# Patient Record
Sex: Female | Born: 1950
Health system: Southern US, Community
[De-identification: ages and names within clinical notes are randomized; demographics above are authoritative.]

## PROBLEM LIST (undated history)

## (undated) DIAGNOSIS — R030 Elevated blood-pressure reading, without diagnosis of hypertension: Secondary | ICD-10-CM

## (undated) DIAGNOSIS — Z973 Presence of spectacles and contact lenses: Secondary | ICD-10-CM

## (undated) DIAGNOSIS — Z9889 Other specified postprocedural states: Secondary | ICD-10-CM

## (undated) DIAGNOSIS — D649 Anemia, unspecified: Secondary | ICD-10-CM

## (undated) DIAGNOSIS — Z87410 Personal history of cervical dysplasia: Secondary | ICD-10-CM

## (undated) DIAGNOSIS — I2510 Atherosclerotic heart disease of native coronary artery without angina pectoris: Secondary | ICD-10-CM

## (undated) DIAGNOSIS — F419 Anxiety disorder, unspecified: Secondary | ICD-10-CM

## (undated) DIAGNOSIS — M810 Age-related osteoporosis without current pathological fracture: Secondary | ICD-10-CM

## (undated) DIAGNOSIS — H9190 Unspecified hearing loss, unspecified ear: Secondary | ICD-10-CM

## (undated) DIAGNOSIS — R112 Nausea with vomiting, unspecified: Secondary | ICD-10-CM

## (undated) DIAGNOSIS — E785 Hyperlipidemia, unspecified: Secondary | ICD-10-CM

## (undated) DIAGNOSIS — C44619 Basal cell carcinoma of skin of left upper limb, including shoulder: Secondary | ICD-10-CM

## (undated) DIAGNOSIS — S329XXA Fracture of unspecified parts of lumbosacral spine and pelvis, initial encounter for closed fracture: Secondary | ICD-10-CM

## (undated) DIAGNOSIS — Z5189 Encounter for other specified aftercare: Secondary | ICD-10-CM

## (undated) DIAGNOSIS — M8580 Other specified disorders of bone density and structure, unspecified site: Secondary | ICD-10-CM

## (undated) DIAGNOSIS — N871 Moderate cervical dysplasia: Secondary | ICD-10-CM

## (undated) DIAGNOSIS — Z9081 Acquired absence of spleen: Secondary | ICD-10-CM

## (undated) DIAGNOSIS — Z85828 Personal history of other malignant neoplasm of skin: Secondary | ICD-10-CM

## (undated) DIAGNOSIS — Z8249 Family history of ischemic heart disease and other diseases of the circulatory system: Secondary | ICD-10-CM

## (undated) DIAGNOSIS — Z862 Personal history of diseases of the blood and blood-forming organs and certain disorders involving the immune mechanism: Secondary | ICD-10-CM

## (undated) DIAGNOSIS — F32A Depression, unspecified: Secondary | ICD-10-CM

## (undated) DIAGNOSIS — N870 Mild cervical dysplasia: Secondary | ICD-10-CM

## (undated) DIAGNOSIS — N2581 Secondary hyperparathyroidism of renal origin: Secondary | ICD-10-CM

## (undated) DIAGNOSIS — L309 Dermatitis, unspecified: Secondary | ICD-10-CM

## (undated) DIAGNOSIS — Z8781 Personal history of (healed) traumatic fracture: Secondary | ICD-10-CM

## (undated) DIAGNOSIS — A310 Pulmonary mycobacterial infection: Secondary | ICD-10-CM

## (undated) DIAGNOSIS — C44712 Basal cell carcinoma of skin of right lower limb, including hip: Secondary | ICD-10-CM

## (undated) DIAGNOSIS — Z8639 Personal history of other endocrine, nutritional and metabolic disease: Secondary | ICD-10-CM

## (undated) HISTORY — DX: Elevated blood-pressure reading, without diagnosis of hypertension: R03.0

## (undated) HISTORY — DX: Age-related osteoporosis without current pathological fracture: M81.0

## (undated) HISTORY — DX: Anemia, unspecified: D64.9

## (undated) HISTORY — DX: Fracture of unspecified parts of lumbosacral spine and pelvis, initial encounter for closed fracture: S32.9XXA

## (undated) HISTORY — DX: Mild cervical dysplasia: N87.0

## (undated) HISTORY — DX: Anxiety disorder, unspecified: F41.9

## (undated) HISTORY — DX: Other specified disorders of bone density and structure, unspecified site: M85.80

## (undated) HISTORY — DX: Basal cell carcinoma of skin of left upper limb, including shoulder: C44.619

## (undated) HISTORY — DX: Pulmonary mycobacterial infection: A31.0

## (undated) HISTORY — PX: FRACTURE SURGERY: SHX138

## (undated) HISTORY — PX: BASAL CELL CARCINOMA EXCISION: SHX1214

## (undated) HISTORY — DX: Encounter for other specified aftercare: Z51.89

## (undated) HISTORY — DX: Atherosclerotic heart disease of native coronary artery without angina pectoris: I25.10

## (undated) HISTORY — DX: Hyperlipidemia, unspecified: E78.5

## (undated) HISTORY — DX: Secondary hyperparathyroidism of renal origin: N25.81

## (undated) HISTORY — DX: Basal cell carcinoma of skin of right lower limb, including hip: C44.712

---
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
## 1989-02-24 HISTORY — PX: MENISCUS REPAIR: SHX5179

## 1989-02-24 HISTORY — PX: KNEE SURGERY: SHX244

## 1993-02-24 HISTORY — PX: TUBAL LIGATION: SHX77

## 2003-10-05 ENCOUNTER — Other Ambulatory Visit: Admission: RE | Admit: 2003-10-05 | Discharge: 2003-10-05 | Payer: Self-pay | Admitting: Obstetrics and Gynecology

## 2004-01-17 ENCOUNTER — Encounter: Admission: RE | Admit: 2004-01-17 | Discharge: 2004-01-17 | Payer: Self-pay | Admitting: Obstetrics and Gynecology

## 2004-10-17 ENCOUNTER — Other Ambulatory Visit: Admission: RE | Admit: 2004-10-17 | Discharge: 2004-10-17 | Payer: Self-pay | Admitting: Obstetrics and Gynecology

## 2005-02-21 ENCOUNTER — Encounter: Admission: RE | Admit: 2005-02-21 | Discharge: 2005-02-21 | Payer: Self-pay | Admitting: Obstetrics and Gynecology

## 2005-10-24 ENCOUNTER — Other Ambulatory Visit: Admission: RE | Admit: 2005-10-24 | Discharge: 2005-10-24 | Payer: Self-pay | Admitting: Obstetrics & Gynecology

## 2006-03-13 ENCOUNTER — Encounter: Admission: RE | Admit: 2006-03-13 | Discharge: 2006-03-13 | Payer: Self-pay | Admitting: Obstetrics and Gynecology

## 2006-11-13 ENCOUNTER — Other Ambulatory Visit: Admission: RE | Admit: 2006-11-13 | Discharge: 2006-11-13 | Payer: Self-pay | Admitting: Obstetrics and Gynecology

## 2007-03-16 ENCOUNTER — Encounter: Admission: RE | Admit: 2007-03-16 | Discharge: 2007-03-16 | Payer: Self-pay | Admitting: Obstetrics and Gynecology

## 2007-11-19 ENCOUNTER — Other Ambulatory Visit: Admission: RE | Admit: 2007-11-19 | Discharge: 2007-11-19 | Payer: Self-pay | Admitting: Obstetrics and Gynecology

## 2008-03-17 ENCOUNTER — Encounter: Admission: RE | Admit: 2008-03-17 | Discharge: 2008-03-17 | Payer: Self-pay | Admitting: Obstetrics and Gynecology

## 2009-03-23 ENCOUNTER — Encounter: Admission: RE | Admit: 2009-03-23 | Discharge: 2009-03-23 | Payer: Self-pay | Admitting: Obstetrics and Gynecology

## 2010-03-25 ENCOUNTER — Encounter
Admission: RE | Admit: 2010-03-25 | Discharge: 2010-03-25 | Payer: Self-pay | Source: Home / Self Care | Attending: Obstetrics and Gynecology | Admitting: Obstetrics and Gynecology

## 2011-02-25 DIAGNOSIS — Z87410 Personal history of cervical dysplasia: Secondary | ICD-10-CM

## 2011-02-25 DIAGNOSIS — Z8781 Personal history of (healed) traumatic fracture: Secondary | ICD-10-CM

## 2011-02-25 HISTORY — DX: Personal history of cervical dysplasia: Z87.410

## 2011-02-25 HISTORY — DX: Personal history of (healed) traumatic fracture: Z87.81

## 2011-03-14 ENCOUNTER — Other Ambulatory Visit: Payer: Self-pay | Admitting: Obstetrics and Gynecology

## 2011-03-14 DIAGNOSIS — Z1231 Encounter for screening mammogram for malignant neoplasm of breast: Secondary | ICD-10-CM

## 2011-04-15 ENCOUNTER — Ambulatory Visit
Admission: RE | Admit: 2011-04-15 | Discharge: 2011-04-15 | Disposition: A | Discharge status: Home / Self Care | Payer: 59 | Source: Ambulatory Visit | Attending: Obstetrics and Gynecology | Admitting: Obstetrics and Gynecology

## 2011-04-15 DIAGNOSIS — Z1231 Encounter for screening mammogram for malignant neoplasm of breast: Secondary | ICD-10-CM

## 2011-07-19 ENCOUNTER — Emergency Department (HOSPITAL_COMMUNITY): Payer: 59

## 2011-07-19 ENCOUNTER — Emergency Department (HOSPITAL_COMMUNITY)
Admission: EM | Admit: 2011-07-19 | Discharge: 2011-07-19 | Disposition: A | Discharge status: Home / Self Care | Payer: 59 | Attending: Emergency Medicine | Admitting: Emergency Medicine

## 2011-07-19 ENCOUNTER — Encounter (HOSPITAL_COMMUNITY): Payer: Self-pay | Admitting: Emergency Medicine

## 2011-07-19 DIAGNOSIS — S32810A Multiple fractures of pelvis with stable disruption of pelvic ring, initial encounter for closed fracture: Secondary | ICD-10-CM | POA: Insufficient documentation

## 2011-07-19 DIAGNOSIS — M25559 Pain in unspecified hip: Secondary | ICD-10-CM | POA: Insufficient documentation

## 2011-07-19 DIAGNOSIS — R29898 Other symptoms and signs involving the musculoskeletal system: Secondary | ICD-10-CM | POA: Insufficient documentation

## 2011-07-19 DIAGNOSIS — S3282XA Multiple fractures of pelvis without disruption of pelvic ring, initial encounter for closed fracture: Secondary | ICD-10-CM

## 2011-07-19 DIAGNOSIS — W010XXA Fall on same level from slipping, tripping and stumbling without subsequent striking against object, initial encounter: Secondary | ICD-10-CM | POA: Insufficient documentation

## 2011-07-19 DIAGNOSIS — M79609 Pain in unspecified limb: Secondary | ICD-10-CM | POA: Insufficient documentation

## 2011-07-19 MED ORDER — HYDROCODONE-ACETAMINOPHEN 5-325 MG PO TABS: 1.0000 | ORAL_TABLET | Freq: Four times a day (QID) | ORAL | Status: AC | PRN

## 2011-07-19 MED ORDER — HYDROCODONE-ACETAMINOPHEN 5-325 MG PO TABS
1.0000 | ORAL_TABLET | Freq: Once | ORAL | Status: AC
  Administered 2011-07-19: 1 via ORAL

## 2011-07-19 MED ORDER — HYDROCODONE-ACETAMINOPHEN 5-325 MG PO TABS
ORAL_TABLET | ORAL | Status: AC
  Filled 2011-07-19: qty 1

## 2011-07-19 MED ORDER — LORAZEPAM 1 MG PO TABS
1.0000 mg | ORAL_TABLET | Freq: Once | ORAL | Status: AC
  Administered 2011-07-19: 1 mg via ORAL
  Filled 2011-07-19: qty 1

## 2011-07-19 NOTE — ED Notes (Signed)
EMS brings pt from home, pt fell last Friday pt states she was walking and tripped over the feet of the rocking chair, pt states she pulled her right groin, pt called PMD who called in a prescription for Flexeril and instructed her to take 800 mg of Ibuprofen. Pt has not been able to walk down stairs since fall secondary to pain. Pt states her right leg is still weak and painful with ambulation.

## 2011-07-19 NOTE — ED Notes (Signed)
MD at bedside. 

## 2011-07-19 NOTE — ED Notes (Signed)
[id number]<AV> Expected date:<BR> Expected time: 1:39 PM<BR> Means of arrival:<BR> Comments:<BR> M31 - 66yoF Fall, side pain

## 2011-07-19 NOTE — ED Notes (Signed)
Followed up with [facility].  They had not yet recived the patient's information. Information resubmitted, and w/c to arrive here in the ED. Spoke with Lurena Joiner at [phone]

## 2011-07-19 NOTE — ED Provider Notes (Signed)
History     CSN: [id number]  Arrival date & time 07/19/11  1351   First MD Initiated Contact with Patient 07/19/11 1458      Chief Complaint  Patient presents with  . Fall    (Consider location/radiation/quality/duration/timing/severity/associated sxs/prior treatment) HPI The patient presents one week after a fall with persistent pain and weakness.  She notes that she slipped, fell into a splits-like position.  She initially felt pain in her most prominently ascending through her midline to her head, lightheadedness.  Since that time she said pain prominently in her mid anterior pelvis, with radiation down the right anterior medial thigh.  The pain is worse with motion, ambulation.  She notes that her pain has diminished minimally with OTC medication and ice.  She continues to be uncomfortable.  No fevers, chills, dysuria, confusion, disorientation, incontinence, bowel habit changes No past medical history on file.  Past Surgical History  Procedure Date  . Splenectomy, total     Family History  Problem Relation Age of Onset  . Heart failure Father     History  Substance Use Topics  . Smoking status: Former Games developer  . Smokeless tobacco: Never Used  . Alcohol Use: Not on file     per month    OB History    Grav Para Term Preterm Abortions TAB SAB Ect Mult Living                  Review of Systems  All other systems reviewed and are negative.    Allergies  Sulfa antibiotics and Penicillins  Home Medications   Current Outpatient Rx  Name Route Sig Dispense Refill  . VITAMIN D 1000 UNITS PO TABS Oral Take 1,000 Units by mouth daily.    Marland Kitchen CLONAZEPAM 0.5 MG PO TABS Oral Take 0.5 mg by mouth 2 (two) times daily as needed. Panic attacks    . CYCLOBENZAPRINE HCL 5 MG PO TABS Oral Take 5 mg by mouth 3 (three) times daily as needed. Muscle spasms    . IBUPROFEN 200 MG PO TABS Oral Take 800 mg by mouth every 8 (eight) hours as needed. pain    . NAPROXEN SODIUM 220 MG PO  TABS Oral Take 220 mg by mouth 2 (two) times daily with a meal.      BP 178/83  Pulse 87  Temp 98.7 F (37.1 C)  SpO2 97%  Physical Exam  Nursing note and vitals reviewed. Constitutional: She is oriented to person, place, and time. She appears well-developed and well-nourished. No distress.  HENT:  Head: Normocephalic and atraumatic.  Eyes: Conjunctivae and EOM are normal.  Cardiovascular: Normal rate and regular rhythm.   Pulmonary/Chest: Effort normal and breath sounds normal. No stridor. No respiratory distress.  Abdominal: She exhibits no distension.  Musculoskeletal: She exhibits no edema.       Legs:      Patient can minimally elevate both legs, w no strength against resistance.  Hip extension is less profoundly affected, but is weak.  Knees / ankles are 5/5 in all dimensions.  Neurological: She is alert and oriented to person, place, and time. No cranial nerve deficit.  Skin: Skin is warm and dry.  Psychiatric: She has a normal mood and affect.    ED Course  Procedures (including critical care time)  Labs Reviewed - No data to display No results found.   No diagnosis found.    MDM  This 60 year old female presents following a fall of ongoing lower  extremity pain.  Patient has minimal capacity to flex either hip, but is otherwise neurovascularly intact.  Range of motion is limited notably 2 to pain in the pelvis with both extremities.  The patient has no other focal neuro changes.  The patient's radiographic studies demonstrate 5 fractures within the pelvis.  I discussed the findings with our orthopedist on call.  Given the significance of her fractures, although no operative management is currently indicated, she does require a wheelchair on discharge.  She was discharged in stable condition following a prolonged discussion on home care  Gerhard Munch, MD 07/19/11 1819

## 2012-06-04 ENCOUNTER — Encounter: Payer: Self-pay | Admitting: Obstetrics & Gynecology

## 2012-06-07 ENCOUNTER — Encounter: Payer: Self-pay | Admitting: Obstetrics & Gynecology

## 2012-06-07 ENCOUNTER — Ambulatory Visit (INDEPENDENT_AMBULATORY_CARE_PROVIDER_SITE_OTHER): Payer: 59 | Admitting: Obstetrics & Gynecology

## 2012-06-07 VITALS — BP 142/86 | Ht 64.0 in | Wt 163.0 lb

## 2012-06-07 DIAGNOSIS — M810 Age-related osteoporosis without current pathological fracture: Secondary | ICD-10-CM

## 2012-06-07 DIAGNOSIS — Z0000 Encounter for general adult medical examination without abnormal findings: Secondary | ICD-10-CM

## 2012-06-07 DIAGNOSIS — Z01419 Encounter for gynecological examination (general) (routine) without abnormal findings: Secondary | ICD-10-CM

## 2012-06-07 NOTE — Patient Instructions (Signed)

## 2012-06-07 NOTE — Progress Notes (Signed)
62 y.o. G3P3000 WidowedCaucasianF here for annual exam.  No vaginal bleeding.  Dating same person--together six years.  He has AML and now has liver metastasis and is in clinical trial in [place], [place].  Questions about doing yearly MMG, BMD, and Paps.  She doesn't want to be on much medication for the osteoporosis in her spine.  Has not had a colonoscopy.  Feeling very anxious today.  Had six bowel movements today.  Willing to do occult testing for blood.  Still declines colonoscopy despite discussion about importance today.  No LMP recorded. Patient is postmenopausal.          Sexually active: yes/no partner is dealing with medical issues The current method of family planning is vasectomy.    Exercising: yes  aerobic weight lifting 2-3/wk Smoker:  no Alcohol: Drinks 1x/month  Health Maintenance: Pap:  11/05/11 LGSIL and +HR HPV MMG:  04/15/11 Colonoscopy:  none BMD:   02/2010 TDaP:  2010 Labs: will do today    reports that she has quit smoking. She has never used smokeless tobacco. She reports that she drinks about 1.2 ounces of alcohol per week. She reports that she does not use illicit drugs.  Past Medical History  Diagnosis Date  . Pelvic fracture     from fall  . Osteopenia   . Dysplasia of cervix, low grade (CIN 1) 05/08/11    Past Surgical History  Procedure Laterality Date  . Splenectomy, total  1954    due to spherocytosis  . Tubal ligation  1995  . Meniscus repair Right 1991    Current Outpatient Prescriptions  Medication Sig Dispense Refill  . cholecalciferol (VITAMIN D) 1000 UNITS tablet Take 1,000 Units by mouth daily.      . clonazePAM (KLONOPIN) 0.5 MG tablet Take 0.5 mg by mouth 2 (two) times daily as needed. Panic attacks       No current facility-administered medications for this visit.    Family History  Problem Relation Age of Onset  . Heart failure Father   . Skin cancer Father   . Hypertension Father   . Osteoporosis Mother     ROS:  Pertinent  items are noted in HPI.  Otherwise, a comprehensive ROS was negative.  Exam:   BP 142/86  Ht 5\' 4"  (1.626 m)  Wt 163 lb (73.936 kg)  BMI 27.97 kg/m2  Height:   Height: 5\' 4"  (162.6 cm)  Ht Readings from Last 3 Encounters:  06/07/12 5\' 4"  (1.626 m)    General appearance: alert, cooperative and appears stated age Head: Normocephalic, without obvious abnormality, atraumatic Neck: no adenopathy, supple, symmetrical, trachea midline and thyroid normal to inspection and palpation Lungs: clear to auscultation bilaterally Breasts: normal appearance, no masses or tenderness Heart: regular rate and rhythm Abdomen: soft, non-tender; bowel sounds normal; no masses,  no organomegaly Extremities: extremities normal, atraumatic, no cyanosis or edema Skin: Skin color, texture, turgor normal. No rashes or lesions Lymph nodes: Cervical, supraclavicular, and axillary nodes normal. No abnormal inguinal nodes palpated Neurologic: Grossly normal   Pelvic: External genitalia:  no lesions              Urethra:  normal appearing urethra with no masses, tenderness or lesions              Bartholins and Skenes: normal                 Vagina: normal appearing vagina with normal color and discharge, no lesions  Cervix: no lesions              Pap taken: yes Bimanual Exam:  Uterus:  normal size, contour, position, consistency, mobility, non-tender              Adnexa: no mass, fullness, tenderness               Rectovaginal: Confirms               Anus:  normal sphincter tone, no lesions  A:  Well Woman with normal exam H/O LGSIL pap with +HR HPV Osteoporosis Anxiety H/O elevated parathyroid hormone level   P:   MMG and BMD this year--pt to schedule Pap and HR HPV today. Order for Zostavax given oday.  She will do at local pharmacy Vitamin D, Lipids, CMP, U/A, Parathyroid hormone.  Patient will return for these, fasting. OC light given.  Declines colonoscopy today.  An After Visit  Summary was printed and given to the patient.

## 2012-06-11 LAB — IPS PAP TEST WITH HPV

## 2012-06-15 NOTE — Addendum Note (Signed)
Addended by: Jerene Bears on: 06/15/2012 12:57 PM   Modules accepted: Orders

## 2012-06-17 ENCOUNTER — Ambulatory Visit (INDEPENDENT_AMBULATORY_CARE_PROVIDER_SITE_OTHER): Payer: 59 | Admitting: Obstetrics & Gynecology

## 2012-06-17 ENCOUNTER — Other Ambulatory Visit: Payer: 59

## 2012-06-17 VITALS — BP 122/82 | HR 64 | Resp 16

## 2012-06-17 DIAGNOSIS — Z0000 Encounter for general adult medical examination without abnormal findings: Secondary | ICD-10-CM

## 2012-06-17 DIAGNOSIS — R87612 Low grade squamous intraepithelial lesion on cytologic smear of cervix (LGSIL): Secondary | ICD-10-CM

## 2012-06-17 DIAGNOSIS — M810 Age-related osteoporosis without current pathological fracture: Secondary | ICD-10-CM

## 2012-06-17 DIAGNOSIS — R87810 Cervical high risk human papillomavirus (HPV) DNA test positive: Secondary | ICD-10-CM

## 2012-06-17 LAB — POCT URINALYSIS DIPSTICK
Bilirubin, UA: NEGATIVE
Nitrite, UA: NEGATIVE
Protein, UA: NEGATIVE
Urobilinogen, UA: NEGATIVE
pH, UA: 5

## 2012-06-17 NOTE — Progress Notes (Signed)
62 y.o. WidowedCaucasianfemale here for colposcopy. Pap done on 06/07/12 showed LSGIL and HR HPV.  Colpo one year ago showed CIN I with biopsy.  Patient's last menstrual period was 02/24/2002.  Contraception:  postmenopausal status   Blood pressure 122/82, pulse 64, resp. rate 16, last menstrual period 02/24/2002.  Procedure explained and patient's questions were invited and answered.  Consent form signed.    Speculum inserted atraumatically and cervix visualized.  3% acetic acid applied.  Cervix examined using 7.5 and 15x magnification and green filter.  Lugol's solution also applied with no abnormal staining.  Gross appearance:normal.  Squamocolumnar junction seen in entirety: yes, just endocervical.  No abnormal lesions noted.  ECC obtained.  Patient tolerated procedure well.   Assessment:  LGSIL, H/O CIN I, +HR HPV  Plan:  Will base further treatment on Pathology findings.

## 2012-06-17 NOTE — Patient Instructions (Signed)

## 2012-06-18 LAB — LIPID PANEL
Cholesterol: 189 mg/dL (ref 0–200)
HDL: 72 mg/dL (ref >39)
LDL Cholesterol: 103 mg/dL — ABNORMAL HIGH (ref 0–99)
Total CHOL/HDL Ratio: 2.6 Ratio
Triglycerides: 68 mg/dL (ref <150)
VLDL: 14 mg/dL (ref 0–40)

## 2012-06-19 LAB — VITAMIN D 25 HYDROXY (VIT D DEFICIENCY, FRACTURES): Vit D, 25-Hydroxy: 26 ng/mL — ABNORMAL LOW (ref 30–89)

## 2012-06-21 LAB — PTH, INTACT AND CALCIUM
Calcium, Total (PTH): 9.7 mg/dL (ref 8.4–10.5)
PTH: 70.2 pg/mL (ref 14.0–72.0)

## 2012-06-24 ENCOUNTER — Telehealth: Payer: Self-pay | Admitting: Obstetrics & Gynecology

## 2012-06-24 NOTE — Telephone Encounter (Signed)
Returning call to Kelly

## 2012-06-28 ENCOUNTER — Encounter: Payer: Self-pay | Admitting: Obstetrics & Gynecology

## 2012-06-28 NOTE — Telephone Encounter (Signed)
RETURNING A CALL TO KELLY

## 2012-08-17 ENCOUNTER — Other Ambulatory Visit: Payer: Self-pay

## 2012-08-17 DIAGNOSIS — Z1231 Encounter for screening mammogram for malignant neoplasm of breast: Secondary | ICD-10-CM

## 2012-08-25 ENCOUNTER — Ambulatory Visit: Payer: 59

## 2012-09-03 ENCOUNTER — Ambulatory Visit: Payer: 59

## 2012-09-07 ENCOUNTER — Telehealth: Payer: Self-pay | Admitting: Obstetrics & Gynecology

## 2012-09-07 NOTE — Telephone Encounter (Signed)
Patient is asking for an order to be faxed to [facility] for bmd and mmg.

## 2012-09-08 NOTE — Telephone Encounter (Signed)
Both are fine.

## 2012-09-08 NOTE — Telephone Encounter (Signed)
Patient last AEX 06/07/2012 per EPIC/ DR. Hyacinth Wilkerson Last Diagnostic Bilateral Mammogram  04/15/2011 @ [facility]  Last BMD 03/25/2010 @ [facility] for recheck in 2 years Patient requesting order to [facility] for BMD and Diagnostic Mammogram Please advise. Chart in your office if needed

## 2012-09-09 ENCOUNTER — Other Ambulatory Visit: Payer: Self-pay | Admitting: Orthopedic Surgery

## 2012-09-09 DIAGNOSIS — Z780 Asymptomatic menopausal state: Secondary | ICD-10-CM

## 2012-09-09 NOTE — Telephone Encounter (Signed)
Spoke with pt about order for BMD placed at [facility], and that her last MMG recommended just a screening MMG for this year. Pt can call and schedule at her convenience. Pt agreeable.

## 2012-09-24 ENCOUNTER — Ambulatory Visit
Admission: RE | Admit: 2012-09-24 | Discharge: 2012-09-24 | Disposition: A | Discharge status: Home / Self Care | Payer: 59 | Source: Ambulatory Visit | Attending: Obstetrics and Gynecology | Admitting: Obstetrics and Gynecology

## 2012-09-24 ENCOUNTER — Ambulatory Visit
Admission: RE | Admit: 2012-09-24 | Discharge: 2012-09-24 | Disposition: A | Discharge status: Home / Self Care | Payer: 59 | Source: Ambulatory Visit

## 2012-09-24 DIAGNOSIS — Z1231 Encounter for screening mammogram for malignant neoplasm of breast: Secondary | ICD-10-CM

## 2012-09-24 DIAGNOSIS — Z780 Asymptomatic menopausal state: Secondary | ICD-10-CM

## 2012-10-08 ENCOUNTER — Telehealth: Payer: Self-pay | Admitting: *Deleted

## 2012-10-08 NOTE — Telephone Encounter (Signed)
Left message on CB# of need to call office concerning bone density report and medication Bonivia.

## 2012-10-11 ENCOUNTER — Telehealth: Payer: Self-pay | Admitting: Obstetrics & Gynecology

## 2012-10-11 NOTE — Telephone Encounter (Signed)
Spoke with pt about BMD results. Pt would like for SM to review her results and decide what to do next re: Boniva. Pt will be in all day meetings for the next 3 days (Tues, Wed, and Thurs), but we can leave a detailed message on her phone.

## 2012-10-11 NOTE — Telephone Encounter (Signed)
Patient's Dr. Is Dr. Hyacinth Meeker would like someone that is with Dr. Hyacinth Meeker to call her back concerning her Bone density.

## 2012-10-11 NOTE — Telephone Encounter (Signed)
Left message on CB#VM of need to call office. Left message that Patty Grubb,FNP, reviewed her BMD results for 09/24/2012 and had question concerning the need for Boniva.

## 2012-10-15 NOTE — Telephone Encounter (Signed)
Please have her come in for OV.  She will have lots of questions.  OV with me please.

## 2012-10-18 ENCOUNTER — Telehealth: Payer: Self-pay | Admitting: Obstetrics & Gynecology

## 2012-10-18 NOTE — Telephone Encounter (Signed)
Patient states she spoke toDr Hyacinth Meeker personally and appt 12-03-12 for AEX.

## 2012-10-18 NOTE — Telephone Encounter (Signed)
Discussed results with patient.  Options discussed with patient.  She would like to recheck BMD 2 years.  Agreeable with plan.

## 2012-12-03 ENCOUNTER — Ambulatory Visit (INDEPENDENT_AMBULATORY_CARE_PROVIDER_SITE_OTHER): Payer: 59 | Admitting: Obstetrics & Gynecology

## 2012-12-03 VITALS — BP 142/80 | HR 64 | Resp 16 | Ht 64.0 in | Wt 158.2 lb

## 2012-12-03 DIAGNOSIS — IMO0002 Reserved for concepts with insufficient information to code with codable children: Secondary | ICD-10-CM

## 2012-12-03 DIAGNOSIS — R6889 Other general symptoms and signs: Secondary | ICD-10-CM

## 2012-12-03 DIAGNOSIS — Z800 Family history of malignant neoplasm of digestive organs: Secondary | ICD-10-CM

## 2012-12-03 NOTE — Patient Instructions (Signed)
AZO standard (OTC) three times a day for three days.  If you see bright orange on your mini-pad, it is urine.

## 2012-12-08 ENCOUNTER — Telehealth: Payer: Self-pay | Admitting: Obstetrics & Gynecology

## 2012-12-08 ENCOUNTER — Encounter: Payer: Self-pay | Admitting: Obstetrics & Gynecology

## 2012-12-08 DIAGNOSIS — IMO0002 Reserved for concepts with insufficient information to code with codable children: Secondary | ICD-10-CM | POA: Insufficient documentation

## 2012-12-08 NOTE — Progress Notes (Signed)
Patient ID: CHAPEL SILVERTHORN, female   DOB: January 07, 1951, 62 y.o.   MRN: [id number]  62 yo G3P3 WWF here for "checking in".  She has hx of abnormal Pap smears that I have followed a little over a year.  Tested positive last year for HR HPV.  Had AEX 4/14 and pap, again, was abnormal showing LGSIL. Colposcopy was performed 4/24.  No abnormalities were noted.  ECC was negative.  One year follow-up planned.  After colposcopy, I had a lengthy discussion on the phone with pt.  We reviewed all current guidelines.  Although she was ok with waiting a year, she wanted to check in in six months to make sure nothing had changed in regards to recommendations.  Nothing has changed and I reviewed all pap smears and pathology with pt.  We discussed if there continued to be abnormality next year, especially with +HR HPV, it would be appropriate to proceed with excision of TZ of cervix to decrease HPV volume and hopefully to see resolution of abnormal Pap smear.  Patient has to think and get her self ready and isn't sure yet if she will want that.  Also wants to tell me mother died with metastatic colon cancer.  Pt has refused colonoscopy in the past but thinks she will want one next year.  Encouraged pt to proceed with testing for stool in blood.  She is ok with this.  Stool card given today.  I really encouraged her to consider doing colonoscopy earlier.  She just isn't "mentally ready yet'.  Lastly, pt wants to talk about dark yellowish, scant discharge that she sees in underwear at end of day.  Is sticky and she thinks could be leakage of urine.  I am doubtful.  Advised typical PMP discharge that is seen.  Informed her to try AZO standard for 3 days to see if sees dark orange in underwear.  If so, she needs to let me know.  Declines exam today.  Assessment:  H/o abnormal Pap smear, H/o + HRHPV, mother with hx of colon cancer, PMP discharge  Plan:  Pap with AEX next year.  Appt already scheduled.  Stool card given and  colonoscopy encouraged.  ~30 minutes spent with patient >50% of time was in face to face discussion of above.

## 2012-12-08 NOTE — Telephone Encounter (Signed)
Patient calling for recent bone density results please?

## 2012-12-08 NOTE — Telephone Encounter (Addendum)
Dr. Hyacinth Meeker, patient is calling for results. Dexa in August showed osteoporosis, Dr. Hyacinth Meeker spoke with patient at that time but patient states that they forgot to discuss at recent appointment. She wanted to know actual numbers. T and Z scores given. Patient verbalized understanding and will f/u prn.

## 2013-03-09 ENCOUNTER — Telehealth: Payer: Self-pay | Admitting: Obstetrics & Gynecology

## 2013-03-09 NOTE — Telephone Encounter (Signed)
Routing to Dr. Miller

## 2013-03-09 NOTE — Telephone Encounter (Signed)
Patient was in in October to see [facility] for a consult and she told her a doctors name for a colonoscopy she doesn't remember the name of the doctor. She said "it was the one her(Dr miller) parents went to"

## 2013-03-09 NOTE — Telephone Encounter (Signed)
Dr. Collene Mares.  We will probably need to make a referral for her but if she wants to call and find out first, that is fine.

## 2013-03-10 NOTE — Telephone Encounter (Signed)
Return call to patient, VM has name confirmation. LM of Dr Collene Mares name and phone number.  Encounter closed.

## 2013-03-16 ENCOUNTER — Encounter: Payer: Self-pay | Admitting: Obstetrics & Gynecology

## 2013-06-08 ENCOUNTER — Telehealth: Payer: Self-pay | Admitting: Obstetrics & Gynecology

## 2013-06-08 NOTE — Telephone Encounter (Signed)
Pt says she is anticipating that she will need a colpo after her aex with dr Sabra Heck. Pt says she has new insurance and trying to have that done as soon as possible.

## 2013-06-08 NOTE — Telephone Encounter (Signed)
Dr. Sabra Heck, patient is currently covered by two health insurance policies until the 98XQ of this Month. She would like to come in earlier for AEX as soon as possible to have another pap (she understands this may not be covered under primary insurance as she has already had an annual this year) so that it is resulted and she will know earlier if she needs a colposcopy and so she can plan to have colposcopy prior to lapse of benefits.  There are no open aex appointments for you at this time. Patient has seen Milford Cage, [place] before on 04/2011. Will it be okay with you to schedule aex for earlier with Patty?

## 2013-06-09 ENCOUNTER — Ambulatory Visit (INDEPENDENT_AMBULATORY_CARE_PROVIDER_SITE_OTHER): Payer: 59 | Admitting: Obstetrics & Gynecology

## 2013-06-09 ENCOUNTER — Encounter: Payer: Self-pay | Admitting: Obstetrics & Gynecology

## 2013-06-09 VITALS — Ht 63.75 in | Wt 159.0 lb

## 2013-06-09 DIAGNOSIS — IMO0002 Reserved for concepts with insufficient information to code with codable children: Secondary | ICD-10-CM

## 2013-06-09 DIAGNOSIS — R899 Unspecified abnormal finding in specimens from other organs, systems and tissues: Secondary | ICD-10-CM

## 2013-06-09 DIAGNOSIS — Z0000 Encounter for general adult medical examination without abnormal findings: Secondary | ICD-10-CM

## 2013-06-09 DIAGNOSIS — R6889 Other general symptoms and signs: Secondary | ICD-10-CM

## 2013-06-09 DIAGNOSIS — Z01419 Encounter for gynecological examination (general) (routine) without abnormal findings: Secondary | ICD-10-CM

## 2013-06-09 LAB — HEPATIC FUNCTION PANEL
ALBUMIN: 4.5 g/dL (ref 3.5–5.2)
ALK PHOS: 78 U/L (ref 39–117)
ALT: 27 U/L (ref 0–35)
AST: 23 U/L (ref 0–37)
Bilirubin, Direct: 0.2 mg/dL (ref 0.0–0.3)
Indirect Bilirubin: 0.6 mg/dL (ref 0.2–1.2)
TOTAL PROTEIN: 7.2 g/dL (ref 6.0–8.3)
Total Bilirubin: 0.8 mg/dL (ref 0.2–1.2)

## 2013-06-09 LAB — POCT URINALYSIS DIPSTICK
Bilirubin, UA: NEGATIVE
Glucose, UA: NEGATIVE
Ketones, UA: NEGATIVE
NITRITE UA: NEGATIVE
UROBILINOGEN UA: NEGATIVE
pH, UA: 6

## 2013-06-09 NOTE — Progress Notes (Signed)
63 y.o. [id number] WidowedCaucasianF here for annual exam.  No vaginal bleeding.    Patient's last menstrual period was 02/24/2002.          Sexually active: no  The current method of family planning is tubal ligation.    Exercising: yes   Smoker:  no  Health Maintenance: Pap:  06/07/12-LGSIL, 06/17/12-Colposcopy History of abnormal Pap:  yes MMG:  09/24/12-normal Colonoscopy:  05/23/13-Repeat in 5 years BMD:   09/24/12, -1.8/-1.8 TDaP:  Up to date with PCP Dr Percell Shivan Hodes at [facility]: PCP, Hb today: PCP and with GI, Urine today: WBC-1+, PROTEIN-trace, PH-6, RBC-trace   reports that she has never smoked. She has never used smokeless tobacco. She reports that she drinks about 1.2 ounces of alcohol per week. She reports that she does not use illicit drugs.  Past Medical History  Diagnosis Date  . Pelvic fracture     from fall  . Osteopenia   . Dysplasia of cervix, low grade (CIN 1) 05/08/11  . Hyperparathyroidism, secondary     due to excess Vit D, normalized off Vit D, never treated    Past Surgical History  Procedure Laterality Date  . Splenectomy, total  1954    due to spherocytosis  . Tubal ligation  1995  . Meniscus repair Right 1991    Current Outpatient Prescriptions  Medication Sig Dispense Refill  . Emollient (NEOSALUS) CREA       . halobetasol (ULTRAVATE) 0.05 % ointment Apply 1 application topically 2 (two) times daily. prn      . clonazePAM (KLONOPIN) 0.5 MG tablet Take 0.5 mg by mouth 2 (two) times daily as needed. Panic attacks       No current facility-administered medications for this visit.    Family History  Problem Relation Age of Onset  . Heart failure Father   . Skin cancer Father   . Hypertension Father   . Osteoporosis Mother   . Colon cancer Mother     deceased    ROS:  Pertinent items are noted in HPI.  Otherwise, a comprehensive ROS was negative.  Exam:   Ht 5' 3.75" (1.619 m)  Wt 159 lb (72.122 kg)  BMI 27.52 kg/m2  LMP 02/24/2002     Height: 5' 3.75" (161.9 cm)  Ht Readings from Last 3 Encounters:  06/09/13 5' 3.75" (1.619 m)  12/03/12 5\' 4"  (1.626 m)  06/07/12 5\' 4"  (1.626 m)    General appearance: alert, cooperative and appears stated age Head: Normocephalic, without obvious abnormality, atraumatic Neck: no adenopathy, supple, symmetrical, trachea midline and thyroid normal to inspection and palpation Lungs: clear to auscultation bilaterally Breasts: normal appearance, no masses or tenderness Heart: regular rate and rhythm Abdomen: soft, non-tender; bowel sounds normal; no masses,  no organomegaly Extremities: extremities normal, atraumatic, no cyanosis or edema Skin: Skin color, texture, turgor normal. No rashes or lesions Lymph nodes: Cervical, supraclavicular, and axillary nodes normal. No abnormal inguinal nodes palpated Neurologic: Grossly normal   Pelvic: External genitalia:  no lesions              Urethra:  normal appearing urethra with no masses, tenderness or lesions              Bartholins and Skenes: normal                 Vagina: normal appearing vagina with normal color and discharge, no lesions              Cervix: no  lesions              Pap taken: yes Bimanual Exam:  Uterus:  normal size, contour, position, consistency, mobility, non-tender              Adnexa: normal adnexa and no mass, fullness, tenderness               Rectovaginal: Confirms               Anus:  normal sphincter tone, no lesions  A:  Well Woman with normal exam PMP, no HRT H/O LGSIL with +HR HPV H/O family hx of colon cancer, mother Osteopororsis.  Stable over 8 years.  Declines treatment.  P:   Mammogram yearly pap smear with HR HPV today Labs with PCP CMP return annually or prn  An After Visit Summary was printed and given to the patient.

## 2013-06-09 NOTE — Patient Instructions (Signed)

## 2013-06-09 NOTE — Telephone Encounter (Signed)
Spoke with patient. Patient is agreeable to come in for appointment today at 3:30 with Dr.Miller. Appointment scheduled. Patient states that she has not yet chosen benefit package for new insurance but knows that coverage began on day one of her starting new job. Patient is still covered with previous insurance until end of month. Advised to bring old insurance information as well as any new information from new insurance. Patient agreeable and verbalizes understanding.  Routing to provider for final review. Patient agreeable to disposition. Will close encounter

## 2013-06-09 NOTE — Telephone Encounter (Signed)
Call her and see if she can come this afternoon at 3:30pm.  Thanks.

## 2013-06-15 LAB — IPS PAP TEST WITH HPV

## 2013-06-17 ENCOUNTER — Ambulatory Visit: Payer: 59 | Admitting: Obstetrics & Gynecology

## 2013-06-20 ENCOUNTER — Telehealth: Payer: Self-pay | Admitting: Emergency Medicine

## 2013-06-20 DIAGNOSIS — R87810 Cervical high risk human papillomavirus (HPV) DNA test positive: Secondary | ICD-10-CM

## 2013-06-20 NOTE — Telephone Encounter (Signed)
Spoke with patient. Message from Dr. Sabra Heck given. Patient was planning on colposcopy for 4/28 at 1400 (this was her prior AEX appointment) as she currently has two insurance coverage's at this time and wants to have procedure. Advised that pap smear was normal but HPV remains positive, will need colposcopy for further evaluation.   Patient requests appointment confirmation and instructions to be left as detailed message as she is going into a meeting at this time.   Colposcopy pre-procedure instructions given. Motrin instructions given. Motrin=Advil=Ibuprofen Can take 800 mg (Can purchase over the counter, you will need four 200 mg pills).  Take with food. Make sure to eat a meal before appointment and drink plenty of fluids. Patient verbalized understanding and will call to reschedule if will be on menses or has any concerns regarding pregnancy. Advised will need to cancel within 24 hours or will have $100.00 no show fee placed to account.   Order sent for precert.

## 2013-06-20 NOTE — Telephone Encounter (Signed)
Colposcopy ordered for precert.  Post menopausal.  Pap normal, HR HPV postive.   Message left to return call to [place] at [phone].

## 2013-06-20 NOTE — Telephone Encounter (Signed)
Message copied by Michele Mcalpine on Mon Jun 20, 2013 10:23 AM ------      Message from: Megan Salon      Created: Wed Jun 15, 2013  3:59 PM       Inform pt pap is normal.  This is an improvement over the last pap but her HR HPV is still +.  She needs a colpo again this year. ------

## 2013-06-21 ENCOUNTER — Ambulatory Visit: Payer: Managed Care, Other (non HMO) | Admitting: Obstetrics & Gynecology

## 2013-06-21 ENCOUNTER — Ambulatory Visit: Payer: 59 | Admitting: Obstetrics & Gynecology

## 2013-06-21 ENCOUNTER — Telehealth: Payer: Self-pay | Admitting: *Deleted

## 2013-06-21 NOTE — Telephone Encounter (Signed)
Patient arrived for colpo procedure and had insurance issue. Appointment rescheduled to 06-23-13 to allow patient time to provide documentation of new primary coverage.  Colpo 06-23-13 at 0915.  Routing to provider for final review. Will close encounter

## 2013-06-23 ENCOUNTER — Ambulatory Visit (INDEPENDENT_AMBULATORY_CARE_PROVIDER_SITE_OTHER): Payer: Managed Care, Other (non HMO) | Admitting: Obstetrics & Gynecology

## 2013-06-23 VITALS — BP 128/78 | HR 64 | Resp 16 | Ht 63.75 in | Wt 158.8 lb

## 2013-06-23 DIAGNOSIS — R6889 Other general symptoms and signs: Secondary | ICD-10-CM

## 2013-06-23 DIAGNOSIS — IMO0002 Reserved for concepts with insufficient information to code with codable children: Secondary | ICD-10-CM

## 2013-06-23 DIAGNOSIS — B977 Papillomavirus as the cause of diseases classified elsewhere: Secondary | ICD-10-CM

## 2013-06-23 NOTE — Patient Instructions (Signed)

## 2013-06-23 NOTE — Progress Notes (Signed)
Subjective:     Patient ID: Alicia Wilkerson, female   DOB: December 21, 1950, 63 y.o.   MRN: [id number]  HPI 63 yo G2P3 here for colposcopy.  LGSIL/+HR HPV pap 3/13, 4/14.  Colposcopy with biopsies showed CIN 1.  Follow-up pap was neg this year but HR HPV was positive.  Here for repeat colposcopy.   Review of Systems  All other systems reviewed and are negative.      Objective:   Physical Exam  Constitutional: She is oriented to person, place, and time. She appears well-developed and well-nourished.  Genitourinary: Vagina normal.    Neurological: She is alert and oriented to person, place, and time.  Skin: Skin is warm and dry.  Psychiatric: She has a normal mood and affect.       Assessment:    H/O + HR HPV in pt with normal pap.     Plan:     ECC pending.  If negative, pt will need cotesting in one year.  She is comfortable with this and advised me to leave a very detailed message for her with results when they come back.

## 2013-06-27 LAB — IPS OTHER TISSUE BIOPSY

## 2013-11-03 ENCOUNTER — Other Ambulatory Visit: Payer: Self-pay

## 2013-11-03 DIAGNOSIS — Z1231 Encounter for screening mammogram for malignant neoplasm of breast: Secondary | ICD-10-CM

## 2013-11-17 ENCOUNTER — Encounter (INDEPENDENT_AMBULATORY_CARE_PROVIDER_SITE_OTHER): Payer: Self-pay

## 2013-11-17 ENCOUNTER — Ambulatory Visit
Admission: RE | Admit: 2013-11-17 | Discharge: 2013-11-17 | Disposition: A | Discharge status: Home / Self Care | Payer: Managed Care, Other (non HMO) | Source: Ambulatory Visit

## 2013-11-17 DIAGNOSIS — Z1231 Encounter for screening mammogram for malignant neoplasm of breast: Secondary | ICD-10-CM

## 2013-12-26 ENCOUNTER — Encounter: Payer: Self-pay | Admitting: Obstetrics & Gynecology

## 2014-04-17 ENCOUNTER — Telehealth: Payer: Self-pay | Admitting: Obstetrics & Gynecology

## 2014-04-28 ENCOUNTER — Ambulatory Visit (INDEPENDENT_AMBULATORY_CARE_PROVIDER_SITE_OTHER): Payer: BLUE CROSS/BLUE SHIELD | Admitting: Obstetrics & Gynecology

## 2014-04-28 ENCOUNTER — Encounter: Payer: Self-pay | Admitting: Obstetrics & Gynecology

## 2014-04-28 VITALS — BP 156/82 | HR 60 | Resp 16 | Ht 63.75 in | Wt 163.6 lb

## 2014-04-28 DIAGNOSIS — R899 Unspecified abnormal finding in specimens from other organs, systems and tissues: Secondary | ICD-10-CM

## 2014-04-28 DIAGNOSIS — Z124 Encounter for screening for malignant neoplasm of cervix: Secondary | ICD-10-CM

## 2014-04-28 DIAGNOSIS — Z0000 Encounter for general adult medical examination without abnormal findings: Secondary | ICD-10-CM

## 2014-04-28 DIAGNOSIS — Z01419 Encounter for gynecological examination (general) (routine) without abnormal findings: Secondary | ICD-10-CM

## 2014-04-28 LAB — COMPREHENSIVE METABOLIC PANEL
ALT: 48 U/L — AB (ref 0–35)
AST: 39 U/L — ABNORMAL HIGH (ref 0–37)
Albumin: 4.5 g/dL (ref 3.5–5.2)
Alkaline Phosphatase: 81 U/L (ref 39–117)
BUN: 15 mg/dL (ref 6–23)
CALCIUM: 9.7 mg/dL (ref 8.4–10.5)
CHLORIDE: 102 meq/L (ref 96–112)
CO2: 24 mEq/L (ref 19–32)
Creat: 0.76 mg/dL (ref 0.50–1.10)
GLUCOSE: 86 mg/dL (ref 70–99)
Potassium: 4.3 mEq/L (ref 3.5–5.3)
Sodium: 142 mEq/L (ref 135–145)
TOTAL PROTEIN: 7 g/dL (ref 6.0–8.3)
Total Bilirubin: 1.1 mg/dL (ref 0.2–1.2)

## 2014-04-28 LAB — POCT URINALYSIS DIPSTICK
BILIRUBIN UA: NEGATIVE
GLUCOSE UA: NEGATIVE
Ketones, UA: NEGATIVE
Nitrite, UA: NEGATIVE
UROBILINOGEN UA: NEGATIVE
pH, UA: 5

## 2014-04-28 LAB — LIPID PANEL
Cholesterol: 187 mg/dL (ref 0–200)
HDL: 84 mg/dL (ref >=46)
LDL CALC: 92 mg/dL (ref 0–99)
TRIGLYCERIDES: 57 mg/dL (ref <150)
Total CHOL/HDL Ratio: 2.2 Ratio
VLDL: 11 mg/dL (ref 0–40)

## 2014-04-28 LAB — HEMOGLOBIN, FINGERSTICK: Hemoglobin, fingerstick: 13.3 g/dL (ref 12.0–16.0)

## 2014-04-28 NOTE — Progress Notes (Signed)
64 y.o. [id number] WidowedCaucasianF here for annual exam.  No vaginal bleeding.  Doing well.    Did have a single sexual partner this year.  She is not worried about STD exposure.    Patient's last menstrual period was 02/24/2002.          Sexually active: No.  The current method of family planning is tubal ligation.    Exercising: Yes.    weights, cardio, and walking-when time allows Smoker:  no  Health Maintenance: Pap:  06/09/13 WNL/postitive HR HPV, ECC-negative 06/23/13 History of abnormal Pap:  yes MMG:  11/17/13-normal Colonoscopy:  05/23/13-repeat in 5 years BMD:   09/24/12.  Osteoporosis.  Pt declines therapy and we have decided not to repeat this year. TDaP:  2010 Screening Labs: today, Hb today: 13.3, Urine today: WBC-trace, PROTEIN-trace, RBC-trace   reports that she has never smoked. She has never used smokeless tobacco. She reports that she drinks alcohol. She reports that she does not use illicit drugs.  Past Medical History  Diagnosis Date  . Pelvic fracture     from fall  . Osteopenia   . Dysplasia of cervix, low grade (CIN 1) 05/08/11  . Hyperparathyroidism, secondary     due to excess Vit D, normalized off Vit D, never treated    Past Surgical History  Procedure Laterality Date  . Splenectomy, total  1954    due to spherocytosis  . Tubal ligation  1995  . Meniscus repair Right 1991    Current Outpatient Prescriptions  Medication Sig Dispense Refill  . clonazePAM (KLONOPIN) 0.5 MG tablet Take 0.5 mg by mouth 2 (two) times daily as needed. Panic attacks    . Emollient (NEOSALUS) CREA     . halobetasol (ULTRAVATE) 0.05 % ointment Apply 1 application topically 2 (two) times daily. prn     No current facility-administered medications for this visit.    Family History  Problem Relation Age of Onset  . Heart failure Father   . Skin cancer Father   . Hypertension Father   . Osteoporosis Mother   . Colon cancer Mother     deceased    ROS:  Pertinent items  are noted in HPI.  Otherwise, a comprehensive ROS was negative.  Exam:   BP 156/82 mmHg  Pulse 60  Resp 16  Ht 5' 3.75" (1.619 m)  Wt 163 lb 9.6 oz (74.208 kg)  BMI 28.31 kg/m2  LMP 02/24/2002   Height: 5' 3.75" (161.9 cm)  Ht Readings from Last 3 Encounters:  04/28/14 5' 3.75" (1.619 m)  06/23/13 5' 3.75" (1.619 m)  06/09/13 5' 3.75" (1.619 m)    General appearance: alert, cooperative and appears stated age Head: Normocephalic, without obvious abnormality, atraumatic Neck: no adenopathy, supple, symmetrical, trachea midline and thyroid normal to inspection and palpation Lungs: clear to auscultation bilaterally Breasts: normal appearance, no masses or tenderness Heart: regular rate and rhythm Abdomen: soft, non-tender; bowel sounds normal; no masses,  no organomegaly Extremities: extremities normal, atraumatic, no cyanosis or edema Skin: Skin color, texture, turgor normal. No rashes or lesions Lymph nodes: Cervical, supraclavicular, and axillary nodes normal. No abnormal inguinal nodes palpated Neurologic: Grossly normal   Pelvic: External genitalia:  no lesions              Urethra:  normal appearing urethra with no masses, tenderness or lesions              Bartholins and Skenes: normal  Vagina: normal appearing vagina with normal color and discharge, no lesions              Cervix: no lesions              Pap taken: Yes.   Bimanual Exam:  Uterus:  normal size, contour, position, consistency, mobility, non-tender              Adnexa: normal adnexa and no mass, fullness, tenderness               Rectovaginal: Confirms               Anus:  normal sphincter tone, no lesions  Chaperone was present for exam.  A:  Well Woman with normal exam PMP, no HRT H/O LGSIL with +HR HPV H/O family hx of colon cancer, mother Osteopororsis. Stable over 8 years. Declines treatment.  Will not repeat BMD this year.   P: Mammogram yearly pap smear with HR HPV  today CMP, TSH, Vit D, Lipids (pt had coffee with a little cream this morning) Plan BMD next year return annually or prn

## 2014-04-29 LAB — VITAMIN D 25 HYDROXY (VIT D DEFICIENCY, FRACTURES): Vit D, 25-Hydroxy: 25 ng/mL — ABNORMAL LOW (ref 30–100)

## 2014-04-29 LAB — TSH: TSH: 1.144 u[IU]/mL (ref 0.350–4.500)

## 2014-05-01 NOTE — Addendum Note (Signed)
Addended by: Megan Salon on: 05/01/2014 09:31 PM   Modules accepted: Orders, SmartSet

## 2014-05-02 LAB — IPS PAP TEST WITH HPV

## 2014-05-08 ENCOUNTER — Telehealth: Payer: Self-pay

## 2014-05-08 NOTE — Telephone Encounter (Signed)
Pt returning call.  Ok to leave a detailed message.

## 2014-05-08 NOTE — Telephone Encounter (Signed)
-----   Message from Megan Salon, MD sent at 05/04/2014  5:59 PM EST ----- Inform pap showed ASCUS cells but HR HPV was negative.  OK to repeat cotest 1 year--Pap and RH HPV.  08 recall.

## 2014-05-08 NOTE — Telephone Encounter (Signed)
Lmtcb//kn 

## 2014-05-09 NOTE — Telephone Encounter (Signed)
Spoke with patient. Phone call passed from [facility] as patient had further questions about pap results. "I have had an abnormal pap before with HPV. Now there is not HPV detected. I want to know is this something that is like chicken pox and shingles? Do I need to worry about cancer cells?" Advised patient that her pap has negative HPV. Needs follow up with pap and HPV testing in one year. Advised at this time HPV is negative but I can not guarantee that it will be negative forever as she has had a positive HPV in the past. Advised this is why we will monitor her again in a year. Advised no signs of cancer at this time. Patient is agreeable and verbalizes understanding.

## 2014-05-09 NOTE — Telephone Encounter (Signed)
Patient notified of all results-see result note//kn

## 2014-10-13 ENCOUNTER — Telehealth: Payer: Self-pay | Admitting: *Deleted

## 2014-10-13 NOTE — Telephone Encounter (Signed)
Pt is aware of abnormality and of recommendation for follow up.  Non-compliance is noted.  Ok to close encounter.

## 2014-10-13 NOTE — Telephone Encounter (Signed)
Dr. Sabra Heck, I called patient and she declined coming back to have her labs rechecked. She said she felt fine and did not want to come all the way here from [place]. She said her work schedule was  too full as well. -eh

## 2014-10-13 NOTE — Telephone Encounter (Signed)
-----   Message from Megan Salon, MD sent at 10/11/2014  2:13 PM EDT ----- Regarding: repeat liver fxn tests Mrs. Swilling hasn't called back to scheduled repeat lab test.  Can you call to schedule this?  Thanks.  Order is in [place].  MSM

## 2014-11-09 ENCOUNTER — Telehealth: Payer: Self-pay | Admitting: Obstetrics & Gynecology

## 2014-11-09 ENCOUNTER — Other Ambulatory Visit: Payer: Self-pay

## 2014-11-09 DIAGNOSIS — E2839 Other primary ovarian failure: Secondary | ICD-10-CM

## 2014-11-09 DIAGNOSIS — Z780 Asymptomatic menopausal state: Secondary | ICD-10-CM

## 2014-11-09 DIAGNOSIS — Z1231 Encounter for screening mammogram for malignant neoplasm of breast: Secondary | ICD-10-CM

## 2014-11-09 NOTE — Telephone Encounter (Signed)
Spoke with patient. Advised last BMD was performed on 09/24/2012. Advised is due for another BMD at this time. Patient is agreeable and will call to schedule. Order for BMD sent to the [facility] for patient.  Routing to provider for final review. Patient agreeable to disposition. Will close encounter.

## 2014-11-09 NOTE — Telephone Encounter (Signed)
Patient is going to schedule her MMG soon and is wondering if she is due for her BMD also. Last seen 04/28/14.

## 2014-11-09 NOTE — Telephone Encounter (Signed)
Left message to call Kaitlyn at [phone]. 

## 2015-01-05 ENCOUNTER — Ambulatory Visit
Admission: RE | Admit: 2015-01-05 | Discharge: 2015-01-05 | Disposition: A | Discharge status: Home / Self Care | Payer: BLUE CROSS/BLUE SHIELD | Source: Ambulatory Visit | Attending: Obstetrics & Gynecology | Admitting: Obstetrics & Gynecology

## 2015-01-05 ENCOUNTER — Ambulatory Visit
Admission: RE | Admit: 2015-01-05 | Discharge: 2015-01-05 | Disposition: A | Discharge status: Home / Self Care | Payer: BLUE CROSS/BLUE SHIELD | Source: Ambulatory Visit

## 2015-01-05 DIAGNOSIS — E2839 Other primary ovarian failure: Secondary | ICD-10-CM

## 2015-01-05 DIAGNOSIS — Z780 Asymptomatic menopausal state: Secondary | ICD-10-CM

## 2015-01-05 DIAGNOSIS — Z1231 Encounter for screening mammogram for malignant neoplasm of breast: Secondary | ICD-10-CM

## 2015-01-08 ENCOUNTER — Telehealth: Payer: Self-pay | Admitting: *Deleted

## 2015-01-08 NOTE — Telephone Encounter (Signed)
-----   Message from Megan Salon, MD sent at 01/05/2015  5:38 PM EST ----- Please inform pt that her BMD continues to show osteoporosis in her spine.  t score -3.1 but was -3.2 with last test.  Hip still shows osteopenia.  Was -1.6 with this test and -1.8 two years ago.  She doesn't want treatment and as is stable, ok to monitor.  Repeat 2 years.

## 2015-01-08 NOTE — Telephone Encounter (Signed)
I spoke with patient and she is aware of her BMD results. She does not want treatment at this time. -eh

## 2015-01-08 NOTE — Telephone Encounter (Signed)
[place] in regards to BMD results -eh

## 2015-03-06 ENCOUNTER — Ambulatory Visit (INDEPENDENT_AMBULATORY_CARE_PROVIDER_SITE_OTHER): Payer: BLUE CROSS/BLUE SHIELD | Admitting: Physician Assistant

## 2015-03-06 ENCOUNTER — Ambulatory Visit (INDEPENDENT_AMBULATORY_CARE_PROVIDER_SITE_OTHER): Payer: BLUE CROSS/BLUE SHIELD

## 2015-03-06 VITALS — BP 124/68 | HR 95 | Temp 98.2°F | Resp 18

## 2015-03-06 DIAGNOSIS — M25572 Pain in left ankle and joints of left foot: Secondary | ICD-10-CM | POA: Diagnosis not present

## 2015-03-06 DIAGNOSIS — S82892A Other fracture of left lower leg, initial encounter for closed fracture: Secondary | ICD-10-CM

## 2015-03-06 DIAGNOSIS — L309 Dermatitis, unspecified: Secondary | ICD-10-CM

## 2015-03-06 MED ORDER — NEOSALUS EX CREA: 1.0000 "application " | TOPICAL_CREAM | Freq: Every day | CUTANEOUS | Status: DC | PRN

## 2015-03-06 MED ORDER — HALOBETASOL PROPIONATE 0.05 % EX OINT: 1.0000 "application " | TOPICAL_OINTMENT | Freq: Two times a day (BID) | CUTANEOUS | Status: DC

## 2015-03-06 MED ORDER — TRAMADOL HCL 50 MG PO TABS: 50.0000 mg | ORAL_TABLET | Freq: Three times a day (TID) | ORAL | Status: DC | PRN

## 2015-03-06 NOTE — Patient Instructions (Addendum)
[facility] in [place]  No weight on the ankle at all.  Show up at [facility] to see Dr Doran Durand 03/07/2015  [facility] will hopefully set up with a wheelchair and bedside commode.

## 2015-03-06 NOTE — Progress Notes (Addendum)
[facility] consulted to assist with dme.  Patient presented to Urgent Care center post fall sustaining injury to left ankle.  EDCM spoke to PA Weber who reports patient lives alone.  Patient's son unable to stay with patient, but patient's boyfriend will be able to stay with patient.  Patient has paid her neighbor to take her to the urgent care center and will be taking patient home.  PA has placed order for dme wheelchair and 3 in 1 commode for patient at home.  EDCM called and spoke to Homer at [facility] asking if wheelchair and 3 in 1 can be delivered to [facility] this evening before 8pm.  Ysidro Evert reports he is not sure but will send out to on call.  [facility] faxed over order for 3 in 1 commode and wheelchair to [facility] with confirmation of receipt.  EDCM has not heard from on call from [facility], so [facility] called Nichola Sizer of [employer]  [phone] who will deliver wheelchair to patient's home this evening.  EDCM provided Aaron Edelman with patient's phone number.  Aaron Edelman reports he does not have any three in one commodes at this time.  EDCM will call patient to inform her of this.  03/06/2015 A. Raghav Verrilli RNCM 1936pm EDCM called and spoke to patient. Patient reports she was on her way to the pharmacy to pick up some medications.  Neighbor is taking her home.  EDCM informed patient that Aaron Edelman of [employer] will be calling her this evening to deliver her a wheelchair this evening.  EDCM informed patient that [facility] does not have 3 in 1 commodes at this time.  EDCM offered to fax out referral for commode to another home health agency, but patient refused as she reports she will not be able to empty it.  Patient reports with the wheelchair she will be able to to be mobile, get to the microwave and sink and bathroom, "I will manage." Patient reports she has a walker at home.  Patient reports her neighbor will be taking her to her appointment at [facility] orthopedic.  Patient voicing concerns of how she will be able to manage at  home, as she lives by herself.  EDCM instructed patient to voice her concerns at her appointment tomorrow, as they can arrange for home health services or may need short term rehab post surgery.  Patient verbalized understanding.  No further EDCM needs at this time.  03/06/2015 A. Christen Bame RNCM [id number] [facility] sent inbox message to Dr. Doran Durand regarding patient concerns.  03/06/2015 A. Christen Bame [facility] [id number]  Patient confirms she has received wheelchair delivered at home.

## 2015-03-06 NOTE — Progress Notes (Signed)
Alicia Wilkerson  MRN: [id number] DOB: [birth year]  Subjective:  Pt presents to clinic after a fall yesterday with left ankle pain and swelling.  She slipped on the ice and had immediate pain and swelling.  She tried to put weight on the foot but it was to painful.  She has been crawling around her house and elevating her leg and icing it.  She has tried some Aleve for the pain and that has helped a little.  She has no numbness or pain in her toes.  Patient Active Problem List   Diagnosis Date Noted  . Abnormal Pap smear 12/08/2012    Current Outpatient Prescriptions on File Prior to Visit  Medication Sig Dispense Refill  . clonazePAM (KLONOPIN) 0.5 MG tablet Take 0.5 mg by mouth 2 (two) times daily as needed. Panic attacks     No current facility-administered medications on file prior to visit.    Allergies  Allergen Reactions  . Sulfa Antibiotics Hives  . Penicillins Rash    Review of Systems  Musculoskeletal: Positive for gait problem (unable to walk).   Objective:  BP 124/68 mmHg  Pulse 95  Temp(Src) 98.2 F (36.8 C) (Oral)  Resp 18  SpO2 98%  LMP 02/24/2002  Physical Exam  Constitutional: She is oriented to person, place, and time and well-developed, well-nourished, and in no distress.  HENT:  Head: Normocephalic and atraumatic.  Right Ear: Hearing and external ear normal.  Left Ear: Hearing and external ear normal.  Eyes: Conjunctivae are normal.  Neck: Normal range of motion.  Pulmonary/Chest: Effort normal.  Musculoskeletal:       Left ankle: She exhibits decreased range of motion, swelling and ecchymosis. She exhibits normal pulse. Tenderness: all of ankle - worse lateral.  Good DP pulse and capillary refill and patient has good sensation in the foot distal to the fracture  Neurological: She is alert and oriented to person, place, and time. Gait normal.  Skin: Skin is warm and dry.  Psychiatric: Mood, memory, affect and judgment normal.  Vitals  reviewed.  [facility] reading (PRIMARY) by  Dr. Linna Darner.  Bimalleolar fracture that has a uneven joint space in ankle.  Multiple fragments.  Assessment and Plan :  Left ankle pain - Plan: DG Ankle Complete Left, traMADol (ULTRAM) 50 MG tablet - pt was unable to tolerate a camwalker so a custom fiberglass posterior leg splint was applied  Eczema - Plan: halobetasol (ULTRAVATE) 0.05 % ointment, Dermatological Products, Misc. (NEOSALUS) CREA - pt has h/o hand eczema and would like a refill of her medications while she is here  Ankle fracture, left - Plan: DME Wheelchair manual, Ambulatory referral to Orthopedic Surgery, For home use only DME 3 n 1 - this is a complicated unstable fracture and she will f/u with ortho tomorrow for a work-in appointment with Dr Suzan Slick to Amy at [facility] nurse case manager - who was very helpful in getting this patient set up with [facility] for home services of a wheelchair and bedside toilet.  She will go to [facility] ortho in the am for a visit with Dr Doran Durand for evaluation.  She needs to speak with them regarding care at home because she lives at home in a 2 story home.  I spoke with Dr Alvan Dame who stated she will be non-weight bearing for 6-8 weeks - she was placed in a posterior splint because we could not manipulate for foot into a camwalker - her foot is in mild extension and  position of comfort (pt states it feels better once in the splint).  She will elevate and ice and knows not to walk on her foot.  D/w Dr Nyra Jabs PA-C  [facility] 03/06/2015 9:33 PM

## 2015-03-07 ENCOUNTER — Telehealth: Payer: Self-pay | Admitting: General Practice

## 2015-03-14 HISTORY — PX: ANKLE SURGERY: SHX546

## 2015-03-14 HISTORY — PX: ORIF ANKLE FRACTURE: SUR919

## 2015-03-28 ENCOUNTER — Telehealth: Payer: Self-pay | Admitting: Obstetrics & Gynecology

## 2015-03-28 NOTE — Telephone Encounter (Signed)
I called [facility] imaging billing department. Stated that order was processed under M81.0 Age related osteporosis. I advised that this patient does not have a diagnosis of osteporosis but she has a history of osteopenia.  She states that without bone density coded as a screening it will apply to deductible as a medical benefit and not screening. Patient has a HSA [employer] account and final invoice amount [id number].

## 2015-03-28 NOTE — Telephone Encounter (Signed)
Patient is asking to talk a nurse regarding the coding for her recent BMD. Patient said the BMD was coded incorrectly and has never had to pay for her BMD. Patient is asking that the Dx  code could be corrected .

## 2015-03-29 NOTE — Telephone Encounter (Signed)
OK to send in screening code for this but do not remove the osteoporosis code because she does have osteoporosis.  Please let pt know this was coded the exact same way last year so this may be how her insurance processes this.  Thank you.

## 2015-03-29 NOTE — Telephone Encounter (Signed)
Routing to Dr. Sabra Heck to review and advise.  Patient has a history of osteopenia. Can order be changed to screening for osteoporosis?

## 2015-03-30 NOTE — Telephone Encounter (Signed)
Patient called for update.  Advised of message from Dr. Sabra Heck and patient states she is aware that it was coded as the same as previous years, she does have a new [employer].  Call to [place] and added Z13.820 to order for screening for osteoporosis.   Update to patient that claim will be reprocessed with screening added.   Patient agreeable.  Will close encounter.

## 2015-07-03 ENCOUNTER — Telehealth: Payer: Self-pay | Admitting: Obstetrics & Gynecology

## 2015-07-03 NOTE — Telephone Encounter (Signed)
Please call patient about her appointment for 07/06/15. Patient states this is not correct and can only come on a Friday morning with Dr. Sabra Heck.

## 2015-07-04 NOTE — Telephone Encounter (Signed)
Spoke with patient at time of incoming call. Patient states that she was advised her appointment was on 06/29/2015. Came to our office last week and our office was closed due to unexpected maintenance. Patient is scheduled to have her aex with Dr.Miller on 07/06/2015 and reports this date will not work well for her. Would like to reschedule. "I can not wait until her next appointment in October. I need an appointment in June or July." Appointment rescheduled to 08/17/2015 at 8:30 am. Patient is agreeable to date and time.  Routing to provider for final review. Patient agreeable to disposition. Will close encounter.

## 2015-07-06 ENCOUNTER — Ambulatory Visit: Payer: BLUE CROSS/BLUE SHIELD | Admitting: Obstetrics & Gynecology

## 2015-08-17 ENCOUNTER — Encounter: Payer: Self-pay | Admitting: Obstetrics & Gynecology

## 2015-08-17 ENCOUNTER — Ambulatory Visit (INDEPENDENT_AMBULATORY_CARE_PROVIDER_SITE_OTHER): Payer: BLUE CROSS/BLUE SHIELD | Admitting: Obstetrics & Gynecology

## 2015-08-17 VITALS — BP 104/60 | HR 60 | Resp 14 | Ht 63.5 in | Wt 153.8 lb

## 2015-08-17 DIAGNOSIS — R748 Abnormal levels of other serum enzymes: Secondary | ICD-10-CM

## 2015-08-17 DIAGNOSIS — Z124 Encounter for screening for malignant neoplasm of cervix: Secondary | ICD-10-CM | POA: Diagnosis not present

## 2015-08-17 DIAGNOSIS — Z0000 Encounter for general adult medical examination without abnormal findings: Secondary | ICD-10-CM | POA: Diagnosis not present

## 2015-08-17 DIAGNOSIS — Z01419 Encounter for gynecological examination (general) (routine) without abnormal findings: Secondary | ICD-10-CM

## 2015-08-17 LAB — HEPATIC FUNCTION PANEL
ALBUMIN: 4.4 g/dL (ref 3.6–5.1)
ALK PHOS: 98 U/L (ref 33–130)
ALT: 20 U/L (ref 6–29)
AST: 22 U/L (ref 10–35)
BILIRUBIN TOTAL: 1.2 mg/dL (ref 0.2–1.2)
Bilirubin, Direct: 0.2 mg/dL (ref <=0.2)
Indirect Bilirubin: 1 mg/dL (ref 0.2–1.2)
TOTAL PROTEIN: 7.1 g/dL (ref 6.1–8.1)

## 2015-08-17 LAB — CBC
HCT: 37.7 % (ref 35.0–45.0)
Hemoglobin: 12.6 g/dL (ref 11.7–15.5)
MCH: 32.6 pg (ref 27.0–33.0)
MCHC: 33.4 g/dL (ref 32.0–36.0)
MCV: 97.7 fL (ref 80.0–100.0)
MPV: 11.2 fL (ref 7.5–12.5)
Platelets: 380 10*3/uL (ref 140–400)
RBC: 3.86 MIL/uL (ref 3.80–5.10)
RDW: 15.2 % — ABNORMAL HIGH (ref 11.0–15.0)
WBC: 7.1 10*3/uL (ref 3.8–10.8)

## 2015-08-17 MED ORDER — CLONAZEPAM 0.5 MG PO TABS: 0.5000 mg | ORAL_TABLET | Freq: Every day | ORAL | Status: DC

## 2015-08-17 NOTE — Addendum Note (Signed)
Addended by: Megan Salon on: 08/17/2015 09:12 AM   Modules accepted: Orders, SmartSet

## 2015-08-17 NOTE — Progress Notes (Signed)
65 y.o. [id number] WidowedCaucasianF here for annual exam.  Doing well.  Denies vaginal bleeding.  Broke her ankle in January.  Has osteoporosis.  Continues to decline treatment.  PCP:  Carlos Levering.    No LMP recorded.          Sexually active: No.  The current method of family planning is post menopausal status.    Exercising: Yes.    walking, biking, stretching, weights Smoker:  no  Health Maintenance: Pap:  04/28/2014 ASCUS, HR HPV negative History of abnormal Pap:  yes MMG:  01/08/2015 BIRADS 1 negative  Colonoscopy:  05/23/2013 normal, repeat 5 years BMD:   01/05/2015 osteoporosis  TDaP:  2010  Pneumonia vaccine(s):  Not started Zostavax:   Up to date Hep C testing: discuss with provider Screening Labs: discuss with provider, Hb today: discuss with provider, Urine today: discuss with provider, unable to void at this time   reports that she has never smoked. She has never used smokeless tobacco. She reports that she drinks alcohol. She reports that she does not use illicit drugs.  Past Medical History  Diagnosis Date  . Pelvic fracture (HCC)     from fall  . Osteopenia   . Dysplasia of cervix, low grade (CIN 1) 05/08/11  . Hyperparathyroidism, secondary ([place])     due to excess Vit D, normalized off Vit D, never treated  . Anemia   . Anxiety   . Blood transfusion without reported diagnosis   . Osteoporosis     Past Surgical History  Procedure Laterality Date  . Splenectomy, total  1954    due to spherocytosis  . Tubal ligation  1995  . Meniscus repair Right 1991  . Fracture surgery      Current Outpatient Prescriptions  Medication Sig Dispense Refill  . clonazePAM (KLONOPIN) 0.5 MG tablet Take 0.5 mg by mouth 2 (two) times daily as needed. Panic attacks    . Dermatological Products, Misc. (NEOSALUS) CREA Apply 1 application topically daily as needed. Reported on 03/06/2015 100 g 1  . halobetasol (ULTRAVATE) 0.05 % ointment Apply 1 application topically 2 (two) times  daily. Reported on 03/06/2015 15 g 0  . traMADol (ULTRAM) 50 MG tablet Take 1 tablet (50 mg total) by mouth every 8 (eight) hours as needed. 30 tablet 0   No current facility-administered medications for this visit.    Family History  Problem Relation Age of Onset  . Heart failure Father   . Skin cancer Father   . Hypertension Father   . Osteoporosis Mother   . Colon cancer Mother     deceased  . Heart disease Sister     ROS:  Pertinent items are noted in HPI.  Otherwise, a comprehensive ROS was negative.  Exam:   Filed Vitals:   08/17/15 0833  BP: 104/60  Pulse: 60  Resp: 14  Height: 5' 3.5" (1.613 m)  Weight: 153 lb 12.8 oz (69.763 kg)    General appearance: alert, cooperative and appears stated age Head: Normocephalic, without obvious abnormality, atraumatic Neck: no adenopathy, supple, symmetrical, trachea midline and thyroid normal to inspection and palpation Lungs: clear to auscultation bilaterally Breasts: normal appearance, no masses or tenderness Heart: regular rate and rhythm Abdomen: soft, non-tender; bowel sounds normal; no masses,  no organomegaly Extremities: extremities normal, atraumatic, no cyanosis or edema Skin: Skin color, texture, turgor normal. No rashes or lesions Lymph nodes: Cervical, supraclavicular, and axillary nodes normal. No abnormal inguinal nodes palpated Neurologic: Grossly normal  Pelvic: External genitalia:  no lesions              Urethra:  normal appearing urethra with no masses, tenderness or lesions              Bartholins and Skenes: normal                 Vagina: normal appearing vagina with normal color and discharge, no lesions              Cervix: no lesions              Pap taken: Yes.   Bimanual Exam:  Uterus:  normal size, contour, position, consistency, mobility, non-tender              Adnexa: normal adnexa and no mass, fullness, tenderness               Rectovaginal: Confirms               Anus:  normal sphincter  tone, no lesions  Chaperone was present for exam.  A:  Well Woman with normal exam PMP, no HRT H/O LGSIL with +HR HPV H/O family hx of colon cancer, mother Osteopororsis. Ankle fracture in January.    P: Mammogram yearly pap smear with HR HPV today Klonopin 0.5mg  po qd.  #30/1RF.  Pt will have new PCP take care of this. Liver enzymes and Hep C antibody today Declines treatment for osteoporosis.  Currently declines follow-up BMD at this time return annually or prn

## 2015-08-18 LAB — HEPATITIS C ANTIBODY: HCV AB: NEGATIVE

## 2015-08-23 LAB — IPS PAP TEST WITH HPV

## 2015-08-30 ENCOUNTER — Telehealth: Payer: Self-pay | Admitting: Obstetrics & Gynecology

## 2015-08-30 DIAGNOSIS — R87611 Atypical squamous cells cannot exclude high grade squamous intraepithelial lesion on cytologic smear of cervix (ASC-H): Secondary | ICD-10-CM

## 2015-08-30 NOTE — Telephone Encounter (Signed)
Return call to patient. States has decided she would prefer afternoon appointment so she is able to go home and rest and not need full day off work. Also requests a Friday appointment.  Appointment rescheduled to 09-14-15 at 330 pm per patient request.  When reviewing instructions for Motrin, patient states she did not have this with previous colpo but was given something for her nerves. She did not have any problems with procedure. She did not request medication but routing to provider for review.

## 2015-08-30 NOTE — Telephone Encounter (Signed)
Patient returning call.

## 2015-08-30 NOTE — Telephone Encounter (Signed)
-----   Message from Megan Salon, MD sent at 08/27/2015 11:29 AM EDT ----- Called pt.  Per DPR, left detailed message on voicemail.  Pt also requested I call personally.  Pt notified pap is ASCUS H with +HR HPV.  Has hx of +HR HPV.  Needs colposcopy again.  Informed pt, she would be called to schedule the colposcopy and that she and I would review all results in person before the colposcopy.  Thanks.

## 2015-08-30 NOTE — Telephone Encounter (Signed)
Return call to patient. Per instruction. Left message with colpo appointment scheduled for 09-03-15 at 1000 with Dr Sabra Heck.  Left message with instructions to take Motrin 800 mg one hour prior with food and to call back to let us know  She received message and confirm if this appointment will work.

## 2015-08-30 NOTE — Telephone Encounter (Signed)
Patient calling to schedule an appointment for a colposcopy. Ok to leave a detailed message with dates and times. Prefer in the mornings.

## 2015-09-03 ENCOUNTER — Ambulatory Visit: Payer: BLUE CROSS/BLUE SHIELD | Admitting: Obstetrics & Gynecology

## 2015-09-05 NOTE — Telephone Encounter (Signed)
Anything needed for this colpo or may I close encounter?

## 2015-09-07 NOTE — Telephone Encounter (Signed)
Ok to close encounter.  Pt is very good about asking for anxiolytic when needed.  I don't think you need to ask her about this.  Thanks for the message.

## 2015-09-14 ENCOUNTER — Encounter: Payer: Self-pay | Admitting: Obstetrics & Gynecology

## 2015-09-14 ENCOUNTER — Ambulatory Visit (INDEPENDENT_AMBULATORY_CARE_PROVIDER_SITE_OTHER): Payer: BLUE CROSS/BLUE SHIELD | Admitting: Obstetrics & Gynecology

## 2015-09-14 VITALS — BP 110/56 | HR 74 | Resp 16 | Ht 64.0 in | Wt 153.8 lb

## 2015-09-14 DIAGNOSIS — Z205 Contact with and (suspected) exposure to viral hepatitis: Secondary | ICD-10-CM

## 2015-09-14 DIAGNOSIS — R87611 Atypical squamous cells cannot exclude high grade squamous intraepithelial lesion on cytologic smear of cervix (ASC-H): Secondary | ICD-10-CM

## 2015-09-14 DIAGNOSIS — IMO0002 Reserved for concepts with insufficient information to code with codable children: Secondary | ICD-10-CM

## 2015-09-14 DIAGNOSIS — B977 Papillomavirus as the cause of diseases classified elsewhere: Secondary | ICD-10-CM

## 2015-09-14 DIAGNOSIS — R888 Abnormal findings in other body fluids and substances: Secondary | ICD-10-CM | POA: Diagnosis not present

## 2015-09-14 NOTE — Progress Notes (Signed)
65 y.o. Widowed  female here for colposcopy with possible biopsies and/or ECC due to ASCUS-H  Pap with +HR HPV obtained 08/17/15.    Prior evaluation/treatment:   04/28/14 ASCUS pap with neg HR HPV 06/23/13 colposcopy due to LGSIL pap with +HR HVP  06/09/13.  Neg ECC obtained. 06/17/12 colposcopy due to LGSIL pap smearobtained 06/07/12.  Negative ECC. 11/05/11 LGSIL pap and +HRHPV  Pt with may questions, as is typical for pt.  Although she's undergone several colposcopies, she wants to review history (which was done) and her current results.  D/W pt there is now a high grade finding on Pap smear so treatment is going to be recommended.  LEEP discussed.  Also, d/w pt proceeding directly to LEEP today after looking with colposcope if she desires.  Pros/cons of this option reviewed.  She does not think that is what she wants to do today.  Pt has questions about partner.  He has an abdominal aortic aneurysm and is getting ready for this to be repaired.  In his evaluation, he was diagnosed with Chronic Hep c and Hep B.  Pt wants to review what hepatitis was tested.  She's has negative Hep c testing.  Advised I would recommend Hep B testing as well.  Pt states she is not sure this is "necessary".  Highly advised testing espeically considering she had abnormal liver enzymes a year ago.  After much discussion, pt states she will but does not want to do it today.  At this point, could proceed with procedure.  At this point, >30 minutes was spent in face to face discussion due to above concerns which is much more than is typically within the scope of normal counseling for a colposcopy.  No LMP recorded (within years). Patient is postmenopausal.          Sexually active: Yes.    The current method of family planning is post menopausal status.     Patient has been counseled about results and procedure.  Risks and benefits have bene reviewed including immediate and/or delayed bleeding, infection, cervical scaring from  procedure, possibility of needing additional follow up as well as treatment.  rare risks of missing a lesion discussed as well.  All questions answered.  Pt ready to proceed.  BP 110/56 mmHg  Pulse 74  Resp 16  Ht 5\' 4"  (1.626 m)  Wt 153 lb 12.8 oz (69.763 kg)  BMI 26.39 kg/m2  LMP  (Within Years)  Physical Exam  Speculum placed.  3% acetic acid applied to cervix for >45 seconds.  Cervix visualized with both 7.5X and 15X magnification.  Green filter also used.  Lugols solution was used.  Findings:  Small area of decreased staining at 12 o'clock with Lugol's solution.  Biopsy:  12 o'clock.  ECC:  was performed.  Monsel's was needed.  Excellent hemostasis was present.  Pt tolerated procedure well and all instruments were removed.  Findings noted above on picture of cervix.  Assessment:  ASCUS H Pap with +HR HPV in pt with positive HPV for >4 years Possible Hep B exposure  Plan:  Pathology results will be called to patient and follow-up planned pending results.  Likely this week be a recommendation to proceed with LEEP. Pt HIGHLY encouraged to return for Hep B testing.  She does voice understanding.

## 2015-09-19 LAB — IPS OTHER TISSUE BIOPSY

## 2015-09-27 ENCOUNTER — Other Ambulatory Visit: Payer: Self-pay | Admitting: *Deleted

## 2015-09-27 DIAGNOSIS — R87611 Atypical squamous cells cannot exclude high grade squamous intraepithelial lesion on cytologic smear of cervix (ASC-H): Secondary | ICD-10-CM

## 2015-10-02 ENCOUNTER — Telehealth: Payer: Self-pay | Admitting: *Deleted

## 2015-10-02 NOTE — Telephone Encounter (Addendum)
Follow-up call to patient regarding recommended LEEP procedure.  Patient still has multiple questions regarding procedure. Advised recommend office visit with Dr Sabra Heck to discuss all her concerns.  Patient declines appointment due to distance from office and prefers to e-mail her questions. Advised this is not a secure method to communicate and her questions are more detailed than written communication. Again encouraged appointment.  Patient prefers to ask Dr Sabra Heck a couple of questions and consider the answers and then discuss further at appointment prior to proceeding with the procedure (but at the same appointment) -why does she ned to proceed so soon? -what is the potential that we may miss something on LEEP like we might be missing on colpo? Will insurance cover this since it is not outside usual protocol?  -chance of need for repeat procedure?   Advised patient cannot guarantee benefits but procedure has been precerted and clinical documentation supports procedure. Also reviewed the 10% risk of recurrence that Dr Sabra Heck often discusses with patients (would have to confirm this.)  Advised remaining questions will be reviewed with MD. Patient requests I leave detailed message on voice mail. She will consider info and call back in a week or so to schedule.  Routing to Dr Sabra Heck.

## 2015-10-02 NOTE — Telephone Encounter (Signed)
-----   Message from Megan Salon, MD sent at 09/25/2015  6:32 PM EDT ----- Please call pt and schedule LEEP.  I have spoken with her personally and she is aware of my recommendation of proceed with LEEP.

## 2015-10-02 NOTE — Telephone Encounter (Signed)
Encounter previously closed prior to sending for MD review.

## 2015-10-12 ENCOUNTER — Telehealth: Payer: Self-pay | Admitting: Obstetrics & Gynecology

## 2015-10-12 NOTE — Telephone Encounter (Signed)
Patient calling to give possible dates that are good for a procedure. Said she spoke to [place] about this.

## 2015-10-12 NOTE — Telephone Encounter (Signed)
Return call to patient, left message to call back. 

## 2015-10-12 NOTE — Telephone Encounter (Signed)
Patient returned call approximately 1010 am. Patient states she is ready to schedule LEEP and will discuss further with Dr Sabra Heck at time of procedure. Scheduled for 11-02-15 at 4pm per patient request for Friday afternoon.   Patient reports she continues to have light bleeding daily since colposcopy.States bleeding is just enough to require a pad every day. Offered office visit today for evaluation. Patient declined due to work. Needs a first am appointment. First available morning appointment is Tuesday 10-16-15 at [place] . Encouraged to call back if bleeding increases or decides on earlier appointment.   Routing to provider for final review. Patient agreeable to disposition. Will close encounter.

## 2015-10-15 ENCOUNTER — Telehealth: Payer: Self-pay | Admitting: Obstetrics & Gynecology

## 2015-10-15 NOTE — Telephone Encounter (Signed)
Patient called and cancelled her appointment for "vaginal discharge" tomorrow due to "feeling better."  FYI only.

## 2015-10-16 ENCOUNTER — Ambulatory Visit: Payer: BLUE CROSS/BLUE SHIELD | Admitting: Obstetrics & Gynecology

## 2015-10-23 NOTE — Telephone Encounter (Signed)
Telephone note not routed in error. Routing to Dr. Sabra Heck for review.

## 2015-11-02 ENCOUNTER — Ambulatory Visit (INDEPENDENT_AMBULATORY_CARE_PROVIDER_SITE_OTHER): Payer: BLUE CROSS/BLUE SHIELD | Admitting: Obstetrics & Gynecology

## 2015-11-02 VITALS — BP 122/60 | HR 76 | Temp 98.4°F | Resp 14 | Ht 64.0 in | Wt 155.0 lb

## 2015-11-02 DIAGNOSIS — B977 Papillomavirus as the cause of diseases classified elsewhere: Secondary | ICD-10-CM

## 2015-11-02 DIAGNOSIS — R87611 Atypical squamous cells cannot exclude high grade squamous intraepithelial lesion on cytologic smear of cervix (ASC-H): Secondary | ICD-10-CM

## 2015-11-02 NOTE — Patient Instructions (Addendum)
[phone] (office number)  LEEP Post Procedure Instructions  1. You may take Ibuprofen, Aleve or Tylenol for pain if needed, cramping is normal   2. You will have black and/or bloody discharge at first. This will lighten and turn clear before completely resolving.. This will take 2 to 3 weeks.  3. Put nothing in your vagina until the bleeding or discharge stops (usually 2 or 3 days).  4. You need to call, if there is any unusual draining, if the bleeding is heavy, or if you are concerned.  5. Shower and bathe as normal.  6. We will call you within a week with results or we will discuss the results at your follow-up appointment if needed.  7. You will need to return for a follow-up Pap smear as directed by your physician.

## 2015-11-02 NOTE — Progress Notes (Signed)
65 y.o. G63P3 Widowed Caucasian female here for LEEP due to ASCUS H pap but colposcopy showing no abnormality and ECC without abnormal findings.    Abnormal pap and evaluation before current pap smear: 04/28/14 ASCUS pap with neg HR HPV 06/23/13 colposcopy due to LGSIL pap with +HR HVP  06/09/13.  Neg ECC obtained. 06/17/12 colposcopy due to LGSIL pap smearobtained 06/07/12.  Negative ECC. 11/05/11 LGSIL pap and +HRHPV  Pt has many questions today.  We have reviewed this in detail since the [facility] results were finalized but she still wants to discuss this.  She wants to know specifically why she should proceed with LEEP given negative ECC.  Again, we have discussed this in detail already but reviewed with pt guidelines for treatment given persistent abnormal pap smear and given high grade finding on pap with no pathology findings on colposcopy.  Likely, there is dysplasia within the cervical canal that has just not been biopsied.  I highly recommend she proceed with treatment.    Pt continues to have questions regarding HPV.  This was reviewed with pt as well.  Pt reports [facility] and [facility] as well.  Advised pt I was glad to let her see gyn/onc for consultation if she desired and this would likely be a better second opinion.  She states that is not necessary.  Pt got records for the second opinion and reports she'd like me to remove from prior notes the statement about recommending hep B testing.  Significant other has been diagnosed with auto-immune liver disease and his hepatitis testing is negative.  D/w pt I cannot remove anything from prior not but will add this discuss to current note so it is clear this is no longer needed.    Pt has additional information regarding partner who has been diagnosed with auto-immune hepatitis.  We discussed Hep B testing at last visit but that is unnecessary due to his diagnosis.    Pre-procedure vitals:  Blood pressure 122/60, pulse 76, temperature 98.4 F (36.9 C), temperature source Oral, resp. rate 14, height 5\' 4"  (1.626 m), weight 155 lb (70.3 kg).   Procedure explained and patient's questions were invited and answered.   Consent form signed.  Procedure Set-up: Grounding pad located left thigh.  Cautery settings: 55 cut/55 coagulation.  Suction applied to coated speculum.  Procedure:  Speculum placed with good visualization of the cervix.  Colposcopy performed showing:  no visible lesions.  Cervix anesthetized using 2% Xylocaine with 1:100,000units Epinephrine.  10 cc's used.  Lot #: [id number].  Exp: 3/18.  Entire transition zone excised with 12 x 83mm loop in 3 passes.  Specimen(s) placed on cork and labeled for pathology.  Hemostasis obtained with ball cautery and Monsel's solution.  EBL:  Minimal  Complications:  none  Patient tolerated procedure well and left the office in satisfactory condition.  Plan:  After visit summary given.  Repeat pap planned in 6-12 months, pending pathology report.  Over 60 minutes spent with pt, >50 % of time in face to face discussion prior to procedure.  This is in excess of typical time required for counseling with average pt having LEEP procedure.

## 2015-11-07 LAB — IPS OTHER TISSUE BIOPSY

## 2015-11-09 ENCOUNTER — Telehealth: Payer: Self-pay | Admitting: Obstetrics & Gynecology

## 2015-11-09 NOTE — Telephone Encounter (Signed)
Had LEEP Friday with Dr Sabra Heck and experienced some bleeding/dark blood. Began experienceing appearance of  "black soot" coming out approx 3 days post procedure. Going through 1 pad daily. No pain. Explained I would attempt to find triage nurse to speak with patient as she is very anxious. If unavailable will forward message. Patient put on hold agreeable to plan.

## 2015-11-09 NOTE — Telephone Encounter (Signed)
Spoke with patient in regards to concerns over LEEP procedure from 11/02/15. Patient states that she had some "light bleeding" last weekend from the procedure, but starting on Monday/Tuesday she started having black discharge, almost "soot-like." Denies bright red bleeding. Patient states she is having to change her pad 3-4 times a day. RN advised patient this was normal due to the cautery Dr. Sabra Heck had to use during the procedure. Patient states that is what she thought, but wanted to be sure. RN offered office visit to patient to see Dr. Sabra Heck today (11/09/15), but patient declined stating she was in [facility] and it would be impossible for her to get here. Patient states she will call back next week to schedule an office visit if the black discharge continues through the weekend. RN advised patient to return call to office or seek emergency care if the discharge suddenly became heavy or bright red. Patient verbalized understanding.    Routing to provider for final review. Patient agreeable to disposition. Will close encounter.

## 2015-12-03 ENCOUNTER — Other Ambulatory Visit: Payer: Self-pay | Admitting: Obstetrics & Gynecology

## 2015-12-03 DIAGNOSIS — Z1231 Encounter for screening mammogram for malignant neoplasm of breast: Secondary | ICD-10-CM

## 2015-12-26 DIAGNOSIS — C44619 Basal cell carcinoma of skin of left upper limb, including shoulder: Secondary | ICD-10-CM

## 2015-12-26 HISTORY — DX: Basal cell carcinoma of skin of left upper limb, including shoulder: C44.619

## 2016-01-09 ENCOUNTER — Ambulatory Visit
Admission: RE | Admit: 2016-01-09 | Discharge: 2016-01-09 | Disposition: A | Discharge status: Home / Self Care | Payer: BLUE CROSS/BLUE SHIELD | Source: Ambulatory Visit | Attending: Obstetrics & Gynecology | Admitting: Obstetrics & Gynecology

## 2016-01-09 ENCOUNTER — Ambulatory Visit: Payer: BLUE CROSS/BLUE SHIELD

## 2016-01-09 DIAGNOSIS — Z1231 Encounter for screening mammogram for malignant neoplasm of breast: Secondary | ICD-10-CM

## 2016-02-05 ENCOUNTER — Other Ambulatory Visit: Payer: Self-pay | Admitting: Physician Assistant

## 2016-02-05 DIAGNOSIS — L309 Dermatitis, unspecified: Secondary | ICD-10-CM

## 2016-02-15 ENCOUNTER — Ambulatory Visit (INDEPENDENT_AMBULATORY_CARE_PROVIDER_SITE_OTHER): Payer: BLUE CROSS/BLUE SHIELD | Admitting: Obstetrics & Gynecology

## 2016-02-15 ENCOUNTER — Encounter: Payer: Self-pay | Admitting: Obstetrics & Gynecology

## 2016-02-15 VITALS — BP 124/70 | HR 64 | Resp 14 | Ht 64.0 in | Wt 157.0 lb

## 2016-02-15 DIAGNOSIS — N871 Moderate cervical dysplasia: Secondary | ICD-10-CM | POA: Diagnosis not present

## 2016-02-15 NOTE — Patient Instructions (Signed)
[place]  [address]

## 2016-02-15 NOTE — Progress Notes (Signed)
GYNECOLOGY  VISIT   HPI: 65 y.o. G60P2003 Widowed Caucasian female with history of LEEP and CIN 2 in September after being followed for several years with low grade abnormal pap smears.  Margins for the CIN 2 were negative.  Pt was anxious about waiting six months so is here for pap.  D/W pt +/- of testing for HPV today.  She would like to proceed.  No vaginal bleeing.  GYNECOLOGIC HISTORY: No LMP recorded (within years). Patient is postmenopausal. Contraception: menopausal Menopausal hormone therapy: none  Patient Active Problem List   Diagnosis Date Noted  . Abnormal Pap smear 12/08/2012    Past Medical History:  Diagnosis Date  . Anemia   . Anxiety   . Blood transfusion without reported diagnosis   . Dysplasia of cervix, low grade (CIN 1) 05/08/11  . Hyperparathyroidism, secondary ([place])    due to excess Vit D, normalized off Vit D, never treated  . Osteopenia   . Osteoporosis   . Pelvic fracture ([place])    from fall    Past Surgical History:  Procedure Laterality Date  . ANKLE SURGERY  03/14/2015   . FRACTURE SURGERY    . MENISCUS REPAIR Right 1991  . SPLENECTOMY, TOTAL  1954   due to spherocytosis  . TUBAL LIGATION  1995    MEDS:  Reviewed in EPIC and UTD  ALLERGIES: Sulfa antibiotics and Penicillins  Family History  Problem Relation Age of Onset  . Heart failure Father   . Skin cancer Father   . Hypertension Father   . Osteoporosis Mother   . Colon cancer Mother     deceased  . Heart disease Sister     SH:  Widowed, non smoker  Review of Systems  All other systems reviewed and are negative.   PHYSICAL EXAMINATION:    BP 124/70 (BP Location: Right Arm, Patient Position: Sitting, Cuff Size: Normal)   Pulse 64   Resp 14   Ht 5\' 4"  (1.626 m)   Wt 157 lb (71.2 kg)   LMP  (Within Years)   BMI 26.95 kg/m     General appearance: alert, cooperative and appears stated age  Pelvic: External genitalia:  no lesions              Urethra:  normal appearing  urethra with no masses, tenderness or lesions              Bartholins and Skenes: normal                 Vagina: normal appearing vagina with normal color and discharge, no lesions              Cervix: no lesions and but cervix is small from prior LEEP, well healed    Pap obtained.  Chaperone was present for exam.  Assessment: H/O CIN 2 H/O HR HPV  Plan: Pap and HR HPV pending.  Results and recommendations will be called to pt.

## 2016-02-20 LAB — PAP IG AND HPV HIGH-RISK: HPV DNA High Risk: NOT DETECTED

## 2016-04-24 DIAGNOSIS — C44712 Basal cell carcinoma of skin of right lower limb, including hip: Secondary | ICD-10-CM

## 2016-04-24 HISTORY — DX: Basal cell carcinoma of skin of right lower limb, including hip: C44.712

## 2016-08-21 ENCOUNTER — Encounter: Payer: Self-pay | Admitting: Obstetrics & Gynecology

## 2016-08-21 ENCOUNTER — Ambulatory Visit (INDEPENDENT_AMBULATORY_CARE_PROVIDER_SITE_OTHER): Payer: BLUE CROSS/BLUE SHIELD | Admitting: Obstetrics & Gynecology

## 2016-08-21 ENCOUNTER — Other Ambulatory Visit (HOSPITAL_COMMUNITY)
Admission: RE | Admit: 2016-08-21 | Discharge: 2016-08-21 | Disposition: A | Discharge status: Home / Self Care | Payer: BLUE CROSS/BLUE SHIELD | Source: Ambulatory Visit | Attending: Obstetrics & Gynecology | Admitting: Obstetrics & Gynecology

## 2016-08-21 VITALS — BP 114/70 | HR 70 | Resp 16 | Ht 63.5 in | Wt 161.0 lb

## 2016-08-21 DIAGNOSIS — Z87410 Personal history of cervical dysplasia: Secondary | ICD-10-CM | POA: Insufficient documentation

## 2016-08-21 DIAGNOSIS — Z01419 Encounter for gynecological examination (general) (routine) without abnormal findings: Secondary | ICD-10-CM | POA: Diagnosis not present

## 2016-08-21 DIAGNOSIS — N871 Moderate cervical dysplasia: Secondary | ICD-10-CM | POA: Insufficient documentation

## 2016-08-21 DIAGNOSIS — Z0000 Encounter for general adult medical examination without abnormal findings: Secondary | ICD-10-CM | POA: Insufficient documentation

## 2016-08-21 DIAGNOSIS — Z23 Encounter for immunization: Secondary | ICD-10-CM | POA: Diagnosis not present

## 2016-08-21 DIAGNOSIS — M8000XD Age-related osteoporosis with current pathological fracture, unspecified site, subsequent encounter for fracture with routine healing: Secondary | ICD-10-CM

## 2016-08-21 NOTE — Progress Notes (Signed)
66 y.o. [id number] WidowedCaucasianF here for annual exam.  Still doesn't have PCP.  Needs blood work today.     Denies vaginal bleeding.   Would like   Patient's last menstrual period was 02/24/2002.          Sexually active: No.  The current method of family planning is post menopausal status.    Exercising: Yes.    bike, walking Smoker:  no  Health Maintenance: Pap:  02/15/16 Neg. HR HPV:neg  08/17/15 ASC-H. HR HPV:+Detected. 11/05/15 LEEP CIN2   History of abnormal Pap:  yes MMG:  01/09/16 BIRADS1:neg Colonoscopy:  05/23/13 Normal. F/u 5 years BMD:   01/05/15 Osteoporosis  TDaP: will update today Pneumonia vaccine(s):  Done  Zostavax:   Done  Hep C testing: 08/17/15 Neg  Screening Labs: Here    reports that she has never smoked. She has never used smokeless tobacco. She reports that she drinks alcohol. She reports that she does not use drugs.  Past Medical History:  Diagnosis Date  . Anemia   . Anxiety   . Basal cell carcinoma (BCC) of left upper arm 12/2015  . Basal cell carcinoma (BCC) of right lower leg 04/2016  . Blood transfusion without reported diagnosis   . Dysplasia of cervix, low grade (CIN 1) 05/08/11  . Hyperparathyroidism, secondary ([place])    due to excess Vit D, normalized off Vit D, never treated  . Osteopenia   . Osteoporosis   . Pelvic fracture ([place])    from fall    Past Surgical History:  Procedure Laterality Date  . ANKLE SURGERY  03/14/2015   . BASAL CELL CARCINOMA EXCISION     Right arm 2017, and left leg 2018  . FRACTURE SURGERY    . MENISCUS REPAIR Right 1991  . SPLENECTOMY, TOTAL  1954   due to spherocytosis  . TUBAL LIGATION  1995    Current Outpatient Prescriptions  Medication Sig Dispense Refill  . clonazePAM (KLONOPIN) 0.5 MG tablet Take 1 tablet (0.5 mg total) by mouth daily. Panic attacks (Patient taking differently: Take 0.5 mg by mouth daily. Panic attacks - as needed) 30 tablet 1  . Dermatological Products, Misc. (NEOSALUS) CREA  Apply 1 application topically daily as needed. Reported on 03/06/2015 100 g 1  . halobetasol (ULTRAVATE) 0.05 % ointment APPLY TO AFFECTED AREA TWICE DAILY 50 g 0   No current facility-administered medications for this visit.     Family History  Problem Relation Age of Onset  . Heart failure Father   . Skin cancer Father   . Hypertension Father   . Osteoporosis Mother   . Colon cancer Mother        deceased  . Heart disease Sister     ROS:  Pertinent items are noted in HPI.  Otherwise, a comprehensive ROS was negative.  Exam:   BP 114/70 (BP Location: Right Arm, Patient Position: Sitting, Cuff Size: Large)   Pulse 70   Resp 16   Ht 5' 3.5" (1.613 m)   Wt 161 lb (73 kg)   LMP 02/24/2002   BMI 28.07 kg/m   Weight change: +8#  Height: 5' 3.5" (161.3 cm)  Ht Readings from Last 3 Encounters:  08/21/16 5' 3.5" (1.613 m)  02/15/16 5\' 4"  (1.626 m)  11/02/15 5\' 4"  (1.626 m)    General appearance: alert, cooperative and appears stated age Head: Normocephalic, without obvious abnormality, atraumatic Neck: no adenopathy, supple, symmetrical, trachea midline and thyroid normal to inspection and palpation Lungs:  clear to auscultation bilaterally Breasts: normal appearance, no masses or tenderness Heart: regular rate and rhythm Abdomen: soft, non-tender; bowel sounds normal; no masses,  no organomegaly Extremities: extremities normal, atraumatic, no cyanosis or edema Skin: Skin color, texture, turgor normal. No rashes or lesions Lymph nodes: Cervical, supraclavicular, and axillary nodes normal. No abnormal inguinal nodes palpated Neurologic: Grossly normal   Pelvic: External genitalia:  no lesions              Urethra:  normal appearing urethra with no masses, tenderness or lesions              Bartholins and Skenes: normal                 Vagina: normal appearing vagina with normal color and discharge, no lesions              Cervix: no lesions              Pap taken: Yes.    Bimanual Exam:  Uterus:  normal size, contour, position, consistency, mobility, non-tender              Adnexa: normal adnexa and no mass, fullness, tenderness               Rectovaginal: Confirms               Anus:  normal sphincter tone, no lesions  Chaperone was present for exam.  A:  Well Woman with normal exam PMP, no HRT H/o CIN 2 with +HR HPV, s/p LEEP 9/11 Family hx of colon cancer with her mother Osteoporosis with hx of ankle fracture  P:   Mammogram guidelines reviewed pap smear with HR HPV obtained today CBC, CMP, lipids, TSH, Vit D Dexa scan ordered today Tdap updated today Pt will check about whether the shingles vaccination is scheduled return annually or prn

## 2016-08-21 NOTE — Patient Instructions (Addendum)
Dermatologist:  [facility], Marcine Matar. [facility].  Christina [address] Dr. Chester Hill of [place] with insurance about shingrix vaccination.

## 2016-08-22 LAB — COMPREHENSIVE METABOLIC PANEL
ALK PHOS: 95 IU/L (ref 39–117)
ALT: 31 IU/L (ref 0–32)
AST: 29 IU/L (ref 0–40)
Albumin/Globulin Ratio: 1.8 (ref 1.2–2.2)
Albumin: 4.6 g/dL (ref 3.6–4.8)
BUN/Creatinine Ratio: 21 (ref 12–28)
BUN: 17 mg/dL (ref 8–27)
Bilirubin Total: 1 mg/dL (ref 0.0–1.2)
CALCIUM: 9.7 mg/dL (ref 8.7–10.3)
CO2: 23 mmol/L (ref 20–29)
CREATININE: 0.82 mg/dL (ref 0.57–1.00)
Chloride: 99 mmol/L (ref 96–106)
GFR calc Af Amer: 87 mL/min/{1.73_m2} (ref >59)
GFR, EST NON AFRICAN AMERICAN: 76 mL/min/{1.73_m2} (ref >59)
GLUCOSE: 92 mg/dL (ref 65–99)
Globulin, Total: 2.6 g/dL (ref 1.5–4.5)
Potassium: 4.9 mmol/L (ref 3.5–5.2)
Sodium: 139 mmol/L (ref 134–144)
Total Protein: 7.2 g/dL (ref 6.0–8.5)

## 2016-08-22 LAB — LIPID PANEL
CHOL/HDL RATIO: 2.4 ratio (ref 0.0–4.4)
CHOLESTEROL TOTAL: 204 mg/dL — AB (ref 100–199)
HDL: 84 mg/dL (ref >39)
LDL CALC: 110 mg/dL — AB (ref 0–99)
TRIGLYCERIDES: 48 mg/dL (ref 0–149)
VLDL Cholesterol Cal: 10 mg/dL (ref 5–40)

## 2016-08-22 LAB — CBC
HEMATOCRIT: 39.5 % (ref 34.0–46.6)
Hemoglobin: 13.5 g/dL (ref 11.1–15.9)
MCH: 33.1 pg — ABNORMAL HIGH (ref 26.6–33.0)
MCHC: 34.2 g/dL (ref 31.5–35.7)
MCV: 97 fL (ref 79–97)
Platelets: 360 10*3/uL (ref 150–379)
RBC: 4.08 x10E6/uL (ref 3.77–5.28)
RDW: 15.4 % (ref 12.3–15.4)
WBC: 6.4 10*3/uL (ref 3.4–10.8)

## 2016-08-22 LAB — VITAMIN D 25 HYDROXY (VIT D DEFICIENCY, FRACTURES): Vit D, 25-Hydroxy: 27.4 ng/mL — ABNORMAL LOW (ref 30.0–100.0)

## 2016-08-22 LAB — TSH: TSH: 1.25 u[IU]/mL (ref 0.450–4.500)

## 2016-08-23 LAB — CYTOLOGY - PAP
Diagnosis: NEGATIVE
HPV (WINDOPATH): NOT DETECTED

## 2016-08-26 ENCOUNTER — Telehealth: Payer: Self-pay | Admitting: *Deleted

## 2016-08-26 NOTE — Telephone Encounter (Signed)
Called patient. Left voicemail to call back to schedule AEX.  Please schedule patient for AEX close to 08/21/17 per SM

## 2016-08-28 NOTE — Telephone Encounter (Signed)
AEX scheduled 08/31/17 @8 :30am  Patient requesting copy of AVS and Lab report.  Both mailed to patient today

## 2016-09-01 ENCOUNTER — Ambulatory Visit (INDEPENDENT_AMBULATORY_CARE_PROVIDER_SITE_OTHER): Payer: BLUE CROSS/BLUE SHIELD | Admitting: Physician Assistant

## 2016-09-01 ENCOUNTER — Telehealth: Payer: Self-pay

## 2016-09-01 ENCOUNTER — Encounter: Payer: Self-pay | Admitting: Physician Assistant

## 2016-09-01 VITALS — BP 133/78 | HR 70 | Temp 97.3°F | Resp 18 | Ht 63.5 in | Wt 163.2 lb

## 2016-09-01 DIAGNOSIS — F411 Generalized anxiety disorder: Secondary | ICD-10-CM | POA: Diagnosis not present

## 2016-09-01 DIAGNOSIS — M62830 Muscle spasm of back: Secondary | ICD-10-CM | POA: Diagnosis not present

## 2016-09-01 DIAGNOSIS — M546 Pain in thoracic spine: Secondary | ICD-10-CM | POA: Diagnosis not present

## 2016-09-01 MED ORDER — MELOXICAM 15 MG PO TABS: 15.0000 mg | ORAL_TABLET | Freq: Every day | ORAL | 1 refills | Status: DC

## 2016-09-01 MED ORDER — CLONAZEPAM 0.5 MG PO TABS: 0.5000 mg | ORAL_TABLET | Freq: Every day | ORAL | 1 refills | Status: DC

## 2016-09-01 MED ORDER — CYCLOBENZAPRINE HCL 10 MG PO TABS
10.0000 mg | ORAL_TABLET | Freq: Three times a day (TID) | ORAL | 0 refills | Status: DC | PRN
Start: 2016-09-01 — End: 2016-10-10

## 2016-09-01 NOTE — Patient Instructions (Addendum)
Continue applying heat and/or ice to the affected area.  Flexeril is a muscle relaxer. Please take this as directed.  Meloxicam is an antiinflammatory. Take this once a day.   Thank you for coming in today. I hope you feel we met your needs.  Feel free to call [facility] if you have any questions or further requests.  Please consider signing up for MyChart if you do not already have it, as this is a great way to communicate with me.  Best,  Whitney McVey, PA-C   Low Back Strain Rehab Ask your health care provider which exercises are safe for you. Do exercises exactly as told by your health care provider and adjust them as directed. It is normal to feel mild stretching, pulling, tightness, or discomfort as you do these exercises, but you should stop right away if you feel sudden pain or your pain gets worse. Do not begin these exercises until told by your health care provider. Stretching and range of motion exercises These exercises warm up your muscles and joints and improve the movement and flexibility of your back. These exercises also help to relieve pain, numbness, and tingling. Exercise A: Single knee to chest  1. Lie on your back on a firm surface with both legs straight. 2. Bend one of your knees. Use your hands to move your knee up toward your chest until you feel a gentle stretch in your lower back and buttock. ? Hold your leg in this position by holding onto the front of your knee. ? Keep your other leg as straight as possible. 3. Hold for __________ seconds. 4. Slowly return to the starting position. 5. Repeat with your other leg. Repeat __________ times. Complete this exercise __________ times a day. Exercise B: Prone extension on elbows  1. Lie on your abdomen on a firm surface. 2. Prop yourself up on your elbows. 3. Use your arms to help lift your chest up until you feel a gentle stretch in your abdomen and your lower back. ? This will place some of your body weight on your  elbows. If this is uncomfortable, try stacking pillows under your chest. ? Your hips should stay down, against the surface that you are lying on. Keep your hip and back muscles relaxed. 4. Hold for __________ seconds. 5. Slowly relax your upper body and return to the starting position. Repeat __________ times. Complete this exercise __________ times a day. Strengthening exercises These exercises build strength and endurance in your back. Endurance is the ability to use your muscles for a long time, even after they get tired. Exercise C: Pelvic tilt 1. Lie on your back on a firm surface. Bend your knees and keep your feet flat. 2. Tense your abdominal muscles. Tip your pelvis up toward the ceiling and flatten your lower back into the floor. ? To help with this exercise, you may place a small towel under your lower back and try to push your back into the towel. 3. Hold for __________ seconds. 4. Let your muscles relax completely before you repeat this exercise. Repeat __________ times. Complete this exercise __________ times a day. Exercise D: Alternating arm and leg raises  1. Get on your hands and knees on a firm surface. If you are on a hard floor, you may want to use padding to cushion your knees, such as an exercise mat. 2. Line up your arms and legs. Your hands should be below your shoulders, and your knees should be below your hips.  3. Lift your left leg behind you. At the same time, raise your right arm and straighten it in front of you. ? Do not lift your leg higher than your hip. ? Do not lift your arm higher than your shoulder. ? Keep your abdominal and back muscles tight. ? Keep your hips facing the ground. ? Do not arch your back. ? Keep your balance carefully, and do not hold your breath. 4. Hold for __________ seconds. 5. Slowly return to the starting position and repeat with your right leg and your left arm. Repeat __________ times. Complete this exercise __________times a  day. Exercise J: Single leg lower with bent knees 1. Lie on your back on a firm surface. 2. Tense your abdominal muscles and lift your feet off the floor, one foot at a time, so your knees and hips are bent in an "L" shape (at about 90 degrees). ? Your knees should be over your hips and your lower legs should be parallel to the floor. 3. Keeping your abdominal muscles tense and your knee bent, slowly lower one of your legs so your toe touches the ground. 4. Lift your leg back up to return to the starting position. ? Do not hold your breath. ? Do not let your back arch. Keep your back flat against the ground. 5. Repeat with your other leg. Repeat __________ times. Complete this exercise __________ times a day. Posture and body mechanics  Body mechanics refers to the movements and positions of your body while you do your daily activities. Posture is part of body mechanics. Good posture and healthy body mechanics can help to relieve stress in your body's tissues and joints. Good posture means that your spine is in its natural S-curve position (your spine is neutral), your shoulders are pulled back slightly, and your head is not tipped forward. The following are general guidelines for applying improved posture and body mechanics to your everyday activities. Standing   When standing, keep your spine neutral and your feet about hip-width apart. Keep a slight bend in your knees. Your ears, shoulders, and hips should line up.  When you do a task in which you stand in one place for a long time, place one foot up on a stable object that is 2-4 inches (5-10 cm) high, such as a footstool. This helps keep your spine neutral. Sitting   When sitting, keep your spine neutral and keep your feet flat on the floor. Use a footrest, if necessary, and keep your thighs parallel to the floor. Avoid rounding your shoulders, and avoid tilting your head forward.  When working at a desk or a computer, keep your desk at  a height where your hands are slightly lower than your elbows. Slide your chair under your desk so you are close enough to maintain good posture.  When working at a computer, place your monitor at a height where you are looking straight ahead and you do not have to tilt your head forward or downward to look at the screen. Resting   When lying down and resting, avoid positions that are most painful for you.  If you have pain with activities such as sitting, bending, stooping, or squatting (flexion-based activities), lie in a position in which your body does not bend very much. For example, avoid curling up on your side with your arms and knees near your chest (fetal position).  If you have pain with activities such as standing for a long time or reaching with  your arms (extension-based activities), lie with your spine in a neutral position and bend your knees slightly. Try the following positions: ? Lying on your side with a pillow between your knees. ? Lying on your back with a pillow under your knees. Lifting   When lifting objects, keep your feet at least shoulder-width apart and tighten your abdominal muscles.  Bend your knees and hips and keep your spine neutral. It is important to lift using the strength of your legs, not your back. Do not lock your knees straight out.  Always ask for help to lift heavy or awkward objects. This information is not intended to replace advice given to you by your health care provider. Make sure you discuss any questions you have with your health care provider. Document Released: 02/10/2005 Document Revised: 10/18/2015 Document Reviewed: 11/22/2014 Elsevier Interactive Patient Education  2018 [employer].   IF you received an x-ray today, you will receive an invoice from [facility]. Please contact [facility] at [phone] with questions or concerns regarding your invoice.   IF you received labwork today, you will receive an  invoice from [facility]. Please contact [facility] at [phone] with questions or concerns regarding your invoice.   Our billing staff will not be able to assist you with questions regarding bills from these companies.  You will be contacted with the lab results as soon as they are available. The fastest way to get your results is to activate your My Chart account. Instructions are located on the last page of this paperwork. If you have not heard from [place] regarding the results in 2 weeks, please contact this office.

## 2016-09-01 NOTE — Telephone Encounter (Signed)
Ms Freshour called and stated that her GYN - Dr. Newton Pigg recommended her to you.  Do you want to take pt?

## 2016-09-01 NOTE — Progress Notes (Signed)
   LOA IDLER  MRN: [id number] DOB: 12-14-1950  PCP: Patient, No Pcp Per  Subjective:  Pt is a 66 year old female who presents to clinic for back pain. She was setting up a new sun umbrella on her back porch yesterday when she strained her back. Pain is located on the lower right side. Does not radiate. Worse with sitting for long periods of time. She cannot find a comfortable position. Denies n/t, muscle weakness, bony tenderness.   H/o anxiety - Klonopin 0.5mg  - she takes this PRN. Needs refill.   Review of Systems  Musculoskeletal: Positive for back pain. Negative for gait problem, neck pain and neck stiffness.  Neurological: Negative for weakness and numbness.  Psychiatric/Behavioral: Positive for sleep disturbance.    Patient Active Problem List   Diagnosis Date Noted  . Dysplasia of cervix, high grade CIN 2 08/21/2016  . Abnormal Pap smear 12/08/2012    Current Outpatient Prescriptions on File Prior to Visit  Medication Sig Dispense Refill  . clonazePAM (KLONOPIN) 0.5 MG tablet Take 1 tablet (0.5 mg total) by mouth daily. Panic attacks (Patient taking differently: Take 0.5 mg by mouth daily. Panic attacks - as needed) 30 tablet 1  . Dermatological Products, Misc. (NEOSALUS) CREA Apply 1 application topically daily as needed. Reported on 03/06/2015 100 g 1  . halobetasol (ULTRAVATE) 0.05 % ointment APPLY TO AFFECTED AREA TWICE DAILY 50 g 0   No current facility-administered medications on file prior to visit.     Allergies  Allergen Reactions  . Sulfa Antibiotics Hives  . Penicillins Rash     Objective:  BP 133/78 (BP Location: Right Arm, Patient Position: Sitting, Cuff Size: Normal)   Pulse 70   Temp (!) 97.3 F (36.3 C) (Oral)   Resp 18   Ht 5' 3.5" (1.613 m)   Wt 163 lb 3.2 oz (74 kg)   LMP 02/24/2002   SpO2 98%   BMI 28.46 kg/m   Physical Exam  Constitutional: She is oriented to person, place, and time and well-developed, well-nourished, and in no  distress.  Musculoskeletal:       Lumbar back: She exhibits tenderness and spasm. She exhibits normal range of motion, no bony tenderness and no deformity.  Neurological: She is alert and oriented to person, place, and time. Gait normal.  Psychiatric: Mood, memory, affect and judgment normal.    Assessment and Plan :  1. Muscle spasm of back 2. Acute right-sided thoracic back pain - cyclobenzaprine (FLEXERIL) 10 MG tablet; Take 1 tablet (10 mg total) by mouth 3 (three) times daily as needed for muscle spasms.  Dispense: 30 tablet; Refill: 0 - meloxicam (MOBIC) 15 MG tablet; Take 1 tablet (15 mg total) by mouth daily.  Dispense: 30 tablet; Refill: 1 - Encouraged heat, stretching. RTC in 4-6 weeks if no improvement. Consider physical therapy.  3. Generalized anxiety disorder - clonazePAM (KLONOPIN) 0.5 MG tablet; Take 1 tablet (0.5 mg total) by mouth daily. Panic attacks - as needed  Dispense: 30 tablet; Refill: 1 - Last refill 07/2015 #30, 1 refill. OK to refill.   Mercer Pod, PA-C  [facility] 09/01/2016 3:40 PM

## 2016-09-02 NOTE — Telephone Encounter (Signed)
LM with pt to schedule

## 2016-09-02 NOTE — Telephone Encounter (Signed)
Alicia Wilkerson will you call to schedule her please.

## 2016-09-02 NOTE — Telephone Encounter (Signed)
I will accept her 

## 2016-10-09 NOTE — Progress Notes (Signed)
Subjective:    Patient ID: Alicia Wilkerson, female    DOB: 05/27/1950, 66 y.o.   MRN: [id number]  HPI She is here to establish with a new pcp.  She was referred to me by her GYN, Dr Sabra Heck.     Her medical history and family history was reviewed.  She has no concerns.   Anxiety:  She has occasional anxiety and this is chronic.  She has been taking clonazepam as needed for years and it works well.  She denies side effects.  She does take it less than when she was younger.  She wants to continue it and would like me to prescribe it.      Medications and allergies reviewed with patient and updated if appropriate.  Patient Active Problem List   Diagnosis Date Noted  . Anxiety 10/10/2016  . Eczema 10/10/2016  . Back spasm 10/10/2016  . H/O splenectomy 10/10/2016  . H/O basal cell carcinoma excision 10/10/2016  . Dysplasia of cervix, high grade CIN 2 08/21/2016  . Abnormal Pap smear 12/08/2012    Current Outpatient Prescriptions on File Prior to Visit  Medication Sig Dispense Refill  . clonazePAM (KLONOPIN) 0.5 MG tablet Take 1 tablet (0.5 mg total) by mouth daily. Panic attacks - as needed 30 tablet 1  . Dermatological Products, Misc. (NEOSALUS) CREA Apply 1 application topically daily as needed. Reported on 03/06/2015 100 g 1  . halobetasol (ULTRAVATE) 0.05 % ointment APPLY TO AFFECTED AREA TWICE DAILY 50 g 0   No current facility-administered medications on file prior to visit.     Past Medical History:  Diagnosis Date  . Anemia   . Anxiety   . Basal cell carcinoma (BCC) of left upper arm 12/2015  . Basal cell carcinoma (BCC) of right lower leg 04/2016  . Blood transfusion without reported diagnosis   . Dysplasia of cervix, low grade (CIN 1) 05/08/11  . Hyperparathyroidism, secondary ([place])    due to excess Vit D, normalized off Vit D, never treated  . Osteopenia   . Osteoporosis   . Pelvic fracture ([place])    from fall    Past Surgical History:  Procedure Laterality  Date  . ANKLE SURGERY  03/14/2015   . BASAL CELL CARCINOMA EXCISION     Right arm 2017, and left leg 2018  . FRACTURE SURGERY    . MENISCUS REPAIR Right 1991  . SPLENECTOMY, TOTAL  1954   due to spherocytosis  . TUBAL LIGATION  1995    Social History   Social History  . Marital status: Widowed    Spouse name: N/A  . Number of children: N/A  . Years of education: N/A   Social History Main Topics  . Smoking status: Never Smoker  . Smokeless tobacco: Never Used  . Alcohol use Yes     Comment: glass of wine 6 x year  . Drug use: No  . Sexual activity: No     Comment: BTL   Other Topics Concern  . None   Social History Narrative  . None    Family History  Problem Relation Age of Onset  . Heart failure Father   . Skin cancer Father   . Hypertension Father   . Heart disease Father   . Osteoporosis Mother   . Colon cancer Mother        deceased  . Heart disease Sister     Review of Systems  Constitutional: Negative for chills and fever.  Respiratory: Negative for cough, shortness of breath and wheezing.   Cardiovascular: Negative for chest pain, palpitations and leg swelling.  Gastrointestinal: Positive for nausea (occasional - takes promethazine as needed). Negative for abdominal pain, blood in stool, constipation and diarrhea.  Genitourinary: Negative for dysuria and hematuria.  Musculoskeletal: Positive for back pain (rare).  Neurological: Negative for dizziness, light-headedness and headaches.  Psychiatric/Behavioral: The patient is nervous/anxious (situational).        Objective:   Vitals:   10/10/16 0811  BP: 124/80  Pulse: 68  Temp: 98.1 F (36.7 C)  SpO2: 99%   Filed Weights   10/10/16 0811  Weight: 161 lb (73 kg)   Body mass index is 28.07 kg/m.  Wt Readings from Last 3 Encounters:  10/10/16 161 lb (73 kg)  09/01/16 163 lb 3.2 oz (74 kg)  08/21/16 161 lb (73 kg)     Physical Exam  Constitutional: She appears well-developed and  well-nourished. No distress.  HENT:  Head: Normocephalic and atraumatic.  Right Ear: External ear normal. Normal ear canal and TM Left Ear: External ear normal.  Normal ear canal and TM Mouth/Throat: Oropharynx is clear and moist.  Eyes: Conjunctivae and EOM are normal.  Neck: Neck supple. No tracheal deviation present. No thyromegaly present.  No carotid bruit  Cardiovascular: Normal rate, regular rhythm and normal heart sounds.   No murmur heard.  No edema. Pulmonary/Chest: Effort normal and breath sounds normal. No respiratory distress. She has no wheezes. She has no rales.  Breast: deferred to Gyn Abdominal: Soft. She exhibits no distension. There is no tenderness.  Lymphadenopathy: She has no cervical adenopathy.  Skin: Skin is warm and dry. She is not diaphoretic.  Psychiatric: She has a normal mood and affect. Her behavior is normal.        Assessment & Plan:   See Problem List for Assessment and Plan of chronic medical problems.

## 2016-10-10 ENCOUNTER — Encounter: Payer: Self-pay | Admitting: Internal Medicine

## 2016-10-10 ENCOUNTER — Ambulatory Visit (INDEPENDENT_AMBULATORY_CARE_PROVIDER_SITE_OTHER): Payer: BLUE CROSS/BLUE SHIELD | Admitting: Internal Medicine

## 2016-10-10 VITALS — BP 124/80 | HR 68 | Temp 98.1°F | Ht 63.5 in | Wt 161.0 lb

## 2016-10-10 DIAGNOSIS — Z9889 Other specified postprocedural states: Secondary | ICD-10-CM

## 2016-10-10 DIAGNOSIS — Z85828 Personal history of other malignant neoplasm of skin: Secondary | ICD-10-CM | POA: Insufficient documentation

## 2016-10-10 DIAGNOSIS — M62830 Muscle spasm of back: Secondary | ICD-10-CM | POA: Insufficient documentation

## 2016-10-10 DIAGNOSIS — L309 Dermatitis, unspecified: Secondary | ICD-10-CM | POA: Diagnosis not present

## 2016-10-10 DIAGNOSIS — F419 Anxiety disorder, unspecified: Secondary | ICD-10-CM | POA: Diagnosis not present

## 2016-10-10 DIAGNOSIS — Z9081 Acquired absence of spleen: Secondary | ICD-10-CM | POA: Insufficient documentation

## 2016-10-10 NOTE — Patient Instructions (Signed)
  All other Health Maintenance issues reviewed.   All recommended immunizations and age-appropriate screenings are up-to-date or discussed.  No immunizations administered today.   Medications reviewed and updated.  No changes recommended at this time.   Please followup in annually for a physical

## 2016-10-10 NOTE — Assessment & Plan Note (Signed)
Takes it as needed only  Has been on it for years No side effects Will continue  - I will prescribe

## 2016-10-10 NOTE — Assessment & Plan Note (Signed)
Stress is her trigger Controlled with topical creams

## 2016-10-22 ENCOUNTER — Telehealth: Payer: Self-pay | Admitting: Obstetrics & Gynecology

## 2016-10-22 NOTE — Telephone Encounter (Signed)
Routing to Dr.Miller and Karmen Bongo, RN for review.

## 2016-10-22 NOTE — Telephone Encounter (Signed)
Patient checking on a form she dropped off last week for Dr Sabra Heck to complete. Ok to leave a detailed message letting her know the status.

## 2016-10-23 NOTE — Telephone Encounter (Signed)
Patient calling to check status on message from yesterday.

## 2016-10-23 NOTE — Telephone Encounter (Signed)
Spoke with patient. Patient states she dropped a form off at our office on 10/13/2016 with the front desk. Front desk reviewed form with staff and got approval that it could be completed. States it for reimbursement through [facility] for lab work. Calling to check on status. Have reviewed with Raquel Sarna. Advised patient will need to review with Dr.Miller upon return to the office tomorrow and return call. Patient is agreeable. Patient requests the form be faxed to number on the form and a mailed copy be sent to address on file.

## 2016-10-30 NOTE — Telephone Encounter (Addendum)
Patient called to check on the status of this. Routing to provider for FYI and provider's nurse as well.

## 2016-11-03 NOTE — Telephone Encounter (Signed)
Spoke with patient. Advised patient reviewed with Dr.Miller who has paper work and is working on it. Advised once complete this will be faxed and a copy will be mailed to her home address on file per her request.

## 2016-11-07 ENCOUNTER — Encounter: Payer: Self-pay | Admitting: Internal Medicine

## 2016-11-07 ENCOUNTER — Ambulatory Visit (INDEPENDENT_AMBULATORY_CARE_PROVIDER_SITE_OTHER)
Admission: RE | Admit: 2016-11-07 | Discharge: 2016-11-07 | Disposition: A | Discharge status: Home / Self Care | Payer: BLUE CROSS/BLUE SHIELD | Source: Ambulatory Visit | Attending: Internal Medicine | Admitting: Internal Medicine

## 2016-11-07 ENCOUNTER — Telehealth: Payer: Self-pay

## 2016-11-07 ENCOUNTER — Ambulatory Visit (INDEPENDENT_AMBULATORY_CARE_PROVIDER_SITE_OTHER): Payer: BLUE CROSS/BLUE SHIELD | Admitting: Internal Medicine

## 2016-11-07 VITALS — BP 128/82 | HR 83 | Temp 98.4°F | Ht 63.5 in | Wt 155.0 lb

## 2016-11-07 DIAGNOSIS — R059 Cough, unspecified: Secondary | ICD-10-CM

## 2016-11-07 DIAGNOSIS — R05 Cough: Secondary | ICD-10-CM

## 2016-11-07 DIAGNOSIS — B370 Candidal stomatitis: Secondary | ICD-10-CM

## 2016-11-07 DIAGNOSIS — R112 Nausea with vomiting, unspecified: Secondary | ICD-10-CM

## 2016-11-07 MED ORDER — BENZONATATE 100 MG PO CAPS: ORAL_CAPSULE | ORAL | 0 refills | Status: DC

## 2016-11-07 MED ORDER — NYSTATIN 100000 UNIT/ML MT SUSP: 500000.0000 [IU] | Freq: Four times a day (QID) | OROMUCOSAL | 0 refills | Status: AC

## 2016-11-07 MED ORDER — PROMETHAZINE HCL 25 MG PO TABS: 25.0000 mg | ORAL_TABLET | Freq: Four times a day (QID) | ORAL | 1 refills | Status: DC | PRN

## 2016-11-07 MED ORDER — AZITHROMYCIN 250 MG PO TABS: ORAL_TABLET | ORAL | 1 refills | Status: DC

## 2016-11-07 NOTE — Telephone Encounter (Signed)
-----   Message from Biagio Borg, MD sent at 11/07/2016 11:49 AM EDT ----- Letter sent, cont same tx  [facility] [place] for shirron to contact pt - CXR is negative for pneumonia

## 2016-11-07 NOTE — Patient Instructions (Signed)
Please take all new medication as prescribed - the antibiotic, and pills for cough if needed  Please take all new medication as prescribed - the nystatin solution  Please continue all other medications as before, and refills have been done if requested - phenergan  Please have the pharmacy call with any other refills you may need.  Please keep your appointments with your specialists as you may have planned  Please go to the XRAY Department in the Basement (go straight as you get off the elevator) for the x-ray testing  You will be contacted by phone if any changes need to be made immediately.  Otherwise, you will receive a letter about your results with an explanation, but please check with MyChart first.  Please remember to sign up for MyChart if you have not done so, as this will be important to you in the future with finding out test results, communicating by private email, and scheduling acute appointments online when needed.

## 2016-11-07 NOTE — Telephone Encounter (Signed)
Form back to you to copy and scan, fax and send to pt.  Thanks.

## 2016-11-07 NOTE — Assessment & Plan Note (Signed)
Mild to mod, for topical nystatin soln asd,  to f/u any worsening symptoms or concerns

## 2016-11-07 NOTE — Telephone Encounter (Signed)
Pt has been informed and expressed understanding.  

## 2016-11-07 NOTE — Progress Notes (Signed)
Subjective:    Patient ID: Alicia Wilkerson, female    DOB: 1950-10-25, 66 y.o.   MRN: [id number]  HPI  Here with acute onset mild to mod 2-3 days ST, HA, general weakness and malaise, with prod cough greenish sputum, nausea with cough and gag and tries to vomit several times, decreased appetite, 5 lbs wt loss per pt, but Pt denies chest pain, increased sob or doe, wheezing, orthopnea, PND, increased LE swelling, palpitations, dizziness or syncope.  Denies tongue swelling or pain but has new rash to tongue she attributes to not eating much. Past Medical History:  Diagnosis Date  . Anemia   . Anxiety   . Basal cell carcinoma (BCC) of left upper arm 12/2015  . Basal cell carcinoma (BCC) of right lower leg 04/2016  . Blood transfusion without reported diagnosis   . Dysplasia of cervix, low grade (CIN 1) 05/08/11  . Hyperparathyroidism, secondary ([place])    due to excess Vit D, normalized off Vit D, never treated  . Osteopenia   . Osteoporosis   . Pelvic fracture ([place])    from fall   Past Surgical History:  Procedure Laterality Date  . ANKLE SURGERY  03/14/2015   . BASAL CELL CARCINOMA EXCISION     Right arm 2017, and left leg 2018  . FRACTURE SURGERY     ankle  . MENISCUS REPAIR Right 1991  . SPLENECTOMY, TOTAL  1954   due to spherocytosis  . TUBAL LIGATION  1995    reports that she has never smoked. She has never used smokeless tobacco. She reports that she drinks alcohol. She reports that she does not use drugs. family history includes Colon cancer in her mother; Heart disease in her father and sister; Heart failure in her father; Hypertension in her father; Osteoporosis in her mother; Skin cancer in her father. Allergies  Allergen Reactions  . Sulfa Antibiotics Hives  . Penicillins Rash   Current Outpatient Prescriptions on File Prior to Visit  Medication Sig Dispense Refill  . clonazePAM (KLONOPIN) 0.5 MG tablet Take 1 tablet (0.5 mg total) by mouth daily. Panic attacks - as  needed 30 tablet 1  . Dermatological Products, Misc. (NEOSALUS) CREA Apply 1 application topically daily as needed. Reported on 03/06/2015 100 g 1  . halobetasol (ULTRAVATE) 0.05 % ointment APPLY TO AFFECTED AREA TWICE DAILY 50 g 0   No current facility-administered medications on file prior to visit.    Review of Systems  Constitutional: Negative for other unusual diaphoresis or sweats HENT: Negative for ear discharge or swelling Eyes: Negative for other worsening visual disturbances Respiratory: Negative for stridor or other swelling  Gastrointestinal: Negative for worsening distension or other blood Genitourinary: Negative for retention or other urinary change Musculoskeletal: Negative for other MSK pain or swelling Skin: Negative for color change or other new lesions Neurological: Negative for worsening tremors and other numbness  Psychiatric/Behavioral: Negative for worsening agitation or other fatigue All other system neg per pt    Objective:   Physical Exam BP 128/82   Pulse 83   Temp 98.4 F (36.9 C) (Oral)   Ht 5' 3.5" (1.613 m)   Wt 155 lb (70.3 kg)   LMP 02/24/2002   SpO2 96%   BMI 27.03 kg/m  VS noted, mild ill Constitutional: Pt appears in NAD HENT: Head: NCAT.  Tongue with large whitish dorsal tongue rash Right Ear: External ear normal.  Left Ear: External ear normal.  Bilat tm's with mild erythema.  Max sinus areas non tender.  Pharynx with mild erythema, no exudate Eyes: . Pupils are equal, round, and reactive to light. Conjunctivae and EOM are normal Nose: without d/c or deformity Neck: Neck supple. Gross normal ROM Cardiovascular: Normal rate and regular rhythm.   Pulmonary/Chest: Effort normal and breath sounds without rales or wheezing.  Neurological: Pt is alert. At baseline orientation, motor grossly intact Skin: Skin is warm. No rashes, other new lesions, no LE edema Psychiatric: Pt behavior is normal without agitation  No other exam findings       Assessment & Plan:

## 2016-11-07 NOTE — Assessment & Plan Note (Signed)
Likely secondary to illness, for antiemetic prn

## 2016-11-07 NOTE — Assessment & Plan Note (Signed)
Mild to mod, c/w bronchitis vs pna, for cxr, for antibx course, cough med prn,  to f/u any worsening symptoms or concerns 

## 2016-11-10 NOTE — Telephone Encounter (Signed)
Completed form faxed to [phone]. Copy mailed to patient's home address on file. Original to scan.Will close encounter.

## 2016-11-21 ENCOUNTER — Ambulatory Visit: Payer: BLUE CROSS/BLUE SHIELD | Admitting: Obstetrics & Gynecology

## 2017-01-08 ENCOUNTER — Other Ambulatory Visit: Payer: Self-pay | Admitting: Obstetrics & Gynecology

## 2017-01-08 DIAGNOSIS — Z1231 Encounter for screening mammogram for malignant neoplasm of breast: Secondary | ICD-10-CM

## 2017-02-18 ENCOUNTER — Ambulatory Visit
Admission: RE | Admit: 2017-02-18 | Discharge: 2017-02-18 | Disposition: A | Discharge status: Home / Self Care | Payer: BLUE CROSS/BLUE SHIELD | Source: Ambulatory Visit | Attending: Obstetrics & Gynecology | Admitting: Obstetrics & Gynecology

## 2017-02-18 DIAGNOSIS — Z1231 Encounter for screening mammogram for malignant neoplasm of breast: Secondary | ICD-10-CM

## 2017-05-01 DIAGNOSIS — D2261 Melanocytic nevi of right upper limb, including shoulder: Secondary | ICD-10-CM | POA: Diagnosis not present

## 2017-05-01 DIAGNOSIS — D2271 Melanocytic nevi of right lower limb, including hip: Secondary | ICD-10-CM | POA: Diagnosis not present

## 2017-05-01 DIAGNOSIS — D485 Neoplasm of uncertain behavior of skin: Secondary | ICD-10-CM | POA: Diagnosis not present

## 2017-05-01 DIAGNOSIS — L821 Other seborrheic keratosis: Secondary | ICD-10-CM | POA: Diagnosis not present

## 2017-05-01 DIAGNOSIS — L814 Other melanin hyperpigmentation: Secondary | ICD-10-CM | POA: Diagnosis not present

## 2017-05-01 DIAGNOSIS — Z85828 Personal history of other malignant neoplasm of skin: Secondary | ICD-10-CM | POA: Diagnosis not present

## 2017-05-19 DIAGNOSIS — Z85828 Personal history of other malignant neoplasm of skin: Secondary | ICD-10-CM | POA: Diagnosis not present

## 2017-05-19 DIAGNOSIS — C44711 Basal cell carcinoma of skin of unspecified lower limb, including hip: Secondary | ICD-10-CM | POA: Insufficient documentation

## 2017-05-19 DIAGNOSIS — L989 Disorder of the skin and subcutaneous tissue, unspecified: Secondary | ICD-10-CM | POA: Diagnosis not present

## 2017-06-09 DIAGNOSIS — C44712 Basal cell carcinoma of skin of right lower limb, including hip: Secondary | ICD-10-CM | POA: Diagnosis not present

## 2017-06-09 DIAGNOSIS — C44719 Basal cell carcinoma of skin of left lower limb, including hip: Secondary | ICD-10-CM | POA: Diagnosis not present

## 2017-06-25 ENCOUNTER — Other Ambulatory Visit: Payer: Self-pay | Admitting: Internal Medicine

## 2017-06-25 DIAGNOSIS — F411 Generalized anxiety disorder: Secondary | ICD-10-CM

## 2017-06-25 NOTE — Telephone Encounter (Signed)
Pt will need OV for refill due to change in new opioid law. Pt has not seen MD since 09/2016.Marland KitchenJohny Chess

## 2017-06-25 NOTE — Telephone Encounter (Signed)
Copied from [place] [id number]. Topic: Quick Communication - Rx Refill/Question >> Jun 25, 2017 10:43 AM Oliver Pila B wrote:  Pt going out of town and trying to get medication before Saturday, call pt to advise  Medication: clonazePAM (KLONOPIN) 0.5 MG tablet [[id number]], promethazine (PHENERGAN) 25 MG tablet [[id number]]  Has the patient contacted their pharmacy? Yes.   (Agent: If no, request that the patient contact the pharmacy for the refill.) Preferred Pharmacy (with phone number [facility] street [facility] Agent: Please be advised that RX refills may take up to 3 business days. We ask that you follow-up with your pharmacy.

## 2017-06-25 NOTE — Telephone Encounter (Signed)
Patient was told to follow up in one year from 09/2016 visit for her physical. Called patient and left message for her to call back to schedule appointment.

## 2017-06-25 NOTE — Telephone Encounter (Signed)
Pt requesting refill of Clonazepam 0.5mg , last filed on 09/01/16 #30 and Promethazine 25mg , last filled on 11/07/16#30   LOV: 10/10/16 Dr. Quay Burow  [facility]  [address] [place]

## 2017-06-26 MED ORDER — PROMETHAZINE HCL 25 MG PO TABS: 25.0000 mg | ORAL_TABLET | Freq: Four times a day (QID) | ORAL | 0 refills | Status: DC | PRN

## 2017-06-26 MED ORDER — CLONAZEPAM 0.5 MG PO TABS: 0.5000 mg | ORAL_TABLET | Freq: Every day | ORAL | 0 refills | Status: DC

## 2017-06-26 NOTE — Telephone Encounter (Signed)
pls advise if ok for refill Check [place] registry last filled 09/26/2016.Marland KitchenJohny Chess

## 2017-06-26 NOTE — Telephone Encounter (Signed)
Let her know prescriptions have been sent

## 2017-06-26 NOTE — Telephone Encounter (Signed)
Notified pt [facility].../l;mb

## 2017-06-26 NOTE — Telephone Encounter (Signed)
Patient called in and scheduled her CPE for 8/20 at 8am. She is requesting the medications to be re-filled today as she is flying out tomorrow. Please advise.

## 2017-08-31 ENCOUNTER — Ambulatory Visit: Payer: BLUE CROSS/BLUE SHIELD | Admitting: Obstetrics & Gynecology

## 2017-09-11 ENCOUNTER — Telehealth: Payer: Self-pay | Admitting: Obstetrics & Gynecology

## 2017-09-11 NOTE — Telephone Encounter (Signed)
Left message regarding upcoming aex appointment 09/18/17 with Dr.Miller has been canceled and needs to be rescheduled.

## 2017-09-18 ENCOUNTER — Ambulatory Visit: Payer: BLUE CROSS/BLUE SHIELD | Admitting: Obstetrics & Gynecology

## 2017-09-21 ENCOUNTER — Other Ambulatory Visit: Payer: Self-pay

## 2017-09-21 ENCOUNTER — Ambulatory Visit (INDEPENDENT_AMBULATORY_CARE_PROVIDER_SITE_OTHER): Payer: Medicare HMO | Admitting: Obstetrics & Gynecology

## 2017-09-21 ENCOUNTER — Other Ambulatory Visit (HOSPITAL_COMMUNITY)
Admission: RE | Admit: 2017-09-21 | Discharge: 2017-09-21 | Disposition: A | Discharge status: Home / Self Care | Payer: Medicare HMO | Source: Ambulatory Visit | Attending: Obstetrics & Gynecology | Admitting: Obstetrics & Gynecology

## 2017-09-21 ENCOUNTER — Encounter: Payer: Self-pay | Admitting: Obstetrics & Gynecology

## 2017-09-21 VITALS — BP 130/70 | HR 72 | Resp 16 | Ht 63.5 in | Wt 150.4 lb

## 2017-09-21 DIAGNOSIS — Z01419 Encounter for gynecological examination (general) (routine) without abnormal findings: Secondary | ICD-10-CM | POA: Diagnosis not present

## 2017-09-21 DIAGNOSIS — N871 Moderate cervical dysplasia: Secondary | ICD-10-CM | POA: Diagnosis not present

## 2017-09-21 NOTE — Progress Notes (Signed)
67 y.o. [id number] WidowedCaucasianF here for annual exam.  Denies vaginal bleeding.  No new sexual partner.  PCP:  Dr. Quay Burow  Patient's last menstrual period was 02/24/2002.          Sexually active: No.  The current method of family planning is post menopausal status.    Exercising: Yes.    walking  Smoker:  no  Health Maintenance: Pap:  08/21/16 Neg. HR HPV:neg   02/15/16 Neg. HR HPV:neg  History of abnormal Pap:  Yes, LEEP 2017 MMG:  02/18/17 BIRADS1:Neg  Colonoscopy:  04/2013 Normal.  F/u 5 years. BMD:   01/05/15 osteoporosis.  Does not desire to repeat this currently.  Would not currently take medication.   TDaP:  08/21/2016 Pneumonia vaccine(s):  Done  Shingrix:   Has discussed with Dr. Quay Burow Hep C testing: 07/2015 neg  Screening Labs: PCP   reports that she has never smoked. She has never used smokeless tobacco. She reports that she drinks alcohol. She reports that she does not use drugs.  Past Medical History:  Diagnosis Date  . Anemia   . Anxiety   . Basal cell carcinoma (BCC) of left upper arm 12/2015  . Basal cell carcinoma (BCC) of right lower leg 04/2016  . Blood transfusion without reported diagnosis   . Dysplasia of cervix, low grade (CIN 1) 05/08/11  . Hyperparathyroidism, secondary ([place])    due to excess Vit D, normalized off Vit D, never treated  . Osteopenia   . Osteoporosis   . Pelvic fracture ([place])    from fall    Past Surgical History:  Procedure Laterality Date  . ANKLE SURGERY  03/14/2015   . BASAL CELL CARCINOMA EXCISION     Right arm 2017, and left leg 2018  . FRACTURE SURGERY     ankle  . MENISCUS REPAIR Right 1991  . SPLENECTOMY, TOTAL  1954   due to spherocytosis  . TUBAL LIGATION  1995    Current Outpatient Medications  Medication Sig Dispense Refill  . clonazePAM (KLONOPIN) 0.5 MG tablet Take 1 tablet (0.5 mg total) by mouth daily. Panic attacks - as needed 30 tablet 0  . Dermatological Products, Misc. (NEOSALUS) CREA Apply 1 application  topically daily as needed. Reported on 03/06/2015 100 g 1  . halobetasol (ULTRAVATE) 0.05 % ointment APPLY TO AFFECTED AREA TWICE DAILY 50 g 0  . promethazine (PHENERGAN) 25 MG tablet Take 1 tablet (25 mg total) by mouth every 6 (six) hours as needed for nausea or vomiting. 30 tablet 0   No current facility-administered medications for this visit.     Family History  Problem Relation Age of Onset  . Heart failure Father   . Skin cancer Father   . Hypertension Father   . Heart disease Father   . Osteoporosis Mother   . Colon cancer Mother        deceased  . Heart disease Sister     Review of Systems  All other systems reviewed and are negative.   Exam:   BP 130/70 (BP Location: Right Arm, Patient Position: Sitting, Cuff Size: Normal)   Pulse 72   Resp 16   Ht 5' 3.5" (1.613 m)   Wt 150 lb 6.4 oz (68.2 kg)   LMP 02/24/2002   BMI 26.22 kg/m   Height: 5' 3.5" (161.3 cm)  Ht Readings from Last 3 Encounters:  09/21/17 5' 3.5" (1.613 m)  11/07/16 5' 3.5" (1.613 m)  10/10/16 5' 3.5" (1.613 m)  General appearance: alert, cooperative and appears stated age Head: Normocephalic, without obvious abnormality, atraumatic Neck: no adenopathy, supple, symmetrical, trachea midline and thyroid normal to inspection and palpation Lungs: clear to auscultation bilaterally Breasts: normal appearance, no masses or tenderness Heart: regular rate and rhythm Abdomen: soft, non-tender; bowel sounds normal; no masses,  no organomegaly Extremities: extremities normal, atraumatic, no cyanosis or edema Skin: Skin color, texture, turgor normal. No rashes or lesions Lymph nodes: Cervical, supraclavicular, and axillary nodes normal. No abnormal inguinal nodes palpated Neurologic: Grossly normal   Pelvic: External genitalia:  no lesions              Urethra:  normal appearing urethra with no masses, tenderness or lesions              Bartholins and Skenes: normal                 Vagina: normal  appearing vagina with normal color and discharge, no lesions              Cervix: no lesions              Pap taken: Yes.   Bimanual Exam:  Uterus:  normal size, contour, position, consistency, mobility, non-tender              Adnexa: normal adnexa and no mass, fullness, tenderness               Rectovaginal: Confirms               Anus:  normal sphincter tone, no lesions  Chaperone was present for exam.  A:  Well Woman with normal exam PMP, no HRT H/O CIN 2 s/p LEEP 6/17, normal paps since H/O family hx of colon cancer, mother Osteoporosis.  Stable over >10 years.  P:   Mammogram guidelines reviewed pap smear with HR HPV obtained today Lab work will be done with Dr. Quay Burow Pt aware knows she will need to have the pneumovax vaccination this year Declines doing BMD this year.  Would not treat it at this time. return annually or prn

## 2017-09-22 LAB — CYTOLOGY - PAP
Diagnosis: NEGATIVE
HPV: NOT DETECTED

## 2017-09-28 ENCOUNTER — Telehealth: Payer: Self-pay | Admitting: *Deleted

## 2017-09-28 NOTE — Telephone Encounter (Signed)
LM for pt to call back.

## 2017-09-28 NOTE — Telephone Encounter (Signed)
Patient returned call to [facility] and scheduled an AEX for 09/22/17.

## 2017-09-28 NOTE — Telephone Encounter (Signed)
-----   Message from Megan Salon, MD sent at 09/26/2017 11:21 AM EDT ----- Called pt to give results per her request.  Pap was negative and HR HPV was negative.  DPR reviewed and detailed message given.  08 recall.  appt scheduled for 11/20.  Needs to be moved up to one year.  Can you call and take care of that?  Thanks.

## 2017-10-11 NOTE — Patient Instructions (Addendum)
Alicia Wilkerson , Thank you for taking time to come for your Medicare Wellness Visit. I appreciate your ongoing commitment to your health goals. Please review the following plan we discussed and let me know if I can assist you in the future.   These are the goals we discussed: Goals   Exercise regularly     This is a list of the screening recommended for you and due dates:  Health Maintenance  Topic Date Due  . Pneumonia vaccines (1 of 2 - PCV13) 04/19/2017  . Flu Shot  09/24/2017  . DEXA scan (bone density measurement)  10/14/2018*  . Mammogram  02/19/2019  . Pap Smear  09/21/2020  . Colon Cancer Screening  05/24/2023  . Tetanus Vaccine  08/22/2026  .  Hepatitis C: One time screening is recommended by Center for Disease Control  (CDC) for  adults born from [birth year] through 1963.   Completed  . HIV Screening  Completed  *Topic was postponed. The date shown is not the original due date.    Test(s) ordered today. Your results will be released to [place] (or called to you) after review, usually within 72hours after test completion. If any changes need to be made, you will be notified at that same time.  All other Health Maintenance issues reviewed.   All recommended immunizations and age-appropriate screenings are up-to-date or discussed.  No immunizations administered today.   An EKG was done today  Medications reviewed and updated.  No changes recommended at this time.  Your prescription(s) have been submitted to your pharmacy. Please take as directed and contact our office if you believe you are having problem(s) with the medication(s).  A referral was ordered for audiology.  Please followup in 6 months   Health Maintenance, Female Adopting a healthy lifestyle and getting preventive care can go a long way to promote health and wellness. Talk with your health care provider about what schedule of regular examinations is right for you. This is a good chance for you to check in with your  provider about disease prevention and staying healthy. In between checkups, there are plenty of things you can do on your own. Experts have done a lot of research about which lifestyle changes and preventive measures are most likely to keep you healthy. Ask your health care provider for more information. Weight and diet Eat a healthy diet  Be sure to include plenty of vegetables, fruits, low-fat dairy products, and lean protein.  Do not eat a lot of foods high in solid fats, added sugars, or salt.  Get regular exercise. This is one of the most important things you can do for your health. ? Most adults should exercise for at least 150 minutes each week. The exercise should increase your heart rate and make you sweat (moderate-intensity exercise). ? Most adults should also do strengthening exercises at least twice a week. This is in addition to the moderate-intensity exercise.  Maintain a healthy weight  Body mass index (BMI) is a measurement that can be used to identify possible weight problems. It estimates body fat based on height and weight. Your health care provider can help determine your BMI and help you achieve or maintain a healthy weight.  For females 73 years of age and older: ? A BMI below 18.5 is considered underweight. ? A BMI of 18.5 to 24.9 is normal. ? A BMI of 25 to 29.9 is considered overweight. ? A BMI of 30 and above is considered obese.  Watch  levels of cholesterol and blood lipids  You should start having your blood tested for lipids and cholesterol at 65 years of age, then have this test every 5 years.  You may need to have your cholesterol levels checked more often if: ? Your lipid or cholesterol levels are high. ? You are older than 65 years of age. ? You are at high risk for heart disease.  Cancer screening Lung Cancer  Lung cancer screening is recommended for adults 20-67 years old who are at high risk for lung cancer because of a history of smoking.  A  yearly low-dose CT scan of the lungs is recommended for people who: ? Currently smoke. ? Have quit within the past 15 years. ? Have at least a 30-pack-year history of smoking. A pack year is smoking an average of one pack of cigarettes a day for 1 year.  Yearly screening should continue until it has been 15 years since you quit.  Yearly screening should stop if you develop a health problem that would prevent you from having lung cancer treatment.  Breast Cancer  Practice breast self-awareness. This means understanding how your breasts normally appear and feel.  It also means doing regular breast self-exams. Let your health care provider know about any changes, no matter how small.  If you are in your 20s or 30s, you should have a clinical breast exam (CBE) by a health care provider every 1-3 years as part of a regular health exam.  If you are 35 or older, have a CBE every year. Also consider having a breast X-ray (mammogram) every year.  If you have a family history of breast cancer, talk to your health care provider about genetic screening.  If you are at high risk for breast cancer, talk to your health care provider about having an MRI and a mammogram every year.  Breast cancer gene (BRCA) assessment is recommended for women who have family members with BRCA-related cancers. BRCA-related cancers include: ? Breast. ? Ovarian. ? Tubal. ? Peritoneal cancers.  Results of the assessment will determine the need for genetic counseling and BRCA1 and BRCA2 testing.  Cervical Cancer Your health care provider may recommend that you be screened regularly for cancer of the pelvic organs (ovaries, uterus, and vagina). This screening involves a pelvic examination, including checking for microscopic changes to the surface of your cervix (Pap test). You may be encouraged to have this screening done every 3 years, beginning at age 64.  For women ages 24-65, health care providers may recommend  pelvic exams and Pap testing every 3 years, or they may recommend the Pap and pelvic exam, combined with testing for human papilloma virus (HPV), every 5 years. Some types of HPV increase your risk of cervical cancer. Testing for HPV may also be done on women of any age with unclear Pap test results.  Other health care providers may not recommend any screening for nonpregnant women who are considered low risk for pelvic cancer and who do not have symptoms. Ask your health care provider if a screening pelvic exam is right for you.  If you have had past treatment for cervical cancer or a condition that could lead to cancer, you need Pap tests and screening for cancer for at least 20 years after your treatment. If Pap tests have been discontinued, your risk factors (such as having a new sexual partner) need to be reassessed to determine if screening should resume. Some women have medical problems that increase the  chance of getting cervical cancer. In these cases, your health care provider may recommend more frequent screening and Pap tests.  Colorectal Cancer  This type of cancer can be detected and often prevented.  Routine colorectal cancer screening usually begins at 65 years of age and continues through 65 years of age.  Your health care provider may recommend screening at an earlier age if you have risk factors for colon cancer.  Your health care provider may also recommend using home test kits to check for hidden blood in the stool.  A small camera at the end of a tube can be used to examine your colon directly (sigmoidoscopy or colonoscopy). This is done to check for the earliest forms of colorectal cancer.  Routine screening usually begins at age 29.  Direct examination of the colon should be repeated every 5-10 years through 65 years of age. However, you may need to be screened more often if early forms of precancerous polyps or small growths are found.  Skin Cancer  Check your skin  from head to toe regularly.  Tell your health care provider about any new moles or changes in moles, especially if there is a change in a mole's shape or color.  Also tell your health care provider if you have a mole that is larger than the size of a pencil eraser.  Always use sunscreen. Apply sunscreen liberally and repeatedly throughout the day.  Protect yourself by wearing long sleeves, pants, a wide-brimmed hat, and sunglasses whenever you are outside.  Heart disease, diabetes, and high blood pressure  High blood pressure causes heart disease and increases the risk of stroke. High blood pressure is more likely to develop in: ? People who have blood pressure in the high end of the normal range (130-139/85-89 mm Hg). ? People who are overweight or obese. ? People who are African American.  If you are 59-62 years of age, have your blood pressure checked every 3-5 years. If you are 78 years of age or older, have your blood pressure checked every year. You should have your blood pressure measured twice-once when you are at a hospital or clinic, and once when you are not at a hospital or clinic. Record the average of the two measurements. To check your blood pressure when you are not at a hospital or clinic, you can use: ? An automated blood pressure machine at a pharmacy. ? A home blood pressure monitor.  If you are between 70 years and 45 years old, ask your health care provider if you should take aspirin to prevent strokes.  Have regular diabetes screenings. This involves taking a blood sample to check your fasting blood sugar level. ? If you are at a normal weight and have a low risk for diabetes, have this test once every three years after 65 years of age. ? If you are overweight and have a high risk for diabetes, consider being tested at a younger age or more often. Preventing infection Hepatitis B  If you have a higher risk for hepatitis B, you should be screened for this virus. You  are considered at high risk for hepatitis B if: ? You were born in a country where hepatitis B is common. Ask your health care provider which countries are considered high risk. ? Your parents were born in a high-risk country, and you have not been immunized against hepatitis B (hepatitis B vaccine). ? You have HIV or AIDS. ? You use needles to inject street drugs. ?  You live with someone who has hepatitis B. ? You have had sex with someone who has hepatitis B. ? You get hemodialysis treatment. ? You take certain medicines for conditions, including cancer, organ transplantation, and autoimmune conditions.  Hepatitis C  Blood testing is recommended for: ? Everyone born from [birth year] through 1963. ? Anyone with known risk factors for hepatitis C.  Sexually transmitted infections (STIs)  You should be screened for sexually transmitted infections (STIs) including gonorrhea and chlamydia if: ? You are sexually active and are younger than 65 years of age. ? You are older than 65 years of age and your health care provider tells you that you are at risk for this type of infection. ? Your sexual activity has changed since you were last screened and you are at an increased risk for chlamydia or gonorrhea. Ask your health care provider if you are at risk.  If you do not have HIV, but are at risk, it may be recommended that you take a prescription medicine daily to prevent HIV infection. This is called pre-exposure prophylaxis (PrEP). You are considered at risk if: ? You are sexually active and do not regularly use condoms or know the HIV status of your partner(s). ? You take drugs by injection. ? You are sexually active with a partner who has HIV.  Talk with your health care provider about whether you are at high risk of being infected with HIV. If you choose to begin PrEP, you should first be tested for HIV. You should then be tested every 3 months for as long as you are taking PrEP. Pregnancy  If  you are premenopausal and you may become pregnant, ask your health care provider about preconception counseling.  If you may become pregnant, take 400 to 800 micrograms (mcg) of folic acid every day.  If you want to prevent pregnancy, talk to your health care provider about birth control (contraception). Osteoporosis and menopause  Osteoporosis is a disease in which the bones lose minerals and strength with aging. This can result in serious bone fractures. Your risk for osteoporosis can be identified using a bone density scan.  If you are 39 years of age or older, or if you are at risk for osteoporosis and fractures, ask your health care provider if you should be screened.  Ask your health care provider whether you should take a calcium or vitamin D supplement to lower your risk for osteoporosis.  Menopause may have certain physical symptoms and risks.  Hormone replacement therapy may reduce some of these symptoms and risks. Talk to your health care provider about whether hormone replacement therapy is right for you. Follow these instructions at home:  Schedule regular health, dental, and eye exams.  Stay current with your immunizations.  Do not use any tobacco products including cigarettes, chewing tobacco, or electronic cigarettes.  If you are pregnant, do not drink alcohol.  If you are breastfeeding, limit how much and how often you drink alcohol.  Limit alcohol intake to no more than 1 drink per day for nonpregnant women. One drink equals 12 ounces of beer, 5 ounces of wine, or 1 ounces of hard liquor.  Do not use street drugs.  Do not share needles.  Ask your health care provider for help if you need support or information about quitting drugs.  Tell your health care provider if you often feel depressed.  Tell your health care provider if you have ever been abused or do not feel safe at  home. This information is not intended to replace advice given to you by your health  care provider. Make sure you discuss any questions you have with your health care provider. Document Released: 08/26/2010 Document Revised: 07/19/2015 Document Reviewed: 11/14/2014 Elsevier Interactive Patient Education  [employer].

## 2017-10-11 NOTE — Progress Notes (Signed)
Subjective:    Patient ID: Alicia Wilkerson, female    DOB: 1950-05-17, 67 y.o.   MRN: [id number]  HPI Here for welcome to medicare wellness exam and a physical exam.   I have personally reviewed and have noted 1.The patient's medical and social history 2.Their use of alcohol, tobacco or illicit drugs 3.Their current medications and supplements 4.The patient's functional ability including ADL's, fall risks, home                 safety risk and hearing or visual impairment. 5.Diet and physical activities 6.Evidence for depression or mood disorders 7.Care team reviewed  -  Derm - Dr Amy Martinique, Gyn  - Dr Hale Bogus, plastic surgery - Dr Marla Roe for skin cancer    Are there smokers in your home (other than you)? No  Risk Factors Exercise:   Walking but not always regular Dietary issues discussed:   Well balanced, difficulty with portions - eats 2 meals a day  Vitamin and supplement use:   none  Opiod use:    none Side effects from medication:   Na  Does medications benefits outweigh risks/side effects:   na  Cardiac risk factors: advanced age  Depression Screen  Have you felt down, depressed or hopeless? No  Have you felt little interest or pleasure in doing things?  No  Activities of Daily Living In your present state of health, do you have any difficulty performing the following activities?:  Driving? No Managing money?  No Feeding yourself? No Getting from bed to chair? No Climbing a flight of stairs? No Preparing food and eating?: No Bathing or showering? No Getting dressed: No Getting to/using the toilet? No Moving around from place to place: No In the past year have you fallen or had a near fall?: yes - in [place] - cracked tooth, no other injuries   Are you sexually active?  No  Do you have more than one partner?  N/A  Hearing Difficulties:  Do you often ask people to speak up or repeat  themselves? No Do you experience ringing or noises in your ears? No Do you have difficulty understanding soft or whispered voices? yes Vision:              Any change in vision:  no             Up to date with eye exam:  Up to date   Memory:  Do you feel that you have a problem with memory? No  Do you often misplace items? No  Do you feel safe at home?  Yes  Cognitive Testing  Alert, Orientated? Yes  Normal Appearance? Yes  Recall of three objects?  Yes  Can perform simple calculations? Yes  Displays appropriate judgment? Yes  Can read the correct time from a watch face? Yes   Advanced Directives have been discussed with the patient? Yes -  - in place     Medications and allergies reviewed with patient and updated if appropriate.  Patient Active Problem List   Diagnosis Date Noted  . Hearing loss 10/13/2017  . Basal cell carcinoma (BCC) of skin of lower extremity including hip 05/19/2017  . Nausea & vomiting 11/07/2016  . Anxiety 10/10/2016  . Eczema 10/10/2016  . Back spasm 10/10/2016  . H/O splenectomy 10/10/2016  . H/O basal cell carcinoma excision 10/10/2016  . Dysplasia of cervix, high grade CIN 2 08/21/2016    Current Outpatient Medications on File Prior  to Visit  Medication Sig Dispense Refill  . Dermatological Products, Misc. (NEOSALUS) CREA Apply 1 application topically daily as needed. Reported on 03/06/2015 100 g 1  . halobetasol (ULTRAVATE) 0.05 % ointment APPLY TO AFFECTED AREA TWICE DAILY 50 g 0  . promethazine (PHENERGAN) 25 MG tablet Take 1 tablet (25 mg total) by mouth every 6 (six) hours as needed for nausea or vomiting. 30 tablet 0   No current facility-administered medications on file prior to visit.     Past Medical History:  Diagnosis Date  . Anemia   . Anxiety   . Basal cell carcinoma (BCC) of left upper arm 12/2015  . Basal cell carcinoma (BCC) of right lower leg 04/2016  . Blood transfusion without reported diagnosis   . Dysplasia of  cervix, low grade (CIN 1) 05/08/11  . Hyperparathyroidism, secondary ([place])    due to excess Vit D, normalized off Vit D, never treated  . Osteopenia   . Osteoporosis   . Pelvic fracture ([place])    from fall    Past Surgical History:  Procedure Laterality Date  . ANKLE SURGERY  03/14/2015   . BASAL CELL CARCINOMA EXCISION     Right arm 2017, and left leg 2018  . FRACTURE SURGERY     ankle  . MENISCUS REPAIR Right 1991  . SPLENECTOMY, TOTAL  1954   due to spherocytosis  . TUBAL LIGATION  1995    Social History   Socioeconomic History  . Marital status: Widowed    Spouse name: Not on file  . Number of children: Not on file  . Years of education: Not on file  . Highest education level: Not on file  Occupational History  . Not on file  Social Needs  . Financial resource strain: Not on file  . Food insecurity:    Worry: Not on file    Inability: Not on file  . Transportation needs:    Medical: Not on file    Non-medical: Not on file  Tobacco Use  . Smoking status: Never Smoker  . Smokeless tobacco: Never Used  Substance and Sexual Activity  . Alcohol use: Yes    Alcohol/week: 0.0 - 1.0 standard drinks    Comment: glass of wine 6 x year  . Drug use: No  . Sexual activity: Not Currently    Partners: Male    Birth control/protection: Surgical    Comment: BTL  Lifestyle  . Physical activity:    Days per week: Not on file    Minutes per session: Not on file  . Stress: Not on file  Relationships  . Social connections:    Talks on phone: Not on file    Gets together: Not on file    Attends religious service: Not on file    Active member of club or organization: Not on file    Attends meetings of clubs or organizations: Not on file    Relationship status: Not on file  Other Topics Concern  . Not on file  Social History Narrative  . Not on file    Family History  Problem Relation Age of Onset  . Heart failure Father   . Skin cancer Father   . Hypertension  Father   . Heart disease Father   . Osteoporosis Mother   . Colon cancer Mother        deceased  . Heart disease Sister     Review of Systems  Constitutional: Negative for chills and fever.  HENT: Positive for hearing loss (minimal). Negative for tinnitus.   Eyes: Negative for visual disturbance.  Respiratory: Negative for cough, shortness of breath and wheezing.   Cardiovascular: Negative for chest pain, palpitations and leg swelling.  Gastrointestinal: Negative for abdominal pain, blood in stool, constipation, diarrhea and nausea.       No gerd  Genitourinary: Negative for dysuria and hematuria.  Musculoskeletal: Negative for arthralgias.  Skin: Negative for color change and rash.  Neurological: Negative for light-headedness and headaches.  Psychiatric/Behavioral: Negative for dysphoric mood. The patient is nervous/anxious.        Objective:   Vitals:   10/13/17 0803  BP: (!) 154/92  Pulse: 64  Resp: 16  Temp: 97.7 F (36.5 C)  SpO2: 98%   Filed Weights   10/13/17 0803  Weight: 151 lb (68.5 kg)   Body mass index is 26.33 kg/m.  BP Readings from Last 3 Encounters:  10/13/17 (!) 154/92  09/21/17 130/70  11/07/16 128/82    Wt Readings from Last 3 Encounters:  10/13/17 151 lb (68.5 kg)  09/21/17 150 lb 6.4 oz (68.2 kg)  11/07/16 155 lb (70.3 kg)    Visual Acuity Screening   Right eye Left eye Both eyes  Without correction:     With correction: 20 20 20 30 20 20        Physical Exam Constitutional: She appears well-developed and well-nourished. No distress.  HENT:  Head: Normocephalic and atraumatic.  Right Ear: External ear normal. Normal ear canal and TM Left Ear: External ear normal.  Normal ear canal and TM Mouth/Throat: Oropharynx is clear and moist.  Eyes: Conjunctivae and EOM are normal.  Neck: Neck supple. No tracheal deviation present. No thyromegaly present.  No carotid bruit  Cardiovascular: Normal rate, regular rhythm and normal heart  sounds.   No murmur heard.  No edema. Pulmonary/Chest: Effort normal and breath sounds normal. No respiratory distress. She has no wheezes. She has no rales.  Breast: deferred to Gyn Abdominal: Soft. She exhibits no distension. There is no tenderness.  Lymphadenopathy: She has no cervical adenopathy.  Skin: Skin is warm and dry. She is not diaphoretic.  Psychiatric: She has a normal mood and affect. Her behavior is normal.        Assessment & Plan:    Wellness Exam: Immunizations  prevnar due, discussed shingrix, flu this fall, td up to date - she is unsure if she has had any pneumonia vaccines in the past or not - will try to obtain records and defer for now Colonoscopy   Up to date  Mammogram   Up to date  Gyn   Up to date  Dexa  Done 2016 by Gyn - Dr Sabra Heck - OP  - dexa per gyn Eye exam   Up to date  EKG   None on file - today - SR at 65 bpm, first degree AV block, RSR, ST Dep and extensive T wave abn - anterior/lateral and inferior ischemia, no prior for comparison Hearing loss   - yes, mild - will refer to audiology Memory concerns/difficulties  None - very good memory  Independent of ADLs     Fully independent Stressed the importance of regular exercise   Patient received copy of preventative screening tests/immunizations recommended for the next 5-10 years.    Physical exam: Screening blood work  ordereed Banker due, discussed shingrix, flu this fall, td up to date - deferred prevnar Colonoscopy   Up to date  Mammogram  Up to date  Gyn   Up to date  Dexa  Done 2016 by Gyn - Dr Sabra Heck - OP  - dexa per gyn Eye exams  Up to date  EKG   None on file - today - SR at 65 bpm, first degree AV block, RSR, ST Dep and extensive T wave abn - anterior/lateral and inferior ischemia, no prior for comparison Exercise walking but not regularly Weight    BMI good for age Skin  - sees derm Q 6 months Substance abuse   none  See Problem List for Assessment and Plan  of chronic medical problems.    FU in 6 months

## 2017-10-13 ENCOUNTER — Other Ambulatory Visit (INDEPENDENT_AMBULATORY_CARE_PROVIDER_SITE_OTHER): Payer: Medicare HMO

## 2017-10-13 ENCOUNTER — Telehealth: Payer: Self-pay | Admitting: Internal Medicine

## 2017-10-13 ENCOUNTER — Encounter: Payer: Self-pay | Admitting: Internal Medicine

## 2017-10-13 ENCOUNTER — Ambulatory Visit (INDEPENDENT_AMBULATORY_CARE_PROVIDER_SITE_OTHER): Payer: Medicare HMO | Admitting: Internal Medicine

## 2017-10-13 VITALS — BP 154/92 | HR 64 | Temp 97.7°F | Resp 16 | Ht 63.5 in | Wt 151.0 lb

## 2017-10-13 DIAGNOSIS — H9190 Unspecified hearing loss, unspecified ear: Secondary | ICD-10-CM | POA: Diagnosis not present

## 2017-10-13 DIAGNOSIS — R112 Nausea with vomiting, unspecified: Secondary | ICD-10-CM

## 2017-10-13 DIAGNOSIS — F419 Anxiety disorder, unspecified: Secondary | ICD-10-CM | POA: Diagnosis not present

## 2017-10-13 DIAGNOSIS — Z0000 Encounter for general adult medical examination without abnormal findings: Secondary | ICD-10-CM | POA: Diagnosis not present

## 2017-10-13 DIAGNOSIS — F411 Generalized anxiety disorder: Secondary | ICD-10-CM | POA: Insufficient documentation

## 2017-10-13 DIAGNOSIS — R69 Illness, unspecified: Secondary | ICD-10-CM | POA: Diagnosis not present

## 2017-10-13 DIAGNOSIS — C44711 Basal cell carcinoma of skin of unspecified lower limb, including hip: Secondary | ICD-10-CM | POA: Diagnosis not present

## 2017-10-13 DIAGNOSIS — R9431 Abnormal electrocardiogram [ECG] [EKG]: Secondary | ICD-10-CM

## 2017-10-13 LAB — CBC WITH DIFFERENTIAL/PLATELET
BASOS ABS: 0.1 10*3/uL (ref 0.0–0.1)
BASOS PCT: 1.5 % (ref 0.0–3.0)
EOS ABS: 0.5 10*3/uL (ref 0.0–0.7)
Eosinophils Relative: 6.8 % — ABNORMAL HIGH (ref 0.0–5.0)
HEMATOCRIT: 37.5 % (ref 36.0–46.0)
Hemoglobin: 13.5 g/dL (ref 12.0–15.0)
LYMPHS PCT: 15.6 % (ref 12.0–46.0)
Lymphs Abs: 1.2 10*3/uL (ref 0.7–4.0)
MCHC: 36.1 g/dL — ABNORMAL HIGH (ref 30.0–36.0)
MCV: 95.2 fl (ref 78.0–100.0)
Monocytes Absolute: 0.9 10*3/uL (ref 0.1–1.0)
Monocytes Relative: 12 % (ref 3.0–12.0)
Neutro Abs: 4.9 10*3/uL (ref 1.4–7.7)
Neutrophils Relative %: 64.1 % (ref 43.0–77.0)
Platelets: 303 10*3/uL (ref 150.0–400.0)
RBC: 3.94 Mil/uL (ref 3.87–5.11)
RDW: 14.8 % (ref 11.5–15.5)
WBC: 7.6 10*3/uL (ref 4.0–10.5)

## 2017-10-13 LAB — COMPREHENSIVE METABOLIC PANEL
ALBUMIN: 4.4 g/dL (ref 3.5–5.2)
ALT: 28 U/L (ref 0–35)
AST: 28 U/L (ref 0–37)
Alkaline Phosphatase: 89 U/L (ref 39–117)
BUN: 22 mg/dL (ref 6–23)
CALCIUM: 10 mg/dL (ref 8.4–10.5)
CHLORIDE: 104 meq/L (ref 96–112)
CO2: 28 mEq/L (ref 19–32)
CREATININE: 0.81 mg/dL (ref 0.40–1.20)
GFR: 75.31 mL/min (ref >60.00)
Glucose, Bld: 98 mg/dL (ref 70–99)
POTASSIUM: 4.4 meq/L (ref 3.5–5.1)
Sodium: 139 mEq/L (ref 135–145)
Total Bilirubin: 1.2 mg/dL (ref 0.2–1.2)
Total Protein: 7.6 g/dL (ref 6.0–8.3)

## 2017-10-13 LAB — LIPID PANEL
CHOLESTEROL: 196 mg/dL (ref 0–200)
HDL: 78.5 mg/dL (ref >39.00)
LDL Cholesterol: 107 mg/dL — ABNORMAL HIGH (ref 0–99)
NonHDL: 117.29
TRIGLYCERIDES: 50 mg/dL (ref 0.0–149.0)
Total CHOL/HDL Ratio: 2
VLDL: 10 mg/dL (ref 0.0–40.0)

## 2017-10-13 LAB — TSH: TSH: 1.5 u[IU]/mL (ref 0.35–4.50)

## 2017-10-13 MED ORDER — CLONAZEPAM 0.5 MG PO TABS: 0.5000 mg | ORAL_TABLET | Freq: Every day | ORAL | 0 refills | Status: DC

## 2017-10-13 NOTE — Assessment & Plan Note (Signed)
Has intermittent nausea which will flare up GERD Takes promethazine only as needed Promethazine will likely not be covered by new insurance so will try zofran

## 2017-10-13 NOTE — Assessment & Plan Note (Signed)
Controlled, stable Continue current dose of medication - clonazepam prn

## 2017-10-13 NOTE — Assessment & Plan Note (Signed)
Mild Will refer to audiology

## 2017-10-13 NOTE — Telephone Encounter (Signed)
Her EKG is abnormal.  Since we do not have anything to compare it to I am not sure if this is a normal variant or if it truly is an abnormal EKG.   It does show the possibility of heart disease so we should evaluate further.  We can either have her see a cardiologist or consider a stress test.  I do not think this is urgent since she is asymptomatic.

## 2017-10-13 NOTE — Assessment & Plan Note (Signed)
Several lesions removed in past Sees Dr Martinique Q 6 months

## 2017-10-14 NOTE — Telephone Encounter (Signed)
Stress or nervousness would not cause an abnormal EKG.   Dr Ron Parker retired - is there someone else she would like to see or can I refer to her one of the [facility] cardiologists.

## 2017-10-14 NOTE — Telephone Encounter (Signed)
Pt aware of results. States that she prefers to see Dr. Dola Argyle if possible for cardiologist. Pt also wanted to know if her BP at that moment or stress could cause an abnormal EKG. She states she was very nervous during the time of the EKG.

## 2017-10-14 NOTE — Telephone Encounter (Signed)
Pt aware of response below. Please place referral to cardiologist that you recommend.

## 2017-10-15 ENCOUNTER — Encounter: Payer: Self-pay | Admitting: Internal Medicine

## 2017-10-16 ENCOUNTER — Telehealth: Payer: Self-pay | Admitting: Obstetrics & Gynecology

## 2017-10-16 NOTE — Telephone Encounter (Signed)
Routing request to Dr. Sabra Heck as patient is asking for referral advice and provider preference from Dr. Sabra Heck.

## 2017-10-16 NOTE — Telephone Encounter (Signed)
Patient had annual at Dr. Quay Burow office this week. Patient states they performed an EKG and it was abnormal. Dr. Quay Burow has advised her to go to cardiology. Patient would like Dr. Ammie Ferrier  recommendation on which cardiologist she should see. Patient states a detailed message can be left on her voicemail.

## 2017-10-16 NOTE — Telephone Encounter (Signed)
Called and left detailed message with pt about recommendations.  Ok to close encounter.

## 2017-11-06 DIAGNOSIS — Z85828 Personal history of other malignant neoplasm of skin: Secondary | ICD-10-CM | POA: Diagnosis not present

## 2017-11-06 DIAGNOSIS — L720 Epidermal cyst: Secondary | ICD-10-CM | POA: Diagnosis not present

## 2017-11-06 DIAGNOSIS — D2271 Melanocytic nevi of right lower limb, including hip: Secondary | ICD-10-CM | POA: Diagnosis not present

## 2017-11-06 DIAGNOSIS — D0472 Carcinoma in situ of skin of left lower limb, including hip: Secondary | ICD-10-CM | POA: Diagnosis not present

## 2017-11-06 DIAGNOSIS — D485 Neoplasm of uncertain behavior of skin: Secondary | ICD-10-CM | POA: Diagnosis not present

## 2017-11-06 DIAGNOSIS — L821 Other seborrheic keratosis: Secondary | ICD-10-CM | POA: Diagnosis not present

## 2017-11-11 DIAGNOSIS — R69 Illness, unspecified: Secondary | ICD-10-CM | POA: Diagnosis not present

## 2017-11-20 ENCOUNTER — Ambulatory Visit: Payer: Medicare HMO | Admitting: Cardiovascular Disease

## 2017-12-18 DIAGNOSIS — Z85828 Personal history of other malignant neoplasm of skin: Secondary | ICD-10-CM | POA: Diagnosis not present

## 2017-12-18 DIAGNOSIS — D485 Neoplasm of uncertain behavior of skin: Secondary | ICD-10-CM | POA: Diagnosis not present

## 2017-12-18 DIAGNOSIS — L720 Epidermal cyst: Secondary | ICD-10-CM | POA: Diagnosis not present

## 2018-01-26 ENCOUNTER — Other Ambulatory Visit: Payer: Self-pay | Admitting: Obstetrics & Gynecology

## 2018-01-26 DIAGNOSIS — Z1231 Encounter for screening mammogram for malignant neoplasm of breast: Secondary | ICD-10-CM

## 2018-02-15 ENCOUNTER — Other Ambulatory Visit: Payer: Self-pay | Admitting: Internal Medicine

## 2018-02-15 DIAGNOSIS — F411 Generalized anxiety disorder: Secondary | ICD-10-CM

## 2018-02-15 NOTE — Telephone Encounter (Signed)
[place] Controlled Database Checked Last filled: 10/13/17 # 30 LOV w/PCP: 10/13/17 Next appt w/PCP: 04/26/18

## 2018-02-15 NOTE — Telephone Encounter (Signed)
Copied from [place] [id number]. Topic: Quick Communication - Rx Refill/Question >> Feb 15, 2018  1:03 PM Cecelia Byars, NT wrote: Medication:  clonazePAM (KLONOPIN) 0.5 MG tablet   Has the patient contacted their pharmacy? yes (Agent: If no, request that the patient contact the pharmacy for the refill. (Agent: If yes, when and what did the pharmacy advise? No refills left to call the practice  Preferred Pharmacy (with phone number or street name  [id number] - [place], [place]. AT [address] [phone] (Phone) [phone] (Fax)    Agent: Please be advised that RX refills may take up to 3 business days. We ask that you follow-up with your pharmacy.

## 2018-02-16 MED ORDER — CLONAZEPAM 0.5 MG PO TABS: 0.5000 mg | ORAL_TABLET | Freq: Every day | ORAL | 0 refills | Status: DC

## 2018-03-09 ENCOUNTER — Ambulatory Visit
Admission: RE | Admit: 2018-03-09 | Discharge: 2018-03-09 | Disposition: A | Discharge status: Home / Self Care | Payer: Medicare HMO | Source: Ambulatory Visit | Attending: Obstetrics & Gynecology | Admitting: Obstetrics & Gynecology

## 2018-03-09 DIAGNOSIS — Z1231 Encounter for screening mammogram for malignant neoplasm of breast: Secondary | ICD-10-CM | POA: Diagnosis not present

## 2018-04-01 DIAGNOSIS — R69 Illness, unspecified: Secondary | ICD-10-CM | POA: Diagnosis not present

## 2018-04-26 ENCOUNTER — Encounter: Payer: Self-pay | Admitting: Internal Medicine

## 2018-04-26 ENCOUNTER — Ambulatory Visit (INDEPENDENT_AMBULATORY_CARE_PROVIDER_SITE_OTHER): Payer: Medicare HMO | Admitting: Internal Medicine

## 2018-04-26 VITALS — BP 154/88 | HR 63 | Temp 98.7°F | Resp 16 | Ht 63.5 in | Wt 152.8 lb

## 2018-04-26 DIAGNOSIS — F419 Anxiety disorder, unspecified: Secondary | ICD-10-CM

## 2018-04-26 DIAGNOSIS — R9431 Abnormal electrocardiogram [ECG] [EKG]: Secondary | ICD-10-CM | POA: Insufficient documentation

## 2018-04-26 DIAGNOSIS — R69 Illness, unspecified: Secondary | ICD-10-CM | POA: Diagnosis not present

## 2018-04-26 DIAGNOSIS — R030 Elevated blood-pressure reading, without diagnosis of hypertension: Secondary | ICD-10-CM | POA: Insufficient documentation

## 2018-04-26 DIAGNOSIS — Z23 Encounter for immunization: Secondary | ICD-10-CM

## 2018-04-26 MED ORDER — CLONAZEPAM 0.5 MG PO TABS: 0.5000 mg | ORAL_TABLET | Freq: Every day | ORAL | 0 refills | Status: DC

## 2018-04-26 NOTE — Assessment & Plan Note (Signed)
Situational anxiety Takes clonazepam daily as needed and may take it several times in 1 month for may go several months without taking it-it just depends Well-tolerated, no side effects and effective We will continue

## 2018-04-26 NOTE — Assessment & Plan Note (Addendum)
Asymptomatic Family history of CAD Abn EKG - recommend further evaluation Referred to cardiology

## 2018-04-26 NOTE — Patient Instructions (Addendum)
Prevnar pneumonia immunization administered today.   Medications reviewed and updated.  Changes include :   none   A referral was ordered for Dr Meda Coffee.   Please followup in 6 months

## 2018-04-26 NOTE — Progress Notes (Signed)
Subjective:    Patient ID: Alicia Wilkerson, female    DOB: September 03, 1950, 68 y.o.   MRN: [id number]  HPI The patient is here for follow up.  Anxiety:  She is taking clonazepam as needed only for anxiety.  She takes it several times a month some months, but can also go several months w/o taking it.  The medication helps and just having it on hand helps.  It is effective and she denies side effects.    Elevated Blood pressure:  She monitors her BP at home and it is well controlled - typically in the 120's.  She is compliant with a low sodium diet.  She rides a recumbent bike.    She denies any chest pain, palpitations or SOB.    Abnormal EKG:  She was here 6 months ago for routine physical and had an EKG.  EKG was abnormal.  She was asymptomatic, but given her family history of coronary artery disease and no prior EKGs I recommended evaluation with cardiology.  She did have an appointment, but states it was canceled although in the chart it appears as a no-show.  She denies any chest pain, palpitations and shortness of breath.  She is exercising on a recumbent bike and does everything around her home.  She denies any chest pain or palpitations with those activities.  Medications and allergies reviewed with patient and updated if appropriate.  Patient Active Problem List   Diagnosis Date Noted  . Hearing loss 10/13/2017  . Basal cell carcinoma (BCC) of skin of lower extremity including hip 05/19/2017  . Nausea & vomiting 11/07/2016  . Anxiety 10/10/2016  . Eczema 10/10/2016  . Back spasm 10/10/2016  . H/O splenectomy 10/10/2016  . H/O basal cell carcinoma excision 10/10/2016  . Dysplasia of cervix, high grade CIN 2 08/21/2016    Current Outpatient Medications on File Prior to Visit  Medication Sig Dispense Refill  . clonazePAM (KLONOPIN) 0.5 MG tablet Take 1 tablet (0.5 mg total) by mouth daily. Panic attacks - as needed 10 tablet 0  . Dermatological Products, Misc. (NEOSALUS) CREA Apply  1 application topically daily as needed. Reported on 03/06/2015 100 g 1  . halobetasol (ULTRAVATE) 0.05 % ointment APPLY TO AFFECTED AREA TWICE DAILY 50 g 0  . promethazine (PHENERGAN) 25 MG tablet Take 1 tablet (25 mg total) by mouth every 6 (six) hours as needed for nausea or vomiting. 30 tablet 0   No current facility-administered medications on file prior to visit.     Past Medical History:  Diagnosis Date  . Anemia   . Anxiety   . Basal cell carcinoma (BCC) of left upper arm 12/2015  . Basal cell carcinoma (BCC) of right lower leg 04/2016  . Blood transfusion without reported diagnosis   . Dysplasia of cervix, low grade (CIN 1) 05/08/11  . Hyperparathyroidism, secondary ([place])    due to excess Vit D, normalized off Vit D, never treated  . Osteopenia   . Osteoporosis   . Pelvic fracture ([place])    from fall    Past Surgical History:  Procedure Laterality Date  . ANKLE SURGERY  03/14/2015   . BASAL CELL CARCINOMA EXCISION     Right arm 2017, and left leg 2018  . FRACTURE SURGERY     ankle  . MENISCUS REPAIR Right 1991  . SPLENECTOMY, TOTAL  1954   due to spherocytosis  . Long    Social History   Socioeconomic  History  . Marital status: Widowed    Spouse name: Not on file  . Number of children: Not on file  . Years of education: Not on file  . Highest education level: Not on file  Occupational History  . Not on file  Social Needs  . Financial resource strain: Not on file  . Food insecurity:    Worry: Not on file    Inability: Not on file  . Transportation needs:    Medical: Not on file    Non-medical: Not on file  Tobacco Use  . Smoking status: Never Smoker  . Smokeless tobacco: Never Used  Substance and Sexual Activity  . Alcohol use: Yes    Alcohol/week: 0.0 - 1.0 standard drinks    Comment: glass of wine 6 x year  . Drug use: No  . Sexual activity: Not Currently    Partners: Male    Birth control/protection: Surgical    Comment: BTL    Lifestyle  . Physical activity:    Days per week: Not on file    Minutes per session: Not on file  . Stress: Not on file  Relationships  . Social connections:    Talks on phone: Not on file    Gets together: Not on file    Attends religious service: Not on file    Active member of club or organization: Not on file    Attends meetings of clubs or organizations: Not on file    Relationship status: Not on file  Other Topics Concern  . Not on file  Social History Narrative  . Not on file    Family History  Problem Relation Age of Onset  . Heart failure Father   . Skin cancer Father   . Hypertension Father   . Heart disease Father   . Osteoporosis Mother   . Colon cancer Mother        deceased  . Heart disease Sister     Review of Systems  Constitutional: Negative for fever.  Respiratory: Negative for cough, shortness of breath and wheezing.   Cardiovascular: Negative for chest pain, palpitations and leg swelling.  Neurological: Negative for light-headedness and headaches.       Objective:   Vitals:   04/26/18 0800  BP: (!) 154/88  Pulse: 63  Resp: 16  Temp: 98.7 F (37.1 C)  SpO2: 99%   BP Readings from Last 3 Encounters:  04/26/18 (!) 154/88  10/13/17 (!) 154/92  09/21/17 130/70   Wt Readings from Last 3 Encounters:  04/26/18 152 lb 12.8 oz (69.3 kg)  10/13/17 151 lb (68.5 kg)  09/21/17 150 lb 6.4 oz (68.2 kg)   Body mass index is 26.64 kg/m.   Physical Exam    Constitutional: Appears well-developed and well-nourished. No distress.  HENT:  Head: Normocephalic and atraumatic.  Neck: Neck supple. No tracheal deviation present. No thyromegaly present.  No cervical lymphadenopathy Cardiovascular: Normal rate, regular rhythm and normal heart sounds.   No murmur heard. No carotid bruit .  No edema Pulmonary/Chest: Effort normal and breath sounds normal. No respiratory distress. No has no wheezes. No rales.  Skin: Skin is warm and dry. Not diaphoretic.   Psychiatric: Normal mood and affect. Behavior is normal.      Assessment & Plan:    See Problem List for Assessment and Plan of chronic medical problems.

## 2018-04-26 NOTE — Assessment & Plan Note (Signed)
Blood pressure elevated here, but has been well controlled at home-typically in the 403J systolically She has some degree of whitecoat hypertension She will continue to monitor blood pressure at home Continue low-sodium diet and regular exercise No need for medication

## 2018-04-27 ENCOUNTER — Telehealth: Payer: Self-pay | Admitting: *Deleted

## 2018-04-27 NOTE — Telephone Encounter (Signed)
A referral for Dr Meda Coffee was ordered on the day of her appointment - they are in the same office - she should ask them directly about seeing Dr Meda Coffee.

## 2018-04-27 NOTE — Telephone Encounter (Signed)
Copied from [place] [id number]. Topic: Referral - Request for Referral >> Apr 27, 2018  1:48 PM Yvette Rack wrote: Has patient seen PCP for this complaint? yes  *If NO, is insurance requiring patient see PCP for this issue before PCP can refer them? Referral for which specialty: Cardiology Preferred provider/office: [facility] Reason for referral: cardiology - Pt stated she received a call from [facility] office but she does not want to go there. Pt requests referral to [facility]

## 2018-04-27 NOTE — Telephone Encounter (Signed)
Gave pt response below. She will call back and request Dr. Meda Coffee.

## 2018-05-03 NOTE — Telephone Encounter (Signed)
FYI:    "04/28/18 2:29pm.... Patient called to scheduled from referral but Dr. Meda Coffee doesn't have any appoinments open patient will call back end of March for July scheduled to be open....JLK" [place] said about 1 hour ago

## 2018-05-07 DIAGNOSIS — Z85828 Personal history of other malignant neoplasm of skin: Secondary | ICD-10-CM | POA: Diagnosis not present

## 2018-05-07 DIAGNOSIS — D2271 Melanocytic nevi of right lower limb, including hip: Secondary | ICD-10-CM | POA: Diagnosis not present

## 2018-05-07 DIAGNOSIS — L821 Other seborrheic keratosis: Secondary | ICD-10-CM | POA: Diagnosis not present

## 2018-05-07 DIAGNOSIS — D1801 Hemangioma of skin and subcutaneous tissue: Secondary | ICD-10-CM | POA: Diagnosis not present

## 2018-05-07 DIAGNOSIS — L720 Epidermal cyst: Secondary | ICD-10-CM | POA: Diagnosis not present

## 2018-05-07 DIAGNOSIS — L738 Other specified follicular disorders: Secondary | ICD-10-CM | POA: Diagnosis not present

## 2018-05-10 ENCOUNTER — Other Ambulatory Visit: Payer: Self-pay | Admitting: Internal Medicine

## 2018-05-10 DIAGNOSIS — F419 Anxiety disorder, unspecified: Secondary | ICD-10-CM

## 2018-05-10 NOTE — Telephone Encounter (Signed)
Copied from [place] [id number]. Topic: Quick Communication - Rx Refill/Question >> May 10, 2018 11:12 AM Gustavus Messing wrote: Medication: clonazePAM (KLONOPIN) 0.5 MG tablet   Has the patient contacted their pharmacy? No. (Agent: If no, request that the patient contact the pharmacy for the refill.) Dr. Quay Burow told the patient to call whenever she is out of this medication   Preferred Pharmacy (with phone number or street name): [id number] [place], [place]. AT [address] [phone] (Phone) [phone] (Fax)    Agent: Please be advised that RX refills may take up to 3 business days. We ask that you follow-up with your pharmacy.

## 2018-05-11 MED ORDER — CLONAZEPAM 0.5 MG PO TABS: 0.5000 mg | ORAL_TABLET | Freq: Every day | ORAL | 0 refills | Status: DC

## 2018-05-11 NOTE — Telephone Encounter (Signed)
Last refill was 02/16/18 Last OV 04/26/18

## 2018-09-21 ENCOUNTER — Other Ambulatory Visit: Payer: Self-pay

## 2018-09-23 ENCOUNTER — Encounter: Payer: Self-pay | Admitting: Obstetrics & Gynecology

## 2018-09-23 ENCOUNTER — Other Ambulatory Visit (HOSPITAL_COMMUNITY)
Admission: RE | Admit: 2018-09-23 | Discharge: 2018-09-23 | Disposition: A | Discharge status: Home / Self Care | Payer: Medicare HMO | Source: Ambulatory Visit | Attending: Obstetrics & Gynecology | Admitting: Obstetrics & Gynecology

## 2018-09-23 ENCOUNTER — Other Ambulatory Visit: Payer: Self-pay

## 2018-09-23 ENCOUNTER — Ambulatory Visit (INDEPENDENT_AMBULATORY_CARE_PROVIDER_SITE_OTHER): Payer: Medicare HMO | Admitting: Obstetrics & Gynecology

## 2018-09-23 VITALS — BP 128/80 | HR 72 | Temp 96.8°F | Ht 63.5 in | Wt 152.0 lb

## 2018-09-23 DIAGNOSIS — N871 Moderate cervical dysplasia: Secondary | ICD-10-CM

## 2018-09-23 DIAGNOSIS — Z124 Encounter for screening for malignant neoplasm of cervix: Secondary | ICD-10-CM | POA: Insufficient documentation

## 2018-09-23 DIAGNOSIS — R87610 Atypical squamous cells of undetermined significance on cytologic smear of cervix (ASC-US): Secondary | ICD-10-CM | POA: Insufficient documentation

## 2018-09-23 DIAGNOSIS — Z780 Asymptomatic menopausal state: Secondary | ICD-10-CM | POA: Insufficient documentation

## 2018-09-23 DIAGNOSIS — R69 Illness, unspecified: Secondary | ICD-10-CM | POA: Diagnosis not present

## 2018-09-23 DIAGNOSIS — Z1151 Encounter for screening for human papillomavirus (HPV): Secondary | ICD-10-CM | POA: Insufficient documentation

## 2018-09-23 DIAGNOSIS — Z01419 Encounter for gynecological examination (general) (routine) without abnormal findings: Secondary | ICD-10-CM | POA: Diagnosis not present

## 2018-09-23 NOTE — Progress Notes (Signed)
68 y.o. G19P2003 Widowed Other or two or more races female here for annual exam.  Retired in October.  Denies vaginal bleeding.  Daughter lives in [place].  She was just diagnosed with Crohn's.  Son is [place].    Has appt with Dr. Meda Coffee, cardiology.    PCP:  Dr. Quay Burow.  Has appt in August and will do blood work then.    Patient's last menstrual period was 02/24/2002.          Sexually active: No.  The current method of family planning is post menopausal status.    Exercising: Yes.    walking, bike  Smoker:  no  Health Maintenance: Pap:  09/21/17 Neg. HR HPV:neg   08/21/16 Neg. HR HPV:neg  History of abnormal Pap:  Yes, LEEP 11/05/15 CIN II MMG:  03/09/18 BIRADS1:Neg   Colonoscopy:  05/23/13 f/u 5 years.  Pt is aware this is due.   BMD:   01/05/15 Osteoporosis  TDaP:  08/21/2016 Pneumonia vaccine(s):  done Shingrix:   No Hep C testing: 08/17/15 Neg Screening Labs: PCP   reports that she has never smoked. She has never used smokeless tobacco. She reports current alcohol use. She reports that she does not use drugs.  Past Medical History:  Diagnosis Date  . Anemia   . Anxiety   . Basal cell carcinoma (BCC) of left upper arm 12/2015  . Basal cell carcinoma (BCC) of right lower leg 04/2016  . Blood transfusion without reported diagnosis   . Dysplasia of cervix, low grade (CIN 1) 05/08/11  . Hyperparathyroidism, secondary ([place])    due to excess Vit D, normalized off Vit D, never treated  . Osteopenia   . Osteoporosis   . Pelvic fracture ([place])    from fall    Past Surgical History:  Procedure Laterality Date  . ANKLE SURGERY  03/14/2015   . BASAL CELL CARCINOMA EXCISION     Right arm 2017, and left leg 2018  . FRACTURE SURGERY     ankle  . MENISCUS REPAIR Right 1991  . SPLENECTOMY, TOTAL  1954   due to spherocytosis  . TUBAL LIGATION  1995    Current Outpatient Medications  Medication Sig Dispense Refill  . clonazePAM (KLONOPIN) 0.5 MG tablet Take 1 tablet (0.5 mg total)  by mouth daily. Panic attacks - as needed 30 tablet 0  . halobetasol (ULTRAVATE) 0.05 % ointment APPLY TO AFFECTED AREA TWICE DAILY 50 g 0  . promethazine (PHENERGAN) 25 MG tablet Take 1 tablet (25 mg total) by mouth every 6 (six) hours as needed for nausea or vomiting. 30 tablet 0  . Dermatological Products, Misc. (NEOSALUS) CREA Apply 1 application topically daily as needed. Reported on 03/06/2015 (Patient not taking: Reported on 09/23/2018) 100 g 1   No current facility-administered medications for this visit.     Family History  Problem Relation Age of Onset  . Heart failure Father   . Skin cancer Father   . Hypertension Father   . Heart disease Father   . Osteoporosis Mother   . Colon cancer Mother        deceased  . Heart disease Sister     Review of Systems  All other systems reviewed and are negative.   Exam:   BP 128/80   Pulse 72   Temp (!) 96.8 F (36 C) (Temporal)   Ht 5' 3.5" (1.613 m)   Wt 152 lb (68.9 kg)   LMP 02/24/2002   BMI 26.50  kg/m   Height: 5' 3.5" (161.3 cm)  Ht Readings from Last 3 Encounters:  09/23/18 5' 3.5" (1.613 m)  04/26/18 5' 3.5" (1.613 m)  10/13/17 5' 3.5" (1.613 m)    General appearance: alert, cooperative and appears stated age Head: Normocephalic, without obvious abnormality, atraumatic Neck: no adenopathy, supple, symmetrical, trachea midline and thyroid normal to inspection and palpation Lungs: clear to auscultation bilaterally Breasts: normal appearance, no masses or tenderness Heart: regular rate and rhythm Abdomen: soft, non-tender; bowel sounds normal; no masses,  no organomegaly Extremities: extremities normal, atraumatic, no cyanosis or edema Skin: Skin color, texture, turgor normal. No rashes or lesions Lymph nodes: Cervical, supraclavicular, and axillary nodes normal. No abnormal inguinal nodes palpated Neurologic: Grossly normal  Pelvic: External genitalia:  no lesions              Urethra:  normal appearing urethra  with no masses, tenderness or lesions              Bartholins and Skenes: normal                 Vagina: normal appearing vagina with normal color and discharge, no lesions              Cervix: no lesions              Pap taken: Yes.   Bimanual Exam:  Uterus:  normal size, contour, position, consistency, mobility, non-tender              Adnexa: normal adnexa and no mass, fullness, tenderness               Rectovaginal: Confirms               Anus:  normal sphincter tone, no lesions  Chaperone was present for exam.  A:  Well Woman with normal exam PMP, no HRT H/O CIN 2,  S/p LEEP 6/17 Osteoporosis.  Stable over >10 years.  P:   Mammogram guidelines reviewed pap smear (and no HR HPV obtained today).  She is anxious Aware colonoscopy is due Has appt with Dr. Quay Burow next month.  Will do blood work next month. Return annually or prn

## 2018-09-30 LAB — CYTOLOGY - PAP
Diagnosis: UNDETERMINED — AB
HPV 16/18/45 genotyping: NEGATIVE
HPV: DETECTED — AB

## 2018-10-06 ENCOUNTER — Telehealth: Payer: Self-pay | Admitting: *Deleted

## 2018-10-06 DIAGNOSIS — R87810 Cervical high risk human papillomavirus (HPV) DNA test positive: Secondary | ICD-10-CM

## 2018-10-06 DIAGNOSIS — R87610 Atypical squamous cells of undetermined significance on cytologic smear of cervix (ASC-US): Secondary | ICD-10-CM

## 2018-10-06 NOTE — Telephone Encounter (Signed)
Notes recorded by Burnice Logan, RN on 10/06/2018 at 8:50 AM EDT  Left message to call Sharee Pimple, RN at [facility].

## 2018-10-06 NOTE — Telephone Encounter (Signed)
Patient returning call.

## 2018-10-06 NOTE — Telephone Encounter (Signed)
-----   Message from Megan Salon, MD sent at 10/01/2018  6:14 PM EDT ----- Please let pt know her pap was abnormal again this year showing ASCUS and her HR HPV was positive.  Needs colposcopy.

## 2018-10-06 NOTE — Telephone Encounter (Signed)
Spoke with patient, advised as seen below per Dr. Sabra Heck. Patient is familiar with colpo procedure. Postmenopausal. Patient request to schedule on a Monday morning. Colpo scheduled for 8/24 at 9am with Dr. Sabra Heck. Order placed for precert.   Routing to provider for final review. Patient is agreeable to disposition. Will close encounter.  Cc: Lerry Liner, Magdalene Patricia

## 2018-10-08 ENCOUNTER — Telehealth: Payer: Self-pay | Admitting: Obstetrics & Gynecology

## 2018-10-08 NOTE — Telephone Encounter (Signed)
Call placed to convey benefits for colposcopy. Spoke with patient she understands/agreeable with the benefits. Appointment scheduled 10/18/18.

## 2018-10-15 ENCOUNTER — Other Ambulatory Visit: Payer: Self-pay

## 2018-10-18 ENCOUNTER — Ambulatory Visit: Payer: Medicare HMO | Admitting: Obstetrics & Gynecology

## 2018-10-18 ENCOUNTER — Encounter: Payer: Self-pay | Admitting: Obstetrics & Gynecology

## 2018-10-18 ENCOUNTER — Other Ambulatory Visit: Payer: Self-pay

## 2018-10-18 DIAGNOSIS — N871 Moderate cervical dysplasia: Secondary | ICD-10-CM | POA: Diagnosis not present

## 2018-10-18 DIAGNOSIS — R87810 Cervical high risk human papillomavirus (HPV) DNA test positive: Secondary | ICD-10-CM | POA: Diagnosis not present

## 2018-10-18 DIAGNOSIS — R87610 Atypical squamous cells of undetermined significance on cytologic smear of cervix (ASC-US): Secondary | ICD-10-CM | POA: Diagnosis not present

## 2018-10-18 DIAGNOSIS — R69 Illness, unspecified: Secondary | ICD-10-CM | POA: Diagnosis not present

## 2018-10-18 NOTE — Patient Instructions (Signed)

## 2018-10-18 NOTE — Progress Notes (Signed)
68 y.o. Widowed Not Hispanic or Latino female here for colposcopy with possible biopsies and/or ECC due to ASCUS pap with +HR HPV obtained 09/22/2017.  Pt has had prior abnormal pap smears but had a normal pap with neg HR HPV in 2018 and 2019 .    Patient's last menstrual period was 02/24/2002.          Sexually active: No.  The current method of family planning is post menopausal status.     Patient has been counseled about results and procedure.  Risks and benefits have bene reviewed including immediate and/or delayed bleeding, infection, cervical scaring from procedure, possibility of needing additional follow up as well as treatment.  rare risks of missing a lesion discussed as well.  All questions answered.  Pt ready to proceed.  BP 130/90   Pulse 72   Temp (!) 97 F (36.1 C) (Temporal)   Ht 5' 3.5" (1.613 m)   Wt 152 lb 3.2 oz (69 kg)   LMP 02/24/2002   BMI 26.54 kg/m   Physical Exam  Constitutional: She is oriented to person, place, and time. She appears well-developed and well-nourished.  Genitourinary:    Vagina normal.  There is no rash, tenderness, lesion or injury on the right labia. There is no rash, tenderness, lesion or injury on the left labia.     Lymphadenopathy:       Right: No inguinal adenopathy present.       Left: No inguinal adenopathy present.  Neurological: She is alert and oriented to person, place, and time.  Skin: Skin is warm and dry.  Psychiatric: She has a normal mood and affect.    Speculum placed.  3% acetic acid applied to cervix for >45 seconds.  Cervix visualized with both 7.5X and 15X magnification.  Green filter also used.  Lugols solution was used.  Findings:  No AWE or abnormal staining with Lugols solutions.  TZ is fully visualized by maniuplation of the cervix.  ECC:  was performed.  Monsel's was not needed.  Excellent hemostasis was present.  Pt tolerated procedure well and all instruments were removed.  Findings noted above on picture of  cervix.  Assessment:  ASCUS pap with positive HR HPV  Plan:  Pathology results will be called to patient and follow-up planned pending results.

## 2018-10-19 DIAGNOSIS — R739 Hyperglycemia, unspecified: Secondary | ICD-10-CM | POA: Insufficient documentation

## 2018-10-19 NOTE — Progress Notes (Signed)
Subjective:    Patient ID: Alicia Wilkerson, female    DOB: [birth year], 68 y.o.   MRN: [id number]  HPI She is here for a physical exam.   She is having increased anxiety.  She worries about her kids.  1 of her daughters had just been diagnosed with Crohn's disease after having bleeding for a few months.  She was very anxious throughout this, but states she is always worried about 1 of her kids.  She takes the clonazepam as needed and that does help.  She is been using her pill sparingly because she did not think that she could get a refill before coming back to see me.  Typically if she takes this more liberally it does help.  She still does not take it on a daily basis.    Medications and allergies reviewed with patient and updated if appropriate.  Patient Active Problem List   Diagnosis Date Noted  . Hyperglycemia 10/19/2018  . Abnormal EKG 04/26/2018  . Hearing loss 10/13/2017  . Basal cell carcinoma (BCC) of skin of lower extremity including hip 05/19/2017  . Nausea & vomiting 11/07/2016  . Anxiety 10/10/2016  . Eczema 10/10/2016  . Back spasm 10/10/2016  . H/O splenectomy 10/10/2016  . H/O basal cell carcinoma excision 10/10/2016  . Dysplasia of cervix, high grade CIN 2 08/21/2016    Current Outpatient Medications on File Prior to Visit  Medication Sig Dispense Refill  . Dermatological Products, Misc. (NEOSALUS) CREA Apply 1 application topically daily as needed. Reported on 03/06/2015 100 g 1  . halobetasol (ULTRAVATE) 0.05 % ointment APPLY TO AFFECTED AREA TWICE DAILY 50 g 0   No current facility-administered medications on file prior to visit.     Past Medical History:  Diagnosis Date  . Anemia   . Anxiety   . Basal cell carcinoma (BCC) of left upper arm 12/2015  . Basal cell carcinoma (BCC) of right lower leg 04/2016  . Blood transfusion without reported diagnosis   . Dysplasia of cervix, low grade (CIN 1) 05/08/11  . Hyperparathyroidism, secondary ([place])    due  to excess Vit D, normalized off Vit D, never treated  . Osteopenia   . Osteoporosis   . Pelvic fracture ([place])    from fall    Past Surgical History:  Procedure Laterality Date  . ANKLE SURGERY  03/14/2015   . BASAL CELL CARCINOMA EXCISION     Right arm 2017, and left leg 2018  . FRACTURE SURGERY     ankle  . MENISCUS REPAIR Right 1991  . SPLENECTOMY, TOTAL  1954   due to spherocytosis  . TUBAL LIGATION  1995    Social History   Socioeconomic History  . Marital status: Widowed    Spouse name: Not on file  . Number of children: Not on file  . Years of education: Not on file  . Highest education level: Not on file  Occupational History  . Not on file  Social Needs  . Financial resource strain: Not on file  . Food insecurity    Worry: Not on file    Inability: Not on file  . Transportation needs    Medical: Not on file    Non-medical: Not on file  Tobacco Use  . Smoking status: Never Smoker  . Smokeless tobacco: Never Used  Substance and Sexual Activity  . Alcohol use: Yes    Alcohol/week: 0.0 - 1.0 standard drinks  . Drug use: No  .  Sexual activity: Not Currently    Partners: Male    Birth control/protection: Surgical    Comment: BTL  Lifestyle  . Physical activity    Days per week: Not on file    Minutes per session: Not on file  . Stress: Not on file  Relationships  . Social Herbalist on phone: Not on file    Gets together: Not on file    Attends religious service: Not on file    Active member of club or organization: Not on file    Attends meetings of clubs or organizations: Not on file    Relationship status: Not on file  Other Topics Concern  . Not on file  Social History Narrative  . Not on file    Family History  Problem Relation Age of Onset  . Heart failure Father   . Skin cancer Father   . Hypertension Father   . Heart disease Father   . Osteoporosis Mother   . Colon cancer Mother        deceased  . Heart disease Sister    . Crohn's disease Daughter     Review of Systems  Constitutional: Negative for chills and fever.  Eyes: Negative for visual disturbance.  Respiratory: Negative for cough, shortness of breath and wheezing.   Cardiovascular: Negative for chest pain, palpitations and leg swelling.  Gastrointestinal: Negative for abdominal pain, blood in stool, constipation, diarrhea and nausea.       No gerd  Genitourinary: Negative for dysuria and hematuria.  Musculoskeletal: Negative for arthralgias and back pain.  Skin: Negative for color change and rash.  Neurological: Negative for dizziness, light-headedness and headaches.  Psychiatric/Behavioral: Negative for dysphoric mood (not happy, but not depressed.  functions w/o difficulty). The patient is nervous/anxious.        Objective:   Vitals:   10/20/18 1402  BP: 126/84  Pulse: 77  Resp: 16  Temp: 98.1 F (36.7 C)  SpO2: 98%   Filed Weights   10/20/18 1402  Weight: 153 lb 12.8 oz (69.8 kg)   Body mass index is 26.82 kg/m.  BP Readings from Last 3 Encounters:  10/20/18 126/84  10/18/18 130/90  09/23/18 128/80    Wt Readings from Last 3 Encounters:  10/20/18 153 lb 12.8 oz (69.8 kg)  10/18/18 152 lb 3.2 oz (69 kg)  09/23/18 152 lb (68.9 kg)     Physical Exam Constitutional: She appears well-developed and well-nourished. No distress.  HENT:  Head: Normocephalic and atraumatic.  Right Ear: External ear normal. Normal ear canal and TM Left Ear: External ear normal.  Normal ear canal and TM Mouth/Throat: Oropharynx is clear and moist.  Eyes: Conjunctivae and EOM are normal.  Neck: Neck supple. No tracheal deviation present. No thyromegaly present.  No carotid bruit  Cardiovascular: Normal rate, regular rhythm and normal heart sounds.  No murmur heard.  No edema. Pulmonary/Chest: Effort normal and breath sounds normal. No respiratory distress. She has no wheezes. She has no rales.  Breast: deferred   Abdominal: Soft. She  exhibits no distension. There is no tenderness.  Lymphadenopathy: She has no cervical adenopathy.  Skin: Skin is warm and dry. She is not diaphoretic.  Psychiatric: She has a normal mood and affect. Her behavior is normal.        Assessment & Plan:   Physical exam: Screening blood work ordered Immunizations   Flu vaccine today, discussed shingrix Colonoscopy   Up to date  Mammogram  Up to date  Gyn      Up to date Dexa   Due - deferred - does not want to do anything for her bones to does not see the value in having a DEXA Eye exams  Due - will schedule at a later date-after COVID Exercise   regular for balance, ankles, etc. Weight  BMI good for age Skin   Sees derm  2/ year Substance abuse    none  See Problem List for Assessment and Plan of chronic medical problems.   Follow-up in 1 year

## 2018-10-19 NOTE — Patient Instructions (Addendum)
Tests ordered today. Your results will be released to MyChart (or called to you) after review.  If any changes need to be made, you will be notified at that same time.  All other Health Maintenance issues reviewed.   All recommended immunizations and age-appropriate screenings are up-to-date or discussed.  Flu immunization administered today.    Medications reviewed and updated.  Changes include :   none  Your prescription(s) have been submitted to your pharmacy. Please take as directed and contact our office if you believe you are having problem(s) with the medication(s).    Please followup in 1 year    Health Maintenance, Female Adopting a healthy lifestyle and getting preventive care are important in promoting health and wellness. Ask your health care provider about:  The right schedule for you to have regular tests and exams.  Things you can do on your own to prevent diseases and keep yourself healthy. What should I know about diet, weight, and exercise? Eat a healthy diet   Eat a diet that includes plenty of vegetables, fruits, low-fat dairy products, and lean protein.  Do not eat a lot of foods that are high in solid fats, added sugars, or sodium. Maintain a healthy weight Body mass index (BMI) is used to identify weight problems. It estimates body fat based on height and weight. Your health care provider can help determine your BMI and help you achieve or maintain a healthy weight. Get regular exercise Get regular exercise. This is one of the most important things you can do for your health. Most adults should:  Exercise for at least 150 minutes each week. The exercise should increase your heart rate and make you sweat (moderate-intensity exercise).  Do strengthening exercises at least twice a week. This is in addition to the moderate-intensity exercise.  Spend less time sitting. Even light physical activity can be beneficial. Watch cholesterol and blood lipids Have  your blood tested for lipids and cholesterol at 66 years of age, then have this test every 5 years. Have your cholesterol levels checked more often if:  Your lipid or cholesterol levels are high.  You are older than 66 years of age.  You are at high risk for heart disease. What should I know about cancer screening? Depending on your health history and family history, you may need to have cancer screening at various ages. This may include screening for:  Breast cancer.  Cervical cancer.  Colorectal cancer.  Skin cancer.  Lung cancer. What should I know about heart disease, diabetes, and high blood pressure? Blood pressure and heart disease  High blood pressure causes heart disease and increases the risk of stroke. This is more likely to develop in people who have high blood pressure readings, are of African descent, or are overweight.  Have your blood pressure checked: ? Every 3-5 years if you are 18-39 years of age. ? Every year if you are 40 years old or older. Diabetes Have regular diabetes screenings. This checks your fasting blood sugar level. Have the screening done:  Once every three years after age 40 if you are at a normal weight and have a low risk for diabetes.  More often and at a younger age if you are overweight or have a high risk for diabetes. What should I know about preventing infection? Hepatitis B If you have a higher risk for hepatitis B, you should be screened for this virus. Talk with your health care provider to find out if you are   at risk for hepatitis B infection. Hepatitis C Testing is recommended for:  Everyone born from 1945 through 1965.  Anyone with known risk factors for hepatitis C. Sexually transmitted infections (STIs)  Get screened for STIs, including gonorrhea and chlamydia, if: ? You are sexually active and are younger than 66 years of age. ? You are older than 66 years of age and your health care provider tells you that you are at  risk for this type of infection. ? Your sexual activity has changed since you were last screened, and you are at increased risk for chlamydia or gonorrhea. Ask your health care provider if you are at risk.  Ask your health care provider about whether you are at high risk for HIV. Your health care provider may recommend a prescription medicine to help prevent HIV infection. If you choose to take medicine to prevent HIV, you should first get tested for HIV. You should then be tested every 3 months for as long as you are taking the medicine. Pregnancy  If you are about to stop having your period (premenopausal) and you may become pregnant, seek counseling before you get pregnant.  Take 400 to 800 micrograms (mcg) of folic acid every day if you become pregnant.  Ask for birth control (contraception) if you want to prevent pregnancy. Osteoporosis and menopause Osteoporosis is a disease in which the bones lose minerals and strength with aging. This can result in bone fractures. If you are 65 years old or older, or if you are at risk for osteoporosis and fractures, ask your health care provider if you should:  Be screened for bone loss.  Take a calcium or vitamin D supplement to lower your risk of fractures.  Be given hormone replacement therapy (HRT) to treat symptoms of menopause. Follow these instructions at home: Lifestyle  Do not use any products that contain nicotine or tobacco, such as cigarettes, e-cigarettes, and chewing tobacco. If you need help quitting, ask your health care provider.  Do not use street drugs.  Do not share needles.  Ask your health care provider for help if you need support or information about quitting drugs. Alcohol use  Do not drink alcohol if: ? Your health care provider tells you not to drink. ? You are pregnant, may be pregnant, or are planning to become pregnant.  If you drink alcohol: ? Limit how much you use to 0-1 drink a day. ? Limit intake if you  are breastfeeding.  Be aware of how much alcohol is in your drink. In the [place] one drink equals one 12 oz bottle of beer (355 mL), one 5 oz glass of wine (148 mL), or one 1 oz glass of hard liquor (44 mL). General instructions  Schedule regular health, dental, and eye exams.  Stay current with your vaccines.  Tell your health care provider if: ? You often feel depressed. ? You have ever been abused or do not feel safe at home. Summary  Adopting a healthy lifestyle and getting preventive care are important in promoting health and wellness.  Follow your health care provider's instructions about healthy diet, exercising, and getting tested or screened for diseases.  Follow your health care provider's instructions on monitoring your cholesterol and blood pressure. This information is not intended to replace advice given to you by your health care provider. Make sure you discuss any questions you have with your health care provider. Document Released: 08/26/2010 Document Revised: 02/03/2018 Document Reviewed: 02/03/2018 Elsevier Patient Education  2020   [employer].

## 2018-10-20 ENCOUNTER — Encounter: Payer: Self-pay | Admitting: Internal Medicine

## 2018-10-20 ENCOUNTER — Other Ambulatory Visit (INDEPENDENT_AMBULATORY_CARE_PROVIDER_SITE_OTHER): Payer: Medicare HMO

## 2018-10-20 ENCOUNTER — Other Ambulatory Visit: Payer: Self-pay

## 2018-10-20 ENCOUNTER — Ambulatory Visit (INDEPENDENT_AMBULATORY_CARE_PROVIDER_SITE_OTHER): Payer: Medicare HMO | Admitting: Internal Medicine

## 2018-10-20 VITALS — BP 126/84 | HR 77 | Temp 98.1°F | Resp 16 | Ht 63.5 in | Wt 153.8 lb

## 2018-10-20 DIAGNOSIS — Z0000 Encounter for general adult medical examination without abnormal findings: Secondary | ICD-10-CM | POA: Diagnosis not present

## 2018-10-20 DIAGNOSIS — R69 Illness, unspecified: Secondary | ICD-10-CM | POA: Diagnosis not present

## 2018-10-20 DIAGNOSIS — Z23 Encounter for immunization: Secondary | ICD-10-CM | POA: Diagnosis not present

## 2018-10-20 DIAGNOSIS — R739 Hyperglycemia, unspecified: Secondary | ICD-10-CM | POA: Diagnosis not present

## 2018-10-20 DIAGNOSIS — F419 Anxiety disorder, unspecified: Secondary | ICD-10-CM | POA: Diagnosis not present

## 2018-10-20 DIAGNOSIS — C44711 Basal cell carcinoma of skin of unspecified lower limb, including hip: Secondary | ICD-10-CM

## 2018-10-20 DIAGNOSIS — R030 Elevated blood-pressure reading, without diagnosis of hypertension: Secondary | ICD-10-CM | POA: Diagnosis not present

## 2018-10-20 LAB — CBC WITH DIFFERENTIAL/PLATELET
Basophils Absolute: 0.3 10*3/uL — ABNORMAL HIGH (ref 0.0–0.1)
Basophils Relative: 2.7 % (ref 0.0–3.0)
Eosinophils Absolute: 0.7 10*3/uL (ref 0.0–0.7)
Eosinophils Relative: 7.3 % — ABNORMAL HIGH (ref 0.0–5.0)
HCT: 38.4 % (ref 36.0–46.0)
Hemoglobin: 13.7 g/dL (ref 12.0–15.0)
Lymphocytes Relative: 17.7 % (ref 12.0–46.0)
Lymphs Abs: 1.7 10*3/uL (ref 0.7–4.0)
MCHC: 35.6 g/dL (ref 30.0–36.0)
MCV: 96.2 fl (ref 78.0–100.0)
Monocytes Absolute: 0.9 10*3/uL (ref 0.1–1.0)
Monocytes Relative: 9.1 % (ref 3.0–12.0)
Neutro Abs: 6 10*3/uL (ref 1.4–7.7)
Neutrophils Relative %: 63.2 % (ref 43.0–77.0)
Platelets: 318 10*3/uL (ref 150.0–400.0)
RBC: 4 Mil/uL (ref 3.87–5.11)
RDW: 15.4 % (ref 11.5–15.5)
WBC: 9.4 10*3/uL (ref 4.0–10.5)

## 2018-10-20 LAB — COMPREHENSIVE METABOLIC PANEL
ALT: 27 U/L (ref 0–35)
AST: 28 U/L (ref 0–37)
Albumin: 4.5 g/dL (ref 3.5–5.2)
Alkaline Phosphatase: 95 U/L (ref 39–117)
BUN: 10 mg/dL (ref 6–23)
CO2: 27 mEq/L (ref 19–32)
Calcium: 9.8 mg/dL (ref 8.4–10.5)
Chloride: 104 mEq/L (ref 96–112)
Creatinine, Ser: 0.7 mg/dL (ref 0.40–1.20)
GFR: 83.59 mL/min (ref >60.00)
Glucose, Bld: 90 mg/dL (ref 70–99)
Potassium: 4.3 mEq/L (ref 3.5–5.1)
Sodium: 141 mEq/L (ref 135–145)
Total Bilirubin: 1.1 mg/dL (ref 0.2–1.2)
Total Protein: 7.7 g/dL (ref 6.0–8.3)

## 2018-10-20 LAB — LIPID PANEL
Cholesterol: 198 mg/dL (ref 0–200)
HDL: 71.9 mg/dL (ref >39.00)
LDL Cholesterol: 113 mg/dL — ABNORMAL HIGH (ref 0–99)
NonHDL: 126.26
Total CHOL/HDL Ratio: 3
Triglycerides: 66 mg/dL (ref 0.0–149.0)
VLDL: 13.2 mg/dL (ref 0.0–40.0)

## 2018-10-20 LAB — TSH: TSH: 1.13 u[IU]/mL (ref 0.35–4.50)

## 2018-10-20 LAB — HEMOGLOBIN A1C: Hgb A1c MFr Bld: 3.9 % — ABNORMAL LOW (ref 4.6–6.5)

## 2018-10-20 MED ORDER — CLONAZEPAM 0.5 MG PO TABS: 0.5000 mg | ORAL_TABLET | Freq: Every day | ORAL | 2 refills | Status: DC | PRN

## 2018-10-20 NOTE — Assessment & Plan Note (Addendum)
Having increased anxiety Takes clonazepam as needed, but she was under the impression she had to have 30 pills last 6 months, which was not the intention. She can ask for refills She will continue to take as needed-she typically will not take on a daily basis [place] controlled substance database checked.  Will refill and use as needed Discussed that I can refer to her therapist or we can consider a daily SSRI if needed

## 2018-10-20 NOTE — Assessment & Plan Note (Signed)
Sees dermatology twice a year

## 2018-10-20 NOTE — Assessment & Plan Note (Signed)
a1c

## 2018-11-08 ENCOUNTER — Telehealth: Payer: Self-pay | Admitting: *Deleted

## 2018-11-08 DIAGNOSIS — Z85828 Personal history of other malignant neoplasm of skin: Secondary | ICD-10-CM | POA: Diagnosis not present

## 2018-11-08 DIAGNOSIS — L738 Other specified follicular disorders: Secondary | ICD-10-CM | POA: Diagnosis not present

## 2018-11-08 DIAGNOSIS — C44712 Basal cell carcinoma of skin of right lower limb, including hip: Secondary | ICD-10-CM | POA: Diagnosis not present

## 2018-11-08 DIAGNOSIS — L821 Other seborrheic keratosis: Secondary | ICD-10-CM | POA: Diagnosis not present

## 2018-11-08 DIAGNOSIS — D2271 Melanocytic nevi of right lower limb, including hip: Secondary | ICD-10-CM | POA: Diagnosis not present

## 2018-11-08 DIAGNOSIS — D1801 Hemangioma of skin and subcutaneous tissue: Secondary | ICD-10-CM | POA: Diagnosis not present

## 2018-11-08 DIAGNOSIS — C44619 Basal cell carcinoma of skin of left upper limb, including shoulder: Secondary | ICD-10-CM | POA: Diagnosis not present

## 2018-11-08 DIAGNOSIS — D485 Neoplasm of uncertain behavior of skin: Secondary | ICD-10-CM | POA: Diagnosis not present

## 2018-11-08 NOTE — Telephone Encounter (Signed)
Patient returning call to Dr. Sabra Heck regarding colpo results. Patient states she will be available anytime. Advised I will forward to Dr. Sabra Heck to review. Advised she is seeing patients, call will likely be returned late afternoon. Patient agreeable.

## 2018-11-09 ENCOUNTER — Telehealth: Payer: Self-pay | Admitting: Obstetrics & Gynecology

## 2018-11-09 ENCOUNTER — Other Ambulatory Visit: Payer: Self-pay

## 2018-11-09 ENCOUNTER — Other Ambulatory Visit: Payer: Self-pay | Admitting: *Deleted

## 2018-11-09 DIAGNOSIS — N871 Moderate cervical dysplasia: Secondary | ICD-10-CM

## 2018-11-09 NOTE — Telephone Encounter (Signed)
Confirmed LEEP scheduled in Epic.   Encounter closed.

## 2018-11-09 NOTE — Progress Notes (Signed)
LEEP

## 2018-11-09 NOTE — Telephone Encounter (Signed)
Call placed to convey benefits for LEEP COLPO.

## 2018-11-09 NOTE — Telephone Encounter (Signed)
Called pt personally 11/08/2018.  She is going to have LEEP done on Thursday at 2:30pm.  I think she is already scheduled.  Thanks.

## 2018-11-09 NOTE — Telephone Encounter (Signed)
Patient returned my call and I conveyed the benefits. Patient understands/agreeable with the benefits. Patient aware of the cancellation policy. Appointment scheduled 11/11/18.

## 2018-11-11 ENCOUNTER — Encounter: Payer: Self-pay | Admitting: Obstetrics & Gynecology

## 2018-11-11 ENCOUNTER — Other Ambulatory Visit: Payer: Self-pay

## 2018-11-11 ENCOUNTER — Ambulatory Visit: Payer: Medicare HMO | Admitting: Obstetrics & Gynecology

## 2018-11-11 VITALS — BP 156/86 | HR 72 | Temp 97.3°F | Ht 63.5 in | Wt 151.0 lb

## 2018-11-11 DIAGNOSIS — R87810 Cervical high risk human papillomavirus (HPV) DNA test positive: Secondary | ICD-10-CM

## 2018-11-11 DIAGNOSIS — R87610 Atypical squamous cells of undetermined significance on cytologic smear of cervix (ASC-US): Secondary | ICD-10-CM

## 2018-11-11 DIAGNOSIS — R69 Illness, unspecified: Secondary | ICD-10-CM | POA: Diagnosis not present

## 2018-11-11 DIAGNOSIS — N871 Moderate cervical dysplasia: Secondary | ICD-10-CM | POA: Diagnosis not present

## 2018-11-11 NOTE — Progress Notes (Signed)
68 y.o. G2P2 Widowed DWF here for LEEP due to CIN 2/3 noted on ECC which was performed at her most recent colposcopy performed 10/18/2018.  Pap obtained before colposcopy showed ASCUS findings with +HR HPV.   Patient's last menstrual period was 02/24/2002.          Sexually active: No.  The current method of family planning is post menopausal status.     Pre-procedure vitals: Blood pressure (!) 156/86, pulse 72, temperature (!) 97.3 F (36.3 C), temperature source Temporal, height 5' 3.5" (1.613 m), weight 151 lb (68.5 kg), last menstrual period 02/24/2002.   Procedure explained and patient's questions were invited and answered.   Consent form signed.  Procedure:  Speculum placed with good visualization of the cervix.  Colposcopy performed showing:  no visible lesions.  Cervix is small due to prior LEEP and with the smallest Loop available in the office, I feel there is risk for lateral spread and possible lateral injury.  Feel conization in the OR would be best for this situation and would give the best opportunity for full treatment with LEEP which is the pt's desire.    We did spend some time discussing hysterectomy as well given this will be the second conization and she still has peristent high risk HPV.  Pt is not sure she wants to consider this right now.  Procedure for conization and ECC above conization discussed.  Risks and benefits reviewed.  Risk of bleeding, infection, surrounding tissue injury, and need for subsequent procedures all discussed.  Pt comfortable with plan and ready to proceed.   Assessment: CIN 2/3 on ECC only H/O prior  LEEP +HR HPV  Plan: Proceed with scheduling conization in OR with ECC.  ~20 minutes spent with patient >50% of time was in face to face discussion of above.

## 2018-11-15 ENCOUNTER — Encounter: Payer: Self-pay | Admitting: Cardiology

## 2018-11-15 ENCOUNTER — Ambulatory Visit: Payer: Medicare HMO | Admitting: Cardiology

## 2018-11-15 ENCOUNTER — Other Ambulatory Visit: Payer: Self-pay

## 2018-11-15 VITALS — BP 156/86 | HR 81 | Ht 63.5 in | Wt 151.2 lb

## 2018-11-15 DIAGNOSIS — Z8249 Family history of ischemic heart disease and other diseases of the circulatory system: Secondary | ICD-10-CM | POA: Diagnosis not present

## 2018-11-15 DIAGNOSIS — R0609 Other forms of dyspnea: Secondary | ICD-10-CM | POA: Diagnosis not present

## 2018-11-15 DIAGNOSIS — R0789 Other chest pain: Secondary | ICD-10-CM

## 2018-11-15 DIAGNOSIS — R072 Precordial pain: Secondary | ICD-10-CM | POA: Diagnosis not present

## 2018-11-15 DIAGNOSIS — E785 Hyperlipidemia, unspecified: Secondary | ICD-10-CM

## 2018-11-15 DIAGNOSIS — R079 Chest pain, unspecified: Secondary | ICD-10-CM

## 2018-11-15 DIAGNOSIS — R0600 Dyspnea, unspecified: Secondary | ICD-10-CM

## 2018-11-15 MED ORDER — METOPROLOL TARTRATE 100 MG PO TABS: 100.0000 mg | ORAL_TABLET | Freq: Once | ORAL | 0 refills | Status: DC

## 2018-11-15 NOTE — Patient Instructions (Signed)
Medication Instructions:   Your physician recommends that you continue on your current medications as directed. Please refer to the Current Medication list given to you today.  If you need a refill on your cardiac medications before your next appointment, please call your pharmacy.    Lab work:  [place] [employer] TO YOUR CORONARY CT PER [place] BMET  If you have labs (blood work) drawn today and your tests are completely normal, you will receive your results only by: Marland Kitchen MyChart Message (if you have MyChart) OR . A paper copy in the mail If you have any lab test that is abnormal or we need to change your treatment, we will call you to review the results.    Testing/Procedures:  Your physician has requested that you have an echocardiogram. Echocardiography is a painless test that uses sound waves to create images of your heart. It provides your doctor with information about the size and shape of your heart and how well your heart's chambers and valves are working. This procedure takes approximately one hour. There are no restrictions for this procedure.   CORONARY CT Your cardiac CT will be scheduled at one of the below locations:   [facility] [address] [place], [place] [address] [phone]  If scheduled at [facility], please arrive at the [facility] main entrance of [facility] 30-45 minutes prior to test start time. Proceed to the [facility] Radiology Department (first floor) to check-in and test prep.   Please follow these instructions carefully (unless otherwise directed):  On the Night Before the Test: . Be sure to Drink plenty of water. . Do not consume any caffeinated/decaffeinated beverages or chocolate 12 hours prior to your test. . Do not take any antihistamines 12 hours prior to your test.   On the Day of the Test: . Drink plenty of water. Do not drink any water within one hour of the test. . Do not eat any  food 4 hours prior to the test. . You may take your regular medications prior to the test.  . Take metoprolol 100 MG  (Lopressor) two hours prior to test. . FEMALES- please wear underwire-free bra if available        After the Test: . Drink plenty of water. . After receiving IV contrast, you may experience a mild flushed feeling. This is normal. . On occasion, you may experience a mild rash up to 24 hours after the test. This is not dangerous. If this occurs, you can take Benadryl 25 mg and increase your fluid intake. . If you experience trouble breathing, this can be serious. If it is severe call 911 IMMEDIATELY. If it is mild, please call our office.   Please contact the cardiac imaging nurse navigator should you have any questions/concerns Marchia Bond, RN Navigator Cardiac Imaging [facility] [phone] Office  [phone] Cell     Follow-Up:  2 MONTHS WITH AN EXTENDER IN OUR OFFICE

## 2018-11-15 NOTE — Progress Notes (Signed)
Cardiology Office Note:    Date:  11/15/2018   ID:  Alicia Wilkerson, DOB 1950/10/21, MRN [id number]  PCP:  Binnie Rail, MD  Cardiologist:  No primary care provider on file.  Electrophysiologist:  None   Referring MD: Binnie Rail, MD   Chief complain: DOE, anxiety  History of Present Illness:    Alicia Wilkerson is a 68 y.o. female with a hx of anxiety, significant FH of early CAD (in her father, sister, uncle, aunt), who has no prior cardiac history, and is coming referred by her primary care physician as she was found to have abnormal EKG.  The patient has severe anxiety with elevated blood pressure most of the time, she walks almost daily, and lately has noticed to get more short of breath when walking uphill or stairs.  No chest pain.  No lower extremity edema orthopnea proximal dyspnea.  She is opposed to start taking any medications.   Past Medical History:  Diagnosis Date  . Anemia   . Anxiety   . Basal cell carcinoma (BCC) of left upper arm 12/2015  . Basal cell carcinoma (BCC) of right lower leg 04/2016  . Blood transfusion without reported diagnosis   . Dysplasia of cervix, low grade (CIN 1) 05/08/11  . Hyperparathyroidism, secondary ([place])    due to excess Vit D, normalized off Vit D, never treated  . Osteopenia   . Osteoporosis   . Pelvic fracture ([place])    from fall    Past Surgical History:  Procedure Laterality Date  . ANKLE SURGERY  03/14/2015   . BASAL CELL CARCINOMA EXCISION     Right arm 2017, and left leg 2018  . FRACTURE SURGERY     ankle  . MENISCUS REPAIR Right 1991  . SPLENECTOMY, TOTAL  1954   due to spherocytosis  . TUBAL LIGATION  1995    Current Medications: Current Meds  Medication Sig  . clonazePAM (KLONOPIN) 0.5 MG tablet Take 1 tablet (0.5 mg total) by mouth daily as needed for anxiety.  . Dermatological Products, Misc. (NEOSALUS) CREA Apply 1 application topically daily as needed. Reported on 03/06/2015  . halobetasol (ULTRAVATE)  0.05 % ointment APPLY TO AFFECTED AREA TWICE DAILY     Allergies:   Sulfa antibiotics and Penicillins   Social History   Socioeconomic History  . Marital status: Widowed    Spouse name: Not on file  . Number of children: Not on file  . Years of education: Not on file  . Highest education level: Not on file  Occupational History  . Not on file  Social Needs  . Financial resource strain: Not on file  . Food insecurity    Worry: Not on file    Inability: Not on file  . Transportation needs    Medical: Not on file    Non-medical: Not on file  Tobacco Use  . Smoking status: Never Smoker  . Smokeless tobacco: Never Used  Substance and Sexual Activity  . Alcohol use: Yes    Alcohol/week: 0.0 - 1.0 standard drinks  . Drug use: No  . Sexual activity: Not Currently    Partners: Male    Birth control/protection: Surgical    Comment: BTL  Lifestyle  . Physical activity    Days per week: Not on file    Minutes per session: Not on file  . Stress: Not on file  Relationships  . Social connections    Talks on phone: Not on file  Gets together: Not on file    Attends religious service: Not on file    Active member of club or organization: Not on file    Attends meetings of clubs or organizations: Not on file    Relationship status: Not on file  Other Topics Concern  . Not on file  Social History Narrative  . Not on file     Family History: The patient's family history includes Colon cancer in her mother; Crohn's disease in her daughter; Heart disease in her father and sister; Heart failure in her father; Hypertension in her father; Osteoporosis in her mother; Skin cancer in her father.  ROS:   Please see the history of present illness.    All other systems reviewed and are negative.  EKGs/Labs/Other Studies Reviewed:    The following studies were reviewed today:  EKG:  EKG is ordered today.  The ekg ordered today demonstrates sinus rhythm with significant LVH negative  T waves in inferior and anterolateral leads, this was personally reviewed.  This is unchanged from prior EKG in August 2020 but there is no EKG prior to that.  Recent Labs: 10/20/2018: ALT 27; BUN 10; Creatinine, Ser 0.70; Hemoglobin 13.7; Platelets 318.0; Potassium 4.3; Sodium 141; TSH 1.13  Recent Lipid Panel    Component Value Date/Time   CHOL 198 10/20/2018 1456   CHOL 204 (H) 08/21/2016 0939   TRIG 66.0 10/20/2018 1456   HDL 71.90 10/20/2018 1456   HDL 84 08/21/2016 0939   CHOLHDL 3 10/20/2018 1456   VLDL 13.2 10/20/2018 1456   LDLCALC 113 (H) 10/20/2018 1456   LDLCALC 110 (H) 08/21/2016 0939    Physical Exam:    VS:  BP (!) 156/86   Pulse 81   Ht 5' 3.5" (1.613 m)   Wt 151 lb 3.2 oz (68.6 kg)   LMP 02/24/2002   SpO2 98%   BMI 26.36 kg/m     Wt Readings from Last 3 Encounters:  11/15/18 151 lb 3.2 oz (68.6 kg)  11/11/18 151 lb (68.5 kg)  10/20/18 153 lb 12.8 oz (69.8 kg)     GEN: Well nourished, well developed in no acute distress HEENT: Normal NECK: No JVD; No carotid bruits LYMPHATICS: No lymphadenopathy CARDIAC: RRR, 2/6 systolic murmurs, rubs, gallops RESPIRATORY:  Clear to auscultation without rales, wheezing or rhonchi  ABDOMEN: Soft, non-tender, non-distended MUSCULOSKELETAL:  No edema; No deformity  SKIN: Warm and dry NEUROLOGIC:  Alert and oriented x 3 PSYCHIATRIC:  Normal affect   ASSESSMENT:    1. Hyperlipidemia, unspecified hyperlipidemia type   2. Family history of early CAD   3. Chest pain of uncertain etiology   4. DOE (dyspnea on exertion)   5. Precordial pain    PLAN:    In order of problems listed above:  1. Dyspnea on exertion, significant family history of coronary artery disease that is premature, risk factors include family history, hypertension, hyperlipidemia anxiety.  We will obtain coronary CTA to further evaluate.  2.  Hypertension -appears to be significantly elevated with findings of LVH with repolarization abnormalities on  her EKG, she is going through almost anxiety attack here when suggesting blood pressure medication, wants to start nitro treatment and exercise first, as trying to explain to her endorgan damage when blood pressure elevated for longer time.  We will obtain an echocardiogram to evaluate for systolic diastolic LV function and degree of LVH and then discussed therapies.  3.  Hyperlipidemia -with LDL of 109, we will tailor therapy  after we get results from coronary CTA.   Medication Adjustments/Labs and Tests Ordered: Current medicines are reviewed at length with the patient today.  Concerns regarding medicines are outlined above.  Orders Placed This Encounter  Procedures  . CT CORONARY MORPH W/CTA COR W/SCORE W/CA W/CM &/OR WO/CM  . CT CORONARY FRACTIONAL FLOW RESERVE DATA PREP  . CT CORONARY FRACTIONAL FLOW RESERVE FLUID ANALYSIS  . Basic metabolic panel  . EKG 12-Lead  . ECHOCARDIOGRAM COMPLETE   Meds ordered this encounter  Medications  . metoprolol tartrate (LOPRESSOR) 100 MG tablet    Sig: Take 1 tablet (100 mg total) by mouth once for 1 dose. Take 2 hours prior to your Coronary CT.    Dispense:  1 tablet    Refill:  0    Patient Instructions  Medication Instructions:   Your physician recommends that you continue on your current medications as directed. Please refer to the Current Medication list given to you today.  If you need a refill on your cardiac medications before your next appointment, please call your pharmacy.    Lab work:  [place] TO YOUR CORONARY CT PER [place] BMET  If you have labs (blood work) drawn today and your tests are completely normal, you will receive your results only by: Marland Kitchen MyChart Message (if you have MyChart) OR . A paper copy in the mail If you have any lab test that is abnormal or we need to change your treatment, we will call you to review the results.    Testing/Procedures:  Your physician has requested that you  have an echocardiogram. Echocardiography is a painless test that uses sound waves to create images of your heart. It provides your doctor with information about the size and shape of your heart and how well your heart's chambers and valves are working. This procedure takes approximately one hour. There are no restrictions for this procedure.   CORONARY CT Your cardiac CT will be scheduled at one of the below locations:   [facility] [address] [place], [place] [address] [phone]  If scheduled at [facility], please arrive at the [facility] main entrance of [facility] 30-45 minutes prior to test start time. Proceed to the [facility] Radiology Department (first floor) to check-in and test prep.   Please follow these instructions carefully (unless otherwise directed):  On the Night Before the Test: . Be sure to Drink plenty of water. . Do not consume any caffeinated/decaffeinated beverages or chocolate 12 hours prior to your test. . Do not take any antihistamines 12 hours prior to your test.   On the Day of the Test: . Drink plenty of water. Do not drink any water within one hour of the test. . Do not eat any food 4 hours prior to the test. . You may take your regular medications prior to the test.  . Take metoprolol 100 MG  (Lopressor) two hours prior to test. . FEMALES- please wear underwire-free bra if available        After the Test: . Drink plenty of water. . After receiving IV contrast, you may experience a mild flushed feeling. This is normal. . On occasion, you may experience a mild rash up to 24 hours after the test. This is not dangerous. If this occurs, you can take Benadryl 25 mg and increase your fluid intake. . If you experience trouble breathing, this can be serious. If it is severe call 911  IMMEDIATELY. If it is mild, please call our office.   Please contact the cardiac imaging nurse navigator should you have any  questions/concerns Marchia Bond, RN Navigator Cardiac Imaging [facility] [phone] Office  [phone] Cell     Follow-Up:  2 MONTHS WITH AN EXTENDER IN OUR OFFICE      Signed, Ena Dawley, MD  11/15/2018 9:47 AM    [place]

## 2018-11-17 ENCOUNTER — Other Ambulatory Visit (HOSPITAL_COMMUNITY): Payer: Medicare HMO

## 2018-11-17 ENCOUNTER — Telehealth: Payer: Self-pay | Admitting: *Deleted

## 2018-11-17 ENCOUNTER — Telehealth: Payer: Self-pay

## 2018-11-17 NOTE — Telephone Encounter (Signed)
10/16/18 labs printed and upfront for p/u. Pt informed.

## 2018-11-17 NOTE — Telephone Encounter (Signed)
Advised pt to make a mychart message so she can communicate directly with Dr. Quay Burow or she can make an OV to talk face to face with her. She will make the mychart and send a message that way. Text sent to help create mychart.

## 2018-11-17 NOTE — Telephone Encounter (Signed)
Copied from [place] [phone] [id number]. Topic: General - Other >> Nov 16, 2018 12:11 PM Burchel, Abbi R wrote: Reason for CRM: Pt requesting call back from Dr Quay Burow re: upcoming procedure(s) and recent appt w/Cardio.  Please call pt:  [phone]

## 2018-11-18 ENCOUNTER — Ambulatory Visit: Payer: Medicare HMO | Admitting: Cardiology

## 2018-11-19 ENCOUNTER — Encounter: Payer: Self-pay | Admitting: Internal Medicine

## 2018-11-23 ENCOUNTER — Telehealth: Payer: Self-pay | Admitting: Obstetrics & Gynecology

## 2018-11-23 NOTE — Telephone Encounter (Signed)
Patient states she has been expecting a call to schedule an outpatient procedure.

## 2018-11-23 NOTE — Telephone Encounter (Signed)
Spoke with patient. Patient is requesting return call to further discuss surgical planning for conization and ECC in OR.   Patient wants Alicia Wilkerson to be aware since her OV on 11/11/18, her cardiologist has recommended an echocardiogram and cardia CT. She is scheduled for the echo on 9/30. Advised patient Alicia Wilkerson does the surgery scheduling, she is not in the office today, will forward message for return call. Will also forward to business office. I will update Alicia Wilkerson. Advised patient she should proceed with testing recommended by her cardiologist. Patient agreeable.    Routing to Alicia Wilkerson  Cc: Alicia Snowball, RN, Alicia Wilkerson

## 2018-11-24 ENCOUNTER — Ambulatory Visit (HOSPITAL_COMMUNITY): Payer: Medicare HMO | Attending: Cardiovascular Disease

## 2018-11-24 ENCOUNTER — Other Ambulatory Visit: Payer: Self-pay

## 2018-11-24 DIAGNOSIS — E785 Hyperlipidemia, unspecified: Secondary | ICD-10-CM | POA: Diagnosis present

## 2018-11-24 DIAGNOSIS — Z8249 Family history of ischemic heart disease and other diseases of the circulatory system: Secondary | ICD-10-CM | POA: Insufficient documentation

## 2018-11-24 DIAGNOSIS — R0789 Other chest pain: Secondary | ICD-10-CM | POA: Diagnosis present

## 2018-11-24 DIAGNOSIS — R0609 Other forms of dyspnea: Secondary | ICD-10-CM | POA: Insufficient documentation

## 2018-11-24 DIAGNOSIS — R079 Chest pain, unspecified: Secondary | ICD-10-CM

## 2018-11-24 DIAGNOSIS — R0600 Dyspnea, unspecified: Secondary | ICD-10-CM

## 2018-11-24 DIAGNOSIS — R072 Precordial pain: Secondary | ICD-10-CM | POA: Insufficient documentation

## 2018-11-24 NOTE — Telephone Encounter (Signed)
Call from patient. Very anxious about to schedule surgery.  States she has been cleared by cardiology. They have ordered a CT scan but she is able to schedule surgery.    Patient desires to proceed ASAP so that any future surgery can be completed this year. Will review with Dr Sabra Heck and call patient back.

## 2018-11-25 ENCOUNTER — Telehealth: Payer: Self-pay | Admitting: *Deleted

## 2018-11-25 DIAGNOSIS — I5189 Other ill-defined heart diseases: Secondary | ICD-10-CM

## 2018-11-25 NOTE — Telephone Encounter (Signed)
Patient is calling regarding surgery date of 12/03/2018. Patient is stated that she would rather have surgery on 12/03/2018 and move her appointment on 12/01/2018, than to wait.

## 2018-11-25 NOTE — Telephone Encounter (Signed)
Routing to S. Yeakley, RN.  

## 2018-11-25 NOTE — Telephone Encounter (Signed)
-----   Message from Nuala Alpha, LPN sent at 624THL  4:10 PM EDT -----  ----- Message ----- From: Nuala Alpha, LPN Sent: 624THL  11:13 AM EDT To: [facility]   ----- Message ----- From: Sueanne Margarita, MD Sent: 11/25/2018  11:04 AM EDT To: Nuala Alpha, LPN, Binnie Rail, MD  Given diastolic dysfunction please have her come in for BNP

## 2018-11-25 NOTE — Telephone Encounter (Signed)
Left the pt a message to call the office back for echo results and recommendations per Dr. Osa Craver for Dr. Meda Coffee.  Will go ahead and add on lab for PRO-BNP to be done, to her lab appt tomorrow, as scheduled at our office, to check a BMET.

## 2018-11-25 NOTE — Telephone Encounter (Signed)
Notes recorded by Sueanne Margarita, MD on 11/25/2018 at 11:03 AM EDT  Echo showed normal LVF with stiffness of heart muscle, mildly leaky TV

## 2018-11-25 NOTE — Telephone Encounter (Signed)
The alternate date you gave me will be fine.  Ok to schedule this.

## 2018-11-25 NOTE — Telephone Encounter (Signed)
Call to patient. She desires to proceed on 12-03-18. Surgery scheduled as requested on 12-03-18 at 0730 at [facility].  Surgery instruction sheet reviewed and printed copy will be mailed with hospital brochure and Covid testing instructions.   Routing to provider. Encounter closed.

## 2018-11-25 NOTE — Telephone Encounter (Signed)
Patient returning call.

## 2018-11-26 ENCOUNTER — Other Ambulatory Visit: Payer: Medicare HMO

## 2018-11-26 ENCOUNTER — Other Ambulatory Visit: Payer: Self-pay | Admitting: Obstetrics & Gynecology

## 2018-11-26 ENCOUNTER — Other Ambulatory Visit (HOSPITAL_COMMUNITY): Payer: Self-pay | Admitting: Obstetrics & Gynecology

## 2018-11-26 ENCOUNTER — Telehealth: Payer: Self-pay | Admitting: *Deleted

## 2018-11-26 DIAGNOSIS — R87619 Unspecified abnormal cytological findings in specimens from cervix uteri: Secondary | ICD-10-CM

## 2018-11-26 NOTE — Telephone Encounter (Signed)
Notes recorded by Nuala Alpha, LPN on 624THL at [id number] PM EDT  RE: PT NEEDS TO RESCHEDULE LAB APPT AND CORONARY CT FOR NEXT WEEK  Received: Today  Message Contents  Jerlyn Ly, LPN      [id number] @ 624THL labs r/s for 12-10-18    ------   Notes recorded by Nuala Alpha, LPN on 624THL at [id number] PM EDT  Spoke with the pt and informed her of echo results and recommendations per Dr. Osa Craver for Dr. Meda Coffee.  Endorsed to the pt that we will add on a PRO-BNP to her BMET, scheduled for tomorrow, to further evaluate her diastolic dysfunction.  Per the pt, she states she needs to reschedule her lab appt scheduled for tomorrow and her upcoming Coronary CT, scheduled for next week, due to having to go into her OBGYN to have a minor outpatient procedure done.  Pt states she will have to have covid testing done next week for her outpatient procedure, and will have to go home and quarantine, until she goes to have this done.  Pt states she can have her Coronary CT done anytime after December 06, 2018. Informed the pt that I will make our [place] aware of this, and to call the pt tomorrow, to reschedule her lab appt to check bmet/pro-bnp and her coronary CT appt.  Informed the pt to expect a call from [place].  Pt verbalized understanding and agrees with this plan.  Pt was more than gracious for all the assistance provided.  ------   Notes recorded by Nuala Alpha, LPN on 624THL at 624THL PM EDT  Left the pt a message to call the office back for echo results and recommendations per Dr. Osa Craver for Dr. Meda Coffee.  Will go ahead and add on lab for PRO-BNP to be done, to her lab appt tomorrow, as scheduled at our office, to check a BMET.  ------

## 2018-11-26 NOTE — Telephone Encounter (Signed)
Call to patient to review final details of surgery. Patietn scheduled for post op visit on 01-07-19.  Encounter closed.

## 2018-11-30 ENCOUNTER — Other Ambulatory Visit (HOSPITAL_COMMUNITY)
Admission: RE | Admit: 2018-11-30 | Discharge: 2018-11-30 | Disposition: A | Discharge status: Home / Self Care | Payer: Medicare HMO | Source: Ambulatory Visit | Attending: Obstetrics & Gynecology | Admitting: Obstetrics & Gynecology

## 2018-11-30 ENCOUNTER — Other Ambulatory Visit: Payer: Self-pay | Admitting: Obstetrics & Gynecology

## 2018-11-30 ENCOUNTER — Other Ambulatory Visit: Payer: Self-pay

## 2018-11-30 ENCOUNTER — Encounter (HOSPITAL_BASED_OUTPATIENT_CLINIC_OR_DEPARTMENT_OTHER): Payer: Self-pay | Admitting: *Deleted

## 2018-11-30 DIAGNOSIS — Z20828 Contact with and (suspected) exposure to other viral communicable diseases: Secondary | ICD-10-CM | POA: Insufficient documentation

## 2018-11-30 DIAGNOSIS — Z01812 Encounter for preprocedural laboratory examination: Secondary | ICD-10-CM | POA: Insufficient documentation

## 2018-11-30 DIAGNOSIS — N879 Dysplasia of cervix uteri, unspecified: Secondary | ICD-10-CM

## 2018-11-30 NOTE — Telephone Encounter (Signed)
They typically do a finger stick for the BMP and CBC with an I-stat machine.  I did order it and I think we can have phlebotomy come and draw it on Friday morning when she has her procedure.  I made a note to myself to not forget.  If there is some reason it can't be done at the outpatient surgical center on Friday, I will let you know (and Alicia Wilkerson as well).

## 2018-11-30 NOTE — Telephone Encounter (Signed)
Pt is having a BMET/CBC done by Dr. Hale Bogus soon.  Pt will need a bmet anyways, for upcoming Coronary CT, per protocol, on 10/19,  Pt is inquiring since she will have her labs done by Dr. Sabra Heck, can we endorse to her to draw a PRO-BNP as well, as ordered by Dr. Osa Craver for Dr. Meda Coffee, so she can avoid multiple lab visits.  Informed the pt that I will send Dr. Sabra Heck a message through the computer asking if she will also check a PRO-BNP on the pt, and orders already available in Epic.  Pt verbalized understanding and agrees with this plan.

## 2018-11-30 NOTE — Telephone Encounter (Signed)
Follow up   Patient has questions about her lab work. Please call.

## 2018-11-30 NOTE — Progress Notes (Addendum)
Spoke w/ via phone for pre-op interview---  PT Lab needs dos--- CBC and BMP (current ekg in chart and epic) COVID test ------  11-30-2018 Arrive at -------  0530 NPO after ------  mn Medications to take morning of surgery -----  Klonopin Diabetic medication -----  n/a Patient Special Instructions -----  n/a Pre-Op special Istructions ----- due to pt unaware , reviewed with pt of anesthesia recommendation to have caregiver 24 hours post op due to anesthesia, pt verbalized understanding Patient verbalized understanding of instructions that were given at this phone interview. Patient denies shortness of breath, chest pain, fever, cough a this phone interview.   Anesthesia Review:   pt was sent by pcp for abnormal ekg from last year to cardiology for evaluation since FH premature CAD. Was not able to get appointment until 11-15-2018. Pt with severe anxiety with per pt elevated blood pressure.  Pt denies any cardiac s&s and no doe.    PCP:  dr Marzetta Board burns (lov in epic) Cardiologist :  dr Liane Comber (lov in epic 11-15-2018, stated in note no ekg change since last year. Chest x-ray :  11-07-2016 epic EKG :  11-15-2018 epic Echo :   11-24-2018 epic Stress test:  no Cardiac Cath :   no Sleep Study/ CPAP :  NO Fasting Blood Sugar :    N/A Blood Thinner/ Instructions /Last Dose:  no ASA / Instructions/ Last Dose :   no

## 2018-12-01 ENCOUNTER — Ambulatory Visit (HOSPITAL_COMMUNITY): Payer: Medicare HMO

## 2018-12-01 LAB — NOVEL CORONAVIRUS, NAA (HOSP ORDER, SEND-OUT TO REF LAB; TAT 18-24 HRS): SARS-CoV-2, NAA: NOT DETECTED

## 2018-12-01 NOTE — Telephone Encounter (Signed)
Thank you so much Dr. Sabra Heck, we appreciate you!

## 2018-12-02 ENCOUNTER — Other Ambulatory Visit: Payer: Self-pay | Admitting: Obstetrics & Gynecology

## 2018-12-02 ENCOUNTER — Other Ambulatory Visit (HOSPITAL_COMMUNITY): Payer: Self-pay | Admitting: Obstetrics & Gynecology

## 2018-12-02 DIAGNOSIS — R87619 Unspecified abnormal cytological findings in specimens from cervix uteri: Secondary | ICD-10-CM

## 2018-12-02 NOTE — Progress Notes (Addendum)
Gay Filler, surgery scheduler for Dr. Sabra Heck, to have abbreviations on consent order spelled out by Dr. Sabra Heck

## 2018-12-03 ENCOUNTER — Encounter (HOSPITAL_BASED_OUTPATIENT_CLINIC_OR_DEPARTMENT_OTHER): Payer: Self-pay

## 2018-12-03 ENCOUNTER — Encounter (HOSPITAL_BASED_OUTPATIENT_CLINIC_OR_DEPARTMENT_OTHER): Payer: Self-pay | Admitting: Anesthesiology

## 2018-12-03 ENCOUNTER — Ambulatory Visit (HOSPITAL_COMMUNITY)
Admission: RE | Admit: 2018-12-03 | Discharge: 2018-12-03 | Disposition: A | Discharge status: Home / Self Care | Payer: Medicare HMO | Source: Ambulatory Visit | Attending: Obstetrics & Gynecology | Admitting: Obstetrics & Gynecology

## 2018-12-03 ENCOUNTER — Ambulatory Visit (HOSPITAL_BASED_OUTPATIENT_CLINIC_OR_DEPARTMENT_OTHER)
Admission: RE | Admit: 2018-12-03 | Discharge: 2018-12-03 | Disposition: A | Discharge status: Home / Self Care | Payer: Medicare HMO | Attending: Obstetrics & Gynecology | Admitting: Obstetrics & Gynecology

## 2018-12-03 ENCOUNTER — Encounter (HOSPITAL_BASED_OUTPATIENT_CLINIC_OR_DEPARTMENT_OTHER)
Admission: RE | Disposition: A | Discharge status: Home / Self Care | Payer: Self-pay | Source: Home / Self Care | Attending: Obstetrics & Gynecology

## 2018-12-03 ENCOUNTER — Other Ambulatory Visit: Payer: Self-pay | Admitting: Obstetrics & Gynecology

## 2018-12-03 DIAGNOSIS — E7800 Pure hypercholesterolemia, unspecified: Secondary | ICD-10-CM

## 2018-12-03 DIAGNOSIS — R87619 Unspecified abnormal cytological findings in specimens from cervix uteri: Secondary | ICD-10-CM | POA: Diagnosis not present

## 2018-12-03 DIAGNOSIS — Z5309 Procedure and treatment not carried out because of other contraindication: Secondary | ICD-10-CM | POA: Insufficient documentation

## 2018-12-03 HISTORY — DX: Anxiety disorder, unspecified: F41.9

## 2018-12-03 HISTORY — DX: Acquired absence of spleen: Z90.81

## 2018-12-03 HISTORY — DX: Personal history of diseases of the blood and blood-forming organs and certain disorders involving the immune mechanism: Z86.2

## 2018-12-03 HISTORY — DX: Moderate cervical dysplasia: N87.1

## 2018-12-03 HISTORY — DX: Presence of spectacles and contact lenses: Z97.3

## 2018-12-03 HISTORY — DX: Dermatitis, unspecified: L30.9

## 2018-12-03 HISTORY — DX: Elevated blood-pressure reading, without diagnosis of hypertension: R03.0

## 2018-12-03 HISTORY — DX: Personal history of other endocrine, nutritional and metabolic disease: Z86.39

## 2018-12-03 HISTORY — DX: Personal history of other malignant neoplasm of skin: Z85.828

## 2018-12-03 HISTORY — DX: Personal history of other malignant neoplasm of skin: Z98.890

## 2018-12-03 HISTORY — DX: Family history of ischemic heart disease and other diseases of the circulatory system: Z82.49

## 2018-12-03 LAB — BASIC METABOLIC PANEL
Anion gap: 13 (ref 5–15)
BUN: 19 mg/dL (ref 8–23)
CO2: 24 mmol/L (ref 22–32)
Calcium: 9.7 mg/dL (ref 8.9–10.3)
Chloride: 102 mmol/L (ref 98–111)
Creatinine, Ser: 0.75 mg/dL (ref 0.44–1.00)
GFR calc Af Amer: >60 mL/min (ref >60)
GFR calc non Af Amer: >60 mL/min (ref >60)
Glucose, Bld: 104 mg/dL — ABNORMAL HIGH (ref 70–99)
Potassium: 4.2 mmol/L (ref 3.5–5.1)
Sodium: 139 mmol/L (ref 135–145)

## 2018-12-03 LAB — CBC
HCT: 40.9 % (ref 36.0–46.0)
Hemoglobin: 14.2 g/dL (ref 12.0–15.0)
MCH: 34.3 pg — ABNORMAL HIGH (ref 26.0–34.0)
MCHC: 34.7 g/dL (ref 30.0–36.0)
MCV: 98.8 fL (ref 80.0–100.0)
Platelets: 188 10*3/uL (ref 150–400)
RBC: 4.14 MIL/uL (ref 3.87–5.11)
RDW: 16.1 % — ABNORMAL HIGH (ref 11.5–15.5)
WBC: 9.2 10*3/uL (ref 4.0–10.5)
nRBC: 0 % (ref 0.0–0.2)

## 2018-12-03 LAB — BRAIN NATRIURETIC PEPTIDE: B Natriuretic Peptide: 103.4 pg/mL — ABNORMAL HIGH (ref 0.0–100.0)

## 2018-12-03 SURGERY — CONE BIOPSY, CERVIX
Anesthesia: Choice

## 2018-12-03 MED ORDER — PROPOFOL 10 MG/ML IV BOLUS
INTRAVENOUS | Status: AC
  Filled 2018-12-03: qty 20

## 2018-12-03 MED ORDER — DEXAMETHASONE SODIUM PHOSPHATE 10 MG/ML IJ SOLN
INTRAMUSCULAR | Status: AC
  Filled 2018-12-03: qty 1

## 2018-12-03 MED ORDER — MIDAZOLAM HCL 2 MG/2ML IJ SOLN
INTRAMUSCULAR | Status: AC
  Filled 2018-12-03: qty 2

## 2018-12-03 MED ORDER — FENTANYL CITRATE (PF) 100 MCG/2ML IJ SOLN
INTRAMUSCULAR | Status: AC
  Filled 2018-12-03: qty 2

## 2018-12-03 MED ORDER — ACETAMINOPHEN 500 MG PO TABS
ORAL_TABLET | ORAL | Status: AC
  Filled 2018-12-03: qty 2

## 2018-12-03 MED ORDER — LIDOCAINE 2% (20 MG/ML) 5 ML SYRINGE
INTRAMUSCULAR | Status: AC
  Filled 2018-12-03: qty 5

## 2018-12-03 MED ORDER — LACTATED RINGERS IV SOLN
INTRAVENOUS | Status: DC
  Filled 2018-12-03: qty 1000

## 2018-12-03 MED ORDER — ACETAMINOPHEN 500 MG PO TABS
1000.0000 mg | ORAL_TABLET | ORAL | Status: AC
  Administered 2018-12-03: 1000 mg via ORAL
  Filled 2018-12-03: qty 2

## 2018-12-03 SURGICAL SUPPLY — 31 items
BLADE SURG 11 STRL SS (BLADE) IMPLANT
CANISTER SUCT 3000ML PPV (MISCELLANEOUS) IMPLANT
CANISTER SUCTION 1200CC (MISCELLANEOUS) IMPLANT
COVER BACK TABLE 60X90IN (DRAPES) ×1 IMPLANT
COVER WAND RF STERILE (DRAPES) ×2 IMPLANT
DRAPE HYSTEROSCOPY (DRAPE) ×1 IMPLANT
DRAPE SHEET LG 3/4 BI-LAMINATE (DRAPES) ×1 IMPLANT
ELECT BALL LEEP 3MM BLK (ELECTRODE) IMPLANT
ELECT BALL LEEP 5MM RED (ELECTRODE) IMPLANT
ELECT LOOP LEEP RND 10X10 YLW (CUTTING LOOP)
ELECT LOOP LEEP RND 15X12 GRN (CUTTING LOOP)
ELECT LOOP LEEP RND 20X12 WHT (CUTTING LOOP)
ELECTRODE LOOP LP RND 10X10YLW (CUTTING LOOP) IMPLANT
ELECTRODE LOOP LP RND 15X12GRN (CUTTING LOOP) IMPLANT
ELECTRODE LOOP LP RND 20X12WHT (CUTTING LOOP) IMPLANT
GLOVE BIOGEL PI IND STRL 7.0 (GLOVE) ×1 IMPLANT
GLOVE BIOGEL PI INDICATOR 7.0 (GLOVE)
GLOVE ECLIPSE 6.5 STRL STRAW (GLOVE) ×2 IMPLANT
GOWN STRL REUS W/ TWL LRG LVL3 (GOWN DISPOSABLE) ×1 IMPLANT
GOWN STRL REUS W/TWL LRG LVL3 (GOWN DISPOSABLE)
KIT TURNOVER CYSTO (KITS) ×1 IMPLANT
LEGGING LITHOTOMY PAIR STRL (DRAPES) ×1 IMPLANT
PACK BASIN DAY SURGERY FS (CUSTOM PROCEDURE TRAY) ×1 IMPLANT
PAD OB MATERNITY 4.3X12.25 (PERSONAL CARE ITEMS) ×1 IMPLANT
PAD PREP 24X48 CUFFED NSTRL (MISCELLANEOUS) ×1 IMPLANT
SUT VIC AB 0 CT1 36 (SUTURE) IMPLANT
TOWEL OR 17X26 10 PK STRL BLUE (TOWEL DISPOSABLE) ×1 IMPLANT
TRAY DSU PREP LF (CUSTOM PROCEDURE TRAY) ×1 IMPLANT
TUBE CONNECTING 12X1/4 (SUCTIONS) IMPLANT
VACUUM HOSE/TUBING 7/8INX6FT (MISCELLANEOUS) ×1 IMPLANT
YANKAUER SUCT BULB TIP NO VENT (SUCTIONS) IMPLANT

## 2018-12-03 NOTE — Telephone Encounter (Signed)
Thank you so much.  BNP is perfect. We have to place it as PRO-BNP for [facility] to run, but its the same thing.  Really appreciate you doing this. Hate her surgery was cancelled and thank you for keeping [place] updated.

## 2018-12-03 NOTE — Progress Notes (Signed)
Patient ID: Alicia Wilkerson, female   DOB: 01-17-1951, 68 y.o.   MRN: [id number]  Pt is here for scheduled procedure.  She has not arranged for any family member to be with her post procedure.  She does need anesthesia for this procedure.  Hospital policy states she must have someone with her this evening.  Surgery needs to be cancelled.  Pt is very upset.  We will work to reschedule if possible ASAP.

## 2018-12-03 NOTE — Anesthesia Preprocedure Evaluation (Deleted)
Anesthesia Evaluation  Patient identified by MRN, date of birth, ID band Patient awake    Reviewed: Allergy & Precautions, NPO status , Patient's Chart, lab work & pertinent test results  Airway        Dental   Pulmonary neg pulmonary ROS,           Cardiovascular hypertension, negative cardio ROS    TTE 10/2018 EF 60%, valves ok   Neuro/Psych PSYCHIATRIC DISORDERS Anxiety negative neurological ROS     GI/Hepatic negative GI ROS, Neg liver ROS,   Endo/Other  negative endocrine ROS  Renal/GU negative Renal ROS  negative genitourinary   Musculoskeletal negative musculoskeletal ROS (+)   Abdominal   Peds  Hematology  (+) Blood dyscrasia, anemia ,   Anesthesia Other Findings   Reproductive/Obstetrics                             Anesthesia Physical Anesthesia Plan  ASA: II  Anesthesia Plan: General   Post-op Pain Management:    Induction: Intravenous  PONV Risk Score and Plan: Ondansetron, Dexamethasone and Midazolam  Airway Management Planned: LMA  Additional Equipment:   Intra-op Plan:   Post-operative Plan: Extubation in OR  Informed Consent: I have reviewed the patients History and Physical, chart, labs and discussed the procedure including the risks, benefits and alternatives for the proposed anesthesia with the patient or authorized representative who has indicated his/her understanding and acceptance.     Dental advisory given  Plan Discussed with: CRNA  Anesthesia Plan Comments: (Procedure cancelled. Pt does not have anyone to stay with her for the first 24 hours. )       Anesthesia Quick Evaluation

## 2018-12-03 NOTE — Telephone Encounter (Signed)
I ordered the pro-BNP on her but it just came back as a BNP and that maybe is not what you need.  I'm sorry if I did not do it correctly.  I will route the results to you.    Just FYI, Alicia Wilkerson's surgery was cancelled this morning.  She did not have anyone to stay with her tonight which is a hospital policy.  She will be rescheduled about 10 days from now.  My office is working on this today.

## 2018-12-06 ENCOUNTER — Telehealth: Payer: Self-pay | Admitting: *Deleted

## 2018-12-06 NOTE — Telephone Encounter (Signed)
Spoke with patient.   Advised of LEEP/COLPO scheduled for 12/13/18 at 12:30pm at [facility], address provided.    Advised patient to arrive at 10:30am, reviewed pre-op instructions.   Covid 19 testing scheduled for 10/15 at 1:30pm. Patient is agreeable to date and time.   Post-op OV with Dr. Sabra Heck 11/13 at 8am.   Patient verbalizes understanding and is agreeable.   Routing to provider for final review. Patient is agreeable to disposition. Will close encounter.  Cc: Lamont Snowball, RN

## 2018-12-09 ENCOUNTER — Other Ambulatory Visit (HOSPITAL_COMMUNITY)
Admission: RE | Admit: 2018-12-09 | Discharge: 2018-12-09 | Disposition: A | Discharge status: Home / Self Care | Payer: Medicare HMO | Source: Ambulatory Visit | Attending: Obstetrics & Gynecology | Admitting: Obstetrics & Gynecology

## 2018-12-09 DIAGNOSIS — Z20828 Contact with and (suspected) exposure to other viral communicable diseases: Secondary | ICD-10-CM | POA: Insufficient documentation

## 2018-12-09 DIAGNOSIS — Z01812 Encounter for preprocedural laboratory examination: Secondary | ICD-10-CM | POA: Diagnosis not present

## 2018-12-10 ENCOUNTER — Other Ambulatory Visit: Payer: Medicare HMO

## 2018-12-10 ENCOUNTER — Encounter (HOSPITAL_COMMUNITY): Payer: Self-pay | Admitting: Orthopedic Surgery

## 2018-12-10 NOTE — Anesthesia Preprocedure Evaluation (Addendum)
Anesthesia Evaluation  Patient identified by MRN, date of birth, ID band Patient awake    Reviewed: Allergy & Precautions, NPO status , Patient's Chart, lab work & pertinent test results  History of Anesthesia Complications (+) PONV and history of anesthetic complications  Airway Mallampati: I       Dental no notable dental hx. (+) Teeth Intact   Pulmonary neg pulmonary ROS,    Pulmonary exam normal breath sounds clear to auscultation       Cardiovascular hypertension, Normal cardiovascular exam Rhythm:Regular Rate:Normal     Neuro/Psych PSYCHIATRIC DISORDERS Anxiety negative neurological ROS     GI/Hepatic negative GI ROS, Neg liver ROS,   Endo/Other    Renal/GU negative Renal ROS  negative genitourinary   Musculoskeletal negative musculoskeletal ROS (+)   Abdominal Normal abdominal exam  (+)   Peds  Hematology   Anesthesia Other Findings   Reproductive/Obstetrics                           Anesthesia Physical Anesthesia Plan  ASA: II  Anesthesia Plan: MAC   Post-op Pain Management:    Induction:   PONV Risk Score and Plan: Ondansetron, Dexamethasone and Propofol infusion  Airway Management Planned: Natural Airway, Simple Face Mask and Nasal Cannula  Additional Equipment: None  Intra-op Plan:   Post-operative Plan: Extubation in OR  Informed Consent: I have reviewed the patients History and Physical, chart, labs and discussed the procedure including the risks, benefits and alternatives for the proposed anesthesia with the patient or authorized representative who has indicated his/her understanding and acceptance.       Plan Discussed with: CRNA  Anesthesia Plan Comments: (PAT note written 12/10/2018 by Myra Gianotti, PA-C. )      Anesthesia Quick Evaluation

## 2018-12-10 NOTE — Progress Notes (Signed)
Anesthesia Chart Review: SAME DAY WORK-UP   Case: [id number] Date/Time: 12/13/18 1227   Procedures:      possible LOOP ELECTROSURGICAL EXCISION PROCEDURE (LEEP) (N/A )     OPERATIVE ULTRASOUND (N/A )     CONIZATION CERVIX WITH BIOPSY (N/A )   Anesthesia type: Choice   Pre-op diagnosis: CIN 2/3   Location: [facility] OR ROOM 08 / [place] [facility]   Surgeon: Megan Salon, MD      DISCUSSION: Patient is a 68 year old female scheduled for the above procedure. Surgery was initially schedueld for 12/03/18 at that [facility], but was cancelled on the day of surgery because she did not have someone to stay with her for 24 hours post-operatively.   History includes never smoker, anemia, skin cancer ([place]), hereditary spherocytosis (s/p splenectomy 1954), secondary hyperparathyroidism (resolved after stopping high dose vitamin D), anxiety, white coat hypertension. Family history of premature CAD.   She was recently evaluated by cardiologist Dr. Meda Coffee on 11/15/18 for abnormal EKG (LVH, T wave abnormality, present since at least 09/2017). Denied chest pain, but did report some DOE and family history of CAD. Echo and coronary CTA ordered. Echo showed normal LVEF, elevated LVEDP. BNP recommended by cardiology (ordered by Dr. Sabra Heck) was done on 12/03/18 and was 103 and considered "normal" by Dr. Meda Coffee.   Dr. Ammie Ferrier office is currently closed, but it appears she did forward patient's BNP result to Dr. Meda Coffee. Also notation by Jaymes Graff, RN on 11/23/18 indicated that patient reported clearance by cardiology (and could still schedule surgery without coronary CT completed). I discussed case with anesthesiologist Josephine Igo, MD. Assigned anesthesiologist to evaluate on the day of surgery, but if patient without concerning cardiopulmonary symptoms then it is anticipated that she can proceed as planned.   12/09/18 COVID-19 test in process.    VS: LMP 02/24/2002   BP Readings from Last 3 Encounters:  12/03/18 (!) 175/79   11/15/18 (!) 156/86  11/11/18 (!) 156/86    PROVIDERS: Binnie Rail, MD is PCP Ena Dawley, MD is cardiologist   LABS: As of 12/03/18, labs showed: Lab Results  Component Value Date   WBC 9.2 12/03/2018   HGB 14.2 12/03/2018   HCT 40.9 12/03/2018   PLT 188 12/03/2018   GLUCOSE 104 (H) 12/03/2018   CHOL 198 10/20/2018   TRIG 66.0 10/20/2018   HDL 71.90 10/20/2018   LDLCALC 113 (H) 10/20/2018   ALT 27 10/20/2018   AST 28 10/20/2018   NA 139 12/03/2018   K 4.2 12/03/2018   CL 102 12/03/2018   CREATININE 0.75 12/03/2018   BUN 19 12/03/2018   CO2 24 12/03/2018   TSH 1.13 10/20/2018   HGBA1C 3.9 Repeated and verified X2. (L) 10/20/2018  BNP 103 on 12/03/18.    EKG: 11/15/18: Sinus rhythm with first-degree AV block Minimal voltage criteria for LVH, may be normal variant Septal infarct, age undetermined ST and T wave abnormality, consider inferior ischemia ST and T wave abnormality, consider anterolateral ischemia - No significant change when compared to 10/13/17 tracing   CV: Coronary CT is scheduled for 12/22/18.    Echo 11/24/18: IMPRESSIONS  1. Left ventricular ejection fraction, by visual estimation, is 60 to 65%. The left ventricle has normal function. Normal left ventricular size. There is no left ventricular hypertrophy.  2. Elevated left ventricular end-diastolic pressure.  3. Left ventricular diastolic Doppler parameters are consistent with impaired relaxation pattern of LV diastolic filling.  4. Global right ventricle has normal systolic function.The right  ventricular size is normal. No increase in right ventricular wall thickness.  5. Left atrial size was normal.  6. Right atrial size was normal.  7. The mitral valve is normal in structure. No evidence of mitral valve regurgitation. No evidence of mitral stenosis.  8. The tricuspid valve is normal in structure. Tricuspid valve regurgitation is mild.  9. The aortic valve was not well visualized Aortic  valve regurgitation was not visualized by color flow Doppler. Structurally normal aortic valve, with no evidence of sclerosis or stenosis. 10. The pulmonic valve was normal in structure. Pulmonic valve regurgitation is not visualized by color flow Doppler. 11. Normal pulmonary artery systolic pressure. 12. The inferior vena cava is normal in size with greater than 50% respiratory variability, suggesting right atrial pressure of 3 mmHg.   Past Medical History:  Diagnosis Date  . Anemia   . Anxiety disorder   . Basal cell carcinoma (BCC) of left upper arm 12/2015  . Basal cell carcinoma (BCC) of right lower leg 04/2016  . CIN II (cervical intraepithelial neoplasia II)    CIN  1  . Eczema   . Family history of premature CAD   . History of basal cell carcinoma (BCC) excision    left upper arm 2017;   right lower leg 2018;  bilateral lower leg and hip area 2019;   inner right lower leg, outer right thigh, & upper outside left arm 09/ 2020  . History of cervical dysplasia 2013  . History of hereditary spherocytosis    s/p splenectomy in 1954  . History of hyperparathyroidism    per pt yrs ago was put on mega dose of vit d which caused the hyperparathyroid resolved after stopping vit d  . History of pelvic fracture 2013  . Osteoporosis   . S/P splenectomy    spherocytosis  . Wears glasses   . White coat syndrome without diagnosis of hypertension     Past Surgical History:  Procedure Laterality Date  . ANKLE SURGERY  03/14/2015   . KNEE SURGERY Left 1991   per pt retained hardward  . ORIF ANKLE FRACTURE Left 03/14/2015   per pt retained hardware  . SPLENECTOMY, TOTAL  1954   due to spherocytosis  . TUBAL LIGATION Bilateral 1995    MEDICATIONS: No current facility-administered medications for this encounter.    . clonazePAM (KLONOPIN) 0.5 MG tablet  . Dermatological Products, Misc. (NEOSALUS) CREA  . halobetasol (ULTRAVATE) 0.05 % ointment    Myra Gianotti, PA-C Surgical  Short Stay/Anesthesiology [facility] Phone [phone] [facility] Phone [phone] 12/10/2018 4:29 PM

## 2018-12-10 NOTE — Progress Notes (Signed)
SDW pre-op phone call completed with patient. Patient denies sob, chest pain or fevers.   Patient does endorse high blood pressures on day of procedures d/t anxiety.   Cardiologist: Dr. Ena Dawley  Patient to take Klonopin morning of surgery with a sip of water.   NPO after midnight  Dimas Chyle will be dropping off/picking up patient day of surgery and will be staying with her for the 24 hrs after the procedure.   Chart sent to Anesthesia for review

## 2018-12-11 LAB — NOVEL CORONAVIRUS, NAA (HOSP ORDER, SEND-OUT TO REF LAB; TAT 18-24 HRS): SARS-CoV-2, NAA: NOT DETECTED

## 2018-12-13 ENCOUNTER — Encounter (HOSPITAL_COMMUNITY): Payer: Self-pay

## 2018-12-13 ENCOUNTER — Ambulatory Visit (HOSPITAL_COMMUNITY): Payer: Medicare HMO

## 2018-12-13 ENCOUNTER — Ambulatory Visit (HOSPITAL_COMMUNITY)
Admission: RE | Admit: 2018-12-13 | Discharge: 2018-12-13 | Disposition: A | Discharge status: Home / Self Care | Payer: Medicare HMO | Attending: Obstetrics & Gynecology | Admitting: Obstetrics & Gynecology

## 2018-12-13 ENCOUNTER — Other Ambulatory Visit: Payer: Self-pay | Admitting: Obstetrics & Gynecology

## 2018-12-13 ENCOUNTER — Other Ambulatory Visit: Payer: Self-pay

## 2018-12-13 ENCOUNTER — Ambulatory Visit (HOSPITAL_COMMUNITY): Payer: Medicare HMO | Admitting: Vascular Surgery

## 2018-12-13 ENCOUNTER — Encounter (HOSPITAL_COMMUNITY)
Admission: RE | Disposition: A | Discharge status: Home / Self Care | Payer: Self-pay | Source: Home / Self Care | Attending: Obstetrics & Gynecology

## 2018-12-13 DIAGNOSIS — D069 Carcinoma in situ of cervix, unspecified: Secondary | ICD-10-CM | POA: Diagnosis not present

## 2018-12-13 DIAGNOSIS — Z79899 Other long term (current) drug therapy: Secondary | ICD-10-CM | POA: Insufficient documentation

## 2018-12-13 DIAGNOSIS — Z8249 Family history of ischemic heart disease and other diseases of the circulatory system: Secondary | ICD-10-CM | POA: Insufficient documentation

## 2018-12-13 DIAGNOSIS — Z85828 Personal history of other malignant neoplasm of skin: Secondary | ICD-10-CM | POA: Insufficient documentation

## 2018-12-13 DIAGNOSIS — N871 Moderate cervical dysplasia: Secondary | ICD-10-CM | POA: Diagnosis not present

## 2018-12-13 DIAGNOSIS — N888 Other specified noninflammatory disorders of cervix uteri: Secondary | ICD-10-CM | POA: Diagnosis not present

## 2018-12-13 DIAGNOSIS — F419 Anxiety disorder, unspecified: Secondary | ICD-10-CM | POA: Diagnosis not present

## 2018-12-13 DIAGNOSIS — I10 Essential (primary) hypertension: Secondary | ICD-10-CM | POA: Diagnosis not present

## 2018-12-13 DIAGNOSIS — R739 Hyperglycemia, unspecified: Secondary | ICD-10-CM | POA: Diagnosis not present

## 2018-12-13 DIAGNOSIS — R69 Illness, unspecified: Secondary | ICD-10-CM | POA: Diagnosis not present

## 2018-12-13 HISTORY — PX: CERVICAL CONIZATION W/BX: SHX1330

## 2018-12-13 HISTORY — DX: Nausea with vomiting, unspecified: R11.2

## 2018-12-13 HISTORY — DX: Other specified postprocedural states: Z98.890

## 2018-12-13 HISTORY — PX: LEEP: SHX91

## 2018-12-13 SURGERY — LEEP (LOOP ELECTROSURGICAL EXCISION PROCEDURE)
Anesthesia: General | Site: Vagina

## 2018-12-13 MED ORDER — DEXAMETHASONE SODIUM PHOSPHATE 10 MG/ML IJ SOLN
INTRAMUSCULAR | Status: DC | PRN
  Administered 2018-12-13: 5 mg via INTRAVENOUS

## 2018-12-13 MED ORDER — FENTANYL CITRATE (PF) 100 MCG/2ML IJ SOLN
INTRAMUSCULAR | Status: DC | PRN
  Administered 2018-12-13: 50 ug via INTRAVENOUS

## 2018-12-13 MED ORDER — PHENYLEPHRINE 40 MCG/ML (10ML) SYRINGE FOR IV PUSH (FOR BLOOD PRESSURE SUPPORT)
PREFILLED_SYRINGE | INTRAVENOUS | Status: AC
  Filled 2018-12-13: qty 10

## 2018-12-13 MED ORDER — PHENYLEPHRINE 40 MCG/ML (10ML) SYRINGE FOR IV PUSH (FOR BLOOD PRESSURE SUPPORT)
PREFILLED_SYRINGE | INTRAVENOUS | Status: DC | PRN
  Administered 2018-12-13 (×2): 80 ug via INTRAVENOUS

## 2018-12-13 MED ORDER — LACTATED RINGERS IV SOLN
INTRAVENOUS | Status: DC | PRN
  Administered 2018-12-13: 11:00:00 via INTRAVENOUS

## 2018-12-13 MED ORDER — ACETIC ACID 4% SOLUTION
Status: DC | PRN
  Administered 2018-12-13: 1 via TOPICAL

## 2018-12-13 MED ORDER — DEXAMETHASONE SODIUM PHOSPHATE 10 MG/ML IJ SOLN
INTRAMUSCULAR | Status: AC
  Filled 2018-12-13: qty 2

## 2018-12-13 MED ORDER — LIDOCAINE-EPINEPHRINE 1 %-1:100000 IJ SOLN
INTRAMUSCULAR | Status: AC
  Filled 2018-12-13: qty 1

## 2018-12-13 MED ORDER — ONDANSETRON HCL 4 MG/2ML IJ SOLN
INTRAMUSCULAR | Status: DC | PRN
  Administered 2018-12-13: 4 mg via INTRAVENOUS

## 2018-12-13 MED ORDER — PROPOFOL 10 MG/ML IV BOLUS
INTRAVENOUS | Status: DC | PRN
  Administered 2018-12-13: 20 mg via INTRAVENOUS

## 2018-12-13 MED ORDER — FENTANYL CITRATE (PF) 250 MCG/5ML IJ SOLN
INTRAMUSCULAR | Status: AC
  Filled 2018-12-13: qty 5

## 2018-12-13 MED ORDER — LIDOCAINE-EPINEPHRINE 1 %-1:100000 IJ SOLN
INTRAMUSCULAR | Status: DC | PRN
  Administered 2018-12-13: 10 mL

## 2018-12-13 MED ORDER — FERRIC SUBSULFATE 259 MG/GM EX SOLN
CUTANEOUS | Status: AC
  Filled 2018-12-13: qty 8

## 2018-12-13 MED ORDER — IODINE STRONG (LUGOLS) 5 % PO SOLN
ORAL | Status: AC
  Filled 2018-12-13: qty 1

## 2018-12-13 MED ORDER — PROPOFOL 500 MG/50ML IV EMUL
INTRAVENOUS | Status: DC | PRN
  Administered 2018-12-13: 100 ug/kg/min via INTRAVENOUS

## 2018-12-13 MED ORDER — MIDAZOLAM HCL 2 MG/2ML IJ SOLN
INTRAMUSCULAR | Status: AC
  Filled 2018-12-13: qty 2

## 2018-12-13 MED ORDER — IODINE STRONG (LUGOLS) 5 % PO SOLN
ORAL | Status: DC | PRN
  Administered 2018-12-13: 0.1 mL

## 2018-12-13 MED ORDER — FERRIC SUBSULFATE SOLN
Status: DC | PRN
  Administered 2018-12-13: 1

## 2018-12-13 MED ORDER — LIDOCAINE 2% (20 MG/ML) 5 ML SYRINGE
INTRAMUSCULAR | Status: AC
  Filled 2018-12-13: qty 5

## 2018-12-13 MED ORDER — MIDAZOLAM HCL 2 MG/2ML IJ SOLN
INTRAMUSCULAR | Status: DC | PRN
  Administered 2018-12-13: 2 mg via INTRAVENOUS

## 2018-12-13 MED ORDER — ONDANSETRON HCL 4 MG/2ML IJ SOLN
INTRAMUSCULAR | Status: AC
  Filled 2018-12-13: qty 4

## 2018-12-13 SURGICAL SUPPLY — 32 items
APL SWBSTK 6 STRL LF DISP (MISCELLANEOUS) ×2
APPLICATOR COTTON TIP 6 STRL (MISCELLANEOUS) ×2 IMPLANT
APPLICATOR COTTON TIP 6IN STRL (MISCELLANEOUS) ×3
BLADE SURG 11 STRL SS (BLADE) ×3 IMPLANT
CATH ROBINSON RED A/P 16FR (CATHETERS) ×3 IMPLANT
ELECT BALL LEEP 3MM BLK (ELECTRODE) ×2 IMPLANT
ELECT BALL LEEP 5MM RED (ELECTRODE) IMPLANT
ELECT LOOP LEEP RND 15X12 GRN (CUTTING LOOP) ×3
ELECT LOOP LEEP RND 20X12 WHT (CUTTING LOOP)
ELECT REM PT RETURN 9FT ADLT (ELECTROSURGICAL) ×3
ELECTRODE LOOP LP RND 15X12GRN (CUTTING LOOP) ×1 IMPLANT
ELECTRODE LOOP LP RND 20X12WHT (CUTTING LOOP) IMPLANT
ELECTRODE REM PT RTRN 9FT ADLT (ELECTROSURGICAL) ×2 IMPLANT
EXTENDER ELECT LOOP LEEP 10CM (CUTTING LOOP) IMPLANT
GLOVE BIOGEL PI IND STRL 7.0 (GLOVE) ×4 IMPLANT
GLOVE BIOGEL PI INDICATOR 7.0 (GLOVE) ×2
GLOVE ECLIPSE 6.5 STRL STRAW (GLOVE) ×6 IMPLANT
GOWN STRL REUS W/ TWL LRG LVL3 (GOWN DISPOSABLE) ×4 IMPLANT
GOWN STRL REUS W/TWL LRG LVL3 (GOWN DISPOSABLE) ×6
NS IRRIG 1000ML POUR BTL (IV SOLUTION) ×3 IMPLANT
PACK VAGINAL MINOR WOMEN LF (CUSTOM PROCEDURE TRAY) ×3 IMPLANT
PAD OB MATERNITY 4.3X12.25 (PERSONAL CARE ITEMS) ×3 IMPLANT
PENCIL BUTTON HOLSTER BLD 10FT (ELECTRODE) IMPLANT
PENCIL SMOKE EVACUATOR (MISCELLANEOUS) ×3 IMPLANT
SCOPETTES 8  STERILE (MISCELLANEOUS) ×2
SCOPETTES 8 STERILE (MISCELLANEOUS) ×4 IMPLANT
SLEEVE SUCTION 125 (MISCELLANEOUS) ×2 IMPLANT
SPONGE SURGIFOAM ABS GEL 12-7 (HEMOSTASIS) IMPLANT
SUT SILK 2 0 FSL 18 (SUTURE) ×3 IMPLANT
SUT VIC AB 0 CT1 27 (SUTURE) ×6
SUT VIC AB 0 CT1 27XBRD ANBCTR (SUTURE) ×4 IMPLANT
TOWEL GREEN STERILE FF (TOWEL DISPOSABLE) ×6 IMPLANT

## 2018-12-13 NOTE — Op Note (Addendum)
12/13/2018  11:56 AM  PATIENT:  Alicia Wilkerson  68 y.o. female  PRE-OPERATIVE DIAGNOSIS:  CIN 2/3  POST-OPERATIVE DIAGNOSIS:  CIN 2/3  PROCEDURE:  Colposcopy with Loop electrocautery excision Procedure (LEEP) with endocervical curettage   SURGEON:  Megan Salon  ASSISTANTS: OR staff   ANESTHESIA:   MAC  ESTIMATED BLOOD LOSS: 0 mL  BLOOD ADMINISTERED:none   FLUIDS: 500cc LR  UOP: none.  Pt voided before going to OR.  SPECIMEN:  Cervical LEEP and ECC.  Black suture on 6 o'clock portion of specimen and white suture on 12 o'clock portion of specimen  DISPOSITION OF SPECIMEN:  PATHOLOGY  FINDINGS: normal appearing cervix with with AWE after application of acetic acid and no abnormal staining with Lugol's solution  DESCRIPTION OF OPERATION:  Patient was taken to the operating room.  She is placed in the supine position. SCDs were on her lower extremities and functioning properly. LMA anesthesia was administered without difficulty. Dr. Jillyn Hidden, anesthesia, oversaw case.  Legs were then placed in the [place] in the low lithotomy position. The legs were lifted to the high lithotomy position.  Speculum placed with good visualization of the cervix.  Visualization was much improved over in the office (which was one of the reasons this was done in the OR).  Colposcopy performed showing no AWE with application of acetic acid.  Lugols solution was also applied and not abnormal staning was noted.  Cervix anesthetized using 1% Xylocaine with 1:100,000units Epinephrine.  10 cc's used.  Entire transition zone excised with 10 x 57m loop in 3 passes.  Specimens labeled with suture and sent to pathology.  Hemostasis obtained with ball cautery and Monsel's solution.  Patient tolerated procedure well.  Sponge, lap, needle and instrument counts were correct x 2.  Pt was awakened from anesthesia and taken to the recovery room in stable condition.    COUNTS:  YES  PLAN OF CARE: Transfer to  PACU

## 2018-12-13 NOTE — Transfer of Care (Signed)
Immediate Anesthesia Transfer of Care Note  Patient: Alicia Wilkerson  Procedure(s) Performed: possible LOOP ELECTROSURGICAL EXCISION PROCEDURE (LEEP) (N/A Vagina ) CONIZATION CERVIX WITH BIOPSY (N/A Vagina )  Patient Location: PACU  Anesthesia Type:MAC  Level of Consciousness: drowsy and patient cooperative  Airway & Oxygen Therapy: Patient Spontanous Breathing  Post-op Assessment: Report given to RN, Post -op Vital signs reviewed and stable and Patient moving all extremities X 4  Post vital signs: Reviewed and stable  Last Vitals:  Vitals Value Taken Time  BP 127/64 12/13/18 1200  Temp    Pulse 73 12/13/18 1200  Resp 20 12/13/18 1200  SpO2 98 % 12/13/18 1200  Vitals shown include unvalidated device data.  Last Pain:  Vitals:   12/13/18 1055  TempSrc:   PainSc: 0-No pain         Complications: No apparent anesthesia complications

## 2018-12-13 NOTE — H&P (Signed)
Alicia Wilkerson is an 68 y.o. female G53P2 WWF with h/o recurrent cervical dysplasia.  Pt has hx of LEEP with CIN 2/3 and negative margins in 10/2015.  Most recent pap smear was ASCUS with +HR HPV.  Her ECC showed CIN 2.  Repeat LEEP was recommended.  Due to size of cervix, I felt it was safer to perform this in the OR.  She was scheduled about 10 days ago but procedure was cancelled due to lack of available care post op for her.  Anesthesia recommended cancelling case due to hospital policy.    This was rescheduled and she is now here and ready to proceed.  Risks including bleeding, persistent dysplasia, infection and need for future procedures have been discussed.  Rare risk of bowel, bladder, ureteral injury discussed.  All questions answered.    Pertinent Gynecological History: Menses: post-menopausal Bleeding: none Contraception: post menopausal status DES exposure: denies Blood transfusions: none Sexually transmitted diseases: +high risk HPV Previous GYN Procedures: in office LEEP  Last mammogram: normal Date: 03/09/2018 Last pap: normal Date: ASCUS with +HR HPV OB History: G2 P2  Menstrual History: Patient's last menstrual period was 02/24/2002.    Past Medical History:  Diagnosis Date  . Anemia   . Anxiety disorder   . Basal cell carcinoma (BCC) of left upper arm 12/2015  . Basal cell carcinoma (BCC) of right lower leg 04/2016  . CIN II (cervical intraepithelial neoplasia II)    CIN  1  . Eczema   . Family history of premature CAD   . History of basal cell carcinoma (BCC) excision    left upper arm 2017;   right lower leg 2018;  bilateral lower leg and hip area 2019;   inner right lower leg, outer right thigh, & upper outside left arm 09/ 2020  . History of cervical dysplasia 2013  . History of hereditary spherocytosis    s/p splenectomy in 1954  . History of hyperparathyroidism    per pt yrs ago was put on mega dose of vit d which caused the hyperparathyroid resolved after  stopping vit d  . History of pelvic fracture 2013  . Osteoporosis   . PONV (postoperative nausea and vomiting)   . S/P splenectomy    spherocytosis  . Wears glasses   . White coat syndrome without diagnosis of hypertension     Past Surgical History:  Procedure Laterality Date  . ANKLE SURGERY  03/14/2015   . FRACTURE SURGERY    . KNEE SURGERY Left 1991   per pt retained hardward  . ORIF ANKLE FRACTURE Left 03/14/2015   per pt retained hardware  . SPLENECTOMY, TOTAL  1954   due to spherocytosis  . TUBAL LIGATION Bilateral 1995    Family History  Problem Relation Age of Onset  . Heart failure Father   . Skin cancer Father   . Hypertension Father   . Heart disease Father   . Osteoporosis Mother   . Colon cancer Mother        deceased  . Heart disease Sister   . Crohn's disease Daughter     Social History:  reports that she has never smoked. She has never used smokeless tobacco. She reports current alcohol use. She reports that she does not use drugs.  Allergies:  Allergies  Allergen Reactions  . Penicillins Rash    Did it involve swelling of the face/tongue/throat, SOB, or low BP? No Did it involve sudden or severe rash/hives, skin peeling, or  any reaction on the inside of your mouth or nose?  #  #  #  YES  #  #  #  Did you need to seek medical attention at a hospital or doctor's office? #  #  #  YES  #  #  #  When did it last happen?years ago If all above answers are "NO", may proceed with cephalosporin use.   . Sulfa Antibiotics Hives    Medications Prior to Admission  Medication Sig Dispense Refill Last Dose  . clonazePAM (KLONOPIN) 0.5 MG tablet Take 1 tablet (0.5 mg total) by mouth daily as needed for anxiety. 30 tablet 2 12/13/2018 at 0830  . Dermatological Products, Misc. (NEOSALUS) CREA Apply 1 application topically daily as needed. Reported on 03/06/2015 100 g 1 More than a month at Unknown time  . halobetasol (ULTRAVATE) 0.05 % ointment APPLY TO  AFFECTED AREA TWICE DAILY (Patient taking differently: Apply 1 application topically 2 (two) times daily as needed (irritation). ) 50 g 0 More than a month at Unknown time    Review of Systems  All other systems reviewed and are negative.   Blood pressure (!) 160/85, pulse 68, temperature 97.8 F (36.6 C), temperature source Oral, resp. rate 18, height 5' 3.5" (1.613 m), weight 68 kg, last menstrual period 02/24/2002, SpO2 100 %. Physical Exam  Constitutional: She is oriented to person, place, and time. She appears well-developed and well-nourished.  Cardiovascular: Normal rate and regular rhythm.  Respiratory: Effort normal and breath sounds normal.  Neurological: She is alert and oriented to person, place, and time.  Skin: Skin is warm and dry.  Psychiatric: She has a normal mood and affect.    No results found for this or any previous visit (from the past 24 hour(s)).  No results found.  Assessment/Plan: 68 yo G2P2 MWF with hx of recurrent cervical dysplasia, +HR HPV and small cervix here for colposcopy, LEEP or possible CKC for treatment of cervical dysplasia.  Questions answered.  Pt ready to proceed.  Megan Salon 12/13/2018, 10:59 AM

## 2018-12-13 NOTE — Anesthesia Postprocedure Evaluation (Signed)
Anesthesia Post Note  Patient: Alicia Wilkerson  Procedure(s) Performed: possible LOOP ELECTROSURGICAL EXCISION PROCEDURE (LEEP) (N/A Vagina ) CONIZATION CERVIX WITH BIOPSY (N/A Vagina )     Patient location during evaluation: PACU Anesthesia Type: MAC Level of consciousness: awake Pain management: pain level controlled Vital Signs Assessment: post-procedure vital signs reviewed and stable Respiratory status: spontaneous breathing Cardiovascular status: stable Postop Assessment: no apparent nausea or vomiting Anesthetic complications: no    Last Vitals:  Vitals:   12/13/18 1037 12/13/18 1200  BP: (!) 160/85 127/64  Pulse: 68 69  Resp: 18 19  Temp: 36.6 C 36.5 C  SpO2: 100% 98%    Last Pain:  Vitals:   12/13/18 1200  TempSrc:   PainSc: 0-No pain   Pain Goal:                   Huston Foley

## 2018-12-13 NOTE — Discharge Instructions (Addendum)
Post-surgical Instructions, Outpatient Surgery  You may expect to feel dizzy, weak, and drowsy for as long as 24 hours after receiving the medicine that made you sleep (anesthetic). For the first 24 hours after your surgery:    Do not drive a car, ride a bicycle, participate in physical activities, or take public transportation until you are done taking narcotic pain medicines or as directed by Dr. Sabra Heck.   Do not drink alcohol or take tranquilizers.   Do not take medicine that has not been prescribed by your physicians.   Do not sign important papers or make important decisions while on narcotic pain medicines.   Have a responsible person with you.   PAIN MANAGEMENT  Motrin 800mg .  (This is the same as 4-200mg  over the counter tablets of Motrin or ibuprofen.)  You may take this every eight hours or as needed for cramping.    DO'S AND DON'T'S  Do not take a tub bath for one week.  You may shower on the first day after your surgery  Do not do any heavy lifting for one to two weeks.  This increases the chance of bleeding.  Do move around as you feel able.  Stairs are fine.  You may begin to exercise again as you feel able.  Do not lift any weights for two weeks.  Do not put anything in the vagina for 4 weeks--no tampons, intercourse, or douching.    REGULAR MEDIATIONS/VITAMINS:  You may restart all of your regular medications as prescribed.  You may restart all of your vitamins as you normally take them.    PLEASE CALL OR SEEK MEDICAL CARE IF:  You have persistent nausea and vomiting.   You have trouble eating or drinking.   You have an oral temperature above 100.5.   You have constipation that is not helped by adjusting diet or increasing fluid intake. Pain medicines are a common cause of constipation.   You have heavy vaginal bleeding

## 2018-12-13 NOTE — Anesthesia Procedure Notes (Signed)
Procedure Name: MAC Date/Time: 12/13/2018 11:25 AM Performed by: Larene Beach, CRNA Pre-anesthesia Checklist: Patient identified, Emergency Drugs available, Suction available and Patient being monitored Patient Re-evaluated:Patient Re-evaluated prior to induction Oxygen Delivery Method: Simple face mask Preoxygenation: Pre-oxygenation with 100% oxygen

## 2018-12-14 ENCOUNTER — Encounter (HOSPITAL_COMMUNITY): Payer: Self-pay | Admitting: Obstetrics & Gynecology

## 2018-12-16 LAB — SURGICAL PATHOLOGY

## 2018-12-20 ENCOUNTER — Telehealth (HOSPITAL_COMMUNITY): Payer: Self-pay | Admitting: Emergency Medicine

## 2018-12-20 DIAGNOSIS — C4491 Basal cell carcinoma of skin, unspecified: Secondary | ICD-10-CM | POA: Diagnosis not present

## 2018-12-20 NOTE — Telephone Encounter (Signed)
Reaching out to patient to offer assistance regarding upcoming cardiac imaging study; pt verbalizes understanding of appt date/time, parking situation and where to check in, pre-test NPO status and medications ordered, and verified current allergies; name and call back number provided for further questions should they arise Avaneesh Pepitone RN Navigator Cardiac Imaging [facility] [phone] office [phone] cell 

## 2018-12-20 NOTE — Telephone Encounter (Signed)
Left message on voicemail with name and callback number [name] RN Navigator Cardiac Imaging [facility] [phone] Office [phone] Cell  

## 2018-12-22 ENCOUNTER — Encounter (HOSPITAL_COMMUNITY): Payer: Self-pay

## 2018-12-22 ENCOUNTER — Other Ambulatory Visit: Payer: Self-pay

## 2018-12-22 ENCOUNTER — Ambulatory Visit (HOSPITAL_COMMUNITY)
Admission: RE | Admit: 2018-12-22 | Discharge: 2018-12-22 | Disposition: A | Discharge status: Home / Self Care | Payer: Medicare HMO | Source: Ambulatory Visit | Attending: Cardiology | Admitting: Cardiology

## 2018-12-22 DIAGNOSIS — R079 Chest pain, unspecified: Secondary | ICD-10-CM | POA: Diagnosis present

## 2018-12-22 DIAGNOSIS — R072 Precordial pain: Secondary | ICD-10-CM | POA: Diagnosis not present

## 2018-12-22 DIAGNOSIS — R0600 Dyspnea, unspecified: Secondary | ICD-10-CM | POA: Diagnosis present

## 2018-12-22 DIAGNOSIS — Z8249 Family history of ischemic heart disease and other diseases of the circulatory system: Secondary | ICD-10-CM | POA: Diagnosis present

## 2018-12-22 DIAGNOSIS — E785 Hyperlipidemia, unspecified: Secondary | ICD-10-CM | POA: Diagnosis present

## 2018-12-22 DIAGNOSIS — R0609 Other forms of dyspnea: Secondary | ICD-10-CM

## 2018-12-23 ENCOUNTER — Telehealth: Payer: Self-pay | Admitting: Cardiology

## 2018-12-23 NOTE — Telephone Encounter (Signed)
Spoke with the pt, she is ready to proceed with rescheduling her Coronary CT, but would like IV team or IR to be involved, when accessing her IV.  Informed the pt that I will make Mack Guise CT Scheduler and our Nurse Navigator aware she is ready to proceed with rescheduling, only if IR is involved.  Pt did state she would like to have this done in a week or on 11/9, to allow her body to rest.  Pt verbalized understanding and agrees with this plan.

## 2018-12-23 NOTE — Telephone Encounter (Signed)
Patient [name] was cancel due they couldn't get a IV started.   IR was called and they still couldn't get a line in.   Alicia Wilkerson was notified that CT was cancel and patient would have to rescheduled.  Message was sent to me from Alicia Wilkerson that IR will need to be sent up when patient is reschedule for CT.   Spoke with patient this morning and she want to discuss what happen on yesterday.   She stated she was black and blue with swelling.   She also need to discuss other test she has to do.   She don't want to reschedule until she talk with Dr Meda Coffee or the nurse.

## 2018-12-24 ENCOUNTER — Encounter: Payer: Self-pay | Admitting: *Deleted

## 2018-12-24 ENCOUNTER — Telehealth: Payer: Self-pay | Admitting: *Deleted

## 2018-12-24 ENCOUNTER — Other Ambulatory Visit (HOSPITAL_COMMUNITY): Payer: Self-pay | Admitting: Cardiology

## 2018-12-24 DIAGNOSIS — I878 Other specified disorders of veins: Secondary | ICD-10-CM

## 2018-12-24 MED ORDER — METOPROLOL TARTRATE 100 MG PO TABS: 100.0000 mg | ORAL_TABLET | Freq: Once | ORAL | 0 refills | Status: DC

## 2018-12-24 NOTE — Telephone Encounter (Signed)
CT  Received: Today Message Contents  Jerlyn Ly, LPN        Schedule for 01-05-19 .  She stated she needed a rx for metoprolol sent to drug store.      Metoprolol tartrate 100 mg tablet with instructions to take 1 tab (100 mg total) by mouth once for 1 dose, take 2 hours prior to scheduled Coronary CT, was called back into the pts pharmacy of choice.  Also re-sent her Coronary CT instructions to her mychart account for further reference in preparation of upcoming appt.

## 2019-01-03 ENCOUNTER — Telehealth (HOSPITAL_COMMUNITY): Payer: Self-pay | Admitting: Emergency Medicine

## 2019-01-03 NOTE — Telephone Encounter (Signed)
Left message on voicemail with name and callback number Tylisha Danis RN Navigator Cardiac Imaging [facility] [phone] Office [phone] Cell  

## 2019-01-05 ENCOUNTER — Other Ambulatory Visit (HOSPITAL_COMMUNITY): Payer: Self-pay | Admitting: Cardiology

## 2019-01-05 ENCOUNTER — Other Ambulatory Visit: Payer: Self-pay

## 2019-01-05 ENCOUNTER — Ambulatory Visit (HOSPITAL_COMMUNITY)
Admission: RE | Admit: 2019-01-05 | Discharge: 2019-01-05 | Disposition: A | Discharge status: Home / Self Care | Payer: Medicare HMO | Source: Ambulatory Visit | Attending: Cardiology | Admitting: Cardiology

## 2019-01-05 ENCOUNTER — Encounter (HOSPITAL_COMMUNITY): Payer: Self-pay

## 2019-01-05 DIAGNOSIS — R072 Precordial pain: Secondary | ICD-10-CM | POA: Insufficient documentation

## 2019-01-05 DIAGNOSIS — R079 Chest pain, unspecified: Secondary | ICD-10-CM | POA: Diagnosis present

## 2019-01-05 DIAGNOSIS — Z8249 Family history of ischemic heart disease and other diseases of the circulatory system: Secondary | ICD-10-CM | POA: Insufficient documentation

## 2019-01-05 DIAGNOSIS — I872 Venous insufficiency (chronic) (peripheral): Secondary | ICD-10-CM | POA: Diagnosis not present

## 2019-01-05 DIAGNOSIS — R0600 Dyspnea, unspecified: Secondary | ICD-10-CM | POA: Insufficient documentation

## 2019-01-05 DIAGNOSIS — E785 Hyperlipidemia, unspecified: Secondary | ICD-10-CM | POA: Diagnosis present

## 2019-01-05 DIAGNOSIS — I878 Other specified disorders of veins: Secondary | ICD-10-CM | POA: Insufficient documentation

## 2019-01-05 HISTORY — PX: IR RADIOLOGY PERIPHERAL GUIDED IV START: IMG5598

## 2019-01-05 HISTORY — PX: IR US GUIDE VASC ACCESS RIGHT: IMG2390

## 2019-01-05 MED ORDER — NITROGLYCERIN 0.4 MG SL SUBL
SUBLINGUAL_TABLET | SUBLINGUAL | Status: AC
  Administered 2019-01-05: 0.8 mg via SUBLINGUAL
  Filled 2019-01-05: qty 2

## 2019-01-05 MED ORDER — NITROGLYCERIN 0.4 MG SL SUBL
0.8000 mg | SUBLINGUAL_TABLET | Freq: Once | SUBLINGUAL | Status: AC
  Administered 2019-01-05: 13:00:00 0.8 mg via SUBLINGUAL

## 2019-01-05 MED ORDER — IOHEXOL 350 MG/ML SOLN
80.0000 mL | Freq: Once | INTRAVENOUS | Status: AC | PRN
  Administered 2019-01-05: 80 mL via INTRAVENOUS

## 2019-01-05 MED ORDER — LIDOCAINE HCL 1 % IJ SOLN
INTRAMUSCULAR | Status: AC
  Filled 2019-01-05: qty 20

## 2019-01-05 NOTE — Procedures (Signed)
PROCEDURE SUMMARY:  Successful placement of image-guided IV start to the right brachial vein. No complications. EBL = 0 mL. IV flushes/aspirates easily with NS, dressed appropriately. Ready for use.  Please see imaging section of Epic for full dictation.  Joaquim Nam PA-C 01/05/2019 12:37 PM

## 2019-01-06 DIAGNOSIS — R072 Precordial pain: Secondary | ICD-10-CM | POA: Diagnosis not present

## 2019-01-07 ENCOUNTER — Encounter: Payer: Self-pay | Admitting: Obstetrics & Gynecology

## 2019-01-07 ENCOUNTER — Telehealth: Payer: Self-pay | Admitting: *Deleted

## 2019-01-07 ENCOUNTER — Other Ambulatory Visit: Payer: Self-pay

## 2019-01-07 ENCOUNTER — Encounter: Payer: Self-pay | Admitting: Internal Medicine

## 2019-01-07 ENCOUNTER — Ambulatory Visit (INDEPENDENT_AMBULATORY_CARE_PROVIDER_SITE_OTHER): Payer: Medicare HMO | Admitting: Obstetrics & Gynecology

## 2019-01-07 VITALS — BP 128/76 | HR 76 | Temp 97.3°F | Ht 63.25 in | Wt 146.4 lb

## 2019-01-07 DIAGNOSIS — Z9889 Other specified postprocedural states: Secondary | ICD-10-CM

## 2019-01-07 MED ORDER — ROSUVASTATIN CALCIUM 20 MG PO TABS: 20.0000 mg | ORAL_TABLET | Freq: Every day | ORAL | 1 refills | Status: DC

## 2019-01-07 MED ORDER — ASPIRIN EC 81 MG PO TBEC: 81.0000 mg | DELAYED_RELEASE_TABLET | Freq: Every day | ORAL | 3 refills | Status: DC

## 2019-01-07 NOTE — Progress Notes (Signed)
Post Operative Visit  Procedure:possible LOOP ELECTROSURGICAL EXCISION PROCEDURE (LEEP) (N/A Vagina ) CONIZATION CERVIX WITH BIOPSY (N/A Vagina ) Days Post-op: 2 week post op  Subjective: Patient states that she is feeling well.  Denies vaginal bleeding.    She is worried about her Coronary CT results with score of 544.  She thinks statin and ASA is likely going to be recommended.  Has follow up with cardiology next week.    Pathology reviewed with pt.  Atrophy noted only.  ECC was negative.  D/w pt repeating pap smear in six months and using something for vaginal moisture prior to pap.  Estrogen cream discussed but with upcoming cardiology appt, possibly Vit E would be better.    Objective: BP 128/76 (BP Location: Right Arm, Patient Position: Sitting, Cuff Size: Normal)   Pulse 76   Temp (!) 97.3 F (36.3 C) (Temporal)   Ht 5' 3.25" (1.607 m)   Wt 146 lb 6.4 oz (66.4 kg)   LMP 02/24/2002   BMI 25.73 kg/m   EXAM Physical Exam  Constitutional: She appears well-developed and well-nourished.  Genitourinary:    Vagina normal.  There is no rash, tenderness, lesion or injury on the right labia. There is no rash, tenderness, lesion or injury on the left labia. Cervix exhibits no motion tenderness, no discharge and no friability.    Genitourinary Comments: Cervix s/p LEEP procedure that is healing appropriately without discharge or bleeding, scant blood noted with manipulation of cervix with scopette   Lymphadenopathy:       Right: No inguinal adenopathy present.       Left: No inguinal adenopathy present.  Skin: Skin is warm and dry.  Psychiatric: She has a normal mood and affect.   Assessment: s/p LEEP for CIN 2 but final pathology from LEEP with atrophy only and negative ECC  Plan: Repeat pap in 6 months and then HR HPV/pap 1 year Using vaginal estrogen cream or vit E vaginal cream prior to pap.

## 2019-01-07 NOTE — Telephone Encounter (Signed)
That's ok.

## 2019-01-07 NOTE — Telephone Encounter (Signed)
-----   Message from Dorothy Spark, MD sent at 01/06/2019  8:56 PM EST ----- She has moderate disease in proximal to mid LAD that requires good lipid management, I would start her on rosuvastatin 20 mg daily and aspirin 81 mg daily.  In addition her lung CT shows The appearance of the lungs suggests chronic indolent atypical infectious process such as mycobacterium avium intracellulare (MAI). Outpatient referral to Pulmonology for further evaluation is suggested.

## 2019-01-07 NOTE — Telephone Encounter (Signed)
Spoke with the pt and informed her, of her Coronary CT results and recommendations per Dr Meda Coffee.  Confirmed the pharmacy of choice with the pt. Pt is aware to start Rosuvastatin 20 mg po daily and ASA 81 mg po daily. Pt states she would like to inform Dr. Meda Coffee that she would like to hold off on being referred to Pulmonology at this time, and will revisit this recommendation at a later time.  Pt states she will be mindful of any lung signs and symptoms, and has read up on MAI a lot.  Pt states she would like to hold off on this referral because she has been to so many Doctors for so many different health issues, and  she is dealing with a lot of anxiety and going through several med changes for this.  Pt states with all her procedures and health issues she's had, along with the stress of Covid and the Election, she thinks that being referred to another MD at this time, will send her anxiety "through the roof." Pt states she will wait on this referral and call us back at a later time to have the referral placed.  Pt states she will keep her appt with Melina Copa PA-C for 11/30 in the office. Informed the pt that I will make Dr Meda Coffee aware of this, and only follow-up with her, if she has further recommendations concerning this.  Pt verbalized understanding and agrees with this plan.

## 2019-01-09 MED ORDER — ERYTHROMYCIN 5 MG/GM OP OINT: 1.0000 "application " | TOPICAL_OINTMENT | Freq: Every day | OPHTHALMIC | 0 refills | Status: DC

## 2019-01-10 ENCOUNTER — Encounter: Payer: Self-pay | Admitting: Obstetrics & Gynecology

## 2019-01-10 ENCOUNTER — Telehealth: Payer: Self-pay | Admitting: Obstetrics & Gynecology

## 2019-01-10 NOTE — Telephone Encounter (Signed)
Patient sent the following message through [facility]. Routing to triage to assist patient with request.  Alicia Wilkerson, [facility]  Phone Number: [phone]        Dr. Meda Coffee has prescribed 20 mg Rosuvastatin taking one time a day by mouth along with 1 coated baby aspirin .81 mg one time per day. I wanted to make sure this will not interfere with my healing from the biopsy since you still found blood on Fri during my visit.   Plastic surgeon, Dr Harlow Mares, cancelled my outpatient Surgery for superficial Basel cells this Wednesday and will only do it in [place] w/MAC anesthesia & local (I was not starting statin nor aspirin until after Weds accordingly) even though Dr Meda Coffee had no problem clearing me for office surgery. I am trying to change back to Dr. Marla Roe who did office surgery for me May 2019 with no reservations when my BP was 157 and with no complications. I have a consult with her this Fri AM. Just trying to avoid more anesthesia, and hospital exposure & another Covid test.   I did get antibiotic ointment from Dr Quay Burow yesterday so thanks for helping me with that. Stye is improving.   Also spoke with Dr Quay Burow and she is fine with me starting lowest dosage of lexapro or Effexor in addition to my Klonopin. Am debating if I should hold off to ensure if I do have side effects from statin I can isolate which rx it is. I always value your input. Also, because my bloodwork has always been great, don't want to tax liver, kidneys or worry about my A1C. Dr. Meda Coffee agrees my BP is white coat syndrome vs heart issues.    Please advise on above.   Can't thank you enough for all of your care and hand holding.   So grateful to you for treating me holistically & greatly value our relationship.   [name]

## 2019-01-10 NOTE — Telephone Encounter (Signed)
Spoke with the pt and she is letting us know she is going to see a new Plastic Surgeon this Friday for consult, and will be having them fax our office a cardiac clearance note, for Dr. Meda Coffee and pre-op to advise on, prior to basal cell procedure.  Endorsed our fax contact information to provide to them.  Pt verbalized understanding and agrees with this plan.

## 2019-01-10 NOTE — Telephone Encounter (Signed)
Routing MyChart message to Dr. Miller to review.  °

## 2019-01-13 ENCOUNTER — Telehealth: Payer: Self-pay | Admitting: *Deleted

## 2019-01-13 NOTE — Telephone Encounter (Signed)
   [facility] Pre-operative Risk Assessment    Request for surgical clearance:  1. What type of surgery is being performed? EXCISION OF 3 BASAL CELL CARCINOMA LESIONS   2. When is this surgery scheduled? 01/27/19   3. What type of clearance is required (medical clearance vs. Pharmacy clearance to hold med vs. Both)? MEDICAL  4. Are there any medications that need to be held prior to surgery and how long? ASA   5. Practice name and name of physician performing surgery? [facility]; DR. DAVID BOWERS   6. What is your office phone number [phone]    7.   What is your office fax number [phone]  8.   Anesthesia type (None, local, MAC, general) ? MAC WITH LOCAL   Senna Lape 01/13/2019, 12:05 PM  _________________________________________________________________   (provider comments below)

## 2019-01-13 NOTE — Telephone Encounter (Signed)
Dr. Meda Coffee, patient was recently started on aspirin given moderate disease seen on coronary CT with negative FFR. I plan to take the patient off of aspirin for 5-7 days prior to excision of basal cell carcinoma and want to make sure you are ok with this.

## 2019-01-14 ENCOUNTER — Ambulatory Visit: Payer: Medicare HMO | Admitting: Obstetrics & Gynecology

## 2019-01-14 ENCOUNTER — Other Ambulatory Visit: Payer: Self-pay

## 2019-01-14 ENCOUNTER — Encounter: Payer: Self-pay | Admitting: Plastic Surgery

## 2019-01-14 ENCOUNTER — Ambulatory Visit: Payer: Medicare HMO | Admitting: Plastic Surgery

## 2019-01-14 VITALS — BP 159/89 | HR 83 | Temp 97.1°F | Ht 63.0 in | Wt 150.8 lb

## 2019-01-14 DIAGNOSIS — C44711 Basal cell carcinoma of skin of unspecified lower limb, including hip: Secondary | ICD-10-CM | POA: Diagnosis not present

## 2019-01-14 NOTE — Progress Notes (Signed)
Patient ID: Alicia Wilkerson, female    DOB: [birth year], 68 y.o.   MRN: [id number]   Chief Complaint  Patient presents with  . Advice Only    for removal of skin lesion    The patient is a 68 year old female here for evaluation of several skin lesions.  She was recently seen by the dermatologist and had biopsies done.  They all came back as basal cell carcinoma.  They involve the left upper arm, right lower leg and right upper thigh.  The biopsy sites are all healing well.  She does not want to use creams but would like to have them excised.  She has a very strong history of skin cancer.  She is otherwise doing well.  She has had bout with elevated blood pressure.  She is on medication for this and is doing much better.  She originally had this scheduled with a another surgeon but it was canceled due to her blood pressure.   Review of Systems  Constitutional: Negative.   HENT: Negative.   Eyes: Negative.   Respiratory: Negative.   Cardiovascular: Negative.   Gastrointestinal: Negative.   Genitourinary: Negative.   Musculoskeletal: Negative.   Skin: Negative.   Hematological: Negative.   Psychiatric/Behavioral: Negative.     Past Medical History:  Diagnosis Date  . Anemia   . Anxiety disorder   . Basal cell carcinoma (BCC) of left upper arm 12/2015  . Basal cell carcinoma (BCC) of right lower leg 04/2016  . CIN II (cervical intraepithelial neoplasia II)    CIN  1  . Eczema   . Family history of premature CAD   . History of basal cell carcinoma (BCC) excision    left upper arm 2017;   right lower leg 2018;  bilateral lower leg and hip area 2019;   inner right lower leg, outer right thigh, & upper outside left arm 09/ 2020  . History of cervical dysplasia 2013  . History of hereditary spherocytosis    s/p splenectomy in 1954  . History of hyperparathyroidism    per pt yrs ago was put on mega dose of vit d which caused the hyperparathyroid resolved after stopping vit d  .  History of pelvic fracture 2013  . Osteoporosis   . PONV (postoperative nausea and vomiting)   . S/P splenectomy    spherocytosis  . Wears glasses   . White coat syndrome without diagnosis of hypertension     Past Surgical History:  Procedure Laterality Date  . ANKLE SURGERY  03/14/2015   . CERVICAL CONIZATION W/BX N/A 12/13/2018   Procedure: CONIZATION CERVIX WITH BIOPSY;  Surgeon: Megan Salon, MD;  Location: [facility];  Service: Gynecology;  Laterality: N/A;  . FRACTURE SURGERY    . IR RADIOLOGY PERIPHERAL GUIDED IV START  01/05/2019  . IR US GUIDE VASC ACCESS RIGHT  01/05/2019  . KNEE SURGERY Left 1991   per pt retained hardward  . LEEP N/A 12/13/2018   Procedure: possible LOOP ELECTROSURGICAL EXCISION PROCEDURE (LEEP);  Surgeon: Megan Salon, MD;  Location: [facility];  Service: Gynecology;  Laterality: N/A;  . ORIF ANKLE FRACTURE Left 03/14/2015   per pt retained hardware  . SPLENECTOMY, TOTAL  1954   due to spherocytosis  . TUBAL LIGATION Bilateral 1995      Current Outpatient Medications:  .  aspirin EC 81 MG tablet, Take 1 tablet (81 mg total) by mouth daily., Disp: 90 tablet, Rfl: 3 .  clonazePAM (KLONOPIN) 0.5 MG tablet, Take 1 tablet (0.5 mg total) by mouth daily as needed for anxiety., Disp: 30 tablet, Rfl: 2 .  Dermatological Products, Misc. (NEOSALUS) CREA, Apply 1 application topically daily as needed. Reported on 03/06/2015, Disp: 100 g, Rfl: 1 .  erythromycin ophthalmic ointment, Place 1 application into the left eye at bedtime., Disp: 3.5 g, Rfl: 0 .  halobetasol (ULTRAVATE) 0.05 % ointment, APPLY TO AFFECTED AREA TWICE DAILY, Disp: 50 g, Rfl: 0 .  rosuvastatin (CRESTOR) 20 MG tablet, Take 1 tablet (20 mg total) by mouth daily., Disp: 90 tablet, Rfl: 1   Objective:   Vitals:   01/14/19 0817  BP: (!) 159/89  Pulse: 83  Temp: (!) 97.1 F (36.2 C)  SpO2: 97%    Physical Exam Vitals signs and nursing note reviewed.  Constitutional:      Appearance: Normal  appearance.  HENT:     Head: Atraumatic.  Eyes:     Extraocular Movements: Extraocular movements intact.  Neck:     Musculoskeletal: Normal range of motion.  Cardiovascular:     Rate and Rhythm: Normal rate.  Pulmonary:     Effort: Pulmonary effort is normal.  Abdominal:     General: Abdomen is flat.  Musculoskeletal:       Arms:       Legs:  Neurological:     General: No focal deficit present.     Mental Status: She is alert and oriented to person, place, and time.  Psychiatric:        Mood and Affect: Mood normal.        Behavior: Behavior normal.        Thought Content: Thought content normal.     Assessment & Plan:  Basal cell carcinoma (BCC) of skin of lower extremity including hip, unspecified laterality  Recommend excision of basal cell carcinoma left arm, right thigh and right leg.  We can do this in the clinic. Pictures were obtained of the patient and placed in the chart with the patient's or guardian's permission.   [place], DO

## 2019-01-15 ENCOUNTER — Encounter: Payer: Self-pay | Admitting: Obstetrics & Gynecology

## 2019-01-15 NOTE — Telephone Encounter (Signed)
Yes, its ok, thank you

## 2019-01-15 NOTE — Telephone Encounter (Signed)
Detailed mychart message sent to pt.  Ok to close encounter.

## 2019-01-17 NOTE — Telephone Encounter (Signed)
   Primary Cardiologist: Ena Dawley, MD  Chart reviewed as part of pre-operative protocol coverage. Patient was contacted 01/17/2019 in reference to pre-operative risk assessment for pending surgery as outlined below.  Alicia Wilkerson was last seen on 11/15/2018 by Dr. Meda Coffee.  She has a strong family history of cardiac disease and hypertension with findings of LVH on EKG and recently had a initial evaluation at our office by Dr. Meda Coffee.  Coronary CTA showed moderate proximal to mid LAD disease, no occlusive disease.  She was started on a statin and aspirin 81 mg.  She was also noted to have possible MAI noted on her CT (she has declined a pulmonary referral as she is breathing much better).  Since that day, Alicia Wilkerson has done very well with no further chest pain or shortness of breath.   Therefore, based on ACC/AHA guidelines, the patient would be at acceptable risk for the planned procedure without further cardiovascular testing.   Aspirin can be held for 5-7 days as needed for the procedure.  Request for clearance was received from [facility], Dr. Crissie Reese, however, the patient now states that she is working on actually having her procedure done by Dr. Audelia Hives of [facility].  She requests that clearance be sent there.   I will route this recommendation to Dr. Harlow Mares and Dr. Marla Roe via [facility] fax function and remove from pre-op pool.  Dr. Marla Roe fax [phone]  Please call with questions.  Daune Perch, NP 01/17/2019, 10:11 AM

## 2019-01-17 NOTE — Telephone Encounter (Signed)
Last read by [name] at 12:23 PM on 01/15/2019.  Encounter closed.

## 2019-01-23 ENCOUNTER — Encounter: Payer: Self-pay | Admitting: Physician Assistant

## 2019-01-23 NOTE — Progress Notes (Signed)
Cardiology Office Note    Date:  01/24/2019   ID:  Alicia Wilkerson, DOB 16-Mar-1950, MRN [id number]  PCP:  Binnie Rail, MD  Cardiologist:  Ena Dawley, MD  Electrophysiologist:  None   Chief Complaint: f/u cardiac testing and shortness of breath  History of Present Illness:   Alicia Wilkerson is a 68 y.o. female with history of longstanding anxiety, anemia, FH of early CAD (in her father, sister, uncle, aunt), secondary hyperparathyroidism (due to excess vitamin D), osteoporosis/osteopenia, spherocytosis s/p splenectomy, borderline HTN, mild hyperlipidemia, CAD by coronary CTA 12/2018, possible MAI by CT 12/2018 who presents to follow up cardiac testing.   She was recently seen in the office 10/2018 for dyspnea on exertion. At the visit her blood pressure was significantly elevated. Per Dr. Francesca Oman note she became significantly anxious at the suggestion of blood pressure medication. 2D echo showed EF 60-65%, no LVH, + elevated LVEDP, impaired relaxation, normal RV, normal PASP, mild TR. Coronary CTA showed calcium score of 544, 96 percentile for age and sex matched control. There was diffuse moderate to severe calcified plaque in the proximal to mid LAD with possibly severe stenosis in the mid LAD. FFR analysis of the LAD results were Proximal: 0.94, mid: 0.92, distal: 0.82, arguing against any hemodynamically significant stenosis. Chest overread indicated the appearance of the lungs suggested chronic indolent atypical infectious process such as mycobacterium avium intracellulare (MAI) and aortic atherosclerosis, so outpatient pulmonary referral was recommended. The patient wished to defer as she has felt too overwhelmed between the election, Covid and seeing various specialties. Based on study, it was recommended to start ASA 81mg  and Crestor 20mg  daily.  Last labs 11/2018 showed BNP 103, Hgb 14.2, K 4.2, Cr 0.75, glucose 104, 09/2018 TSH wnl, LDL 113, LFTs wnl.  She returns for follow-up  reporting she is feeling much better. She is very detail oriented. Interestingly she reports that her breathing improved significantly upon initiation of Crestor. She does report a little memory fatigue but indicates it is not significant enough for her to warrant changing her regimen. She again becomes anxious when discussing addition of blood pressure medication. She has had a longstanding struggle with BP fluctuations.    Past Medical History:  Diagnosis Date  . Anemia   . Anxiety disorder   . Basal cell carcinoma (BCC) of left upper arm 12/2015  . Basal cell carcinoma (BCC) of right lower leg 04/2016  . Borderline hypertension   . CAD (coronary artery disease)    a. nonobst by cor CT 12/2018.  Marland Kitchen CIN II (cervical intraepithelial neoplasia II)    CIN  1  . Eczema   . Family history of premature CAD   . History of basal cell carcinoma (BCC) excision    left upper arm 2017;   right lower leg 2018;  bilateral lower leg and hip area 2019;   inner right lower leg, outer right thigh, & upper outside left arm 09/ 2020  . History of cervical dysplasia 2013  . History of hereditary spherocytosis    s/p splenectomy in 1954  . History of hyperparathyroidism    per pt yrs ago was put on mega dose of vit d which caused the hyperparathyroid resolved after stopping vit d  . History of pelvic fracture 2013  . MAI (mycobacterium avium-intracellulare) ([place])    ? possibly by CT 12/2018  . Mild hyperlipidemia   . Osteoporosis   . PONV (postoperative nausea and vomiting)   . S/P  splenectomy    spherocytosis  . Wears glasses   . White coat syndrome without diagnosis of hypertension     Past Surgical History:  Procedure Laterality Date  . ANKLE SURGERY  03/14/2015   . CERVICAL CONIZATION W/BX N/A 12/13/2018   Procedure: CONIZATION CERVIX WITH BIOPSY;  Surgeon: Megan Salon, MD;  Location: [facility];  Service: Gynecology;  Laterality: N/A;  . FRACTURE SURGERY    . IR RADIOLOGY PERIPHERAL GUIDED IV  START  01/05/2019  . IR US GUIDE VASC ACCESS RIGHT  01/05/2019  . KNEE SURGERY Left 1991   per pt retained hardward  . LEEP N/A 12/13/2018   Procedure: possible LOOP ELECTROSURGICAL EXCISION PROCEDURE (LEEP);  Surgeon: Megan Salon, MD;  Location: [facility];  Service: Gynecology;  Laterality: N/A;  . ORIF ANKLE FRACTURE Left 03/14/2015   per pt retained hardware  . SPLENECTOMY, TOTAL  1954   due to spherocytosis  . TUBAL LIGATION Bilateral 1995    Current Medications: Current Meds  Medication Sig  . aspirin EC 81 MG tablet Take 1 tablet (81 mg total) by mouth daily.  . clonazePAM (KLONOPIN) 0.5 MG tablet Take 1 tablet (0.5 mg total) by mouth daily as needed for anxiety.  . Dermatological Products, Misc. (NEOSALUS) CREA Apply 1 application topically daily as needed. Reported on 03/06/2015  . erythromycin ophthalmic ointment Place 1 application into the left eye at bedtime.  . halobetasol (ULTRAVATE) 0.05 % cream Apply topically as needed.  . rosuvastatin (CRESTOR) 20 MG tablet Take 1 tablet (20 mg total) by mouth daily.     Allergies:   Penicillins and Sulfa antibiotics   Social History   Socioeconomic History  . Marital status: Widowed    Spouse name: Not on file  . Number of children: Not on file  . Years of education: Not on file  . Highest education level: Not on file  Occupational History  . Not on file  Social Needs  . Financial resource strain: Not on file  . Food insecurity    Worry: Not on file    Inability: Not on file  . Transportation needs    Medical: Not on file    Non-medical: Not on file  Tobacco Use  . Smoking status: Never Smoker  . Smokeless tobacco: Never Used  Substance and Sexual Activity  . Alcohol use: Yes    Comment: very rare  . Drug use: No  . Sexual activity: Not Currently    Partners: Male    Birth control/protection: Surgical, Post-menopausal    Comment: BTL  Lifestyle  . Physical activity    Days per week: Not on file    Minutes per  session: Not on file  . Stress: Not on file  Relationships  . Social Herbalist on phone: Not on file    Gets together: Not on file    Attends religious service: Not on file    Active member of club or organization: Not on file    Attends meetings of clubs or organizations: Not on file    Relationship status: Not on file  Other Topics Concern  . Not on file  Social History Narrative  . Not on file     Family History:  The patient's family history includes Colon cancer in her mother; Crohn's disease in her daughter; Heart disease in her father and sister; Heart failure in her father; Hypertension in her father; Osteoporosis in her mother; Skin cancer in her father.  ROS:   Please see the history of present illness.   All other systems are reviewed and otherwise negative.    EKGs/Labs/Other Studies Reviewed:    Studies reviewed were summarized above.   Coronary CTA 01/05/19 ADDENDUM REPORT: 01/06/2019 20:42  EXAM: CT FFR ANALYSIS  CLINICAL DATA:  68 year old female with h/o HTN, HLP and dyspnea on exertion.  FINDINGS: FFRct analysis was performed on the original cardiac CT angiogram dataset. Diagrammatic representation of the FFRct analysis is provided in a separate PDF document in PACS. This dictation was created using the PDF document and an interactive 3D model of the results. 3D model is not available in the EMR/PACS. Normal FFR range is >0.80.  1. Left Main:  No significant stenosis.  2. LAD: Proximal: 0.94, mid: 0.92, distal: 0.82. 3. LCX: No significant stenosis. 4. RCA: No significant stenosis.  IMPRESSION: 1. CT FFR analysis didn't show any significant stenosis. Aggressive medical management is recommended.   Electronically Signed   By: Ena Dawley   On: 01/06/2019 20:42   Addended by Dorothy Spark, MD on 01/06/2019 8:45 PM    ADDENDUM REPORT: 01/06/2019 20:35  CLINICAL DATA:  68 year old female with h/o HTN, HLP  and dyspnea on exertion.  EXAM: Cardiac/Coronary  CTA  TECHNIQUE: The patient was scanned on a [employer].  FINDINGS: A 100 kV prospective scan was triggered in the descending thoracic aorta at 111 HU's. Axial non-contrast 3 mm slices were carried out through the heart. The data set was analyzed on a dedicated work station and scored using the [place]. Gantry rotation speed was 250 msecs and collimation was .6 mm. No beta blockade and 0.8 mg of sl NTG was given. The 3D data set was reconstructed in 5% intervals of the 67-82 % of the R-R cycle. Diastolic phases were analyzed on a dedicated work station using MPR, MIP and VRT modes. The patient received 80 cc of contrast.  Aorta: Normal size. Mild diffuse atherosclerotic plaque and calcifications. No dissection.  Aortic Valve:  Trileaflet.  Mild calcifications.  Coronary Arteries:  Normal coronary origin.  Right dominance.  RCA is a large dominant artery that gives rise to PDA and PLA. There is minimal calcified plaque in the ostium with stenosis 0-25%.  Left main is a large artery that gives rise to LAD and LCX arteries. Left main has minimal ostial plaque.  LAD is a large vessel that gives rise to one diagonal artery. LAD has diffuse moderate to severe calcified plaque in the proximal to mid portion, there are two moderate plaques with stenosis 50-69% followed by a focal stenosis that is possibly severe. Mid to distal LAD has minimal plaque.  D1 has minimal plaque.  LCX is a non-dominant artery that gives rise to one large OM1 branch. There is minimal plaque.  Other findings:  Normal pulmonary vein drainage into the left atrium.  Normal left atrial appendage without a thrombus.  Normal size of the pulmonary artery.  IMPRESSION: 1. Coronary calcium score of 544. This was 48 percentile for age and sex matched control.  2. Normal coronary origin with right dominance.  3.  Diffuse moderate to severe calcified plaque in the proximal to mid LAD with possibly severe stenosis in the mid LAD. Additional analysis with CT FFR will be submitted.   Electronically Signed   By: Ena Dawley   On: 01/06/2019 20:35   Addended by Dorothy Spark, MD on 01/06/2019 8:37 PM    Study Result  EXAM:  OVER-READ INTERPRETATION  CT CHEST  The following report is an over-read performed by radiologist Dr. Vinnie Langton of [facility], [place] on 01/05/2019. This over-read does not include interpretation of cardiac or coronary anatomy or pathology. The coronary calcium score/coronary CTA interpretation by the cardiologist is attached.  COMPARISON:  None.  FINDINGS: Aortic atherosclerosis. A few scattered calcified granulomas are noted. Within the visualized portions of the thorax there are no suspicious appearing pulmonary nodules or masses, there is no acute consolidative airspace disease, no pleural effusions, no pneumothorax and no lymphadenopathy. There are widespread areas of cylindrical and mild varicose bronchiectasis with associated thickening of the peribronchovascular interstitium, regional areas of architectural distortion and peribronchovascular micro nodularity most compatible with areas of chronic mucoid impaction within terminal bronchioles. Visualized portions of the upper abdomen are unremarkable. There are no aggressive appearing lytic or blastic lesions noted in the visualized portions of the skeleton.  IMPRESSION: 1. The appearance of the lungs suggests chronic indolent atypical infectious process such as mycobacterium avium intracellulare (MAI). Outpatient referral to Pulmonology for further evaluation is suggested. 2. Aortic atherosclerosis.  Electronically Signed: By: Vinnie Langton M.D. On: 01/05/2019 13:27     2D echo 11/24/18 IMPRESSIONS  1. Left ventricular ejection fraction, by visual estimation, is 60 to 65%.  The left ventricle has normal function. Normal left ventricular size. There is no left ventricular hypertrophy.  2. Elevated left ventricular end-diastolic pressure.  3. Left ventricular diastolic Doppler parameters are consistent with impaired relaxation pattern of LV diastolic filling.  4. Global right ventricle has normal systolic function.The right ventricular size is normal. No increase in right ventricular wall thickness.  5. Left atrial size was normal.  6. Right atrial size was normal.  7. The mitral valve is normal in structure. No evidence of mitral valve regurgitation. No evidence of mitral stenosis.  8. The tricuspid valve is normal in structure. Tricuspid valve regurgitation is mild.  9. The aortic valve was not well visualized Aortic valve regurgitation was not visualized by color flow Doppler. Structurally normal aortic valve, with no evidence of sclerosis or stenosis. 10. The pulmonic valve was normal in structure. Pulmonic valve regurgitation is not visualized by color flow Doppler. 11. Normal pulmonary artery systolic pressure. 12. The inferior vena cava is normal in size with greater than 50% respiratory variability, suggesting right atrial pressure of 3 mmHg.      EKG:  EKG was not ordered today.   Recent Labs: 10/20/2018: ALT 27; TSH 1.13 12/03/2018: B Natriuretic Peptide 103.4; BUN 19; Creatinine, Ser 0.75; Hemoglobin 14.2; Platelets 188; Potassium 4.2; Sodium 139  Recent Lipid Panel    Component Value Date/Time   CHOL 198 10/20/2018 1456   CHOL 204 (H) 08/21/2016 0939   TRIG 66.0 10/20/2018 1456   HDL 71.90 10/20/2018 1456   HDL 84 08/21/2016 0939   CHOLHDL 3 10/20/2018 1456   VLDL 13.2 10/20/2018 1456   LDLCALC 113 (H) 10/20/2018 1456   LDLCALC 110 (H) 08/21/2016 0939    PHYSICAL EXAM:    VS:  BP (!) 144/80   Pulse 70   Ht 5\' 3"  (1.6 m)   Wt 147 lb (66.7 kg)   LMP 02/24/2002   SpO2 99%   BMI 26.04 kg/m   BMI: Body mass index is 26.04 kg/m.  GEN:  Well nourished, well developed F in no acute distress HEENT: normocephalic, atraumatic Neck: no JVD, carotid bruits, or masses Cardiac: RRR; no murmurs, rubs, or gallops, no edema  Respiratory:  clear  to auscultation bilaterally, normal work of breathing GI: soft, nontender, nondistended, + BS MS: no deformity or atrophy Skin: warm and dry, no rash Neuro:  Alert and Oriented x 3, Strength and sensation are intact, follows commands Psych: euthymic mood, full affect  Wt Readings from Last 3 Encounters:  01/24/19 147 lb (66.7 kg)  01/14/19 150 lb 12.8 oz (68.4 kg)  01/07/19 146 lb 6.4 oz (66.4 kg)     ASSESSMENT & PLAN:   1. CAD - CT results reviewed at length today. Interestingly her symptoms improved significantly after addition of Crestor. Would continue present regimen and observe for any progressive anginal symptoms.  2. Borderline hypertension - Dr. Meda Coffee previously recommended medication for this and I agree with this assessment. The patient is not ready to consider this herself yet though. She said her PCP wants to put her on Lexapro as she feels triggered by her anxiety. Regardless though I think her BP needs to be well controlled with her CAD. She declines to start medication at this time. We did discuss that uncontrolled HTN will put her at risk for progression of CAD. She also has diastolic dysfunction on her echocardiogram. She did agree to check her BP at home for several days and relay home readings to [place]. She has a wrist cuff so will first purchase an arm cuff. If elevated, I would suggest trial of amlodipine for both BP and anti-anginal effect. 3. Hyperlipidemia - she reports she feels her muscles are less limber working together with Crestor but in general is still feeling significantly better than prior visits. Will check FLP/LFTs in 2-3 months. 4. Abnormal chest CT - discussed findings of possible MAI with patient. She has read up about this herself as well. At this time she  wishes to defer pulmonology evaluation due to feeling overwhelmed.  Disposition: F/u with Dr. Meda Coffee in 6 months.  Medication Adjustments/Labs and Tests Ordered: Current medicines are reviewed at length with the patient today.  Concerns regarding medicines are outlined above. Medication changes, Labs and Tests ordered today are summarized above and listed in the Patient Instructions accessible in Encounters.   Signed, Charlie Pitter, PA-C  01/24/2019 5:00 PM    [address], [place], [place]  [address] Phone: [phone]; Fax: [phone]

## 2019-01-24 ENCOUNTER — Encounter: Payer: Self-pay | Admitting: Physician Assistant

## 2019-01-24 ENCOUNTER — Other Ambulatory Visit: Payer: Self-pay

## 2019-01-24 ENCOUNTER — Ambulatory Visit: Payer: Medicare HMO | Admitting: Physician Assistant

## 2019-01-24 VITALS — BP 144/80 | HR 70 | Ht 63.0 in | Wt 147.0 lb

## 2019-01-24 DIAGNOSIS — R9389 Abnormal findings on diagnostic imaging of other specified body structures: Secondary | ICD-10-CM | POA: Diagnosis not present

## 2019-01-24 DIAGNOSIS — E785 Hyperlipidemia, unspecified: Secondary | ICD-10-CM | POA: Diagnosis not present

## 2019-01-24 DIAGNOSIS — R030 Elevated blood-pressure reading, without diagnosis of hypertension: Secondary | ICD-10-CM

## 2019-01-24 DIAGNOSIS — I2510 Atherosclerotic heart disease of native coronary artery without angina pectoris: Secondary | ICD-10-CM

## 2019-01-24 NOTE — Telephone Encounter (Signed)
Hi Alicia Wilkerson, I am not sure if this something we handle or what?

## 2019-01-24 NOTE — Patient Instructions (Addendum)
Medication Instructions:  Your physician recommends that you continue on your current medications as directed. Please refer to the Current Medication list given to you today.  *If you need a refill on your cardiac medications before your next appointment, please call your pharmacy*  Lab Work: 03/25/2019:  COME TO THE OFFICE FASTING, NOTHING TO EAT OR DRINK AFTER MIDNIGHT FOR LIPID/LFT  If you have labs (blood work) drawn today and your tests are completely normal, you will receive your results only by: Marland Kitchen MyChart Message (if you have MyChart) OR . A paper copy in the mail If you have any lab test that is abnormal or we need to change your treatment, we will call you to review the results.  Testing/Procedures: None ordered  Follow-Up: At [facility], you and your health needs are our priority.  As part of our continuing mission to provide you with exceptional heart care, we have created designated Provider Care Teams.  These Care Teams include your primary Cardiologist (physician) and Advanced Practice Providers (APPs -  Physician Assistants and Nurse Practitioners) who all work together to provide you with the care you need, when you need it.  Your next appointment:   6 month(s)  The format for your next appointment:   In Person  Provider:   You may see Ena Dawley, MD or one of the following Advanced Practice Providers on your designated Care Team:    Melina Copa, PA-C  Ermalinda Barrios, PA-C   Other Instructions  Please get a blood pressure cuff that goes on your arm. The wrist ones can be inaccurate. If possible, try to select one that also reports your heart rate. To check your blood pressure, choose a time about 3 hours after taking your blood pressure medicines. Remain seated in a chair for 5 minutes quietly beforehand, then check it. When you get a cuff, please get those readings and call us/send in MyChart message with them for our review.

## 2019-02-08 ENCOUNTER — Ambulatory Visit (INDEPENDENT_AMBULATORY_CARE_PROVIDER_SITE_OTHER): Payer: Medicare HMO | Admitting: Plastic Surgery

## 2019-02-08 ENCOUNTER — Other Ambulatory Visit: Payer: Self-pay

## 2019-02-08 ENCOUNTER — Other Ambulatory Visit (HOSPITAL_COMMUNITY)
Admission: RE | Admit: 2019-02-08 | Discharge: 2019-02-08 | Disposition: A | Discharge status: Home / Self Care | Payer: Medicare HMO | Source: Ambulatory Visit | Attending: Plastic Surgery | Admitting: Plastic Surgery

## 2019-02-08 ENCOUNTER — Other Ambulatory Visit: Payer: Self-pay | Admitting: Obstetrics & Gynecology

## 2019-02-08 VITALS — BP 169/96 | HR 80 | Temp 97.1°F

## 2019-02-08 DIAGNOSIS — C44619 Basal cell carcinoma of skin of left upper limb, including shoulder: Secondary | ICD-10-CM | POA: Diagnosis not present

## 2019-02-08 DIAGNOSIS — C44711 Basal cell carcinoma of skin of unspecified lower limb, including hip: Secondary | ICD-10-CM

## 2019-02-08 DIAGNOSIS — C44712 Basal cell carcinoma of skin of right lower limb, including hip: Secondary | ICD-10-CM | POA: Diagnosis not present

## 2019-02-08 DIAGNOSIS — C4491 Basal cell carcinoma of skin, unspecified: Secondary | ICD-10-CM | POA: Insufficient documentation

## 2019-02-08 NOTE — Progress Notes (Signed)
Procedure Note  Preoperative Dx:  1.  BCC right leg 2.  BCC right thigh 3.  BCC left arm  Postoperative Dx: Same  Procedure:  1. Excision of BCC right leg 1.5 cm 2. Excision of BCC right thigh 2 cm 3. Excision of BCC left arm 1.5 cm  Anesthesia: Lidocaine 1% with 1:100,000 epinepherine  Indication for Procedure: [facility]  Description of Procedure: Risks and complications were explained to the patient.  Consent was confirmed and the patient understands the risks and benefits.  The potential complications and alternatives were explained and the patient consents.  The patient expressed understanding the option of not having the procedure and the risks of a scar.  Time out was called and all information was confirmed to be correct.    Right leg:  The area was prepped and drapped.  Lidocaine 1% with epinepherine was injected in the subcutaneous area.  After waiting several minutes for the local to take affect a #15 blade was used to excise 1.5 cm the area in an eliptical pattern with 2 mm borders from the biopsy site.  A 5-0 Monocryl was used to close the skin edges with vertical mattress sutures.  A dressing was applied.    Right thigh: The area was prepped and drapped.  Lidocaine 1% with epinepherine was injected in the subcutaneous area.  After waiting several minutes for the local to take affect a #15 blade was used to excise the 2 cm area in an eliptical pattern with 2 mm borders from the biopsy site.  A 5-0 Monocryl was used to close the skin edges with vertical mattress sutures. A dressing was applied.   Left arm: The area was prepped and drapped.  Lidocaine 1% with epinepherine was injected in the subcutaneous area.  After waiting several minutes for the local to take affect a #15 blade was used to excise 1.5 cm the area in an eliptical pattern with 2 mm borders from the biopsy site.  A 5-0 Monocryl was used to close the skin edges with vertical mattress sutures.  A dressing was applied.    The patient was given instructions on how to care for the area and a follow up appointment.  Nautia tolerated the procedure well and there were no complications. The specimen was marked long stitch lateral and short superior then sent to pathology with the biopsy report from the referring physician.   The [place] was signed into law in 2016 which includes the topic of electronic health records.  This provides immediate access to information in MyChart.  This includes consultation notes, operative notes, office notes, lab results and pathology reports.  If you have any questions about what you read please let us know at your next visit or call us at the office.  We are right here with you.

## 2019-02-09 LAB — SURGICAL PATHOLOGY

## 2019-02-10 ENCOUNTER — Telehealth: Payer: Self-pay | Admitting: *Deleted

## 2019-02-10 NOTE — Telephone Encounter (Signed)
Per Dr. Merilyn Baba let patient know we got all the cancer no residual cancer.

## 2019-02-10 NOTE — Telephone Encounter (Signed)
Called and spoke with the patient and informed her of the message below regarding results.  Patient verbalized understanding and agreed.  She also stated that she saw the results on MyChart.//AB/CMA

## 2019-02-14 ENCOUNTER — Encounter: Payer: Self-pay | Admitting: Internal Medicine

## 2019-02-14 DIAGNOSIS — H0014 Chalazion left upper eyelid: Secondary | ICD-10-CM | POA: Diagnosis not present

## 2019-02-16 MED ORDER — ESCITALOPRAM OXALATE 5 MG PO TABS: 5.0000 mg | ORAL_TABLET | Freq: Every day | ORAL | 5 refills | Status: DC

## 2019-02-21 ENCOUNTER — Other Ambulatory Visit: Payer: Self-pay | Admitting: Internal Medicine

## 2019-02-21 DIAGNOSIS — F411 Generalized anxiety disorder: Secondary | ICD-10-CM

## 2019-02-21 MED ORDER — ESCITALOPRAM OXALATE 10 MG PO TABS: 10.0000 mg | ORAL_TABLET | Freq: Every day | ORAL | 1 refills | Status: DC

## 2019-02-22 ENCOUNTER — Ambulatory Visit (INDEPENDENT_AMBULATORY_CARE_PROVIDER_SITE_OTHER): Payer: Medicare HMO | Admitting: Nurse Practitioner

## 2019-02-22 ENCOUNTER — Encounter: Payer: Self-pay | Admitting: Nurse Practitioner

## 2019-02-22 ENCOUNTER — Other Ambulatory Visit: Payer: Self-pay

## 2019-02-22 VITALS — BP 159/92 | HR 65 | Temp 97.1°F | Ht 63.25 in | Wt 147.4 lb

## 2019-02-22 DIAGNOSIS — C44619 Basal cell carcinoma of skin of left upper limb, including shoulder: Secondary | ICD-10-CM

## 2019-02-22 DIAGNOSIS — C44711 Basal cell carcinoma of skin of unspecified lower limb, including hip: Secondary | ICD-10-CM

## 2019-02-22 NOTE — Progress Notes (Signed)
Patient ID: Alicia Wilkerson, female    DOB: 18-Sep-1950, 68 y.o.   MRN: [id number]   Chief Complaint  Patient presents with  . Follow-up    2 weeks for suture removal    Alicia Wilkerson is a 68 yo female who presents for follow up after excision of basil cell carcinoma on the left upper arm, right lower leg, and right thigh on 02/08/19. Pathology for each location read as, "There are inflammatory and reactive changes consistent with a prior procedure. No residual basal cell carcinoma is seen. The margins are free of malignancy."  Patient denies any pain or discomfort. Patient denies any bleeding or drainage. Patient has been covering the excision sites with vaseline and bandaids. Patient states she has a spot on the right side of her face that is being monitored by her dermatologist. Patient states she would prefer to have Dr. Marla Roe perform the biopsy, if needed.   Review of Systems  Constitutional: Negative.   HENT: Negative.   Respiratory: Negative.   Cardiovascular: Negative.   Gastrointestinal: Negative.   Genitourinary: Negative.   Musculoskeletal: Negative.   Skin:       Excision sites, spot on right arm  Neurological: Negative.     Past Medical History:  Diagnosis Date  . Anemia   . Anxiety disorder   . Basal cell carcinoma (BCC) of left upper arm 12/2015  . Basal cell carcinoma (BCC) of right lower leg 04/2016  . Borderline hypertension   . CAD (coronary artery disease)    a. nonobst by cor CT 12/2018.  Marland Kitchen CIN II (cervical intraepithelial neoplasia II)    CIN  1  . Eczema   . Family history of premature CAD   . History of basal cell carcinoma (BCC) excision    left upper arm 2017;   right lower leg 2018;  bilateral lower leg and hip area 2019;   inner right lower leg, outer right thigh, & upper outside left arm 09/ 2020  . History of cervical dysplasia 2013  . History of hereditary spherocytosis    s/p splenectomy in 1954  . History of hyperparathyroidism    per pt yrs ago was put on mega dose of vit d which caused the hyperparathyroid resolved after stopping vit d  . History of pelvic fracture 2013  . MAI (mycobacterium avium-intracellulare) ([place])    ? possibly by CT 12/2018  . Mild hyperlipidemia   . Osteoporosis   . PONV (postoperative nausea and vomiting)   . S/P splenectomy    spherocytosis  . Wears glasses   . White coat syndrome without diagnosis of hypertension     Past Surgical History:  Procedure Laterality Date  . ANKLE SURGERY  03/14/2015   . CERVICAL CONIZATION W/BX N/A 12/13/2018   Procedure: CONIZATION CERVIX WITH BIOPSY;  Surgeon: Megan Salon, MD;  Location: [facility];  Service: Gynecology;  Laterality: N/A;  . FRACTURE SURGERY    . IR RADIOLOGY PERIPHERAL GUIDED IV START  01/05/2019  . IR US GUIDE VASC ACCESS RIGHT  01/05/2019  . KNEE SURGERY Left 1991   per pt retained hardward  . LEEP N/A 12/13/2018   Procedure: possible LOOP ELECTROSURGICAL EXCISION PROCEDURE (LEEP);  Surgeon: Megan Salon, MD;  Location: [facility];  Service: Gynecology;  Laterality: N/A;  . ORIF ANKLE FRACTURE Left 03/14/2015   per pt retained hardware  . SPLENECTOMY, TOTAL  1954   due to spherocytosis  . TUBAL LIGATION Bilateral 1995  Current Outpatient Medications:  .  aspirin EC 81 MG tablet, Take 1 tablet (81 mg total) by mouth daily., Disp: 90 tablet, Rfl: 3 .  clonazePAM (KLONOPIN) 0.5 MG tablet, Take 1 tablet (0.5 mg total) by mouth daily as needed for anxiety., Disp: 30 tablet, Rfl: 2 .  Dermatological Products, Misc. (NEOSALUS) CREA, Apply 1 application topically daily as needed. Reported on 03/06/2015, Disp: 100 g, Rfl: 1 .  erythromycin ophthalmic ointment, Place 1 application into the left eye at bedtime., Disp: 3.5 g, Rfl: 0 .  halobetasol (ULTRAVATE) 0.05 % cream, Apply topically as needed., Disp: , Rfl:  .  OVER THE COUNTER MEDICATION, Fish Oil-Take 2 tablet (694) daily., Disp: , Rfl:  .  rosuvastatin (CRESTOR) 20 MG tablet, Take  1 tablet (20 mg total) by mouth daily., Disp: 90 tablet, Rfl: 1 .  escitalopram (LEXAPRO) 10 MG tablet, Take 1 tablet (10 mg total) by mouth daily. (Patient not taking: Reported on 02/22/2019), Disp: 90 tablet, Rfl: 1   Objective:   Vitals:   02/22/19 1331  BP: (!) 159/92  Pulse: 65  Temp: (!) 97.1 F (36.2 C)  SpO2: 98%    Physical Exam  General: alert, calm, no acute distress HEENT: ~63mm x 3 mm tan lesion on right nose and cheek  Neck: normal Chest: symmetrical rise and fall Lungs: unlabored breathing Musculoskeletal: MAEx4 Neuro: A&O x3, calm, cooperative, steady gait Extremities: left upper arm incision clean, dry, intact, approximated, sutures intact, mild erythema; right lower leg incision clean, dry, intact, approximated, sutures intact, mild erythema; right thigh incision clean, dry, intact, approximated, sutures intact, mild erythema at suture sites Skin: freckles and multiple areas of sun exposure   Assessment & Plan:  Basal cell carcinoma (BCC) of skin of lower extremity including hip, unspecified laterality  Basal cell carcinoma (BCC) of left upper arm  Alicia Wilkerson is a 68 yo female POD #14 s/p excision of basil cell carcinoma. Pathology demonstrates clear margins. The excision sites are healing well. Sutures were removed and steri-strips applied. Follow up as needed.     Alfredo Batty, NP

## 2019-03-11 ENCOUNTER — Other Ambulatory Visit: Payer: Self-pay

## 2019-03-11 DIAGNOSIS — F411 Generalized anxiety disorder: Secondary | ICD-10-CM

## 2019-03-11 MED ORDER — ESCITALOPRAM OXALATE 10 MG PO TABS: 10.0000 mg | ORAL_TABLET | Freq: Every day | ORAL | 1 refills | Status: DC

## 2019-03-25 ENCOUNTER — Other Ambulatory Visit: Payer: Medicare HMO | Admitting: *Deleted

## 2019-03-25 ENCOUNTER — Other Ambulatory Visit: Payer: Self-pay

## 2019-03-25 ENCOUNTER — Ambulatory Visit: Payer: Medicare HMO

## 2019-03-25 DIAGNOSIS — I2510 Atherosclerotic heart disease of native coronary artery without angina pectoris: Secondary | ICD-10-CM | POA: Diagnosis not present

## 2019-03-25 DIAGNOSIS — E785 Hyperlipidemia, unspecified: Secondary | ICD-10-CM | POA: Diagnosis not present

## 2019-03-25 DIAGNOSIS — R030 Elevated blood-pressure reading, without diagnosis of hypertension: Secondary | ICD-10-CM

## 2019-03-25 DIAGNOSIS — R9389 Abnormal findings on diagnostic imaging of other specified body structures: Secondary | ICD-10-CM

## 2019-03-25 LAB — LIPID PANEL
Chol/HDL Ratio: 1.8 ratio (ref 0.0–4.4)
Cholesterol, Total: 135 mg/dL (ref 100–199)
HDL: 75 mg/dL (ref >39)
LDL Chol Calc (NIH): 48 mg/dL (ref 0–99)
Triglycerides: 56 mg/dL (ref 0–149)
VLDL Cholesterol Cal: 12 mg/dL (ref 5–40)

## 2019-03-25 LAB — HEPATIC FUNCTION PANEL
ALT: 27 IU/L (ref 0–32)
AST: 32 IU/L (ref 0–40)
Albumin: 4.5 g/dL (ref 3.8–4.8)
Alkaline Phosphatase: 115 IU/L (ref 39–117)
Bilirubin Total: 1 mg/dL (ref 0.0–1.2)
Bilirubin, Direct: 0.3 mg/dL (ref 0.00–0.40)
Total Protein: 7 g/dL (ref 6.0–8.5)

## 2019-04-02 ENCOUNTER — Ambulatory Visit: Payer: Medicare HMO

## 2019-04-15 ENCOUNTER — Ambulatory Visit: Payer: Medicare HMO

## 2019-05-02 DIAGNOSIS — R69 Illness, unspecified: Secondary | ICD-10-CM | POA: Diagnosis not present

## 2019-05-30 DIAGNOSIS — L738 Other specified follicular disorders: Secondary | ICD-10-CM | POA: Diagnosis not present

## 2019-05-30 DIAGNOSIS — D2261 Melanocytic nevi of right upper limb, including shoulder: Secondary | ICD-10-CM | POA: Diagnosis not present

## 2019-05-30 DIAGNOSIS — L821 Other seborrheic keratosis: Secondary | ICD-10-CM | POA: Diagnosis not present

## 2019-05-30 DIAGNOSIS — D2271 Melanocytic nevi of right lower limb, including hip: Secondary | ICD-10-CM | POA: Diagnosis not present

## 2019-05-30 DIAGNOSIS — Z85828 Personal history of other malignant neoplasm of skin: Secondary | ICD-10-CM | POA: Diagnosis not present

## 2019-06-01 DIAGNOSIS — Z0100 Encounter for examination of eyes and vision without abnormal findings: Secondary | ICD-10-CM | POA: Diagnosis not present

## 2019-06-01 DIAGNOSIS — H524 Presbyopia: Secondary | ICD-10-CM | POA: Diagnosis not present

## 2019-06-20 ENCOUNTER — Encounter: Payer: Self-pay | Admitting: Obstetrics & Gynecology

## 2019-06-20 ENCOUNTER — Telehealth: Payer: Self-pay | Admitting: Obstetrics & Gynecology

## 2019-06-20 NOTE — Telephone Encounter (Signed)
Non-Urgent Medical Question Received: Today Message Contents  Lanique, [place] sent to [facility]  Phone Number: [phone]  [facility] you are well.  Gentle reminder that you had mentioned you wanted to call in a custom rx prior to my next visit to relax my cervix (pap smear?) Jul 07, 2019.   Also, I have had my 2 Covid Moderna shots at [facility], January 30 & April 25, 2019. Please update my records.   [place], Slowey [facility]

## 2019-06-20 NOTE — Telephone Encounter (Signed)
Left message for pt to return call to triage RN.   per Dr Sanjuan Dame OV note on 01/07/19:  D/w pt repeating pap smear in six months and using something for vaginal moisture prior to pap.  Estrogen cream discussed but with upcoming cardiology appt, possibly Vit E would be better.    Covid vaccines updated in Epic.

## 2019-06-21 ENCOUNTER — Other Ambulatory Visit: Payer: Self-pay | Admitting: Obstetrics & Gynecology

## 2019-06-21 DIAGNOSIS — Z1231 Encounter for screening mammogram for malignant neoplasm of breast: Secondary | ICD-10-CM

## 2019-06-21 NOTE — Telephone Encounter (Signed)
AEX 09/23/18 ASCUS pap with +HR HPV 09/23/18 Colpo 10/18/18 LEEP 12/13/18 PMP- no HRT  H/O CIN 2  Spoke with pt. Pt states wanting to know what she needs to use for upcoming 6 month recheck pap on 07/07/19 with Dr Sabra Heck.   Pt states takes Crestor and ASA 81 mg daily per Cardiologist. Last Cardiologist appt 03/25/19 with labs Lipids= normal  Hepatic function panel= normal   Reviewed Dr Sabra Heck pt message notes from 01/15/19:  We should use the vaginal vit e cream compounded at [facility].  Routing to Dr Sabra Heck for Rx request

## 2019-06-27 MED ORDER — NONFORMULARY OR COMPOUNDED ITEM: 0 refills | Status: DC

## 2019-06-27 NOTE — Telephone Encounter (Signed)
Patient is still waiting to hear from [facility] about her prescription.

## 2019-06-27 NOTE — Telephone Encounter (Signed)
Left message for pt to return call to triage RN. 

## 2019-06-27 NOTE — Telephone Encounter (Signed)
Rx printed and faxed for vit e cream 200u/ml, one ml pv three times weekly for two to three weeks prior to pap.  May need to move pap appt by one week if pt is ok with this.  Thanks.

## 2019-06-28 NOTE — Telephone Encounter (Addendum)
Pt returned call. Spoke with pt. Pt given recommendations on new RX for Vit E. Pt agreeable and verbalized understanding.  Moved 6 month pap OV to 07/21/19 at 8 am. Pt verbalized understanding of date and time.   Pt wanting to call [facility] to compare cost and will call back on Wed or Thursday to let know which one she wants to use.   No Rx sent at this time.

## 2019-06-28 NOTE — Telephone Encounter (Signed)
Left message for pt to return call to triage RN. 

## 2019-06-29 ENCOUNTER — Telehealth: Payer: Self-pay | Admitting: Obstetrics & Gynecology

## 2019-06-29 NOTE — Telephone Encounter (Signed)
Spoke with patient. Patient request RX for Vit E Vaginal Cream to [facility]. (see telephone encounter dated 06/20/19)  Rx faxed.  Encounter closed.

## 2019-06-29 NOTE — Telephone Encounter (Signed)
Patient asked to talk with triage again. Patient had been talking with Colletta [place] earlier today, no open phone note.

## 2019-06-29 NOTE — Telephone Encounter (Signed)
Spoke with patient. Patient request RX for Vit E Vaginal Cream to [facility]. (see telephone encounter dated 06/20/19)  Rx faxed.  Encounter closed.

## 2019-07-04 ENCOUNTER — Other Ambulatory Visit: Payer: Self-pay

## 2019-07-04 ENCOUNTER — Ambulatory Visit
Admission: RE | Admit: 2019-07-04 | Discharge: 2019-07-04 | Disposition: A | Discharge status: Home / Self Care | Payer: Medicare HMO | Source: Ambulatory Visit | Attending: Obstetrics & Gynecology | Admitting: Obstetrics & Gynecology

## 2019-07-04 DIAGNOSIS — Z1231 Encounter for screening mammogram for malignant neoplasm of breast: Secondary | ICD-10-CM | POA: Diagnosis not present

## 2019-07-06 ENCOUNTER — Other Ambulatory Visit: Payer: Self-pay | Admitting: Cardiology

## 2019-07-07 ENCOUNTER — Ambulatory Visit: Payer: Medicare HMO | Admitting: Obstetrics & Gynecology

## 2019-07-07 NOTE — Progress Notes (Signed)
GYNECOLOGY  VISIT  CC:   Follow up pap  HPI: 69 y.o. G45P2003 Widowed Other or two or more races female here for 28mth pap.  H/o ASCUS pap 10/18/2018.  Colposcopy was negative.  ECC showed at least CIN 2.  Due to size of cervix, LEEP in OR with ECC that showed atrophic with LEEP.  Pt has many questions which were all addressed.    GYNECOLOGIC HISTORY: Patient's last menstrual period was 02/24/2002. Contraception: post menopausal, BTL Menopausal hormone therapy: none  Patient Active Problem List   Diagnosis Date Noted  . Hyperglycemia 10/19/2018  . Abnormal EKG 04/26/2018  . Hearing loss 10/13/2017  . Basal cell carcinoma (BCC) of skin of lower extremity including hip 05/19/2017  . Anxiety 10/10/2016  . Eczema 10/10/2016  . Back spasm 10/10/2016  . H/O splenectomy 10/10/2016  . H/O basal cell carcinoma excision 10/10/2016  . Dysplasia of cervix, high grade CIN 2 08/21/2016    Past Medical History:  Diagnosis Date  . Anemia   . Anxiety disorder   . Basal cell carcinoma (BCC) of left upper arm 12/2015  . Basal cell carcinoma (BCC) of right lower leg 04/2016  . Borderline hypertension   . CAD (coronary artery disease)    a. nonobst by cor CT 12/2018.  Marland Kitchen CIN II (cervical intraepithelial neoplasia II)    CIN  1  . Eczema   . Family history of premature CAD   . History of basal cell carcinoma (BCC) excision    left upper arm 2017;   right lower leg 2018;  bilateral lower leg and hip area 2019;   inner right lower leg, outer right thigh, & upper outside left arm 09/ 2020  . History of cervical dysplasia 2013  . History of hereditary spherocytosis    s/p splenectomy in 1954  . History of hyperparathyroidism    per pt yrs ago was put on mega dose of vit d which caused the hyperparathyroid resolved after stopping vit d  . History of pelvic fracture 2013  . MAI (mycobacterium avium-intracellulare) ([place])    ? possibly by CT 12/2018  . Mild hyperlipidemia   . Osteoporosis   . PONV  (postoperative nausea and vomiting)   . S/P splenectomy    spherocytosis  . Wears glasses   . White coat syndrome without diagnosis of hypertension     Past Surgical History:  Procedure Laterality Date  . ANKLE SURGERY  03/14/2015   . CERVICAL CONIZATION W/BX N/A 12/13/2018   Procedure: CONIZATION CERVIX WITH BIOPSY;  Surgeon: Megan Salon, MD;  Location: [facility];  Service: Gynecology;  Laterality: N/A;  . FRACTURE SURGERY    . IR RADIOLOGY PERIPHERAL GUIDED IV START  01/05/2019  . IR US GUIDE VASC ACCESS RIGHT  01/05/2019  . KNEE SURGERY Left 1991   per pt retained hardward  . LEEP N/A 12/13/2018   Procedure: possible LOOP ELECTROSURGICAL EXCISION PROCEDURE (LEEP);  Surgeon: Megan Salon, MD;  Location: [facility];  Service: Gynecology;  Laterality: N/A;  . ORIF ANKLE FRACTURE Left 03/14/2015   per pt retained hardware  . SPLENECTOMY, TOTAL  1954   due to spherocytosis  . TUBAL LIGATION Bilateral 1995    MEDS:   Current Outpatient Medications on File Prior to Visit  Medication Sig Dispense Refill  . aspirin EC 81 MG tablet Take 1 tablet (81 mg total) by mouth daily. 90 tablet 3  . clonazePAM (KLONOPIN) 0.5 MG tablet Take 1 tablet (0.5 mg total)  by mouth daily as needed for anxiety. 30 tablet 2  . escitalopram (LEXAPRO) 10 MG tablet Take 1 tablet (10 mg total) by mouth daily. (Patient taking differently: Take 5 mg by mouth daily. ) 90 tablet 1  . halobetasol (ULTRAVATE) 0.05 % cream Apply topically as needed.    . NONFORMULARY OR COMPOUNDED ITEM Vitamin E vaginal cream 200u/ml.  One pv three times weekly for two to three weeks prior to procedure. 24 each 0  . OVER THE COUNTER MEDICATION Fish Oil-Take 2 tablet (694) daily.    . rosuvastatin (CRESTOR) 20 MG tablet TAKE 1 TABLET BY MOUTH EVERY DAY 90 tablet 1   No current facility-administered medications on file prior to visit.    ALLERGIES: Penicillins and Sulfa antibiotics  Family History  Problem Relation Age of Onset  .  Heart failure Father   . Skin cancer Father   . Hypertension Father   . Heart disease Father   . Osteoporosis Mother   . Colon cancer Mother        deceased  . Heart disease Sister   . Crohn's disease Daughter     SH:  Widowed, non smoker  Review of Systems  Constitutional: Negative.   HENT: Negative.   Eyes: Negative.   Respiratory: Negative.   Cardiovascular: Negative.   Gastrointestinal: Negative.   Endocrine: Negative.   Genitourinary: Negative.   Musculoskeletal: Negative.   Skin: Negative.   Allergic/Immunologic: Negative.   Neurological: Negative.   Hematological: Negative.   Psychiatric/Behavioral: Negative.     PHYSICAL EXAMINATION:    BP 124/80   Pulse 68   Temp (!) 97 F (36.1 C) (Skin)   Resp 16   Wt 142 lb (64.4 kg)   LMP 02/24/2002   BMI 24.96 kg/m    (-10 pounds) General appearance: alert, cooperative and appears stated age Lymph:  no inguinal LAD noted  Pelvic: External genitalia:  no lesions              Urethra:  normal appearing urethra with no masses, tenderness or lesions              Bartholins and Skenes: normal                 Vagina: normal appearing vagina with normal color and discharge, no lesions              Cervix: no lesions and cervix almost flush with the vaginal apex              Bimanual Exam:  Uterus:  normal size, contour, position, consistency, mobility, non-tender              Adnexa: no mass, fullness, tenderness    Chaperone, Terence Lux, CMA, was present for exam.  Assessment: H/o CIN 2 with LEEP showing atrophy  Plan: Concerns addressed today.  Pap and HR HPV obtained today,   ~20 minutes spent with patient >50% of time was in face to face discussion of above.

## 2019-07-20 ENCOUNTER — Telehealth: Payer: Self-pay | Admitting: Obstetrics & Gynecology

## 2019-07-20 ENCOUNTER — Other Ambulatory Visit: Payer: Self-pay

## 2019-07-20 NOTE — Telephone Encounter (Signed)
Patient has a 6 month pap appointment tomorrow and is due to use her vitamin e oil vaginally tonight. She wonders if she needs to skip this dose due to having a pap smear tomorrow?. She said you may leave details on her voice mail.

## 2019-07-20 NOTE — Telephone Encounter (Signed)
Spoke with patient. Patient is returning for 6 mo f/u pap on 5/27, has been using Vit E vaginal cream 3 times a week. She typically places cream mondays, wednesdays and fridays. Patient asking if ok to use cream tonight? Or ok to hold off until after pap?  Advised patient no concerns that I am aware of with pap, but ok to hold off using tonight if any concerns. Patient will not use Vit E cream tonight, will resume on Thursday. Questions answered.   Advised patient I will forward to Dr. Sabra Heck to review, will f/u with any additional remay not be immediate. Patient is agreeable.    Routing to provider for final review. Patient is agreeable to disposition. Will close encounter.

## 2019-07-21 ENCOUNTER — Ambulatory Visit (INDEPENDENT_AMBULATORY_CARE_PROVIDER_SITE_OTHER): Payer: Medicare HMO | Admitting: Obstetrics & Gynecology

## 2019-07-21 ENCOUNTER — Other Ambulatory Visit (HOSPITAL_COMMUNITY)
Admission: RE | Admit: 2019-07-21 | Discharge: 2019-07-21 | Disposition: A | Discharge status: Home / Self Care | Payer: Medicare HMO | Source: Ambulatory Visit | Attending: Obstetrics & Gynecology | Admitting: Obstetrics & Gynecology

## 2019-07-21 ENCOUNTER — Other Ambulatory Visit: Payer: Self-pay

## 2019-07-21 ENCOUNTER — Encounter: Payer: Self-pay | Admitting: Obstetrics & Gynecology

## 2019-07-21 VITALS — BP 124/80 | HR 68 | Temp 97.0°F | Resp 16 | Wt 142.0 lb

## 2019-07-21 DIAGNOSIS — Z124 Encounter for screening for malignant neoplasm of cervix: Secondary | ICD-10-CM | POA: Diagnosis present

## 2019-07-21 DIAGNOSIS — N871 Moderate cervical dysplasia: Secondary | ICD-10-CM

## 2019-07-21 DIAGNOSIS — Z1151 Encounter for screening for human papillomavirus (HPV): Secondary | ICD-10-CM | POA: Insufficient documentation

## 2019-07-21 DIAGNOSIS — Z780 Asymptomatic menopausal state: Secondary | ICD-10-CM | POA: Diagnosis not present

## 2019-07-21 DIAGNOSIS — R69 Illness, unspecified: Secondary | ICD-10-CM | POA: Diagnosis not present

## 2019-07-21 DIAGNOSIS — Z87410 Personal history of cervical dysplasia: Secondary | ICD-10-CM | POA: Diagnosis not present

## 2019-07-26 LAB — CYTOLOGY - PAP
Comment: NEGATIVE
High risk HPV: POSITIVE — AB

## 2019-07-28 ENCOUNTER — Telehealth: Payer: Self-pay | Admitting: Internal Medicine

## 2019-07-28 NOTE — Progress Notes (Signed)
  Chronic Care Management   Outreach Note  07/28/2019 Name: Alicia Wilkerson MRN: [id number] DOB: [birth year]  Referred by: Binnie Rail, MD Reason for referral : No chief complaint on file.   An unsuccessful telephone outreach was attempted today. The patient was referred to the pharmacist for assistance with care management and care coordination.   This note is not being shared with the patient for the following reason: To respect privacy (The patient or proxy has requested that the information not be shared).  Follow Up Plan:   Earney Hamburg Upstream Scheduler

## 2019-07-28 NOTE — Progress Notes (Signed)
  Chronic Care Management   Note  07/28/2019 Name: [name] MRN: [id number] DOB: [birth year]  Alicia Wilkerson is a 69 y.o. year old female who is a primary care patient of Burns, Claudina Lick, MD. I reached out to Alicia Wilkerson by phone today in response to a referral sent by Ms. Berlin Hun Yapp's PCP, Binnie Rail, MD.   Ms. Dust was given information about Chronic Care Management services today including:  1. CCM service includes personalized support from designated clinical staff supervised by her physician, including individualized plan of care and coordination with other care providers 2. 24/7 contact phone numbers for assistance for urgent and routine care needs. 3. Service will only be billed when office clinical staff spend 20 minutes or more in a month to coordinate care. 4. Only one practitioner may furnish and bill the service in a calendar month. 5. The patient may stop CCM services at any time (effective at the end of the month) by phone call to the office staff.   Patient agreed to services and verbal consent obtained.   This note is not being shared with the patient for the following reason: To respect privacy (The patient or proxy has requested that the information not be shared).  Follow up plan:   Earney Hamburg Upstream Scheduler

## 2019-07-29 ENCOUNTER — Telehealth: Payer: Self-pay

## 2019-07-29 NOTE — Telephone Encounter (Signed)
Spoke with patient. Results given. Patient verbalizes understanding. Patient would like to schedule consult with Dr.Miller to further discuss. Appointment scheduled for 08/02/2019 at 3:30 pm with Dr.Miller. Patient is agreeable to date and time.  Routing to provider and will close encounter.

## 2019-07-29 NOTE — Telephone Encounter (Signed)
-----   Message from Megan Salon, MD sent at 07/28/2019  6:51 AM EDT ----- Please call pt.  Her pap showed epithelial atypia and continues to have HR HPV.  She had a LEEP 11/2018.  She knows if this was abnormal in any way, I was going to recommend a hysterectomy.  That is now what I am recommending.  TLH/probable BSO, cystoscopy.  She is likely going to need a consultation again.  She always has many questions.    CC:  Royal Hawthorn, CMA

## 2019-07-29 NOTE — Progress Notes (Deleted)
GYNECOLOGY  VISIT  CC:   ***  HPI: 69 y.o. G6P2003 Widowed Other or two or more races female here for discuss pap results & surgery.  GYNECOLOGIC HISTORY: Patient's last menstrual period was 02/24/2002. Contraception: *** Menopausal hormone therapy: ***  Patient Active Problem List   Diagnosis Date Noted   Hyperglycemia 10/19/2018   Abnormal EKG 04/26/2018   Hearing loss 10/13/2017   Basal cell carcinoma (BCC) of skin of lower extremity including hip 05/19/2017   Anxiety 10/10/2016   Eczema 10/10/2016   Back spasm 10/10/2016   H/O splenectomy 10/10/2016   H/O basal cell carcinoma excision 10/10/2016   Dysplasia of cervix, high grade CIN 2 08/21/2016    Past Medical History:  Diagnosis Date   Anemia    Anxiety disorder    Basal cell carcinoma (BCC) of left upper arm 12/2015   Basal cell carcinoma (BCC) of right lower leg 04/2016   Borderline hypertension    CAD (coronary artery disease)    a. nonobst by cor CT 12/2018.   CIN II (cervical intraepithelial neoplasia II)    CIN  1   Eczema    Family history of premature CAD    History of basal cell carcinoma (BCC) excision    left upper arm 2017;   right lower leg 2018;  bilateral lower leg and hip area 2019;   inner right lower leg, outer right thigh, & upper outside left arm 09/ 2020   History of cervical dysplasia 2013   History of hereditary spherocytosis    s/p splenectomy in 1954   History of hyperparathyroidism    per pt yrs ago was put on mega dose of vit d which caused the hyperparathyroid resolved after stopping vit d   History of pelvic fracture 2013   MAI (mycobacterium avium-intracellulare) ([place])    ? possibly by CT 12/2018   Mild hyperlipidemia    Osteoporosis    PONV (postoperative nausea and vomiting)    S/P splenectomy    spherocytosis   Wears glasses    White coat syndrome without diagnosis of hypertension     Past Surgical History:  Procedure Laterality Date    ANKLE SURGERY  03/14/2015    CERVICAL CONIZATION W/BX N/A 12/13/2018   Procedure: CONIZATION CERVIX WITH BIOPSY;  Surgeon: Megan Salon, MD;  Location: [facility];  Service: Gynecology;  Laterality: N/A;   FRACTURE SURGERY     IR RADIOLOGY PERIPHERAL GUIDED IV START  01/05/2019   IR US GUIDE VASC ACCESS RIGHT  01/05/2019   KNEE SURGERY Left 1991   per pt retained hardward   LEEP N/A 12/13/2018   Procedure: possible LOOP ELECTROSURGICAL EXCISION PROCEDURE (LEEP);  Surgeon: Megan Salon, MD;  Location: [facility];  Service: Gynecology;  Laterality: N/A;   ORIF ANKLE FRACTURE Left 03/14/2015   per pt retained hardware   SPLENECTOMY, TOTAL  1954   due to spherocytosis   TUBAL LIGATION Bilateral 1995    MEDS:   Current Outpatient Medications on File Prior to Visit  Medication Sig Dispense Refill   aspirin EC 81 MG tablet Take 1 tablet (81 mg total) by mouth daily. 90 tablet 3   clonazePAM (KLONOPIN) 0.5 MG tablet Take 1 tablet (0.5 mg total) by mouth daily as needed for anxiety. 30 tablet 2   escitalopram (LEXAPRO) 10 MG tablet Take 1 tablet (10 mg total) by mouth daily. (Patient taking differently: Take 5 mg by mouth daily. ) 90 tablet 1   halobetasol (ULTRAVATE) 0.05 %  cream Apply topically as needed.     NONFORMULARY OR COMPOUNDED ITEM Vitamin E vaginal cream 200u/ml.  One pv three times weekly for two to three weeks prior to procedure. 24 each 0   OVER THE COUNTER MEDICATION Fish Oil-Take 2 tablet (694) daily.     rosuvastatin (CRESTOR) 20 MG tablet TAKE 1 TABLET BY MOUTH EVERY DAY 90 tablet 1   No current facility-administered medications on file prior to visit.    ALLERGIES: Penicillins and Sulfa antibiotics  Family History  Problem Relation Age of Onset   Heart failure Father    Skin cancer Father    Hypertension Father    Heart disease Father    Osteoporosis Mother    Colon cancer Mother        deceased   Heart disease Sister    Crohn's disease Daughter      SH:  ***  Review of Systems  PHYSICAL EXAMINATION:    LMP 02/24/2002     General appearance: alert, cooperative and appears stated age Neck: no adenopathy, supple, symmetrical, trachea midline and thyroid {CHL AMB PHY EX THYROID NORM DEFAULT:[phone]::"normal to inspection and palpation"} CV:  {Exam; heart brief:31539} Lungs:  {pe lungs ob:314451::"clear to auscultation, no wheezes, rales or rhonchi, symmetric air entry"} Breasts: {Exam; breast:13139::"normal appearance, no masses or tenderness"} Abdomen: soft, non-tender; bowel sounds normal; no masses,  no organomegaly Lymph:  no inguinal LAD noted  Pelvic: External genitalia:  no lesions              Urethra:  normal appearing urethra with no masses, tenderness or lesions              Bartholins and Skenes: normal                 Vagina: normal appearing vagina with normal color and discharge, no lesions              Cervix: {CHL AMB PHY EX CERVIX NORM DEFAULT:[id number]::"no lesions"}              Bimanual Exam:  Uterus:  {CHL AMB PHY EX UTERUS NORM DEFAULT:[phone]::"normal size, contour, position, consistency, mobility, non-tender"}              Adnexa: {CHL AMB PHY EX ADNEXA NO MASS [phone]::"no mass, fullness, tenderness"}              Rectovaginal: {yes no:[id number]}.  Confirms.              Anus:  normal sphincter tone, no lesions  Chaperone, ***Terence Lux, CMA, was present for exam.  Assessment: ***  Plan: ***   ~{NUMBERS; -10-45 JOINT ROM:10287} minutes spent with patient >50% of time was in face to face discussion of above.

## 2019-08-02 ENCOUNTER — Ambulatory Visit: Payer: Medicare HMO | Admitting: Obstetrics & Gynecology

## 2019-08-02 ENCOUNTER — Telehealth: Payer: Self-pay

## 2019-08-02 NOTE — Telephone Encounter (Signed)
Spoke with patient. Appointment rescheduled for 08/05/2019 at 11:40 am with Dr.Miller. Patient is agreeable to date and time. Patient is planning to attend a family reunion in 35 weeks and would like to proceed with surgery after if possible. That would place procedure in August or September. Advised will ask Dr.Miller and let her know as soon as possible as she is trying to make travel arrangements.

## 2019-08-02 NOTE — Telephone Encounter (Signed)
Patients appointment for today was cancelled due to doctor being in surgery. Appointment needs to be rescheduled, need triage to assist.   Patient would like to know when her surgery can be scheduled for. Patient stated she was waiting to discuss with doctor at this appointment to see if it was needed to be scheduled sooner than later.

## 2019-08-02 NOTE — Telephone Encounter (Signed)
Routing to [employer] for return call to discuss surgery planning.

## 2019-08-03 NOTE — Telephone Encounter (Signed)
Ok to proceed with surgical planning.  TLH/BSO/cystoscopy due to recurrent abnormal pap smears, +HR HPV, cervical dysplasia.

## 2019-08-03 NOTE — Telephone Encounter (Signed)
Spoke with patient. Patient would like to see Dr.Miller on Friday to discuss before proceeding. Then will plan to schedule.

## 2019-08-04 NOTE — Progress Notes (Signed)
GYNECOLOGY  VISIT  CC:   Discuss pap results and treatment/follow up options  HPI: 69 y.o. G18P2003 Widowed Caucasian female here to discuss most recent pap smear.  Pt has long hx of abnormal pap smears for at least 7 years.  Pap smear, evaluation and treatments are as follows:  4/14:  LGSIL pap, +HR HPV 4/14: colposcopy with negative findings and negative ECC 4/15:  Neg pap with +HR HPV 4/15 colposcopy neg ECC 3/16:  ASCUS +HR HPV 6/17:  ASCUS H, +HR HPV 7/17 colposcopy with neg ECC 9/17 LEEP with CIN 2, margins appeared clear 12/17 neg pap +HR HPV 6/18:  Neg pap with neg HR HPV 7/19:  Neg pap with neg HR HPV 08/2018:  ASCUS pap with+ HR HPV 09/2018:  ECC with high grade findings 11/2018:  LEEP wit atrophic changes, neg ECC 06/2019:  Atrophy with epithelial atypical and +HR HPV  At this point, the pt's cervix is small and almost flush with the vagina making evaluation very difficult.  As she still has persistent high risk HPV as well as abnormal pap smears, I am recommending pt consider proceeding with hysterectomy.  She is aware I am concerned about the ability to fully evaluate her cervix.  As well, if another excisional procedure is necessary for further evaluation, there will be very little cervix left.  This will make a laparoscopic procedure evan more difficult to complete.  Pt has many questions today and these were all addressed.  She specifically questioned the removal of ovaries and/or fallopian tubes.  We reviewed pros/cons of this decision.    In addition, hospital stay, recovery and pain management all discussed.  Risks discussed including but not limited to bleeding, 1% risk of receiving a  transfusion, infection, 3-4% risk of bowel/bladder/ureteral/vascular injury discussed as well as possible need for additional surgery if injury does occur discussed.  DVT/PE and rare risk of death discussed.  My actual complications with prior surgeries discussed.  Vaginal cuff dehiscence  discussed.  Hernia formation discussed.  Positioning and incision locations discussed.  Patient aware if pathology abnormal she may need additional treatment.  We also discussed conservative management including continued surveillance and ASCCP guidelines.  Pt is aware I am concerned there is deeper cervical pathology that I have not identified and the difficulty with evaluation due to size and appearance of the cervix.     Pt is currently undergoing evaluation for coronary calcium and is being followed by cardiology.  She has an appt in July.    GYNECOLOGIC HISTORY: Patient's last menstrual period was 02/24/2002. Contraception: post menopausal Menopausal hormone therapy: none  Patient Active Problem List   Diagnosis Date Noted  . Hyperglycemia 10/19/2018  . Abnormal EKG 04/26/2018  . Hearing loss 10/13/2017  . Basal cell carcinoma (BCC) of skin of lower extremity including hip 05/19/2017  . Anxiety 10/10/2016  . Eczema 10/10/2016  . Back spasm 10/10/2016  . H/O splenectomy 10/10/2016  . H/O basal cell carcinoma excision 10/10/2016  . Dysplasia of cervix, high grade CIN 2 08/21/2016    Past Medical History:  Diagnosis Date  . Anemia   . Anxiety disorder   . Basal cell carcinoma (BCC) of left upper arm 12/2015  . Basal cell carcinoma (BCC) of right lower leg 04/2016  . Borderline hypertension   . CAD (coronary artery disease)    a. nonobst by cor CT 12/2018.  Marland Kitchen CIN II (cervical intraepithelial neoplasia II)    CIN  1  . Eczema   .  Family history of premature CAD   . History of basal cell carcinoma (BCC) excision    left upper arm 2017;   right lower leg 2018;  bilateral lower leg and hip area 2019;   inner right lower leg, outer right thigh, & upper outside left arm 09/ 2020  . History of cervical dysplasia 2013  . History of hereditary spherocytosis    s/p splenectomy in 1954  . History of hyperparathyroidism    per pt yrs ago was put on mega dose of vit d which caused the  hyperparathyroid resolved after stopping vit d  . History of pelvic fracture 2013  . MAI (mycobacterium avium-intracellulare) ([place])    ? possibly by CT 12/2018  . Mild hyperlipidemia   . Osteoporosis   . PONV (postoperative nausea and vomiting)   . S/P splenectomy    spherocytosis  . Wears glasses   . White coat syndrome without diagnosis of hypertension     Past Surgical History:  Procedure Laterality Date  . ANKLE SURGERY  03/14/2015   . CERVICAL CONIZATION W/BX N/A 12/13/2018   Procedure: CONIZATION CERVIX WITH BIOPSY;  Surgeon: Megan Salon, MD;  Location: [facility];  Service: Gynecology;  Laterality: N/A;  . FRACTURE SURGERY    . IR RADIOLOGY PERIPHERAL GUIDED IV START  01/05/2019  . IR US GUIDE VASC ACCESS RIGHT  01/05/2019  . KNEE SURGERY Left 1991   per pt retained hardward  . LEEP N/A 12/13/2018   Procedure: possible LOOP ELECTROSURGICAL EXCISION PROCEDURE (LEEP);  Surgeon: Megan Salon, MD;  Location: [facility];  Service: Gynecology;  Laterality: N/A;  . ORIF ANKLE FRACTURE Left 03/14/2015   per pt retained hardware  . SPLENECTOMY, TOTAL  1954   due to spherocytosis  . TUBAL LIGATION Bilateral 1995    MEDS:   Current Outpatient Medications on File Prior to Visit  Medication Sig Dispense Refill  . aspirin EC 81 MG tablet Take 1 tablet (81 mg total) by mouth daily. 90 tablet 3  . clonazePAM (KLONOPIN) 0.5 MG tablet Take 1 tablet (0.5 mg total) by mouth daily as needed for anxiety. 30 tablet 2  . escitalopram (LEXAPRO) 10 MG tablet Take 1 tablet (10 mg total) by mouth daily. (Patient taking differently: Take 5 mg by mouth daily. ) 90 tablet 1  . halobetasol (ULTRAVATE) 0.05 % cream Apply topically as needed.    Marland Kitchen OVER THE COUNTER MEDICATION Fish Oil-Take 2 tablet (694) daily.    . rosuvastatin (CRESTOR) 20 MG tablet TAKE 1 TABLET BY MOUTH EVERY DAY 90 tablet 1   No current facility-administered medications on file prior to visit.    ALLERGIES: Penicillins and Sulfa  antibiotics  Family History  Problem Relation Age of Onset  . Heart failure Father   . Skin cancer Father   . Hypertension Father   . Heart disease Father   . Osteoporosis Mother   . Colon cancer Mother        deceased  . Heart disease Sister   . Crohn's disease Daughter     SH:  Widowed, non smoker  Review of Systems  Constitutional: Negative.   HENT: Negative.   Eyes: Negative.   Respiratory: Negative.   Cardiovascular: Negative.   Gastrointestinal: Negative.   Endocrine: Negative.   Genitourinary: Negative.   Musculoskeletal: Negative.   Skin: Negative.   Allergic/Immunologic: Negative.   Neurological: Negative.   Hematological: Negative.   Psychiatric/Behavioral: Negative.     PHYSICAL EXAMINATION:  BP 118/78   Pulse 80   Temp (!) 97 F (36.1 C) (Skin)   Resp 16   Wt 140 lb (63.5 kg)   LMP 02/24/2002   BMI 24.60 kg/m     General appearance: alert, cooperative and appears stated age No physical exam performed today   Assessment: Persistent high risk HPV on cervix H/o abnormal pap smears H/o CIN 2  Plan: She is most likely going to proceed with hysterectomy via TLH, bilateral salpingectomy, possible BSO, Cystoscopy.  Pt would like to consider timing around labor day.  Will proceed with presurgical planning.   50 minutes of face to face time spent with pt today

## 2019-08-05 ENCOUNTER — Ambulatory Visit: Payer: Medicare HMO | Admitting: Obstetrics & Gynecology

## 2019-08-05 ENCOUNTER — Other Ambulatory Visit: Payer: Self-pay

## 2019-08-05 ENCOUNTER — Encounter: Payer: Self-pay | Admitting: Obstetrics & Gynecology

## 2019-08-05 VITALS — BP 118/78 | HR 80 | Temp 97.0°F | Resp 16 | Wt 140.0 lb

## 2019-08-05 DIAGNOSIS — N871 Moderate cervical dysplasia: Secondary | ICD-10-CM

## 2019-08-05 DIAGNOSIS — B977 Papillomavirus as the cause of diseases classified elsewhere: Secondary | ICD-10-CM

## 2019-08-05 DIAGNOSIS — R69 Illness, unspecified: Secondary | ICD-10-CM | POA: Diagnosis not present

## 2019-08-11 ENCOUNTER — Telehealth: Payer: Self-pay | Admitting: Obstetrics & Gynecology

## 2019-08-11 NOTE — Telephone Encounter (Signed)
Call to patient. Per DPR, OK to leave message on voicemail.   Left voicemail requesting a return call to [facility] to review benefits for recommended surgery with M. Edwinna Areola, MD

## 2019-08-11 NOTE — Telephone Encounter (Signed)
Spoke with patient regarding surgery benefits. Patient acknowledges understanding of information presented. Patient is aware that benefits presented are for professional benefits only. Patient is aware that once surgery is scheduled, the hospital will call with separate benefits. Patient is aware of surgery cancellation policy.  Patient has multiple follow up questions.  1. Patient stated that she was looking on MyChart and saw that she is overdue for her pneumonia vaccine. Patient is wanting to know if that is something that she should get prior to surgery.  2. Patient is planning to return from [place] on 10/12/2019. Patient has a phone consult with a pharmacist on 10/19/2019 and her annual exam with her PCP on 10/24/2019. Patient is wanting to be sure that all of these dates are okay to keep if we proceed with scheduling surgery on 10/18/2019.   3. Patient is also wanting verification that this is not cancer. Patient stated that she is questioning cancelling her trip to [place] in August and proceeding with scheduling surgery sooner if there is a possibility of cancer.  4. Patient stated that she will be overdue for her annual exam with our office at the time of surgery and is wanting to know if that should be completed prior to surgery, or if she should wait until after surgery.  Dr. Sabra Heck, please review and advise. Cc: Verline Lema, RN.

## 2019-08-11 NOTE — Telephone Encounter (Signed)
Patient is returning call to [place]. 

## 2019-08-12 NOTE — Telephone Encounter (Signed)
Routing to Cass to call.  1) It's fine for her to get the pneumovax before surgery.  However, it's not something that must be done.  It's ok to wait until after surgery as well.  2) It might be a little easier on her first week of recovery to push the appts with new PCP out a week or two after the surgery instead of the same week.  3) These is no evidence this is cancer.  She should go on her trip to [place].  4) I'll basically do a full exam when she has her pre-op and we will review almost everything I review at an AEX.  I don't think she needs a separate appt for an AEX.  Thanks.

## 2019-08-15 NOTE — Telephone Encounter (Signed)
Patient returned call

## 2019-08-15 NOTE — Telephone Encounter (Signed)
Call to patient. Left message to call back.

## 2019-08-16 NOTE — Telephone Encounter (Signed)
Attempted to reach patient x2 at number provided and phone line rings and seems to be picked up and then rings with a busy tone.

## 2019-08-16 NOTE — Telephone Encounter (Signed)
Patient returned call. Request that [facility] call her today.

## 2019-08-16 NOTE — Telephone Encounter (Signed)
Spoke with patient. Patient would like surgery on 10/18/2019. TLH/BS/POSS OOPHORECTOMY/CYSTO scheduled for 10/18/2019 at 0730 at [facility]. Pre op scheduled for 09/29/2019 at 8 am with Dr.Miller. COVID test scheduled for 10/14/2019 at 3 pm [facility] location. Patient is aware of the need to quarantine after test until surgery. 1 week post op scheduled for 10/27/2019 at 1 pm with Dr.Miller. 6 week post op scheduled for 11/29/2019 at 2:30 pm with Dr.Miller. Surgery instructions reviewed and mailed to patient's verified home address on file. Patient verbalizes understanding.  Routing to provider and will close encounter.

## 2019-08-19 ENCOUNTER — Other Ambulatory Visit: Payer: Self-pay | Admitting: Obstetrics & Gynecology

## 2019-08-22 ENCOUNTER — Telehealth: Payer: Self-pay | Admitting: Cardiology

## 2019-08-22 NOTE — Telephone Encounter (Signed)
°  ° °  [facility] Pre-operative Risk Assessment    HEARTCARE STAFF: - Please ensure there is not already an duplicate clearance open for this procedure. - Under Visit Info/Reason for Call, type in Other and utilize the format Clearance MM/DD/YY or Clearance TBD. Do not use dashes or single digits. - If request is for dental extraction, please clarify the # of teeth to be extracted.  Request for surgical clearance:  1. What type of surgery is being performed? Total laproscopic hysterectomy with removal of tubes and ovaries  2. When is this surgery scheduled? 10/18/19  3. What type of clearance is required (medical clearance vs. Pharmacy clearance to hold med vs. Both)? both  4. Are there any medications that need to be held prior to surgery and how long? Not sure  5. Practice name and name of physician performing surgery? Dr. Edwinna Areola, [place]  6. What is the office phone number? [phone]   7.   What is the office fax number? [phone]  8.   Anesthesia type (None, local, MAC, general) ? General   Lysbeth Galas S 08/22/2019, 11:46 AM  _________________________________________________________________   (provider comments below)

## 2019-08-22 NOTE — Telephone Encounter (Signed)
Will forward clearance notes to MD for upcoming appt 09/08/19. I will remove from the pre op call back pool.

## 2019-08-22 NOTE — Telephone Encounter (Signed)
   Primary Cardiologist: Ena Dawley, MD  Chart reviewed as part of pre-operative protocol coverage. Patient is noted to have a follow-up appointment fairly soon on July 15th with Dr. Meda Coffee for 6 month follow-up. I would recommend keeping this appointment for in-person discussion of pre-operative clearance, since procedure is not until late August.   Callback, please send MyChart message to patient making her aware that we received clearance and to discuss at that [place].   Will route this update to requesting provider via Epic fax function. Please call with questions.  Charlie Pitter, PA-C 08/22/2019, 12:17 PM

## 2019-09-08 ENCOUNTER — Ambulatory Visit: Payer: Medicare HMO | Admitting: Cardiology

## 2019-09-08 ENCOUNTER — Encounter: Payer: Self-pay | Admitting: Cardiology

## 2019-09-08 ENCOUNTER — Other Ambulatory Visit: Payer: Self-pay

## 2019-09-08 VITALS — BP 142/80 | HR 60 | Ht 63.4 in | Wt 141.2 lb

## 2019-09-08 DIAGNOSIS — R030 Elevated blood-pressure reading, without diagnosis of hypertension: Secondary | ICD-10-CM | POA: Diagnosis not present

## 2019-09-08 DIAGNOSIS — I2510 Atherosclerotic heart disease of native coronary artery without angina pectoris: Secondary | ICD-10-CM

## 2019-09-08 DIAGNOSIS — E785 Hyperlipidemia, unspecified: Secondary | ICD-10-CM | POA: Diagnosis not present

## 2019-09-08 MED ORDER — LOSARTAN POTASSIUM 25 MG PO TABS: 25.0000 mg | ORAL_TABLET | Freq: Every day | ORAL | 2 refills | Status: DC

## 2019-09-08 NOTE — Patient Instructions (Signed)
Medication Instructions:   START TAKING LOSARTAN 25 MG BY MOUTH DAILY *If you need a refill on your cardiac medications before your next appointment, please call your pharmacy*   Follow-Up: At [facility], you and your health needs are our priority.  As part of our continuing mission to provide you with exceptional heart care, we have created designated Provider Care Teams.  These Care Teams include your primary Cardiologist (physician) and Advanced Practice Providers (APPs -  Physician Assistants and Nurse Practitioners) who all work together to provide you with the care you need, when you need it.  We recommend signing up for the patient portal called "MyChart".  Sign up information is provided on this After Visit Summary.  MyChart is used to connect with patients for Virtual Visits (Telemedicine).  Patients are able to view lab/test results, encounter notes, upcoming appointments, etc.  Non-urgent messages can be sent to your provider as well.   To learn more about what you can do with MyChart, go to [email or web address].    Your next appointment:   6 month(s)  The format for your next appointment:   In Person  Provider:   Ena Dawley, MD   Other Instructions  DR. NELSON WOULD LIKE FOR YOU TO LOOK UP THE MEDITERRANEAN DIET ON THE INTERNET, TO HELP WITH YOUR DIET.

## 2019-09-08 NOTE — Progress Notes (Signed)
Cardiology Office Note    Date:  09/08/2019   ID:  Alicia Wilkerson, DOB 06-26-1950, MRN [id number]  PCP:  Binnie Rail, MD  Cardiologist:  Ena Dawley, MD  Electrophysiologist:  None   Chief Complaint: 1 year follow-up, hypertension  History of Present Illness:   Alicia Wilkerson is a 69 y.o. female with history of longstanding anxiety, anemia, FH of early CAD (in her father, sister, uncle, aunt), secondary hyperparathyroidism (due to excess vitamin D), osteoporosis/osteopenia, spherocytosis s/p splenectomy, borderline HTN, mild hyperlipidemia, CAD by coronary CTA 12/2018, possible MAI by CT 12/2018 who presents to follow up cardiac testing.   She was recently seen in the office 10/2018 for dyspnea on exertion. At the visit her blood pressure was significantly elevated. Per Dr. Francesca Oman note she became significantly anxious at the suggestion of blood pressure medication. 2D echo showed EF 60-65%, no LVH, + elevated LVEDP, impaired relaxation, normal RV, normal PASP, mild TR. Coronary CTA showed calcium score of 544, 96 percentile for age and sex matched control. There was diffuse moderate to severe calcified plaque in the proximal to mid LAD with possibly severe stenosis in the mid LAD. FFR analysis of the LAD results were Proximal: 0.94, mid: 0.92, distal: 0.82, arguing against any hemodynamically significant stenosis. Chest overread indicated the appearance of the lungs suggested chronic indolent atypical infectious process such as mycobacterium avium intracellulare (MAI) and aortic atherosclerosis, so outpatient pulmonary referral was recommended. The patient wished to defer as she has felt too overwhelmed between the election, Covid and seeing various specialties. Based on study, it was recommended to start ASA 81mg  and Crestor 20mg  daily.  Last labs 11/2018 showed BNP 103, Hgb 14.2, K 4.2, Cr 0.75, glucose 104, 09/2018 TSH wnl, LDL 113, LFTs wnl.  09/08/2019 -the patient is coming for  regular follow-up, she remains very active she has lost 10 pounds, she tries to eat healthy, she starts eating at 4 PM and then has dinner, she does not eat during the day.  She denies any chest pain or shortness of breath.  She brings blood pressure diary and most of her blood pressures are in the 130s to 140s to 150s range and diastolic blood pressure mostly in 80s.  She denies any lower extremity edema orthopnea proximal nocturnal dyspnea.  He is tolerating rosuvastatin well.  She is not taking any blood pressure medications.  Past Medical History:  Diagnosis Date  . Anemia   . Anxiety disorder   . Basal cell carcinoma (BCC) of left upper arm 12/2015  . Basal cell carcinoma (BCC) of right lower leg 04/2016  . Borderline hypertension   . CAD (coronary artery disease)    a. nonobst by cor CT 12/2018.  Marland Kitchen CIN II (cervical intraepithelial neoplasia II)    CIN  1  . Eczema   . Family history of premature CAD   . History of basal cell carcinoma (BCC) excision    left upper arm 2017;   right lower leg 2018;  bilateral lower leg and hip area 2019;   inner right lower leg, outer right thigh, & upper outside left arm 09/ 2020  . History of cervical dysplasia 2013  . History of hereditary spherocytosis    s/p splenectomy in 1954  . History of hyperparathyroidism    per pt yrs ago was put on mega dose of vit d which caused the hyperparathyroid resolved after stopping vit d  . History of pelvic fracture 2013  . MAI (mycobacterium  avium-intracellulare) ([name])    ? possibly by CT 12/2018  . Mild hyperlipidemia   . Osteoporosis   . PONV (postoperative nausea and vomiting)   . S/P splenectomy    spherocytosis  . Wears glasses   . White coat syndrome without diagnosis of hypertension     Past Surgical History:  Procedure Laterality Date  . ANKLE SURGERY  03/14/2015   . CERVICAL CONIZATION W/BX N/A 12/13/2018   Procedure: CONIZATION CERVIX WITH BIOPSY;  Surgeon: Megan Salon, MD;  Location: [facility];  Service: Gynecology;  Laterality: N/A;  . FRACTURE SURGERY    . IR RADIOLOGY PERIPHERAL GUIDED IV START  01/05/2019  . IR US GUIDE VASC ACCESS RIGHT  01/05/2019  . KNEE SURGERY Left 1991   per pt retained hardward  . LEEP N/A 12/13/2018   Procedure: possible LOOP ELECTROSURGICAL EXCISION PROCEDURE (LEEP);  Surgeon: Megan Salon, MD;  Location: [facility];  Service: Gynecology;  Laterality: N/A;  . ORIF ANKLE FRACTURE Left 03/14/2015   per pt retained hardware  . SPLENECTOMY, TOTAL  1954   due to spherocytosis  . TUBAL LIGATION Bilateral 1995    Current Medications: Current Meds  Medication Sig  . aspirin EC 81 MG tablet Take 1 tablet (81 mg total) by mouth daily.  . clonazePAM (KLONOPIN) 0.5 MG tablet Take 1 tablet (0.5 mg total) by mouth daily as needed for anxiety.  Marland Kitchen escitalopram (LEXAPRO) 10 MG tablet Take 5 mg by mouth daily.  . halobetasol (ULTRAVATE) 0.05 % cream Apply topically as needed.  Marland Kitchen OVER THE COUNTER MEDICATION Fish Oil-Take 2 tablet (694) daily.  . rosuvastatin (CRESTOR) 20 MG tablet TAKE 1 TABLET BY MOUTH EVERY DAY     Allergies:   Penicillins and Sulfa antibiotics   Social History   Socioeconomic History  . Marital status: Widowed    Spouse name: Not on file  . Number of children: Not on file  . Years of education: Not on file  . Highest education level: Not on file  Occupational History  . Not on file  Tobacco Use  . Smoking status: Never Smoker  . Smokeless tobacco: Never Used  Vaping Use  . Vaping Use: Never used  Substance and Sexual Activity  . Alcohol use: Not Currently  . Drug use: No  . Sexual activity: Not Currently    Partners: Male    Birth control/protection: Surgical, Post-menopausal    Comment: BTL  Other Topics Concern  . Not on file  Social History Narrative  . Not on file   Social Determinants of Health   Financial Resource Strain:   . Difficulty of Paying Living Expenses:   Food Insecurity:   . Worried About Paediatric nurse in the Last Year:   . Arboriculturist in the Last Year:   Transportation Needs:   . Film/video editor (Medical):   Marland Kitchen Lack of Transportation (Non-Medical):   Physical Activity:   . Days of Exercise per Week:   . Minutes of Exercise per Session:   Stress:   . Feeling of Stress :   Social Connections:   . Frequency of Communication with Friends and Family:   . Frequency of Social Gatherings with Friends and Family:   . Attends Religious Services:   . Active Member of Clubs or Organizations:   . Attends Archivist Meetings:   Marland Kitchen Marital Status:      Family History:  The patient's family history includes Colon  cancer in her mother; Crohn's disease in her daughter; Heart disease in her father and sister; Heart failure in her father; Hypertension in her father; Osteoporosis in her mother; Skin cancer in her father.  ROS:   Please see the history of present illness.   All other systems are reviewed and otherwise negative.    EKGs/Labs/Other Studies Reviewed:    Studies reviewed were summarized above.   Coronary CTA 01/05/19 ADDENDUM REPORT: 01/06/2019 20:42  EXAM: CT FFR ANALYSIS  CLINICAL DATA:  69 year old female with h/o HTN, HLP and dyspnea on exertion.  FINDINGS: FFRct analysis was performed on the original cardiac CT angiogram dataset. Diagrammatic representation of the FFRct analysis is provided in a separate PDF document in PACS. This dictation was created using the PDF document and an interactive 3D model of the results. 3D model is not available in the EMR/PACS. Normal FFR range is >0.80.  1. Left Main:  No significant stenosis.  2. LAD: Proximal: 0.94, mid: 0.92, distal: 0.82. 3. LCX: No significant stenosis. 4. RCA: No significant stenosis.  IMPRESSION: 1. CT FFR analysis didn't show any significant stenosis. Aggressive medical management is recommended.   Electronically Signed   By: Ena Dawley   On: 01/06/2019  20:42   Addended by Dorothy Spark, MD on 01/06/2019 8:45 PM    ADDENDUM REPORT: 01/06/2019 20:35  CLINICAL DATA:  69 year old female with h/o HTN, HLP and dyspnea on exertion.  EXAM: Cardiac/Coronary  CTA  TECHNIQUE: The patient was scanned on a [employer].  FINDINGS: A 100 kV prospective scan was triggered in the descending thoracic aorta at 111 HU's. Axial non-contrast 3 mm slices were carried out through the heart. The data set was analyzed on a dedicated work station and scored using the [place]. Gantry rotation speed was 250 msecs and collimation was .6 mm. No beta blockade and 0.8 mg of sl NTG was given. The 3D data set was reconstructed in 5% intervals of the 67-82 % of the R-R cycle. Diastolic phases were analyzed on a dedicated work station using MPR, MIP and VRT modes. The patient received 80 cc of contrast.  Aorta: Normal size. Mild diffuse atherosclerotic plaque and calcifications. No dissection.  Aortic Valve:  Trileaflet.  Mild calcifications.  Coronary Arteries:  Normal coronary origin.  Right dominance.  RCA is a large dominant artery that gives rise to PDA and PLA. There is minimal calcified plaque in the ostium with stenosis 0-25%.  Left main is a large artery that gives rise to LAD and LCX arteries. Left main has minimal ostial plaque.  LAD is a large vessel that gives rise to one diagonal artery. LAD has diffuse moderate to severe calcified plaque in the proximal to mid portion, there are two moderate plaques with stenosis 50-69% followed by a focal stenosis that is possibly severe. Mid to distal LAD has minimal plaque.  D1 has minimal plaque.  LCX is a non-dominant artery that gives rise to one large OM1 branch. There is minimal plaque.  Other findings:  Normal pulmonary vein drainage into the left atrium.  Normal left atrial appendage without a thrombus.  Normal size of the pulmonary  artery.  IMPRESSION: 1. Coronary calcium score of 544. This was 16 percentile for age and sex matched control.  2. Normal coronary origin with right dominance.  3. Diffuse moderate to severe calcified plaque in the proximal to mid LAD with possibly severe stenosis in the mid LAD. Additional analysis with CT FFR will be  submitted.   Electronically Signed   By: Ena Dawley   On: 01/06/2019 20:35   Addended by Dorothy Spark, MD on 01/06/2019 8:37 PM    Study Result  EXAM: OVER-READ INTERPRETATION  CT CHEST  The following report is an over-read performed by radiologist Dr. Vinnie Langton of [facility], [place] on 01/05/2019. This over-read does not include interpretation of cardiac or coronary anatomy or pathology. The coronary calcium score/coronary CTA interpretation by the cardiologist is attached.  COMPARISON:  None.  FINDINGS: Aortic atherosclerosis. A few scattered calcified granulomas are noted. Within the visualized portions of the thorax there are no suspicious appearing pulmonary nodules or masses, there is no acute consolidative airspace disease, no pleural effusions, no pneumothorax and no lymphadenopathy. There are widespread areas of cylindrical and mild varicose bronchiectasis with associated thickening of the peribronchovascular interstitium, regional areas of architectural distortion and peribronchovascular micro nodularity most compatible with areas of chronic mucoid impaction within terminal bronchioles. Visualized portions of the upper abdomen are unremarkable. There are no aggressive appearing lytic or blastic lesions noted in the visualized portions of the skeleton.  IMPRESSION: 1. The appearance of the lungs suggests chronic indolent atypical infectious process such as mycobacterium avium intracellulare (MAI). Outpatient referral to Pulmonology for further evaluation is suggested. 2. Aortic  atherosclerosis.  Electronically Signed: By: Vinnie Langton M.D. On: 01/05/2019 13:27     2D echo 11/24/18 IMPRESSIONS  1. Left ventricular ejection fraction, by visual estimation, is 60 to 65%. The left ventricle has normal function. Normal left ventricular size. There is no left ventricular hypertrophy.  2. Elevated left ventricular end-diastolic pressure.  3. Left ventricular diastolic Doppler parameters are consistent with impaired relaxation pattern of LV diastolic filling.  4. Global right ventricle has normal systolic function.The right ventricular size is normal. No increase in right ventricular wall thickness.  5. Left atrial size was normal.  6. Right atrial size was normal.  7. The mitral valve is normal in structure. No evidence of mitral valve regurgitation. No evidence of mitral stenosis.  8. The tricuspid valve is normal in structure. Tricuspid valve regurgitation is mild.  9. The aortic valve was not well visualized Aortic valve regurgitation was not visualized by color flow Doppler. Structurally normal aortic valve, with no evidence of sclerosis or stenosis. 10. The pulmonic valve was normal in structure. Pulmonic valve regurgitation is not visualized by color flow Doppler. 11. Normal pulmonary artery systolic pressure. 12. The inferior vena cava is normal in size with greater than 50% respiratory variability, suggesting right atrial pressure of 3 mmHg.      EKG:  EKG was not ordered today.   Recent Labs: 10/20/2018: TSH 1.13 12/03/2018: B Natriuretic Peptide 103.4; BUN 19; Creatinine, Ser 0.75; Hemoglobin 14.2; Platelets 188; Potassium 4.2; Sodium 139 03/25/2019: ALT 27  Recent Lipid Panel    Component Value Date/Time   CHOL 135 03/25/2019 0829   TRIG 56 03/25/2019 0829   HDL 75 03/25/2019 0829   CHOLHDL 1.8 03/25/2019 0829   CHOLHDL 3 10/20/2018 1456   VLDL 13.2 10/20/2018 1456   LDLCALC 48 03/25/2019 0829    PHYSICAL EXAM:    VS:  BP (!) 142/80   Pulse  60   Ht 5' 3.4" (1.61 m)   Wt 141 lb 3.2 oz (64 kg)   LMP 02/24/2002   SpO2 97%   BMI 24.70 kg/m   BMI: Body mass index is 24.7 kg/m.  GEN: Well nourished, well developed F in no acute distress  HEENT: normocephalic, atraumatic Neck: no JVD, carotid bruits, or masses Cardiac: RRR; no murmurs, rubs, or gallops, no edema  Respiratory:  clear to auscultation bilaterally, normal work of breathing GI: soft, nontender, nondistended, + BS MS: no deformity or atrophy Skin: warm and dry, no rash Neuro:  Alert and Oriented x 3, Strength and sensation are intact, follows commands Psych: euthymic mood, full affect  Wt Readings from Last 3 Encounters:  09/08/19 141 lb 3.2 oz (64 kg)  08/05/19 140 lb (63.5 kg)  07/21/19 142 lb (64.4 kg)     ASSESSMENT & PLAN:   1. CAD -moderate nonobstructive, she is asymptomatic, will continue aspirin and rosuvastatin.   2. Hypertension with significant LVH and patient changes on her EKG, we will start losartan 25 mg daily and have her email [place] blood pressure diary and adjust as needed. 3. Hyperlipidemia -she is tolerating rosuvastatin well.  Her HDL is 75, LDL 48 and triglycerides 56.  Her normal. 4. Abnormal chest CT - discussed findings of possible MAI with patient. She has read up about this herself as well. At this time she wishes to defer pulmonology evaluation due to feeling overwhelmed. 5. Anxiety, she was started on Lexapro by her primary care physician and she feels significantly better.  Disposition: F/u with Dr. Meda Coffee in 6 months.  Medication Adjustments/Labs and Tests Ordered: Current medicines are reviewed at length with the patient today.  Concerns regarding medicines are outlined above. Medication changes, Labs and Tests ordered today are summarized above and listed in the Patient Instructions accessible in Encounters.   Signed, Ena Dawley, MD  09/08/2019 9:04 AM    [address], [place], [place]   [address] Phone: [phone]; Fax: [phone]

## 2019-09-26 ENCOUNTER — Ambulatory Visit: Payer: Medicare HMO | Admitting: Obstetrics & Gynecology

## 2019-09-29 ENCOUNTER — Encounter: Payer: Self-pay | Admitting: Obstetrics & Gynecology

## 2019-09-29 ENCOUNTER — Ambulatory Visit (INDEPENDENT_AMBULATORY_CARE_PROVIDER_SITE_OTHER): Payer: Medicare HMO | Admitting: Obstetrics & Gynecology

## 2019-09-29 ENCOUNTER — Other Ambulatory Visit: Payer: Self-pay

## 2019-09-29 VITALS — BP 110/64 | HR 68 | Resp 16 | Wt 137.0 lb

## 2019-09-29 DIAGNOSIS — B977 Papillomavirus as the cause of diseases classified elsewhere: Secondary | ICD-10-CM

## 2019-09-29 DIAGNOSIS — N889 Noninflammatory disorder of cervix uteri, unspecified: Secondary | ICD-10-CM

## 2019-09-29 DIAGNOSIS — E7800 Pure hypercholesterolemia, unspecified: Secondary | ICD-10-CM

## 2019-09-29 DIAGNOSIS — N871 Moderate cervical dysplasia: Secondary | ICD-10-CM

## 2019-09-29 NOTE — Progress Notes (Signed)
69 y.o. [id number] Widowed Other or two or more races female here for discussion of upcoming procedure.  TLH/BSO/cystoscopy planned due to recurrent/persistent cervical dysplasia.  She's had abnormal pap smears for about 8 years that started after acquiring high risk HPV from a sexual partner.  She has undergone two LEEP procedures in 2017 with CIN 2, negative margins and then 10/20 which showed inflammation but prior ECC showed CIN 2.  So, she is aware I am concerned there is pathology in her cervical canal.  The cervix is now very small and there is really no ability to perform another LEEP procedure.  Most recent pap smear on 07/21/2019 showed epithelial atypia with continued presence of high risk HPV.  We have discussed this procedure extensively and she has decided to proceed with definitive treatment with hysterectomy.  We have discussed BSO as well.  Today she has many questions about this and is thinking about keeping her ovaries.  She is aware that there is no evidence about benefit of keeping her ovaries and they still will be a cancer risk.  She is ok with removal of her fallopian tubes.    Procedure discussed with patient.  Hospital stay, recovery and pain management all discussed.  Risks discussed including but not limited to bleeding, 1% risk of receiving a  transfusion, infection, 3-4% risk of bowel/bladder/ureteral/vascular injury discussed as well as possible need for additional surgery if injury does occur discussed.  DVT/PE and rare risk of death discussed.  My actual complications with prior surgeries discussed.  Vaginal cuff dehiscence discussed.  Hernia formation discussed.  Positioning and incision locations discussed.  Patient aware if pathology abnormal she may need additional treatment.  Post procedure cystoscopy will be performed as well and this was discussed.  All questions answered.    Lastly, pt does have increased risk for CVD due to high coronary calcium score noted on coronary  CT.  She is followed by Dr. Meda Coffee.  Pt has been eating the Mediterranean diet and has lost about 10 pounds.  She did see Dr. Meda Coffee 09/08/2019.  Pt reports she discussed the upcoming surgery.  There is no mention of this in the note.  Pt aware will need cardiac clearance and if cannot be obtained without another OV, that will be necessary.  Ob Hx:   Patient's last menstrual period was 02/24/2002.          Sexually active: No. Birth control: PMP Last pap:  06/2019 with epithelial atypia, +HR HPV Last MMG: 06/2019 Tobacco: no smoker Transfusion hx:  H/o splenectomy and had blood transfusion then  Past Surgical History:  Procedure Laterality Date  . ANKLE SURGERY  03/14/2015   . CERVICAL CONIZATION W/BX N/A 12/13/2018   Procedure: CONIZATION CERVIX WITH BIOPSY;  Surgeon: Megan Salon, MD;  Location: [facility];  Service: Gynecology;  Laterality: N/A;  . FRACTURE SURGERY    . IR RADIOLOGY PERIPHERAL GUIDED IV START  01/05/2019  . IR US GUIDE VASC ACCESS RIGHT  01/05/2019  . KNEE SURGERY Left 1991   per pt retained hardward  . LEEP N/A 12/13/2018   Procedure: possible LOOP ELECTROSURGICAL EXCISION PROCEDURE (LEEP);  Surgeon: Megan Salon, MD;  Location: [place];  Service: Gynecology;  Laterality: N/A;  . ORIF ANKLE FRACTURE Left 03/14/2015   per pt retained hardware  . SPLENECTOMY, TOTAL  1954   due to spherocytosis  . TUBAL LIGATION Bilateral 1995    Past Medical History:  Diagnosis Date  . Anemia   .  Anxiety disorder   . Basal cell carcinoma (BCC) of left upper arm 12/2015  . Basal cell carcinoma (BCC) of right lower leg 04/2016  . Borderline hypertension   . CAD (coronary artery disease)    a. nonobst by cor CT 12/2018.  Marland Kitchen CIN II (cervical intraepithelial neoplasia II)    CIN  1  . Eczema   . Family history of premature CAD   . History of basal cell carcinoma (BCC) excision    left upper arm 2017;   right lower leg 2018;  bilateral lower leg and hip area 2019;   inner right lower  leg, outer right thigh, & upper outside left arm 09/ 2020  . History of cervical dysplasia 2013  . History of hereditary spherocytosis    s/p splenectomy in 1954  . History of hyperparathyroidism    per pt yrs ago was put on mega dose of vit d which caused the hyperparathyroid resolved after stopping vit d  . History of pelvic fracture 2013  . MAI (mycobacterium avium-intracellulare) ([place])    ? possibly by CT 12/2018  . Mild hyperlipidemia   . Osteoporosis   . PONV (postoperative nausea and vomiting)   . S/P splenectomy    spherocytosis  . Wears glasses   . White coat syndrome without diagnosis of hypertension     Allergies: Penicillins and Sulfa antibiotics  Current Outpatient Medications  Medication Sig Dispense Refill  . aspirin EC 81 MG tablet Take 1 tablet (81 mg total) by mouth daily. 90 tablet 3  . clonazePAM (KLONOPIN) 0.5 MG tablet Take 1 tablet (0.5 mg total) by mouth daily as needed for anxiety. 30 tablet 2  . escitalopram (LEXAPRO) 5 MG tablet Take 5 mg by mouth daily.     . halobetasol (ULTRAVATE) 0.05 % cream Apply 1 application topically daily as needed (irritation).     Marland Kitchen losartan (COZAAR) 25 MG tablet Take 1 tablet (25 mg total) by mouth daily. 90 tablet 2  . Omega 3-6-9 Fatty Acids (OMEGA-3-6-9 PO) Take 2 capsules by mouth daily. 694 mg    . rosuvastatin (CRESTOR) 20 MG tablet TAKE 1 TABLET BY MOUTH EVERY DAY (Patient taking differently: Take 20 mg by mouth daily. ) 90 tablet 1   No current facility-administered medications for this visit.    ROS: A comprehensive review of systems was negative.  Exam:    BP 110/64   Pulse 68   Resp 16   Wt 137 lb (62.1 kg)   LMP 02/24/2002   BMI 23.96 kg/m   General appearance: alert and cooperative Head: Normocephalic, without obvious abnormality, atraumatic Neck: no adenopathy, supple, symmetrical, trachea midline and thyroid not enlarged, symmetric, no tenderness/mass/nodules Lungs: clear to auscultation  bilaterally Heart: regular rate and rhythm, S1, S2 normal, no murmur, click, rub or gallop Abdomen: soft, non-tender; bowel sounds normal; no masses,  no organomegaly Extremities: extremities normal, atraumatic, no cyanosis or edema Skin: Skin color, texture, turgor normal. No rashes or lesions Lymph nodes: Cervical, supraclavicular, and axillary nodes normal. no inguinal nodes palpated Neurologic: Grossly normal  Pelvic: External genitalia:  no lesions              Urethra: normal appearing urethra with no masses, tenderness or lesions              Bartholins and Skenes: Bartholin's, Urethra, Skene's normal                 Vagina: normal appearing vagina with normal color  and discharge, no lesions              Cervix: cervix essentially flushed with vaginal apex              Pap taken: No.        Bimanual Exam:  Uterus:  uterus is normal size, shape, consistency and nontender                                      Adnexa:    no masses                                      Rectovaginal: Deferred                                      Anus:  defer exam  Chaperone, Royal Hawthorn, present for examination.   A: Persistent high risk cervical dysplsia with CIN 2 on ECC 12/20 Persistent HR HPV Increased coronary calcium score H/o hereditary spherocytosis s/p splenectomy as a child Borderline hypertension Elevated lipids Anxiety  P:  TLH/bilateral salpingectomy/cystoscopy planned Medications/Vitamins reviewed.  Pt knows needs to stop ASA 5 days before. Message to Dr. Meda Coffee about preoperative clearance sent today via Epic.  Pt may need another appt but hopefully not. Pre op planning discussed Post op stay, pain management discussed

## 2019-09-30 NOTE — Chronic Care Management (AMB) (Signed)
Chronic Care Management Pharmacy  Name: Alicia Wilkerson  MRN: [id number] DOB: May 25, 1950   Chief Complaint/ HPI  Alicia Wilkerson,  69 y.o. , female presents for their Initial CCM visit with the clinical pharmacist via telephone due to COVID-19 Pandemic.  PCP : Binnie Rail, MD Patient Care Team: Binnie Rail, MD as PCP - General (Internal Medicine) Dorothy Spark, MD as PCP - Cardiology (Cardiology) Charlton Haws, [facility] as Pharmacist (Pharmacist) Megan Salon, MD as Consulting Physician (Gynecology)  Their chronic conditions include: Hypertension, Hyperlipidemia, Coronary Artery Disease and Anxiety   Pt is from [place], moved to [place] 17 years ago. She has twin sons, living in [place] and [place], and a daughter in [place] who was recently diagnosed with Crohn's disease. Her husband died of pancreatic cancer in 05-20-1994.  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 She just started a mediterranean diet, losing weight, walking most days. She does household projects, spends time with her friends in the area - going out to dinner. She had wanted to travel in her retirement but most plans on hold since pandemic. She was very happy not being on medication all her life until she started BP and cholesterol meds recently. She reports a strong family history of heart disease with parents and grandparents dying from heart attacks, and sister with CAD.   Office Visits: 10/20/18 Dr Quay Burow OV: f/u for anxiety, worried about kids. Counseled on clonazepam refills, rec'd counseling or SSRI  02/14/20 pt message: started escitalopram for anxiety.  Consult Visit: 09/08/19 Dr Meda Coffee (cardiology): started losartan for HTN, LVH on EKG  08/05/19 Dr (OB/GYN): high risk HPV, hx abnormal PAP, likely proceeding for hysterectomy  Allergies  Allergen Reactions  . Penicillins Rash    Did it involve swelling of the face/tongue/throat, SOB, or low BP? No Did it involve sudden or severe rash/hives, skin peeling, or  any reaction on the inside of your mouth or nose?  #  #  #  YES  #  #  #  Did you need to seek medical attention at a hospital or doctor's office? #  #  #  YES  #  #  #  When did it last happen?years ago If all above answers are "NO", may proceed with cephalosporin use.   . Sulfa Antibiotics Hives    Medications: Outpatient Encounter Medications as of 10/03/2019  Medication Sig  . aspirin EC 81 MG tablet Take 1 tablet (81 mg total) by mouth daily.  . clonazePAM (KLONOPIN) 0.5 MG tablet Take 1 tablet (0.5 mg total) by mouth daily as needed for anxiety.  Marland Kitchen escitalopram (LEXAPRO) 5 MG tablet Take 5 mg by mouth daily.   . halobetasol (ULTRAVATE) 0.05 % cream Apply 1 application topically daily as needed (irritation).   Marland Kitchen losartan (COZAAR) 25 MG tablet Take 1 tablet (25 mg total) by mouth daily.  Ernestine Conrad 3-6-9 Fatty Acids (OMEGA-3-6-9 PO) Take 2 capsules by mouth daily. 694 mg  . rosuvastatin (CRESTOR) 20 MG tablet TAKE 1 TABLET BY MOUTH EVERY DAY (Patient taking differently: Take 20 mg by mouth daily. )   No facility-administered encounter medications on file as of 10/03/2019.     Current Diagnosis/Assessment:  SDOH Interventions     Most Recent Value  SDOH Interventions  Financial Strain Interventions Intervention Not Indicated      Goals Addressed            This Visit's Progress   .  Pharmacy Care Plan       CARE PLAN ENTRY (see longitudinal plan of care for additional care plan information)  Current Barriers:  . Chronic Disease Management support, education, and care coordination needs related to Hypertension, Hyperlipidemia, Coronary Artery Disease, and Anxiety   Hypertension BP Readings from Last 3 Encounters:  09/29/19 110/64  09/08/19 (!) 142/80  08/05/19 118/78 .  Pharmacist Clinical Goal(s): o Over the next 180 days, patient will work with PharmD and providers to maintain BP goal <130/80 . Current regimen:  o Losartan 25 mg  daily . Interventions: o Discussed BP goals and benefits of medication for prevention of heart attack / stroke . Patient self care activities - Over the next 180 days, patient will: o Check BP 90, document, and provide at future appointments o Ensure daily salt intake < 2300 mg/day  Hyperlipidemia / CAD Lab Results  Component Value Date/Time   LDLCALC 48 03/25/2019 08:29 AM .  Pharmacist Clinical Goal(s): o Over the next 180 days, patient will work with PharmD and providers to maintain LDL goal < 70 . Current regimen:  o Rosuvastatin 20 mg daily o Aspirin 81 mg daily o OTC omega 3  - 2 capsules daily . Interventions: o Discussed cholesterol goals and benefits of medication for prevention of heart attack / stroke . Patient self care activities - Over the next 180 days, patient will: o Continue current medications o Continue exercise routine and diet  Anxiety . Pharmacist Clinical Goal(s) o Over the next 180 days, patient will work with PharmD and providers to optimize therapy . Current regimen:  o Escitalopram 5 mg daily o Clonazepam 0.5 mg daily PRN . Interventions: o Discussed benefits of escitalopram for anxiety management, and role of clonazepam for breakthrough anxiety o Discussed ability to increase escitalopram dose if needed in the future . Patient self care activities - Over the next 180 days, patient will: o Continue medication as prescribed o Follow up with PCP as scheduled  Medication management . Pharmacist Clinical Goal(s): o Over the next 180 days, patient will work with PharmD and providers to maintain optimal medication adherence . Current [facility] . Interventions o Comprehensive medication review performed. o Continue current medication management strategy . Patient self care activities - Over the next 180 days, patient will: o Focus on medication adherence by fill date o Take medications as prescribed o Report any questions or concerns to PharmD  and/or provider(s)  Initial goal documentation      Hypertension   BP goal is:  <130/80  Office blood pressures are  BP Readings from Last 3 Encounters:  09/29/19 110/64  09/08/19 (!) 142/80  08/05/19 118/78   Kidney Function Lab Results  Component Value Date/Time   CREATININE 0.75 12/03/2018 06:56 AM   CREATININE 0.70 10/20/2018 02:56 PM   CREATININE 0.76 04/28/2014 11:24 AM   GFR 83.59 10/20/2018 02:56 PM   GFRNONAA >60 12/03/2018 06:56 AM   GFRAA >60 12/03/2018 06:56 AM   K 4.2 12/03/2018 06:56 AM   K 4.3 10/20/2018 02:56 PM    Patient checks BP at home TID Patient home BP readings are ranging: 96/59 - 141/85  Patient has failed these meds in the past: n/a Patient is currently controlled on the following medications:  . Losartan 25 mg daily (started 09/08/19)  We discussed diet and exercise extensively; pt reports she has coffee in AM, usually doesn't eat a meal until late afternoon. She denies side effects from losartan (just started a few  weeks ago). Discussed BP goals, benefits of losartan for heart and kidneys.  Plan  Continue current medications and control with diet and exercise   Hyperlipidemia   LDL goal < 70 CAD on coronary CT, coronary calcium 544 (high risk)  Lipid Panel     Component Value Date/Time   CHOL 135 03/25/2019 0829   TRIG 56 03/25/2019 0829   HDL 75 03/25/2019 0829   LDLCALC 48 03/25/2019 0829    Hepatic Function Latest Ref Rng & Units 03/25/2019 10/20/2018 10/13/2017  Total Protein 6.0 - 8.5 g/dL 7.0 7.7 7.6  Albumin 3.8 - 4.8 g/dL 4.5 4.5 4.4  AST 0 - 40 IU/L 32 28 28  ALT 0 - 32 IU/L '27 27 28  ' Alk Phosphatase 39 - 117 IU/L 115 95 89  Total Bilirubin 0.0 - 1.2 mg/dL 1.0 1.1 1.2  Bilirubin, Direct 0.00 - 0.40 mg/dL 0.30 - -    The 10-year ASCVD risk score Mikey Bussing DC Jr., et al., 2013) is: 3.9%   Values used to calculate the score:     Age: 56 years     Sex: Female     Is Non-Hispanic African American: No     Diabetic: No      Tobacco smoker: No     Systolic Blood Pressure: 915 mmHg     Is BP treated: No     HDL Cholesterol: 75 mg/dL     Total Cholesterol: 135 mg/dL   Patient has failed these meds in past: n/a Patient is currently controlled on the following medications:  . Rosuvastatin 20 mg daily . Aspirin 81 mg daily . OTC omega 3 - 2 capsules daily, 6 mos  We discussed:  diet and exercise extensively; cholesterol goals; benefits of statin; pt is following mediterranean diet; she is concerned about her CV health because of strong family history and coronary calcium score; discussed ASCVD risk reduction at length.  Plan  Continue current medications and control with diet and exercise  Anxiety   No flowsheet data found.  Patient has failed these meds in past: bupropion Patient is currently controlled on the following medications:  . Escitalopram 5 mg daily (@ [facility] for cost savings) . Clonazepam 0.5 mg daily PRN  We discussed:  Pt reports she has been on clonazepam since early adulthood; had a hard time after her husband died and she was single mother of 3. Anxiety has improved since starting escitolopram. Uses clonazepam for "situational stress" usually 1-2 per month.   Plan  Continue current medications  Osteoporosis   Last DEXA Scan: 01/05/2015  T-Score femoral neck:   T-Score total hip: -1.6  T-Score lumbar spine: -3.1  T-Score forearm radius: n/a  10-year probability of major osteoporotic fracture: n/a  10-year probability of hip fracture: n/a  Vit D, 25-Hydroxy  Date Value Ref Range Status  08/21/2016 27.4 (L) 30.0 - 100.0 ng/mL Final   Patient is a candidate for pharmacologic treatment due to T-Score < -2.5 in lumbar spine  Patient has failed these meds in past: calcium, vitamin D Patient is currently controlled on the following medications:  . No medications  We discussed:  Recommend weight-bearing and muscle strengthening exercises for building and maintaining bone density.   Pt reports she has tried alendronate before. She declines calcium or vitamin D because she reports she has taken prescription Vitamin D before, but found she has a parathyroid issue so her levels did not improve. Discussed Prolia for treatment of osteoporosis, pt would like to research  it herself and discuss with PCP  Plan  Continue control with diet and exercise  Recommend Prolia 60 mg q6 months - pt wants to discuss w/ PCP  Eczema   Patient is currently controlled on the following medications:  . Halobetasol cream 0.05%  We discussed:  Uses for flares, which usually occurs on hands.   Plan  Continue current medications   Medication Management   [facility] Uses pill box? No - prefers bottles Pt endorses 100% compliance  We discussed: Pt is satisfied with pharmacy services; she gets escitalopram at [facility] for cost savings since her insurance labels it Tier 3.   Plan  Continue current medication management strategy    Follow up: 6 month phone visit  Charlene Brooke, PharmD, [facility] Clinical Pharmacist [facility] [phone]

## 2019-10-02 ENCOUNTER — Other Ambulatory Visit: Payer: Self-pay | Admitting: Obstetrics & Gynecology

## 2019-10-03 ENCOUNTER — Telehealth: Payer: Self-pay

## 2019-10-03 ENCOUNTER — Ambulatory Visit: Payer: Medicare HMO | Admitting: Pharmacist

## 2019-10-03 ENCOUNTER — Telehealth: Payer: Self-pay | Admitting: Cardiology

## 2019-10-03 ENCOUNTER — Other Ambulatory Visit: Payer: Self-pay | Admitting: Obstetrics & Gynecology

## 2019-10-03 ENCOUNTER — Other Ambulatory Visit: Payer: Self-pay

## 2019-10-03 DIAGNOSIS — I2510 Atherosclerotic heart disease of native coronary artery without angina pectoris: Secondary | ICD-10-CM

## 2019-10-03 DIAGNOSIS — R030 Elevated blood-pressure reading, without diagnosis of hypertension: Secondary | ICD-10-CM

## 2019-10-03 DIAGNOSIS — E7849 Other hyperlipidemia: Secondary | ICD-10-CM

## 2019-10-03 DIAGNOSIS — F411 Generalized anxiety disorder: Secondary | ICD-10-CM

## 2019-10-03 NOTE — Telephone Encounter (Signed)
° °  Primary Cardiologist: Ena Dawley, MD  Chart reviewed as part of pre-operative protocol coverage. Given past medical history and time since last visit, based on ACC/AHA guidelines, Alicia Wilkerson would be at acceptable risk for the planned procedure without further cardiovascular testing.   Patient not taking any blood thinning medications that need to be held at this time.  I will route this recommendation to the requesting party via Epic fax function and remove from pre-op pool.  Please call with questions.  [name]. [name] NP-C    10/03/2019, 3:25 PM [address] Office [phone] Fax [phone]

## 2019-10-03 NOTE — Patient Instructions (Addendum)
Get you Covid test at [facility] in [place] on 10/14/19 at: 3:00 pm   RASHIYA LOFLAND      Your procedure is scheduled on 10/18/19   Report to [facility]  At  5:30   A.M.   Call this number if you have problems the morning of surgery:[phone]   OUR ADDRESS IS [address], WE ARE LOCATED IN THE [facility]ICAL [facility] WITH [facility].   Remember:  Do not eat food or drink liquids after midnight.   Take these medicines the morning of surgery with A SIP OF WATER: Lexapro, Klonopin if needed   Do not wear jewelry, make-up or nail polish.  Do not wear lotions, powders, or perfumes, or deoderant.  Do not shave 48 hours prior to surgery.    Do not bring valuables to the hospital.  [facility] is not responsible for any belongings or valuables.  Contacts, dentures or bridgework may not be worn into surgery.    For patients admitted to the hospital, discharge time will be determined by your treatment team.  Patients discharged the day of surgery will not be allowed to drive home.   Special instructions:    Please read over the following fact sheets that you were given:    [facility] - Preparing for Surgery Before surgery, you can play an important role.   Because skin is not sterile, your skin needs to be as free of germs as possible .  You can reduce the number of germs on your skin by washing with CHG (chlorahexidine gluconate) soap before surgery.   CHG is an antiseptic cleaner which kills germs and bonds with the skin to continue killing germs even after washing. Please DO NOT use if you have an allergy to CHG or antibacterial soaps.   If your skin becomes reddened/irritated stop using the CHG and inform your nurse when you arrive at Short Stay. Do not shave (including legs and underarms) for at least 48 hours prior to the first CHG shower.   . Please follow these instructions carefully:  1.  Shower with CHG Soap the night before  surgery and the  morning of Surgery.  2.  If you choose to wash your hair, wash your hair first as usual with your  normal  shampoo.  3.  After you shampoo, rinse your hair and body thoroughly to remove the  shampoo.                                        4.  Use CHG as you would any other liquid soap.  You can apply chg directly  to the skin and wash                       Gently with a scrungie or clean washcloth.  5.  Apply the CHG Soap to your body ONLY FROM THE NECK DOWN.   Do not use on face/ open                           Wound or open sores. Avoid contact with eyes, ears mouth and genitals (private parts).                       Wash face,  Genitals (private parts) with  your normal soap.             6.  Wash thoroughly, paying special attention to the area where your surgery  will be performed.  7.  Thoroughly rinse your body with warm water from the neck down.  8.  DO NOT shower/wash with your normal soap after using and rinsing off  the CHG Soap.             9.  Pat yourself dry with a clean towel.            10.  Wear clean pajamas.            11.  Place clean sheets on your bed the night of your first shower and do not  sleep with pets. Day of Surgery : Do not apply any lotions/deodorants the morning of surgery.  Please wear clean clothes to the hospital/surgery center.  FAILURE TO FOLLOW THESE INSTRUCTIONS MAY RESULT IN THE CANCELLATION OF YOUR SURGERY PATIENT SIGNATURE_________________________________  NURSE SIGNATURE__________________________________  ________________________________________________________________________

## 2019-10-03 NOTE — Telephone Encounter (Signed)
       [facility] Pre-operative Risk Assessment    HEARTCARE STAFF: - Please ensure there is not already an duplicate clearance open for this procedure. - Under Visit Info/Reason for Call, type in Other and utilize the format Clearance MM/DD/YY or Clearance TBD. Do not use dashes or single digits. - If request is for dental extraction, please clarify the # of teeth to be extracted.  Request for surgical clearance:  1. What type of surgery is being performed? Total laparoscopic hysterectomy, removal of her tube and possible her ovaries  2. When is this surgery scheduled? 10/18/2019   3. What type of clearance is required (medical clearance vs. Pharmacy clearance to hold med vs. Both)? Both  4. Are there any medications that need to be held prior to surgery and how long? Any neccessary medication pt needs to hold prior surgery  5. Practice name and name of physician performing surgery? Dr. Hale Bogus, [facility]  6. What is the office phone number? [phone]   7.   What is the office fax number? [phone]  8.   Anesthesia type (None, local, MAC, general) ? General   Alicia Wilkerson 10/03/2019, 2:35 PM  _________________________________________________________________   (provider comments below)

## 2019-10-03 NOTE — Telephone Encounter (Signed)
Please see note below from Dr.Turner regarding cardiac clearance for patient's surgery on 10/18/2019. Patient participates in regular exercise without chest pain or SOB. Dr.Miller notified of clearance.  ----- Message -----  From: Sueanne Margarita, MD  Sent: 09/30/2019  8:02 PM EDT  To: Nuala Alpha, LPN  Subject: RE: pre op clearance               Dr. Meda Coffee is on vacation and I am covering her in basket.   I have reviewed patient's recent appt with Dr. Meda Coffee at which time she was doing well without any angina. She has nonobstructive moderate proximal LAD disease with no evidence of flow reduction by a FFR a year ago. Based on the Revised Cardiac Risk Index she is at moderate risk with a 6.6% risk of major perioperative MI, Pulmonary edema, Vfib arrest or complete heart block given her underlying CAD.    If the patient is able to walk 4 blocks or 2 flights of stairs without any chest pain or SOB then ok to proceed with surgery. Please call patient to verify this.   Fransico Him

## 2019-10-03 NOTE — Patient Instructions (Addendum)
Visit Information  Phone number for Pharmacist: [phone]  Thank you for meeting with me to discuss your medications! I look forward to working with you to achieve your health care goals. Below is a summary of what we talked about during the visit:  Goals Addressed            This [place] (see longitudinal plan of care for additional care plan information)  Current Barriers:   Chronic Disease Management support, education, and care coordination needs related to Hypertension, Hyperlipidemia, Coronary Artery Disease, and Anxiety   Hypertension BP Readings from Last 3 Encounters:  09/29/19 110/64  09/08/19 (!) 142/80  08/05/19 118/78   Pharmacist Clinical Goal(s): o Over the next 180 days, patient will work with PharmD and providers to maintain BP goal <130/80  Current regimen:  o Losartan 25 mg daily  Interventions: o Discussed BP goals and benefits of medication for prevention of heart attack / stroke  Patient self care activities - Over the next 180 days, patient will: o Check BP 90, document, and provide at future appointments o Ensure daily salt intake < 2300 mg/day  Hyperlipidemia / CAD Lab Results  Component Value Date/Time   LDLCALC 48 03/25/2019 08:29 AM   Pharmacist Clinical Goal(s): o Over the next 180 days, patient will work with PharmD and providers to maintain LDL goal < 70  Current regimen:  o Rosuvastatin 20 mg daily o Aspirin 81 mg daily o OTC omega 3  - 2 capsules daily  Interventions: o Discussed cholesterol goals and benefits of medication for prevention of heart attack / stroke  Patient self care activities - Over the next 180 days, patient will: o Continue current medications o Continue exercise routine and diet  Anxiety  Pharmacist Clinical Goal(s) o Over the next 180 days, patient will work with PharmD and providers to optimize therapy  Current regimen:  o Escitalopram 5 mg  daily o Clonazepam 0.5 mg daily PRN  Interventions: o Discussed benefits of escitalopram for anxiety management, and role of clonazepam for breakthrough anxiety o Discussed ability to increase escitalopram dose if needed in the future  Patient self care activities - Over the next 180 days, patient will: o Continue medication as prescribed o Follow up with PCP as scheduled  Medication management  Pharmacist Clinical Goal(s): o Over the next 180 days, patient will work with PharmD and providers to maintain optimal medication adherence  Current pharmacy: [facility]  Interventions o Comprehensive medication review performed. o Continue current medication management strategy  Patient self care activities - Over the next 180 days, patient will: o Focus on medication adherence by fill date o Take medications as prescribed o Report any questions or concerns to PharmD and/or provider(s)  Initial goal documentation       Ms. Menden was given information about Chronic Care Management services today including:  1. CCM service includes personalized support from designated clinical staff supervised by her physician, including individualized plan of care and coordination with other care providers 2. 24/7 contact phone numbers for assistance for urgent and routine care needs. 3. Standard insurance, coinsurance, copays and deductibles apply for chronic care management only during months in which we provide at least 20 minutes of these services. Most insurances cover these services at 100%, however patients may be responsible for any copay, coinsurance and/or deductible if applicable. This service may help you avoid the need for more expensive  face-to-face services. 4. Only one practitioner may furnish and bill the service in a calendar month. 5. The patient may stop CCM services at any time (effective at the end of the month) by phone call to the office staff.  Patient agreed to services and verbal  consent obtained.   Patient verbalizes understanding of instructions provided today.  Telephone follow up appointment with pharmacy team member scheduled for: 6 months  Charlene Brooke, PharmD Clinical Pharmacist [facility] protect organs, store calcium, anchor muscles, and support the whole body. Keeping your bones strong is important, especially as you get older. You can take actions to help keep your bones strong and healthy. Why is keeping my bones healthy important?  Keeping your bones healthy is important because your body constantly replaces bone cells. Cells get old, and new cells take their place. As we age, we lose bone cells because the body may not be able to make enough new cells to replace the old cells. The amount of bone cells and bone tissue you have is referred to as bone mass. The higher your bone mass, the stronger your bones. The aging process leads to an overall loss of bone mass in the body, which can increase the likelihood of:  Joint pain and stiffness.  Broken bones.  A condition in which the bones become weak and brittle (osteoporosis). A large decline in bone mass occurs in older adults. In women, it occurs about the time of menopause. What actions can I take to keep my bones healthy? Good health habits are important for maintaining healthy bones. This includes eating nutritious foods and exercising regularly. To have healthy bones, you need to get enough of the right minerals and vitamins. Most nutrition experts recommend getting these nutrients from the foods that you eat. In some cases, taking supplements may also be recommended. Doing certain types of exercise is also important for bone health. What are the nutritional recommendations for healthy bones?  Eating a well-balanced diet with plenty of calcium and vitamin D will help to protect your bones. Nutritional recommendations vary from person to  person. Ask your health care provider what is healthy for you. Here are some general guidelines. Get enough calcium Calcium is the most important (essential) mineral for bone health. Most people can get enough calcium from their diet, but supplements may be recommended for people who are at risk for osteoporosis. Good sources of calcium include:  Dairy products, such as low-fat or nonfat milk, cheese, and yogurt.  Dark green leafy vegetables, such as bok choy and broccoli.  Calcium-fortified foods, such as orange juice, cereal, bread, soy beverages, and tofu products.  Nuts, such as almonds. Follow these recommended amounts for daily calcium intake:  Children, age 7-3: 700 mg.  Children, age 33-8: 1,000 mg.  Children, age 20-13: 1,300 mg.  Teens, age 34-18: 1,300 mg.  Adults, age 33-50: 1,000 mg.  Adults, age 75-70: ? Men: 1,000 mg. ? Women: 1,200 mg.  Adults, age 84 or older: 1,200 mg.  Pregnant and breastfeeding females: ? Teens: 1,300 mg. ? Adults: 1,000 mg. Get enough vitamin D Vitamin D is the most essential vitamin for bone health. It helps the body absorb calcium. Sunlight stimulates the skin to make vitamin D, so be sure to get enough sunlight. If you live in a cold climate or you do not get outside often, your health care provider may recommend that you take vitamin D supplements. Good  sources of vitamin D in your diet include:  Egg yolks.  Saltwater fish.  Milk and cereal fortified with vitamin D. Follow these recommended amounts for daily vitamin D intake:  Children and teens, age 31-18: 600 international units.  Adults, age 66 or younger: 400-800 international units.  Adults, age 46 or older: 800-1,000 international units. Get other important nutrients Other nutrients that are important for bone health include:  Phosphorus. This mineral is found in meat, poultry, dairy foods, nuts, and legumes. The recommended daily intake for adult men and adult women is  700 mg.  Magnesium. This mineral is found in seeds, nuts, dark green vegetables, and legumes. The recommended daily intake for adult men is 400-420 mg. For adult women, it is 310-320 mg.  Vitamin K. This vitamin is found in green leafy vegetables. The recommended daily intake is 120 mg for adult men and 90 mg for adult women. What type of physical activity is best for building and maintaining healthy bones? Weight-bearing and strength-building activities are important for building and maintaining healthy bones. Weight-bearing activities cause muscles and bones to work against gravity. Strength-building activities increase the strength of the muscles that support bones. Weight-bearing and muscle-building activities include:  Walking and hiking.  Jogging and running.  Dancing.  Gym exercises.  Lifting weights.  Tennis and racquetball.  Climbing stairs.  Aerobics. Adults should get at least 30 minutes of moderate physical activity on most days. Children should get at least 60 minutes of moderate physical activity on most days. Ask your health care provider what type of exercise is best for you. How can I find out if my bone mass is low? Bone mass can be measured with an X-ray test called a bone mineral density (BMD) test. This test is recommended for all women who are age 54 or older. It may also be recommended for:  Men who are age 70 or older.  People who are at risk for osteoporosis because of: ? Having bones that break easily. ? Having a long-term disease that weakens bones, such as kidney disease or rheumatoid arthritis. ? Having menopause earlier than normal. ? Taking medicine that weakens bones, such as steroids, thyroid hormones, or hormone treatment for breast cancer or prostate cancer. ? Smoking. ? Drinking three or more alcoholic drinks a day. If you find that you have a low bone mass, you may be able to prevent osteoporosis or further bone loss by changing your diet and  lifestyle. Where can I find more information? For more information, check out the following websites:  [place]: AviationTales.fr  [employer] of Health: www.bones.SouthExposed.es  International Osteoporosis Foundation: Administrator.iofbonehealth.org Summary  The aging process leads to an overall loss of bone mass in the body, which can increase the likelihood of broken bones and osteoporosis.  Eating a well-balanced diet with plenty of calcium and vitamin D will help to protect your bones.  Weight-bearing and strength-building activities are also important for building and maintaining strong bones.  Bone mass can be measured with an X-ray test called a bone mineral density (BMD) test. This information is not intended to replace advice given to you by your health care provider. Make sure you discuss any questions you have with your health care provider. Document Revised: 03/09/2017 Document Reviewed: 03/09/2017 Elsevier Patient Education  2020 [employer].

## 2019-10-04 ENCOUNTER — Encounter (HOSPITAL_COMMUNITY): Payer: Self-pay

## 2019-10-04 ENCOUNTER — Encounter (HOSPITAL_COMMUNITY)
Admission: RE | Admit: 2019-10-04 | Discharge: 2019-10-04 | Disposition: A | Discharge status: Home / Self Care | Payer: Medicare HMO | Source: Ambulatory Visit | Attending: Obstetrics & Gynecology | Admitting: Obstetrics & Gynecology

## 2019-10-04 ENCOUNTER — Other Ambulatory Visit: Payer: Self-pay

## 2019-10-04 DIAGNOSIS — Z01812 Encounter for preprocedural laboratory examination: Secondary | ICD-10-CM | POA: Diagnosis present

## 2019-10-04 HISTORY — DX: Depression, unspecified: F32.A

## 2019-10-04 LAB — CBC
HCT: 36 % (ref 36.0–46.0)
Hemoglobin: 12.8 g/dL (ref 12.0–15.0)
MCH: 35 pg — ABNORMAL HIGH (ref 26.0–34.0)
MCHC: 35.6 g/dL (ref 30.0–36.0)
MCV: 98.4 fL (ref 80.0–100.0)
Platelets: 316 K/uL (ref 150–400)
RBC: 3.66 MIL/uL — ABNORMAL LOW (ref 3.87–5.11)
RDW: 17 % — ABNORMAL HIGH (ref 11.5–15.5)
WBC: 8.5 K/uL (ref 4.0–10.5)
nRBC: 0 % (ref 0.0–0.2)

## 2019-10-04 LAB — BASIC METABOLIC PANEL
Anion gap: 9 (ref 5–15)
BUN: 12 mg/dL (ref 8–23)
CO2: 28 mmol/L (ref 22–32)
Calcium: 9.8 mg/dL (ref 8.9–10.3)
Chloride: 105 mmol/L (ref 98–111)
Creatinine, Ser: 0.85 mg/dL (ref 0.44–1.00)
GFR calc Af Amer: >60 mL/min (ref >60)
GFR calc non Af Amer: >60 mL/min (ref >60)
Glucose, Bld: 98 mg/dL (ref 70–99)
Potassium: 4.9 mmol/L (ref 3.5–5.1)
Sodium: 142 mmol/L (ref 135–145)

## 2019-10-04 LAB — TYPE AND SCREEN
ABO/RH(D): B POS
Antibody Screen: NEGATIVE

## 2019-10-04 NOTE — Progress Notes (Signed)
COVID Vaccine Completed:Yes Date COVID Vaccine completed:04/25/19 COVID vaccine manufacturer  Moderna    PCP - Dr. Jenness [place] Cardiologist - Dr. Liane Comber  Chest x-ray - no EKG - 09/08/19 Stress Test - no ECHO - 11/24/19 Cardiac Cath - no  Sleep Study - no CPAP -   Fasting Blood Sugar - NA Checks Blood Sugar _____ times a day  Blood Thinner Instructions:ASA/ Dr. Meda Coffee Aspirin Instructions:Stop 5 days prior to DOS/[place] Last Dose:10/12/19  Anesthesia review:   Patient denies shortness of breath, fever, cough and chest pain at PAT appointment yes   Patient verbalized understanding of instructions that were given to them at the PAT appointment. Patient was also instructed that they will need to review over the PAT instructions again at home before surgery. Yes Pt has no SOB climbing stairs, housework or ADLs. She walks a few miles 3 times a week.

## 2019-10-06 ENCOUNTER — Ambulatory Visit: Payer: Medicare HMO | Admitting: Obstetrics & Gynecology

## 2019-10-13 ENCOUNTER — Telehealth: Payer: Self-pay | Admitting: Obstetrics & Gynecology

## 2019-10-13 NOTE — Telephone Encounter (Signed)
Spoke with patient. Patient has benefit questions regarding call from [facility]. Questions answered. Patient is asking about when she will get post op medications. Advised typically post op medications are prescribed the day of surgery at discharge for pick up. Patient is asking if this can be done earlier to make it easier after surgery?

## 2019-10-13 NOTE — Telephone Encounter (Signed)
Patient has questions for [facility] about upcoming surgery.

## 2019-10-14 ENCOUNTER — Other Ambulatory Visit (HOSPITAL_COMMUNITY)
Admission: RE | Admit: 2019-10-14 | Discharge: 2019-10-14 | Disposition: A | Discharge status: Home / Self Care | Payer: Medicare HMO | Source: Ambulatory Visit | Attending: Obstetrics & Gynecology | Admitting: Obstetrics & Gynecology

## 2019-10-14 DIAGNOSIS — Z01812 Encounter for preprocedural laboratory examination: Secondary | ICD-10-CM | POA: Insufficient documentation

## 2019-10-14 DIAGNOSIS — Z20822 Contact with and (suspected) exposure to covid-19: Secondary | ICD-10-CM | POA: Insufficient documentation

## 2019-10-14 LAB — SARS CORONAVIRUS 2 (TAT 6-24 HRS): SARS Coronavirus 2: NEGATIVE

## 2019-10-14 NOTE — Telephone Encounter (Signed)
Not the pain medication.  It must be done same day.  However, I can send rx to [facility] which is right beside the outpatient surgical center and I can have her son go get them while she is still in recovery.  This does make it very convenient for patients is she is ok with this.  Thanks.

## 2019-10-14 NOTE — Telephone Encounter (Signed)
Spoke with patient. Advised of message as seen below from [place]. Patient verbalizes understanding. Encounter closed.

## 2019-10-18 ENCOUNTER — Encounter (HOSPITAL_BASED_OUTPATIENT_CLINIC_OR_DEPARTMENT_OTHER): Payer: Self-pay | Admitting: Obstetrics & Gynecology

## 2019-10-18 ENCOUNTER — Ambulatory Visit (HOSPITAL_BASED_OUTPATIENT_CLINIC_OR_DEPARTMENT_OTHER): Payer: Medicare HMO | Admitting: Certified Registered"

## 2019-10-18 ENCOUNTER — Ambulatory Visit (HOSPITAL_BASED_OUTPATIENT_CLINIC_OR_DEPARTMENT_OTHER)
Admission: RE | Admit: 2019-10-18 | Discharge: 2019-10-18 | Disposition: A | Discharge status: Home / Self Care | Payer: Medicare HMO | Attending: Obstetrics & Gynecology | Admitting: Obstetrics & Gynecology

## 2019-10-18 ENCOUNTER — Encounter (HOSPITAL_BASED_OUTPATIENT_CLINIC_OR_DEPARTMENT_OTHER)
Admission: RE | Disposition: A | Discharge status: Home / Self Care | Payer: Self-pay | Source: Home / Self Care | Attending: Obstetrics & Gynecology

## 2019-10-18 ENCOUNTER — Other Ambulatory Visit: Payer: Self-pay | Admitting: Obstetrics & Gynecology

## 2019-10-18 DIAGNOSIS — I2510 Atherosclerotic heart disease of native coronary artery without angina pectoris: Secondary | ICD-10-CM | POA: Insufficient documentation

## 2019-10-18 DIAGNOSIS — F419 Anxiety disorder, unspecified: Secondary | ICD-10-CM | POA: Insufficient documentation

## 2019-10-18 DIAGNOSIS — Z880 Allergy status to penicillin: Secondary | ICD-10-CM | POA: Diagnosis not present

## 2019-10-18 DIAGNOSIS — D270 Benign neoplasm of right ovary: Secondary | ICD-10-CM | POA: Insufficient documentation

## 2019-10-18 DIAGNOSIS — E162 Hypoglycemia, unspecified: Secondary | ICD-10-CM | POA: Diagnosis not present

## 2019-10-18 DIAGNOSIS — B977 Papillomavirus as the cause of diseases classified elsewhere: Secondary | ICD-10-CM

## 2019-10-18 DIAGNOSIS — R87810 Cervical high risk human papillomavirus (HPV) DNA test positive: Secondary | ICD-10-CM | POA: Diagnosis not present

## 2019-10-18 DIAGNOSIS — R87619 Unspecified abnormal cytological findings in specimens from cervix uteri: Secondary | ICD-10-CM | POA: Diagnosis present

## 2019-10-18 DIAGNOSIS — F329 Major depressive disorder, single episode, unspecified: Secondary | ICD-10-CM | POA: Insufficient documentation

## 2019-10-18 DIAGNOSIS — E785 Hyperlipidemia, unspecified: Secondary | ICD-10-CM | POA: Insufficient documentation

## 2019-10-18 DIAGNOSIS — C44711 Basal cell carcinoma of skin of unspecified lower limb, including hip: Secondary | ICD-10-CM | POA: Diagnosis not present

## 2019-10-18 DIAGNOSIS — N879 Dysplasia of cervix uteri, unspecified: Secondary | ICD-10-CM | POA: Diagnosis not present

## 2019-10-18 DIAGNOSIS — Z8249 Family history of ischemic heart disease and other diseases of the circulatory system: Secondary | ICD-10-CM | POA: Diagnosis not present

## 2019-10-18 DIAGNOSIS — D251 Intramural leiomyoma of uterus: Secondary | ICD-10-CM | POA: Diagnosis not present

## 2019-10-18 DIAGNOSIS — R69 Illness, unspecified: Secondary | ICD-10-CM | POA: Diagnosis not present

## 2019-10-18 DIAGNOSIS — Z7982 Long term (current) use of aspirin: Secondary | ICD-10-CM | POA: Diagnosis not present

## 2019-10-18 DIAGNOSIS — D259 Leiomyoma of uterus, unspecified: Secondary | ICD-10-CM | POA: Insufficient documentation

## 2019-10-18 DIAGNOSIS — Z79899 Other long term (current) drug therapy: Secondary | ICD-10-CM | POA: Diagnosis not present

## 2019-10-18 DIAGNOSIS — Z882 Allergy status to sulfonamides status: Secondary | ICD-10-CM | POA: Diagnosis not present

## 2019-10-18 DIAGNOSIS — N871 Moderate cervical dysplasia: Secondary | ICD-10-CM | POA: Diagnosis not present

## 2019-10-18 DIAGNOSIS — D271 Benign neoplasm of left ovary: Secondary | ICD-10-CM | POA: Insufficient documentation

## 2019-10-18 DIAGNOSIS — D279 Benign neoplasm of unspecified ovary: Secondary | ICD-10-CM | POA: Diagnosis not present

## 2019-10-18 HISTORY — PX: TOTAL LAPAROSCOPIC HYSTERECTOMY WITH SALPINGECTOMY: SHX6742

## 2019-10-18 HISTORY — PX: CYSTOSCOPY: SHX5120

## 2019-10-18 LAB — HEMOGLOBIN: Hemoglobin: 12.2 g/dL (ref 12.0–15.0)

## 2019-10-18 LAB — ABO/RH: ABO/RH(D): B POS

## 2019-10-18 SURGERY — HYSTERECTOMY, TOTAL, LAPAROSCOPIC, WITH SALPINGECTOMY
Anesthesia: General | Site: Bladder

## 2019-10-18 MED ORDER — ENOXAPARIN SODIUM 40 MG/0.4ML ~~LOC~~ SOLN
SUBCUTANEOUS | Status: AC
  Filled 2019-10-18: qty 0.4

## 2019-10-18 MED ORDER — IBUPROFEN 800 MG PO TABS: 800.0000 mg | ORAL_TABLET | Freq: Three times a day (TID) | ORAL | 0 refills | Status: DC | PRN

## 2019-10-18 MED ORDER — ONDANSETRON HCL 4 MG/2ML IJ SOLN
INTRAMUSCULAR | Status: AC
  Filled 2019-10-18: qty 2

## 2019-10-18 MED ORDER — MIDAZOLAM HCL 5 MG/5ML IJ SOLN
INTRAMUSCULAR | Status: DC | PRN
  Administered 2019-10-18: 2 mg via INTRAVENOUS

## 2019-10-18 MED ORDER — SCOPOLAMINE 1 MG/3DAYS TD PT72
MEDICATED_PATCH | TRANSDERMAL | Status: AC
  Filled 2019-10-18: qty 1

## 2019-10-18 MED ORDER — DEXAMETHASONE SODIUM PHOSPHATE 10 MG/ML IJ SOLN
INTRAMUSCULAR | Status: DC | PRN
  Administered 2019-10-18: 10 mg via INTRAVENOUS

## 2019-10-18 MED ORDER — ACETAMINOPHEN 325 MG PO TABS: 650.0000 mg | ORAL_TABLET | ORAL | Status: DC | PRN

## 2019-10-18 MED ORDER — LACTATED RINGERS IV SOLN: INTRAVENOUS | Status: DC

## 2019-10-18 MED ORDER — ACETAMINOPHEN 160 MG/5ML PO SOLN: 1000.0000 mg | Freq: Once | ORAL | Status: DC | PRN

## 2019-10-18 MED ORDER — MENTHOL 3 MG MT LOZG: 1.0000 | LOZENGE | OROMUCOSAL | Status: DC | PRN

## 2019-10-18 MED ORDER — IBUPROFEN 800 MG PO TABS: 800.0000 mg | ORAL_TABLET | Freq: Four times a day (QID) | ORAL | Status: DC

## 2019-10-18 MED ORDER — HYDROCODONE-ACETAMINOPHEN 5-325 MG PO TABS: 1.0000 | ORAL_TABLET | Freq: Four times a day (QID) | ORAL | 0 refills | Status: DC | PRN

## 2019-10-18 MED ORDER — METRONIDAZOLE IN NACL 5-0.79 MG/ML-% IV SOLN
INTRAVENOUS | Status: AC
  Filled 2019-10-18: qty 100

## 2019-10-18 MED ORDER — SODIUM CHLORIDE 0.9 % IV SOLN
INTRAVENOUS | Status: DC | PRN
  Administered 2019-10-18: 60 mL

## 2019-10-18 MED ORDER — ENOXAPARIN SODIUM 40 MG/0.4ML ~~LOC~~ SOLN: 40.0000 mg | SUBCUTANEOUS | Status: DC

## 2019-10-18 MED ORDER — OXYCODONE HCL 5 MG/5ML PO SOLN: 5.0000 mg | Freq: Once | ORAL | Status: DC | PRN

## 2019-10-18 MED ORDER — KETOROLAC TROMETHAMINE 30 MG/ML IJ SOLN
INTRAMUSCULAR | Status: AC
  Filled 2019-10-18: qty 1

## 2019-10-18 MED ORDER — FENTANYL CITRATE (PF) 100 MCG/2ML IJ SOLN
INTRAMUSCULAR | Status: DC | PRN
Start: 2019-10-18 — End: 2019-10-18
  Administered 2019-10-18 (×2): 50 ug via INTRAVENOUS
  Administered 2019-10-18: 100 ug via INTRAVENOUS

## 2019-10-18 MED ORDER — ROCURONIUM BROMIDE 10 MG/ML (PF) SYRINGE
PREFILLED_SYRINGE | INTRAVENOUS | Status: AC
  Filled 2019-10-18: qty 10

## 2019-10-18 MED ORDER — ONDANSETRON HCL 4 MG/2ML IJ SOLN
INTRAMUSCULAR | Status: DC | PRN
  Administered 2019-10-18: 4 mg via INTRAVENOUS

## 2019-10-18 MED ORDER — DEXTROSE-NACL 5-0.45 % IV SOLN: INTRAVENOUS | Status: DC

## 2019-10-18 MED ORDER — OXYCODONE HCL 5 MG PO TABS: 5.0000 mg | ORAL_TABLET | Freq: Once | ORAL | Status: DC | PRN

## 2019-10-18 MED ORDER — PHENYLEPHRINE 40 MCG/ML (10ML) SYRINGE FOR IV PUSH (FOR BLOOD PRESSURE SUPPORT)
PREFILLED_SYRINGE | INTRAVENOUS | Status: AC
  Filled 2019-10-18: qty 10

## 2019-10-18 MED ORDER — POVIDONE-IODINE 10 % EX SWAB: 2.0000 "application " | Freq: Once | CUTANEOUS | Status: DC

## 2019-10-18 MED ORDER — LIDOCAINE 2% (20 MG/ML) 5 ML SYRINGE
INTRAMUSCULAR | Status: DC | PRN
  Administered 2019-10-18: 80 mg via INTRAVENOUS

## 2019-10-18 MED ORDER — BUPIVACAINE HCL (PF) 0.25 % IJ SOLN
INTRAMUSCULAR | Status: DC | PRN
  Administered 2019-10-18: 7 mL

## 2019-10-18 MED ORDER — METRONIDAZOLE IN NACL 5-0.79 MG/ML-% IV SOLN: 500.0000 mg | INTRAVENOUS | Status: DC

## 2019-10-18 MED ORDER — ACETAMINOPHEN 500 MG PO TABS: 1000.0000 mg | ORAL_TABLET | Freq: Once | ORAL | Status: DC | PRN

## 2019-10-18 MED ORDER — PROPOFOL 10 MG/ML IV BOLUS
INTRAVENOUS | Status: AC
  Filled 2019-10-18: qty 20

## 2019-10-18 MED ORDER — FENTANYL CITRATE (PF) 100 MCG/2ML IJ SOLN: 25.0000 ug | INTRAMUSCULAR | Status: DC | PRN

## 2019-10-18 MED ORDER — FENTANYL CITRATE (PF) 250 MCG/5ML IJ SOLN
INTRAMUSCULAR | Status: AC
  Filled 2019-10-18: qty 5

## 2019-10-18 MED ORDER — CLINDAMYCIN PHOSPHATE 900 MG/50ML IV SOLN
INTRAVENOUS | Status: AC
  Filled 2019-10-18: qty 50

## 2019-10-18 MED ORDER — EPHEDRINE SULFATE-NACL 50-0.9 MG/10ML-% IV SOSY
PREFILLED_SYRINGE | INTRAVENOUS | Status: DC | PRN
  Administered 2019-10-18 (×3): 10 mg via INTRAVENOUS

## 2019-10-18 MED ORDER — SUGAMMADEX SODIUM 200 MG/2ML IV SOLN
INTRAVENOUS | Status: DC | PRN
  Administered 2019-10-18: 150 mg via INTRAVENOUS

## 2019-10-18 MED ORDER — EPHEDRINE 5 MG/ML INJ
INTRAVENOUS | Status: AC
  Filled 2019-10-18: qty 10

## 2019-10-18 MED ORDER — HYDROCODONE-ACETAMINOPHEN 5-325 MG PO TABS: 1.0000 | ORAL_TABLET | ORAL | Status: DC | PRN

## 2019-10-18 MED ORDER — ACETAMINOPHEN 10 MG/ML IV SOLN: 1000.0000 mg | Freq: Once | INTRAVENOUS | Status: DC | PRN

## 2019-10-18 MED ORDER — ENOXAPARIN SODIUM 40 MG/0.4ML ~~LOC~~ SOLN
40.0000 mg | SUBCUTANEOUS | Status: AC
  Administered 2019-10-18: 40 mg via SUBCUTANEOUS

## 2019-10-18 MED ORDER — KETOROLAC TROMETHAMINE 30 MG/ML IJ SOLN: 15.0000 mg | Freq: Once | INTRAMUSCULAR | Status: DC

## 2019-10-18 MED ORDER — DEXAMETHASONE SODIUM PHOSPHATE 10 MG/ML IJ SOLN
INTRAMUSCULAR | Status: AC
  Filled 2019-10-18: qty 1

## 2019-10-18 MED ORDER — MIDAZOLAM HCL 2 MG/2ML IJ SOLN
INTRAMUSCULAR | Status: AC
  Filled 2019-10-18: qty 2

## 2019-10-18 MED ORDER — PHENYLEPHRINE 40 MCG/ML (10ML) SYRINGE FOR IV PUSH (FOR BLOOD PRESSURE SUPPORT)
PREFILLED_SYRINGE | INTRAVENOUS | Status: DC | PRN
  Administered 2019-10-18: 80 ug via INTRAVENOUS

## 2019-10-18 MED ORDER — ALUM & MAG HYDROXIDE-SIMETH 200-200-20 MG/5ML PO SUSP: 30.0000 mL | ORAL | Status: DC | PRN

## 2019-10-18 MED ORDER — ROCURONIUM BROMIDE 10 MG/ML (PF) SYRINGE
PREFILLED_SYRINGE | INTRAVENOUS | Status: DC | PRN
  Administered 2019-10-18: 10 mg via INTRAVENOUS
  Administered 2019-10-18: 40 mg via INTRAVENOUS

## 2019-10-18 MED ORDER — SCOPOLAMINE 1 MG/3DAYS TD PT72: 1.0000 | MEDICATED_PATCH | TRANSDERMAL | Status: DC

## 2019-10-18 MED ORDER — CLINDAMYCIN PHOSPHATE 900 MG/50ML IV SOLN
900.0000 mg | Freq: Once | INTRAVENOUS | Status: AC
  Administered 2019-10-18: 900 mg via INTRAVENOUS

## 2019-10-18 MED ORDER — PROPOFOL 500 MG/50ML IV EMUL
INTRAVENOUS | Status: DC | PRN
  Administered 2019-10-18: 25 ug/kg/min via INTRAVENOUS

## 2019-10-18 MED ORDER — GENTAMICIN SULFATE 40 MG/ML IJ SOLN
5.0000 mg/kg | INTRAVENOUS | Status: AC
  Administered 2019-10-18: 310 mg via INTRAVENOUS
  Filled 2019-10-18: qty 7.75

## 2019-10-18 MED ORDER — PROPOFOL 10 MG/ML IV BOLUS
INTRAVENOUS | Status: DC | PRN
  Administered 2019-10-18: 120 mg via INTRAVENOUS

## 2019-10-18 MED ORDER — KETOROLAC TROMETHAMINE 30 MG/ML IJ SOLN
15.0000 mg | Freq: Four times a day (QID) | INTRAMUSCULAR | Status: DC
  Administered 2019-10-18: 15 mg via INTRAVENOUS

## 2019-10-18 MED ORDER — PROMETHAZINE HCL 12.5 MG PO TABS: 12.5000 mg | ORAL_TABLET | Freq: Four times a day (QID) | ORAL | 0 refills | Status: DC | PRN

## 2019-10-18 MED ORDER — SIMETHICONE 80 MG PO CHEW: 80.0000 mg | CHEWABLE_TABLET | Freq: Four times a day (QID) | ORAL | Status: DC | PRN

## 2019-10-18 MED ORDER — MORPHINE SULFATE (PF) 4 MG/ML IV SOLN: 1.0000 mg | INTRAVENOUS | Status: DC | PRN

## 2019-10-18 MED ORDER — PANTOPRAZOLE SODIUM 40 MG IV SOLR: 40.0000 mg | Freq: Every day | INTRAVENOUS | Status: DC

## 2019-10-18 MED ORDER — GENTAMICIN SULFATE 40 MG/ML IJ SOLN
5.0000 mg/kg | INTRAVENOUS | Status: DC
  Filled 2019-10-18: qty 7.75

## 2019-10-18 MED ORDER — ONDANSETRON HCL 4 MG PO TABS: 4.0000 mg | ORAL_TABLET | Freq: Four times a day (QID) | ORAL | Status: DC | PRN

## 2019-10-18 MED ORDER — ONDANSETRON HCL 4 MG/2ML IJ SOLN: 4.0000 mg | Freq: Four times a day (QID) | INTRAMUSCULAR | Status: DC | PRN

## 2019-10-18 MED FILL — IBUPROFEN 800 MG TAB: 800 | 6 days supply | Qty: 20 | Fill #0

## 2019-10-18 MED FILL — HYDROCODON-APAP 5-325: 5-325 | 4 days supply | Qty: 20 | Fill #0

## 2019-10-18 MED FILL — PROMETHAZINE 12.5 MG TABLET: 12.5 | 5 days supply | Qty: 20 | Fill #0

## 2019-10-18 SURGICAL SUPPLY — 68 items
ADH SKN CLS APL DERMABOND .7 (GAUZE/BANDAGES/DRESSINGS) ×2
APL SRG 38 LTWT LNG FL B (MISCELLANEOUS) ×2
APPLICATOR ARISTA FLEXITIP XL (MISCELLANEOUS) ×1 IMPLANT
BLADE SURG 10 STRL SS (BLADE) IMPLANT
CABLE HIGH FREQUENCY MONO STRZ (ELECTRODE) IMPLANT
CELL SAVER LIPIGURD (MISCELLANEOUS) IMPLANT
COVER BACK TABLE 60X90IN (DRAPES) ×3 IMPLANT
COVER MAYO STAND STRL (DRAPES) ×3 IMPLANT
COVER SURGICAL LIGHT HANDLE (MISCELLANEOUS) ×1 IMPLANT
COVER WAND RF STERILE (DRAPES) ×3 IMPLANT
DERMABOND ADVANCED (GAUZE/BANDAGES/DRESSINGS) ×1
DERMABOND ADVANCED .7 DNX12 (GAUZE/BANDAGES/DRESSINGS) ×2 IMPLANT
DEVICE RETRIEVAL ALEXIS 14 (MISCELLANEOUS) IMPLANT
DILATOR CANAL MILEX (MISCELLANEOUS) ×1 IMPLANT
DRSG COVADERM PLUS 2X2 (GAUZE/BANDAGES/DRESSINGS) IMPLANT
DURAPREP 26ML APPLICATOR (WOUND CARE) ×3 IMPLANT
EXTRT SYSTEM ALEXIS 14CM (MISCELLANEOUS)
EXTRT SYSTEM ALEXIS 17CM (MISCELLANEOUS)
GAUZE 4X4 16PLY RFD (DISPOSABLE) ×3 IMPLANT
GLOVE BIO SURGEON STRL SZ 6.5 (GLOVE) ×5 IMPLANT
GLOVE BIOGEL PI IND STRL 6.5 (GLOVE) ×2 IMPLANT
GLOVE BIOGEL PI IND STRL 7.0 (GLOVE) ×4 IMPLANT
GLOVE BIOGEL PI INDICATOR 6.5 (GLOVE) ×1
GLOVE BIOGEL PI INDICATOR 7.0 (GLOVE) ×2
GLOVE ECLIPSE 6.5 STRL STRAW (GLOVE) ×6 IMPLANT
GOWN STRL REUS W/TWL XL LVL3 (GOWN DISPOSABLE) ×6 IMPLANT
HARMONIC RUM II 2.5CM SILVER (DISPOSABLE) ×3
HEMOSTAT ARISTA ABSORB 3G PWDR (HEMOSTASIS) ×1 IMPLANT
LEGGING LITHOTOMY PAIR STRL (DRAPES) ×4 IMPLANT
LIGASURE VESSEL 5MM BLUNT TIP (ELECTROSURGICAL) ×3 IMPLANT
NEEDLE INSUFFLATION 120MM (ENDOMECHANICALS) ×3 IMPLANT
NS IRRIG 1000ML POUR BTL (IV SOLUTION) ×3 IMPLANT
OCCLUDER COLPOPNEUMO (BALLOONS) ×3 IMPLANT
PACK LAPAROSCOPY BASIN (CUSTOM PROCEDURE TRAY) ×3 IMPLANT
PACK TRENDGUARD 450 HYBRID PRO (MISCELLANEOUS) ×2 IMPLANT
PENCIL SMOKE EVACUATOR (MISCELLANEOUS) IMPLANT
POUCH LAPAROSCOPIC INSTRUMENT (MISCELLANEOUS) ×3 IMPLANT
PROTECTOR NERVE ULNAR (MISCELLANEOUS) ×4 IMPLANT
SCALPEL HRMNC RUM II 2.5 SILVR (DISPOSABLE) IMPLANT
SCISSORS LAP 5X35 DISP (ENDOMECHANICALS) IMPLANT
SET IRRIG Y TYPE TUR BLADDER L (SET/KITS/TRAYS/PACK) ×3 IMPLANT
SET SUCTION IRRIG HYDROSURG (IRRIGATION / IRRIGATOR) ×3 IMPLANT
SET TRI-LUMEN FLTR TB AIRSEAL (TUBING) ×3 IMPLANT
SHEARS HARMONIC ACE PLUS 36CM (ENDOMECHANICALS) ×3 IMPLANT
SUT VIC AB 0 CT1 27 (SUTURE) ×6
SUT VIC AB 0 CT1 27XBRD ANBCTR (SUTURE) ×4 IMPLANT
SUT VICRYL 0 UR6 27IN ABS (SUTURE) IMPLANT
SUT VICRYL 4-0 PS2 18IN ABS (SUTURE) ×3 IMPLANT
SUT VLOC 180 0 9IN  GS21 (SUTURE) ×3
SUT VLOC 180 0 9IN GS21 (SUTURE) ×2 IMPLANT
SYR 10ML LL (SYRINGE) ×3 IMPLANT
SYR 50ML LL SCALE MARK (SYRINGE) ×6 IMPLANT
SYSTEM CARTER THOMASON II (TROCAR) IMPLANT
SYSTEM CONTND EXTRCTN KII BLLN (MISCELLANEOUS) IMPLANT
TIP UTERINE 5.1X6CM LAV DISP (MISCELLANEOUS) ×1 IMPLANT
TIP UTERINE 6.7X10CM GRN DISP (MISCELLANEOUS) IMPLANT
TIP UTERINE 6.7X6CM WHT DISP (MISCELLANEOUS) IMPLANT
TIP UTERINE 6.7X8CM BLUE DISP (MISCELLANEOUS) IMPLANT
TOWEL OR 17X26 10 PK STRL BLUE (TOWEL DISPOSABLE) ×5 IMPLANT
TRAP SPECIMEN MUCUS 40CC (MISCELLANEOUS) ×1 IMPLANT
TRAY FOLEY W/BAG SLVR 14FR (SET/KITS/TRAYS/PACK) ×3 IMPLANT
TRENDGUARD 450 HYBRID PRO PACK (MISCELLANEOUS) ×3
TROCAR ADV FIXATION 5X100MM (TROCAR) ×3 IMPLANT
TROCAR BLADELESS OPT 5 100 (ENDOMECHANICALS) ×3 IMPLANT
TROCAR PORT AIRSEAL 5X120 (TROCAR) ×3 IMPLANT
TROCAR XCEL NON BLADE 8MM B8LT (ENDOMECHANICALS) ×3 IMPLANT
WARMER LAPAROSCOPE (MISCELLANEOUS) ×3 IMPLANT
WATER STERILE IRR 3000ML UROMA (IV SOLUTION) ×2 IMPLANT

## 2019-10-18 NOTE — H&P (Signed)
Alicia Wilkerson is an 69 y.o. female  G20P3 WWF with hx of persistent cervical dysplasia with TLH, BSO, cystoscopy.  She has decided that she wants her ovaries removed.  She wanted to discuss with Dr. Meda Coffee prior to making this decision.  Pt has hx of abnormal pap smears and +HR HPV that has been present about 8 years.  She's undergone two LEEP procedure in 2017 with CIN 2, negative margins and then again in 10/20 which showed inflammation but prior ECC showed CIN2.  Most recent pap smear has shown epithelial atypia with +HR HPV.  Dur to size of cervix, I do not feel another LEEP or conization is possible.  As well, she understands that I am concerned there is endocervical disease that is harder to fully identify and therefore, hysterectomy for definitive treatment has been recommended.  Risks, benefits and alternatives have been discussed.  She has undergone cardiac clearance with Dr. Meda Coffee.  She is here and ready to proceed.    Pertinent Gynecological History: Menses: post-menopausal Bleeding: none Contraception: post menopausal status DES exposure: denies Blood transfusions: yes, h/o splenectomy Sexually transmitted diseases: no past history Previous GYN Procedures: LEEP x 2  Last mammogram: normal Date: 06/2019 Last pap: abnormal: with epithelial atypia, +HR HPV Date: 06/2019 OB History: G3, P3   Menstrual History: Patient's last menstrual period was 02/24/2002.    Past Medical History:  Diagnosis Date  . Anemia   . Anxiety disorder   . Basal cell carcinoma (BCC) of left upper arm 12/2015  . Basal cell carcinoma (BCC) of right lower leg 04/2016  . Borderline hypertension   . CAD (coronary artery disease)    a. nonobst by cor CT 12/2018.  Marland Kitchen CIN II (cervical intraepithelial neoplasia II)    CIN  1  . Depression   . Eczema   . Family history of premature CAD   . History of basal cell carcinoma (BCC) excision    left upper arm 2017;   right lower leg 2018;  bilateral lower leg and hip  area 2019;   inner right lower leg, outer right thigh, & upper outside left arm 09/ 2020  . History of cervical dysplasia 2013  . History of hereditary spherocytosis    s/p splenectomy in 1954  . History of hyperparathyroidism    per pt yrs ago was put on mega dose of vit d which caused the hyperparathyroid resolved after stopping vit d  . History of pelvic fracture 2013  . MAI (mycobacterium avium-intracellulare) ([place])    ? possibly by CT 12/2018  . Mild hyperlipidemia   . Osteoporosis   . PONV (postoperative nausea and vomiting)   . S/P splenectomy    spherocytosis  . Wears glasses   . White coat syndrome without diagnosis of hypertension     Past Surgical History:  Procedure Laterality Date  . ANKLE SURGERY  03/14/2015   . CERVICAL CONIZATION W/BX N/A 12/13/2018   Procedure: CONIZATION CERVIX WITH BIOPSY;  Surgeon: Megan Salon, MD;  Location: [facility];  Service: Gynecology;  Laterality: N/A;  . IR RADIOLOGY PERIPHERAL GUIDED IV START  01/05/2019  . IR US GUIDE VASC ACCESS RIGHT  01/05/2019  . KNEE SURGERY Left 1991   per pt retained hardward  . LEEP N/A 12/13/2018   Procedure: possible LOOP ELECTROSURGICAL EXCISION PROCEDURE (LEEP);  Surgeon: Megan Salon, MD;  Location: [facility];  Service: Gynecology;  Laterality: N/A;  . ORIF ANKLE FRACTURE Left 03/14/2015   per pt  retained hardware  . SPLENECTOMY, TOTAL  1954   due to spherocytosis  . TUBAL LIGATION Bilateral 1995    Family History  Problem Relation Age of Onset  . Heart failure Father   . Skin cancer Father   . Hypertension Father   . Heart disease Father   . Osteoporosis Mother   . Colon cancer Mother        deceased  . Heart disease Sister   . Crohn's disease Daughter     Social History:  reports that she has never smoked. She has never used smokeless tobacco. She reports previous alcohol use. She reports that she does not use drugs.  Allergies:  Allergies  Allergen Reactions  . Penicillins Rash    Did it  involve swelling of the face/tongue/throat, SOB, or low BP? No Did it involve sudden or severe rash/hives, skin peeling, or any reaction on the inside of your mouth or nose?  #  #  #  YES  #  #  #  Did you need to seek medical attention at a hospital or doctor's office? #  #  #  YES  #  #  #  When did it last happen?years ago If all above answers are "NO", may proceed with cephalosporin use.   . Sulfa Antibiotics Hives    Medications Prior to Admission  Medication Sig Dispense Refill Last Dose  . aspirin EC 81 MG tablet Take 1 tablet (81 mg total) by mouth daily. 90 tablet 3 10/12/2019  . clonazePAM (KLONOPIN) 0.5 MG tablet Take 1 tablet (0.5 mg total) by mouth daily as needed for anxiety. 30 tablet 2 10/18/2019 at 0500  . escitalopram (LEXAPRO) 5 MG tablet Take 5 mg by mouth daily.    10/17/2019 at Unknown time  . halobetasol (ULTRAVATE) 0.05 % cream Apply 1 application topically daily as needed (irritation).    Past Month at Unknown time  . losartan (COZAAR) 25 MG tablet Take 1 tablet (25 mg total) by mouth daily. 90 tablet 2 10/17/2019 at Unknown time  . Omega 3-6-9 Fatty Acids (OMEGA-3-6-9 PO) Take 2 capsules by mouth daily. 694 mg   10/12/2019  . rosuvastatin (CRESTOR) 20 MG tablet TAKE 1 TABLET BY MOUTH EVERY DAY (Patient taking differently: Take 20 mg by mouth daily. ) 90 tablet 1 10/17/2019 at Unknown time    Review of Systems  All other systems reviewed and are negative.   Blood pressure 139/65, pulse 65, temperature 97.7 F (36.5 C), temperature source Oral, resp. rate 14, height 5\' 3"  (1.6 m), weight 61.9 kg, last menstrual period 02/24/2002, SpO2 98 %. Physical Exam Vitals reviewed.  Constitutional:      Appearance: Normal appearance.  HENT:     Head: Normocephalic and atraumatic.  Cardiovascular:     Rate and Rhythm: Normal rate and regular rhythm.     Pulses: Normal pulses.     Heart sounds: Normal heart sounds.  Pulmonary:     Effort: Pulmonary effort is normal.      Breath sounds: Normal breath sounds.  Abdominal:     General: Abdomen is flat. Bowel sounds are normal.     Palpations: Abdomen is soft.  Neurological:     General: No focal deficit present.     Mental Status: She is alert and oriented to person, place, and time.  Psychiatric:        Mood and Affect: Mood normal.        Behavior: Behavior normal.  No results found for this or any previous visit (from the past 24 hour(s)).  No results found.  Assessment/Plan: 69 yo [place] with h/o persistent abnormal pap smears, +HR HPV, current abnormal pap smear, h/o CIN 2 prior to most recent LEEP in 11/2018, here for TLH/BSO, cystoscopy for definitive treatment.  Megan Salon 10/18/2019, 7:05 AM

## 2019-10-18 NOTE — Anesthesia Preprocedure Evaluation (Addendum)
Anesthesia Evaluation  Patient identified by MRN, date of birth, ID band Patient awake    Reviewed: Allergy & Precautions, NPO status , Patient's Chart, lab work & pertinent test results  History of Anesthesia Complications (+) PONV and history of anesthetic complications  Airway Mallampati: III  TM Distance: <3 FB Neck ROM: Full    Dental  (+) Dental Advisory Given, Teeth Intact   Pulmonary neg pulmonary ROS, neg recent URI,    breath sounds clear to auscultation       Cardiovascular hypertension, Pt. on medications (-) angina+ CAD  (-) Past MI, (-) Cardiac Stents and (-) CABG  Rhythm:Regular  1. Left ventricular ejection fraction, by visual estimation, is 60 to  65%. The left ventricle has normal function. Normal left ventricular size.  There is no left ventricular hypertrophy.  2. Elevated left ventricular end-diastolic pressure.  3. Left ventricular diastolic Doppler parameters are consistent with  impaired relaxation pattern of LV diastolic filling.  4. Global right ventricle has normal systolic function.The right  ventricular size is normal. No increase in right ventricular wall  thickness.  5. Left atrial size was normal.  6. Right atrial size was normal.  7. The mitral valve is normal in structure. No evidence of mitral valve  regurgitation. No evidence of mitral stenosis.  8. The tricuspid valve is normal in structure. Tricuspid valve  regurgitation is mild.  9. The aortic valve was not well visualized Aortic valve regurgitation  was not visualized by color flow Doppler. Structurally normal aortic  valve, with no evidence of sclerosis or stenosis.  10. The pulmonic valve was normal in structure. Pulmonic valve  regurgitation is not visualized by color flow Doppler.  11. Normal pulmonary artery systolic pressure.  12. The inferior vena cava is normal in size with greater than 50%  respiratory variability,  suggesting right atrial pressure of 3 mmHg.   CAD (coronary artery disease)  a. nonobst by cor CT 12/2018.    Neuro/Psych PSYCHIATRIC DISORDERS Anxiety Depression negative neurological ROS     GI/Hepatic negative GI ROS, Neg liver ROS,   Endo/Other  negative endocrine ROS  Renal/GU negative Renal ROS     Musculoskeletal negative musculoskeletal ROS (+)   Abdominal   Peds  Hematology Hgb 12.8 S/P splenectomy   Anesthesia Other Findings   Reproductive/Obstetrics                            Anesthesia Physical Anesthesia Plan  ASA: II  Anesthesia Plan: General   Post-op Pain Management:    Induction: Intravenous  PONV Risk Score and Plan: 4 or greater and Ondansetron, Dexamethasone, Propofol infusion, Midazolam and Scopolamine patch - Pre-op  Airway Management Planned: Oral ETT  Additional Equipment: None  Intra-op Plan:   Post-operative Plan: Extubation in OR  Informed Consent: I have reviewed the patients History and Physical, chart, labs and discussed the procedure including the risks, benefits and alternatives for the proposed anesthesia with the patient or authorized representative who has indicated his/her understanding and acceptance.     Dental advisory given  Plan Discussed with: CRNA and Surgeon  Anesthesia Plan Comments:         Anesthesia Quick Evaluation

## 2019-10-18 NOTE — Anesthesia Postprocedure Evaluation (Signed)
Anesthesia Post Note  Patient: Alicia Wilkerson  Procedure(s) Performed: TOTAL LAPAROSCOPIC HYSTERECTOMY WITH BILATERAL SALPINGECTOMY AND BILATERAL OOPHORECTOMY (N/A Abdomen) CYSTOSCOPY (N/A Bladder)     Patient location during evaluation: PACU Anesthesia Type: General Level of consciousness: awake and alert Pain management: pain level controlled Vital Signs Assessment: post-procedure vital signs reviewed and stable Respiratory status: spontaneous breathing, nonlabored ventilation, respiratory function stable and patient connected to nasal cannula oxygen Cardiovascular status: blood pressure returned to baseline and stable Postop Assessment: no apparent nausea or vomiting Anesthetic complications: no   No complications documented.  Last Vitals:  Vitals:   10/18/19 1116 10/18/19 1154  BP: (!) 143/69 134/64  Pulse: 72 67  Resp: 15 16  Temp: (!) 36.3 C 36.4 C  SpO2: 93% 97%    Last Pain:  Vitals:   10/18/19 1116  TempSrc:   PainSc: 0-No pain                 Jaydn Fincher

## 2019-10-18 NOTE — Anesthesia Procedure Notes (Signed)
Procedure Name: Intubation Date/Time: 10/18/2019 7:45 AM Performed by: Donna Snooks D, CRNA Pre-anesthesia Checklist: Patient identified, Emergency Drugs available, Suction available and Patient being monitored Patient Re-evaluated:Patient Re-evaluated prior to induction Oxygen Delivery Method: Circle system utilized Preoxygenation: Pre-oxygenation with 100% oxygen Induction Type: IV induction Ventilation: Mask ventilation without difficulty Laryngoscope Size: Mac and 3 Tube type: Oral Tube size: 7.0 mm Number of attempts: 1 Airway Equipment and Method: Stylet Placement Confirmation: ETT inserted through vocal cords under direct vision,  positive ETCO2 and breath sounds checked- equal and bilateral Secured at: 21 cm Tube secured with: Tape Dental Injury: Teeth and Oropharynx as per pre-operative assessment

## 2019-10-18 NOTE — Transfer of Care (Signed)
Immediate Anesthesia Transfer of Care Note  Patient: ZAKIRAH WEINGART  Procedure(s) Performed: TOTAL LAPAROSCOPIC HYSTERECTOMY WITH BILATERAL SALPINGECTOMY AND BILATERAL OOPHORECTOMY (N/A Abdomen) CYSTOSCOPY (N/A Bladder)  Patient Location: PACU  Anesthesia Type:General  Level of Consciousness: awake, alert  and oriented  Airway & Oxygen Therapy: Patient Spontanous Breathing and Patient connected to nasal cannula oxygen  Post-op Assessment: Report given to RN and Post -op Vital signs reviewed and stable  Post vital signs: Reviewed and stable  Last Vitals:  Vitals Value Taken Time  BP 148/66 10/18/19 1000  Temp    Pulse 71 10/18/19 1005  Resp 11 10/18/19 1005  SpO2 99 % 10/18/19 1005  Vitals shown include unvalidated device data.  Last Pain:  Vitals:   10/18/19 0702  TempSrc: Oral  PainSc: 0-No pain      Patients Stated Pain Goal: 5 (28/20/60 1561)  Complications: No complications documented.

## 2019-10-18 NOTE — Op Note (Signed)
10/18/2019  9:44 AM  PATIENT:  Alicia Wilkerson  69 y.o. female  PRE-OPERATIVE DIAGNOSIS:  Dysplasia of cervix, high grade CIN 2, High risk HPV infection  POST-OPERATIVE DIAGNOSIS:  Dysplasia of cervix, high grade CIN 2, High risk HPV infection  PROCEDURE:  Procedure(s): TOTAL LAPAROSCOPIC HYSTERECTOMY WITH BILATERAL SALPINGECTOMY AND BILATERAL OOPHORECTOMY CYSTOSCOPY  SURGEON:  Megan Salon  ASSISTANTS: Sumner Boast, MD   ANESTHESIA:   general  ESTIMATED BLOOD LOSS: 50 mL  BLOOD ADMINISTERED:none   FLUIDS: 1400cc  UOP: 150cc   SPECIMEN:  Uterus, cervix, bilateral fallopian tubes, ovaries, pelvic washings  DISPOSITION OF SPECIMEN:  PATHOLOGY  FINDINGS: small uterus, ovarian cyst on right, simple in appearance, cervix flush with vagina  DESCRIPTION OF OPERATION: Patient is taken to the operating room. She is placed in the supine position. She is a running IV in place. Informed consent was present on the chart. SCDs on her lower extremities and functioning properly. Patient was positioned while she was awake.  Her legs were placed in the low lithotomy position in [place]. Her arms were tucked by the side.  General endotracheal anesthesia was administered by the anesthesia staff without difficulty. Dr. Ermalene Postin, anesthesia, oversaw case.  Time out performed.    Chlora prep was then used to prep the abdomen and Hibiclens was used to prep the inner thighs, perineum and vagina. Once 3 minutes had past the patient was draped in a normal standard fashion. The legs were lifted to the high lithotomy position. The cervix was visualized by placing a heavy weighted speculum in the posterior aspect of the vagina and using a curved Deaver retractor to the retract anteriorly. The anterior lip of the cervix was grasped with single-tooth tenaculum.  The cervix sounded to 6 cm. Pratt dilators were used to dilate the cervix up to a #17. A RUMI uterine manipulator was obtained. A # 6 disposable tip  was placed on the RUMI manipulator as well as a 2.5, silver KOH ring. This was passed through the cervix and the bulb of the disposable tip was inflated with 3 cc of normal saline. There was a good fit of the KOH ring around the cervix. The tenaculum was removed. There is also good manipulation of the uterus. The speculum and retractor were removed as well. A Foley catheter was placed to straight drain.  Clear urine was noted. Legs were lowered to the low lithotomy position and attention was turned the abdomen.  The umbilicus was everted.  A Veress needle was obtained. Syringe of sterile saline was placed on a open Veress needle.  This was passed into the umbilicus until just when the fluid started to drip.  Then low flow CO2 gas was attached the needle and the pneumoperitoneum was achieved without difficulty. Once four liters of gas was in the abdomen the Veress needle was removed and a 5 millimeter non-bladed Optiview trocar and port were passed directly to the abdomen. The laparoscope was then used to confirm intraperitoneal placement. Findings noted above.  Locations for RLQ, LLQ, and suprapubic ports were noted by transillumination of the abdominal wall.  0.25% marcaine was used to anesthetize the skin.  59mm skin incision was made in the RLQ and an AirSeal port was placed underdirect visualization of the laparoscope.  Then a 36mm skin incision was made and a 2mm nonbladed trochar and port was placed in the LLQ.  Finally, and 73mm skin incision was made about 4cm above the pubic symphasis and an 50mm non-bladed  port was placed with direct visualization of the laparoscope.  All trochars were removed.    Washings were obtained.  Ureters were identifies.  Attention was turned to the left side. With uterus on stretch the left IP ligament was clamped, cauterized and incised.  Left round ligament was serially clamped cauterized and incised. The anterior and posterior peritoneum of the inferior leaf of the broad  ligament were opened. The beginning of the bladder flap was created.  The bladder was taken down below the level of the KOH ring. The left uterine artery skeletonized and then just superior to the KOH ring this vessel was serially clamped, cauterized, and incised.  Attention was turned the right side.  The uterus was placed on stretch to the opposite side.  With uterus on stretch the right IP ligament was clamped, cauterized and incised.  Right round ligament was serially clamped cauterized and incised.  Next the right round ligament was serially clamped cauterized and incised. The anterior posterior peritoneum of the inferiorly for the broad ligament were opened. The anterior peritoneum was carried across to the dissection on the left side. The remainder of the bladder flap was created using sharp dissection. The bladder was well below the level of the KOH ring. The left uterine artery skeletonized. Then the left uterine artery, above the level of the KOH ring, was serially clamped cauterized and incised. The uterus was devascularized at this point.  The colpotomy was performed a starting in the midline and using a harmonic scalpel with the inferior edge of the open blade  This was carried around a circumferential fashion until the vaginal mucosa was completely incised in the specimen was freed.  The specimen was then delivered to the vagina.  A vaginal occlusive device was used to maintain the pneumoperitoneum  Instruments were changed with a needle driver and Kobra graspers.  Using a 9 inch V. lock suture, the cuff was closed by incorporating the anterior and posterior vaginal mucosa in each stitch. This was carried across all the way to the left corner and a running fashion. Two stitches were brought back towards the midline and the suture was cut flush with the vagina. The needle was brought out the pelvis. The pelvis was irrigated. All pedicles were inspected. There was a single area of bleeding high on  the bladder where dissection was performed.  This was made hemostatic with the Ligasure device by just superficially touching the area of bleeding.  Excellent hemostasis was present at this time.  CO2 pressures were lowered to 65mmHg.  Again, no bleeding was noted.  Arista was placed along the cuff and pedicles.  The remaining instruments were removed.  The ports were removed under direct visualization of the laparoscope and the pneumoperitoneum was relieved.  The patient was taken out of Trendelenburg positioning.  Several deep breaths were given to the patient's trying to any gas the abdomen and finally the suprpubic port was removed.  The skin was then closed with subcuticular stitches of 3-0 Vicryl. The skin was cleansed Dermabond was applied. Attention was then turned the vagina and the cuff was inspected. No bleeding was noted. The anterior and posterior vaginal mucosa was incorporated in each stitch. The Foley catheter was removed.  Cystoscopy was performed.  No sutures or bladder injuries were noted.  Ureters were noted with normal urine jets from each one was seen.  Foley was left out after the cystoscopic fluid was drained and cystoscope removed.  Sponge, lap, needle, initially counts  were correct x2. Patient tolerated the procedure very well. She was awakened from anesthesia, extubated and taken to recovery in stable condition.   COUNTS:  YES  PLAN OF CARE: Transfer to PACU

## 2019-10-18 NOTE — Discharge Instructions (Signed)

## 2019-10-19 ENCOUNTER — Encounter (HOSPITAL_BASED_OUTPATIENT_CLINIC_OR_DEPARTMENT_OTHER): Payer: Self-pay | Admitting: Obstetrics & Gynecology

## 2019-10-19 ENCOUNTER — Telehealth: Payer: Medicare HMO

## 2019-10-20 ENCOUNTER — Telehealth: Payer: Self-pay

## 2019-10-20 LAB — CYTOLOGY - NON PAP

## 2019-10-20 NOTE — Telephone Encounter (Signed)
Patient is calling to go over results from surgery.

## 2019-10-20 NOTE — Telephone Encounter (Signed)
Called pt personally as well and left detailed message after reviewing DPR.  Ok to close encounter.

## 2019-10-20 NOTE — Telephone Encounter (Signed)
Call placed to pt to let pt know Dr Sabra Heck out of office today. Pt requesting another provider look at results. Advised will review with Dr Talbert Nan and return call. Pt agreeable. Thankful for update.   Routing to Dr Diamantina Providence provider for Dr Sabra Heck.

## 2019-10-20 NOTE — Telephone Encounter (Signed)
Spoke with pt. Pt states seeing Mychart results. Pt states concern with results on non- pap cytology.  States "blood transfusion" is charted in Epic and pt did not have. Please advise   Pt also states doing well after surgery and had BM today and feeling good. Not taking any pain meds at this time.   Advised will review with Dr Sabra Heck and return call. Pt agreeable.

## 2019-10-21 LAB — SURGICAL PATHOLOGY

## 2019-10-24 ENCOUNTER — Encounter: Payer: Medicare HMO | Admitting: Internal Medicine

## 2019-10-24 NOTE — Progress Notes (Signed)
Post Operative Visit  Procedure: Total laparoscopic hysterectomy with bilateral salpingectomy/oophorectomy Days Post-op: 9 days  Subjective: Doing really well.  Feels great.  Denies vaginal bleeding.  Denies vaginal discharge.  Has some bruising around the RLQ incision.  Pathology reviewed.  Questions answered.  Objective: Wt 135 lb (61.2 kg)   LMP 02/24/2002   BMI 23.91 kg/m   EXAM General: alert and no distress GI: soft, non-tender; bowel sounds normal; no masses,  no organomegaly and incision: clean, dry, intact and bruising around RLQ incisoin Extremities: extremities normal, atraumatic, no cyanosis or edema Vaginal Bleeding: none  Assessment: s/p TLH/BSO/cystoscopy 1 week ago due to persistnet +HR HPV, h/o CIN 2 and 3 as well as small cervix and concerns about inability to assess adequately in cervix  Plan: Recheck 3 weeks

## 2019-10-27 ENCOUNTER — Other Ambulatory Visit: Payer: Self-pay

## 2019-10-27 ENCOUNTER — Ambulatory Visit (INDEPENDENT_AMBULATORY_CARE_PROVIDER_SITE_OTHER): Payer: Medicare HMO | Admitting: Obstetrics & Gynecology

## 2019-10-27 ENCOUNTER — Encounter: Payer: Self-pay | Admitting: Obstetrics & Gynecology

## 2019-10-27 VITALS — BP 108/68 | HR 68 | Resp 16 | Wt 135.0 lb

## 2019-10-27 DIAGNOSIS — Z9889 Other specified postprocedural states: Secondary | ICD-10-CM

## 2019-11-03 DIAGNOSIS — E785 Hyperlipidemia, unspecified: Secondary | ICD-10-CM | POA: Insufficient documentation

## 2019-11-03 DIAGNOSIS — Z9079 Acquired absence of other genital organ(s): Secondary | ICD-10-CM | POA: Insufficient documentation

## 2019-11-03 DIAGNOSIS — I2510 Atherosclerotic heart disease of native coronary artery without angina pectoris: Secondary | ICD-10-CM | POA: Insufficient documentation

## 2019-11-03 DIAGNOSIS — Z90710 Acquired absence of both cervix and uterus: Secondary | ICD-10-CM | POA: Insufficient documentation

## 2019-11-03 NOTE — Patient Instructions (Addendum)
Blood work was ordered.    All other Health Maintenance issues reviewed.   All recommended immunizations and age-appropriate screenings are up-to-date or discussed.  Pneumovax immunization administered today.   Medications reviewed and updated.  Changes include :   none  Your prescription(s) have been submitted to your pharmacy. Please take as directed and contact our office if you believe you are having problem(s) with the medication(s).   Please followup in 1 year    Health Maintenance, Female Adopting a healthy lifestyle and getting preventive care are important in promoting health and wellness. Ask your health care provider about:  The right schedule for you to have regular tests and exams.  Things you can do on your own to prevent diseases and keep yourself healthy. What should I know about diet, weight, and exercise? Eat a healthy diet   Eat a diet that includes plenty of vegetables, fruits, low-fat dairy products, and lean protein.  Do not eat a lot of foods that are high in solid fats, added sugars, or sodium. Maintain a healthy weight Body mass index (BMI) is used to identify weight problems. It estimates body fat based on height and weight. Your health care provider can help determine your BMI and help you achieve or maintain a healthy weight. Get regular exercise Get regular exercise. This is one of the most important things you can do for your health. Most adults should:  Exercise for at least 150 minutes each week. The exercise should increase your heart rate and make you sweat (moderate-intensity exercise).  Do strengthening exercises at least twice a week. This is in addition to the moderate-intensity exercise.  Spend less time sitting. Even light physical activity can be beneficial. Watch cholesterol and blood lipids Have your blood tested for lipids and cholesterol at 67 years of age, then have this test every 5 years. Have your cholesterol levels checked  more often if:  Your lipid or cholesterol levels are high.  You are older than 67 years of age.  You are at high risk for heart disease. What should I know about cancer screening? Depending on your health history and family history, you may need to have cancer screening at various ages. This may include screening for:  Breast cancer.  Cervical cancer.  Colorectal cancer.  Skin cancer.  Lung cancer. What should I know about heart disease, diabetes, and high blood pressure? Blood pressure and heart disease  High blood pressure causes heart disease and increases the risk of stroke. This is more likely to develop in people who have high blood pressure readings, are of African descent, or are overweight.  Have your blood pressure checked: ? Every 3-5 years if you are 18-39 years of age. ? Every year if you are 40 years old or older. Diabetes Have regular diabetes screenings. This checks your fasting blood sugar level. Have the screening done:  Once every three years after age 40 if you are at a normal weight and have a low risk for diabetes.  More often and at a younger age if you are overweight or have a high risk for diabetes. What should I know about preventing infection? Hepatitis B If you have a higher risk for hepatitis B, you should be screened for this virus. Talk with your health care provider to find out if you are at risk for hepatitis B infection. Hepatitis C Testing is recommended for:  Everyone born from 1945 through 1965.  Anyone with known risk factors for hepatitis C.   Sexually transmitted infections (STIs)  Get screened for STIs, including gonorrhea and chlamydia, if: ? You are sexually active and are younger than 67 years of age. ? You are older than 67 years of age and your health care provider tells you that you are at risk for this type of infection. ? Your sexual activity has changed since you were last screened, and you are at increased risk for  chlamydia or gonorrhea. Ask your health care provider if you are at risk.  Ask your health care provider about whether you are at high risk for HIV. Your health care provider may recommend a prescription medicine to help prevent HIV infection. If you choose to take medicine to prevent HIV, you should first get tested for HIV. You should then be tested every 3 months for as long as you are taking the medicine. Pregnancy  If you are about to stop having your period (premenopausal) and you may become pregnant, seek counseling before you get pregnant.  Take 400 to 800 micrograms (mcg) of folic acid every day if you become pregnant.  Ask for birth control (contraception) if you want to prevent pregnancy. Osteoporosis and menopause Osteoporosis is a disease in which the bones lose minerals and strength with aging. This can result in bone fractures. If you are 65 years old or older, or if you are at risk for osteoporosis and fractures, ask your health care provider if you should:  Be screened for bone loss.  Take a calcium or vitamin D supplement to lower your risk of fractures.  Be given hormone replacement therapy (HRT) to treat symptoms of menopause. Follow these instructions at home: Lifestyle  Do not use any products that contain nicotine or tobacco, such as cigarettes, e-cigarettes, and chewing tobacco. If you need help quitting, ask your health care provider.  Do not use street drugs.  Do not share needles.  Ask your health care provider for help if you need support or information about quitting drugs. Alcohol use  Do not drink alcohol if: ? Your health care provider tells you not to drink. ? You are pregnant, may be pregnant, or are planning to become pregnant.  If you drink alcohol: ? Limit how much you use to 0-1 drink a day. ? Limit intake if you are breastfeeding.  Be aware of how much alcohol is in your drink. In the [place] one drink equals one 12 oz bottle of beer (355  mL), one 5 oz glass of wine (148 mL), or one 1 oz glass of hard liquor (44 mL). General instructions  Schedule regular health, dental, and eye exams.  Stay current with your vaccines.  Tell your health care provider if: ? You often feel depressed. ? You have ever been abused or do not feel safe at home. Summary  Adopting a healthy lifestyle and getting preventive care are important in promoting health and wellness.  Follow your health care provider's instructions about healthy diet, exercising, and getting tested or screened for diseases.  Follow your health care provider's instructions on monitoring your cholesterol and blood pressure. This information is not intended to replace advice given to you by your health care provider. Make sure you discuss any questions you have with your health care provider. Document Revised: 02/03/2018 Document Reviewed: 02/03/2018 [employer] Patient Education  2020 [employer]  

## 2019-11-03 NOTE — Progress Notes (Signed)
Subjective:    Patient ID: Alicia Wilkerson, female    DOB: 1951-01-11, 69 y.o.   MRN: [id number]  HPI She is here for a physical exam.   She had a total hysterectomy laparoscopically on 8/24.  BP at home has been good - 108/58  Overall she feels well.  Medications and allergies reviewed with patient and updated if appropriate.  Patient Active Problem List   Diagnosis Date Noted  . Aortic atherosclerosis ([place]) 11/04/2019  . Hyperlipidemia 11/03/2019  . CAD (coronary artery disease) 11/03/2019  . S/P total hysterectomy and bilateral salpingo-oophorectomy, 2021 11/03/2019  . Hyperglycemia 10/19/2018  . Abnormal EKG 04/26/2018  . Hearing loss 10/13/2017  . Basal cell carcinoma (BCC) of skin of lower extremity including hip 05/19/2017  . Anxiety 10/10/2016  . Eczema 10/10/2016  . Back spasm 10/10/2016  . H/O splenectomy 10/10/2016  . H/O basal cell carcinoma excision 10/10/2016  . Dysplasia of cervix, high grade CIN 2 08/21/2016    Current Outpatient Medications on File Prior to Visit  Medication Sig Dispense Refill  . clonazePAM (KLONOPIN) 0.5 MG tablet Take 1 tablet (0.5 mg total) by mouth daily as needed for anxiety. 30 tablet 2  . escitalopram (LEXAPRO) 5 MG tablet Take 5 mg by mouth daily.     . halobetasol (ULTRAVATE) 0.05 % cream Apply 1 application topically daily as needed (irritation).     Marland Kitchen losartan (COZAAR) 25 MG tablet Take 1 tablet (25 mg total) by mouth daily. 90 tablet 2  . rosuvastatin (CRESTOR) 20 MG tablet TAKE 1 TABLET BY MOUTH EVERY DAY (Patient taking differently: Take 20 mg by mouth daily. ) 90 tablet 1   No current facility-administered medications on file prior to visit.    Past Medical History:  Diagnosis Date  . Anemia   . Anxiety disorder   . Basal cell carcinoma (BCC) of left upper arm 12/2015  . Basal cell carcinoma (BCC) of right lower leg 04/2016  . Borderline hypertension   . CAD (coronary artery disease)    a. nonobst by cor CT  12/2018.  Marland Kitchen CIN II (cervical intraepithelial neoplasia II)    CIN  1  . Depression   . Eczema   . Family history of premature CAD   . History of basal cell carcinoma (BCC) excision    left upper arm 2017;   right lower leg 2018;  bilateral lower leg and hip area 2019;   inner right lower leg, outer right thigh, & upper outside left arm 09/ 2020  . History of cervical dysplasia 2013  . History of hereditary spherocytosis    s/p splenectomy in 1954  . History of hyperparathyroidism    per pt yrs ago was put on mega dose of vit d which caused the hyperparathyroid resolved after stopping vit d  . History of pelvic fracture 2013  . MAI (mycobacterium avium-intracellulare) ([place])    ? possibly by CT 12/2018  . Mild hyperlipidemia   . Osteoporosis   . PONV (postoperative nausea and vomiting)   . S/P splenectomy    spherocytosis  . Wears glasses   . White coat syndrome without diagnosis of hypertension     Past Surgical History:  Procedure Laterality Date  . ANKLE SURGERY  03/14/2015   . CERVICAL CONIZATION W/BX N/A 12/13/2018   Procedure: CONIZATION CERVIX WITH BIOPSY;  Surgeon: Megan Salon, MD;  Location: [place];  Service: Gynecology;  Laterality: N/A;  . CYSTOSCOPY N/A 10/18/2019   Procedure:  CYSTOSCOPY;  Surgeon: Megan Salon, MD;  Location: [facility];  Service: Gynecology;  Laterality: N/A;  . IR RADIOLOGY PERIPHERAL GUIDED IV START  01/05/2019  . IR US GUIDE VASC ACCESS RIGHT  01/05/2019  . KNEE SURGERY Left 1991   per pt retained hardward  . LEEP N/A 12/13/2018   Procedure: possible LOOP ELECTROSURGICAL EXCISION PROCEDURE (LEEP);  Surgeon: Megan Salon, MD;  Location: [place];  Service: Gynecology;  Laterality: N/A;  . ORIF ANKLE FRACTURE Left 03/14/2015   per pt retained hardware  . SPLENECTOMY, TOTAL  1954   due to spherocytosis  . TOTAL LAPAROSCOPIC HYSTERECTOMY WITH SALPINGECTOMY N/A 10/18/2019   Procedure: TOTAL LAPAROSCOPIC HYSTERECTOMY WITH BILATERAL  SALPINGECTOMY AND BILATERAL OOPHORECTOMY;  Surgeon: Megan Salon, MD;  Location[facility];  Service: Gynecology;  Laterality: N/A;  BILATERAL SALPINGECTOMY POSS OOPHORECTOMY  . TUBAL LIGATION Bilateral 1995    Social History   Socioeconomic History  . Marital status: Widowed    Spouse name: Not on file  . Number of children: Not on file  . Years of education: Not on file  . Highest education level: Not on file  Occupational History  . Not on file  Tobacco Use  . Smoking status: Never Smoker  . Smokeless tobacco: Never Used  Vaping Use  . Vaping Use: Never used  Substance and Sexual Activity  . Alcohol use: Not Currently  . Drug use: No  . Sexual activity: Not Currently    Partners: Male    Birth control/protection: Surgical, Post-menopausal    Comment: BTL & hysterectomy  Other Topics Concern  . Not on file  Social History Narrative  . Not on file   Social Determinants of Health   Financial Resource Strain: Low Risk   . Difficulty of Paying Living Expenses: Not hard at all  Food Insecurity:   . Worried About Charity fundraiser in the Last Year: Not on file  . Ran Out of Food in the Last Year: Not on file  Transportation Needs:   . Lack of Transportation (Medical): Not on file  . Lack of Transportation (Non-Medical): Not on file  Physical Activity:   . Days of Exercise per Week: Not on file  . Minutes of Exercise per Session: Not on file  Stress:   . Feeling of Stress : Not on file  Social Connections:   . Frequency of Communication with Friends and Family: Not on file  . Frequency of Social Gatherings with Friends and Family: Not on file  . Attends Religious Services: Not on file  . Active Member of Clubs or Organizations: Not on file  . Attends Archivist Meetings: Not on file  . Marital Status: Not on file    Family History  Problem Relation Age of Onset  . Heart failure Father   . Skin cancer Father   . Hypertension Father     . Heart disease Father   . Osteoporosis Mother   . Colon cancer Mother        deceased  . Heart disease Sister   . Crohn's disease Daughter     Review of Systems  Constitutional: Negative for chills and fever.  Eyes: Negative for visual disturbance.  Respiratory: Negative for cough, shortness of breath and wheezing.   Cardiovascular: Negative for chest pain, palpitations and leg swelling.  Gastrointestinal: Negative for abdominal pain, blood in stool, constipation, diarrhea and nausea.       No gerd  Genitourinary: Negative for dysuria and hematuria.  Musculoskeletal: Negative for arthralgias and back pain.  Skin: Negative for rash.  Neurological: Negative for light-headedness and headaches.  Psychiatric/Behavioral: Negative for dysphoric mood. The patient is nervous/anxious.        Objective:   Vitals:   11/04/19 0908  BP: (!) 142/78  Pulse: (!) 57  Temp: 98 F (36.7 C)  SpO2: 97%   Filed Weights   11/04/19 0908  Weight: 135 lb (61.2 kg)   Body mass index is 23.91 kg/m.  BP Readings from Last 3 Encounters:  11/04/19 (!) 142/78  10/27/19 108/68  10/18/19 (!) 113/56    Wt Readings from Last 3 Encounters:  11/04/19 135 lb (61.2 kg)  10/27/19 135 lb (61.2 kg)  10/18/19 136 lb 6.4 oz (61.9 kg)     Physical Exam Constitutional: She appears well-developed and well-nourished. No distress.  HENT:  Head: Normocephalic and atraumatic.  Right Ear: External ear normal. Normal ear canal and TM Left Ear: External ear normal.  Normal ear canal and TM Mouth/Throat: Oropharynx is clear and moist.  Eyes: Conjunctivae and EOM are normal.  Neck: Neck supple. No tracheal deviation present. No thyromegaly present.  No carotid bruit  Cardiovascular: Normal rate, regular rhythm and normal heart sounds.   No murmur heard.  No edema. Pulmonary/Chest: Effort normal and breath sounds normal. No respiratory distress. She has no wheezes. She has no rales.  Breast: deferred    Abdominal: Soft. She exhibits no distension. There is no tenderness.  Lymphadenopathy: She has no cervical adenopathy.  Skin: Skin is warm and dry. She is not diaphoretic.  Psychiatric: She has a normal mood and affect. Her behavior is normal.        Assessment & Plan:   Physical exam: Screening blood work    ordered Immunizations  Advised flu vaccine, Pneumovax today, discussed shingrix and covid booster Colonoscopy   Up to date  Mammogram  Up to date  Gyn  Up to date Dexa  deferred Eye exams  Up to date  Exercise  Walking  Weight  Good  Substance abuse   none  See Problem List for Assessment and Plan of chronic medical problems.   This visit occurred during the SARS-CoV-2 public health emergency.  Safety protocols were in place, including screening questions prior to the visit, additional usage of staff PPE, and extensive cleaning of exam room while observing appropriate contact time as indicated for disinfecting solutions.

## 2019-11-04 ENCOUNTER — Other Ambulatory Visit: Payer: Self-pay

## 2019-11-04 ENCOUNTER — Encounter: Payer: Self-pay | Admitting: Internal Medicine

## 2019-11-04 ENCOUNTER — Ambulatory Visit (INDEPENDENT_AMBULATORY_CARE_PROVIDER_SITE_OTHER): Payer: Medicare HMO | Admitting: Internal Medicine

## 2019-11-04 VITALS — BP 142/78 | HR 57 | Temp 98.0°F | Ht 63.0 in | Wt 135.0 lb

## 2019-11-04 DIAGNOSIS — I700 Atherosclerosis of aorta: Secondary | ICD-10-CM | POA: Diagnosis not present

## 2019-11-04 DIAGNOSIS — I2510 Atherosclerotic heart disease of native coronary artery without angina pectoris: Secondary | ICD-10-CM

## 2019-11-04 DIAGNOSIS — Z23 Encounter for immunization: Secondary | ICD-10-CM | POA: Diagnosis not present

## 2019-11-04 DIAGNOSIS — R69 Illness, unspecified: Secondary | ICD-10-CM | POA: Diagnosis not present

## 2019-11-04 DIAGNOSIS — E782 Mixed hyperlipidemia: Secondary | ICD-10-CM

## 2019-11-04 DIAGNOSIS — Z0000 Encounter for general adult medical examination without abnormal findings: Secondary | ICD-10-CM | POA: Diagnosis not present

## 2019-11-04 DIAGNOSIS — R739 Hyperglycemia, unspecified: Secondary | ICD-10-CM

## 2019-11-04 DIAGNOSIS — F419 Anxiety disorder, unspecified: Secondary | ICD-10-CM

## 2019-11-04 MED ORDER — CLONAZEPAM 0.5 MG PO TABS: 0.5000 mg | ORAL_TABLET | Freq: Every day | ORAL | 2 refills | Status: DC | PRN

## 2019-11-04 NOTE — Assessment & Plan Note (Signed)
Chronic Controlled, stable Continue current dose of medication lexapro 5 mg daily Takes clonazepam prn

## 2019-11-04 NOTE — Assessment & Plan Note (Signed)
Chronic Check lipid panel  Continue daily statin Regular exercise and healthy diet encouraged  

## 2019-11-04 NOTE — Assessment & Plan Note (Signed)
Chronic On crestor Stressed exercise - walking regularly Doing mediterranean diet

## 2019-11-04 NOTE — Assessment & Plan Note (Signed)
Chronic Following with Dr Meda Coffee Taking crestor, losartan Cbc, tsh, cmp, lipid

## 2019-11-04 NOTE — Assessment & Plan Note (Signed)
Chronic Check a1c Low sugar / carb diet Stressed regular exercise  

## 2019-11-05 LAB — CBC WITH DIFFERENTIAL/PLATELET
Absolute Monocytes: 884 cells/uL (ref 200–950)
Basophils Absolute: 72 cells/uL (ref 0–200)
Basophils Relative: 1.1 %
Eosinophils Absolute: 306 cells/uL (ref 15–500)
Eosinophils Relative: 4.7 %
HCT: 38.7 % (ref 35.0–45.0)
Hemoglobin: 13.3 g/dL (ref 11.7–15.5)
Lymphs Abs: 1788 cells/uL (ref 850–3900)
MCH: 34.2 pg — ABNORMAL HIGH (ref 27.0–33.0)
MCHC: 34.4 g/dL (ref 32.0–36.0)
MCV: 99.5 fL (ref 80.0–100.0)
MPV: 12.4 fL (ref 7.5–12.5)
Monocytes Relative: 13.6 %
Neutro Abs: 3452 cells/uL (ref 1500–7800)
Neutrophils Relative %: 53.1 %
Platelets: 314 10*3/uL (ref 140–400)
RBC: 3.89 10*6/uL (ref 3.80–5.10)
RDW: 14.2 % (ref 11.0–15.0)
Total Lymphocyte: 27.5 %
WBC: 6.5 10*3/uL (ref 3.8–10.8)

## 2019-11-05 LAB — HEMOGLOBIN A1C
Hgb A1c MFr Bld: 4 % of total Hgb (ref <5.7)
Mean Plasma Glucose: 68 (calc)
eAG (mmol/L): 3.8 (calc)

## 2019-11-05 LAB — COMPREHENSIVE METABOLIC PANEL
AG Ratio: 1.8 (calc) (ref 1.0–2.5)
ALT: 23 U/L (ref 6–29)
AST: 24 U/L (ref 10–35)
Albumin: 4.6 g/dL (ref 3.6–5.1)
Alkaline phosphatase (APISO): 94 U/L (ref 37–153)
BUN: 15 mg/dL (ref 7–25)
CO2: 25 mmol/L (ref 20–32)
Calcium: 9.7 mg/dL (ref 8.6–10.4)
Chloride: 105 mmol/L (ref 98–110)
Creat: 0.71 mg/dL (ref 0.50–0.99)
Globulin: 2.5 g/dL (calc) (ref 1.9–3.7)
Glucose, Bld: 94 mg/dL (ref 65–99)
Potassium: 4.4 mmol/L (ref 3.5–5.3)
Sodium: 140 mmol/L (ref 135–146)
Total Bilirubin: 1.4 mg/dL — ABNORMAL HIGH (ref 0.2–1.2)
Total Protein: 7.1 g/dL (ref 6.1–8.1)

## 2019-11-05 LAB — LIPID PANEL
Cholesterol: 149 mg/dL (ref <200)
HDL: 69 mg/dL (ref >=50)
LDL Cholesterol (Calc): 67 mg/dL (calc)
Non-HDL Cholesterol (Calc): 80 mg/dL (calc) (ref <130)
Total CHOL/HDL Ratio: 2.2 (calc) (ref <5.0)
Triglycerides: 57 mg/dL (ref <150)

## 2019-11-05 LAB — TSH: TSH: 2.05 mIU/L (ref 0.40–4.50)

## 2019-11-18 ENCOUNTER — Ambulatory Visit (INDEPENDENT_AMBULATORY_CARE_PROVIDER_SITE_OTHER): Payer: Medicare HMO

## 2019-11-18 DIAGNOSIS — Z0000 Encounter for general adult medical examination without abnormal findings: Secondary | ICD-10-CM

## 2019-11-18 NOTE — Progress Notes (Signed)
I connected with [name] today by telephone and verified that I am speaking with the correct person using two identifiers. Location patient: home Location provider: work Persons participating in the virtual visit: Chaunte Hornbeck and [employer]. Lark Langenfeld, LPN.   I discussed the limitations, risks, security and privacy concerns of performing an evaluation and management service by telephone and the availability of in person appointments. I also discussed with the patient that there may be a patient responsible charge related to this service. The patient expressed understanding and verbally consented to this telephonic visit.    Interactive audio and video telecommunications were attempted between this provider and patient, however failed, due to patient having technical difficulties OR patient did not have access to video capability.  We continued and completed visit with audio only.  Some vital signs may be absent or patient reported.   Time Spent with patient on telephone encounter: 20 minutes  Subjective:   Alicia Wilkerson is a 69 y.o. female who presents for Medicare Annual (Subsequent) preventive examination.  Review of Systems    No ROS. Medicare Wellness Visit. Cardiac Risk Factors include: advanced age (>46men, >65 women);dyslipidemia;family history of premature cardiovascular disease     Objective:    There were no vitals filed for this visit. There is no height or weight on file to calculate BMI.  Advanced Directives 11/18/2019 10/18/2019 10/04/2019 12/03/2018  Does Patient Have a Medical Advance Directive? Yes Yes Yes Yes  Type of Paramedic of [place];Living will [place];Living will [place];Living will [place];Living will  Copy of [place] in Chart? No - copy requested No - copy requested - No - copy requested    Current Medications (verified) Outpatient Encounter  Medications as of 11/18/2019  Medication Sig  . clonazePAM (KLONOPIN) 0.5 MG tablet Take 1 tablet (0.5 mg total) by mouth daily as needed for anxiety.  Marland Kitchen escitalopram (LEXAPRO) 5 MG tablet Take 5 mg by mouth daily.   . halobetasol (ULTRAVATE) 0.05 % cream Apply 1 application topically daily as needed (irritation).   Marland Kitchen losartan (COZAAR) 25 MG tablet Take 1 tablet (25 mg total) by mouth daily.  . rosuvastatin (CRESTOR) 20 MG tablet TAKE 1 TABLET BY MOUTH EVERY DAY (Patient taking differently: Take 20 mg by mouth daily. )   No facility-administered encounter medications on file as of 11/18/2019.    Allergies (verified) Penicillins and Sulfa antibiotics   History: Past Medical History:  Diagnosis Date  . Anemia   . Anxiety disorder   . Basal cell carcinoma (BCC) of left upper arm 12/2015  . Basal cell carcinoma (BCC) of right lower leg 04/2016  . Borderline hypertension   . CAD (coronary artery disease)    a. nonobst by cor CT 12/2018.  Marland Kitchen CIN II (cervical intraepithelial neoplasia II)    CIN  1  . Depression   . Eczema   . Family history of premature CAD   . History of basal cell carcinoma (BCC) excision    left upper arm 2017;   right lower leg 2018;  bilateral lower leg and hip area 2019;   inner right lower leg, outer right thigh, & upper outside left arm 09/ 2020  . History of cervical dysplasia 2013  . History of hereditary spherocytosis    s/p splenectomy in 1954  . History of hyperparathyroidism    per pt yrs ago was put on mega dose of vit d  which caused the hyperparathyroid resolved after stopping vit d  . History of pelvic fracture 2013  . MAI (mycobacterium avium-intracellulare) ([place])    ? possibly by CT 12/2018  . Mild hyperlipidemia   . Osteoporosis   . PONV (postoperative nausea and vomiting)   . S/P splenectomy    spherocytosis  . Wears glasses   . White coat syndrome without diagnosis of hypertension    Past Surgical History:  Procedure Laterality Date  .  ANKLE SURGERY  03/14/2015   . CERVICAL CONIZATION W/BX N/A 12/13/2018   Procedure: CONIZATION CERVIX WITH BIOPSY;  Surgeon: Megan Salon, MD;  Location: [facility];  Service: Gynecology;  Laterality: N/A;  . CYSTOSCOPY N/A 10/18/2019   Procedure: CYSTOSCOPY;  Surgeon: Megan Salon, MD;  Location: [facility];  Service: Gynecology;  Laterality: N/A;  . IR RADIOLOGY PERIPHERAL GUIDED IV START  01/05/2019  . IR US GUIDE VASC ACCESS RIGHT  01/05/2019  . KNEE SURGERY Left 1991   per pt retained hardward  . LEEP N/A 12/13/2018   Procedure: possible LOOP ELECTROSURGICAL EXCISION PROCEDURE (LEEP);  Surgeon: Megan Salon, MD;  Location: [place];  Service: Gynecology;  Laterality: N/A;  . ORIF ANKLE FRACTURE Left 03/14/2015   per pt retained hardware  . SPLENECTOMY, TOTAL  1954   due to spherocytosis  . TOTAL LAPAROSCOPIC HYSTERECTOMY WITH SALPINGECTOMY N/A 10/18/2019   Procedure: TOTAL LAPAROSCOPIC HYSTERECTOMY WITH BILATERAL SALPINGECTOMY AND BILATERAL OOPHORECTOMY;  Surgeon: Megan Salon, MD;  Location: [facility];  Service: Gynecology;  Laterality: N/A;  BILATERAL SALPINGECTOMY POSS OOPHORECTOMY  . TUBAL LIGATION Bilateral 1995   Family History  Problem Relation Age of Onset  . Heart failure Father   . Skin cancer Father   . Hypertension Father   . Heart disease Father   . Osteoporosis Mother   . Colon cancer Mother        deceased  . Heart disease Sister   . Crohn's disease Daughter    Social History   Socioeconomic History  . Marital status: Widowed    Spouse name: Not on file  . Number of children: Not on file  . Years of education: Not on file  . Highest education level: Not on file  Occupational History  . Not on file  Tobacco Use  . Smoking status: Never Smoker  . Smokeless tobacco: Never Used  Vaping Use  . Vaping Use: Never used  Substance and Sexual Activity  . Alcohol use: Not Currently  . Drug use: No  . Sexual activity: Not Currently     Partners: Male    Birth control/protection: Surgical, Post-menopausal    Comment: BTL & hysterectomy  Other Topics Concern  . Not on file  Social History Narrative  . Not on file   Social Determinants of Health   Financial Resource Strain: Low Risk   . Difficulty of Paying Living Expenses: Not hard at all  Food Insecurity: No Food Insecurity  . Worried About Charity fundraiser in the Last Year: Never true  . Ran Out of Food in the Last Year: Never true  Transportation Needs: No Transportation Needs  . Lack of Transportation (Medical): No  . Lack of Transportation (Non-Medical): No  Physical Activity: Sufficiently Active  . Days of Exercise per Week: 5 days  . Minutes of Exercise per Session: 30 min  Stress:   . Feeling of Stress : Not on file  Social Connections: Unknown  . Frequency of  Communication with Friends and Family: More than three times a week  . Frequency of Social Gatherings with Friends and Family: Never  . Attends Religious Services: Never  . Active Member of Clubs or Organizations: No  . Attends Archivist Meetings: Never  . Marital Status: Patient refused    Tobacco Counseling Counseling given: Not Answered   Clinical Intake:  Pre-visit preparation completed: Yes  Pain : No/denies pain     Nutritional Risks: None Diabetes: No  How often do you need to have someone help you when you read instructions, pamphlets, or other written materials from your doctor or pharmacy?: 1 - Never What is the last grade level you completed in school?: Bachelor's Degree  Diabetic? no  Interpreter Needed?: No  Information entered by :: Loyda Costin N. Deston Bilyeu, LPN   Activities of Daily Living In your present state of health, do you have any difficulty performing the following activities: 11/18/2019 10/18/2019  Hearing? N N  Vision? N N  Difficulty concentrating or making decisions? N N  Walking or climbing stairs? N N  Dressing or bathing? N N  Doing  errands, shopping? N -  Preparing Food and eating ? N -  Using the Toilet? N -  In the past six months, have you accidently leaked urine? N -  Do you have problems with loss of bowel control? N -  Managing your Medications? N -  Managing your Finances? N -  Housekeeping or managing your Housekeeping? N -  Some recent data might be hidden    Patient Care Team: Binnie Rail, MD as PCP - General (Internal Medicine) Dorothy Spark, MD as PCP - Cardiology (Cardiology) Charlton Haws, [facility] as Pharmacist (Pharmacist) Megan Salon, MD as Consulting Physician (Gynecology)  Indicate any recent Medical Services you may have received from other than [facility] providers in the past year (date may be approximate).     Assessment:   This is a routine wellness examination for Alicia Wilkerson.  Hearing/Vision screen No exam data present  Dietary issues and exercise activities discussed: Current Exercise Habits: Home exercise routine, Type of exercise: walking;Other - see comments (staionary bike riding), Time (Minutes): 30, Frequency (Times/Week): 5, Weekly Exercise (Minutes/Week): 150, Intensity: Moderate, Exercise limited by: None identified  Goals    . Pharmacy Care Plan     CARE PLAN ENTRY (see longitudinal plan of care for additional care plan information)  Current Barriers:  . Chronic Disease Management support, education, and care coordination needs related to Hypertension, Hyperlipidemia, Coronary Artery Disease, and Anxiety   Hypertension BP Readings from Last 3 Encounters:  09/29/19 110/64  09/08/19 (!) 142/80  08/05/19 118/78 .  Pharmacist Clinical Goal(s): o Over the next 180 days, patient will work with PharmD and providers to maintain BP goal <130/80 . Current regimen:  o Losartan 25 mg daily . Interventions: o Discussed BP goals and benefits of medication for prevention of heart attack / stroke . Patient self care activities - Over the next 180 days, patient will: o Check  BP 90, document, and provide at future appointments o Ensure daily salt intake < 2300 mg/day  Hyperlipidemia / CAD Lab Results  Component Value Date/Time   LDLCALC 48 03/25/2019 08:29 AM .  Pharmacist Clinical Goal(s): o Over the next 180 days, patient will work with PharmD and providers to maintain LDL goal < 70 . Current regimen:  o Rosuvastatin 20 mg daily o Aspirin 81 mg daily o OTC omega 3  - 2  capsules daily . Interventions: o Discussed cholesterol goals and benefits of medication for prevention of heart attack / stroke . Patient self care activities - Over the next 180 days, patient will: o Continue current medications o Continue exercise routine and diet  Anxiety . Pharmacist Clinical Goal(s) o Over the next 180 days, patient will work with PharmD and providers to optimize therapy . Current regimen:  o Escitalopram 5 mg daily o Clonazepam 0.5 mg daily PRN . Interventions: o Discussed benefits of escitalopram for anxiety management, and role of clonazepam for breakthrough anxiety o Discussed ability to increase escitalopram dose if needed in the future . Patient self care activities - Over the next 180 days, patient will: o Continue medication as prescribed o Follow up with PCP as scheduled  Medication management . Pharmacist Clinical Goal(s): o Over the next 180 days, patient will work with PharmD and providers to maintain optimal medication adherence . Current pharmacy: [facility] . Interventions o Comprehensive medication review performed. o Continue current medication management strategy . Patient self care activities - Over the next 180 days, patient will: o Focus on medication adherence by fill date o Take medications as prescribed o Report any questions or concerns to PharmD and/or provider(s)  Initial goal documentation      Depression Screen PHQ 2/9 Scores 11/18/2019 04/26/2018 10/13/2017 09/01/2016 03/06/2015  PHQ - 2 Score 0 0 0 0 0  PHQ- 9 Score - 0 - - -      Fall Risk Fall Risk  11/18/2019 10/13/2017 09/01/2016  Falls in the past year? 0 Yes No  Number falls in past yr: - 1 -  Injury with Fall? 0 No -  Risk for fall due to : No Fall Risks Other (Comment) -  Follow up Falls evaluation completed Falls evaluation completed -    Any stairs in or around the home? Yes  If so, are there any without handrails? No  Home free of loose throw rugs in walkways, pet beds, electrical cords, etc? Yes  Adequate lighting in your home to reduce risk of falls? Yes   ASSISTIVE DEVICES UTILIZED TO PREVENT FALLS:  Life alert? No  Use of a cane, walker or w/c? No  Grab bars in the bathroom? No  Shower chair or bench in shower? No  Elevated toilet seat or a handicapped toilet? No   TIMED UP AND GO:  Was the test performed? No .  Length of time to ambulate 10 feet: 0 sec.   Gait steady and fast without use of assistive device  Cognitive Function: Patient is cogitatively intact.        Immunizations Immunization History  Administered Date(s) Administered  . Fluad Quad(high Dose 65+) 10/20/2018  . Influenza Inj Mdck Quad Pf 12/18/2016  . Influenza, High Dose Seasonal PF 11/11/2017  . Influenza-Unspecified 11/30/2014, 11/11/2017  . Moderna SARS-COVID-2 Vaccination 03/26/2019, 04/25/2019  . Pneumococcal Conjugate-13 04/26/2018  . Pneumococcal Polysaccharide-23 11/04/2019  . Tdap 02/25/2008, 08/21/2016    TDAP status: Up to date Flu Vaccine status: Up to date Pneumococcal vaccine status: Up to date Covid-19 vaccine status: Completed vaccines  Qualifies for Shingles Vaccine? Yes   Zostavax completed Yes   Shingrix Completed?: No.    Education has been provided regarding the importance of this vaccine. Patient has been advised to call insurance company to determine out of pocket expense if they have not yet received this vaccine. Advised may also receive vaccine at local pharmacy or Health Dept. Verbalized acceptance and understanding.  Screening  Tests Health Maintenance  Topic Date Due  . INFLUENZA VACCINE  09/25/2019  . DEXA SCAN  10/19/2028 (Originally 01/04/2017)  . MAMMOGRAM  07/03/2021  . COLONOSCOPY  05/24/2023  . TETANUS/TDAP  08/22/2026  . COVID-19 Vaccine  Completed  . Hepatitis C Screening  Completed  . PNA vac Low Risk Adult  Completed    Health Maintenance  Health Maintenance Due  Topic Date Due  . INFLUENZA VACCINE  09/25/2019    Colorectal cancer screening: Completed 05/23/2013. Repeat every 10 years Mammogram status: Completed 07/04/2019. Repeat every year Bone Density status: Completed 01/05/2015. Results reflect: Bone density results: OSTEOPOROSIS. Repeat every 2 years. Patient declined to follow-up.  Lung Cancer Screening: (Low Dose CT Chest recommended if Age 2-80 years, 30 pack-year currently smoking OR have quit w/in 15years.) does not qualify.   Lung Cancer Screening Referral: no  Additional Screening:  Hepatitis C Screening: does qualify; Completed yes  Vision Screening: Recommended annual ophthalmology exams for early detection of glaucoma and other disorders of the eye. Is the patient up to date with their annual eye exam?  Yes  Who is the provider or what is the name of the office in which the patient attends annual eye exams? [facility] If pt is not established with a provider, would they like to be referred to a provider to establish care? No .   Dental Screening: Recommended annual dental exams for proper oral hygiene  Community Resource Referral / Chronic Care Management: CRR required this visit?  No   CCM required this visit?  No      Plan:     I have personally reviewed and noted the following in the patient's chart:   . Medical and social history . Use of alcohol, tobacco or illicit drugs  . Current medications and supplements . Functional ability and status . Nutritional status . Physical activity . Advanced directives . List of other physicians . Hospitalizations,  surgeries, and ER visits in previous 12 months . Vitals . Screenings to include cognitive, depression, and falls . Referrals and appointments  In addition, I have reviewed and discussed with patient certain preventive protocols, quality metrics, and best practice recommendations. A written personalized care plan for preventive services as well as general preventive health recommendations were provided to patient.     Sheral Flow, LPN   [id number]/56/[id number]   Nurse Notes:  Patient is cogitatively intact. There were no vitals filed for this visit. There is no height or weight on file to calculate BMI. Patient stated that she has no issues with gait or balance; does not use any assistive devices.

## 2019-11-18 NOTE — Patient Instructions (Addendum)
Alicia Wilkerson , Thank you for taking time to come for your Medicare Wellness Visit. I appreciate your ongoing commitment to your health goals. Please review the following plan we discussed and let me know if I can assist you in the future.   Screening recommendations/referrals: Colonoscopy: 05/23/2013; due every 10 years Mammogram: 07/04/2019 Bone Density: declined Recommended yearly ophthalmology/optometry visit for glaucoma screening and checkup Recommended yearly dental visit for hygiene and checkup  Vaccinations: Influenza vaccine: 10/20/2018 Pneumococcal vaccine: up to date Tdap vaccine: 08/21/2016; due every 10 years Shingles vaccine: never done    Covid-19: completed  Advanced directives: Please bring a copy of your health care power of attorney and living will to the office at your convenience.  Conditions/risks identified: Yes; Reviewed health maintenance screenings with patient today and relevant education, vaccines, and/or referrals were provided. Please continue to do your personal lifestyle choices by: daily care of teeth and gums, regular physical activity (goal should be 5 days a week for 30 minutes), eat a healthy diet, avoid tobacco and drug use, limiting any alcohol intake, taking a low-dose aspirin (if not allergic or have been advised by your provider otherwise) and taking vitamins and minerals as recommended by your provider. Continue doing brain stimulating activities (puzzles, reading, adult coloring books, staying active) to keep memory sharp. Continue to eat heart healthy diet (full of fruits, vegetables, whole grains, lean protein, water--limit salt, fat, and sugar intake) and increase physical activity as tolerated.  Next appointment:  Please schedule your next Medicare Wellness Visit with your Nurse Health Advisor in 1 year by calling [phone].   Preventive Care 78 Years and Older, Female Preventive care refers to lifestyle choices and visits with your health care  provider that can promote health and wellness. What does preventive care include?  A yearly physical exam. This is also called an annual well check.  Dental exams once or twice a year.  Routine eye exams. Ask your health care provider how often you should have your eyes checked.  Personal lifestyle choices, including:  Daily care of your teeth and gums.  Regular physical activity.  Eating a healthy diet.  Avoiding tobacco and drug use.  Limiting alcohol use.  Practicing safe sex.  Taking low-dose aspirin every day.  Taking vitamin and mineral supplements as recommended by your health care provider. What happens during an annual well check? The services and screenings done by your health care provider during your annual well check will depend on your age, overall health, lifestyle risk factors, and family history of disease. Counseling  Your health care provider may ask you questions about your:  Alcohol use.  Tobacco use.  Drug use.  Emotional well-being.  Home and relationship well-being.  Sexual activity.  Eating habits.  History of falls.  Memory and ability to understand (cognition).  Work and work Statistician.  Reproductive health. Screening  You may have the following tests or measurements:  Height, weight, and BMI.  Blood pressure.  Lipid and cholesterol levels. These may be checked every 5 years, or more frequently if you are over 30 years old.  Skin check.  Lung cancer screening. You may have this screening every year starting at age 40 if you have a 30-pack-year history of smoking and currently smoke or have quit within the past 15 years.  Fecal occult blood test (FOBT) of the stool. You may have this test every year starting at age 41.  Flexible sigmoidoscopy or colonoscopy. You may have a sigmoidoscopy every 5  years or a colonoscopy every 10 years starting at age 69.  Hepatitis C blood test.  Hepatitis B blood test.  Sexually  transmitted disease (STD) testing.  Diabetes screening. This is done by checking your blood sugar (glucose) after you have not eaten for a while (fasting). You may have this done every 1-3 years.  Bone density scan. This is done to screen for osteoporosis. You may have this done starting at age 73.  Mammogram. This may be done every 1-2 years. Talk to your health care provider about how often you should have regular mammograms. Talk with your health care provider about your test results, treatment options, and if necessary, the need for more tests. Vaccines  Your health care provider may recommend certain vaccines, such as:  Influenza vaccine. This is recommended every year.  Tetanus, diphtheria, and acellular pertussis (Tdap, Td) vaccine. You may need a Td booster every 10 years.  Zoster vaccine. You may need this after age 38.  Pneumococcal 13-valent conjugate (PCV13) vaccine. One dose is recommended after age 77.  Pneumococcal polysaccharide (PPSV23) vaccine. One dose is recommended after age 40. Talk to your health care provider about which screenings and vaccines you need and how often you need them. This information is not intended to replace advice given to you by your health care provider. Make sure you discuss any questions you have with your health care provider. Document Released: 03/09/2015 Document Revised: 10/31/2015 Document Reviewed: 12/12/2014 Elsevier Interactive Patient Education  2017 [employer] Prevention in the Home Falls can cause injuries. They can happen to people of all ages. There are many things you can do to make your home safe and to help prevent falls. What can I do on the outside of my home?  Regularly fix the edges of walkways and driveways and fix any cracks.  Remove anything that might make you trip as you walk through a door, such as a raised step or threshold.  Trim any bushes or trees on the path to your home.  Use bright outdoor  lighting.  Clear any walking paths of anything that might make someone trip, such as rocks or tools.  Regularly check to see if handrails are loose or broken. Make sure that both sides of any steps have handrails.  Any raised decks and porches should have guardrails on the edges.  Have any leaves, snow, or ice cleared regularly.  Use sand or salt on walking paths during winter.  Clean up any spills in your garage right away. This includes oil or grease spills. What can I do in the bathroom?  Use night lights.  Install grab bars by the toilet and in the tub and shower. Do not use towel bars as grab bars.  Use non-skid mats or decals in the tub or shower.  If you need to sit down in the shower, use a plastic, non-slip stool.  Keep the floor dry. Clean up any water that spills on the floor as soon as it happens.  Remove soap buildup in the tub or shower regularly.  Attach bath mats securely with double-sided non-slip rug tape.  Do not have throw rugs and other things on the floor that can make you trip. What can I do in the bedroom?  Use night lights.  Make sure that you have a light by your bed that is easy to reach.  Do not use any sheets or blankets that are too big for your bed. They should not hang  down onto the floor.  Have a firm chair that has side arms. You can use this for support while you get dressed.  Do not have throw rugs and other things on the floor that can make you trip. What can I do in the kitchen?  Clean up any spills right away.  Avoid walking on wet floors.  Keep items that you use a lot in easy-to-reach places.  If you need to reach something above you, use a strong step stool that has a grab bar.  Keep electrical cords out of the way.  Do not use floor polish or wax that makes floors slippery. If you must use wax, use non-skid floor wax.  Do not have throw rugs and other things on the floor that can make you trip. What can I do with my  stairs?  Do not leave any items on the stairs.  Make sure that there are handrails on both sides of the stairs and use them. Fix handrails that are broken or loose. Make sure that handrails are as long as the stairways.  Check any carpeting to make sure that it is firmly attached to the stairs. Fix any carpet that is loose or worn.  Avoid having throw rugs at the top or bottom of the stairs. If you do have throw rugs, attach them to the floor with carpet tape.  Make sure that you have a light switch at the top of the stairs and the bottom of the stairs. If you do not have them, ask someone to add them for you. What else can I do to help prevent falls?  Wear shoes that:  Do not have high heels.  Have rubber bottoms.  Are comfortable and fit you well.  Are closed at the toe. Do not wear sandals.  If you use a stepladder:  Make sure that it is fully opened. Do not climb a closed stepladder.  Make sure that both sides of the stepladder are locked into place.  Ask someone to hold it for you, if possible.  Clearly mark and make sure that you can see:  Any grab bars or handrails.  First and last steps.  Where the edge of each step is.  Use tools that help you move around (mobility aids) if they are needed. These include:  Canes.  Walkers.  Scooters.  Crutches.  Turn on the lights when you go into a dark area. Replace any light bulbs as soon as they burn out.  Set up your furniture so you have a clear path. Avoid moving your furniture around.  If any of your floors are uneven, fix them.  If there are any pets around you, be aware of where they are.  Review your medicines with your doctor. Some medicines can make you feel dizzy. This can increase your chance of falling. Ask your doctor what other things that you can do to help prevent falls. This information is not intended to replace advice given to you by your health care provider. Make sure you discuss any  questions you have with your health care provider. Document Released: 12/07/2008 Document Revised: 07/19/2015 Document Reviewed: 03/17/2014 Elsevier Interactive Patient Education  2017 [employer].

## 2019-11-21 DIAGNOSIS — R69 Illness, unspecified: Secondary | ICD-10-CM | POA: Diagnosis not present

## 2019-11-28 NOTE — Progress Notes (Signed)
Post Operative Visit  Procedure: total laparoscopic hysterectomy with bilateral salpingectomy/oopherectomy Days Post-op: 6 weeks  Subjective: Doing well.  Denies vaginal bleeding.  Is back doing all her normal activities.  Bowel movement and bladder function is normal.  Activities discussed.  Just feels great.  States she did all that worrying and she really didn't need to do it.  Final pathology of cervix was normal but given prior hx feel repeat HR HPV testing should be done in 1 year.  Pt very comfortable with this plan.  Objective: BP 110/64   Pulse 68   Resp 16   Wt 134 lb (60.8 kg)   LMP 02/24/2002   BMI 23.74 kg/m   EXAM General: alert and no distress Resp: clear to auscultation bilaterally Cardio: regular rate and rhythm, S1, S2 normal, no murmur, click, rub or gallop GI: soft, non-tender; bowel sounds normal; no masses,  no organomegaly and incision: clean, dry and intact Extremities: extremities normal, atraumatic, no cyanosis or edema Vaginal Bleeding: none  Gyn:  NAEFG, cuff healing well.    Assessment: s/p TLH/BSO, cystoscopy due to recurrent abnormal pap smears  Plan: Plan recheck 1 year with repeat HR HPV based testing at that time.  Questions answered.  Pt release to normal activity but pelvic rest recommended for another 6 weeks to make a full 12 weeks post op.

## 2019-11-29 ENCOUNTER — Other Ambulatory Visit: Payer: Self-pay

## 2019-11-29 ENCOUNTER — Encounter: Payer: Self-pay | Admitting: Obstetrics & Gynecology

## 2019-11-29 ENCOUNTER — Ambulatory Visit (INDEPENDENT_AMBULATORY_CARE_PROVIDER_SITE_OTHER): Payer: Medicare HMO | Admitting: Obstetrics & Gynecology

## 2019-11-29 VITALS — BP 110/64 | HR 68 | Resp 16 | Wt 134.0 lb

## 2019-11-29 DIAGNOSIS — D2272 Melanocytic nevi of left lower limb, including hip: Secondary | ICD-10-CM | POA: Diagnosis not present

## 2019-11-29 DIAGNOSIS — L738 Other specified follicular disorders: Secondary | ICD-10-CM | POA: Diagnosis not present

## 2019-11-29 DIAGNOSIS — D225 Melanocytic nevi of trunk: Secondary | ICD-10-CM | POA: Diagnosis not present

## 2019-11-29 DIAGNOSIS — Z9889 Other specified postprocedural states: Secondary | ICD-10-CM

## 2019-11-29 DIAGNOSIS — Z85828 Personal history of other malignant neoplasm of skin: Secondary | ICD-10-CM | POA: Diagnosis not present

## 2019-11-29 DIAGNOSIS — N871 Moderate cervical dysplasia: Secondary | ICD-10-CM

## 2019-11-29 DIAGNOSIS — D485 Neoplasm of uncertain behavior of skin: Secondary | ICD-10-CM | POA: Diagnosis not present

## 2019-11-30 DIAGNOSIS — R69 Illness, unspecified: Secondary | ICD-10-CM | POA: Diagnosis not present

## 2019-12-01 ENCOUNTER — Ambulatory Visit: Payer: Medicare HMO | Admitting: Obstetrics & Gynecology

## 2020-01-01 ENCOUNTER — Other Ambulatory Visit: Payer: Self-pay | Admitting: Cardiology

## 2020-01-04 ENCOUNTER — Telehealth: Payer: Self-pay | Admitting: Pharmacist

## 2020-01-04 NOTE — Progress Notes (Addendum)
Chronic Care Management Pharmacy Assistant   Name: LAFAWN LENOIR  MRN: [id number] DOB: 1950/04/09  Reason for Encounter: Hypertension Adherence Call  Patient Questions:  1.  Have you seen any other providers since your last visit? The patient has seen Dr. Hale Bogus Gynecology on 10/27/19, 11/29/19 and Dr. Billey Gosling on 11/04/19  2.  Any changes in your medicines or health? No    PCP : Binnie Rail, MD  Allergies:   Allergies  Allergen Reactions   Penicillins Rash    Did it involve swelling of the face/tongue/throat, SOB, or low BP? No Did it involve sudden or severe rash/hives, skin peeling, or any reaction on the inside of your mouth or nose?  #  #  #  YES  #  #  #  Did you need to seek medical attention at a hospital or doctor's office? #  #  #  YES  #  #  #  When did it last happen?years ago If all above answers are "NO", may proceed with cephalosporin use.    Sulfa Antibiotics Hives    Medications: Outpatient Encounter Medications as of 01/04/2020  Medication Sig   ASPIRIN 81 PO Take by mouth.   clonazePAM (KLONOPIN) 0.5 MG tablet Take 1 tablet (0.5 mg total) by mouth daily as needed for anxiety.   escitalopram (LEXAPRO) 5 MG tablet Take 5 mg by mouth daily.    halobetasol (ULTRAVATE) 0.05 % cream Apply 1 application topically daily as needed (irritation).    losartan (COZAAR) 25 MG tablet Take 1 tablet (25 mg total) by mouth daily.   Omega-3 Fatty Acids (FISH OIL PO) Take by mouth.   rosuvastatin (CRESTOR) 20 MG tablet TAKE 1 TABLET BY MOUTH EVERY DAY   No facility-administered encounter medications on file as of 01/04/2020.    Current Diagnosis: Patient Active Problem List   Diagnosis Date Noted   Aortic atherosclerosis ([facility]) 11/04/2019   Hyperlipidemia 11/03/2019   CAD (coronary artery disease) 11/03/2019   S/P total hysterectomy and bilateral salpingo-oophorectomy, 2021 11/03/2019   Hyperglycemia 10/19/2018   Abnormal EKG 04/26/2018    Hearing loss 10/13/2017   Basal cell carcinoma (BCC) of skin of lower extremity including hip 05/19/2017   Anxiety 10/10/2016   Eczema 10/10/2016   Back spasm 10/10/2016   H/O splenectomy 10/10/2016   H/O basal cell carcinoma excision 10/10/2016   Dysplasia of cervix, high grade CIN 2 08/21/2016    Goals Addressed   None     Follow-Up:  Pharmacist Review   Reviewed chart prior to disease state call. Spoke with patient regarding BP  Recent Office Vitals: BP Readings from Last 3 Encounters:  11/29/19 110/64  11/04/19 (!) 142/78  10/27/19 108/68   Pulse Readings from Last 3 Encounters:  11/29/19 68  11/04/19 (!) 57  10/27/19 68    Wt Readings from Last 3 Encounters:  11/29/19 134 lb (60.8 kg)  11/04/19 135 lb (61.2 kg)  10/27/19 135 lb (61.2 kg)     Kidney Function Lab Results  Component Value Date/Time   CREATININE 0.71 11/04/2019 09:55 AM   CREATININE 0.85 10/04/2019 02:36 PM   CREATININE 0.75 12/03/2018 06:56 AM   CREATININE 0.76 04/28/2014 11:24 AM   GFR 83.59 10/20/2018 02:56 PM   GFRNONAA >60 10/04/2019 02:36 PM   GFRAA >60 10/04/2019 02:36 PM    BMP Latest Ref Rng & Units 11/04/2019 10/04/2019 12/03/2018  Glucose 65 - 99 mg/dL 94 98 104(H)  BUN 7 -  25 mg/dL 15 12 19   Creatinine 0.50 - 0.99 mg/dL 0.71 0.85 0.75  BUN/Creat Ratio 6 - 22 (calc) NOT APPLICABLE - -  Sodium 774 - 146 mmol/L 140 142 139  Potassium 3.5 - 5.3 mmol/L 4.4 4.9 4.2  Chloride 98 - 110 mmol/L 105 105 102  CO2 20 - 32 mmol/L 25 28 24   Calcium 8.6 - 10.4 mg/dL 9.7 9.8 9.7     Current antihypertensive regimen:  o Losartan 25 mg daily   How often are you checking your Blood Pressure? The patient does not check her blood pressure every day but on occassions    Current home BP readings: The patient checked her blood pressure the on 01/02/20 and it was 101/73    What recent interventions/DTPs have been made by any provider to improve Blood Pressure control since last CPP  Visit: None   Any recent hospitalizations or ED visits since last visit with CPP? The patient has not been to the hospital or the ED    What diet changes have been made to improve Blood Pressure Control? The patient follows a mediterranean diet, watches what she eats, cut out salt, no sweets.   What exercise is being done to improve your Blood Pressure Control? The patient walks several times a day for about an hour   Adherence Review: Is the patient currently on ACE/ARB medication? Losartan Does the patient have >5 day gap between last estimated fill dates? No    Wendy Poet, Clinical Pharmacist Assistant [facility]

## 2020-01-17 ENCOUNTER — Encounter: Payer: Self-pay | Admitting: Plastic Surgery

## 2020-01-17 ENCOUNTER — Ambulatory Visit (INDEPENDENT_AMBULATORY_CARE_PROVIDER_SITE_OTHER): Payer: Medicare HMO | Admitting: Plastic Surgery

## 2020-01-17 ENCOUNTER — Other Ambulatory Visit: Payer: Self-pay

## 2020-01-17 DIAGNOSIS — L989 Disorder of the skin and subcutaneous tissue, unspecified: Secondary | ICD-10-CM

## 2020-01-17 NOTE — Progress Notes (Signed)
   Subjective:    Patient ID: Alicia Wilkerson, female    DOB: 05/05/1950, 69 y.o.   MRN: [id number]  The patient is a 69 year old female here for evaluation of changing skin lesions.  She is known to the practice and has had several areas removed in the past.  She has had a long history of basal and squamous cell carcinoma.  She has 2 areas on her left leg that have been changing.  The dermatologist has been concerned about them.  She also has 1 on her upper arm near her shoulder.  They are all less than a half a centimeter in size.  They have been scaly and of concern.  She is otherwise in good health and does not have any other areas of concern.  It has been recommended by dermatology to have them biopsied or excised.  The patient would like to have them excised.     Review of Systems  Constitutional: Negative.   HENT: Negative.   Eyes: Negative.   Respiratory: Negative.  Negative for chest tightness.   Cardiovascular: Negative.   Gastrointestinal: Negative.   Genitourinary: Negative.   Musculoskeletal: Negative.        Objective:   Physical Exam Vitals and nursing note reviewed.  Constitutional:      Appearance: Normal appearance.  HENT:     Head: Normocephalic and atraumatic.  Cardiovascular:     Rate and Rhythm: Normal rate.     Pulses: Normal pulses.  Musculoskeletal:       Arms:       Legs:  Neurological:     General: No focal deficit present.     Mental Status: She is alert and oriented to person, place, and time. Mental status is at baseline.  Psychiatric:        Mood and Affect: Mood normal.        Behavior: Behavior normal.         Assessment & Plan:     ICD-10-CM   1. Changing skin lesion  L98.9     Plan for excision of 2 changing skin lesions of the left leg and one changing skin lesion of the left arm.  I explained to her that there may be a need for an additional excision if the pathology comes back concerning or requiring additional  margins. Pictures were obtained of the patient and placed in the chart with the patient's or guardian's permission.

## 2020-01-25 ENCOUNTER — Encounter: Payer: Self-pay | Admitting: Cardiology

## 2020-01-26 ENCOUNTER — Encounter: Payer: Self-pay | Admitting: Cardiology

## 2020-02-01 ENCOUNTER — Ambulatory Visit (INDEPENDENT_AMBULATORY_CARE_PROVIDER_SITE_OTHER): Payer: Medicare HMO | Admitting: Plastic Surgery

## 2020-02-01 ENCOUNTER — Encounter: Payer: Self-pay | Admitting: Plastic Surgery

## 2020-02-01 ENCOUNTER — Other Ambulatory Visit (HOSPITAL_COMMUNITY)
Admission: RE | Admit: 2020-02-01 | Discharge: 2020-02-01 | Disposition: A | Discharge status: Home / Self Care | Payer: Medicare HMO | Source: Ambulatory Visit | Attending: Plastic Surgery | Admitting: Plastic Surgery

## 2020-02-01 ENCOUNTER — Other Ambulatory Visit: Payer: Self-pay

## 2020-02-01 DIAGNOSIS — L989 Disorder of the skin and subcutaneous tissue, unspecified: Secondary | ICD-10-CM

## 2020-02-01 DIAGNOSIS — C44719 Basal cell carcinoma of skin of left lower limb, including hip: Secondary | ICD-10-CM

## 2020-02-01 DIAGNOSIS — D2362 Other benign neoplasm of skin of left upper limb, including shoulder: Secondary | ICD-10-CM | POA: Diagnosis not present

## 2020-02-01 NOTE — Progress Notes (Signed)
Procedure Note  Preoperative Dx: changing skin lesions  1.  Left upper arm 2.  Left lower proximal leg 3.  Left lower distal leg  Postoperative Dx: Same  Procedure: Excision of changing skin lesion  1.  Left upper arm 8 mm 2.  Left proximal leg 7 mm 3.  Left distal leg 7 mm  Anesthesia: Lidocaine 1% with 1:100,000 epinepherine  Indication for Procedure: Changing skin lesion  Description of Procedure: Risks and complications were explained to the patient.  Consent was confirmed and the patient understands the risks and benefits.  The potential complications and alternatives were explained and the patient consents.  The patient expressed understanding the option of not having the procedure and the risks of a scar.  Time out was called and all information was confirmed to be correct.    Left upper arm: The area was prepped and drapped.  Lidocaine 1% with epinepherine was injected in the subcutaneous area.  After waiting several minutes for the local to take affect a #15 blade was used to excise the area in an eliptical pattern.  The skin edges were reapproximated with 5-0 and 6-0 Monocryl sutures.  A dressing was applied.   Left lower leg proximal: The area was prepped and drapped.  Lidocaine 1% with epinepherine was injected in the subcutaneous area.  After waiting several minutes for the local to take affect a #15 blade was used to excise the area in an eliptical pattern.  The skin edges were reapproximated with 5-0 Monocryl sutures.  A dressing was applied.  Left lower leg distal: The area was prepped and drapped.  Lidocaine 1% with epinepherine was injected in the subcutaneous area.  After waiting several minutes for the local to take affect a #15 blade was used to excise the area in an eliptical pattern.  The skin edges were reapproximated with 5-0 Monocryl sutures.  A dressing was applied.    The patient was given instructions on how to care for the area and a follow up appointment.   Alicia Wilkerson tolerated the procedure well and there were no complications. The specimens were sent to pathology.

## 2020-02-02 LAB — SURGICAL PATHOLOGY

## 2020-02-06 ENCOUNTER — Encounter: Payer: Self-pay | Admitting: Internal Medicine

## 2020-02-06 ENCOUNTER — Other Ambulatory Visit: Payer: Self-pay | Admitting: Internal Medicine

## 2020-02-06 ENCOUNTER — Other Ambulatory Visit: Payer: Self-pay

## 2020-02-06 DIAGNOSIS — F411 Generalized anxiety disorder: Secondary | ICD-10-CM

## 2020-02-06 NOTE — Telephone Encounter (Signed)
° °  Patient requesting response to MyChart message regarding refill for Lexapro

## 2020-02-07 MED ORDER — ESCITALOPRAM OXALATE 5 MG PO TABS: 5.0000 mg | ORAL_TABLET | Freq: Every day | ORAL | 1 refills | Status: DC

## 2020-02-09 ENCOUNTER — Ambulatory Visit: Payer: Medicare HMO | Admitting: Cardiology

## 2020-02-09 ENCOUNTER — Telehealth: Payer: Self-pay

## 2020-02-09 NOTE — Telephone Encounter (Signed)
02/07/20- call to pt per Dr. Marla Roe: Her path report showed that the lesion removed from her left arm was benign/dermatofibroma & no further action is needed.  left leg lesions - are as follows: Left proximal leg & left distal leg- are both  superficial basal cell carcinoma & may require further excision. Per Dr. Marla Roe- she would allow for the leg incisions to heal & this will allow for a more favorable re-excision/scar later. Dr. Marla Roe also indicated that on the pt's f/u 02/10/20- that we can remove the sutures from the arm - but [place], [place] will evaluate the leg incisions & remove those sutures if applicable. Dr. Marla Roe confirmed that the re-excision can be scheduled after the holidays.  Patient  will be out of town until the end of Dec. Pt states that she feels all the incisions are healing well & she denies any complications like chills/fever/drainage or other concerns. Pt understands & agrees with  the plan of care.

## 2020-02-10 ENCOUNTER — Encounter: Payer: Self-pay | Admitting: Surgical

## 2020-02-10 ENCOUNTER — Other Ambulatory Visit: Payer: Self-pay

## 2020-02-10 ENCOUNTER — Ambulatory Visit (INDEPENDENT_AMBULATORY_CARE_PROVIDER_SITE_OTHER): Payer: Medicare HMO | Admitting: Surgical

## 2020-02-10 VITALS — BP 140/70 | HR 63 | Temp 98.2°F

## 2020-02-10 DIAGNOSIS — C44711 Basal cell carcinoma of skin of unspecified lower limb, including hip: Secondary | ICD-10-CM

## 2020-02-10 DIAGNOSIS — L989 Disorder of the skin and subcutaneous tissue, unspecified: Secondary | ICD-10-CM

## 2020-02-10 DIAGNOSIS — C4491 Basal cell carcinoma of skin, unspecified: Secondary | ICD-10-CM

## 2020-02-10 NOTE — Progress Notes (Signed)
Patient is a 69 year old female here for follow-up after excision of changing skin lesions on her left upper arm, left lower proximal leg and left lower distal leg with Dr. Marla Roe on 02/01/2020.  Surgical pathology showed left arm biopsy was a dermatofibroma.  Left proximal leg biopsy was a superficial basal cell carcinoma.  Left distal leg biopsy was a superficial basal cell carcinoma.  It was previously discussed with Dr. Marla Roe per RN note that we would allow the left leg incisions to heal and undergo a reexcision at a later date.  Patient reports she he is doing well. On exam left arm incision is intact, left distal and proximal lower leg incisions are intact.  No dehiscence noted.  Some surrounding erythema noted on the lower leg incisions, however this appears irritated.  There is no cellulitic changes.  Steri-Strips in place.  No drainage noted.   We discussed the pathology results.  We discussed reexcision of the left lower leg basal cell carcinomas at a later date, sometime in January.  I discussed with the patient that I would further discuss this with Dr. Marla Roe and we can schedule her for that a reexcision.  She is planning to travel to [place] to see her daughter, I discussed with her covering the areas with a Band-Aid when exposed to the sun, wearing sunscreen to prevent discoloration of the scars.  We removed the sutures from the left arm incision.  We did not remove the sutures from the left proximal and left distal lower leg incisions.  I discussed with the patient that we would leave these in place to prevent incisional dehiscence.  Patient is in agreement with this plan.  I recommend she call with any questions or concerns.  We will see her in approximately 1 month for reexcision.

## 2020-03-02 ENCOUNTER — Telehealth: Payer: Self-pay

## 2020-03-02 NOTE — Telephone Encounter (Signed)
Patient called to see what we decided regarding when her stitches need to come out.  Patient mentioned that there was some discussion about that related to the removal of superficial basal cells and needing a clear margin.  Please call.

## 2020-03-05 ENCOUNTER — Telehealth: Payer: Self-pay

## 2020-03-05 NOTE — Telephone Encounter (Signed)
Call back from pt regarding her re-excision of left lower leg lesions . She notes that all incisions have healed properly and are intact. [place], [place] removed the sutures from her arm incision- but did not remove them from either leg incision on her f/u on 02/09/21. Our office has scheduled her on 03/12/20 @ 12:30pm for the re-excisions left leg (x2) with Dr. Marla Roe Pt understands the plan for that day- but she will be returning from out of town- & did ask if she could be on a "wait list" for any other day next week if possible. Our office will call if an alternative day is available.

## 2020-03-05 NOTE — Telephone Encounter (Signed)
Call to pt- no answer/left message requesting callback

## 2020-03-12 ENCOUNTER — Ambulatory Visit: Payer: Medicare HMO | Admitting: Plastic Surgery

## 2020-03-16 ENCOUNTER — Other Ambulatory Visit (HOSPITAL_COMMUNITY)
Admission: RE | Admit: 2020-03-16 | Discharge: 2020-03-16 | Disposition: A | Discharge status: Home / Self Care | Payer: Medicare HMO | Source: Ambulatory Visit | Attending: Plastic Surgery | Admitting: Plastic Surgery

## 2020-03-16 ENCOUNTER — Ambulatory Visit (INDEPENDENT_AMBULATORY_CARE_PROVIDER_SITE_OTHER): Payer: Medicare HMO | Admitting: Plastic Surgery

## 2020-03-16 ENCOUNTER — Other Ambulatory Visit: Payer: Self-pay

## 2020-03-16 ENCOUNTER — Encounter: Payer: Self-pay | Admitting: Plastic Surgery

## 2020-03-16 VITALS — BP 146/73 | HR 62

## 2020-03-16 DIAGNOSIS — L089 Local infection of the skin and subcutaneous tissue, unspecified: Secondary | ICD-10-CM | POA: Diagnosis not present

## 2020-03-16 DIAGNOSIS — L905 Scar conditions and fibrosis of skin: Secondary | ICD-10-CM | POA: Diagnosis not present

## 2020-03-16 DIAGNOSIS — C44719 Basal cell carcinoma of skin of left lower limb, including hip: Secondary | ICD-10-CM

## 2020-03-16 NOTE — Progress Notes (Signed)
Procedure Note  Preoperative Dx: skin cancer left leg  Postoperative Dx: Same  Procedure: Excision of skin cancer left leg 1. Proximal 5 x 15 mm 2. Distal 5 x 15 mm  Anesthesia: Lidocaine 1% with 1:100,000 epinepherine  Indication for Procedure: basal cell carcinoma  Description of Procedure: Risks and complications were explained to the patient.  Consent was confirmed and the patient understands the risks and benefits.  The potential complications and alternatives were explained and the patient consents.  The patient expressed understanding the option of not having the procedure and the risks of a scar.  Time out was called and all information was confirmed to be correct.     Proximal:  The area was prepped and drapped.  Lidocaine 1% with epinepherine was injected in the subcutaneous area.  After waiting several minutes for the local to take affect a #15 blade was used to excise the area in an eliptical pattern with at least 2 mm borders from the excision site.  The skin edges were reapproximated with 5-0 Monocryl.    Distal: The area was prepped and drapped.  Lidocaine 1% with epinepherine was injected in the subcutaneous area. After waiting several minutes for the local to take affect a #15 blade was used to excise the area in an eliptical pattern with at least 2 mm borders from the excision site.  The skin edges were reapproximated with 5-0 Monocryl.  A dressing was applied.  The patient was given instructions on how to care for the area and a follow up appointment.  Alicia Wilkerson tolerated the procedure well and there were no complications. The specimens were sent to pathology.

## 2020-03-20 LAB — SURGICAL PATHOLOGY

## 2020-03-29 NOTE — Progress Notes (Signed)
   Subjective:     Patient ID: ZOIE SARIN, female    DOB: Jun 26, 1950, 70 y.o.   MRN: [id number]  Chief Complaint  Patient presents with  . Follow-up    HPI: The patient is a 70 y.o. female here for follow-up after undergoing excision of basal cell carcinomas of the left lower leg (proximal: 5 x 15 mm; Distal: 5 x 15 mm) on 03/16/20 with Dr. Marla Roe.  Pathology results (reviewed with patient): Both proximal and distal lesions - skin with underlying fibroosis, degenerative/reactive changes and chronic inflammation. No evidence of malignancy.  ~ 2 weeks PO Patient reports she is doing well. No complaints. Denies fever, chills, nausea, vomiting, and pain.   Review of Systems  Constitutional: Negative for chills and fever.  Respiratory: Negative for shortness of breath.   Cardiovascular: Negative for chest pain and leg swelling.  Gastrointestinal: Negative for constipation, diarrhea, nausea and vomiting.  Skin: Negative for color change, pallor, rash and wound (incisions healing well).     Objective:   Vital Signs BP 137/73 (BP Location: Left Arm, Patient Position: Sitting, Cuff Size: Normal)   Pulse 65   LMP 02/24/2002   SpO2 97%  Vital Signs and Nursing Note Reviewed  Physical Exam HENT:     Head: Normocephalic and atraumatic.  Eyes:     Extraocular Movements: Extraocular movements intact and EOM normal.  Cardiovascular:     Rate and Rhythm: Normal rate.  Pulmonary:     Effort: Pulmonary effort is normal.  Musculoskeletal:        General: Normal range of motion.     Cervical back: Normal range of motion.     Comments: Both incisions are healing very nicely, C/D/I.  No signs of infection or drainage.  Steri-Strips were removed today.  Sutures were removed today.  Skin:    General: Skin is warm and dry.     Coloration: Skin is not pale.     Findings: No erythema or rash.  Neurological:     Mental Status: She is alert and oriented to person, place, and time.      Gait: Gait is intact.  Psychiatric:        Mood and Affect: Mood and affect normal.        Behavior: Behavior normal.        Thought Content: Thought content normal.        Cognition and Memory: Memory normal.        Judgment: Judgment normal.    Pictures were obtained of the patient and placed in the chart with the patient's or guardian's permission.      Assessment/Plan:     ICD-10-CM   1. Basal cell carcinoma (BCC) of skin of left lower extremity including hip  C44.719     Both incisions are healing very nicely, C/D/I.  No signs of infection or drainage.  Pathology results show no signs of malignancy.  Follow-up as needed.  May apply Vaseline or unscented lotion for moisture.  Return precautions provided.  Call office with any questions/concerns.   Threasa Heads, PA-C 03/30/2020, 9:45 AM

## 2020-03-30 ENCOUNTER — Encounter: Payer: Self-pay | Admitting: Plastic Surgery

## 2020-03-30 ENCOUNTER — Other Ambulatory Visit: Payer: Self-pay

## 2020-03-30 ENCOUNTER — Ambulatory Visit (INDEPENDENT_AMBULATORY_CARE_PROVIDER_SITE_OTHER): Payer: Medicare HMO | Admitting: Plastic Surgery

## 2020-03-30 VITALS — BP 137/73 | HR 65

## 2020-03-30 DIAGNOSIS — C44719 Basal cell carcinoma of skin of left lower limb, including hip: Secondary | ICD-10-CM

## 2020-04-03 ENCOUNTER — Telehealth: Payer: Medicare HMO

## 2020-04-12 ENCOUNTER — Encounter: Payer: Self-pay | Admitting: Cardiology

## 2020-04-12 ENCOUNTER — Ambulatory Visit: Payer: Medicare HMO | Admitting: Cardiology

## 2020-04-12 ENCOUNTER — Other Ambulatory Visit: Payer: Self-pay

## 2020-04-12 VITALS — BP 142/78 | HR 62 | Ht 63.0 in | Wt 138.0 lb

## 2020-04-12 DIAGNOSIS — R9389 Abnormal findings on diagnostic imaging of other specified body structures: Secondary | ICD-10-CM | POA: Diagnosis not present

## 2020-04-12 DIAGNOSIS — I2510 Atherosclerotic heart disease of native coronary artery without angina pectoris: Secondary | ICD-10-CM | POA: Diagnosis not present

## 2020-04-12 DIAGNOSIS — I10 Essential (primary) hypertension: Secondary | ICD-10-CM | POA: Diagnosis not present

## 2020-04-12 DIAGNOSIS — E782 Mixed hyperlipidemia: Secondary | ICD-10-CM

## 2020-04-12 NOTE — Patient Instructions (Signed)
Medication Instructions:  NO CHANGES *If you need a refill on your cardiac medications before your next appointment, please call your pharmacy*   Lab Work: NONE If you have labs (blood work) drawn today and your tests are completely normal, you will receive your results only by: Marland Kitchen MyChart Message (if you have MyChart) OR . A paper copy in the mail If you have any lab test that is abnormal or we need to change your treatment, we will call you to review the results.   Testing/Procedures: NONE   Follow-Up: At [facility], you and your health needs are our priority.  As part of our continuing mission to provide you with exceptional heart care, we have created designated Provider Care Teams.  These Care Teams include your primary Cardiologist (physician) and Advanced Practice Providers (APPs -  Physician Assistants and Nurse Practitioners) who all work together to provide you with the care you need, when you need it.  We recommend signing up for the patient portal called "MyChart".  Sign up information is provided on this After Visit Summary.  MyChart is used to connect with patients for Virtual Visits (Telemedicine).  Patients are able to view lab/test results, encounter notes, upcoming appointments, etc.  Non-urgent messages can be sent to your provider as well.   To learn more about what you can do with MyChart, go to [email or web address].    Your next appointment:   6 month(s)  The format for your next appointment:   In Person  Provider:   Gwyndolyn Kaufman, MD   Other Instructions

## 2020-04-12 NOTE — Progress Notes (Signed)
Cardiology Office Note    Date:  04/12/2020   ID:  Alicia Wilkerson, DOB 1950-08-26, MRN [id number]  PCP:  Binnie Rail, MD  Cardiologist:  Ena Dawley, MD  Electrophysiologist:  None   Reason for visit: Follow-up for CAD  History of Present Illness:   Alicia Wilkerson is a 70 y.o. female with history of longstanding anxiety, anemia, FH of early CAD (in her father, sister, uncle, aunt), secondary hyperparathyroidism (due to excess vitamin D), osteoporosis/osteopenia, spherocytosis s/p splenectomy, borderline HTN, mild hyperlipidemia, CAD by coronary CTA 12/2018, possible MAI by CT 12/2018 who presents to follow up cardiac testing.   Seen in the office 10/2018 for dyspnea on exertion. At the visit her blood pressure was significantly elevated. Per Dr. Francesca Oman note she became significantly anxious at the suggestion of blood pressure medication. 2D echo showed EF 60-65%, no LVH, + elevated LVEDP, impaired relaxation, normal RV, normal PASP, mild TR. Coronary CTA showed calcium score of 544, 96 percentile for age and sex matched control. There was diffuse moderate to severe calcified plaque in the proximal to mid LAD with possibly severe stenosis in the mid LAD. FFR analysis of the LAD results were Proximal: 0.94, mid: 0.92, distal: 0.82, arguing against any hemodynamically significant stenosis. Chest overread indicated the appearance of the lungs suggested chronic indolent atypical infectious process such as mycobacterium avium intracellulare (MAI) and aortic atherosclerosis, so outpatient pulmonary referral was recommended. The patient wished to defer as she has felt too overwhelmed between the election, Covid and seeing various specialties. Based on study, it was recommended to start ASA 81mg  and Crestor 20mg  daily.  Last labs 11/2018 showed BNP 103, Hgb 14.2, K 4.2, Cr 0.75, glucose 104, 09/2018 TSH wnl, LDL 113, LFTs wnl.  09/08/2019 -the patient is coming for regular follow-up, she remains  very active she has lost 10 pounds, she tries to eat healthy, she starts eating at 4 PM and then has dinner, she does not eat during the day.  She denies any chest pain or shortness of breath.  She brings blood pressure diary and most of her blood pressures are in the 130s to 140s to 150s range and diastolic blood pressure mostly in 80s.  She denies any lower extremity edema orthopnea proximal nocturnal dyspnea.  He is tolerating rosuvastatin well.  She is not taking any blood pressure medications.  04/12/2020 -this is 6 months follow-up, the patient is doing great, she walks several miles every day and has no symptoms of chest pain or shortness of breath.  She denies any palpitations dizziness or syncope and no lower extremity edema.  She is eating very healthy.  She has been compliant with her medications and has no side effects.  Past Medical History:  Diagnosis Date  . Anemia   . Anxiety disorder   . Basal cell carcinoma (BCC) of left upper arm 12/2015  . Basal cell carcinoma (BCC) of right lower leg 04/2016  . Borderline hypertension   . CAD (coronary artery disease)    a. nonobst by cor CT 12/2018.  Marland Kitchen CIN II (cervical intraepithelial neoplasia II)    CIN  1  . Depression   . Eczema   . Family history of premature CAD   . History of basal cell carcinoma (BCC) excision    left upper arm 2017;   right lower leg 2018;  bilateral lower leg and hip area 2019;   inner right lower leg, outer right thigh, & upper outside left arm  09/ 2020  . History of cervical dysplasia 2013  . History of hereditary spherocytosis    s/p splenectomy in 1954  . History of hyperparathyroidism    per pt yrs ago was put on mega dose of vit d which caused the hyperparathyroid resolved after stopping vit d  . History of pelvic fracture 2013  . MAI (mycobacterium avium-intracellulare) ([place])    ? possibly by CT 12/2018  . Mild hyperlipidemia   . Osteoporosis   . PONV (postoperative nausea and vomiting)   . S/P  splenectomy    spherocytosis  . Wears glasses   . White coat syndrome without diagnosis of hypertension     Past Surgical History:  Procedure Laterality Date  . ANKLE SURGERY  03/14/2015   . CERVICAL CONIZATION W/BX N/A 12/13/2018   Procedure: CONIZATION CERVIX WITH BIOPSY;  Surgeon: Megan Salon, MD;  Location: [place];  Service: Gynecology;  Laterality: N/A;  . CYSTOSCOPY N/A 10/18/2019   Procedure: CYSTOSCOPY;  Surgeon: Megan Salon, MD;  Location: [facility];  Service: Gynecology;  Laterality: N/A;  . IR RADIOLOGY PERIPHERAL GUIDED IV START  01/05/2019  . IR US GUIDE VASC ACCESS RIGHT  01/05/2019  . KNEE SURGERY Left 1991   per pt retained hardward  . LEEP N/A 12/13/2018   Procedure: possible LOOP ELECTROSURGICAL EXCISION PROCEDURE (LEEP);  Surgeon: Megan Salon, MD;  Location: [place];  Service: Gynecology;  Laterality: N/A;  . ORIF ANKLE FRACTURE Left 03/14/2015   per pt retained hardware  . SPLENECTOMY, TOTAL  1954   due to spherocytosis  . TOTAL LAPAROSCOPIC HYSTERECTOMY WITH SALPINGECTOMY N/A 10/18/2019   Procedure: TOTAL LAPAROSCOPIC HYSTERECTOMY WITH BILATERAL SALPINGECTOMY AND BILATERAL OOPHORECTOMY;  Surgeon: Megan Salon, MD;  Location: [facility];  Service: Gynecology;  Laterality: N/A;  BILATERAL SALPINGECTOMY POSS OOPHORECTOMY  . TUBAL LIGATION Bilateral 1995    Current Medications: Current Meds  Medication Sig  . ASPIRIN 81 PO Take 81 mg by mouth daily.  . clonazePAM (KLONOPIN) 0.5 MG tablet Take 1 tablet (0.5 mg total) by mouth daily as needed for anxiety.  Marland Kitchen escitalopram (LEXAPRO) 5 MG tablet Take 1 tablet (5 mg total) by mouth daily.  . halobetasol (ULTRAVATE) 0.05 % cream Apply 1 application topically daily as needed (irritation).   Marland Kitchen losartan (COZAAR) 25 MG tablet Take 1 tablet (25 mg total) by mouth daily.  . Omega-3 Fatty Acids (FISH OIL PO) Take by mouth.  . rosuvastatin (CRESTOR) 20 MG tablet TAKE 1 TABLET BY MOUTH EVERY  DAY     Allergies:   Penicillins and Sulfa antibiotics   Social History   Socioeconomic History  . Marital status: Widowed    Spouse name: Not on file  . Number of children: Not on file  . Years of education: Not on file  . Highest education level: Not on file  Occupational History  . Not on file  Tobacco Use  . Smoking status: Never Smoker  . Smokeless tobacco: Never Used  Vaping Use  . Vaping Use: Never used  Substance and Sexual Activity  . Alcohol use: Not Currently  . Drug use: No  . Sexual activity: Not Currently    Partners: Male    Birth control/protection: Surgical, Post-menopausal    Comment: BTL & hysterectomy  Other Topics Concern  . Not on file  Social History Narrative  . Not on file   Social Determinants of Health   Financial Resource Strain: Low Risk   .  Difficulty of Paying Living Expenses: Not hard at all  Food Insecurity: No Food Insecurity  . Worried About Charity fundraiser in the Last Year: Never true  . Ran Out of Food in the Last Year: Never true  Transportation Needs: No Transportation Needs  . Lack of Transportation (Medical): No  . Lack of Transportation (Non-Medical): No  Physical Activity: Sufficiently Active  . Days of Exercise per Week: 5 days  . Minutes of Exercise per Session: 30 min  Stress: Not on file  Social Connections: Unknown  . Frequency of Communication with Friends and Family: More than three times a week  . Frequency of Social Gatherings with Friends and Family: Never  . Attends Religious Services: Never  . Active Member of Clubs or Organizations: No  . Attends Archivist Meetings: Never  . Marital Status: Patient refused     Family History:  The patient's family history includes Colon cancer in her mother; Crohn's disease in her daughter; Heart disease in her father and sister; Heart failure in her father; Hypertension in her father; Osteoporosis in her mother; Skin cancer in her father.  ROS:   Please  see the history of present illness.   All other systems are reviewed and otherwise negative.    EKGs/Labs/Other Studies Reviewed:    Studies reviewed were summarized above.   Coronary CTA 01/05/19 ADDENDUM REPORT: 01/06/2019 20:42  EXAM: CT FFR ANALYSIS  CLINICAL DATA:  70 year old female with h/o HTN, HLP and dyspnea on exertion.  FINDINGS: FFRct analysis was performed on the original cardiac CT angiogram dataset. Diagrammatic representation of the FFRct analysis is provided in a separate PDF document in PACS. This dictation was created using the PDF document and an interactive 3D model of the results. 3D model is not available in the EMR/PACS. Normal FFR range is >0.80.  1. Left Main:  No significant stenosis.  2. LAD: Proximal: 0.94, mid: 0.92, distal: 0.82. 3. LCX: No significant stenosis. 4. RCA: No significant stenosis.  IMPRESSION: 1. CT FFR analysis didn't show any significant stenosis. Aggressive medical management is recommended.   Electronically Signed   By: Ena Dawley   On: 01/06/2019 20:42   Addended by Dorothy Spark, MD on 01/06/2019 8:45 PM    ADDENDUM REPORT: 01/06/2019 20:35  CLINICAL DATA:  70 year old female with h/o HTN, HLP and dyspnea on exertion.  EXAM: Cardiac/Coronary  CTA  TECHNIQUE: The patient was scanned on a [employer].  FINDINGS: A 100 kV prospective scan was triggered in the descending thoracic aorta at 111 HU's. Axial non-contrast 3 mm slices were carried out through the heart. The data set was analyzed on a dedicated work station and scored using the [place]. Gantry rotation speed was 250 msecs and collimation was .6 mm. No beta blockade and 0.8 mg of sl NTG was given. The 3D data set was reconstructed in 5% intervals of the 67-82 % of the R-R cycle. Diastolic phases were analyzed on a dedicated work station using MPR, MIP and VRT modes. The patient received 80 cc of  contrast.  Aorta: Normal size. Mild diffuse atherosclerotic plaque and calcifications. No dissection.  Aortic Valve:  Trileaflet.  Mild calcifications.  Coronary Arteries:  Normal coronary origin.  Right dominance.  RCA is a large dominant artery that gives rise to PDA and PLA. There is minimal calcified plaque in the ostium with stenosis 0-25%.  Left main is a large artery that gives rise to LAD and LCX arteries. Left  main has minimal ostial plaque.  LAD is a large vessel that gives rise to one diagonal artery. LAD has diffuse moderate to severe calcified plaque in the proximal to mid portion, there are two moderate plaques with stenosis 50-69% followed by a focal stenosis that is possibly severe. Mid to distal LAD has minimal plaque.  D1 has minimal plaque.  LCX is a non-dominant artery that gives rise to one large OM1 branch. There is minimal plaque.  Other findings:  Normal pulmonary vein drainage into the left atrium.  Normal left atrial appendage without a thrombus.  Normal size of the pulmonary artery.  IMPRESSION: 1. Coronary calcium score of 544. This was 37 percentile for age and sex matched control.  2. Normal coronary origin with right dominance.  3. Diffuse moderate to severe calcified plaque in the proximal to mid LAD with possibly severe stenosis in the mid LAD. Additional analysis with CT FFR will be submitted.   Electronically Signed   By: Ena Dawley   On: 01/06/2019 20:35   Addended by Dorothy Spark, MD on 01/06/2019 8:37 PM    Study Result  EXAM: OVER-READ INTERPRETATION  CT CHEST  The following report is an over-read performed by radiologist Dr. Vinnie Langton of [facility], [place] on 01/05/2019. This over-read does not include interpretation of cardiac or coronary anatomy or pathology. The coronary calcium score/coronary CTA interpretation by the cardiologist is attached.  COMPARISON:   None.  FINDINGS: Aortic atherosclerosis. A few scattered calcified granulomas are noted. Within the visualized portions of the thorax there are no suspicious appearing pulmonary nodules or masses, there is no acute consolidative airspace disease, no pleural effusions, no pneumothorax and no lymphadenopathy. There are widespread areas of cylindrical and mild varicose bronchiectasis with associated thickening of the peribronchovascular interstitium, regional areas of architectural distortion and peribronchovascular micro nodularity most compatible with areas of chronic mucoid impaction within terminal bronchioles. Visualized portions of the upper abdomen are unremarkable. There are no aggressive appearing lytic or blastic lesions noted in the visualized portions of the skeleton.  IMPRESSION: 1. The appearance of the lungs suggests chronic indolent atypical infectious process such as mycobacterium avium intracellulare (MAI). Outpatient referral to Pulmonology for further evaluation is suggested. 2. Aortic atherosclerosis.  Electronically Signed: By: Vinnie Langton M.D. On: 01/05/2019 13:27     2D echo 11/24/18 IMPRESSIONS  1. Left ventricular ejection fraction, by visual estimation, is 60 to 65%. The left ventricle has normal function. Normal left ventricular size. There is no left ventricular hypertrophy.  2. Elevated left ventricular end-diastolic pressure.  3. Left ventricular diastolic Doppler parameters are consistent with impaired relaxation pattern of LV diastolic filling.  4. Global right ventricle has normal systolic function.The right ventricular size is normal. No increase in right ventricular wall thickness.  5. Left atrial size was normal.  6. Right atrial size was normal.  7. The mitral valve is normal in structure. No evidence of mitral valve regurgitation. No evidence of mitral stenosis.  8. The tricuspid valve is normal in structure. Tricuspid valve  regurgitation is mild.  9. The aortic valve was not well visualized Aortic valve regurgitation was not visualized by color flow Doppler. Structurally normal aortic valve, with no evidence of sclerosis or stenosis. 10. The pulmonic valve was normal in structure. Pulmonic valve regurgitation is not visualized by color flow Doppler. 11. Normal pulmonary artery systolic pressure. 12. The inferior vena cava is normal in size with greater than 50% respiratory variability, suggesting right atrial pressure  of 3 mmHg.      EKG:  EKG was ordered today.  It shows sinus bradycardia 58 bpm, first-degree AV block, LVH with repolarization abnormalities however unchanged from prior.  Recent Labs: 11/04/2019: ALT 23; BUN 15; Creat 0.71; Hemoglobin 13.3; Platelets 314; Potassium 4.4; Sodium 140; TSH 2.05  Recent Lipid Panel    Component Value Date/Time   CHOL 149 11/04/2019 0955   CHOL 135 03/25/2019 0829   TRIG 57 11/04/2019 0955   HDL 69 11/04/2019 0955   HDL 75 03/25/2019 0829   CHOLHDL 2.2 11/04/2019 0955   VLDL 13.2 10/20/2018 1456   LDLCALC 67 11/04/2019 0955    PHYSICAL EXAM:    VS:  BP (!) 142/78   Pulse 62   Ht 5\' 3"  (1.6 m)   Wt 138 lb (62.6 kg)   LMP 02/24/2002   SpO2 98%   BMI 24.45 kg/m   BMI: Body mass index is 24.45 kg/m.  GEN: Well nourished, well developed F in no acute distress HEENT: normocephalic, atraumatic Neck: no JVD, carotid bruits, or masses Cardiac: RRR; no murmurs, rubs, or gallops, no edema  Respiratory:  clear to auscultation bilaterally, normal work of breathing GI: soft, nontender, nondistended, + BS MS: no deformity or atrophy Skin: warm and dry, no rash Neuro:  Alert and Oriented x 3, Strength and sensation are intact, follows commands Psych: euthymic mood, full affect  Wt Readings from Last 3 Encounters:  04/12/20 138 lb (62.6 kg)  11/29/19 134 lb (60.8 kg)  11/04/19 135 lb (61.2 kg)     ASSESSMENT & PLAN:   1. CAD -moderate nonobstructive, she  is asymptomatic, will continue aspirin and rosuvastatin.  Her EKG today is unchanged from prior. 2. Hypertension with significant LVH -controlled with addition of losartan 25 mg daily. 3. Hyperlipidemia -she is tolerating rosuvastatin well.  Her lipids are at goal.  She is also exercising daily and eating healthy.  She is congratulated on her efforts. 4. Abnormal chest CT - discussed findings of possible MAI with patient. She has read up about this herself as well. At this time she wishes to defer pulmonology evaluation due to feeling overwhelmed. 5. Anxiety, she was started on Lexapro by her primary care physician and she feels significantly better.  Disposition: F/u with Dr. Johney Frame in 6 months.  Medication Adjustments/Labs and Tests Ordered: Current medicines are reviewed at length with the patient today.  Concerns regarding medicines are outlined above. Medication changes, Labs and Tests ordered today are summarized above and listed in the Patient Instructions accessible in Encounters.   Signed, Ena Dawley, MD  04/12/2020 11:01 AM    [facility] [place], [place], [place]  [address] Phone: [phone]; Fax: [phone]

## 2020-06-01 ENCOUNTER — Other Ambulatory Visit: Payer: Self-pay | Admitting: Cardiology

## 2020-06-01 DIAGNOSIS — I2510 Atherosclerotic heart disease of native coronary artery without angina pectoris: Secondary | ICD-10-CM

## 2020-06-01 DIAGNOSIS — R030 Elevated blood-pressure reading, without diagnosis of hypertension: Secondary | ICD-10-CM

## 2020-06-01 DIAGNOSIS — E785 Hyperlipidemia, unspecified: Secondary | ICD-10-CM

## 2020-06-04 ENCOUNTER — Telehealth: Payer: Self-pay | Admitting: Pharmacist

## 2020-06-07 NOTE — Progress Notes (Signed)
Made several attempts to speak with patient about hypertension adherence call. Unable to reach patient, left messages to return call  [place] Pharmacist Assistant [phone]  Time spent:10

## 2020-06-11 ENCOUNTER — Encounter: Payer: Self-pay | Admitting: Plastic Surgery

## 2020-06-11 DIAGNOSIS — D1801 Hemangioma of skin and subcutaneous tissue: Secondary | ICD-10-CM | POA: Diagnosis not present

## 2020-06-11 DIAGNOSIS — L738 Other specified follicular disorders: Secondary | ICD-10-CM | POA: Diagnosis not present

## 2020-06-11 DIAGNOSIS — D2271 Melanocytic nevi of right lower limb, including hip: Secondary | ICD-10-CM | POA: Diagnosis not present

## 2020-06-11 DIAGNOSIS — L814 Other melanin hyperpigmentation: Secondary | ICD-10-CM | POA: Diagnosis not present

## 2020-06-11 DIAGNOSIS — L821 Other seborrheic keratosis: Secondary | ICD-10-CM | POA: Diagnosis not present

## 2020-06-11 DIAGNOSIS — Z85828 Personal history of other malignant neoplasm of skin: Secondary | ICD-10-CM | POA: Diagnosis not present

## 2020-06-11 DIAGNOSIS — L570 Actinic keratosis: Secondary | ICD-10-CM | POA: Diagnosis not present

## 2020-06-12 NOTE — Progress Notes (Signed)
Chronic Care Management Pharmacy Assistant   Name: Alicia Wilkerson  MRN: [id number] DOB: 1951/02/03   Reason for Encounter: Hypertension Disease State Call   Conditions to be addressed/monitored: HTN  Recent office visits:  None ID  Recent consult visits:  01/17/20 [facility] Plastic Surg, skin problem 01/25/20 Rochel Brome PA, Plastic Surg 03/16/20 [facility], procedure excision skin cancer on left led  04/12/20 Dr. Ena Dawley Cardiology  [facility] visits:  None in previous 6 months  Medications: Outpatient Encounter Medications as of 06/04/2020  Medication Sig  . ASPIRIN 81 PO Take 81 mg by mouth daily.  . clonazePAM (KLONOPIN) 0.5 MG tablet Take 1 tablet (0.5 mg total) by mouth daily as needed for anxiety.  Marland Kitchen escitalopram (LEXAPRO) 5 MG tablet Take 1 tablet (5 mg total) by mouth daily.  . halobetasol (ULTRAVATE) 0.05 % cream Apply 1 application topically daily as needed (irritation).   Marland Kitchen losartan (COZAAR) 25 MG tablet TAKE 1 TABLET BY MOUTH EVERY DAY  . Omega-3 Fatty Acids (FISH OIL PO) Take by mouth.  . rosuvastatin (CRESTOR) 20 MG tablet TAKE 1 TABLET BY MOUTH EVERY DAY   No facility-administered encounter medications on file as of 06/04/2020.   Pharmacist Review  Reviewed chart prior to disease state call. Spoke with patient regarding BP  Recent Office Vitals: BP Readings from Last 3 Encounters:  04/12/20 (!) 142/78  03/30/20 137/73  03/16/20 (!) 146/73   Pulse Readings from Last 3 Encounters:  04/12/20 62  03/30/20 65  03/16/20 62    Wt Readings from Last 3 Encounters:  04/12/20 138 lb (62.6 kg)  11/29/19 134 lb (60.8 kg)  11/04/19 135 lb (61.2 kg)     Kidney Function Lab Results  Component Value Date/Time   CREATININE 0.71 11/04/2019 09:55 AM   CREATININE 0.85 10/04/2019 02:36 PM   CREATININE 0.75 12/03/2018 06:56 AM   CREATININE 0.76 04/28/2014 11:24 AM   GFR 83.59 10/20/2018 02:56 PM   GFRNONAA >60 10/04/2019 02:36 PM    GFRAA >60 10/04/2019 02:36 PM    BMP Latest Ref Rng & Units 11/04/2019 10/04/2019 12/03/2018  Glucose 65 - 99 mg/dL 94 98 104(H)  BUN 7 - 25 mg/dL 15 12 19   Creatinine 0.50 - 0.99 mg/dL 0.71 0.85 0.75  BUN/Creat Ratio 6 - 22 (calc) NOT APPLICABLE - -  Sodium 262 - 146 mmol/L 140 142 139  Potassium 3.5 - 5.3 mmol/L 4.4 4.9 4.2  Chloride 98 - 110 mmol/L 105 105 102  CO2 20 - 32 mmol/L 25 28 24   Calcium 8.6 - 10.4 mg/dL 9.7 9.8 9.7    . Current antihypertensive regimen: Patient states she is taking Losartan 25 mg daily   . How often are you checking your Blood Pressure? Patient states that her blood pressure is under control with losartan, does not check blood pressure anymore at home  . Current home BP readings: Patient last blood pressure reading at Dr. Francesca Oman office was 142/78  . What recent interventions/DTPs have been made by any provider to improve Blood Pressure control since last CPP Visit: Patient is to continue with current medication for blood pressure  . Any recent hospitalizations or ED visits since last visit with CPP? Patient has not had any hospital or ED visits  . What diet changes have been made to improve Blood Pressure Control?  Patient states that she has not changed her diet  . What exercise is being done to improve your Blood Pressure Control?  Patient  states that she walks and does yoga  Adherence Review: Is the patient currently on ACE/ARB medication? Yes, Losartan Does the patient have >5 day gap between last estimated fill dates? No  Star Rating Drugs: Losartan 06/04/20 90 ds Rosuvastatin 04/07/20 90 ds   Ethelene Hal Clinical Pharmacist Assistant [phone]  Time spent:30

## 2020-06-22 ENCOUNTER — Other Ambulatory Visit: Payer: Self-pay | Admitting: Obstetrics & Gynecology

## 2020-06-22 DIAGNOSIS — Z1231 Encounter for screening mammogram for malignant neoplasm of breast: Secondary | ICD-10-CM

## 2020-06-22 NOTE — Progress Notes (Signed)
    Chronic Care Management Pharmacy Assistant   Name: AAROHI REDDITT  MRN: [id number] DOB: [birth year]   Reason for Encounter: Chart Review    Medications: Outpatient Encounter Medications as of 06/04/2020  Medication Sig  . ASPIRIN 81 PO Take 81 mg by mouth daily.  . clonazePAM (KLONOPIN) 0.5 MG tablet Take 1 tablet (0.5 mg total) by mouth daily as needed for anxiety.  Marland Kitchen escitalopram (LEXAPRO) 5 MG tablet Take 1 tablet (5 mg total) by mouth daily.  . halobetasol (ULTRAVATE) 0.05 % cream Apply 1 application topically daily as needed (irritation).   Marland Kitchen losartan (COZAAR) 25 MG tablet TAKE 1 TABLET BY MOUTH EVERY DAY  . Omega-3 Fatty Acids (FISH OIL PO) Take by mouth.  . rosuvastatin (CRESTOR) 20 MG tablet TAKE 1 TABLET BY MOUTH EVERY DAY   No facility-administered encounter medications on file as of 06/04/2020.    Reviewed chart for medication changes and adherence.   No gaps in adherence identified. Patient has follow up scheduled with pharmacy team. No further action required.  [place] Pharmacist Assistant [phone]  Time spent:9

## 2020-06-26 ENCOUNTER — Encounter (HOSPITAL_BASED_OUTPATIENT_CLINIC_OR_DEPARTMENT_OTHER): Payer: Self-pay

## 2020-07-13 DIAGNOSIS — H524 Presbyopia: Secondary | ICD-10-CM | POA: Diagnosis not present

## 2020-07-31 ENCOUNTER — Telehealth: Payer: Self-pay

## 2020-07-31 NOTE — Progress Notes (Signed)
    Chronic Care Management Pharmacy Assistant   Name: Alicia Wilkerson  MRN: [id number] DOB: Dec 28, 1950  Reason for Encounter: Disease State - Hypertension  Recent office visits:  None noted  Recent consult visits:  04/12/20 [facility] (Cardiology) - Coronary artery disease. EKG done. F/u 6 mos.   03/30/20 [facility] (Plastic Surgery) - F/u Basal cell carcinoma.  01/17/20 [facility] (Plastic Surgery) Changing skin lesion.   Hospital visits:  Medication Reconciliation was completed by comparing discharge summary, patient's EMR and Pharmacy list, and upon discussion with patient.  Admitted to the hospital on 03/16/2020 due to Excision of skin cancer left leg . Discharge date was 03/16/20 . Discharged from [facility].   Medications that remain the same after Hospital Discharge:??  -All other medications will remain the same.    Medications: Outpatient Encounter Medications as of 07/31/2020  Medication Sig   ASPIRIN 81 PO Take 81 mg by mouth daily.   clonazePAM (KLONOPIN) 0.5 MG tablet Take 1 tablet (0.5 mg total) by mouth daily as needed for anxiety.   escitalopram (LEXAPRO) 5 MG tablet Take 1 tablet (5 mg total) by mouth daily.   halobetasol (ULTRAVATE) 0.05 % cream Apply 1 application topically daily as needed (irritation).    losartan (COZAAR) 25 MG tablet TAKE 1 TABLET BY MOUTH EVERY DAY   Omega-3 Fatty Acids (FISH OIL PO) Take by mouth.   rosuvastatin (CRESTOR) 20 MG tablet TAKE 1 TABLET BY MOUTH EVERY DAY   No facility-administered encounter medications on file as of 07/31/2020.    Reviewed chart prior to disease state call. Spoke with patient regarding BP  Recent Office Vitals: BP Readings from Last 3 Encounters:  04/12/20 (!) 142/78  03/30/20 137/73  03/16/20 (!) 146/73   Pulse Readings from Last 3 Encounters:  04/12/20 62  03/30/20 65  03/16/20 62    Wt Readings from Last 3 Encounters:  04/12/20 138 lb (62.6 kg)  11/29/19 134 lb (60.8 kg)  11/04/19 135 lb (61.2 kg)      Kidney Function Lab Results  Component Value Date/Time   CREATININE 0.71 11/04/2019 09:55 AM   CREATININE 0.85 10/04/2019 02:36 PM   CREATININE 0.75 12/03/2018 06:56 AM   CREATININE 0.76 04/28/2014 11:24 AM   GFR 83.59 10/20/2018 02:56 PM   GFRNONAA >60 10/04/2019 02:36 PM   GFRAA >60 10/04/2019 02:36 PM    BMP Latest Ref Rng & Units 11/04/2019 10/04/2019 12/03/2018  Glucose 65 - 99 mg/dL 94 98 104(H)  BUN 7 - 25 mg/dL 15 12 19   Creatinine 0.50 - 0.99 mg/dL 0.71 0.85 0.75  BUN/Creat Ratio 6 - 22 (calc) NOT APPLICABLE - -  Sodium 466 - 146 mmol/L 140 142 139  Potassium 3.5 - 5.3 mmol/L 4.4 4.9 4.2  Chloride 98 - 110 mmol/L 105 105 102  CO2 20 - 32 mmol/L 25 28 24   Calcium 8.6 - 10.4 mg/dL 9.7 9.8 9.7     Reviewed chart for medication changes and drug therapy problems ahead of medication adherence call.  Attempted to contact patient x 3 for medication review and health check, unable to reach patient, left voicemails to return call.   Star Rating Drugs: Losartan - last fill 06/04/20 90D Rosuvastatin - last fill 07/12/20 [place], RMA Clinical Pharmacists Assistant [phone]  Time Spent: 18

## 2020-08-10 ENCOUNTER — Other Ambulatory Visit: Payer: Self-pay

## 2020-08-10 ENCOUNTER — Telehealth: Payer: Self-pay | Admitting: Internal Medicine

## 2020-08-10 MED ORDER — ESCITALOPRAM OXALATE 5 MG PO TABS: 5.0000 mg | ORAL_TABLET | Freq: Every day | ORAL | 1 refills | Status: DC

## 2020-08-10 NOTE — Telephone Encounter (Signed)
Faxed in today. 

## 2020-08-10 NOTE — Telephone Encounter (Signed)
1.Medication Requested: escitalopram (LEXAPRO) 5 MG tablet   2. Pharmacy ([place]): [facility] [address], [place]   3. On Med List: yes   4. Last Visit with PCP: 11-04-19  5. Next visit date with PCP: 11-13-20   Agent: Please be advised that RX refills may take up to 3 business days. We ask that you follow-up with your pharmacy.

## 2020-08-14 ENCOUNTER — Ambulatory Visit
Admission: RE | Admit: 2020-08-14 | Discharge: 2020-08-14 | Disposition: A | Discharge status: Home / Self Care | Payer: Medicare HMO | Source: Ambulatory Visit | Attending: Obstetrics & Gynecology | Admitting: Obstetrics & Gynecology

## 2020-08-14 ENCOUNTER — Ambulatory Visit (INDEPENDENT_AMBULATORY_CARE_PROVIDER_SITE_OTHER): Payer: Medicare HMO | Admitting: Internal Medicine

## 2020-08-14 ENCOUNTER — Other Ambulatory Visit: Payer: Self-pay

## 2020-08-14 ENCOUNTER — Encounter: Payer: Self-pay | Admitting: Internal Medicine

## 2020-08-14 DIAGNOSIS — Z1231 Encounter for screening mammogram for malignant neoplasm of breast: Secondary | ICD-10-CM | POA: Diagnosis not present

## 2020-08-14 DIAGNOSIS — Z1211 Encounter for screening for malignant neoplasm of colon: Secondary | ICD-10-CM | POA: Insufficient documentation

## 2020-08-14 DIAGNOSIS — Z800 Family history of malignant neoplasm of digestive organs: Secondary | ICD-10-CM | POA: Insufficient documentation

## 2020-08-14 DIAGNOSIS — H9192 Unspecified hearing loss, left ear: Secondary | ICD-10-CM | POA: Diagnosis not present

## 2020-08-14 HISTORY — DX: Family history of malignant neoplasm of digestive organs: Z80.0

## 2020-08-14 NOTE — Assessment & Plan Note (Addendum)
Patient agreed to ear lavage due to bilateral cerumen impaction; Ear lavage completed with no difficulty, impaction resolved, TM's look normal; she will continue using OTC kits regularly for prevention

## 2020-08-14 NOTE — Progress Notes (Signed)
Patient ID: Alicia Wilkerson, female   DOB: May 01, 1950, 70 y.o.   MRN: [id number]        Chief Complaint: follow up left hearing loss       HPI:  Alicia Wilkerson is a 70 y.o. female here with c/o acute 4 days left hearing loss without warning, pain, fever , vertigo, tinnitus or HA, seemed to occur suddenly after trying herself to remove some wax she suspected with her finger  Pt denies chest pain, increased sob or doe, wheezing, orthopnea, PND, increased LE swelling, palpitations, dizziness or syncope.   Pt denies fever, wt loss, night sweats, loss of appetite, or other constitutional symptoms        Wt Readings from Last 3 Encounters:  08/14/20 134 lb (60.8 kg)  04/12/20 138 lb (62.6 kg)  11/29/19 134 lb (60.8 kg)   BP Readings from Last 3 Encounters:  08/14/20 132/76  04/12/20 (!) 142/78  03/30/20 137/73         Past Medical History:  Diagnosis Date   Anemia    Anxiety disorder    Basal cell carcinoma (BCC) of left upper arm 12/2015   Basal cell carcinoma (BCC) of right lower leg 04/2016   Borderline hypertension    CAD (coronary artery disease)    a. nonobst by cor CT 12/2018.   CIN II (cervical intraepithelial neoplasia II)    CIN  1   Depression    Eczema    Family history of premature CAD    History of basal cell carcinoma (BCC) excision    left upper arm 2017;   right lower leg 2018;  bilateral lower leg and hip area 2019;   inner right lower leg, outer right thigh, & upper outside left arm 09/ 2020   History of cervical dysplasia 2013   History of hereditary spherocytosis    s/p splenectomy in 1954   History of hyperparathyroidism    per pt yrs ago was put on mega dose of vit d which caused the hyperparathyroid resolved after stopping vit d   History of pelvic fracture 2013   MAI (mycobacterium avium-intracellulare) ([place])    ? possibly by CT 12/2018   Mild hyperlipidemia    Osteoporosis    PONV (postoperative nausea and vomiting)    S/P splenectomy    spherocytosis    Wears glasses    White coat syndrome without diagnosis of hypertension    Past Surgical History:  Procedure Laterality Date   ANKLE SURGERY  03/14/2015    CERVICAL CONIZATION W/BX N/A 12/13/2018   Procedure: CONIZATION CERVIX WITH BIOPSY;  Surgeon: Megan Salon, MD;  Location: [place];  Service: Gynecology;  Laterality: N/A;   CYSTOSCOPY N/A 10/18/2019   Procedure: CYSTOSCOPY;  Surgeon: Megan Salon, MD;  Location: [facility];  Service: Gynecology;  Laterality: N/A;   IR RADIOLOGY PERIPHERAL GUIDED IV START  01/05/2019   IR US GUIDE VASC ACCESS RIGHT  01/05/2019   KNEE SURGERY Left 1991   per pt retained hardward   LEEP N/A 12/13/2018   Procedure: possible LOOP ELECTROSURGICAL EXCISION PROCEDURE (LEEP);  Surgeon: Megan Salon, MD;  Location: [place];  Service: Gynecology;  Laterality: N/A;   ORIF ANKLE FRACTURE Left 03/14/2015   per pt retained hardware   SPLENECTOMY, TOTAL  1954   due to spherocytosis   TOTAL LAPAROSCOPIC HYSTERECTOMY WITH SALPINGECTOMY N/A 10/18/2019   Procedure: TOTAL LAPAROSCOPIC HYSTERECTOMY WITH BILATERAL SALPINGECTOMY AND BILATERAL OOPHORECTOMY;  Surgeon: Hale Bogus  S, MD;  Location: [place];  Service: Gynecology;  Laterality: N/A;  BILATERAL SALPINGECTOMY POSS OOPHORECTOMY   TUBAL LIGATION Bilateral 1995    reports that she has never smoked. She has never used smokeless tobacco. She reports previous alcohol use. She reports that she does not use drugs. family history includes Colon cancer in her mother; Crohn's disease in her daughter; Heart disease in her father and sister; Heart failure in her father; Hypertension in her father; Osteoporosis in her mother; Skin cancer in her father. Allergies  Allergen Reactions   Penicillins Rash    Did it involve swelling of the face/tongue/throat, SOB, or low BP? No Did it involve sudden or severe rash/hives, skin peeling, or any reaction on the inside of your mouth or nose?  #  #  #   YES  #  #  #  Did you need to seek medical attention at a hospital or doctor's office? #  #  #  YES  #  #  #  When did it last happen?   years ago    If all above answers are "NO", may proceed with cephalosporin use.    Sulfa Antibiotics Hives and Other (See Comments)   Penicillin G Other (See Comments)   Current Outpatient Medications on File Prior to Visit  Medication Sig Dispense Refill   ASPIRIN 81 PO Take 81 mg by mouth daily.     clonazePAM (KLONOPIN) 0.5 MG tablet Take 1 tablet (0.5 mg total) by mouth daily as needed for anxiety. 30 tablet 2   escitalopram (LEXAPRO) 5 MG tablet Take 1 tablet (5 mg total) by mouth daily. 90 tablet 1   halobetasol (ULTRAVATE) 0.05 % cream Apply 1 application topically daily as needed (irritation).      losartan (COZAAR) 25 MG tablet TAKE 1 TABLET BY MOUTH EVERY DAY 90 tablet 3   Omega-3 Fatty Acids (FISH OIL PO) Take by mouth.     rosuvastatin (CRESTOR) 20 MG tablet TAKE 1 TABLET BY MOUTH EVERY DAY 90 tablet 2   MODERNA COVID-19 VACCINE 100 MCG/0.5ML injection      No current facility-administered medications on file prior to visit.        ROS:  All others reviewed and negative.  Objective        PE:  BP 132/76 (BP Location: Left Arm, Patient Position: Sitting, Cuff Size: Normal)   Pulse 67   Temp 97.6 F (36.4 C) (Oral)   Ht 5\' 3"  (1.6 m)   Wt 134 lb (60.8 kg)   LMP 02/24/2002   SpO2 97%   BMI 23.74 kg/m                 Constitutional: Pt appears in NAD               HENT: Head: NCAT.                Right Ear: External ear normal.                 Left Ear: External ear normal.                Eyes: . Pupils are equal, round, and reactive to light. Conjunctivae and EOM are normal               Nose: without d/c or deformity               Neck: Neck supple. Gross normal ROM  Cardiovascular: Normal rate and regular rhythm.                 Pulmonary/Chest: Effort normal and breath sounds without rales or wheezing.                                Neurological: Pt is alert. At baseline orientation, motor grossly intact               Skin: Skin is warm. No rashes, no other new lesions, LE edema - none               Psychiatric: Pt behavior is normal without agitation   Micro: none  Cardiac tracings I have personally interpreted today:  none  Pertinent Radiological findings (summarize): none   Lab Results  Component Value Date   WBC 6.5 11/04/2019   HGB 13.3 11/04/2019   HCT 38.7 11/04/2019   PLT 314 11/04/2019   GLUCOSE 94 11/04/2019   CHOL 149 11/04/2019   TRIG 57 11/04/2019   HDL 69 11/04/2019   LDLCALC 67 11/04/2019   ALT 23 11/04/2019   AST 24 11/04/2019   NA 140 11/04/2019   K 4.4 11/04/2019   CL 105 11/04/2019   CREATININE 0.71 11/04/2019   BUN 15 11/04/2019   CO2 25 11/04/2019   TSH 2.05 11/04/2019   HGBA1C 4.0 11/04/2019   Assessment/Plan:  Alicia Wilkerson is a 70 y.o. White or Caucasian [1] female with  has a past medical history of Anemia, Anxiety disorder, Basal cell carcinoma (BCC) of left upper arm (12/2015), Basal cell carcinoma (BCC) of right lower leg (04/2016), Borderline hypertension, CAD (coronary artery disease), CIN II (cervical intraepithelial neoplasia II), Depression, Eczema, Family history of premature CAD, History of basal cell carcinoma (BCC) excision, History of cervical dysplasia (2013), History of hereditary spherocytosis, History of hyperparathyroidism, History of pelvic fracture (2013), MAI (mycobacterium avium-intracellulare) ([place]), Mild hyperlipidemia, Osteoporosis, PONV (postoperative nausea and vomiting), S/P splenectomy, Wears glasses, and White coat syndrome without diagnosis of hypertension.  Acute hearing loss of left ear Patient agreed to ear lavage due to bilateral cerumen impaction; Ear lavage completed with no difficulty, impaction resolved, TM's look normal; she will continue using OTC kits regularly for prevention  Followup: Return if symptoms worsen or fail  to improve.  Cathlean Cower, MD 08/14/2020 1:05 PM [facility]

## 2020-08-14 NOTE — Patient Instructions (Signed)
Your left ear was irrigated of wax today  Please continue all other medications as before, and refills have been done if requested.  Please have the pharmacy call with any other refills you may need.  Please continue your efforts at being more active, low cholesterol diet, and weight control.  Please keep your appointments with your specialists as you may have planned

## 2020-08-22 DIAGNOSIS — Z0100 Encounter for examination of eyes and vision without abnormal findings: Secondary | ICD-10-CM | POA: Diagnosis not present

## 2020-09-17 ENCOUNTER — Encounter: Payer: Self-pay | Admitting: Cardiology

## 2020-10-04 ENCOUNTER — Other Ambulatory Visit: Payer: Self-pay | Admitting: *Deleted

## 2020-10-04 MED ORDER — ROSUVASTATIN CALCIUM 20 MG PO TABS: 20.0000 mg | ORAL_TABLET | Freq: Every day | ORAL | 2 refills | Status: DC

## 2020-10-05 ENCOUNTER — Ambulatory Visit (HOSPITAL_BASED_OUTPATIENT_CLINIC_OR_DEPARTMENT_OTHER): Payer: Medicare HMO | Admitting: Obstetrics & Gynecology

## 2020-10-09 ENCOUNTER — Telehealth: Payer: Self-pay | Admitting: Pharmacist

## 2020-10-09 ENCOUNTER — Ambulatory Visit: Payer: Medicare HMO | Admitting: Cardiology

## 2020-10-09 NOTE — Progress Notes (Addendum)
Chronic Care Management Pharmacy Assistant   Name: Alicia Wilkerson  MRN: [id number] DOB: [birth year]   Reason for Encounter: Disease State   Conditions to be addressed/monitored: HTN   Recent office visits:  None ID  Recent consult visits:  08/14/20 Dr. Cathlean Cower, Internal Medicine Reason:Acute hearing loss of left ear  Hospital visits:  None in previous 6 months  Medications: Outpatient Encounter Medications as of 10/09/2020  Medication Sig   ASPIRIN 81 PO Take 81 mg by mouth daily.   clonazePAM (KLONOPIN) 0.5 MG tablet Take 1 tablet (0.5 mg total) by mouth daily as needed for anxiety.   escitalopram (LEXAPRO) 5 MG tablet Take 1 tablet (5 mg total) by mouth daily.   halobetasol (ULTRAVATE) 0.05 % cream Apply 1 application topically daily as needed (irritation).    losartan (COZAAR) 25 MG tablet TAKE 1 TABLET BY MOUTH EVERY DAY   MODERNA COVID-19 VACCINE 100 MCG/0.5ML injection    Omega-3 Fatty Acids (FISH OIL PO) Take by mouth.   rosuvastatin (CRESTOR) 20 MG tablet Take 1 tablet (20 mg total) by mouth daily.   No facility-administered encounter medications on file as of 10/09/2020.     Recent Office Vitals: BP Readings from Last 3 Encounters:  08/14/20 132/76  04/12/20 (!) 142/78  03/30/20 137/73   Pulse Readings from Last 3 Encounters:  08/14/20 67  04/12/20 62  03/30/20 65    Wt Readings from Last 3 Encounters:  08/14/20 134 lb (60.8 kg)  04/12/20 138 lb (62.6 kg)  11/29/19 134 lb (60.8 kg)     Kidney Function Lab Results  Component Value Date/Time   CREATININE 0.71 11/04/2019 09:55 AM   CREATININE 0.85 10/04/2019 02:36 PM   CREATININE 0.75 12/03/2018 06:56 AM   CREATININE 0.76 04/28/2014 11:24 AM   GFR 83.59 10/20/2018 02:56 PM   GFRNONAA >60 10/04/2019 02:36 PM   GFRAA >60 10/04/2019 02:36 PM    BMP Latest Ref Rng & Units 11/04/2019 10/04/2019 12/03/2018  Glucose 65 - 99 mg/dL 94 98 104(H)  BUN 7 - 25 mg/dL '15 12 19  '$ Creatinine 0.50 - 0.99  mg/dL 0.71 0.85 0.75  BUN/Creat Ratio 6 - 22 (calc) NOT APPLICABLE - -  Sodium A999333 - 146 mmol/L 140 142 139  Potassium 3.5 - 5.3 mmol/L 4.4 4.9 4.2  Chloride 98 - 110 mmol/L 105 105 102  CO2 20 - 32 mmol/L '25 28 24  '$ Calcium 8.6 - 10.4 mg/dL 9.7 9.8 9.7     Contacted patient on 10/09/20 to discuss hypertension disease state Contacted patient on 10/15/20 to discuss hypertension disease state Contacted patient on 10/19/20 to discuss hypertension disease state  Current antihypertensive regimen:  Losartan 25 mg 1 tab daily  Patient verbally confirms she is taking the above medications as directed. Yes  How often are you checking your Blood Pressure? 1-2x per week  she checks her blood pressure  anytime  after taking her medication.  Current home BP readings: Patient states blood pressure in the normal range  Wrist or arm cuff:wrist cuff Caffeine intake:20 oz coffee Salt intake:uses no salt OTC medications including pseudoephedrine or NSAIDs?  Any readings above 180/120? No If yes any symptoms of hypertensive emergency? patient denies any symptoms of high blood pressure   What recent interventions/DTPs have been made by any provider to improve Blood Pressure control since last CPP Visit: None ID  Any recent hospitalizations or ED visits since last visit with CPP? No  What diet changes have been  made to improve Blood Pressure Control?  Patient states that she only eats once a day, does intermit fasting  What exercise is being done to improve your Blood Pressure Control?  Patient states she walks 30 minutes 3 04 4 days  Adherence Review: Is the patient currently on ACE/ARB medication? Yes Does the patient have >5 day gap between last estimated fill dates? No   Star Rating Drugs:  Medication:  Last Fill: [facility] Rosuvastatin 20 mg 10/04/20 90 Losartan 25 mg 09/01/20  90    Care Gaps: Last annual wellness visit:11/04/19   PCP appointment on 11/13/20 Dr. Genia Hotter  [facility] Clinical Pharmacist Assistant [phone]   Time spent:29

## 2020-10-25 ENCOUNTER — Ambulatory Visit (HOSPITAL_BASED_OUTPATIENT_CLINIC_OR_DEPARTMENT_OTHER): Payer: Medicare HMO | Admitting: Family

## 2020-10-25 ENCOUNTER — Other Ambulatory Visit: Payer: Self-pay

## 2020-10-25 ENCOUNTER — Encounter (HOSPITAL_BASED_OUTPATIENT_CLINIC_OR_DEPARTMENT_OTHER): Payer: Self-pay | Admitting: Family

## 2020-10-25 VITALS — BP 128/66 | HR 61 | Ht 63.0 in | Wt 133.0 lb

## 2020-10-25 DIAGNOSIS — E785 Hyperlipidemia, unspecified: Secondary | ICD-10-CM

## 2020-10-25 DIAGNOSIS — I25118 Atherosclerotic heart disease of native coronary artery with other forms of angina pectoris: Secondary | ICD-10-CM

## 2020-10-25 DIAGNOSIS — I10 Essential (primary) hypertension: Secondary | ICD-10-CM

## 2020-10-25 NOTE — Patient Instructions (Addendum)
Medication Instructions:  Continue your current medications.   *If you need a refill on your cardiac medications before your next appointment, please call your pharmacy*  Lab Work: None ordered today. Our goal is for your next cholesterol panel check with Dr. Quay Burow is for your LDL "bad cholesterol" to be less than 70.  If you have labs (blood work) drawn today and your tests are completely normal, you will receive your results only by: [place] (if you have MyChart) OR A paper copy in the mail If you have any lab test that is abnormal or we need to change your treatment, we will call you to review the results.  Testing/Procedures: Your EKG today was stable compared to previous.   Follow-Up: At [facility], you and your health needs are our priority.  As part of our continuing mission to provide you with exceptional heart care, we have created designated Provider Care Teams.  These Care Teams include your primary Cardiologist (physician) and Advanced Practice Providers (APPs -  Physician Assistants and Nurse Practitioners) who all work together to provide you with the care you need, when you need it.  We recommend signing up for the patient portal called "MyChart".  Sign up information is provided on this After Visit Summary.  MyChart is used to connect with patients for Virtual Visits (Telemedicine).  Patients are able to view lab/test results, encounter notes, upcoming appointments, etc.  Non-urgent messages can be sent to your provider as well.   To learn more about what you can do with MyChart, go to [email or web address].    Your next appointment:   6 month(s)  The format for your next appointment:   In Person  Provider:   You may see Freada Bergeron, MD or one of the following Advanced Practice Providers on your designated Care Team:   Richardson Dopp, PA-C [place], [place] Loel Dubonnet, NP    Other Instructions  For coronary artery disease often called  "heart disease" we aim for optimal guideline directed medical therapy. We use the "A, B, C"s to help keep [place] on track!  A = Aspirin '81mg'$  daily B = Blood pressure control. C = Cholesterol control. You take Rosuvastatin to help control your cholesterol.   Heart Healthy Diet Recommendations: A low-salt diet is recommended. Meats should be grilled, baked, or boiled. Avoid fried foods. Focus on lean protein sources like fish or chicken with vegetables and fruits. The American Heart Association is a Microbiologist!    Exercise recommendations: The American Heart Association recommends 150 minutes of moderate intensity exercise weekly. Try 30 minutes of moderate intensity exercise 4-5 times per week. This could include walking, jogging, or swimming.

## 2020-10-25 NOTE — Progress Notes (Signed)
Office Visit    Patient Name: Alicia Wilkerson Date of Encounter: 10/25/2020  PCP:  Binnie Rail, MD   [place]  Cardiologist:  Freada Bergeron, MD  Advanced Practice Provider:  No care team member to display Electrophysiologist:  None      Chief Complaint    Alicia Wilkerson is a 70 y.o. female with a hx of nxiety, anemia, secondary hyperparathyroidism (due to excess vitamin D), osteoporosis/osteopenia, spherocytosis s/p splenectomy, mild HTN, HLD, CAD, possible MAI by CT 12/2018 presents today for follow up of CAD.   Past Medical History    Past Medical History:  Diagnosis Date   Anemia    Anxiety disorder    Basal cell carcinoma (BCC) of left upper arm 12/2015   Basal cell carcinoma (BCC) of right lower leg 04/2016   Borderline hypertension    CAD (coronary artery disease)    a. nonobst by cor CT 12/2018.   CIN II (cervical intraepithelial neoplasia II)    CIN  1   Depression    Eczema    Family history of premature CAD    History of basal cell carcinoma (BCC) excision    left upper arm 2017;   right lower leg 2018;  bilateral lower leg and hip area 2019;   inner right lower leg, outer right thigh, & upper outside left arm 09/ 2020   History of cervical dysplasia 2013   History of hereditary spherocytosis    s/p splenectomy in 1954   History of hyperparathyroidism    per pt yrs ago was put on mega dose of vit d which caused the hyperparathyroid resolved after stopping vit d   History of pelvic fracture 2013   MAI (mycobacterium avium-intracellulare) ([place])    ? possibly by CT 12/2018   Mild hyperlipidemia    Osteoporosis    PONV (postoperative nausea and vomiting)    S/P splenectomy    spherocytosis   Wears glasses    White coat syndrome without diagnosis of hypertension    Past Surgical History:  Procedure Laterality Date   ANKLE SURGERY  03/14/2015    CERVICAL CONIZATION W/BX N/A 12/13/2018   Procedure: CONIZATION CERVIX WITH  BIOPSY;  Surgeon: Megan Salon, MD;  Location: [facility];  Service: Gynecology;  Laterality: N/A;   CYSTOSCOPY N/A 10/18/2019   Procedure: CYSTOSCOPY;  Surgeon: Megan Salon, MD;  Location: [facility];  Service: Gynecology;  Laterality: N/A;   IR RADIOLOGY PERIPHERAL GUIDED IV START  01/05/2019   IR US GUIDE VASC ACCESS RIGHT  01/05/2019   KNEE SURGERY Left 1991   per pt retained hardward   LEEP N/A 12/13/2018   Procedure: possible LOOP ELECTROSURGICAL EXCISION PROCEDURE (LEEP);  Surgeon: Megan Salon, MD;  Location: [facility];  Service: Gynecology;  Laterality: N/A;   ORIF ANKLE FRACTURE Left 03/14/2015   per pt retained hardware   SPLENECTOMY, TOTAL  1954   due to spherocytosis   TOTAL LAPAROSCOPIC HYSTERECTOMY WITH SALPINGECTOMY N/A 10/18/2019   Procedure: TOTAL LAPAROSCOPIC HYSTERECTOMY WITH BILATERAL SALPINGECTOMY AND BILATERAL OOPHORECTOMY;  Surgeon: Megan Salon, MD;  Location: [facility];  Service: Gynecology;  Laterality: N/A;  BILATERAL SALPINGECTOMY POSS OOPHORECTOMY   TUBAL LIGATION Bilateral 1995    Allergies  Allergies  Allergen Reactions   Penicillins Rash    Did it involve swelling of the face/tongue/throat, SOB, or low BP? No Did it involve sudden or severe rash/hives, skin peeling, or  any reaction on the inside of your mouth or nose?  #  #  #  YES  #  #  #  Did you need to seek medical attention at a hospital or doctor's office? #  #  #  YES  #  #  #  When did it last happen?   years ago    If all above answers are "NO", may proceed with cephalosporin use.    Sulfa Antibiotics Hives and Other (See Comments)   Penicillin G Other (See Comments)    History of Present Illness    Alicia Wilkerson is a 70 y.o. female with a hx of anxiety, anemia, secondary hyperparathyroidism (due to excess vitamin D), osteoporosis/osteopenia, spherocytosis s/p splenectomy, mild HTN, HLD, CAD, possible MAI by CT 12/2018 last seen 04/12/20 by Dr.  Meda Coffee  Previous echo 10/2018 with LVEF 60-65%, no LVH, elevated LVEDP, diastolic dysfunction, normal PASP, mild TR. Coronary CTA 12/2018 with calcium score 544 (96th percentile for age/sex) with diffuse moderate to severe calcified plaque in the prox to mid LAD with possibly severe stenosis in mid LAD. FFR of LAD were prox 0.94, mid 0.92, distal 0.82, arguing against hemodynamically significant stenosis. Chest overread indicated appearance of the lungs suggested chronic indolent atypical infectious process such as myobacterium avium intracellulare (MAI) and aortic atherosclerosis. Pulmonology referral recommended but patient declined. Crestor and Aspirin were initiated.   Presents today for follow up. She has three children: one who is local, on in [place], and one in [place]. Since last seen she has retired and is very much enjoying it. Reports this has reduced her stress and enabled her to exercise more consistently as well as eat better. Reports no shortness of breath nor dyspnea on exertion. Reports no chest pain, pressure, or tightness. No edema, orthopnea, PND. Reports no palpitations.    EKGs/Labs/Other Studies Reviewed:   The following studies were reviewed today: Cardiac CTA 01/05/2019 IMPRESSION: 1. Coronary calcium score of 544. This was 6 percentile for age and sex matched control.   2. Normal coronary origin with right dominance.   3. Diffuse moderate to severe calcified plaque in the proximal to mid LAD with possibly severe stenosis in the mid LAD. Additional analysis with CT FFR will be submitted.  FFR  1. Left Main:  No significant stenosis.   2. LAD: Proximal: 0.94, mid: 0.92, distal: 0.82. 3. LCX: No significant stenosis. 4. RCA: No significant stenosis.   IMPRESSION: 1. CT FFR analysis didn't show any significant stenosis. Aggressive medical management is recommended.  Non Cardiac Read IMPRESSION: 1. The appearance of the lungs suggests chronic indolent  atypical infectious process such as mycobacterium avium intracellulare (MAI). Outpatient referral to Pulmonology for further evaluation is suggested. 2. Aortic atherosclerosis.     Echo 11/24/18  1. Left ventricular ejection fraction, by visual estimation, is 60 to  65%. The left ventricle has normal function. Normal left ventricular size.  There is no left ventricular hypertrophy.   2. Elevated left ventricular end-diastolic pressure.   3. Left ventricular diastolic Doppler parameters are consistent with  impaired relaxation pattern of LV diastolic filling.   4. Global right ventricle has normal systolic function.The right  ventricular size is normal. No increase in right ventricular wall  thickness.   5. Left atrial size was normal.   6. Right atrial size was normal.   7. The mitral valve is normal in structure. No evidence of mitral valve  regurgitation. No evidence of mitral stenosis.   8.  The tricuspid valve is normal in structure. Tricuspid valve  regurgitation is mild.   9. The aortic valve was not well visualized Aortic valve regurgitation  was not visualized by color flow Doppler. Structurally normal aortic  valve, with no evidence of sclerosis or stenosis.  10. The pulmonic valve was normal in structure. Pulmonic valve  regurgitation is not visualized by color flow Doppler.  11. Normal pulmonary artery systolic pressure.  12. The inferior vena cava is normal in size with greater than 50%  respiratory variability, suggesting right atrial pressure of 3 mmHg.   EKG:  EKG is ordered today.  The ekg ordered today demonstrates SR 61 bpm with first degree AV block (PR 256) with LVH and repolarization abnormalities. No acute St/T wave changes.   Recent Labs: 11/04/2019: ALT 23; BUN 15; Creat 0.71; Hemoglobin 13.3; Platelets 314; Potassium 4.4; Sodium 140; TSH 2.05  Recent Lipid Panel    Component Value Date/Time   CHOL 149 11/04/2019 0955   CHOL 135 03/25/2019 0829   TRIG 57  11/04/2019 0955   HDL 69 11/04/2019 0955   HDL 75 03/25/2019 0829   CHOLHDL 2.2 11/04/2019 0955   VLDL 13.2 10/20/2018 1456   LDLCALC 67 11/04/2019 0955    Home Medications   Current Meds  Medication Sig   ASPIRIN 81 PO Take 81 mg by mouth daily.   clonazePAM (KLONOPIN) 0.5 MG tablet Take 1 tablet (0.5 mg total) by mouth daily as needed for anxiety.   escitalopram (LEXAPRO) 5 MG tablet Take 1 tablet (5 mg total) by mouth daily.   halobetasol (ULTRAVATE) 0.05 % cream Apply 1 application topically daily as needed (irritation).    losartan (COZAAR) 25 MG tablet TAKE 1 TABLET BY MOUTH EVERY DAY   MODERNA COVID-19 VACCINE 100 MCG/0.5ML injection    Omega-3 Fatty Acids (FISH OIL PO) Take by mouth.   rosuvastatin (CRESTOR) 20 MG tablet Take 1 tablet (20 mg total) by mouth daily.     Review of Systems     All other systems reviewed and are otherwise negative except as noted above.  Physical Exam    VS:  BP 128/66   Pulse 61   Ht '5\' 3"'$  (1.6 m)   Wt 133 lb (60.3 kg)   LMP 02/24/2002   BMI 23.56 kg/m  , BMI Body mass index is 23.56 kg/m.  Wt Readings from Last 3 Encounters:  10/25/20 133 lb (60.3 kg)  08/14/20 134 lb (60.8 kg)  04/12/20 138 lb (62.6 kg)     GEN: Well nourished, well developed, in no acute distress. HEENT: normal. Neck: Supple, no JVD, carotid bruits, or masses. Cardiac: RRR, no murmurs, rubs, or gallops. No clubbing, cyanosis, edema.  Radials/PT 2+ and equal bilaterally.  Respiratory:  Respirations regular and unlabored, clear to auscultation bilaterally. GI: Soft, nontender, nondistended. MS: No deformity or atrophy. Skin: Warm and dry, no rash. Neuro:  Strength and sensation are intact. Psych: Normal affect.  Assessment & Plan    CAD - Stable with no anginal symptoms. No indication for ischemic evaluation.  GDMT includes aspirin, rosuvastatin. Heart healthy diet and regular cardiovascular exercise encouraged.    MAI - Possible finding by cardiac CTA  12/2018. Has politely declined referral to pulmonology.   HTN - BP well controlled. Continue current antihypertensive regimen.    HLD, LDL goal <70 - 10/2019 LDL 67. At goal of <70. Continue current dose Rosuvastatin.   Disposition: Follow up in 6 month(s) with Dr. Johney Frame or APP.  Signed, Loel Dubonnet, NP 10/25/2020, 1:41 PM [facility]

## 2020-10-26 ENCOUNTER — Encounter (HOSPITAL_BASED_OUTPATIENT_CLINIC_OR_DEPARTMENT_OTHER): Payer: Self-pay | Admitting: Family

## 2020-11-05 ENCOUNTER — Encounter (HOSPITAL_BASED_OUTPATIENT_CLINIC_OR_DEPARTMENT_OTHER): Payer: Self-pay | Admitting: Obstetrics & Gynecology

## 2020-11-05 ENCOUNTER — Ambulatory Visit (INDEPENDENT_AMBULATORY_CARE_PROVIDER_SITE_OTHER): Payer: Medicare HMO | Admitting: Obstetrics & Gynecology

## 2020-11-05 ENCOUNTER — Other Ambulatory Visit (HOSPITAL_COMMUNITY)
Admission: RE | Admit: 2020-11-05 | Discharge: 2020-11-05 | Disposition: A | Discharge status: Home / Self Care | Payer: Medicare HMO | Source: Ambulatory Visit | Attending: Obstetrics & Gynecology | Admitting: Obstetrics & Gynecology

## 2020-11-05 ENCOUNTER — Other Ambulatory Visit: Payer: Self-pay

## 2020-11-05 VITALS — BP 167/80 | HR 63 | Ht 63.0 in | Wt 133.6 lb

## 2020-11-05 DIAGNOSIS — Z90710 Acquired absence of both cervix and uterus: Secondary | ICD-10-CM | POA: Insufficient documentation

## 2020-11-05 DIAGNOSIS — B977 Papillomavirus as the cause of diseases classified elsewhere: Secondary | ICD-10-CM | POA: Diagnosis present

## 2020-11-05 DIAGNOSIS — N871 Moderate cervical dysplasia: Secondary | ICD-10-CM | POA: Diagnosis present

## 2020-11-05 DIAGNOSIS — Z9189 Other specified personal risk factors, not elsewhere classified: Secondary | ICD-10-CM | POA: Diagnosis not present

## 2020-11-05 DIAGNOSIS — M8580 Other specified disorders of bone density and structure, unspecified site: Secondary | ICD-10-CM | POA: Diagnosis not present

## 2020-11-05 DIAGNOSIS — R69 Illness, unspecified: Secondary | ICD-10-CM | POA: Diagnosis not present

## 2020-11-05 DIAGNOSIS — Z124 Encounter for screening for malignant neoplasm of cervix: Secondary | ICD-10-CM | POA: Insufficient documentation

## 2020-11-05 NOTE — Progress Notes (Signed)
70 y.o. G18P2003 Widowed White or Caucasian female here for breast and pelvic exam.  I am also following her for h/o recurrent abnormal pap smear.  Had hysterectomy 10/2019.  Denies vaginal bleeding.    Patient's last menstrual period was 02/24/2002.          Sexually active: No.  H/O STD:  yes, high risk HPV Lifetime hx of >5 partners: yes  Health Maintenance: PCP:  Dr. Quay Burow.  Last wellness appt was sept 2021.  Did blood work at that appt:  yes Vaccines are up to date:  has not done shingles vaccination Colonoscopy:  05/23/2013, aware this is due.   MMG:  08/14/2020 Negative BMD:  01/05/2015 Last pap smear:  10/18/2019 Negative.   H/o abnormal pap smear:  yes   reports that she has never smoked. She has never used smokeless tobacco. She reports that she does not currently use alcohol. She reports that she does not use drugs.  Past Medical History:  Diagnosis Date   Anemia    Anxiety disorder    Basal cell carcinoma (BCC) of left upper arm 12/2015   Basal cell carcinoma (BCC) of right lower leg 04/2016   Borderline hypertension    CAD (coronary artery disease)    a. nonobst by cor CT 12/2018.   CIN II (cervical intraepithelial neoplasia II)    CIN  1   Depression    Eczema    Family history of premature CAD    History of basal cell carcinoma (BCC) excision    left upper arm 2017;   right lower leg 2018;  bilateral lower leg and hip area 2019;   inner right lower leg, outer right thigh, & upper outside left arm 09/ 2020   History of cervical dysplasia 2013   History of hereditary spherocytosis    s/p splenectomy in 1954   History of hyperparathyroidism    per pt yrs ago was put on mega dose of vit d which caused the hyperparathyroid resolved after stopping vit d   History of pelvic fracture 2013   MAI (mycobacterium avium-intracellulare) ([place])    ? possibly by CT 12/2018   Mild hyperlipidemia    Osteoporosis    PONV (postoperative nausea and vomiting)    S/P splenectomy     spherocytosis   Wears glasses    White coat syndrome without diagnosis of hypertension     Past Surgical History:  Procedure Laterality Date   ANKLE SURGERY  03/14/2015    CERVICAL CONIZATION W/BX N/A 12/13/2018   Procedure: CONIZATION CERVIX WITH BIOPSY;  Surgeon: Megan Salon, MD;  Location: [place];  Service: Gynecology;  Laterality: N/A;   CYSTOSCOPY N/A 10/18/2019   Procedure: CYSTOSCOPY;  Surgeon: Megan Salon, MD;  Location: [facility];  Service: Gynecology;  Laterality: N/A;   IR RADIOLOGY PERIPHERAL GUIDED IV START  01/05/2019   IR US GUIDE VASC ACCESS RIGHT  01/05/2019   KNEE SURGERY Left 1991   per pt retained hardward   LEEP N/A 12/13/2018   Procedure: possible LOOP ELECTROSURGICAL EXCISION PROCEDURE (LEEP);  Surgeon: Megan Salon, MD;  Location: [place];  Service: Gynecology;  Laterality: N/A;   ORIF ANKLE FRACTURE Left 03/14/2015   per pt retained hardware   SPLENECTOMY, TOTAL  1954   due to spherocytosis   TOTAL LAPAROSCOPIC HYSTERECTOMY WITH SALPINGECTOMY N/A 10/18/2019   Procedure: TOTAL LAPAROSCOPIC HYSTERECTOMY WITH BILATERAL SALPINGECTOMY AND BILATERAL OOPHORECTOMY;  Surgeon: Megan Salon, MD;  Location: [facility];  Service: Gynecology;  Laterality: N/A;  BILATERAL SALPINGECTOMY POSS OOPHORECTOMY   TUBAL LIGATION Bilateral 1995    Current Outpatient Medications  Medication Sig Dispense Refill   ASPIRIN 81 PO Take 81 mg by mouth daily.     clonazePAM (KLONOPIN) 0.5 MG tablet Take 1 tablet (0.5 mg total) by mouth daily as needed for anxiety. 30 tablet 2   escitalopram (LEXAPRO) 5 MG tablet Take 1 tablet (5 mg total) by mouth daily. 90 tablet 1   halobetasol (ULTRAVATE) 0.05 % cream Apply 1 application topically daily as needed (irritation).      losartan (COZAAR) 25 MG tablet TAKE 1 TABLET BY MOUTH EVERY DAY 90 tablet 3   MODERNA COVID-19 VACCINE 100 MCG/0.5ML injection      Omega-3 Fatty Acids (FISH OIL PO) Take by mouth.      rosuvastatin (CRESTOR) 20 MG tablet Take 1 tablet (20 mg total) by mouth daily. 90 tablet 2   No current facility-administered medications for this visit.    Family History  Problem Relation Age of Onset   Heart failure Father    Skin cancer Father    Hypertension Father    Heart disease Father    Osteoporosis Mother    Colon cancer Mother        deceased   Heart disease Sister    Crohn's disease Daughter     Review of Systems  All other systems reviewed and are negative.  Exam:   BP (!) 167/80 (BP Location: Left Arm, Patient Position: Sitting, Cuff Size: Small)   Pulse 63   Ht '5\' 3"'$  (1.6 m)   Wt 133 lb 9.6 oz (60.6 kg)   LMP 02/24/2002   BMI 23.67 kg/m   Height: '5\' 3"'$  (160 cm)  General appearance: alert, cooperative and appears stated age Breasts: normal appearance, no masses or tenderness Abdomen: soft, non-tender; bowel sounds normal; no masses,  no organomegaly Lymph nodes: Cervical, supraclavicular, and axillary nodes normal.  No abnormal inguinal nodes palpated Neurologic: Grossly normal  Pelvic: External genitalia:  no lesions              Urethra:  normal appearing urethra with no masses, tenderness or lesions              Bartholins and Skenes: normal                 Vagina: normal appearing vagina with atrophic changes and no discharge, no lesions              Cervix: absent              Pap taken: Yes.   Bimanual Exam:  Uterus:  uterus absent              Adnexa: no mass, fullness, tenderness               Rectovaginal: Confirms               Anus:  normal sphincter tone, no lesions  Chaperone, Octaviano Batty, CMA, was present for exam.  Assessment/Plan: 1. GYN exam for high-risk Medicare patient - pap and HR HPV obtained today - MMG up to date - colonoscopy due.  Pt aware. - BMD 2016. Ordered for next year. - Blood work planned with Dr. Quay Burow  - vaccines reviewed  2. Dysplasia of cervix, high grade CIN 2 - Cytology - PAP( )  3. H/O:  hysterectomy  4. High risk HPV infection

## 2020-11-09 LAB — CYTOLOGY - PAP
Comment: NEGATIVE
Comment: NEGATIVE
HPV 16: NEGATIVE
HPV 18 / 45: NEGATIVE
High risk HPV: POSITIVE — AB

## 2020-11-12 NOTE — Patient Instructions (Addendum)
Flu immunization administered today.    Schedule your colonoscopy   Blood work was ordered.     Medications changes include :   increase lexapro to 10 mg daily  Your prescription(s) have been submitted to your pharmacy. Please take as directed and contact our office if you believe you are having problem(s) with the medication(s).    Please followup in 1 year    Health Maintenance, Female Adopting a healthy lifestyle and getting preventive care are important in promoting health and wellness. Ask your health care provider about: The right schedule for you to have regular tests and exams. Things you can do on your own to prevent diseases and keep yourself healthy. What should I know about diet, weight, and exercise? Eat a healthy diet  Eat a diet that includes plenty of vegetables, fruits, low-fat dairy products, and lean protein. Do not eat a lot of foods that are high in solid fats, added sugars, or sodium. Maintain a healthy weight Body mass index (BMI) is used to identify weight problems. It estimates body fat based on height and weight. Your health care provider can help determine your BMI and help you achieve or maintain a healthy weight. Get regular exercise Get regular exercise. This is one of the most important things you can do for your health. Most adults should: Exercise for at least 150 minutes each week. The exercise should increase your heart rate and make you sweat (moderate-intensity exercise). Do strengthening exercises at least twice a week. This is in addition to the moderate-intensity exercise. Spend less time sitting. Even light physical activity can be beneficial. Watch cholesterol and blood lipids Have your blood tested for lipids and cholesterol at 68 years of age, then have this test every 5 years. Have your cholesterol levels checked more often if: Your lipid or cholesterol levels are high. You are older than 68 years of age. You are at high risk  for heart disease. What should I know about cancer screening? Depending on your health history and family history, you may need to have cancer screening at various ages. This may include screening for: Breast cancer. Cervical cancer. Colorectal cancer. Skin cancer. Lung cancer. What should I know about heart disease, diabetes, and high blood pressure? Blood pressure and heart disease High blood pressure causes heart disease and increases the risk of stroke. This is more likely to develop in people who have high blood pressure readings, are of African descent, or are overweight. Have your blood pressure checked: Every 3-5 years if you are 53-71 years of age. Every year if you are 2 years old or older. Diabetes Have regular diabetes screenings. This checks your fasting blood sugar level. Have the screening done: Once every three years after age 35 if you are at a normal weight and have a low risk for diabetes. More often and at a younger age if you are overweight or have a high risk for diabetes. What should I know about preventing infection? Hepatitis B If you have a higher risk for hepatitis B, you should be screened for this virus. Talk with your health care provider to find out if you are at risk for hepatitis B infection. Hepatitis C Testing is recommended for: Everyone born from [birth year] through 1963. Anyone with known risk factors for hepatitis C. Sexually transmitted infections (STIs) Get screened for STIs, including gonorrhea and chlamydia, if: You are sexually active and are younger than 68 years of age. You are older than 68 years of  age and your health care provider tells you that you are at risk for this type of infection. Your sexual activity has changed since you were last screened, and you are at increased risk for chlamydia or gonorrhea. Ask your health care provider if you are at risk. Ask your health care provider about whether you are at high risk for HIV. Your health  care provider may recommend a prescription medicine to help prevent HIV infection. If you choose to take medicine to prevent HIV, you should first get tested for HIV. You should then be tested every 3 months for as long as you are taking the medicine. Pregnancy If you are about to stop having your period (premenopausal) and you may become pregnant, seek counseling before you get pregnant. Take 400 to 800 micrograms (mcg) of folic acid every day if you become pregnant. Ask for birth control (contraception) if you want to prevent pregnancy. Osteoporosis and menopause Osteoporosis is a disease in which the bones lose minerals and strength with aging. This can result in bone fractures. If you are 52 years old or older, or if you are at risk for osteoporosis and fractures, ask your health care provider if you should: Be screened for bone loss. Take a calcium or vitamin D supplement to lower your risk of fractures. Be given hormone replacement therapy (HRT) to treat symptoms of menopause. Follow these instructions at home: Lifestyle Do not use any products that contain nicotine or tobacco, such as cigarettes, e-cigarettes, and chewing tobacco. If you need help quitting, ask your health care provider. Do not use street drugs. Do not share needles. Ask your health care provider for help if you need support or information about quitting drugs. Alcohol use Do not drink alcohol if: Your health care provider tells you not to drink. You are pregnant, may be pregnant, or are planning to become pregnant. If you drink alcohol: Limit how much you use to 0-1 drink a day. Limit intake if you are breastfeeding. Be aware of how much alcohol is in your drink. In the [place] one drink equals one 12 oz bottle of beer (355 mL), one 5 oz glass of wine (148 mL), or one 1 oz glass of hard liquor (44 mL). General instructions Schedule regular health, dental, and eye exams. Stay current with your vaccines. Tell your  health care provider if: You often feel depressed. You have ever been abused or do not feel safe at home. Summary Adopting a healthy lifestyle and getting preventive care are important in promoting health and wellness. Follow your health care provider's instructions about healthy diet, exercising, and getting tested or screened for diseases. Follow your health care provider's instructions on monitoring your cholesterol and blood pressure. This information is not intended to replace advice given to you by your health care provider. Make sure you discuss any questions you have with your health care provider. Document Revised: 04/20/2020 Document Reviewed: 02/03/2018 Elsevier Patient Education  2022 [employer].

## 2020-11-12 NOTE — Progress Notes (Signed)
Subjective:    Patient ID: Alicia Wilkerson, female    DOB: [birth year], 70 y.o.   MRN: [id number]   This visit occurred during the SARS-CoV-2 public health emergency.  Safety protocols were in place, including screening questions prior to the visit, additional usage of staff PPE, and extensive cleaning of exam room while observing appropriate contact time as indicated for disinfecting solutions.    HPI She is here for a physical exam.   Overall doing well.   Anxiety varies and not always controlled.  It depends on what is going on, but sometimes her anxiety gets heightened.  Medications and allergies reviewed with patient and updated if appropriate.  Patient Active Problem List   Diagnosis Date Noted   Family history of malignant neoplasm of gastrointestinal tract 08/14/2020   Acute hearing loss of left ear 08/14/2020   Aortic atherosclerosis ([place]) 11/04/2019   Hyperlipidemia 11/03/2019   CAD (coronary artery disease) 11/03/2019   S/P total hysterectomy and bilateral salpingo-oophorectomy, 2021 11/03/2019   Hyperglycemia 10/19/2018   Abnormal EKG 04/26/2018   Hearing loss 10/13/2017   Basal cell carcinoma (BCC) of skin of lower extremity including hip 05/19/2017   Anxiety 10/10/2016   Eczema 10/10/2016   Back spasm 10/10/2016   H/O splenectomy for hemolytic spherocytosis 10/10/2016   H/O basal cell carcinoma excision 10/10/2016   Dysplasia of cervix, high grade CIN 2 08/21/2016    Current Outpatient Medications on File Prior to Visit  Medication Sig Dispense Refill   ASPIRIN 81 PO Take 81 mg by mouth daily.     clonazePAM (KLONOPIN) 0.5 MG tablet Take 1 tablet (0.5 mg total) by mouth daily as needed for anxiety. 30 tablet 2   halobetasol (ULTRAVATE) 0.05 % cream Apply 1 application topically daily as needed (irritation).      losartan (COZAAR) 25 MG tablet TAKE 1 TABLET BY MOUTH EVERY DAY 90 tablet 3   Omega-3 Fatty Acids (FISH OIL PO) Take by mouth.     rosuvastatin  (CRESTOR) 20 MG tablet Take 1 tablet (20 mg total) by mouth daily. 90 tablet 2   [DISCONTINUED] escitalopram (LEXAPRO) 5 MG tablet Take 1 tablet (5 mg total) by mouth daily. 90 tablet 1   No current facility-administered medications on file prior to visit.    Past Medical History:  Diagnosis Date   Anemia    Anxiety disorder    Basal cell carcinoma (BCC) of left upper arm 12/2015   Basal cell carcinoma (BCC) of right lower leg 04/2016   Borderline hypertension    CAD (coronary artery disease)    a. nonobst by cor CT 12/2018.   CIN II (cervical intraepithelial neoplasia II)    CIN  1   Depression    Eczema    Family history of premature CAD    History of basal cell carcinoma (BCC) excision    left upper arm 2017;   right lower leg 2018;  bilateral lower leg and hip area 2019;   inner right lower leg, outer right thigh, & upper outside left arm 09/ 2020   History of cervical dysplasia 2013   History of hereditary spherocytosis    s/p splenectomy in 1954   History of hyperparathyroidism    per pt yrs ago was put on mega dose of vit d which caused the hyperparathyroid resolved after stopping vit d   History of pelvic fracture 2013   MAI (mycobacterium avium-intracellulare) ([place])    ? possibly by CT 12/2018  Mild hyperlipidemia    Osteoporosis    PONV (postoperative nausea and vomiting)    S/P splenectomy    spherocytosis   Wears glasses    White coat syndrome without diagnosis of hypertension     Past Surgical History:  Procedure Laterality Date   ANKLE SURGERY  03/14/2015    CERVICAL CONIZATION W/BX N/A 12/13/2018   Procedure: CONIZATION CERVIX WITH BIOPSY;  Surgeon: Megan Salon, MD;  Location: [facility];  Service: Gynecology;  Laterality: N/A;   CYSTOSCOPY N/A 10/18/2019   Procedure: CYSTOSCOPY;  Surgeon: Megan Salon, MD;  Location: [facility];  Service: Gynecology;  Laterality: N/A;   IR RADIOLOGY PERIPHERAL GUIDED IV START  01/05/2019   IR US GUIDE VASC  ACCESS RIGHT  01/05/2019   KNEE SURGERY Left 1991   per pt retained hardward   LEEP N/A 12/13/2018   Procedure: possible LOOP ELECTROSURGICAL EXCISION PROCEDURE (LEEP);  Surgeon: Megan Salon, MD;  Location: [place];  Service: Gynecology;  Laterality: N/A;   ORIF ANKLE FRACTURE Left 03/14/2015   per pt retained hardware   SPLENECTOMY, TOTAL  1954   due to spherocytosis   TOTAL LAPAROSCOPIC HYSTERECTOMY WITH SALPINGECTOMY N/A 10/18/2019   Procedure: TOTAL LAPAROSCOPIC HYSTERECTOMY WITH BILATERAL SALPINGECTOMY AND BILATERAL OOPHORECTOMY;  Surgeon: Megan Salon, MD;  Location: [place];  Service: Gynecology;  Laterality: N/A;  BILATERAL SALPINGECTOMY POSS OOPHORECTOMY   TUBAL LIGATION Bilateral 1995    Social History   Socioeconomic History   Marital status: Widowed    Spouse name: Not on file   Number of children: Not on file   Years of education: Not on file   Highest education level: Not on file  Occupational History   Not on file  Tobacco Use   Smoking status: Never   Smokeless tobacco: Never  Vaping Use   Vaping Use: Never used  Substance and Sexual Activity   Alcohol use: Not Currently   Drug use: No   Sexual activity: Not Currently    Partners: Male    Birth control/protection: Surgical, Post-menopausal    Comment: BTL & hysterectomy  Other Topics Concern   Not on file  Social History Narrative   Not on file   Social Determinants of Health   Financial Resource Strain: Low Risk    Difficulty of Paying Living Expenses: Not hard at all  Food Insecurity: No Food Insecurity   Worried About Charity fundraiser in the Last Year: Never true   [place] in the Last Year: Never true  Transportation Needs: No Transportation Needs   Lack of Transportation (Medical): No   Lack of Transportation (Non-Medical): No  Physical Activity: Sufficiently Active   Days of Exercise per Week: 5 days   Minutes of Exercise per Session: 30 min  Stress: Not on  file  Social Connections: Unknown   Frequency of Communication with Friends and Family: More than three times a week   Frequency of Social Gatherings with Friends and Family: Never   Attends Religious Services: Never   Marine scientist or Organizations: No   Attends Music therapist: Never   Marital Status: Patient refused    Family History  Problem Relation Age of Onset   Heart failure Father    Skin cancer Father    Hypertension Father    Heart disease Father    Osteoporosis Mother    Colon cancer Mother  deceased   Heart disease Sister    Crohn's disease Daughter     Review of Systems  Constitutional:  Negative for chills and fever.  Eyes:  Negative for visual disturbance.  Respiratory:  Negative for cough, shortness of breath and wheezing.   Cardiovascular:  Negative for chest pain, palpitations and leg swelling.  Gastrointestinal:  Negative for abdominal pain, blood in stool, constipation, diarrhea and nausea.       No gerd  Genitourinary:  Negative for dysuria.  Musculoskeletal:  Positive for back pain (mild, usually in morning). Negative for arthralgias.  Skin:  Negative for color change and rash.  Neurological:  Negative for light-headedness and headaches.  Psychiatric/Behavioral:  Negative for dysphoric mood and sleep disturbance. The patient is nervous/anxious.       Objective:   Vitals:   11/13/20 1258  BP: 136/70  Pulse: (!) 59  Temp: 98.1 F (36.7 C)  SpO2: 99%   Filed Weights   11/13/20 1258  Weight: 132 lb (59.9 kg)   Body mass index is 23.38 kg/m.  BP Readings from Last 3 Encounters:  11/13/20 136/70  11/05/20 (!) 167/80  10/25/20 128/66    Wt Readings from Last 3 Encounters:  11/13/20 132 lb (59.9 kg)  11/05/20 133 lb 9.6 oz (60.6 kg)  10/25/20 133 lb (60.3 kg)     Physical Exam Constitutional: She appears well-developed and well-nourished. No distress.  HENT:  Head: Normocephalic and atraumatic.  Right  Ear: External ear normal. Normal ear canal and TM Left Ear: External ear normal.  Normal ear canal and TM Mouth/Throat: Oropharynx is clear and moist.  Eyes: Conjunctivae and EOM are normal.  Neck: Neck supple. No tracheal deviation present. No thyromegaly present.  No carotid bruit  Cardiovascular: Normal rate, regular rhythm and normal heart sounds.   No murmur heard.  No edema. Pulmonary/Chest: Effort normal and breath sounds normal. No respiratory distress. She has no wheezes. She has no rales.  Breast: deferred   Abdominal: Soft. She exhibits no distension. There is no tenderness.  Lymphadenopathy: She has no cervical adenopathy.  Skin: Skin is warm and dry. She is not diaphoretic.  Psychiatric: She has a normal mood and affect. Her behavior is normal.     Lab Results  Component Value Date   WBC 6.5 11/04/2019   HGB 13.3 11/04/2019   HCT 38.7 11/04/2019   PLT 314 11/04/2019   GLUCOSE 94 11/04/2019   CHOL 149 11/04/2019   TRIG 57 11/04/2019   HDL 69 11/04/2019   LDLCALC 67 11/04/2019   ALT 23 11/04/2019   AST 24 11/04/2019   NA 140 11/04/2019   K 4.4 11/04/2019   CL 105 11/04/2019   CREATININE 0.71 11/04/2019   BUN 15 11/04/2019   CO2 25 11/04/2019   TSH 2.05 11/04/2019   HGBA1C 4.0 11/04/2019         Assessment & Plan:   Physical exam: Screening blood work  ordered Exercise  lots of yard work, some walking Weight  normal Substance abuse  none Sees dermatology regularly  Flu vaccine today.  Reviewed recommended immunizations.   Health Maintenance  Topic Date Due   COLONOSCOPY (Pts 45-55yrs Insurance coverage will need to be confirmed)  05/24/2018   INFLUENZA VACCINE  09/24/2020   COVID-19 Vaccine (4 - Booster for Moderna series) 11/29/2020 (Originally 09/12/2020)   Zoster Vaccines- Shingrix (1 of 2) 02/04/2021 (Originally [birth year]20[birth year]1971)   DEXA SCAN  10/19/2028 (Originally 01/04/2017)   MAMMOGRAM  08/15/2022  TETANUS/TDAP  08/22/2026   Hepatitis C  Screening  Completed   HPV VACCINES  Aged Out          See Problem List for Assessment and Plan of chronic medical problems.

## 2020-11-13 ENCOUNTER — Ambulatory Visit (INDEPENDENT_AMBULATORY_CARE_PROVIDER_SITE_OTHER): Payer: Medicare HMO | Admitting: Internal Medicine

## 2020-11-13 ENCOUNTER — Other Ambulatory Visit: Payer: Self-pay

## 2020-11-13 ENCOUNTER — Encounter: Payer: Self-pay | Admitting: Internal Medicine

## 2020-11-13 VITALS — BP 136/70 | HR 59 | Temp 98.1°F | Ht 63.0 in | Wt 132.0 lb

## 2020-11-13 DIAGNOSIS — R61 Generalized hyperhidrosis: Secondary | ICD-10-CM | POA: Diagnosis not present

## 2020-11-13 DIAGNOSIS — R739 Hyperglycemia, unspecified: Secondary | ICD-10-CM

## 2020-11-13 DIAGNOSIS — F419 Anxiety disorder, unspecified: Secondary | ICD-10-CM | POA: Diagnosis not present

## 2020-11-13 DIAGNOSIS — R69 Illness, unspecified: Secondary | ICD-10-CM | POA: Diagnosis not present

## 2020-11-13 DIAGNOSIS — E782 Mixed hyperlipidemia: Secondary | ICD-10-CM | POA: Diagnosis not present

## 2020-11-13 DIAGNOSIS — I700 Atherosclerosis of aorta: Secondary | ICD-10-CM | POA: Diagnosis not present

## 2020-11-13 DIAGNOSIS — I2510 Atherosclerotic heart disease of native coronary artery without angina pectoris: Secondary | ICD-10-CM

## 2020-11-13 DIAGNOSIS — Z0000 Encounter for general adult medical examination without abnormal findings: Secondary | ICD-10-CM | POA: Diagnosis not present

## 2020-11-13 LAB — COMPREHENSIVE METABOLIC PANEL
ALT: 15 U/L (ref 0–35)
AST: 24 U/L (ref 0–37)
Albumin: 4.2 g/dL (ref 3.5–5.2)
Alkaline Phosphatase: 68 U/L (ref 39–117)
BUN: 12 mg/dL (ref 6–23)
CO2: 29 mEq/L (ref 19–32)
Calcium: 9.6 mg/dL (ref 8.4–10.5)
Chloride: 104 mEq/L (ref 96–112)
Creatinine, Ser: 0.65 mg/dL (ref 0.40–1.20)
GFR: 90.37 mL/min (ref >60.00)
Glucose, Bld: 91 mg/dL (ref 70–99)
Potassium: 4.3 mEq/L (ref 3.5–5.1)
Sodium: 141 mEq/L (ref 135–145)
Total Bilirubin: 1.4 mg/dL — ABNORMAL HIGH (ref 0.2–1.2)
Total Protein: 7.4 g/dL (ref 6.0–8.3)

## 2020-11-13 LAB — CBC WITH DIFFERENTIAL/PLATELET
Basophils Absolute: 0.1 10*3/uL (ref 0.0–0.1)
Basophils Relative: 1.9 % (ref 0.0–3.0)
Eosinophils Absolute: 0.3 10*3/uL (ref 0.0–0.7)
Eosinophils Relative: 4.8 % (ref 0.0–5.0)
HCT: 35.7 % — ABNORMAL LOW (ref 36.0–46.0)
Hemoglobin: 12.9 g/dL (ref 12.0–15.0)
Lymphocytes Relative: 20.3 % (ref 12.0–46.0)
Lymphs Abs: 1.2 10*3/uL (ref 0.7–4.0)
MCHC: 36 g/dL (ref 30.0–36.0)
MCV: 97.2 fl (ref 78.0–100.0)
Monocytes Absolute: 0.8 10*3/uL (ref 0.1–1.0)
Monocytes Relative: 13 % — ABNORMAL HIGH (ref 3.0–12.0)
Neutro Abs: 3.5 10*3/uL (ref 1.4–7.7)
Neutrophils Relative %: 60 % (ref 43.0–77.0)
Platelets: 272 10*3/uL (ref 150.0–400.0)
RBC: 3.67 Mil/uL — ABNORMAL LOW (ref 3.87–5.11)
RDW: 15.5 % (ref 11.5–15.5)
WBC: 5.8 10*3/uL (ref 4.0–10.5)

## 2020-11-13 LAB — HEMOGLOBIN A1C: Hgb A1c MFr Bld: 4 % — ABNORMAL LOW (ref 4.6–6.5)

## 2020-11-13 LAB — LIPID PANEL
Cholesterol: 144 mg/dL (ref 0–200)
HDL: 79.9 mg/dL (ref >39.00)
LDL Cholesterol: 55 mg/dL (ref 0–99)
NonHDL: 64.3
Total CHOL/HDL Ratio: 2
Triglycerides: 46 mg/dL (ref 0.0–149.0)
VLDL: 9.2 mg/dL (ref 0.0–40.0)

## 2020-11-13 LAB — TSH: TSH: 1.57 u[IU]/mL (ref 0.35–5.50)

## 2020-11-13 MED ORDER — CLONAZEPAM 0.5 MG PO TABS: 0.5000 mg | ORAL_TABLET | Freq: Every day | ORAL | 2 refills | Status: DC | PRN

## 2020-11-13 MED ORDER — ESCITALOPRAM OXALATE 10 MG PO TABS: 10.0000 mg | ORAL_TABLET | Freq: Every day | ORAL | 3 refills | Status: DC

## 2020-11-13 NOTE — Assessment & Plan Note (Signed)
Chronic Check lipid panel  Continue rosuvastatin 20 mg daily Regular exercise and healthy diet encouraged

## 2020-11-13 NOTE — Assessment & Plan Note (Addendum)
Chronic Following with cardiology Continue aspirin 81 mg daily, rosuvastatin 20 mg daily She is very active and exercising regularly CBC, TSH, CMP, lipids

## 2020-11-13 NOTE — Assessment & Plan Note (Signed)
Chronic Not ideally controlled-does have intermittent flares or episodes of anxiety Currently taking Lexapro 5 mg daily, which is a very low-dose-increase this to 10 mg daily and hopefully anxiety overall will be better controlled Continue clonazepam 0.5 mg daily as needed

## 2020-11-13 NOTE — Assessment & Plan Note (Signed)
Chronic Continue rosuvastatin 20 mg daily Continue regular exercise, healthy diet Check lipids

## 2020-11-13 NOTE — Assessment & Plan Note (Signed)
Acute Over the summer had 5 or 6 episodes of diaphoresis that was not necessarily associated with activity Not associated with any other activity Has had a hysterectomy Will check TSH, but expect back to be within normal limits ?  Related to Lexapro advised her to monitor that to see if the increase with the increased dose of Lexapro

## 2020-11-13 NOTE — Assessment & Plan Note (Signed)
Chronic Check a1c Low sugar / carb diet Stressed regular exercise  

## 2020-11-15 ENCOUNTER — Other Ambulatory Visit (HOSPITAL_COMMUNITY)
Admission: RE | Admit: 2020-11-15 | Discharge: 2020-11-15 | Disposition: A | Discharge status: Home / Self Care | Payer: Medicare HMO | Source: Ambulatory Visit | Attending: Obstetrics & Gynecology | Admitting: Obstetrics & Gynecology

## 2020-11-15 ENCOUNTER — Encounter (HOSPITAL_BASED_OUTPATIENT_CLINIC_OR_DEPARTMENT_OTHER): Payer: Self-pay | Admitting: Obstetrics & Gynecology

## 2020-11-15 ENCOUNTER — Other Ambulatory Visit: Payer: Self-pay

## 2020-11-15 ENCOUNTER — Ambulatory Visit (INDEPENDENT_AMBULATORY_CARE_PROVIDER_SITE_OTHER): Payer: Medicare HMO | Admitting: Obstetrics & Gynecology

## 2020-11-15 VITALS — BP 164/79 | HR 57 | Ht 63.0 in | Wt 134.0 lb

## 2020-11-15 DIAGNOSIS — R87811 Vaginal high risk human papillomavirus (HPV) DNA test positive: Secondary | ICD-10-CM

## 2020-11-15 DIAGNOSIS — N952 Postmenopausal atrophic vaginitis: Secondary | ICD-10-CM | POA: Diagnosis not present

## 2020-11-15 DIAGNOSIS — R69 Illness, unspecified: Secondary | ICD-10-CM | POA: Diagnosis not present

## 2020-11-15 NOTE — Progress Notes (Signed)
70 y.o. Widowed female here for vaginal colposcopy with possible biopsies and/or ECC due to +HR HPV Pap with possible atypia noted on most recent pap smear.  Pt did undergo hysterectomy last year due to persistent abnormal pap smear.  Denies vaginal bleeding.  She does have a lot of questions today.  These were addressed.      Patient's last menstrual period was 02/24/2002.          Sexually active: No.  The current method of family planning is status post hysterectomy.     Patient has been counseled about results and procedure.  Risks and benefits have bene reviewed including immediate and/or delayed bleeding, infection, cervical scaring from procedure, possibility of needing additional follow up as well as treatment.  Rare risks of missing a lesion discussed as well.  All questions answered.  Pt ready to proceed.  Consent obtained.  BP (!) 164/79 (BP Location: Left Arm, Patient Position: Sitting, Cuff Size: Small)   Pulse (!) 57   Ht 5\' 3"  (1.6 m) Comment: report  Wt 134 lb (60.8 kg)   LMP 02/24/2002   BMI 23.74 kg/m   General appearance: alert, cooperative and appears stated age Lymph nodes: No abnormal inguinal nodes palpated Neurologic: Grossly normal  Pelvic: External genitalia:  no lesions              Urethra:  normal appearing urethra with no masses, tenderness or lesions              Bartholins and Skenes: normal                 Vagina: normal appearing vagina with normal color and no discharge, no lesions               Physical Exam Constitutional:      Appearance: Normal appearance.  Genitourinary:    Labia:        Right: No rash, tenderness or lesion.        Left: No rash, tenderness or lesion.      Vagina: Normal.     Comments: Cervix and uterus surgically absent. Lymphadenopathy:     Lower Body: No right inguinal adenopathy. No left inguinal adenopathy.  Skin:    General: Skin is warm.  Neurological:     General: No focal deficit present.    Speculum placed.   3% acetic acid applied to vagina.  Apex visualized using both 7.5X and 15X magnification.  Green filter also used.  Lugols solution was used.  Findings:  small area of decreased staining noted just inferiorly of apex and to left.  Biopsy:  obtained.  Monsel's was not needed.  Excellent hemostasis was present.  Pt tolerated procedure well and all instruments were removed.   Chaperone, Octaviano Batty, was present during procedure.  Assessment/Plan: 1. Vaginal high risk HPV DNA test positive - Surgical pathology( [facility]/ [facility]) - Pathology results will be called to patient and follow-up planned pending results.

## 2020-11-19 LAB — SURGICAL PATHOLOGY

## 2020-11-21 ENCOUNTER — Encounter (HOSPITAL_BASED_OUTPATIENT_CLINIC_OR_DEPARTMENT_OTHER): Payer: Self-pay

## 2020-11-28 DIAGNOSIS — D2272 Melanocytic nevi of left lower limb, including hip: Secondary | ICD-10-CM | POA: Diagnosis not present

## 2020-11-28 DIAGNOSIS — Z85828 Personal history of other malignant neoplasm of skin: Secondary | ICD-10-CM | POA: Diagnosis not present

## 2020-11-28 DIAGNOSIS — D2271 Melanocytic nevi of right lower limb, including hip: Secondary | ICD-10-CM | POA: Diagnosis not present

## 2020-11-28 DIAGNOSIS — L821 Other seborrheic keratosis: Secondary | ICD-10-CM | POA: Diagnosis not present

## 2020-11-28 DIAGNOSIS — D2261 Melanocytic nevi of right upper limb, including shoulder: Secondary | ICD-10-CM | POA: Diagnosis not present

## 2020-11-28 DIAGNOSIS — L853 Xerosis cutis: Secondary | ICD-10-CM | POA: Diagnosis not present

## 2020-11-28 DIAGNOSIS — L738 Other specified follicular disorders: Secondary | ICD-10-CM | POA: Diagnosis not present

## 2020-11-28 DIAGNOSIS — D1801 Hemangioma of skin and subcutaneous tissue: Secondary | ICD-10-CM | POA: Diagnosis not present

## 2020-12-06 ENCOUNTER — Telehealth: Payer: Self-pay

## 2020-12-06 NOTE — Progress Notes (Signed)
Chronic Care Management Pharmacy Assistant   Name: CLAIRA JETER  MRN: [id number] DOB: [birth year]   Reason for Encounter: Disease State   Conditions to be addressed/monitored: HTN   Recent office visits:  11/13/20 Binnie Rail, MD-PCP (Annual Exam) labs ordered, med changes:escitalopram [facility]) 10 MG tablet  Recent consult visits:  11/05/20 Megan Salon, MD-Gynecology (GYN exam for high-risk patient) orders: Cytology - PAP, DG BONE DENSITY   10/25/20 Loel Dubonnet, NP-Cardiology (Coronary artery disease of native artery of native heart with stable angina pectoris) no med changes  Hospital visits:  None in previous 6 months  Medications: Outpatient Encounter Medications as of 12/06/2020  Medication Sig   ASPIRIN 81 PO Take 81 mg by mouth daily.   clonazePAM (KLONOPIN) 0.5 MG tablet Take 1 tablet (0.5 mg total) by mouth daily as needed for anxiety.   escitalopram (LEXAPRO) 10 MG tablet Take 1 tablet (10 mg total) by mouth daily.   halobetasol (ULTRAVATE) 0.05 % cream Apply 1 application topically daily as needed (irritation).    losartan (COZAAR) 25 MG tablet TAKE 1 TABLET BY MOUTH EVERY DAY   Omega-3 Fatty Acids (FISH OIL PO) Take by mouth.   rosuvastatin (CRESTOR) 20 MG tablet Take 1 tablet (20 mg total) by mouth daily.   No facility-administered encounter medications on file as of 12/06/2020.   Reviewed chart prior to disease state call. Spoke with patient regarding BP  Recent Office Vitals: BP Readings from Last 3 Encounters:  11/15/20 (!) 164/79  11/13/20 136/70  11/05/20 (!) 167/80   Pulse Readings from Last 3 Encounters:  11/15/20 (!) 57  11/13/20 (!) 59  11/05/20 63    Wt Readings from Last 3 Encounters:  11/15/20 134 lb (60.8 kg)  11/13/20 132 lb (59.9 kg)  11/05/20 133 lb 9.6 oz (60.6 kg)     Kidney Function Lab Results  Component Value Date/Time   CREATININE 0.65 11/13/2020 01:44 PM   CREATININE 0.71 11/04/2019 09:55 AM   CREATININE  0.85 10/04/2019 02:36 PM   CREATININE 0.76 04/28/2014 11:24 AM   GFR 90.37 11/13/2020 01:44 PM   GFRNONAA >60 10/04/2019 02:36 PM   GFRAA >60 10/04/2019 02:36 PM    BMP Latest Ref Rng & Units 11/13/2020 11/04/2019 10/04/2019  Glucose 70 - 99 mg/dL 91 94 98  BUN 6 - 23 mg/dL 12 15 12   Creatinine 0.40 - 1.20 mg/dL 0.65 0.71 0.85  BUN/Creat Ratio 6 - 22 (calc) - NOT APPLICABLE -  Sodium 967 - 145 mEq/L 141 140 142  Potassium 3.5 - 5.1 mEq/L 4.3 4.4 4.9  Chloride 96 - 112 mEq/L 104 105 105  CO2 19 - 32 mEq/L 29 25 28   Calcium 8.4 - 10.5 mg/dL 9.6 9.7 9.8   Reviewed chart for medication changes and drug therapy problems ahead of medication adherence call.  Attempted to contact patient x 3 for medication review and health check, unable to reach patient, left voicemails to return call.    Current antihypertensive regimen:  Losartan 25 mg 1 tab daily  Any recent hospitalizations or ED visits since last visit with CPP? No   Adherence Review: Is the patient currently on ACE/ARB medication? Yes Does the patient have >5 day gap between last estimated fill dates? No    Care Gaps: Colonoscopy-05/23/13 Diabetic Foot Exam- Mammogram-08/14/20 Ophthalmology-NA Dexa Scan -11/05/20  Annual Well Visit - 11/13/20 Micro albumin-NA Hemoglobin A1c-11/05/20  Star Rating Drugs: Losartan 25 mg-last fill 11/29/20 90 ds  Rosuvastatin 20  mg-last fill 11/29/20 90 ds  Ethelene Hal Clinical Pharmacist Assistant [phone]

## 2020-12-21 ENCOUNTER — Telehealth: Payer: Self-pay | Admitting: Internal Medicine

## 2020-12-21 MED ORDER — METHOCARBAMOL 500 MG PO TABS: 500.0000 mg | ORAL_TABLET | Freq: Four times a day (QID) | ORAL | 0 refills | Status: DC

## 2020-12-21 NOTE — Telephone Encounter (Signed)
Sent to pharmacy  - if not better needs to be seen

## 2020-12-21 NOTE — Telephone Encounter (Signed)
Patient says she has pulled a muscle in her back & would like provider to send a muscle relaxer to the pharmacy if possible  Pharmacy: [id number] - [place], [place]. AT [address]  Phone:  [phone] Fax:  [phone]

## 2020-12-21 NOTE — Telephone Encounter (Signed)
Spoke with patient today. 

## 2020-12-21 NOTE — Telephone Encounter (Signed)
Patient calling in again   Says she is in a lot of pain & is having trouble moving around  Please advise (see message below)

## 2021-01-28 ENCOUNTER — Encounter (HOSPITAL_BASED_OUTPATIENT_CLINIC_OR_DEPARTMENT_OTHER): Payer: Self-pay | Admitting: Obstetrics & Gynecology

## 2021-01-30 ENCOUNTER — Other Ambulatory Visit (HOSPITAL_BASED_OUTPATIENT_CLINIC_OR_DEPARTMENT_OTHER): Payer: Self-pay | Admitting: Obstetrics & Gynecology

## 2021-02-01 ENCOUNTER — Other Ambulatory Visit (HOSPITAL_BASED_OUTPATIENT_CLINIC_OR_DEPARTMENT_OTHER): Payer: Self-pay | Admitting: Obstetrics & Gynecology

## 2021-02-01 MED ORDER — NONFORMULARY OR COMPOUNDED ITEM: 3 refills | Status: DC

## 2021-02-01 NOTE — Progress Notes (Signed)
Vit E rx sent to pharmacy on file.

## 2021-03-15 ENCOUNTER — Ambulatory Visit: Payer: Medicare HMO

## 2021-03-15 IMAGING — CT CT HEART MORP W/ CTA COR W/ SCORE W/ CA W/CM &/OR W/O CM
4 of 7 series · 8 of 20 positions shown, 9 images · non-contrast
Comparison: None.
COMPARISON: None.
COMPARISON: None.

Addendum:
EXAM:
OVER-READ INTERPRETATION  CT CHEST

The following report is an over-read performed by radiologist Dr.
Visa-Valtteri Loginov [REDACTED] on 01/05/2019. This
over-read does not include interpretation of cardiac or coronary
anatomy or pathology. The coronary calcium score/coronary CTA
interpretation by the cardiologist is attached.
CLINICAL DATA: 68-year-old female with h/o HTN, HLP and dyspnea on
exertion.
Cardiac/Coronary  CTA
TECHNIQUE: The patient was scanned on a [employer] Force scanner.

[Series 6: best diast 68 % · axial · 0.39mm/px · z∈[+1112,+1162]mm · 2 of 372 slices shown, 3 images]
[im 124/372  vessel]
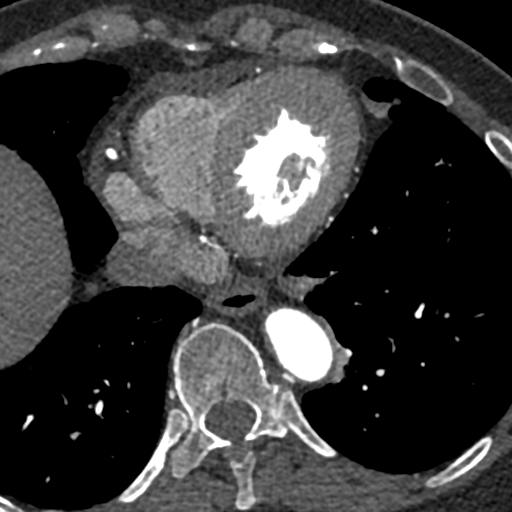
[im 124/372  lung]
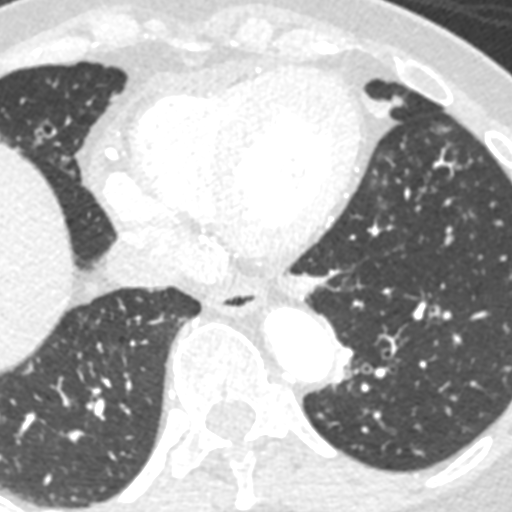
[im 248/372  vessel]
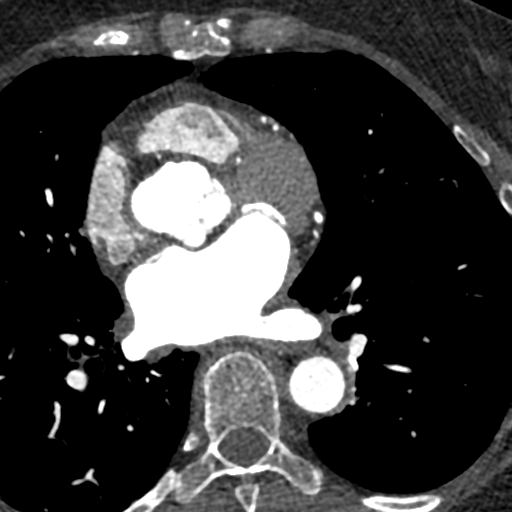

[Series 7: best syst 68 % · axial · 0.39mm/px · z∈[+1112,+1162]mm · 2 of 372 slices shown]
[im 124/372  vessel]
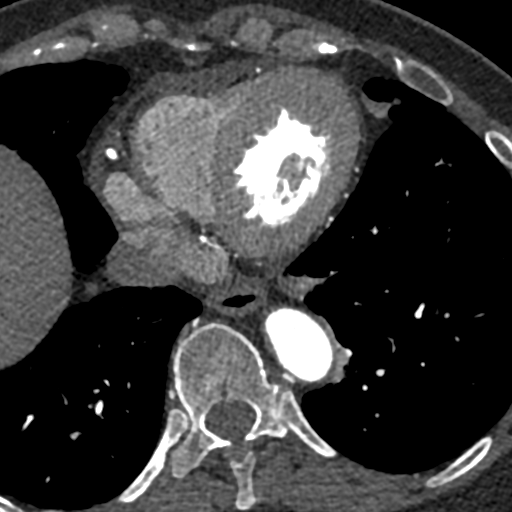
[im 248/372  vessel]
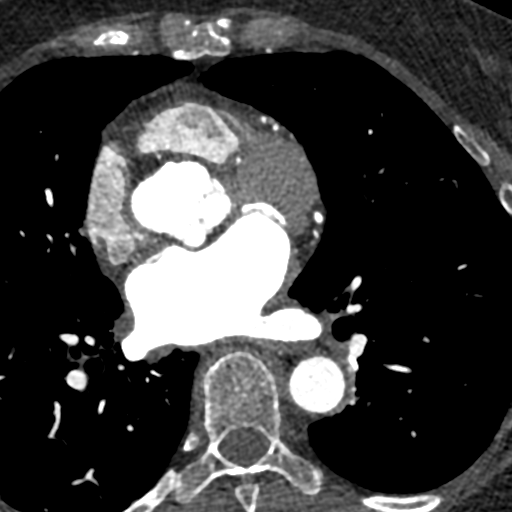

[Series 8: ts diast sharp 68 % · axial · 0.39mm/px · z∈[+1112,+1162]mm · 2 of 372 slices shown]
[im 124/372  lung]
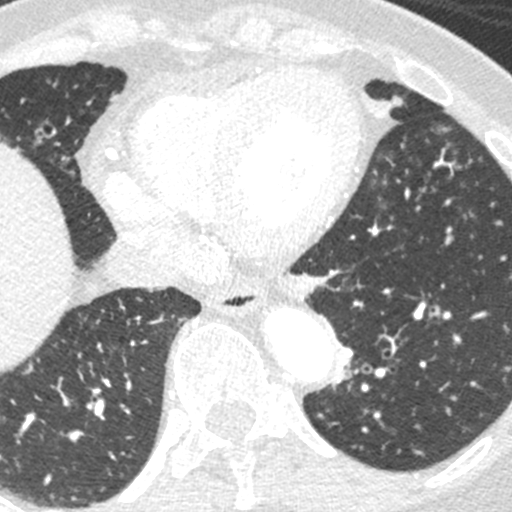
[im 248/372  lung]
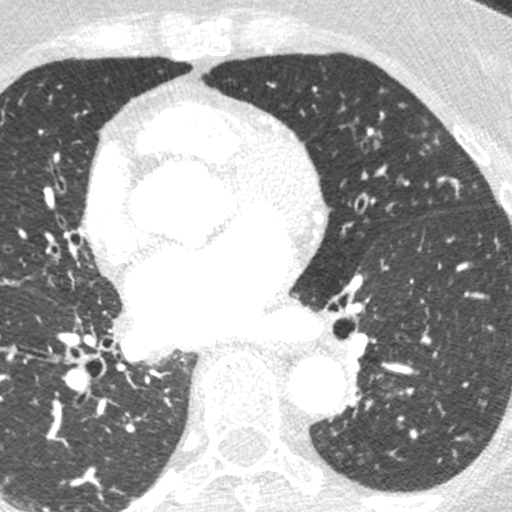

[Series 9: ts syst sharp 68 % · axial · 0.39mm/px · z∈[+1112,+1162]mm · 2 of 372 slices shown]
[im 124/372  lung]
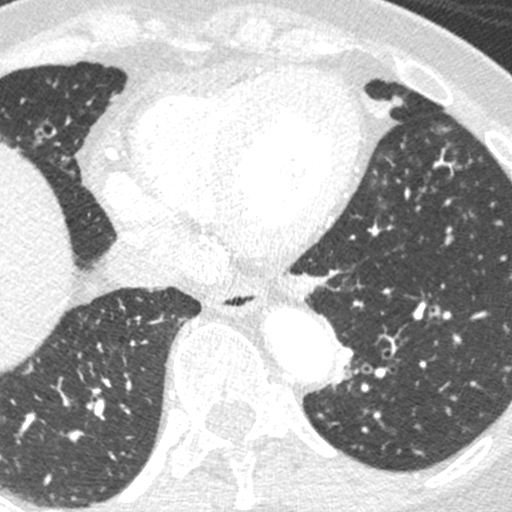
[im 248/372  lung]
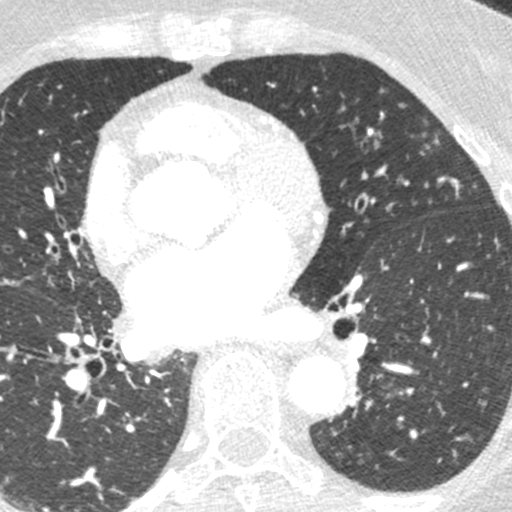

[8 of 20 positions shown; findings below may reference images not displayed]

FINDINGS: Aortic atherosclerosis. A few scattered calcified granulomas are
noted. Within the visualized portions of the thorax there are no
suspicious appearing pulmonary nodules or masses, there is no acute
consolidative airspace disease, no pleural effusions, no
pneumothorax and no lymphadenopathy. There are widespread areas of
cylindrical and mild varicose bronchiectasis with associated
thickening of the peribronchovascular interstitium, regional areas
of architectural distortion and peribronchovascular micro nodularity
most compatible with areas of chronic mucoid impaction within
terminal bronchioles. Visualized portions of the upper abdomen are
unremarkable. There are no aggressive appearing lytic or blastic
lesions noted in the visualized portions of the skeleton.
IMPRESSION: 1. The appearance of the lungs suggests chronic indolent atypical
infectious process such as mycobacterium avium intracellulare (JEMISON).
Outpatient referral to Pulmonology for further evaluation is
suggested.
2. Aortic atherosclerosis.
FINDINGS: A 100 kV prospective scan was triggered in the descending thoracic
aorta at 111 HU's. Axial non-contrast 3 mm slices were carried out
through the heart. The data set was analyzed on a dedicated work
station and scored using the Agatson method. Gantry rotation speed
was 250 msecs and collimation was .6 mm. No beta blockade and 0.8 mg
of sl NTG was given. The 3D data set was reconstructed in 5%
intervals of the 67-82 % of the R-R cycle. Diastolic phases were
analyzed on a dedicated work station using MPR, MIP and VRT modes.
The patient received 80 cc of contrast.

Aorta: Normal size. Mild diffuse atherosclerotic plaque and
calcifications. No dissection.

Aortic Valve:  Trileaflet.  Mild calcifications.

Coronary Arteries:  Normal coronary origin.  Right dominance.

RCA is a large dominant artery that gives rise to PDA and PLA. There
is minimal calcified plaque in the ostium with stenosis 0-25%.

Left main is a large artery that gives rise to LAD and LCX arteries.
Left main has minimal ostial plaque.

LAD is a large vessel that gives rise to one diagonal artery. LAD
has diffuse moderate to severe calcified plaque in the proximal to
mid portion, there are two moderate plaques with stenosis 50-69%
followed by a focal stenosis that is possibly severe. Mid to distal
LAD has minimal plaque.

D1 has minimal plaque.

LCX is a non-dominant artery that gives rise to one large OM1
branch. There is minimal plaque.

Other findings:

Normal pulmonary vein drainage into the left atrium.

Normal left atrial appendage without a thrombus.

Normal size of the pulmonary artery.
IMPRESSION: 1. Coronary calcium score of 544. This was 96 percentile for age and
sex matched control.

2. Normal coronary origin with right dominance.

3. Diffuse moderate to severe calcified plaque in the proximal to
mid LAD with possibly severe stenosis in the mid LAD. Additional
analysis with CT FFR will be submitted.

EXAM:
CT FFR ANALYSIS
FINDINGS: FFRct analysis was performed on the original cardiac CT angiogram
dataset. Diagrammatic representation of the FFRct analysis is
provided in a separate PDF document in PACS. This dictation was
created using the PDF document and an interactive 3D model of the
results. 3D model is not available in the EMR/PACS. Normal FFR range
is >0.80.

1. Left Main:  No significant stenosis.

2. LAD: Proximal: 0.94, mid: 0.92, distal: 0.82.
3. LCX: No significant stenosis.
4. RCA: No significant stenosis.
IMPRESSION: 1. CT FFR analysis didn't show any significant stenosis. Aggressive
medical management is recommended.

*** End of Addendum ***
Addendum:
EXAM:
OVER-READ INTERPRETATION  CT CHEST

The following report is an over-read performed by radiologist Dr.
Visa-Valtteri Loginov [REDACTED] on 01/05/2019. This
over-read does not include interpretation of cardiac or coronary
anatomy or pathology. The coronary calcium score/coronary CTA
interpretation by the cardiologist is attached.
FINDINGS: Aortic atherosclerosis. A few scattered calcified granulomas are
noted. Within the visualized portions of the thorax there are no
suspicious appearing pulmonary nodules or masses, there is no acute
consolidative airspace disease, no pleural effusions, no
pneumothorax and no lymphadenopathy. There are widespread areas of
cylindrical and mild varicose bronchiectasis with associated
thickening of the peribronchovascular interstitium, regional areas
of architectural distortion and peribronchovascular micro nodularity
most compatible with areas of chronic mucoid impaction within
terminal bronchioles. Visualized portions of the upper abdomen are
unremarkable. There are no aggressive appearing lytic or blastic
lesions noted in the visualized portions of the skeleton.
IMPRESSION: 1. The appearance of the lungs suggests chronic indolent atypical
infectious process such as mycobacterium avium intracellulare (JEMISON).
Outpatient referral to Pulmonology for further evaluation is
suggested.
2. Aortic atherosclerosis.
FINDINGS: A 100 kV prospective scan was triggered in the descending thoracic
aorta at 111 HU's. Axial non-contrast 3 mm slices were carried out
through the heart. The data set was analyzed on a dedicated work
station and scored using the Agatson method. Gantry rotation speed
was 250 msecs and collimation was .6 mm. No beta blockade and 0.8 mg
of sl NTG was given. The 3D data set was reconstructed in 5%
intervals of the 67-82 % of the R-R cycle. Diastolic phases were
analyzed on a dedicated work station using MPR, MIP and VRT modes.
The patient received 80 cc of contrast.

Aorta: Normal size. Mild diffuse atherosclerotic plaque and
calcifications. No dissection.

Aortic Valve:  Trileaflet.  Mild calcifications.

Coronary Arteries:  Normal coronary origin.  Right dominance.

RCA is a large dominant artery that gives rise to PDA and PLA. There
is minimal calcified plaque in the ostium with stenosis 0-25%.

Left main is a large artery that gives rise to LAD and LCX arteries.
Left main has minimal ostial plaque.

LAD is a large vessel that gives rise to one diagonal artery. LAD
has diffuse moderate to severe calcified plaque in the proximal to
mid portion, there are two moderate plaques with stenosis 50-69%
followed by a focal stenosis that is possibly severe. Mid to distal
LAD has minimal plaque.

D1 has minimal plaque.

LCX is a non-dominant artery that gives rise to one large OM1
branch. There is minimal plaque.

Other findings:

Normal pulmonary vein drainage into the left atrium.

Normal left atrial appendage without a thrombus.

Normal size of the pulmonary artery.
IMPRESSION: 1. Coronary calcium score of 544. This was 96 percentile for age and
sex matched control.

2. Normal coronary origin with right dominance.

3. Diffuse moderate to severe calcified plaque in the proximal to
mid LAD with possibly severe stenosis in the mid LAD. Additional
analysis with CT FFR will be submitted.

*** End of Addendum ***
EXAM:
OVER-READ INTERPRETATION  CT CHEST

The following report is an over-read performed by radiologist Dr.
Visa-Valtteri Loginov [REDACTED] on 01/05/2019. This
over-read does not include interpretation of cardiac or coronary
anatomy or pathology. The coronary calcium score/coronary CTA
interpretation by the cardiologist is attached.
FINDINGS: Aortic atherosclerosis. A few scattered calcified granulomas are
noted. Within the visualized portions of the thorax there are no
suspicious appearing pulmonary nodules or masses, there is no acute
consolidative airspace disease, no pleural effusions, no
pneumothorax and no lymphadenopathy. There are widespread areas of
cylindrical and mild varicose bronchiectasis with associated
thickening of the peribronchovascular interstitium, regional areas
of architectural distortion and peribronchovascular micro nodularity
most compatible with areas of chronic mucoid impaction within
terminal bronchioles. Visualized portions of the upper abdomen are
unremarkable. There are no aggressive appearing lytic or blastic
lesions noted in the visualized portions of the skeleton.
IMPRESSION: 1. The appearance of the lungs suggests chronic indolent atypical
infectious process such as mycobacterium avium intracellulare (JEMISON).
Outpatient referral to Pulmonology for further evaluation is
suggested.
2. Aortic atherosclerosis.

## 2021-03-25 ENCOUNTER — Ambulatory Visit: Payer: Medicare HMO

## 2021-03-25 ENCOUNTER — Other Ambulatory Visit: Payer: Self-pay

## 2021-04-01 ENCOUNTER — Other Ambulatory Visit: Payer: Self-pay

## 2021-04-01 ENCOUNTER — Ambulatory Visit (INDEPENDENT_AMBULATORY_CARE_PROVIDER_SITE_OTHER): Payer: Medicare HMO

## 2021-04-01 DIAGNOSIS — Z0000 Encounter for general adult medical examination without abnormal findings: Secondary | ICD-10-CM

## 2021-04-01 NOTE — Patient Instructions (Signed)
Alicia Wilkerson , Thank you for taking time to come for your Medicare Wellness Visit. I appreciate your ongoing commitment to your health goals. Please review the following plan we discussed and let me know if I can assist you in the future.   Screening recommendations/referrals: Colonoscopy: 05/23/2013; due every 5 years (per patient: scheduled for 04/2021) Mammogram: 08/14/2020; due every 1-2 years  Bone Density: 01/05/2015; due every 2 years (per patient scheduled for 07/2021) Recommended yearly ophthalmology/optometry visit for glaucoma screening and checkup Recommended yearly dental visit for hygiene and checkup  Vaccinations: Influenza vaccine: 11/13/2020 Pneumococcal vaccine: 04/26/2018, 11/04/2019 Tdap vaccine: 08/21/2016; due every 10 years (due 08/22/2026) Shingles vaccine: 03/18/2021;  next dose due 05/13/2021   Covid-19: 03/26/2019, 04/25/2019, 06/20/2020, 12/04/2020  Advanced directives: Please bring a copy of your health care power of attorney and living will to the office at your convenience.  Conditions/risks identified: Yes; Client understands the importance of follow-up with providers by attending scheduled visits and discussed goals to eat healthier, increase physical activity, exercise the brain, socialize more, get enough sleep and make time for laughter.  Next appointment: Please schedule your next Medicare Wellness Visit with your Nurse Health Advisor in 1 year by calling [phone].   Preventive Care 69 Years and Older, Female Preventive care refers to lifestyle choices and visits with your health care provider that can promote health and wellness. What does preventive care include? A yearly physical exam. This is also called an annual well check. Dental exams once or twice a year. Routine eye exams. Ask your health care provider how often you should have your eyes checked. Personal lifestyle choices, including: Daily care of your teeth and gums. Regular physical  activity. Eating a healthy diet. Avoiding tobacco and drug use. Limiting alcohol use. Practicing safe sex. Taking low-dose aspirin every day. Taking vitamin and mineral supplements as recommended by your health care provider. What happens during an annual well check? The services and screenings done by your health care provider during your annual well check will depend on your age, overall health, lifestyle risk factors, and family history of disease. Counseling  Your health care provider may ask you questions about your: Alcohol use. Tobacco use. Drug use. Emotional well-being. Home and relationship well-being. Sexual activity. Eating habits. History of falls. Memory and ability to understand (cognition). Work and work Statistician. Reproductive health. Screening  You may have the following tests or measurements: Height, weight, and BMI. Blood pressure. Lipid and cholesterol levels. These may be checked every 5 years, or more frequently if you are over 42 years old. Skin check. Lung cancer screening. You may have this screening every year starting at age 50 if you have a 30-pack-year history of smoking and currently smoke or have quit within the past 15 years. Fecal occult blood test (FOBT) of the stool. You may have this test every year starting at age 69. Flexible sigmoidoscopy or colonoscopy. You may have a sigmoidoscopy every 5 years or a colonoscopy every 10 years starting at age 38. Hepatitis C blood test. Hepatitis B blood test. Sexually transmitted disease (STD) testing. Diabetes screening. This is done by checking your blood sugar (glucose) after you have not eaten for a while (fasting). You may have this done every 1-3 years. Bone density scan. This is done to screen for osteoporosis. You may have this done starting at age 69. Mammogram. This may be done every 1-2 years. Talk to your health care provider about how often you should have regular mammograms. Talk  with your  health care provider about your test results, treatment options, and if necessary, the need for more tests. Vaccines  Your health care provider may recommend certain vaccines, such as: Influenza vaccine. This is recommended every year. Tetanus, diphtheria, and acellular pertussis (Tdap, Td) vaccine. You may need a Td booster every 10 years. Zoster vaccine. You may need this after age 69. Pneumococcal 13-valent conjugate (PCV13) vaccine. One dose is recommended after age 69. Pneumococcal polysaccharide (PPSV23) vaccine. One dose is recommended after age 69. Talk to your health care provider about which screenings and vaccines you need and how often you need them. This information is not intended to replace advice given to you by your health care provider. Make sure you discuss any questions you have with your health care provider. Document Released: 03/09/2015 Document Revised: 10/31/2015 Document Reviewed: 12/12/2014 Elsevier Interactive Patient Education  2017 [place] Prevention in the Home Falls can cause injuries. They can happen to people of all ages. There are many things you can do to make your home safe and to help prevent falls. What can I do on the outside of my home? Regularly fix the edges of walkways and driveways and fix any cracks. Remove anything that might make you trip as you walk through a door, such as a raised step or threshold. Trim any bushes or trees on the path to your home. Use bright outdoor lighting. Clear any walking paths of anything that might make someone trip, such as rocks or tools. Regularly check to see if handrails are loose or broken. Make sure that both sides of any steps have handrails. Any raised decks and porches should have guardrails on the edges. Have any leaves, snow, or ice cleared regularly. Use sand or salt on walking paths during winter. Clean up any spills in your garage right away. This includes oil or grease spills. What can I  do in the bathroom? Use night lights. Install grab bars by the toilet and in the tub and shower. Do not use towel bars as grab bars. Use non-skid mats or decals in the tub or shower. If you need to sit down in the shower, use a plastic, non-slip stool. Keep the floor dry. Clean up any water that spills on the floor as soon as it happens. Remove soap buildup in the tub or shower regularly. Attach bath mats securely with double-sided non-slip rug tape. Do not have throw rugs and other things on the floor that can make you trip. What can I do in the bedroom? Use night lights. Make sure that you have a light by your bed that is easy to reach. Do not use any sheets or blankets that are too big for your bed. They should not hang down onto the floor. Have a firm chair that has side arms. You can use this for support while you get dressed. Do not have throw rugs and other things on the floor that can make you trip. What can I do in the kitchen? Clean up any spills right away. Avoid walking on wet floors. Keep items that you use a lot in easy-to-reach places. If you need to reach something above you, use a strong step stool that has a grab bar. Keep electrical cords out of the way. Do not use floor polish or wax that makes floors slippery. If you must use wax, use non-skid floor wax. Do not have throw rugs and other things on the floor that can make you  trip. What can I do with my stairs? Do not leave any items on the stairs. Make sure that there are handrails on both sides of the stairs and use them. Fix handrails that are broken or loose. Make sure that handrails are as long as the stairways. Check any carpeting to make sure that it is firmly attached to the stairs. Fix any carpet that is loose or worn. Avoid having throw rugs at the top or bottom of the stairs. If you do have throw rugs, attach them to the floor with carpet tape. Make sure that you have a light switch at the top of the stairs  and the bottom of the stairs. If you do not have them, ask someone to add them for you. What else can I do to help prevent falls? Wear shoes that: Do not have high heels. Have rubber bottoms. Are comfortable and fit you well. Are closed at the toe. Do not wear sandals. If you use a stepladder: Make sure that it is fully opened. Do not climb a closed stepladder. Make sure that both sides of the stepladder are locked into place. Ask someone to hold it for you, if possible. Clearly mark and make sure that you can see: Any grab bars or handrails. First and last steps. Where the edge of each step is. Use tools that help you move around (mobility aids) if they are needed. These include: Canes. Walkers. Scooters. Crutches. Turn on the lights when you go into a dark area. Replace any light bulbs as soon as they burn out. Set up your furniture so you have a clear path. Avoid moving your furniture around. If any of your floors are uneven, fix them. If there are any pets around you, be aware of where they are. Review your medicines with your doctor. Some medicines can make you feel dizzy. This can increase your chance of falling. Ask your doctor what other things that you can do to help prevent falls. This information is not intended to replace advice given to you by your health care provider. Make sure you discuss any questions you have with your health care provider. Document Released: 12/07/2008 Document Revised: 07/19/2015 Document Reviewed: 03/17/2014 Elsevier Interactive Patient Education  2017 [employer].

## 2021-04-01 NOTE — Progress Notes (Signed)
I connected with Alicia Wilkerson today by telephone and verified that I am speaking with the correct person using two identifiers. Location patient: home Location provider: work Persons participating in the virtual visit: patient, provider.   I discussed the limitations, risks, security and privacy concerns of performing an evaluation and management service by telephone and the availability of in person appointments. I also discussed with the patient that there may be a patient responsible charge related to this service. The patient expressed understanding and verbally consented to this telephonic visit.    Interactive audio and video telecommunications were attempted between this provider and patient, however failed, due to patient having technical difficulties OR patient did not have access to video capability.  We continued and completed visit with audio only.  Some vital signs may be absent or patient reported.   Time Spent with patient on telephone encounter: 40 minutes  Subjective:   Alicia Wilkerson is a 71 y.o. female who presents for Medicare Annual (Subsequent) preventive examination.  Review of Systems     Cardiac Risk Factors include: advanced age (>72men, >80 women);dyslipidemia;family history of premature cardiovascular disease;hypertension     Objective:    There were no vitals filed for this visit. There is no height or weight on file to calculate BMI.  Advanced Directives 04/01/2021 11/18/2019 10/18/2019 10/04/2019 12/03/2018  Does Patient Have a Medical Advance Directive? Yes Yes Yes Yes Yes  Type of Advance Directive Living will;Healthcare Power of [place];Living will [place];Living will [place];Living will [place];Living will  Does patient want to make changes to medical advance directive? No - Patient declined - - - -  Copy of [place] in Chart? No - copy  requested No - copy requested No - copy requested - No - copy requested    Current Medications (verified) Outpatient Encounter Medications as of 04/01/2021  Medication Sig   ASPIRIN 81 PO Take 81 mg by mouth daily.   clonazePAM (KLONOPIN) 0.5 MG tablet Take 1 tablet (0.5 mg total) by mouth daily as needed for anxiety.   escitalopram (LEXAPRO) 10 MG tablet Take 1 tablet (10 mg total) by mouth daily.   halobetasol (ULTRAVATE) 0.05 % cream Apply 1 application topically daily as needed (irritation).    losartan (COZAAR) 25 MG tablet TAKE 1 TABLET BY MOUTH EVERY DAY   methocarbamol (ROBAXIN) 500 MG tablet Take 1 tablet (500 mg total) by mouth 4 (four) times daily.   NONFORMULARY OR COMPOUNDED ITEM Vitamin E vaginal suppositories 200u/ml.  One pv three times weekly.   Omega-3 Fatty Acids (FISH OIL PO) Take by mouth.   rosuvastatin (CRESTOR) 20 MG tablet Take 1 tablet (20 mg total) by mouth daily.   No facility-administered encounter medications on file as of 04/01/2021.    Allergies (verified) Penicillins, Sulfa antibiotics, and Penicillin g   History: Past Medical History:  Diagnosis Date   Anemia    Anxiety disorder    Basal cell carcinoma (BCC) of left upper arm 12/2015   Basal cell carcinoma (BCC) of right lower leg 04/2016   Borderline hypertension    CAD (coronary artery disease)    a. nonobst by cor CT 12/2018.   CIN II (cervical intraepithelial neoplasia II)    CIN  1   Depression    Eczema    Family history of premature CAD    History of basal cell carcinoma (BCC) excision    left upper  arm 2017;   right lower leg 2018;  bilateral lower leg and hip area 2019;   inner right lower leg, outer right thigh, & upper outside left arm 09/ 2020   History of cervical dysplasia 2013   History of hereditary spherocytosis    s/p splenectomy in 1954   History of hyperparathyroidism    per pt yrs ago was put on mega dose of vit d which caused the hyperparathyroid resolved after stopping  vit d   History of pelvic fracture 2013   MAI (mycobacterium avium-intracellulare) ([place])    ? possibly by CT 12/2018   Mild hyperlipidemia    Osteoporosis    PONV (postoperative nausea and vomiting)    S/P splenectomy    spherocytosis   Wears glasses    White coat syndrome without diagnosis of hypertension    Past Surgical History:  Procedure Laterality Date   ANKLE SURGERY  03/14/2015    CERVICAL CONIZATION W/BX N/A 12/13/2018   Procedure: CONIZATION CERVIX WITH BIOPSY;  Surgeon: Megan Salon, MD;  Location: [place];  Service: Gynecology;  Laterality: N/A;   CYSTOSCOPY N/A 10/18/2019   Procedure: CYSTOSCOPY;  Surgeon: Megan Salon, MD;  Location: [facility];  Service: Gynecology;  Laterality: N/A;   IR RADIOLOGY PERIPHERAL GUIDED IV START  01/05/2019   IR US GUIDE VASC ACCESS RIGHT  01/05/2019   KNEE SURGERY Left 1991   per pt retained hardward   LEEP N/A 12/13/2018   Procedure: possible LOOP ELECTROSURGICAL EXCISION PROCEDURE (LEEP);  Surgeon: Megan Salon, MD;  Location: [place];  Service: Gynecology;  Laterality: N/A;   ORIF ANKLE FRACTURE Left 03/14/2015   per pt retained hardware   SPLENECTOMY, TOTAL  1954   due to spherocytosis   TOTAL LAPAROSCOPIC HYSTERECTOMY WITH SALPINGECTOMY N/A 10/18/2019   Procedure: TOTAL LAPAROSCOPIC HYSTERECTOMY WITH BILATERAL SALPINGECTOMY AND BILATERAL OOPHORECTOMY;  Surgeon: Megan Salon, MD;  Location: [place];  Service: Gynecology;  Laterality: N/A;  BILATERAL SALPINGECTOMY POSS OOPHORECTOMY   TUBAL LIGATION Bilateral 1995   Family History  Problem Relation Age of Onset   Heart failure Father    Skin cancer Father    Hypertension Father    Heart disease Father    Osteoporosis Mother    Colon cancer Mother        deceased   Heart disease Sister    Crohn's disease Daughter    Social History   Socioeconomic History   Marital status: Widowed    Spouse name: Not on file   Number of children: Not on  file   Years of education: Not on file   Highest education level: Not on file  Occupational History   Not on file  Tobacco Use   Smoking status: Never   Smokeless tobacco: Never  Vaping Use   Vaping Use: Never used  Substance and Sexual Activity   Alcohol use: Not Currently   Drug use: No   Sexual activity: Not Currently    Partners: Male    Birth control/protection: Surgical, Post-menopausal    Comment: BTL & hysterectomy  Other Topics Concern   Not on file  Social History Narrative   Not on file   Social Determinants of Health   Financial Resource Strain: Low Risk    Difficulty of Paying Living Expenses: Not hard at all  Food Insecurity: No Food Insecurity   Worried About [place] in the Last Year: Never true   Ran Out of  Food in the Last Year: Never true  Transportation Needs: No Transportation Needs   Lack of Transportation (Medical): No   Lack of Transportation (Non-Medical): No  Physical Activity: Sufficiently Active   Days of Exercise per Week: 4 days   Minutes of Exercise per Session: 50 min  Stress: No Stress Concern Present   Feeling of Stress : Not at all  Social Connections: Socially Isolated   Frequency of Communication with Friends and Family: More than three times a week   Frequency of Social Gatherings with Friends and Family: Never   Attends Religious Services: Never   Marine scientist or Organizations: No   Attends Archivist Meetings: Never   Marital Status: Widowed    Tobacco Counseling Counseling given: Not Answered   Clinical Intake:  Pre-visit preparation completed: No  Pain : No/denies pain     Nutritional Risks: None Diabetes: No  How often do you need to have someone help you when you read instructions, pamphlets, or other written materials from your doctor or pharmacy?: 1 - Never What is the last grade level you completed in school?: Bachelors Degree  Diabetic? no  Interpreter Needed?:  No  Information entered by :: Lisette Abu, LPN   Activities of Daily Living In your present state of health, do you have any difficulty performing the following activities: 04/01/2021  Hearing? N  Vision? N  Difficulty concentrating or making decisions? N  Walking or climbing stairs? N  Dressing or bathing? N  Doing errands, shopping? N  Preparing Food and eating ? N  Using the Toilet? N  In the past six months, have you accidently leaked urine? Y  Do you have problems with loss of bowel control? Y  Managing your Medications? N  Managing your Finances? N  Housekeeping or managing your Housekeeping? N  Some recent data might be hidden    Patient Care Team: Binnie Rail, MD as PCP - General (Internal Medicine) Freada Bergeron, MD as PCP - Cardiology (Cardiology) Charlton Haws, [facility] as Pharmacist (Pharmacist) Megan Salon, MD as Consulting Physician (Gynecology) Laurence Aly, OD (Optometry) Luberta Mutter, MD as Consulting Physician (Ophthalmology)  Indicate any recent Medical Services you may have received from other than [facility] providers in the past year (date may be approximate).     Assessment:   This is a routine wellness examination for Alicia Wilkerson.  Hearing/Vision screen No results found.  Dietary issues and exercise activities discussed: Current Exercise Habits: Home exercise routine, Type of exercise: walking, Time (Minutes): 40, Frequency (Times/Week): 4, Weekly Exercise (Minutes/Week): 160, Intensity: Moderate, Exercise limited by: None identified   Goals Addressed               This Visit's Progress     Patient Stated (pt-stated)        My goal is to take care of my health and close some of the gaps in my healthcare like getting my colonoscopy, mammogram and bone density.      Depression Screen PHQ 2/9 Scores 04/01/2021 11/15/2020 11/05/2020 11/18/2019 04/26/2018 10/13/2017 09/01/2016  PHQ - 2 Score 0 0 0 0 0 0 0  PHQ- 9 Score - - - - 0 - -     Fall Risk Fall Risk  04/01/2021 11/18/2019 10/13/2017 09/01/2016  Falls in the past year? 0 0 Yes No  Number falls in past yr: 0 - 1 -  Injury with Fall? 0 0 No -  Risk for fall due to :  No Fall Risks No Fall Risks Other (Comment) -  Follow up Falls evaluation completed Falls evaluation completed Falls evaluation completed -    FALL RISK PREVENTION PERTAINING TO THE HOME:  Any stairs in or around the home? Yes  If so, are there any without handrails? No  Home free of loose throw rugs in walkways, pet beds, electrical cords, etc? Yes  Adequate lighting in your home to reduce risk of falls? Yes   ASSISTIVE DEVICES UTILIZED TO PREVENT FALLS:  Life alert? No  Use of a cane, walker or w/c? No  Grab bars in the bathroom? No  Shower chair or bench in shower? Yes  Elevated toilet seat or a handicapped toilet? Yes   TIMED UP AND GO:  Was the test performed? No .  Length of time to ambulate 10 feet: n/a sec.   Gait steady and fast without use of assistive device  Cognitive Function: Normal cognitive status assessed by direct observation by this Nurse Health Advisor. No abnormalities found.          Immunizations Immunization History  Administered Date(s) Administered   Fluad Quad(high Dose 65+) 10/20/2018   Influenza Inj Mdck Quad Pf 12/18/2016   Influenza, High Dose Seasonal PF 11/11/2017   Influenza-Unspecified 11/30/2014, 11/11/2017   Moderna Covid-19 Vaccine Bivalent Booster 55yrs & up 12/04/2020   Moderna SARS-COV2 Booster Vaccination 06/20/2020   Moderna Sars-Covid-2 Vaccination 03/26/2019, 04/25/2019   Pneumococcal Conjugate-13 04/26/2018   Pneumococcal Polysaccharide-23 11/04/2019   Tdap 02/25/2008, 08/21/2016   Zoster Recombinat (Shingrix) 03/18/2021    TDAP status: Up to date  Flu Vaccine status: Up to date  Pneumococcal vaccine status: Up to date  Covid-19 vaccine status: Completed vaccines  Qualifies for Shingles Vaccine? Yes   Zostavax completed Yes    Shingrix Completed?: No.    Education has been provided regarding the importance of this vaccine. Patient has been advised to call insurance company to determine out of pocket expense if they have not yet received this vaccine. Advised may also receive vaccine at local pharmacy or Health Dept. Verbalized acceptance and understanding.  Screening Tests Health Maintenance  Topic Date Due   COLONOSCOPY (Pts 45-53yrs Insurance coverage will need to be confirmed)  05/24/2018   COVID-19 Vaccine (3 - Moderna risk series) 12/04/2020   DEXA SCAN  10/19/2028 (Originally 01/04/2017)   Zoster Vaccines- Shingrix (2 of 2) 05/13/2021   MAMMOGRAM  08/15/2022   TETANUS/TDAP  08/22/2026   Pneumonia Vaccine 45+ Years old  Completed   INFLUENZA VACCINE  Completed   Hepatitis C Screening  Completed   HPV VACCINES  Aged Out    Health Maintenance  Health Maintenance Due  Topic Date Due   COLONOSCOPY (Pts 45-72yrs Insurance coverage will need to be confirmed)  05/24/2018   COVID-19 Vaccine (3 - Moderna risk series) 12/04/2020    Colorectal cancer screening: Type of screening: Colonoscopy. Completed 05/23/2013. Repeat every 5 years (scheduled for 04/2021 per patient)  Mammogram status: Completed 08/14/2020. Repeat every year  Bone Density status: Completed 01/05/2015. Results reflect: Bone density results: OSTEOPOROSIS. Repeat every 2 years. (Scheduled for 07/2021 per patient)  Lung Cancer Screening: (Low Dose CT Chest recommended if Age 15-80 years, 30 pack-year currently smoking OR have quit w/in 15years.) does not qualify.   Lung Cancer Screening Referral: no  Additional Screening:  Hepatitis C Screening: does qualify; Completed yes  Vision Screening: Recommended annual ophthalmology exams for early detection of glaucoma and other disorders of the eye. Is the patient up  to date with their annual eye exam?  Yes  Who is the provider or what is the name of the office in which the patient attends annual  eye exams? Luberta Mutter, MD and [facility] (for contact lenses) If pt is not established with a provider, would they like to be referred to a provider to establish care? No .   Dental Screening: Recommended annual dental exams for proper oral hygiene  Community Resource Referral / Chronic Care Management: CRR required this visit?  No   CCM required this visit?  No      Plan:     I have personally reviewed and noted the following in the patients chart:   Medical and social history Use of alcohol, tobacco or illicit drugs  Current medications and supplements including opioid prescriptions.  Functional ability and status Nutritional status Physical activity Advanced directives List of other physicians Hospitalizations, surgeries, and ER visits in previous 12 months Vitals Screenings to include cognitive, depression, and falls Referrals and appointments  In addition, I have reviewed and discussed with patient certain preventive protocols, quality metrics, and best practice recommendations. A written personalized care plan for preventive services as well as general preventive health recommendations were provided to patient.     Sheral Flow, LPN   02/26/7515   Nurse Notes:  Patient is cogitatively intact. There were no vitals filed for this visit. There is no height or weight on file to calculate BMI. Patient stated that she has no issues with gait or balance; does not use any assistive devices. Medications reviewed with patient; no opioid use noted.

## 2021-04-22 ENCOUNTER — Other Ambulatory Visit: Payer: Self-pay | Admitting: *Deleted

## 2021-04-22 DIAGNOSIS — I2510 Atherosclerotic heart disease of native coronary artery without angina pectoris: Secondary | ICD-10-CM

## 2021-04-22 DIAGNOSIS — R030 Elevated blood-pressure reading, without diagnosis of hypertension: Secondary | ICD-10-CM

## 2021-04-22 DIAGNOSIS — E785 Hyperlipidemia, unspecified: Secondary | ICD-10-CM

## 2021-04-22 MED ORDER — LOSARTAN POTASSIUM 25 MG PO TABS: 25.0000 mg | ORAL_TABLET | Freq: Every day | ORAL | 3 refills | Status: DC

## 2021-05-13 NOTE — Progress Notes (Deleted)
?Cardiology Office Note:   ? ?Date:  05/13/2021  ? ?ID:  Alicia Wilkerson, DOB Oct 11, 1950, MRN [id number] ? ?PCP:  Binnie Rail, MD ?  ?[facility] Providers ?Cardiologist:  Freada Bergeron, MD { ? ?Referring MD: Binnie Rail, MD  ? ? ?History of Present Illness:   ? ?Alicia Wilkerson is a 71 y.o. female with a hx of anxiety, anemia, secondary hyperparathyroidism (due to excess vitamin D), osteoporosis/osteopenia, spherocytosis s/p splenectomy, mild HTN, HLD, CAD, possible MAI by CT 12/2018  who was previously followed by Dr. Meda Coffee who now presents to clinic for follow-up. ? ?Per review of the record, TTE 10/2018 with LVEF 60-65%, no LVH, elevated LVEDP, diastolic dysfunction, normal PASP, mild TR. Coronary CTA 12/2018 with calcium score 544 (96th percentile for age/sex) with diffuse moderate to severe calcified plaque in the prox to mid LAD with possibly severe stenosis in mid LAD. FFR of LAD were prox 0.94, mid 0.92, distal 0.82, arguing against hemodynamically significant stenosis. Chest overread indicated appearance of the lungs suggested chronic indolent atypical infectious process such as myobacterium avium intracellulare (MAI) and aortic atherosclerosis. Pulmonology referral recommended but patient declined. Crestor and Aspirin were initiated.   ? ?Was last seen in clinic by Laurann Montana on 10/2020 where she was doing well with no CV symptoms.  ? ?Today, *** ? ?Past Medical History:  ?Diagnosis Date  ? Anemia   ? Anxiety disorder   ? Basal cell carcinoma (BCC) of left upper arm 12/2015  ? Basal cell carcinoma (BCC) of right lower leg 04/2016  ? Borderline hypertension   ? CAD (coronary artery disease)   ? a. nonobst by cor CT 12/2018.  ? CIN II (cervical intraepithelial neoplasia II)   ? CIN  1  ? Depression   ? Eczema   ? Family history of premature CAD   ? History of basal cell carcinoma (BCC) excision   ? left upper arm 2017;   right lower leg 2018;  bilateral lower leg and hip area 2019;   inner  right lower leg, outer right thigh, & upper outside left arm 09/ 2020  ? History of cervical dysplasia 2013  ? History of hereditary spherocytosis   ? s/p splenectomy in 1954  ? History of hyperparathyroidism   ? per pt yrs ago was put on mega dose of vit d which caused the hyperparathyroid resolved after stopping vit d  ? History of pelvic fracture 2013  ? MAI (mycobacterium avium-intracellulare) ([place])   ? ? possibly by CT 12/2018  ? Mild hyperlipidemia   ? Osteoporosis   ? PONV (postoperative nausea and vomiting)   ? S/P splenectomy   ? spherocytosis  ? Wears glasses   ? White coat syndrome without diagnosis of hypertension   ? ? ?Past Surgical History:  ?Procedure Laterality Date  ? ANKLE SURGERY  03/14/2015   ? CERVICAL CONIZATION W/BX N/A 12/13/2018  ? Procedure: CONIZATION CERVIX WITH BIOPSY;  Surgeon: Megan Salon, MD;  Location: [place];  Service: Gynecology;  Laterality: N/A;  ? CYSTOSCOPY N/A 10/18/2019  ? Procedure: CYSTOSCOPY;  Surgeon: Megan Salon, MD;  Location[facility];  Service: Gynecology;  Laterality: N/A;  ? IR RADIOLOGY PERIPHERAL GUIDED IV START  01/05/2019  ? IR US GUIDE VASC ACCESS RIGHT  01/05/2019  ? KNEE SURGERY Left 1991  ? per pt retained hardward  ? LEEP N/A 12/13/2018  ? Procedure: possible LOOP ELECTROSURGICAL EXCISION PROCEDURE (LEEP);  Surgeon: Hale Bogus  S, MD;  Location: [place];  Service: Gynecology;  Laterality: N/A;  ? ORIF ANKLE FRACTURE Left 03/14/2015  ? per pt retained hardware  ? SPLENECTOMY, TOTAL  1954  ? due to spherocytosis  ? TOTAL LAPAROSCOPIC HYSTERECTOMY WITH SALPINGECTOMY N/A 10/18/2019  ? Procedure: TOTAL LAPAROSCOPIC HYSTERECTOMY WITH BILATERAL SALPINGECTOMY AND BILATERAL OOPHORECTOMY;  Surgeon: Megan Salon, MD;  Location: [facility];  Service: Gynecology;  Laterality: N/A;  BILATERAL SALPINGECTOMY POSS OOPHORECTOMY  ? TUBAL LIGATION Bilateral 1995  ? ? ?Current Medications: ?No outpatient medications have been marked as  taking for the 05/15/21 encounter (Appointment) with Freada Bergeron, MD.  ?  ? ?Allergies:   Penicillins, Sulfa antibiotics, and Penicillin g  ? ?Social History  ? ?Socioeconomic History  ? Marital status: Widowed  ?  Spouse name: Not on file  ? Number of children: Not on file  ? Years of education: Not on file  ? Highest education level: Not on file  ?Occupational History  ? Not on file  ?Tobacco Use  ? Smoking status: Never  ? Smokeless tobacco: Never  ?Vaping Use  ? Vaping Use: Never used  ?Substance and Sexual Activity  ? Alcohol use: Not Currently  ? Drug use: No  ? Sexual activity: Not Currently  ?  Partners: Male  ?  Birth control/protection: Surgical, Post-menopausal  ?  Comment: BTL & hysterectomy  ?Other Topics Concern  ? Not on file  ?Social History Narrative  ? Not on file  ? ?Social Determinants of Health  ? ?Financial Resource Strain: Low Risk   ? Difficulty of Paying Living Expenses: Not hard at all  ?Food Insecurity: No Food Insecurity  ? Worried About Charity fundraiser in the Last Year: Never true  ? Ran Out of Food in the Last Year: Never true  ?Transportation Needs: No Transportation Needs  ? Lack of Transportation (Medical): No  ? Lack of Transportation (Non-Medical): No  ?Physical Activity: Sufficiently Active  ? Days of Exercise per Week: 4 days  ? Minutes of Exercise per Session: 50 min  ?Stress: No Stress Concern Present  ? Feeling of Stress : Not at all  ?Social Connections: Socially Isolated  ? Frequency of Communication with Friends and Family: More than three times a week  ? Frequency of Social Gatherings with Friends and Family: Never  ? Attends Religious Services: Never  ? Active Member of Clubs or Organizations: No  ? Attends Archivist Meetings: Never  ? Marital Status: Widowed  ?  ? ?Family History: ?The patient's ***family history includes Colon cancer in her mother; Crohn's disease in her daughter; Heart disease in her father and sister; Heart failure in her  father; Hypertension in her father; Osteoporosis in her mother; Skin cancer in her father. ? ?ROS:   ?Please see the history of present illness.    ?*** All other systems reviewed and are negative. ? ?EKGs/Labs/Other Studies Reviewed:   ? ?The following studies were reviewed today: ?Cardiac CTA 01/05/2019 ?IMPRESSION: ?1. Coronary calcium score of 544. This was 7 percentile for age and ?sex matched control. ?  ?2. Normal coronary origin with right dominance. ?  ?3. Diffuse moderate to severe calcified plaque in the proximal to ?mid LAD with possibly severe stenosis in the mid LAD. Additional ?analysis with CT FFR will be submitted. ?  ?FFR ? 1. Left Main:  No significant stenosis. ?  ?2. LAD: Proximal: 0.94, mid: 0.92, distal: 0.82. ?3. LCX: No significant stenosis. ?4.  RCA: No significant stenosis. ?  ?IMPRESSION: ?1. CT FFR analysis didn't show any significant stenosis. Aggressive ?medical management is recommended. ?  ?Non Cardiac Read ?IMPRESSION: ?1. The appearance of the lungs suggests chronic indolent atypical ?infectious process such as mycobacterium avium intracellulare (MAI). ?Outpatient referral to Pulmonology for further evaluation is ?suggested. ?2. Aortic atherosclerosis. ?  ?  ?Echo 11/24/18 ? 1. Left ventricular ejection fraction, by visual estimation, is 60 to  ?65%. The left ventricle has normal function. Normal left ventricular size.  ?There is no left ventricular hypertrophy.  ? 2. Elevated left ventricular end-diastolic pressure.  ? 3. Left ventricular diastolic Doppler parameters are consistent with  ?impaired relaxation pattern of LV diastolic filling.  ? 4. Global right ventricle has normal systolic function.The right  ?ventricular size is normal. No increase in right ventricular wall  ?thickness.  ? 5. Left atrial size was normal.  ? 6. Right atrial size was normal.  ? 7. The mitral valve is normal in structure. No evidence of mitral valve  ?regurgitation. No evidence of mitral stenosis.  ?  8. The tricuspid valve is normal in structure. Tricuspid valve  ?regurgitation is mild.  ? 9. The aortic valve was not well visualized Aortic valve regurgitation  ?was not visualized by color flow Doppler. Stru

## 2021-05-14 NOTE — Progress Notes (Signed)
?Cardiology Office Note:   ? ?Date:  05/15/2021  ? ?ID:  Alicia Wilkerson, DOB [birth year], MRN [id number] ? ?PCP:  Binnie Rail, MD ?  ?[facility] Providers ?Cardiologist:  Freada Bergeron, MD { ? ?Referring MD: Binnie Rail, MD  ? ? ?History of Present Illness:   ? ?Alicia Wilkerson is a 71 y.o. female with a hx of anxiety, anemia, secondary hyperparathyroidism (due to excess vitamin D), osteoporosis/osteopenia, spherocytosis s/p splenectomy, mild HTN, HLD, CAD, possible MAI by CT 12/2018  who was previously followed by Dr. Meda Coffee who now presents to clinic for follow-up. ? ?Per review of the record, TTE 10/2018 with LVEF 60-65%, no LVH, elevated LVEDP, diastolic dysfunction, normal PASP, mild TR. Coronary CTA 12/2018 with calcium score 544 (96th percentile for age/sex) with diffuse moderate to severe calcified plaque in the prox to mid LAD with possibly severe stenosis in mid LAD. FFR of LAD were prox 0.94, mid 0.92, distal 0.82, arguing against hemodynamically significant stenosis. Chest overread indicated appearance of the lungs suggested chronic indolent atypical infectious process such as myobacterium avium intracellulare (MAI) and aortic atherosclerosis. Pulmonology referral recommended but patient declined. Crestor and Aspirin were initiated.   ? ?Was last seen in clinic by Laurann Montana on 10/2020 where she was doing well with no CV symptoms.  ? ?Today, she is doing well. In February, she noticed her blood pressure elevated to 295M systolic over 84X diastolic. Recently, her blood pressure at home is better controlled. She denies chest pain, chest pressure, palpitations, PND, orthopnea, or leg swelling. ? ?She notes some dyspnea on exertion and states she occasionally has to stop to catch her breath during a hike or with walking but then is able to resume her activity. No chest pain at this time. She thinks she may have become slightly deconditioned after not exercising regularly during winter.   ? ?Otherwise, she is compliant in her medications. She does notice she takes longer to remember information while on the statin. She also endorses dry mouth with her increased Lexapro. However, she is able to tolerate these symptoms.  ? ?Past Medical History:  ?Diagnosis Date  ? Anemia   ? Anxiety disorder   ? Basal cell carcinoma (BCC) of left upper arm 12/2015  ? Basal cell carcinoma (BCC) of right lower leg 04/2016  ? Borderline hypertension   ? CAD (coronary artery disease)   ? a. nonobst by cor CT 12/2018.  ? CIN II (cervical intraepithelial neoplasia II)   ? CIN  1  ? Depression   ? Eczema   ? Family history of premature CAD   ? History of basal cell carcinoma (BCC) excision   ? left upper arm 2017;   right lower leg 2018;  bilateral lower leg and hip area 2019;   inner right lower leg, outer right thigh, & upper outside left arm 09/ 2020  ? History of cervical dysplasia 2013  ? History of hereditary spherocytosis   ? s/p splenectomy in 1954  ? History of hyperparathyroidism   ? per pt yrs ago was put on mega dose of vit d which caused the hyperparathyroid resolved after stopping vit d  ? History of pelvic fracture 2013  ? MAI (mycobacterium avium-intracellulare) ([place])   ? ? possibly by CT 12/2018  ? Mild hyperlipidemia   ? Osteoporosis   ? PONV (postoperative nausea and vomiting)   ? S/P splenectomy   ? spherocytosis  ? Wears glasses   ? White coat  syndrome without diagnosis of hypertension   ? ? ?Past Surgical History:  ?Procedure Laterality Date  ? ANKLE SURGERY  03/14/2015   ? CERVICAL CONIZATION W/BX N/A 12/13/2018  ? Procedure: CONIZATION CERVIX WITH BIOPSY;  Surgeon: Megan Salon, MD;  Location: [place];  Service: Gynecology;  Laterality: N/A;  ? CYSTOSCOPY N/A 10/18/2019  ? Procedure: CYSTOSCOPY;  Surgeon: Megan Salon, MD;  Location: [facility];  Service: Gynecology;  Laterality: N/A;  ? IR RADIOLOGY PERIPHERAL GUIDED IV START  01/05/2019  ? IR US GUIDE VASC ACCESS RIGHT  01/05/2019   ? KNEE SURGERY Left 1991  ? per pt retained hardward  ? LEEP N/A 12/13/2018  ? Procedure: possible LOOP ELECTROSURGICAL EXCISION PROCEDURE (LEEP);  Surgeon: Megan Salon, MD;  Location: [place];  Service: Gynecology;  Laterality: N/A;  ? ORIF ANKLE FRACTURE Left 03/14/2015  ? per pt retained hardware  ? SPLENECTOMY, TOTAL  1954  ? due to spherocytosis  ? TOTAL LAPAROSCOPIC HYSTERECTOMY WITH SALPINGECTOMY N/A 10/18/2019  ? Procedure: TOTAL LAPAROSCOPIC HYSTERECTOMY WITH BILATERAL SALPINGECTOMY AND BILATERAL OOPHORECTOMY;  Surgeon: Megan Salon, MD;  Location: [facility];  Service: Gynecology;  Laterality: N/A;  BILATERAL SALPINGECTOMY POSS OOPHORECTOMY  ? TUBAL LIGATION Bilateral 1995  ? ? ?Current Medications: ?Current Meds  ?Medication Sig  ? ASPIRIN 81 PO Take 81 mg by mouth daily.  ? clonazePAM (KLONOPIN) 0.5 MG tablet Take 1 tablet (0.5 mg total) by mouth daily as needed for anxiety.  ? escitalopram (LEXAPRO) 10 MG tablet Take 1 tablet (10 mg total) by mouth daily.  ? halobetasol (ULTRAVATE) 0.05 % cream Apply 1 application topically daily as needed (irritation).   ? losartan (COZAAR) 25 MG tablet Take 1 tablet (25 mg total) by mouth daily.  ? methocarbamol (ROBAXIN) 500 MG tablet Take 1 tablet (500 mg total) by mouth 4 (four) times daily.  ? NONFORMULARY OR COMPOUNDED ITEM Vitamin E vaginal suppositories 200u/ml.  One pv three times weekly.  ? Omega-3 Fatty Acids (FISH OIL PO) Take by mouth.  ? rosuvastatin (CRESTOR) 20 MG tablet Take 1 tablet (20 mg total) by mouth daily.  ?  ? ?Allergies:   Penicillins, Sulfa antibiotics, and Penicillin g  ? ?Social History  ? ?Socioeconomic History  ? Marital status: Widowed  ?  Spouse name: Not on file  ? Number of children: Not on file  ? Years of education: Not on file  ? Highest education level: Not on file  ?Occupational History  ? Not on file  ?Tobacco Use  ? Smoking status: Never  ? Smokeless tobacco: Never  ?Vaping Use  ? Vaping Use: Never used   ?Substance and Sexual Activity  ? Alcohol use: Not Currently  ? Drug use: No  ? Sexual activity: Not Currently  ?  Partners: Male  ?  Birth control/protection: Surgical, Post-menopausal  ?  Comment: BTL & hysterectomy  ?Other Topics Concern  ? Not on file  ?Social History Narrative  ? Not on file  ? ?Social Determinants of Health  ? ?Financial Resource Strain: Low Risk   ? Difficulty of Paying Living Expenses: Not hard at all  ?Food Insecurity: No Food Insecurity  ? Worried About Charity fundraiser in the Last Year: Never true  ? Ran Out of Food in the Last Year: Never true  ?Transportation Needs: No Transportation Needs  ? Lack of Transportation (Medical): No  ? Lack of Transportation (Non-Medical): No  ?Physical Activity: Sufficiently Active  ?  Days of Exercise per Week: 4 days  ? Minutes of Exercise per Session: 50 min  ?Stress: No Stress Concern Present  ? Feeling of Stress : Not at all  ?Social Connections: Socially Isolated  ? Frequency of Communication with Friends and Family: More than three times a week  ? Frequency of Social Gatherings with Friends and Family: Never  ? Attends Religious Services: Never  ? Active Member of Clubs or Organizations: No  ? Attends Archivist Meetings: Never  ? Marital Status: Widowed  ?  ? ?Family History: ?The patient's family history includes Colon cancer in her mother; Crohn's disease in her daughter; Heart disease in her father and sister; Heart failure in her father; Hypertension in her father; Osteoporosis in her mother; Skin cancer in her father. ? ?ROS:   ?Please see the history of present illness.    ?Review of Systems  ?Constitutional:  Negative for malaise/fatigue and weight loss.  ?HENT:  Positive for sore throat (Dry Mouth). Negative for congestion.   ?Eyes:  Negative for blurred vision.  ?Respiratory:  Positive for shortness of breath (Exertional). Negative for cough.   ?Cardiovascular:  Negative for chest pain, palpitations, orthopnea, claudication,  leg swelling and PND.  ?Gastrointestinal:  Negative for heartburn and nausea.  ?Genitourinary:  Negative for dysuria and urgency.  ?Musculoskeletal:  Negative for joint pain and myalgias.  ?Skin:  Negative for i

## 2021-05-15 ENCOUNTER — Encounter: Payer: Self-pay | Admitting: Cardiology

## 2021-05-15 ENCOUNTER — Ambulatory Visit: Payer: Medicare HMO | Admitting: Cardiology

## 2021-05-15 ENCOUNTER — Other Ambulatory Visit: Payer: Self-pay

## 2021-05-15 VITALS — BP 154/88 | HR 72 | Ht 63.0 in | Wt 136.8 lb

## 2021-05-15 DIAGNOSIS — I25118 Atherosclerotic heart disease of native coronary artery with other forms of angina pectoris: Secondary | ICD-10-CM

## 2021-05-15 DIAGNOSIS — I10 Essential (primary) hypertension: Secondary | ICD-10-CM

## 2021-05-15 DIAGNOSIS — E785 Hyperlipidemia, unspecified: Secondary | ICD-10-CM

## 2021-05-15 NOTE — Patient Instructions (Signed)
Medication Instructions:  ? ?Your physician recommends that you continue on your current medications as directed. Please refer to the Current Medication list given to you today. ? ?*If you need a refill on your cardiac medications before your next appointment, please call your pharmacy* ? ? ? ?Follow-Up: ?At [facility], you and your health needs are our priority.  As part of our continuing mission to provide you with exceptional heart care, we have created designated Provider Care Teams.  These Care Teams include your primary Cardiologist (physician) and Advanced Practice Providers (APPs -  Physician Assistants and Nurse Practitioners) who all work together to provide you with the care you need, when you need it. ? ?We recommend signing up for the patient portal called "MyChart".  Sign up information is provided on this After Visit Summary.  MyChart is used to connect with patients for Virtual Visits (Telemedicine).  Patients are able to view lab/test results, encounter notes, upcoming appointments, etc.  Non-urgent messages can be sent to your provider as well.   ?To learn more about what you can do with MyChart, go to [email or web address].   ? ?Your next appointment:   ?6 month(s) ? ?The format for your next appointment:   ?In Person ? ?Provider:   ?Freada Bergeron, MD { ? ? ? ?

## 2021-05-21 DIAGNOSIS — Z800 Family history of malignant neoplasm of digestive organs: Secondary | ICD-10-CM | POA: Diagnosis not present

## 2021-05-21 DIAGNOSIS — Z1211 Encounter for screening for malignant neoplasm of colon: Secondary | ICD-10-CM | POA: Diagnosis not present

## 2021-05-21 DIAGNOSIS — Z8379 Family history of other diseases of the digestive system: Secondary | ICD-10-CM | POA: Diagnosis not present

## 2021-05-22 ENCOUNTER — Ambulatory Visit (HOSPITAL_BASED_OUTPATIENT_CLINIC_OR_DEPARTMENT_OTHER): Payer: Medicare HMO | Admitting: Obstetrics & Gynecology

## 2021-05-22 ENCOUNTER — Other Ambulatory Visit (HOSPITAL_COMMUNITY)
Admission: RE | Admit: 2021-05-22 | Discharge: 2021-05-22 | Disposition: A | Discharge status: Home / Self Care | Payer: Medicare HMO | Source: Ambulatory Visit | Attending: Obstetrics & Gynecology | Admitting: Obstetrics & Gynecology

## 2021-05-22 ENCOUNTER — Encounter (HOSPITAL_BASED_OUTPATIENT_CLINIC_OR_DEPARTMENT_OTHER): Payer: Self-pay | Admitting: Obstetrics & Gynecology

## 2021-05-22 VITALS — BP 160/74 | HR 62 | Ht 63.0 in | Wt 134.0 lb

## 2021-05-22 DIAGNOSIS — B977 Papillomavirus as the cause of diseases classified elsewhere: Secondary | ICD-10-CM | POA: Insufficient documentation

## 2021-05-22 DIAGNOSIS — Z87410 Personal history of cervical dysplasia: Secondary | ICD-10-CM

## 2021-05-22 DIAGNOSIS — R87618 Other abnormal cytological findings on specimens from cervix uteri: Secondary | ICD-10-CM | POA: Insufficient documentation

## 2021-05-22 DIAGNOSIS — R69 Illness, unspecified: Secondary | ICD-10-CM | POA: Diagnosis not present

## 2021-05-22 NOTE — Progress Notes (Signed)
GYNECOLOGY  VISIT ? ?CC:   repeat pap smear ? ?HPI: ?71 y.o. G73P2003 Widowed White or Caucasian female here for follow up pap smear.  Pt has hx of TLH due to persistent abnormal pap smears.  Follow up pap smears since surgery have been done.  Last pap on 11/05/2020 showed epithelial atypia and +HR HPV.  Colposcopy was performed.  Biopsy did show area of atrophy with atypica.  No dysplasia was noted.  Specimen was scan from pathology standpoint.  Repeat pap smear recommended.  She has been using vaginal vit E prior to pap today. ? ? ?Patient Active Problem List  ? Diagnosis Date Noted  ? Diaphoresis 11/13/2020  ? Family history of malignant neoplasm of gastrointestinal tract 08/14/2020  ? Acute hearing loss of left ear 08/14/2020  ? Aortic atherosclerosis ([place]) 11/04/2019  ? Hyperlipidemia 11/03/2019  ? CAD (coronary artery disease) 11/03/2019  ? S/P total hysterectomy and bilateral salpingo-oophorectomy, 2021 11/03/2019  ? Hyperglycemia 10/19/2018  ? Abnormal EKG 04/26/2018  ? Hearing loss 10/13/2017  ? Basal cell carcinoma (BCC) of skin of lower extremity including hip 05/19/2017  ? Anxiety 10/10/2016  ? Eczema 10/10/2016  ? Back spasm 10/10/2016  ? H/O splenectomy for hemolytic spherocytosis 10/10/2016  ? H/O basal cell carcinoma excision 10/10/2016  ? Dysplasia of cervix, high grade CIN 2 08/21/2016  ? ? ?Past Medical History:  ?Diagnosis Date  ? Anemia   ? Anxiety disorder   ? Basal cell carcinoma (BCC) of left upper arm 12/2015  ? Basal cell carcinoma (BCC) of right lower leg 04/2016  ? Borderline hypertension   ? CAD (coronary artery disease)   ? a. nonobst by cor CT 12/2018.  ? CIN II (cervical intraepithelial neoplasia II)   ? CIN  1  ? Depression   ? Eczema   ? Family history of premature CAD   ? History of basal cell carcinoma (BCC) excision   ? left upper arm 2017;   right lower leg 2018;  bilateral lower leg and hip area 2019;   inner right lower leg, outer right thigh, & upper outside left arm 09/ 2020   ? History of cervical dysplasia 2013  ? History of hereditary spherocytosis   ? s/p splenectomy in 1954  ? History of hyperparathyroidism   ? per pt yrs ago was put on mega dose of vit d which caused the hyperparathyroid resolved after stopping vit d  ? History of pelvic fracture 2013  ? MAI (mycobacterium avium-intracellulare) ([place])   ? ? possibly by CT 12/2018  ? Mild hyperlipidemia   ? Osteoporosis   ? PONV (postoperative nausea and vomiting)   ? S/P splenectomy   ? spherocytosis  ? Wears glasses   ? White coat syndrome without diagnosis of hypertension   ? ? ?Past Surgical History:  ?Procedure Laterality Date  ? ANKLE SURGERY  03/14/2015   ? CERVICAL CONIZATION W/BX N/A 12/13/2018  ? Procedure: CONIZATION CERVIX WITH BIOPSY;  Surgeon: Megan Salon, MD;  Location: [place];  Service: Gynecology;  Laterality: N/A;  ? CYSTOSCOPY N/A 10/18/2019  ? Procedure: CYSTOSCOPY;  Surgeon: Megan Salon, MD;  Location: [facility];  Service: Gynecology;  Laterality: N/A;  ? IR RADIOLOGY PERIPHERAL GUIDED IV START  01/05/2019  ? IR US GUIDE VASC ACCESS RIGHT  01/05/2019  ? KNEE SURGERY Left 1991  ? per pt retained hardward  ? LEEP N/A 12/13/2018  ? Procedure: possible LOOP ELECTROSURGICAL EXCISION PROCEDURE (LEEP);  Surgeon: Sabra Heck,  Lemmie Evens, MD;  Location: [place];  Service: Gynecology;  Laterality: N/A;  ? ORIF ANKLE FRACTURE Left 03/14/2015  ? per pt retained hardware  ? SPLENECTOMY, TOTAL  1954  ? due to spherocytosis  ? TOTAL LAPAROSCOPIC HYSTERECTOMY WITH SALPINGECTOMY N/A 10/18/2019  ? Procedure: TOTAL LAPAROSCOPIC HYSTERECTOMY WITH BILATERAL SALPINGECTOMY AND BILATERAL OOPHORECTOMY;  Surgeon: Megan Salon, MD;  Location: [facility];  Service: Gynecology;  Laterality: N/A;  BILATERAL SALPINGECTOMY POSS OOPHORECTOMY  ? TUBAL LIGATION Bilateral 1995  ? ? ?MEDS:   ?Current Outpatient Medications on File Prior to Visit  ?Medication Sig Dispense Refill  ? ASPIRIN 81 PO Take 81 mg by mouth daily.     ? clonazePAM (KLONOPIN) 0.5 MG tablet Take 1 tablet (0.5 mg total) by mouth daily as needed for anxiety. 30 tablet 2  ? escitalopram (LEXAPRO) 10 MG tablet Take 1 tablet (10 mg total) by mouth daily. 90 tablet 3  ? halobetasol (ULTRAVATE) 0.05 % cream Apply 1 application topically daily as needed (irritation).     ? losartan (COZAAR) 25 MG tablet Take 1 tablet (25 mg total) by mouth daily. 90 tablet 3  ? methocarbamol (ROBAXIN) 500 MG tablet Take 1 tablet (500 mg total) by mouth 4 (four) times daily. 30 tablet 0  ? NONFORMULARY OR COMPOUNDED ITEM Vitamin E vaginal suppositories 200u/ml.  One pv three times weekly. 36 each 3  ? Omega-3 Fatty Acids (FISH OIL PO) Take by mouth.    ? rosuvastatin (CRESTOR) 20 MG tablet Take 1 tablet (20 mg total) by mouth daily. 90 tablet 2  ? ?No current facility-administered medications on file prior to visit.  ? ? ?ALLERGIES: Penicillins, Sulfa antibiotics, and Penicillin g ? ?Family History  ?Problem Relation Age of Onset  ? Heart failure Father   ? Skin cancer Father   ? Hypertension Father   ? Heart disease Father   ? Osteoporosis Mother   ? Colon cancer Mother   ?     deceased  ? Heart disease Sister   ? Crohn's disease Daughter   ? ? ?SH:  divorced, non smoker ? ?Review of Systems  ?All other systems reviewed and are negative. ? ?PHYSICAL EXAMINATION:   ? ?BP (!) 160/74 (BP Location: Right Arm, Patient Position: Sitting, Cuff Size: Large)   Pulse 62   Ht '5\' 3"'$  (1.6 m) Comment: reported  Wt 134 lb (60.8 kg)   LMP 02/24/2002   BMI 23.74 kg/m?     ?General appearance: alert, cooperative and appears stated age ? ?Lymph:  no inguinal LAD noted ? ?Pelvic: External genitalia:  no lesions ?             Urethra:  normal appearing urethra with no masses, tenderness or lesions ?             Bartholins and Skenes: normal    ?             Vagina: normal appearing vagina with normal color and discharge, no lesions ?             Cervix: absent ?             Bimanual Exam:  Uterus:   uterus absent ?             ?Chaperone, Octaviano Batty, CMA, was present for exam. ? ?Assessment/Plan: ?There are no diagnoses linked to this encounter. ? ? ?

## 2021-05-27 LAB — CYTOLOGY - PAP
Comment: NEGATIVE
Comment: NEGATIVE
Comment: NEGATIVE
Diagnosis: UNDETERMINED — AB
HPV 16: NEGATIVE
HPV 18 / 45: NEGATIVE
High risk HPV: POSITIVE — AB

## 2021-05-29 ENCOUNTER — Telehealth (HOSPITAL_BASED_OUTPATIENT_CLINIC_OR_DEPARTMENT_OTHER): Payer: Self-pay | Admitting: Obstetrics & Gynecology

## 2021-05-29 NOTE — Telephone Encounter (Signed)
Patient called and stated she would like for the nurse to please call her back . ?

## 2021-06-04 ENCOUNTER — Telehealth (HOSPITAL_BASED_OUTPATIENT_CLINIC_OR_DEPARTMENT_OTHER): Payer: Self-pay | Admitting: *Deleted

## 2021-06-04 NOTE — Telephone Encounter (Signed)
Pt called to report that she has a non-tender, soft, golf ball sized growth that has showed up in her groin on the right side. She states that it was not there when she saw Dr. Sabra Heck a couple weeks ago. Pt provided with appt to see Dr. Sabra Heck when she is back in the office. Pt is also out of town until 4/19. Advised pt that if the area becomes painful, continues to increase in sizes, develops any redness, or fever, she should go to urgent care for evaluation. Pt verbalized understanding.  ?

## 2021-06-18 ENCOUNTER — Encounter (HOSPITAL_BASED_OUTPATIENT_CLINIC_OR_DEPARTMENT_OTHER): Payer: Self-pay | Admitting: Obstetrics & Gynecology

## 2021-06-18 ENCOUNTER — Ambulatory Visit (HOSPITAL_BASED_OUTPATIENT_CLINIC_OR_DEPARTMENT_OTHER): Payer: Medicare HMO | Admitting: Obstetrics & Gynecology

## 2021-06-18 VITALS — BP 135/71 | HR 53 | Ht 67.0 in | Wt 134.0 lb

## 2021-06-18 DIAGNOSIS — K4090 Unilateral inguinal hernia, without obstruction or gangrene, not specified as recurrent: Secondary | ICD-10-CM

## 2021-06-18 DIAGNOSIS — R110 Nausea: Secondary | ICD-10-CM

## 2021-06-18 DIAGNOSIS — T50Z95A Adverse effect of other vaccines and biological substances, initial encounter: Secondary | ICD-10-CM | POA: Diagnosis not present

## 2021-06-18 MED ORDER — PROMETHAZINE HCL 12.5 MG PO TABS: 12.5000 mg | ORAL_TABLET | ORAL | 0 refills | Status: DC | PRN

## 2021-06-18 NOTE — Progress Notes (Signed)
GYNECOLOGY  VISIT ? ?CC:   lump in groin ? ?HPI: ?71 y.o. G13P2003 Widowed White or Caucasian female here for complaint of new lump in groin.  Symptoms started after she visited her son in [place] and his dog lumped on her lab.  The dog's foot stepped on her right groin region and there was significant pain for the pain for a few days.  This was accompanied by some swelling that did seem to resolve.  Now there is a bulge that is intermittently present--goes away with lying down and is present when standing.  Denies pain.  Not sure what it is. ? ?Also, unrelated, pt is going to have second shingles vaccines.  Had severe nausea with it. ? ?Patient Active Problem List  ? Diagnosis Date Noted  ? Diaphoresis 11/13/2020  ? Family history of malignant neoplasm of gastrointestinal tract 08/14/2020  ? Acute hearing loss of left ear 08/14/2020  ? Aortic atherosclerosis ([place]) 11/04/2019  ? Hyperlipidemia 11/03/2019  ? CAD (coronary artery disease) 11/03/2019  ? S/P total hysterectomy and bilateral salpingo-oophorectomy, 2021 11/03/2019  ? Hyperglycemia 10/19/2018  ? Abnormal EKG 04/26/2018  ? Hearing loss 10/13/2017  ? Basal cell carcinoma (BCC) of skin of lower extremity including hip 05/19/2017  ? Anxiety 10/10/2016  ? Eczema 10/10/2016  ? Back spasm 10/10/2016  ? H/O splenectomy for hemolytic spherocytosis 10/10/2016  ? H/O basal cell carcinoma excision 10/10/2016  ? History of cervical dysplasia 08/21/2016  ? ? ?Past Medical History:  ?Diagnosis Date  ? Anemia   ? Anxiety disorder   ? Basal cell carcinoma (BCC) of left upper arm 12/2015  ? Basal cell carcinoma (BCC) of right lower leg 04/2016  ? Borderline hypertension   ? CAD (coronary artery disease)   ? a. nonobst by cor CT 12/2018.  ? CIN II (cervical intraepithelial neoplasia II)   ? CIN  1  ? Depression   ? Eczema   ? Family history of premature CAD   ? History of basal cell carcinoma (BCC) excision   ? left upper arm 2017;   right lower leg 2018;  bilateral lower leg  and hip area 2019;   inner right lower leg, outer right thigh, & upper outside left arm 09/ 2020  ? History of cervical dysplasia 2013  ? History of hereditary spherocytosis   ? s/p splenectomy in 1954  ? History of hyperparathyroidism   ? per pt yrs ago was put on mega dose of vit d which caused the hyperparathyroid resolved after stopping vit d  ? History of pelvic fracture 2013  ? MAI (mycobacterium avium-intracellulare) ([place])   ? ? possibly by CT 12/2018  ? Mild hyperlipidemia   ? Osteoporosis   ? PONV (postoperative nausea and vomiting)   ? S/P splenectomy   ? spherocytosis  ? Wears glasses   ? White coat syndrome without diagnosis of hypertension   ? ? ?Past Surgical History:  ?Procedure Laterality Date  ? ANKLE SURGERY  03/14/2015   ? CERVICAL CONIZATION W/BX N/A 12/13/2018  ? Procedure: CONIZATION CERVIX WITH BIOPSY;  Surgeon: Megan Salon, MD;  Location: [place];  Service: Gynecology;  Laterality: N/A;  ? CYSTOSCOPY N/A 10/18/2019  ? Procedure: CYSTOSCOPY;  Surgeon: Megan Salon, MD;  Location: [facility];  Service: Gynecology;  Laterality: N/A;  ? IR RADIOLOGY PERIPHERAL GUIDED IV START  01/05/2019  ? IR US GUIDE VASC ACCESS RIGHT  01/05/2019  ? KNEE SURGERY Left 1991  ? per pt  retained hardward  ? LEEP N/A 12/13/2018  ? Procedure: possible LOOP ELECTROSURGICAL EXCISION PROCEDURE (LEEP);  Surgeon: Megan Salon, MD;  Location: [place];  Service: Gynecology;  Laterality: N/A;  ? ORIF ANKLE FRACTURE Left 03/14/2015  ? per pt retained hardware  ? SPLENECTOMY, TOTAL  1954  ? due to spherocytosis  ? TOTAL LAPAROSCOPIC HYSTERECTOMY WITH SALPINGECTOMY N/A 10/18/2019  ? Procedure: TOTAL LAPAROSCOPIC HYSTERECTOMY WITH BILATERAL SALPINGECTOMY AND BILATERAL OOPHORECTOMY;  Surgeon: Megan Salon, MD;  Location: [facility];  Service: Gynecology;  Laterality: N/A;  BILATERAL SALPINGECTOMY POSS OOPHORECTOMY  ? TUBAL LIGATION Bilateral 1995  ? ? ?MEDS:   ?Current Outpatient Medications on  File Prior to Visit  ?Medication Sig Dispense Refill  ? ASPIRIN 81 PO Take 81 mg by mouth daily.    ? clonazePAM (KLONOPIN) 0.5 MG tablet Take 1 tablet (0.5 mg total) by mouth daily as needed for anxiety. 30 tablet 2  ? escitalopram (LEXAPRO) 10 MG tablet Take 1 tablet (10 mg total) by mouth daily. 90 tablet 3  ? halobetasol (ULTRAVATE) 0.05 % cream Apply 1 application topically daily as needed (irritation).     ? losartan (COZAAR) 25 MG tablet Take 1 tablet (25 mg total) by mouth daily. 90 tablet 3  ? methocarbamol (ROBAXIN) 500 MG tablet Take 1 tablet (500 mg total) by mouth 4 (four) times daily. 30 tablet 0  ? Omega-3 Fatty Acids (FISH OIL PO) Take by mouth.    ? rosuvastatin (CRESTOR) 20 MG tablet Take 1 tablet (20 mg total) by mouth daily. 90 tablet 2  ? ?No current facility-administered medications on file prior to visit.  ? ? ?ALLERGIES: Penicillins, Sulfa antibiotics, and Penicillin g ? ?Family History  ?Problem Relation Age of Onset  ? Heart failure Father   ? Skin cancer Father   ? Hypertension Father   ? Heart disease Father   ? Osteoporosis Mother   ? Colon cancer Mother   ?     deceased  ? Heart disease Sister   ? Crohn's disease Daughter   ? ? ?SH:  divorced, non smoker ? ?Review of Systems  ?Gastrointestinal:   ?     Right groin bulge   ? ?PHYSICAL EXAMINATION:   ? ?BP 135/71 (BP Location: Right Arm, Patient Position: Sitting, Cuff Size: Normal)   Pulse (!) 53   Ht '5\' 7"'$  (1.702 m) Comment: reported  Wt 134 lb (60.8 kg)   LMP 02/24/2002   BMI 20.99 kg/m?     ?General appearance: alert, cooperative and appears stated age ?Abdomen: soft, non-tender; bowel sounds normal; no masses,  no organomegaly.  With standing, very obvious right inguinal mass that is reducible and non tender ?Lymph:  no inguinal LAD noted ? ? ?Assessment/Plan: ?1. Non-recurrent unilateral inguinal hernia without obstruction or gangrene ?- Ambulatory referral to General Surgery ? ?2. Nausea after vaccination ?- pt is going to  receive second shingrix vaccination.  Rx for phenergan to pharmacy to use after administration. ? ? ? ?

## 2021-06-26 ENCOUNTER — Encounter: Payer: Self-pay | Admitting: Internal Medicine

## 2021-07-03 DIAGNOSIS — D2261 Melanocytic nevi of right upper limb, including shoulder: Secondary | ICD-10-CM | POA: Diagnosis not present

## 2021-07-03 DIAGNOSIS — L738 Other specified follicular disorders: Secondary | ICD-10-CM | POA: Diagnosis not present

## 2021-07-03 DIAGNOSIS — L814 Other melanin hyperpigmentation: Secondary | ICD-10-CM | POA: Diagnosis not present

## 2021-07-03 DIAGNOSIS — B078 Other viral warts: Secondary | ICD-10-CM | POA: Diagnosis not present

## 2021-07-03 DIAGNOSIS — L821 Other seborrheic keratosis: Secondary | ICD-10-CM | POA: Diagnosis not present

## 2021-07-03 DIAGNOSIS — D225 Melanocytic nevi of trunk: Secondary | ICD-10-CM | POA: Diagnosis not present

## 2021-07-03 DIAGNOSIS — D2271 Melanocytic nevi of right lower limb, including hip: Secondary | ICD-10-CM | POA: Diagnosis not present

## 2021-07-03 DIAGNOSIS — Z85828 Personal history of other malignant neoplasm of skin: Secondary | ICD-10-CM | POA: Diagnosis not present

## 2021-07-03 DIAGNOSIS — D1801 Hemangioma of skin and subcutaneous tissue: Secondary | ICD-10-CM | POA: Diagnosis not present

## 2021-07-15 DIAGNOSIS — H5203 Hypermetropia, bilateral: Secondary | ICD-10-CM | POA: Diagnosis not present

## 2021-07-15 DIAGNOSIS — H02831 Dermatochalasis of right upper eyelid: Secondary | ICD-10-CM | POA: Diagnosis not present

## 2021-07-15 DIAGNOSIS — H2513 Age-related nuclear cataract, bilateral: Secondary | ICD-10-CM | POA: Diagnosis not present

## 2021-07-15 DIAGNOSIS — H02835 Dermatochalasis of left lower eyelid: Secondary | ICD-10-CM | POA: Diagnosis not present

## 2021-07-17 DIAGNOSIS — Z1211 Encounter for screening for malignant neoplasm of colon: Secondary | ICD-10-CM | POA: Diagnosis not present

## 2021-07-17 DIAGNOSIS — Z800 Family history of malignant neoplasm of digestive organs: Secondary | ICD-10-CM | POA: Diagnosis not present

## 2021-07-17 LAB — HM COLONOSCOPY

## 2021-07-19 ENCOUNTER — Other Ambulatory Visit: Payer: Self-pay | Admitting: *Deleted

## 2021-07-19 MED ORDER — ROSUVASTATIN CALCIUM 20 MG PO TABS: 20.0000 mg | ORAL_TABLET | Freq: Every day | ORAL | 2 refills | Status: DC

## 2021-07-23 DIAGNOSIS — K4091 Unilateral inguinal hernia, without obstruction or gangrene, recurrent: Secondary | ICD-10-CM | POA: Diagnosis not present

## 2021-07-25 ENCOUNTER — Other Ambulatory Visit (HOSPITAL_BASED_OUTPATIENT_CLINIC_OR_DEPARTMENT_OTHER): Payer: Self-pay | Admitting: Obstetrics & Gynecology

## 2021-07-25 ENCOUNTER — Other Ambulatory Visit: Payer: Self-pay | Admitting: Obstetrics & Gynecology

## 2021-07-25 DIAGNOSIS — Z1231 Encounter for screening mammogram for malignant neoplasm of breast: Secondary | ICD-10-CM

## 2021-07-25 DIAGNOSIS — M8580 Other specified disorders of bone density and structure, unspecified site: Secondary | ICD-10-CM

## 2021-08-16 ENCOUNTER — Ambulatory Visit
Admission: RE | Admit: 2021-08-16 | Discharge: 2021-08-16 | Disposition: A | Discharge status: Home / Self Care | Payer: Medicare HMO | Source: Ambulatory Visit | Attending: Obstetrics & Gynecology | Admitting: Obstetrics & Gynecology

## 2021-08-16 DIAGNOSIS — Z1231 Encounter for screening mammogram for malignant neoplasm of breast: Secondary | ICD-10-CM | POA: Diagnosis not present

## 2021-08-20 ENCOUNTER — Other Ambulatory Visit: Payer: Self-pay | Admitting: Obstetrics & Gynecology

## 2021-08-20 DIAGNOSIS — R928 Other abnormal and inconclusive findings on diagnostic imaging of breast: Secondary | ICD-10-CM

## 2021-08-21 DIAGNOSIS — Z0100 Encounter for examination of eyes and vision without abnormal findings: Secondary | ICD-10-CM | POA: Diagnosis not present

## 2021-08-23 ENCOUNTER — Telehealth (HOSPITAL_BASED_OUTPATIENT_CLINIC_OR_DEPARTMENT_OTHER): Payer: Self-pay

## 2021-08-23 NOTE — Telephone Encounter (Signed)
Patient called and would like for you to give her a call. She states that she has received abnormal mammogram results of the left breast. Patient's number is [phone]. [email or web address]

## 2021-08-26 ENCOUNTER — Other Ambulatory Visit: Payer: Self-pay | Admitting: Obstetrics & Gynecology

## 2021-08-26 ENCOUNTER — Ambulatory Visit
Admission: RE | Admit: 2021-08-26 | Discharge: 2021-08-26 | Disposition: A | Discharge status: Home / Self Care | Payer: Medicare HMO | Source: Ambulatory Visit | Attending: Obstetrics & Gynecology | Admitting: Obstetrics & Gynecology

## 2021-08-26 DIAGNOSIS — N6323 Unspecified lump in the left breast, lower outer quadrant: Secondary | ICD-10-CM | POA: Diagnosis not present

## 2021-08-26 DIAGNOSIS — R928 Other abnormal and inconclusive findings on diagnostic imaging of breast: Secondary | ICD-10-CM

## 2021-08-28 ENCOUNTER — Other Ambulatory Visit: Payer: Self-pay | Admitting: Obstetrics & Gynecology

## 2021-08-28 ENCOUNTER — Other Ambulatory Visit: Payer: Medicare HMO

## 2021-08-28 DIAGNOSIS — N6320 Unspecified lump in the left breast, unspecified quadrant: Secondary | ICD-10-CM

## 2021-08-30 ENCOUNTER — Ambulatory Visit
Admission: RE | Admit: 2021-08-30 | Discharge: 2021-08-30 | Disposition: A | Discharge status: Home / Self Care | Payer: Medicare HMO | Source: Ambulatory Visit | Attending: Obstetrics & Gynecology | Admitting: Obstetrics & Gynecology

## 2021-08-30 DIAGNOSIS — N6323 Unspecified lump in the left breast, lower outer quadrant: Secondary | ICD-10-CM | POA: Diagnosis not present

## 2021-08-30 DIAGNOSIS — N6320 Unspecified lump in the left breast, unspecified quadrant: Secondary | ICD-10-CM

## 2021-08-30 DIAGNOSIS — N6012 Diffuse cystic mastopathy of left breast: Secondary | ICD-10-CM | POA: Diagnosis not present

## 2021-08-30 HISTORY — PX: BREAST BIOPSY: SHX20

## 2021-09-02 ENCOUNTER — Encounter: Payer: Self-pay | Admitting: Internal Medicine

## 2021-09-02 NOTE — Progress Notes (Signed)
Outside notes received. Information abstracted. Notes sent to scan.  

## 2021-09-11 IMAGING — MG DIGITAL SCREENING BILAT W/ CAD
4 series · 4 of 4 positions shown · non-contrast
Comparison: Previous exam(s).

CLINICAL DATA: Screening.

EXAM:
DIGITAL SCREENING BILATERAL MAMMOGRAM WITH CAD

[L CC]
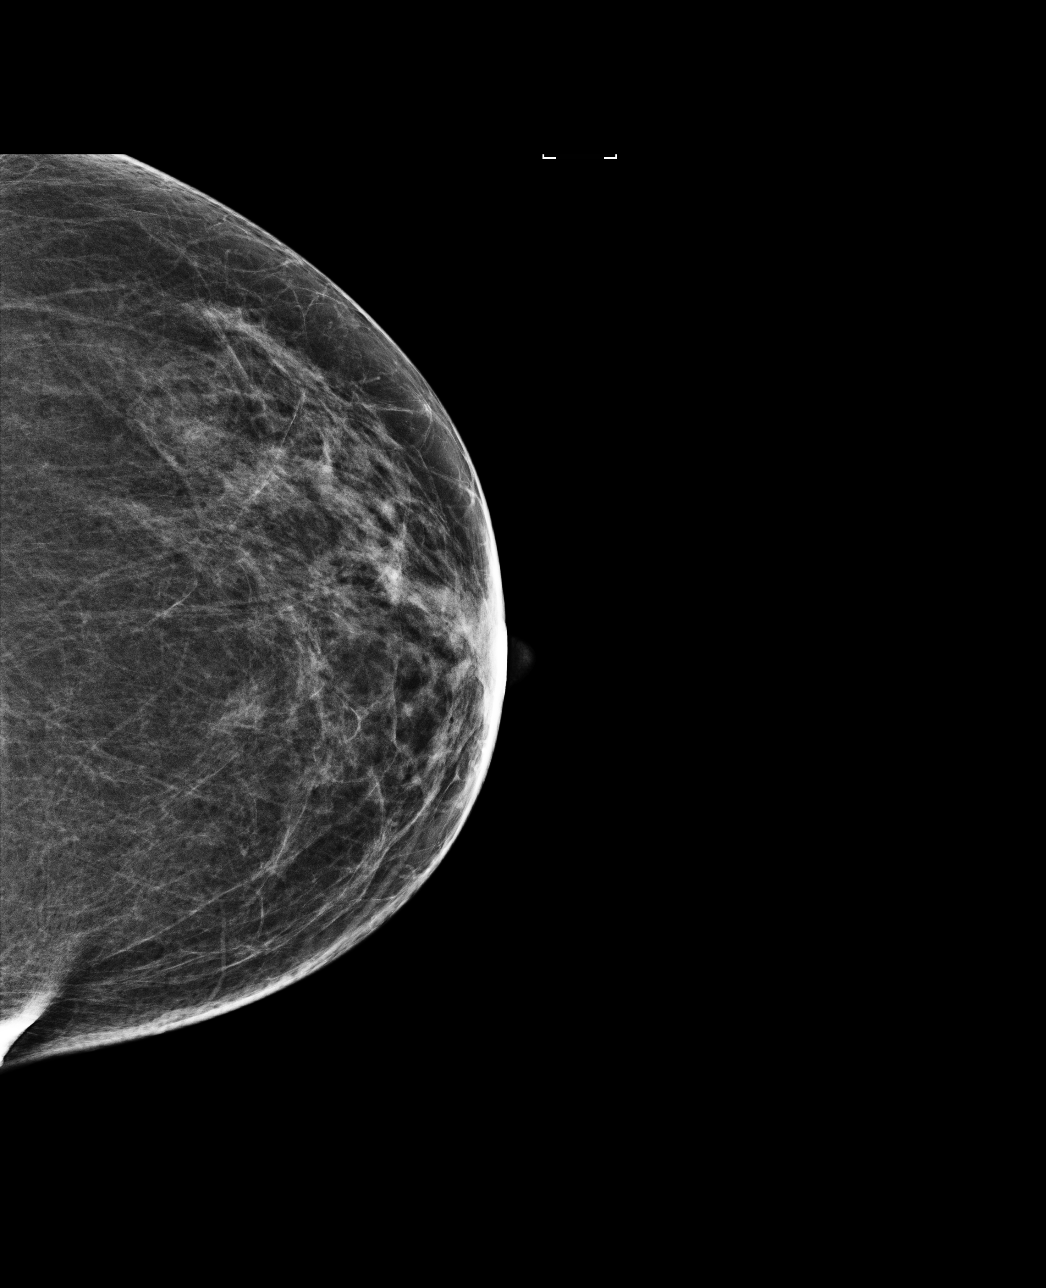

[R CC]
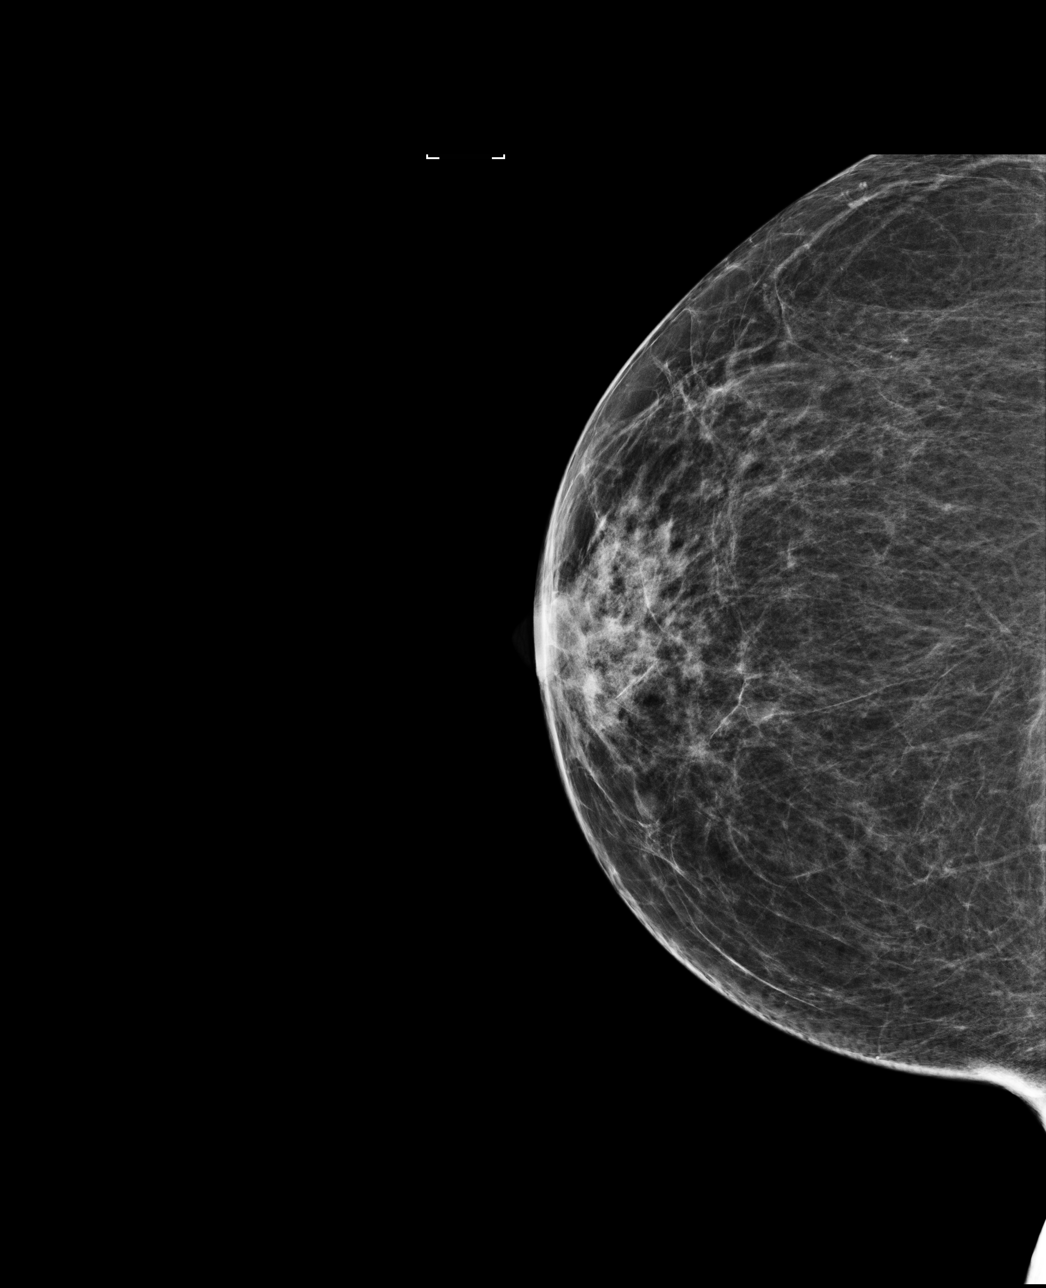

[L MLO]
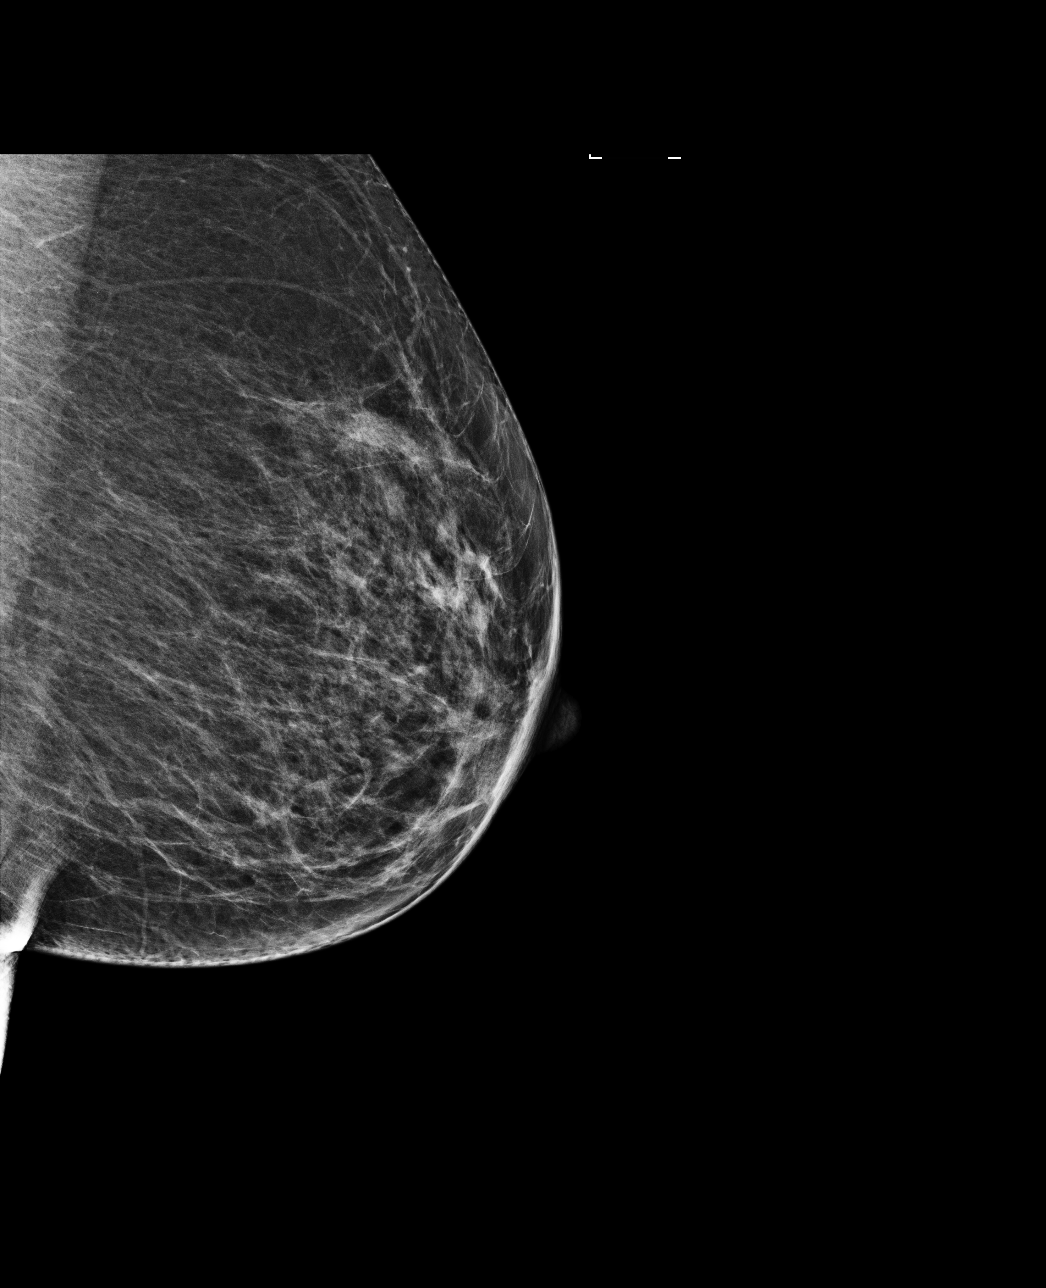

[R MLO]
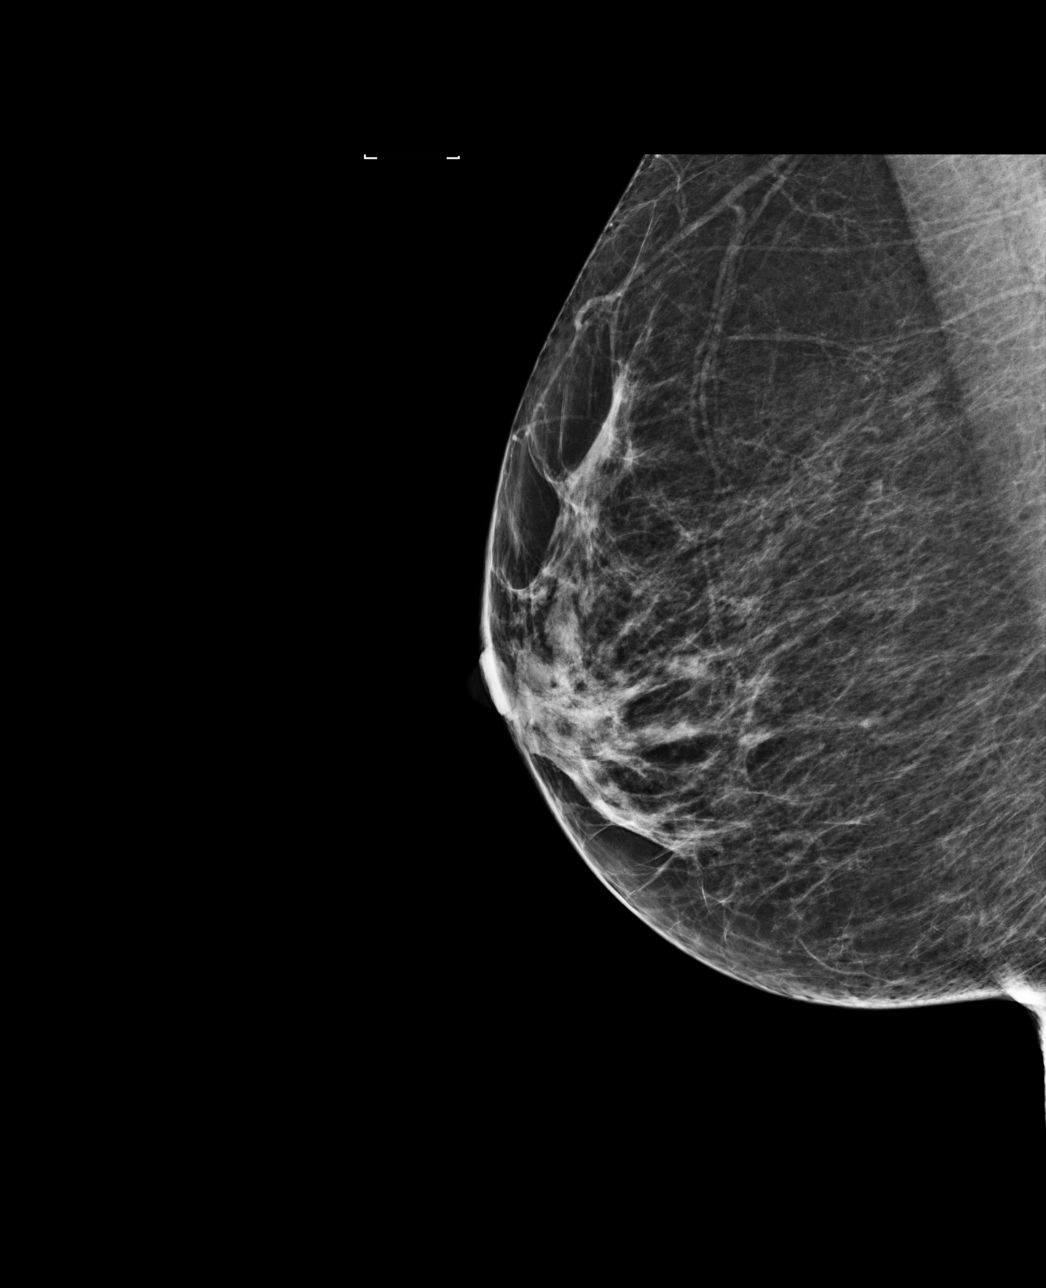

[4 of 4 positions shown; findings below may reference images not displayed]

ACR Breast Density Category b: There are scattered areas of
fibroglandular density.
FINDINGS: There are no findings suspicious for malignancy. Images were
processed with CAD.
IMPRESSION: No mammographic evidence of malignancy. A result letter of this
screening mammogram will be mailed directly to the patient.

RECOMMENDATION:
Screening mammogram in one year. (Code:AS-G-LCT)

BI-RADS CATEGORY  1: Negative.

## 2021-09-23 ENCOUNTER — Encounter (INDEPENDENT_AMBULATORY_CARE_PROVIDER_SITE_OTHER): Payer: Medicare HMO | Admitting: Internal Medicine

## 2021-09-23 DIAGNOSIS — Z7184 Encounter for health counseling related to travel: Secondary | ICD-10-CM | POA: Diagnosis not present

## 2021-09-25 DIAGNOSIS — K4090 Unilateral inguinal hernia, without obstruction or gangrene, not specified as recurrent: Secondary | ICD-10-CM | POA: Diagnosis not present

## 2021-09-25 NOTE — Telephone Encounter (Signed)
Please see the MyChart message reply(ies) for my assessment and plan.   The patient gave consent for this Medical Advice Message and is aware that it may result in a bill to their insurance company as well as the possibility that this may result in a co-payment or deductible. They are an established patient, but are not seeking medical advice exclusively about a problem treated during an in person or video visit in the last 7 days. I did not recommend an in person or video visit within 7 days of my reply.   I spent a total of 10 minutes cumulative time within 7 days through [employer] discussing travel advice and recommended vaccines. Binnie Rail, MD

## 2021-09-30 ENCOUNTER — Encounter (HOSPITAL_BASED_OUTPATIENT_CLINIC_OR_DEPARTMENT_OTHER): Payer: Self-pay | Admitting: Obstetrics & Gynecology

## 2021-10-09 ENCOUNTER — Other Ambulatory Visit (HOSPITAL_BASED_OUTPATIENT_CLINIC_OR_DEPARTMENT_OTHER): Payer: Self-pay | Admitting: Obstetrics & Gynecology

## 2021-10-09 MED ORDER — NONFORMULARY OR COMPOUNDED ITEM: 3 refills | Status: DC

## 2021-10-25 ENCOUNTER — Ambulatory Visit (INDEPENDENT_AMBULATORY_CARE_PROVIDER_SITE_OTHER): Payer: Medicare HMO

## 2021-10-25 DIAGNOSIS — Z23 Encounter for immunization: Secondary | ICD-10-CM

## 2021-11-06 NOTE — Progress Notes (Deleted)
Cardiology Office Note:    Date:  11/06/2021   ID:  Alicia Wilkerson, DOB 02-May-1950, MRN [id number]  PCP:  Binnie Rail, MD   [facility] Providers Cardiologist:  Freada Bergeron, MD {  Referring MD: Binnie Rail, MD    History of Present Illness:    Alicia Wilkerson is a 71 y.o. female with a hx of anxiety, anemia, secondary hyperparathyroidism (due to excess vitamin D), osteoporosis/osteopenia, spherocytosis s/p splenectomy, mild HTN, HLD, CAD, possible MAI by CT 12/2018  who was previously followed by Dr. Meda Coffee who now presents to clinic for follow-up.  Per review of the record, TTE 10/2018 with LVEF 60-65%, no LVH, elevated LVEDP, diastolic dysfunction, normal PASP, mild TR. Coronary CTA 12/2018 with calcium score 544 (96th percentile for age/sex) with diffuse moderate to severe calcified plaque in the prox to mid LAD with possibly severe stenosis in mid LAD. FFR of LAD were prox 0.94, mid 0.92, distal 0.82, arguing against hemodynamically significant stenosis. Chest overread indicated appearance of the lungs suggested chronic indolent atypical infectious process such as myobacterium avium intracellulare (MAI) and aortic atherosclerosis. Pulmonology referral recommended but patient declined. Crestor and Aspirin were initiated.    Was last seen in clinic on 04/2021 where she was doing well from a CV standpoint.  Today, ***  Past Medical History:  Diagnosis Date   Anemia    Anxiety disorder    Basal cell carcinoma (BCC) of left upper arm 12/2015   Basal cell carcinoma (BCC) of right lower leg 04/2016   Borderline hypertension    CAD (coronary artery disease)    a. nonobst by cor CT 12/2018.   CIN II (cervical intraepithelial neoplasia II)    CIN  1   Depression    Eczema    Family history of premature CAD    History of basal cell carcinoma (BCC) excision    left upper arm 2017;   right lower leg 2018;  bilateral lower leg and hip area 2019;   inner right lower leg,  outer right thigh, & upper outside left arm 09/ 2020   History of cervical dysplasia 2013   History of hereditary spherocytosis    s/p splenectomy in 1954   History of hyperparathyroidism    per pt yrs ago was put on mega dose of vit d which caused the hyperparathyroid resolved after stopping vit d   History of pelvic fracture 2013   MAI (mycobacterium avium-intracellulare) ([place])    ? possibly by CT 12/2018   Mild hyperlipidemia    Osteoporosis    PONV (postoperative nausea and vomiting)    S/P splenectomy    spherocytosis   Wears glasses    White coat syndrome without diagnosis of hypertension     Past Surgical History:  Procedure Laterality Date   ANKLE SURGERY  03/14/2015    CERVICAL CONIZATION W/BX N/A 12/13/2018   Procedure: CONIZATION CERVIX WITH BIOPSY;  Surgeon: Megan Salon, MD;  Location: [place];  Service: Gynecology;  Laterality: N/A;   CYSTOSCOPY N/A 10/18/2019   Procedure: CYSTOSCOPY;  Surgeon: Megan Salon, MD;  Location: [facility];  Service: Gynecology;  Laterality: N/A;   IR RADIOLOGY PERIPHERAL GUIDED IV START  01/05/2019   IR US GUIDE VASC ACCESS RIGHT  01/05/2019   KNEE SURGERY Left 1991   per pt retained hardward   LEEP N/A 12/13/2018   Procedure: possible LOOP ELECTROSURGICAL EXCISION PROCEDURE (LEEP);  Surgeon: Megan Salon, MD;  Location:  [place] [facility];  Service: Gynecology;  Laterality: N/A;   ORIF ANKLE FRACTURE Left 03/14/2015   per pt retained hardware   SPLENECTOMY, TOTAL  1954   due to spherocytosis   TOTAL LAPAROSCOPIC HYSTERECTOMY WITH SALPINGECTOMY N/A 10/18/2019   Procedure: TOTAL LAPAROSCOPIC HYSTERECTOMY WITH BILATERAL SALPINGECTOMY AND BILATERAL OOPHORECTOMY;  Surgeon: Megan Salon, MD;  Location: [place];  Service: Gynecology;  Laterality: N/A;  BILATERAL SALPINGECTOMY POSS OOPHORECTOMY   TUBAL LIGATION Bilateral 1995    Current Medications: No outpatient medications have been marked as taking for the  11/08/21 encounter (Appointment) with Freada Bergeron, MD.     Allergies:   Penicillins, Sulfa antibiotics, and Penicillin g   Social History   Socioeconomic History   Marital status: Widowed    Spouse name: Not on file   Number of children: Not on file   Years of education: Not on file   Highest education level: Not on file  Occupational History   Not on file  Tobacco Use   Smoking status: Never   Smokeless tobacco: Never  Vaping Use   Vaping Use: Never used  Substance and Sexual Activity   Alcohol use: Not Currently   Drug use: No   Sexual activity: Not Currently    Partners: Male    Birth control/protection: Surgical, Post-menopausal    Comment: BTL & hysterectomy  Other Topics Concern   Not on file  Social History Narrative   Not on file   Social Determinants of Health   Financial Resource Strain: Low Risk  (04/01/2021)   Overall Financial Resource Strain (CARDIA)    Difficulty of Paying Living Expenses: Not hard at all  Food Insecurity: No Food Insecurity (04/01/2021)   Hunger Vital Sign    Worried About Running Out of Food in the Last Year: Never true    [place] in the Last Year: Never true  Transportation Needs: No Transportation Needs (04/01/2021)   PRAPARE - Hydrologist (Medical): No    Lack of Transportation (Non-Medical): No  Physical Activity: Sufficiently Active (04/01/2021)   Exercise Vital Sign    Days of Exercise per Week: 4 days    Minutes of Exercise per Session: 50 min  Stress: No Stress Concern Present (04/01/2021)   [place]    Feeling of Stress : Not at all  Social Connections: Socially Isolated (04/01/2021)   Social Connection and Isolation Panel [NHANES]    Frequency of Communication with Friends and Family: More than three times a week    Frequency of Social Gatherings with Friends and Family: Never    Attends Religious Services: Never     Marine scientist or Organizations: No    Attends Archivist Meetings: Never    Marital Status: Widowed     Family History: The patient's family history includes Colon cancer in her mother; Crohn's disease in her daughter; Heart disease in her father and sister; Heart failure in her father; Hypertension in her father; Osteoporosis in her mother; Skin cancer in her father. There is no history of Breast cancer.  ROS:   Please see the history of present illness.    Review of Systems  Constitutional:  Negative for malaise/fatigue and weight loss.  HENT:  Positive for sore throat (Dry Mouth). Negative for congestion.   Eyes:  Negative for blurred vision.  Respiratory:  Positive for shortness of breath (Exertional). Negative for cough.  Cardiovascular:  Negative for chest pain, palpitations, orthopnea, claudication, leg swelling and PND.  Gastrointestinal:  Negative for heartburn and nausea.  Genitourinary:  Negative for dysuria and urgency.  Musculoskeletal:  Negative for joint pain and myalgias.  Skin:  Negative for itching and rash.  Neurological:  Negative for dizziness and headaches.  Endo/Heme/Allergies:  Does not bruise/bleed easily.  Psychiatric/Behavioral:  The patient is not nervous/anxious and does not have insomnia.    All other systems reviewed and are negative.  EKGs/Labs/Other Studies Reviewed:    The following studies were reviewed today: Cardiac CTA 01/05/2019 IMPRESSION: 1. Coronary calcium score of 544. This was 75 percentile for age and sex matched control.   2. Normal coronary origin with right dominance.   3. Diffuse moderate to severe calcified plaque in the proximal to mid LAD with possibly severe stenosis in the mid LAD. Additional analysis with CT FFR will be submitted.   FFR  1. Left Main:  No significant stenosis. 2. LAD: Proximal: 0.94, mid: 0.92, distal: 0.82. 3. LCX: No significant stenosis. 4. RCA: No significant stenosis.    IMPRESSION: 1. CT FFR analysis didn't show any significant stenosis. Aggressive medical management is recommended.   Non Cardiac Read IMPRESSION: 1. The appearance of the lungs suggests chronic indolent atypical infectious process such as mycobacterium avium intracellulare (MAI). Outpatient referral to Pulmonology for further evaluation is suggested. 2. Aortic atherosclerosis.   Echo 11/24/18  1. Left ventricular ejection fraction, by visual estimation, is 60 to 65%. The left ventricle has normal function. Normal left ventricular size. There is no left ventricular hypertrophy.   2. Elevated left ventricular end-diastolic pressure.   3. Left ventricular diastolic Doppler parameters are consistent with impaired relaxation pattern of LV diastolic filling.   4. Global right ventricle has normal systolic function.The right ventricular size is normal. No increase in right ventricular wall thickness.   5. Left atrial size was normal.   6. Right atrial size was normal.   7. The mitral valve is normal in structure. No evidence of mitral valve regurgitation. No evidence of mitral stenosis.   8. The tricuspid valve is normal in structure. Tricuspid valve regurgitation is mild.   9. The aortic valve was not well visualized Aortic valve regurgitation was not visualized by color flow Doppler. Structurally normal aortic valve, with no evidence of sclerosis or stenosis.  10. The pulmonic valve was normal in structure. Pulmonic valve regurgitation is not visualized by color flow Doppler.  11. Normal pulmonary artery systolic pressure.  12. The inferior vena cava is normal in size with greater than 50% respiratory variability, suggesting right atrial pressure of 3 mmHg.   EKG:  EKG was not ordered today  Recent Labs: 11/13/2020: ALT 15; BUN 12; Creatinine, Ser 0.65; Hemoglobin 12.9; Platelets 272.0; Potassium 4.3; Sodium 141; TSH 1.57  Recent Lipid Panel    Component Value Date/Time   CHOL 144 11/13/2020  1344   CHOL 135 03/25/2019 0829   TRIG 46.0 11/13/2020 1344   HDL 79.90 11/13/2020 1344   HDL 75 03/25/2019 0829   CHOLHDL 2 11/13/2020 1344   VLDL 9.2 11/13/2020 1344   LDLCALC 55 11/13/2020 1344   LDLCALC 67 11/04/2019 0955     Risk Assessment/Calculations:           Physical Exam:    VS:  LMP 02/24/2002     Wt Readings from Last 3 Encounters:  06/18/21 134 lb (60.8 kg)  05/22/21 134 lb (60.8 kg)  05/15/21 136 lb 12.8  oz (62.1 kg)     GEN: Well nourished, well developed in no acute distress HEENT: Normal NECK: No JVD; No carotid bruits CARDIAC: RRR, 2/6 systolic murmur best heard RUSB, no rubs or gallops RESPIRATORY:  Clear to auscultation without rales, wheezing or rhonchi  ABDOMEN: Soft, non-tender, non-distended MUSCULOSKELETAL:  No edema; No deformity  SKIN: Warm and dry NEUROLOGIC:  Alert and oriented x 3 PSYCHIATRIC:  Normal affect   ASSESSMENT:    No diagnosis found.  PLAN:    In order of problems listed above:  #CAD: Coronary CTA with moderate-to-severe calcified plaque in the prox-mid LAD with negative FFR. Ca score 544 (96%). Doing well without anginal symptoms.  -Continue ASA '81mg'$  daily -Continue crestor '20mg'$  daily -Continue losartan '25mg'$  daily  #MAI: Noted on CTA in 12/2018. Declined Pulm referral.  #HTN: Had episodes of elevated blood pressure in 03/2021 but now back to 110-120s. Will continue to monitor and if >130/90, can add amlodipine.  -Continue losartan '25mg'$  daily  #HLD: LDL well controlled at 55.  -Continue crestor '20mg'$  daily    Medication Adjustments/Labs and Tests Ordered: Current medicines are reviewed at length with the patient today.  Concerns regarding medicines are outlined above.  No orders of the defined types were placed in this encounter.  No orders of the defined types were placed in this encounter.   There are no Patient Instructions on file for this visit.    Signed, Freada Bergeron, MD  11/06/2021 3:40  PM    [facility]

## 2021-11-07 ENCOUNTER — Ambulatory Visit (HOSPITAL_BASED_OUTPATIENT_CLINIC_OR_DEPARTMENT_OTHER): Payer: Medicare HMO | Admitting: Obstetrics & Gynecology

## 2021-11-07 NOTE — Progress Notes (Signed)
Cardiology Office Note:    Date:  11/08/2021   ID:  Alicia Wilkerson, DOB [birth year], MRN [id number]  PCP:  Binnie Rail, MD   [facility] Providers Cardiologist:  Freada Bergeron, MD {  Referring MD: Binnie Rail, MD    History of Present Illness:    Alicia Wilkerson is a 71 y.o. female with a hx of anxiety, anemia, secondary hyperparathyroidism (due to excess vitamin D), osteoporosis/osteopenia, spherocytosis s/p splenectomy, mild HTN, HLD, CAD, possible MAI by CT 12/2018  who was previously followed by Dr. Meda Coffee who now presents to clinic for follow-up.  Per review of the record, TTE 10/2018 with LVEF 60-65%, no LVH, elevated LVEDP, diastolic dysfunction, normal PASP, mild TR. Coronary CTA 12/2018 with calcium score 544 (96th percentile for age/sex) with diffuse moderate to severe calcified plaque in the prox to mid LAD with possibly severe stenosis in mid LAD. FFR of LAD were prox 0.94, mid 0.92, distal 0.82, arguing against hemodynamically significant stenosis. Chest overread indicated appearance of the lungs suggested chronic indolent atypical infectious process such as myobacterium avium intracellulare (MAI) and aortic atherosclerosis. Pulmonology referral recommended but patient declined. Crestor and Aspirin were initiated.    Was last seen in clinic on 04/2021 where she was doing well from a CV standpoint.  Today, the patient states that she is unable to "out-walk" her friends, which she was able to do previously. This decrease in her exercise tolerance has been gradual over the past year. Especially while climbing uphill she has noticed more exhaustion and needs to stop sometimes.  During one of her 2-mile walks (37 minutes long), she needed to stop 4 times. If she is climbing uphill she needs to stop due to her shortness of breath and fatigue. She generally does well on flat ground, as she is able to push through the shortness of breath. When she exercises using the  treadmill, she usually starts at a 2.5 speed and a flat level. At these settings she will never develop shortness of breath. She is concerned about her symptoms given known LAD disease on coronary CTA. Notably, she is leaving for a vacation in 3 weeks. She initially stated she did not want testing before her trip but was amenable to undergo exercise NM stress to ensure no significant ischemia prior to traveling.  Also she presents a blood pressure log today, which is personally reviewed and shows average readings of 110s-120s/60s-70s. Her heart rate ranges in the 50s-70s.  Regarding her diet, she states that she is usually eating well but may have more sweets occasionally.  She denies any palpitations, or peripheral edema. No lightheadedness, headaches, syncope, orthopnea, or PND.  In her family, her father underwent an angioplasty.  Past Medical History:  Diagnosis Date   Anemia    Anxiety disorder    Basal cell carcinoma (BCC) of left upper arm 12/2015   Basal cell carcinoma (BCC) of right lower leg 04/2016   Borderline hypertension    CAD (coronary artery disease)    a. nonobst by cor CT 12/2018.   CIN II (cervical intraepithelial neoplasia II)    CIN  1   Depression    Eczema    Family history of premature CAD    History of basal cell carcinoma (BCC) excision    left upper arm 2017;   right lower leg 2018;  bilateral lower leg and hip area 2019;   inner right lower leg, outer right thigh, & upper outside left arm  09/ 2020   History of cervical dysplasia 2013   History of hereditary spherocytosis    s/p splenectomy in 1954   History of hyperparathyroidism    per pt yrs ago was put on mega dose of vit d which caused the hyperparathyroid resolved after stopping vit d   History of pelvic fracture 2013   MAI (mycobacterium avium-intracellulare) ([place])    ? possibly by CT 12/2018   Mild hyperlipidemia    Osteoporosis    PONV (postoperative nausea and vomiting)    S/P splenectomy     spherocytosis   Wears glasses    White coat syndrome without diagnosis of hypertension     Past Surgical History:  Procedure Laterality Date   ANKLE SURGERY  03/14/2015    CERVICAL CONIZATION W/BX N/A 12/13/2018   Procedure: CONIZATION CERVIX WITH BIOPSY;  Surgeon: Megan Salon, MD;  Location: [place];  Service: Gynecology;  Laterality: N/A;   CYSTOSCOPY N/A 10/18/2019   Procedure: CYSTOSCOPY;  Surgeon: Megan Salon, MD;  Location: [facility];  Service: Gynecology;  Laterality: N/A;   IR RADIOLOGY PERIPHERAL GUIDED IV START  01/05/2019   IR US GUIDE VASC ACCESS RIGHT  01/05/2019   KNEE SURGERY Left 1991   per pt retained hardward   LEEP N/A 12/13/2018   Procedure: possible LOOP ELECTROSURGICAL EXCISION PROCEDURE (LEEP);  Surgeon: Megan Salon, MD;  Location: [place];  Service: Gynecology;  Laterality: N/A;   ORIF ANKLE FRACTURE Left 03/14/2015   per pt retained hardware   SPLENECTOMY, TOTAL  1954   due to spherocytosis   TOTAL LAPAROSCOPIC HYSTERECTOMY WITH SALPINGECTOMY N/A 10/18/2019   Procedure: TOTAL LAPAROSCOPIC HYSTERECTOMY WITH BILATERAL SALPINGECTOMY AND BILATERAL OOPHORECTOMY;  Surgeon: Megan Salon, MD;  Location: [place];  Service: Gynecology;  Laterality: N/A;  BILATERAL SALPINGECTOMY POSS OOPHORECTOMY   TUBAL LIGATION Bilateral 1995    Current Medications: Current Meds  Medication Sig   ASPIRIN 81 PO Take 81 mg by mouth daily.   clonazePAM (KLONOPIN) 0.5 MG tablet Take 1 tablet (0.5 mg total) by mouth daily as needed for anxiety.   escitalopram (LEXAPRO) 10 MG tablet Take 1 tablet (10 mg total) by mouth daily.   halobetasol (ULTRAVATE) 0.05 % cream Apply 1 application topically daily as needed (irritation).    losartan (COZAAR) 25 MG tablet Take 1 tablet (25 mg total) by mouth daily.   methocarbamol (ROBAXIN) 500 MG tablet Take 1 tablet (500 mg total) by mouth 4 (four) times daily.   NONFORMULARY OR COMPOUNDED ITEM Vitamin E vaginal  suppositories 200u/ml.  One pv three times weekly.   Omega-3 Fatty Acids (FISH OIL PO) Take by mouth.   promethazine (PHENERGAN) 12.5 MG tablet Take 1 tablet (12.5 mg total) by mouth every 4 (four) hours as needed for nausea or vomiting.   rosuvastatin (CRESTOR) 20 MG tablet Take 1 tablet (20 mg total) by mouth daily.     Allergies:   Penicillins, Sulfa antibiotics, and Penicillin g   Social History   Socioeconomic History   Marital status: Widowed    Spouse name: Not on file   Number of children: Not on file   Years of education: Not on file   Highest education level: Not on file  Occupational History   Not on file  Tobacco Use   Smoking status: Never   Smokeless tobacco: Never  Vaping Use   Vaping Use: Never used  Substance and Sexual Activity   Alcohol use: Not Currently  Drug use: No   Sexual activity: Not Currently    Partners: Male    Birth control/protection: Surgical, Post-menopausal    Comment: BTL & hysterectomy  Other Topics Concern   Not on file  Social History Narrative   Not on file   Social Determinants of Health   Financial Resource Strain: Low Risk  (04/01/2021)   Overall Financial Resource Strain (CARDIA)    Difficulty of Paying Living Expenses: Not hard at all  Food Insecurity: No Food Insecurity (04/01/2021)   Hunger Vital Sign    Worried About Running Out of Food in the Last Year: Never true    [place] in the Last Year: Never true  Transportation Needs: No Transportation Needs (04/01/2021)   PRAPARE - Hydrologist (Medical): No    Lack of Transportation (Non-Medical): No  Physical Activity: Sufficiently Active (04/01/2021)   Exercise Vital Sign    Days of Exercise per Week: 4 days    Minutes of Exercise per Session: 50 min  Stress: No Stress Concern Present (04/01/2021)   [place]    Feeling of Stress : Not at all  Social Connections: Socially  Isolated (04/01/2021)   Social Connection and Isolation Panel [NHANES]    Frequency of Communication with Friends and Family: More than three times a week    Frequency of Social Gatherings with Friends and Family: Never    Attends Religious Services: Never    Marine scientist or Organizations: No    Attends Archivist Meetings: Never    Marital Status: Widowed     Family History: The patient's family history includes Colon cancer in her mother; Crohn's disease in her daughter; Heart disease in her father and sister; Heart failure in her father; Hypertension in her father; Osteoporosis in her mother; Skin cancer in her father. There is no history of Breast cancer.  ROS:   Please see the history of present illness.    Review of Systems  Constitutional:  Positive for malaise/fatigue. Negative for weight loss.  HENT:  Negative for congestion.   Eyes:  Negative for blurred vision.  Respiratory:  Positive for shortness of breath (Exertional). Negative for cough.   Cardiovascular:  Negative for chest pain, palpitations, orthopnea, claudication, leg swelling and PND.  Gastrointestinal:  Negative for heartburn and nausea.  Genitourinary:  Negative for dysuria and urgency.  Musculoskeletal:  Positive for myalgias. Negative for joint pain.  Skin:  Negative for itching and rash.  Neurological:  Negative for dizziness and headaches.  Endo/Heme/Allergies:  Does not bruise/bleed easily.  Psychiatric/Behavioral:  The patient is not nervous/anxious and does not have insomnia.    All other systems reviewed and are negative.  EKGs/Labs/Other Studies Reviewed:    The following studies were reviewed today:  Cardiac CTA 01/05/2019 IMPRESSION: 1. Coronary calcium score of 544. This was 22 percentile for age and sex matched control.   2. Normal coronary origin with right dominance.   3. Diffuse moderate to severe calcified plaque in the proximal to mid LAD with possibly severe stenosis  in the mid LAD. Additional analysis with CT FFR will be submitted.   FFR  1. Left Main:  No significant stenosis. 2. LAD: Proximal: 0.94, mid: 0.92, distal: 0.82. 3. LCX: No significant stenosis. 4. RCA: No significant stenosis.   IMPRESSION: 1. CT FFR analysis didn't show any significant stenosis. Aggressive medical management is recommended.   Non Cardiac  Read IMPRESSION: 1. The appearance of the lungs suggests chronic indolent atypical infectious process such as mycobacterium avium intracellulare (MAI). Outpatient referral to Pulmonology for further evaluation is suggested. 2. Aortic atherosclerosis.   Echo 11/24/18  1. Left ventricular ejection fraction, by visual estimation, is 60 to 65%. The left ventricle has normal function. Normal left ventricular size. There is no left ventricular hypertrophy.   2. Elevated left ventricular end-diastolic pressure.   3. Left ventricular diastolic Doppler parameters are consistent with impaired relaxation pattern of LV diastolic filling.   4. Global right ventricle has normal systolic function.The right ventricular size is normal. No increase in right ventricular wall thickness.   5. Left atrial size was normal.   6. Right atrial size was normal.   7. The mitral valve is normal in structure. No evidence of mitral valve regurgitation. No evidence of mitral stenosis.   8. The tricuspid valve is normal in structure. Tricuspid valve regurgitation is mild.   9. The aortic valve was not well visualized Aortic valve regurgitation was not visualized by color flow Doppler. Structurally normal aortic valve, with no evidence of sclerosis or stenosis.  10. The pulmonic valve was normal in structure. Pulmonic valve regurgitation is not visualized by color flow Doppler.  11. Normal pulmonary artery systolic pressure.  12. The inferior vena cava is normal in size with greater than 50% respiratory variability, suggesting right atrial pressure of 3 mmHg.     EKG:  EKG is personally reviewed. 11/07/2021:  Sinus bradycardia. First degree AV block. Diffuse T wave inversions unchanged from prior.    Recent Labs: 11/13/2020: ALT 15; BUN 12; Creatinine, Ser 0.65; Hemoglobin 12.9; Platelets 272.0; Potassium 4.3; Sodium 141; TSH 1.57   Recent Lipid Panel    Component Value Date/Time   CHOL 144 11/13/2020 1344   CHOL 135 03/25/2019 0829   TRIG 46.0 11/13/2020 1344   HDL 79.90 11/13/2020 1344   HDL 75 03/25/2019 0829   CHOLHDL 2 11/13/2020 1344   VLDL 9.2 11/13/2020 1344   LDLCALC 55 11/13/2020 1344   LDLCALC 67 11/04/2019 0955     Risk Assessment/Calculations:           Physical Exam:    VS:  BP 128/80   Pulse (!) 59   Ht '5\' 7"'$  (1.702 m)   Wt 138 lb 12.8 oz (63 kg)   LMP 02/24/2002   SpO2 97%   BMI 21.74 kg/m     Wt Readings from Last 3 Encounters:  11/08/21 138 lb 12.8 oz (63 kg)  06/18/21 134 lb (60.8 kg)  05/22/21 134 lb (60.8 kg)     GEN: Well nourished, well developed in no acute distress HEENT: Normal NECK: No JVD; No carotid bruits CARDIAC: RRR, 1/6 systolic murmur, no rubs or gallops RESPIRATORY:  Clear to auscultation without rales, wheezing or rhonchi  ABDOMEN: Soft, non-tender, non-distended MUSCULOSKELETAL:  No edema; No deformity  SKIN: Warm and dry NEUROLOGIC:  Alert and oriented x 3 PSYCHIATRIC:  Normal affect   ASSESSMENT:    1. SOB (shortness of breath)   2. Coronary artery disease of native artery of native heart with stable angina pectoris ([place])   3. Essential hypertension   4. Hyperlipidemia LDL goal <70   5. Coronary artery disease involving native coronary artery of native heart without angina pectoris     PLAN:    In order of problems listed above:  #CAD: #DOE: Coronary CTA with moderate-to-severe calcified plaque in the prox-mid LAD with negative FFR.  Ca score 544 (96%). Currently with increasing dyspnea on exertion and decreased exercise tolerance. Discussed this at length with the  patient and given symptoms and my concern for her traveling out of the country, she is amenable to proceed with exercise NM stress. If positive, she would be willing to proceed with cath at that time.  -Check exercise myoview; if ischemia present, she is willing to proceed with cath -Continue ASA '81mg'$  daily -Continue crestor '20mg'$  daily -Continue losartan '25mg'$  daily  #MAI: Noted on CTA in 12/2018. Declined Pulm referral.  #HTN: Well controlled at home and at goal <130/90 -Continue losartan '25mg'$  daily  #HLD: LDL well controlled at 55.  -Continue crestor '20mg'$  daily  Follow-up:  3 months.  Medication Adjustments/Labs and Tests Ordered: Current medicines are reviewed at length with the patient today.  Concerns regarding medicines are outlined above.   Orders Placed This Encounter  Procedures   MYOCARDIAL PERFUSION IMAGING   EKG 12-Lead   No orders of the defined types were placed in this encounter.  Patient Instructions  Medication Instructions:   Your physician recommends that you continue on your current medications as directed. Please refer to the Current Medication list given to you today.  *If you need a refill on your cardiac medications before your next appointment, please call your pharmacy*   Testing/Procedures:  Your physician has requested that you have en exercise stress myoview. For further information please visit [email or web address]. Please follow instruction sheet, as given.   Follow-Up:  3 MONTHS WITH AN EXTENDER IN THE OFFICE   Important Information About Sugar        I,Mathew Stumpf,acting as a scribe for Freada Bergeron, MD.,have documented all relevant documentation on the behalf of Freada Bergeron, MD,as directed by  Freada Bergeron, MD while in the presence of Freada Bergeron, MD.  I, Freada Bergeron, MD, have reviewed all documentation for this visit. The documentation on 11/08/21 for the exam, diagnosis, procedures, and  orders are all accurate and complete.    Signed, Freada Bergeron, MD  11/08/2021 11:38 AM    [place]

## 2021-11-08 ENCOUNTER — Encounter: Payer: Self-pay | Admitting: Cardiology

## 2021-11-08 ENCOUNTER — Encounter: Payer: Self-pay | Admitting: *Deleted

## 2021-11-08 ENCOUNTER — Ambulatory Visit: Payer: Medicare HMO | Attending: Cardiology | Admitting: Cardiology

## 2021-11-08 VITALS — BP 128/80 | HR 59 | Ht 67.0 in | Wt 138.8 lb

## 2021-11-08 DIAGNOSIS — R0602 Shortness of breath: Secondary | ICD-10-CM

## 2021-11-08 DIAGNOSIS — I10 Essential (primary) hypertension: Secondary | ICD-10-CM

## 2021-11-08 DIAGNOSIS — I2510 Atherosclerotic heart disease of native coronary artery without angina pectoris: Secondary | ICD-10-CM

## 2021-11-08 DIAGNOSIS — E785 Hyperlipidemia, unspecified: Secondary | ICD-10-CM

## 2021-11-08 DIAGNOSIS — I25118 Atherosclerotic heart disease of native coronary artery with other forms of angina pectoris: Secondary | ICD-10-CM

## 2021-11-08 NOTE — Patient Instructions (Signed)
Medication Instructions:   Your physician recommends that you continue on your current medications as directed. Please refer to the Current Medication list given to you today.  *If you need a refill on your cardiac medications before your next appointment, please call your pharmacy*   Testing/Procedures:  Your physician has requested that you have en exercise stress myoview. For further information please visit [email or web address]. Please follow instruction sheet, as given.   Follow-Up:  3 MONTHS WITH AN EXTENDER IN THE OFFICE   Important Information About Sugar

## 2021-11-10 ENCOUNTER — Other Ambulatory Visit: Payer: Self-pay | Admitting: Internal Medicine

## 2021-11-11 ENCOUNTER — Telehealth (HOSPITAL_COMMUNITY): Payer: Self-pay | Admitting: *Deleted

## 2021-11-11 NOTE — Telephone Encounter (Signed)
Left message on voicemail per DPR in reference to upcoming appointment scheduled on 11/18/2021 at 10:30 with detailed instructions given per Myocardial Perfusion Study Information Sheet for the test. LM to arrive 15 minutes early, and that it is imperative to arrive on time for appointment to keep from having the test rescheduled. If you need to cancel or reschedule your appointment, please call the office within 24 hours of your appointment. Failure to do so may result in a cancellation of your appointment, and a $50 no show fee. Phone number given for call back for any questions.

## 2021-11-18 ENCOUNTER — Ambulatory Visit (HOSPITAL_COMMUNITY): Payer: Medicare HMO | Attending: Cardiology

## 2021-11-18 ENCOUNTER — Encounter: Payer: Self-pay | Admitting: Internal Medicine

## 2021-11-18 DIAGNOSIS — R0602 Shortness of breath: Secondary | ICD-10-CM | POA: Diagnosis not present

## 2021-11-18 DIAGNOSIS — E785 Hyperlipidemia, unspecified: Secondary | ICD-10-CM | POA: Diagnosis not present

## 2021-11-18 DIAGNOSIS — I10 Essential (primary) hypertension: Secondary | ICD-10-CM | POA: Diagnosis not present

## 2021-11-18 DIAGNOSIS — I2510 Atherosclerotic heart disease of native coronary artery without angina pectoris: Secondary | ICD-10-CM

## 2021-11-18 DIAGNOSIS — I25118 Atherosclerotic heart disease of native coronary artery with other forms of angina pectoris: Secondary | ICD-10-CM

## 2021-11-18 MED ORDER — TECHNETIUM TC 99M TETROFOSMIN IV KIT
10.3000 | PACK | Freq: Once | INTRAVENOUS | Status: AC | PRN
  Administered 2021-11-18: 10.3 via INTRAVENOUS

## 2021-11-18 MED ORDER — TECHNETIUM TC 99M TETROFOSMIN IV KIT
30.8000 | PACK | Freq: Once | INTRAVENOUS | Status: AC | PRN
  Administered 2021-11-18: 30.8 via INTRAVENOUS

## 2021-11-18 NOTE — Progress Notes (Unsigned)
Subjective:    Patient ID: Alicia Wilkerson, female    DOB: 09-20-1950, 71 y.o.   MRN: [id number]      HPI Alicia Wilkerson is here for a Physical exam.   Over the summer had breast biopsy and a hernia repair.  Had a stress test yesterday - DOE with inclines.  Can walk on flat surfaces w/o SOB.  She is still awaiting the results  Has had increased stress recently due to stress test and medical appointments, tests.  She states her BP is controlled at home.  She denies the elevation is just related to the increased stress.   Medications and allergies reviewed with patient and updated if appropriate.  Current Outpatient Medications on File Prior to Visit  Medication Sig Dispense Refill   ASPIRIN 81 PO Take 81 mg by mouth daily.     escitalopram (LEXAPRO) 10 MG tablet TAKE ONE TABLET BY MOUTH ONE TIME DAILY 90 tablet 0   halobetasol (ULTRAVATE) 0.05 % cream Apply 1 application topically daily as needed (irritation).      losartan (COZAAR) 25 MG tablet Take 1 tablet (25 mg total) by mouth daily. 90 tablet 3   methocarbamol (ROBAXIN) 500 MG tablet Take 1 tablet (500 mg total) by mouth 4 (four) times daily. 30 tablet 0   NONFORMULARY OR COMPOUNDED ITEM Vitamin E vaginal suppositories 200u/ml.  One pv three times weekly. 36 each 3   Omega-3 Fatty Acids (FISH OIL PO) Take by mouth.     promethazine (PHENERGAN) 12.5 MG tablet Take 1 tablet (12.5 mg total) by mouth every 4 (four) hours as needed for nausea or vomiting. 20 tablet 0   rosuvastatin (CRESTOR) 20 MG tablet Take 1 tablet (20 mg total) by mouth daily. 90 tablet 2   No current facility-administered medications on file prior to visit.    Review of Systems  Constitutional:  Negative for fever.  Eyes:  Negative for visual disturbance.  Respiratory:  Positive for shortness of breath (with an incline). Negative for cough and wheezing.   Cardiovascular:  Negative for chest pain, palpitations and leg swelling.  Gastrointestinal:  Negative for  abdominal pain, blood in stool, constipation, diarrhea and nausea.       No gerd  Genitourinary:  Negative for dysuria.  Musculoskeletal:  Negative for arthralgias and back pain.  Skin:  Negative for rash (mild exzema).  Neurological:  Negative for light-headedness and headaches.  Psychiatric/Behavioral:  Negative for dysphoric mood. The patient is nervous/anxious.        Objective:   Vitals:   11/19/21 1304  BP: (!) 160/100  Pulse: (!) 59  Temp: 97.7 F (36.5 C)  SpO2: 98%   Filed Weights   11/19/21 1304  Weight: 139 lb (63 kg)   Body mass index is 21.77 kg/m.  BP Readings from Last 3 Encounters:  11/19/21 (!) 160/100  11/08/21 128/80  06/18/21 135/71    Wt Readings from Last 3 Encounters:  11/19/21 139 lb (63 kg)  11/18/21 138 lb (62.6 kg)  11/08/21 138 lb 12.8 oz (63 kg)       Physical Exam Constitutional: She appears well-developed and well-nourished. No distress.  HENT:  Head: Normocephalic and atraumatic.  Right Ear: External ear normal. Normal ear canal and TM Left Ear: External ear normal.  Normal ear canal and TM Mouth/Throat: Oropharynx is clear and moist.  Eyes: Conjunctivae normal.  Neck: Neck supple. No tracheal deviation present. No thyromegaly present.  No carotid bruit  Cardiovascular: Normal  rate, regular rhythm and normal heart sounds.   No murmur heard.  No edema. Pulmonary/Chest: Effort normal and breath sounds normal. No respiratory distress. She has no wheezes. She has no rales.  Breast: deferred   Abdominal: Soft. She exhibits no distension. There is no tenderness.  Lymphadenopathy: She has no cervical adenopathy.  Skin: Skin is warm and dry. She is not diaphoretic.  Psychiatric: She has a normal mood and affect. Her behavior is normal.     Lab Results  Component Value Date   WBC 5.8 11/13/2020   HGB 12.9 11/13/2020   HCT 35.7 (L) 11/13/2020   PLT 272.0 11/13/2020   GLUCOSE 91 11/13/2020   CHOL 144 11/13/2020   TRIG 46.0  11/13/2020   HDL 79.90 11/13/2020   LDLCALC 55 11/13/2020   ALT 15 11/13/2020   AST 24 11/13/2020   NA 141 11/13/2020   K 4.3 11/13/2020   CL 104 11/13/2020   CREATININE 0.65 11/13/2020   BUN 12 11/13/2020   CO2 29 11/13/2020   TSH 1.57 11/13/2020   HGBA1C 4.0 Repeated and verified X2. (L) 11/13/2020         Assessment & Plan:   Physical exam: Screening blood work  ordered Exercise  walking, some weights Weight  normal Substance abuse  none   Reviewed recommended immunizations.   Flu vaccine today   Health Maintenance  Topic Date Due   INFLUENZA VACCINE  09/24/2021   COVID-19 Vaccine (4 - Moderna risk series) 12/05/2021 (Originally 01/29/2021)   Zoster Vaccines- Shingrix (2 of 2) 02/18/2022 (Originally 05/13/2021)   DEXA SCAN  10/19/2028 (Originally 01/04/2017)   MAMMOGRAM  08/17/2023   COLONOSCOPY (Pts 45-72yr Insurance coverage will need to be confirmed)  07/18/2026   TETANUS/TDAP  08/22/2026   Pneumonia Vaccine 69 Years old  Completed   Hepatitis C Screening  Completed   HPV VACCINES  Aged Out          See Problem List for Assessment and Plan of chronic medical problems.

## 2021-11-18 NOTE — Patient Instructions (Addendum)
Flu immunization administered today.     Blood work was ordered.     Medications changes include :   scopolamine patches    Your prescription(s) have been sent to your pharmacy.     Return in about 1 year (around 11/20/2022) for Physical Exam.    Health Maintenance, Female Adopting a healthy lifestyle and getting preventive care are important in promoting health and wellness. Ask your health care provider about: The right schedule for you to have regular tests and exams. Things you can do on your own to prevent diseases and keep yourself healthy. What should I know about diet, weight, and exercise? Eat a healthy diet  Eat a diet that includes plenty of vegetables, fruits, low-fat dairy products, and lean protein. Do not eat a lot of foods that are high in solid fats, added sugars, or sodium. Maintain a healthy weight Body mass index (BMI) is used to identify weight problems. It estimates body fat based on height and weight. Your health care provider can help determine your BMI and help you achieve or maintain a healthy weight. Get regular exercise Get regular exercise. This is one of the most important things you can do for your health. Most adults should: Exercise for at least 150 minutes each week. The exercise should increase your heart rate and make you sweat (moderate-intensity exercise). Do strengthening exercises at least twice a week. This is in addition to the moderate-intensity exercise. Spend less time sitting. Even light physical activity can be beneficial. Watch cholesterol and blood lipids Have your blood tested for lipids and cholesterol at 69 years of age, then have this test every 5 years. Have your cholesterol levels checked more often if: Your lipid or cholesterol levels are high. You are older than 69 years of age. You are at high risk for heart disease. What should I know about cancer screening? Depending on your health history and family history,  you may need to have cancer screening at various ages. This may include screening for: Breast cancer. Cervical cancer. Colorectal cancer. Skin cancer. Lung cancer. What should I know about heart disease, diabetes, and high blood pressure? Blood pressure and heart disease High blood pressure causes heart disease and increases the risk of stroke. This is more likely to develop in people who have high blood pressure readings or are overweight. Have your blood pressure checked: Every 3-5 years if you are 48-39 years of age. Every year if you are 80 years old or older. Diabetes Have regular diabetes screenings. This checks your fasting blood sugar level. Have the screening done: Once every three years after age 67 if you are at a normal weight and have a low risk for diabetes. More often and at a younger age if you are overweight or have a high risk for diabetes. What should I know about preventing infection? Hepatitis B If you have a higher risk for hepatitis B, you should be screened for this virus. Talk with your health care provider to find out if you are at risk for hepatitis B infection. Hepatitis C Testing is recommended for: Everyone born from [birth year] through 1963. Anyone with known risk factors for hepatitis C. Sexually transmitted infections (STIs) Get screened for STIs, including gonorrhea and chlamydia, if: You are sexually active and are younger than 69 years of age. You are older than 69 years of age and your health care provider tells you that you are at risk for this type of infection. Your sexual activity  has changed since you were last screened, and you are at increased risk for chlamydia or gonorrhea. Ask your health care provider if you are at risk. Ask your health care provider about whether you are at high risk for HIV. Your health care provider may recommend a prescription medicine to help prevent HIV infection. If you choose to take medicine to prevent HIV, you should  first get tested for HIV. You should then be tested every 3 months for as long as you are taking the medicine. Pregnancy If you are about to stop having your period (premenopausal) and you may become pregnant, seek counseling before you get pregnant. Take 400 to 800 micrograms (mcg) of folic acid every day if you become pregnant. Ask for birth control (contraception) if you want to prevent pregnancy. Osteoporosis and menopause Osteoporosis is a disease in which the bones lose minerals and strength with aging. This can result in bone fractures. If you are 3 years old or older, or if you are at risk for osteoporosis and fractures, ask your health care provider if you should: Be screened for bone loss. Take a calcium or vitamin D supplement to lower your risk of fractures. Be given hormone replacement therapy (HRT) to treat symptoms of menopause. Follow these instructions at home: Alcohol use Do not drink alcohol if: Your health care provider tells you not to drink. You are pregnant, may be pregnant, or are planning to become pregnant. If you drink alcohol: Limit how much you have to: 0-1 drink a day. Know how much alcohol is in your drink. In the [place] one drink equals one 12 oz bottle of beer (355 mL), one 5 oz glass of wine (148 mL), or one 1 oz glass of hard liquor (44 mL). Lifestyle Do not use any products that contain nicotine or tobacco. These products include cigarettes, chewing tobacco, and vaping devices, such as e-cigarettes. If you need help quitting, ask your health care provider. Do not use street drugs. Do not share needles. Ask your health care provider for help if you need support or information about quitting drugs. General instructions Schedule regular health, dental, and eye exams. Stay current with your vaccines. Tell your health care provider if: You often feel depressed. You have ever been abused or do not feel safe at home. Summary Adopting a healthy lifestyle  and getting preventive care are important in promoting health and wellness. Follow your health care provider's instructions about healthy diet, exercising, and getting tested or screened for diseases. Follow your health care provider's instructions on monitoring your cholesterol and blood pressure. This information is not intended to replace advice given to you by your health care provider. Make sure you discuss any questions you have with your health care provider. Document Revised: 07/02/2020 Document Reviewed: 07/02/2020 Elsevier Patient Education  [place].

## 2021-11-19 ENCOUNTER — Other Ambulatory Visit (HOSPITAL_COMMUNITY)
Admission: RE | Admit: 2021-11-19 | Discharge: 2021-11-19 | Disposition: A | Discharge status: Home / Self Care | Payer: Medicare HMO | Source: Ambulatory Visit | Attending: Obstetrics & Gynecology | Admitting: Obstetrics & Gynecology

## 2021-11-19 ENCOUNTER — Ambulatory Visit (INDEPENDENT_AMBULATORY_CARE_PROVIDER_SITE_OTHER): Payer: Medicare HMO | Admitting: Internal Medicine

## 2021-11-19 ENCOUNTER — Ambulatory Visit (INDEPENDENT_AMBULATORY_CARE_PROVIDER_SITE_OTHER): Payer: Medicare HMO | Admitting: Obstetrics & Gynecology

## 2021-11-19 VITALS — BP 160/100 | HR 59 | Temp 97.7°F | Wt 139.0 lb

## 2021-11-19 VITALS — BP 169/70 | Ht 63.5 in | Wt 139.6 lb

## 2021-11-19 DIAGNOSIS — Z90710 Acquired absence of both cervix and uterus: Secondary | ICD-10-CM | POA: Diagnosis not present

## 2021-11-19 DIAGNOSIS — B977 Papillomavirus as the cause of diseases classified elsewhere: Secondary | ICD-10-CM | POA: Diagnosis present

## 2021-11-19 DIAGNOSIS — Z0000 Encounter for general adult medical examination without abnormal findings: Secondary | ICD-10-CM | POA: Diagnosis not present

## 2021-11-19 DIAGNOSIS — Z124 Encounter for screening for malignant neoplasm of cervix: Secondary | ICD-10-CM | POA: Diagnosis not present

## 2021-11-19 DIAGNOSIS — Z1151 Encounter for screening for human papillomavirus (HPV): Secondary | ICD-10-CM | POA: Diagnosis not present

## 2021-11-19 DIAGNOSIS — M62830 Muscle spasm of back: Secondary | ICD-10-CM

## 2021-11-19 DIAGNOSIS — I2510 Atherosclerotic heart disease of native coronary artery without angina pectoris: Secondary | ICD-10-CM | POA: Diagnosis not present

## 2021-11-19 DIAGNOSIS — I700 Atherosclerosis of aorta: Secondary | ICD-10-CM

## 2021-11-19 DIAGNOSIS — Z87410 Personal history of cervical dysplasia: Secondary | ICD-10-CM | POA: Insufficient documentation

## 2021-11-19 DIAGNOSIS — Z9189 Other specified personal risk factors, not elsewhere classified: Secondary | ICD-10-CM

## 2021-11-19 DIAGNOSIS — F419 Anxiety disorder, unspecified: Secondary | ICD-10-CM | POA: Diagnosis not present

## 2021-11-19 DIAGNOSIS — Z23 Encounter for immunization: Secondary | ICD-10-CM

## 2021-11-19 DIAGNOSIS — I10 Essential (primary) hypertension: Secondary | ICD-10-CM

## 2021-11-19 DIAGNOSIS — Z90722 Acquired absence of ovaries, bilateral: Secondary | ICD-10-CM

## 2021-11-19 DIAGNOSIS — R87610 Atypical squamous cells of undetermined significance on cytologic smear of cervix (ASC-US): Secondary | ICD-10-CM | POA: Diagnosis not present

## 2021-11-19 DIAGNOSIS — Z9079 Acquired absence of other genital organ(s): Secondary | ICD-10-CM | POA: Diagnosis not present

## 2021-11-19 DIAGNOSIS — R739 Hyperglycemia, unspecified: Secondary | ICD-10-CM

## 2021-11-19 DIAGNOSIS — E782 Mixed hyperlipidemia: Secondary | ICD-10-CM | POA: Diagnosis not present

## 2021-11-19 DIAGNOSIS — R69 Illness, unspecified: Secondary | ICD-10-CM | POA: Diagnosis not present

## 2021-11-19 LAB — MYOCARDIAL PERFUSION IMAGING
Angina Index: 0
Duke Treadmill Score: 5
Estimated workload: 7
Exercise duration (min): 5 min
Exercise duration (sec): 15 s
LV dias vol: 72 mL (ref 46–106)
LV sys vol: 16 mL
MPHR: 151 {beats}/min
Nuc Stress EF: 78 %
Peak HR: 136 {beats}/min
Percent HR: 90 %
Rest HR: 60 {beats}/min
Rest Nuclear Isotope Dose: 10.3 mCi
SDS: 2
SRS: 3
SSS: 5
ST Depression (mm): 0 mm
Stress Nuclear Isotope Dose: 30.8 mCi
TID: 0.96

## 2021-11-19 MED ORDER — SCOPOLAMINE 1 MG/3DAYS TD PT72: 1.0000 | MEDICATED_PATCH | TRANSDERMAL | 0 refills | Status: DC

## 2021-11-19 MED ORDER — CLONAZEPAM 0.5 MG PO TABS: 0.5000 mg | ORAL_TABLET | Freq: Every day | ORAL | 5 refills | Status: DC | PRN

## 2021-11-19 NOTE — Assessment & Plan Note (Addendum)
Chronic Blood pressure well controlled at home -- elevated here today due to stress CMP Continue losartan 25 mg daily

## 2021-11-19 NOTE — Progress Notes (Signed)
71 y.o. [id number] Widowed White or Caucasian female here for breast and pelvic exam.  I am also following her for history of recurrent cervical dysplasia and persistent HR HPV.  Has underdone hysterectomy as well but HPV still persistent.    Denies vaginal bleeding.  Has cardiac testing just done.  Has results and would like me to help her understand these.  Does have upcoming trip that she is very excited to go on.  Patient's last menstrual period was 02/24/2002.          Sexually active: Yes.    H/O STD:  h/o HPV  Health Maintenance: PCP:  Dr. Quay Burow.  Last wellness appt was today.  Did blood work today. Vaccines are up to date:  no Colonoscopy:  07/17/2021 MMG:  08/16/2021 Need additional images BMD:  01/05/2015 Last pap smear:  05/22/2021 Positive HPV ASC-US.   H/o abnormal pap smear:  yes    reports that she has never smoked. She has never used smokeless tobacco. She reports that she does not currently use alcohol. She reports that she does not use drugs.  Past Medical History:  Diagnosis Date   Anemia    Anxiety disorder    Basal cell carcinoma (BCC) of left upper arm 12/2015   Basal cell carcinoma (BCC) of right lower leg 04/2016   Borderline hypertension    CAD (coronary artery disease)    a. nonobst by cor CT 12/2018.   CIN II (cervical intraepithelial neoplasia II)    CIN  1   Depression    Eczema    Family history of premature CAD    History of basal cell carcinoma (BCC) excision    left upper arm 2017;   right lower leg 2018;  bilateral lower leg and hip area 2019;   inner right lower leg, outer right thigh, & upper outside left arm 09/ 2020   History of cervical dysplasia 2013   History of hereditary spherocytosis    s/p splenectomy in 1954   History of hyperparathyroidism    per pt yrs ago was put on mega dose of vit d which caused the hyperparathyroid resolved after stopping vit d   History of pelvic fracture 2013   MAI (mycobacterium avium-intracellulare)  ([place])    ? possibly by CT 12/2018   Mild hyperlipidemia    Osteoporosis    PONV (postoperative nausea and vomiting)    S/P splenectomy    spherocytosis   Wears glasses    White coat syndrome without diagnosis of hypertension     Past Surgical History:  Procedure Laterality Date   ANKLE SURGERY  03/14/2015    CERVICAL CONIZATION W/BX N/A 12/13/2018   Procedure: CONIZATION CERVIX WITH BIOPSY;  Surgeon: Megan Salon, MD;  Location: [facility];  Service: Gynecology;  Laterality: N/A;   CYSTOSCOPY N/A 10/18/2019   Procedure: CYSTOSCOPY;  Surgeon: Megan Salon, MD;  Location: [facility];  Service: Gynecology;  Laterality: N/A;   IR RADIOLOGY PERIPHERAL GUIDED IV START  01/05/2019   IR US GUIDE VASC ACCESS RIGHT  01/05/2019   KNEE SURGERY Left 1991   per pt retained hardward   LEEP N/A 12/13/2018   Procedure: possible LOOP ELECTROSURGICAL EXCISION PROCEDURE (LEEP);  Surgeon: Megan Salon, MD;  Location: [facility];  Service: Gynecology;  Laterality: N/A;   ORIF ANKLE FRACTURE Left 03/14/2015   per pt retained hardware   SPLENECTOMY, TOTAL  1954   due to spherocytosis   TOTAL LAPAROSCOPIC HYSTERECTOMY WITH  SALPINGECTOMY N/A 10/18/2019   Procedure: TOTAL LAPAROSCOPIC HYSTERECTOMY WITH BILATERAL SALPINGECTOMY AND BILATERAL OOPHORECTOMY;  Surgeon: Megan Salon, MD;  Location: [facility];  Service: Gynecology;  Laterality: N/A;  BILATERAL SALPINGECTOMY POSS OOPHORECTOMY   TUBAL LIGATION Bilateral 1995    Current Outpatient Medications  Medication Sig Dispense Refill   ASPIRIN 81 PO Take 81 mg by mouth daily.     clonazePAM (KLONOPIN) 0.5 MG tablet Take 1 tablet (0.5 mg total) by mouth daily as needed for anxiety. 30 tablet 5   escitalopram (LEXAPRO) 10 MG tablet TAKE ONE TABLET BY MOUTH ONE TIME DAILY 90 tablet 0   halobetasol (ULTRAVATE) 0.05 % cream Apply 1 application topically daily as needed (irritation).      losartan (COZAAR) 25 MG tablet Take 1 tablet (25  mg total) by mouth daily. 90 tablet 3   methocarbamol (ROBAXIN) 500 MG tablet Take 1 tablet (500 mg total) by mouth 4 (four) times daily. 30 tablet 0   NONFORMULARY OR COMPOUNDED ITEM Vitamin E vaginal suppositories 200u/ml.  One pv three times weekly. 36 each 3   Omega-3 Fatty Acids (FISH OIL PO) Take by mouth.     promethazine (PHENERGAN) 12.5 MG tablet Take 1 tablet (12.5 mg total) by mouth every 4 (four) hours as needed for nausea or vomiting. 20 tablet 0   rosuvastatin (CRESTOR) 20 MG tablet Take 1 tablet (20 mg total) by mouth daily. 90 tablet 2   scopolamine (TRANSDERM SCOP, 1.5 MG,) 1 MG/3DAYS Place 1 patch (1.5 mg total) onto the skin every 3 (three) days. 10 patch 0   No current facility-administered medications for this visit.    Family History  Problem Relation Age of Onset   Osteoporosis Mother    Colon cancer Mother        deceased   Heart failure Father    Skin cancer Father    Hypertension Father    Heart disease Father    Heart disease Sister    Crohn's disease Daughter    Breast cancer Neg Hx     Review of Systems  Constitutional: Negative.   Genitourinary: Negative.     Exam:   BP (!) 169/70 (BP Location: Right Arm, Patient Position: Sitting, Cuff Size: Large)   Ht 5' 3.5" (1.613 m) Comment: reported  Wt 139 lb 9.6 oz (63.3 kg)   LMP 02/24/2002   BMI 24.34 kg/m   Height: 5' 3.5" (161.3 cm) (reported)  General appearance: alert, cooperative and appears stated age Breasts: normal appearance, no masses or tenderness Abdomen: soft, non-tender; bowel sounds normal; no masses,  no organomegaly Lymph nodes: Cervical, supraclavicular, and axillary nodes normal.  No abnormal inguinal nodes palpated Neurologic: Grossly normal  Pelvic: External genitalia:  no lesions              Urethra:  normal appearing urethra with no masses, tenderness or lesions              Bartholins and Skenes: normal                 Vagina: normal appearing vagina with atrophic changes  and no discharge, no lesions              Cervix: absent              Pap taken: Yes.   Bimanual Exam:  Uterus:  uterus absent              Adnexa: normal adnexa  Rectovaginal: Confirms               Anus:  normal sphincter tone, no lesions  Chaperone, Octaviano Batty, CMA, was present for exam.  Assessment/Plan: 1. GYN exam for high-risk Medicare patient - Pap smear and HR HPV obtained today - Mammogram 08/16/2021 - Colonoscopy 07/17/2021 - Bone mineral density 01/05/2015.  Not interested in repeating as does not want treatment - lab work done done with Dr. Quay Burow - vaccines reviewed/updated  2. High risk HPV infection - Cytology - PAP( [place])  3. History of cervical dysplasia)  4. S/P total hysterectomy and bilateral salpingo-oophorectomy, 2021  5. Aortic atherosclerosis ([place]) - followed by Dr. Johney Frame  6. Primary hypertension

## 2021-11-19 NOTE — Assessment & Plan Note (Addendum)
Chronic Controlled, Stable-some increased stress recently, but overall she feels her anxiety is well controlled and happy with her current doses of medication Continue Lexapro 10 mg daily, clonazepam 0.5 mg daily as needed

## 2021-11-19 NOTE — Assessment & Plan Note (Addendum)
Chronic Following with cardiology Has been having DOE with inclines - had stress test yesterday On aspirin 81 mg daily, Crestor 20 mg daily Exercising regularly

## 2021-11-19 NOTE — Assessment & Plan Note (Signed)
Chronic Sugars have consistently been in the normal range by A1c CMP

## 2021-11-19 NOTE — Assessment & Plan Note (Signed)
Chronic Regular exercise and healthy diet encouraged Check lipid panel  Continue Crestor 20 mg daily 

## 2021-11-19 NOTE — Assessment & Plan Note (Signed)
Chronic Occasional back spasms Takes methocarbamol on a rare occasion, which works well-okay to continue as needed

## 2021-11-22 ENCOUNTER — Encounter (HOSPITAL_BASED_OUTPATIENT_CLINIC_OR_DEPARTMENT_OTHER): Payer: Self-pay | Admitting: Obstetrics & Gynecology

## 2021-11-27 ENCOUNTER — Other Ambulatory Visit: Payer: Medicare HMO

## 2021-11-27 ENCOUNTER — Other Ambulatory Visit (INDEPENDENT_AMBULATORY_CARE_PROVIDER_SITE_OTHER): Payer: Medicare HMO

## 2021-11-27 DIAGNOSIS — E782 Mixed hyperlipidemia: Secondary | ICD-10-CM

## 2021-11-27 DIAGNOSIS — R768 Other specified abnormal immunological findings in serum: Secondary | ICD-10-CM

## 2021-11-27 DIAGNOSIS — I10 Essential (primary) hypertension: Secondary | ICD-10-CM | POA: Diagnosis not present

## 2021-11-27 DIAGNOSIS — I2510 Atherosclerotic heart disease of native coronary artery without angina pectoris: Secondary | ICD-10-CM

## 2021-11-27 LAB — CYTOLOGY - PAP
Comment: NEGATIVE
Comment: NEGATIVE
Comment: NEGATIVE
Diagnosis: UNDETERMINED — AB
HPV 16: NEGATIVE
HPV 18 / 45: NEGATIVE
High risk HPV: POSITIVE — AB

## 2021-11-27 LAB — CBC WITH DIFFERENTIAL/PLATELET
Absolute Monocytes: 874 cells/uL (ref 200–950)
Basophils Absolute: 91 cells/uL (ref 0–200)
Basophils Relative: 1 %
Eosinophils Absolute: 319 cells/uL (ref 15–500)
Eosinophils Relative: 3.5 %
HCT: 34.9 % — ABNORMAL LOW (ref 35.0–45.0)
Hemoglobin: 12.7 g/dL (ref 11.7–15.5)
Lymphs Abs: 1383 cells/uL (ref 850–3900)
MCH: 35.6 pg — ABNORMAL HIGH (ref 27.0–33.0)
MCHC: 36.4 g/dL — ABNORMAL HIGH (ref 32.0–36.0)
MCV: 97.8 fL (ref 80.0–100.0)
MPV: 11.9 fL (ref 7.5–12.5)
Monocytes Relative: 9.6 %
Neutro Abs: 6434 cells/uL (ref 1500–7800)
Neutrophils Relative %: 70.7 %
Platelets: 317 10*3/uL (ref 140–400)
RBC: 3.57 10*6/uL — ABNORMAL LOW (ref 3.80–5.10)
RDW: 14.5 % (ref 11.0–15.0)
Total Lymphocyte: 15.2 %
WBC: 9.1 10*3/uL (ref 3.8–10.8)

## 2021-11-27 LAB — COMPREHENSIVE METABOLIC PANEL
ALT: 14 U/L (ref 0–35)
AST: 22 U/L (ref 0–37)
Albumin: 4.3 g/dL (ref 3.5–5.2)
Alkaline Phosphatase: 73 U/L (ref 39–117)
BUN: 16 mg/dL (ref 6–23)
CO2: 29 mEq/L (ref 19–32)
Calcium: 9.7 mg/dL (ref 8.4–10.5)
Chloride: 102 mEq/L (ref 96–112)
Creatinine, Ser: 0.8 mg/dL (ref 0.40–1.20)
GFR: 75.08 mL/min (ref >60.00)
Glucose, Bld: 98 mg/dL (ref 70–99)
Potassium: 4.5 mEq/L (ref 3.5–5.1)
Sodium: 137 mEq/L (ref 135–145)
Total Bilirubin: 1.2 mg/dL (ref 0.2–1.2)
Total Protein: 7.4 g/dL (ref 6.0–8.3)

## 2021-11-27 LAB — LIPID PANEL
Cholesterol: 139 mg/dL (ref 0–200)
HDL: 64.8 mg/dL (ref >39.00)
LDL Cholesterol: 65 mg/dL (ref 0–99)
NonHDL: 74.24
Total CHOL/HDL Ratio: 2
Triglycerides: 47 mg/dL (ref 0.0–149.0)
VLDL: 9.4 mg/dL (ref 0.0–40.0)

## 2021-11-27 LAB — TSH: TSH: 1.13 u[IU]/mL (ref 0.35–5.50)

## 2021-11-27 NOTE — Progress Notes (Unsigned)
cbc

## 2022-01-02 ENCOUNTER — Other Ambulatory Visit: Payer: Medicare HMO

## 2022-01-08 DIAGNOSIS — D2272 Melanocytic nevi of left lower limb, including hip: Secondary | ICD-10-CM | POA: Diagnosis not present

## 2022-01-08 DIAGNOSIS — Z85828 Personal history of other malignant neoplasm of skin: Secondary | ICD-10-CM | POA: Diagnosis not present

## 2022-01-08 DIAGNOSIS — D225 Melanocytic nevi of trunk: Secondary | ICD-10-CM | POA: Diagnosis not present

## 2022-01-08 DIAGNOSIS — L738 Other specified follicular disorders: Secondary | ICD-10-CM | POA: Diagnosis not present

## 2022-01-08 DIAGNOSIS — L821 Other seborrheic keratosis: Secondary | ICD-10-CM | POA: Diagnosis not present

## 2022-01-08 DIAGNOSIS — D2261 Melanocytic nevi of right upper limb, including shoulder: Secondary | ICD-10-CM | POA: Diagnosis not present

## 2022-01-08 DIAGNOSIS — D2271 Melanocytic nevi of right lower limb, including hip: Secondary | ICD-10-CM | POA: Diagnosis not present

## 2022-02-03 ENCOUNTER — Telehealth: Payer: Self-pay | Admitting: Cardiology

## 2022-02-03 NOTE — Telephone Encounter (Signed)
Pt is scheduled to see Nicholes Rough PA-C for this Friday 12/15 for her 3 month follow-up appt, as advised by Dr. Johney Frame at last [place].   Pt called and cancelled this appt, for she states she is doing incredible from a cardiac standpoint.   Pt is scheduled to see Dr. Johney Frame for her routine follow-up on 05/19/22.  Pt states she will keep her appt with Dr. Johney Frame in March but cancelled her 3 month with [facility] for this week.  Pt states she is doing great with zero cardiac complaints.  She states she went on a trip for about a month and did wonderful on the trip.  She states she walked around 6 miles a day on her trip and experienced no cardiac complaints like chest pain and sob.  She states she had zero issues with walking the 6 miles a day on her trip.   Pt just wanted to let Dr. Johney Frame know this and that she does not feel the need to come into the office to be seen this week, being she is doing so well from a heart perspective.   Pt aware that if symptoms reoccur between now and her appt with Dr. Johney Frame in March, she will call to inform us of this, so that we can bring her in for further evaluation at that time.   Pt aware that I will pass the good news along to Dr. Johney Frame, and keep [place] posted as needed in the interim.  Pt verbalized understanding and agrees with this plan.

## 2022-02-03 NOTE — Telephone Encounter (Signed)
Patient called to let Dr. Johney Frame know that she is feeling great and does not need to follow up now in December with PA-C.  She will see Dr. Johney Frame in March for a 6 mos follow up.

## 2022-02-04 ENCOUNTER — Other Ambulatory Visit: Payer: Self-pay | Admitting: Internal Medicine

## 2022-02-07 ENCOUNTER — Ambulatory Visit: Payer: Medicare HMO | Admitting: Physician Assistant

## 2022-02-23 ENCOUNTER — Other Ambulatory Visit (HOSPITAL_BASED_OUTPATIENT_CLINIC_OR_DEPARTMENT_OTHER): Payer: Medicare HMO | Admitting: Radiology

## 2022-02-23 ENCOUNTER — Emergency Department (HOSPITAL_BASED_OUTPATIENT_CLINIC_OR_DEPARTMENT_OTHER): Payer: Medicare HMO | Admitting: Radiology

## 2022-02-23 ENCOUNTER — Emergency Department (HOSPITAL_BASED_OUTPATIENT_CLINIC_OR_DEPARTMENT_OTHER)
Admission: EM | Admit: 2022-02-23 | Discharge: 2022-02-23 | Disposition: A | Discharge status: Home / Self Care | Payer: Medicare HMO | Attending: Emergency Medicine | Admitting: Emergency Medicine

## 2022-02-23 ENCOUNTER — Encounter (HOSPITAL_BASED_OUTPATIENT_CLINIC_OR_DEPARTMENT_OTHER): Payer: Self-pay | Admitting: Emergency Medicine

## 2022-02-23 ENCOUNTER — Other Ambulatory Visit: Payer: Self-pay

## 2022-02-23 DIAGNOSIS — Y92009 Unspecified place in unspecified non-institutional (private) residence as the place of occurrence of the external cause: Secondary | ICD-10-CM | POA: Insufficient documentation

## 2022-02-23 DIAGNOSIS — S52591A Other fractures of lower end of right radius, initial encounter for closed fracture: Secondary | ICD-10-CM | POA: Insufficient documentation

## 2022-02-23 DIAGNOSIS — M545 Low back pain, unspecified: Secondary | ICD-10-CM | POA: Insufficient documentation

## 2022-02-23 DIAGNOSIS — M7989 Other specified soft tissue disorders: Secondary | ICD-10-CM | POA: Diagnosis not present

## 2022-02-23 DIAGNOSIS — S63502A Unspecified sprain of left wrist, initial encounter: Secondary | ICD-10-CM | POA: Diagnosis not present

## 2022-02-23 DIAGNOSIS — S63501A Unspecified sprain of right wrist, initial encounter: Secondary | ICD-10-CM | POA: Diagnosis not present

## 2022-02-23 DIAGNOSIS — W19XXXA Unspecified fall, initial encounter: Secondary | ICD-10-CM

## 2022-02-23 DIAGNOSIS — Z7982 Long term (current) use of aspirin: Secondary | ICD-10-CM | POA: Diagnosis not present

## 2022-02-23 DIAGNOSIS — S62646A Nondisplaced fracture of proximal phalanx of right little finger, initial encounter for closed fracture: Secondary | ICD-10-CM | POA: Diagnosis not present

## 2022-02-23 DIAGNOSIS — S93601A Unspecified sprain of right foot, initial encounter: Secondary | ICD-10-CM | POA: Diagnosis not present

## 2022-02-23 DIAGNOSIS — S29011A Strain of muscle and tendon of front wall of thorax, initial encounter: Secondary | ICD-10-CM | POA: Diagnosis not present

## 2022-02-23 DIAGNOSIS — S62616A Displaced fracture of proximal phalanx of right little finger, initial encounter for closed fracture: Secondary | ICD-10-CM | POA: Diagnosis not present

## 2022-02-23 DIAGNOSIS — M25532 Pain in left wrist: Secondary | ICD-10-CM | POA: Insufficient documentation

## 2022-02-23 DIAGNOSIS — S6992XA Unspecified injury of left wrist, hand and finger(s), initial encounter: Secondary | ICD-10-CM | POA: Diagnosis present

## 2022-02-23 DIAGNOSIS — S62102A Fracture of unspecified carpal bone, left wrist, initial encounter for closed fracture: Secondary | ICD-10-CM

## 2022-02-23 DIAGNOSIS — S6292XA Unspecified fracture of left wrist and hand, initial encounter for closed fracture: Secondary | ICD-10-CM | POA: Diagnosis not present

## 2022-02-23 DIAGNOSIS — Z043 Encounter for examination and observation following other accident: Secondary | ICD-10-CM | POA: Diagnosis not present

## 2022-02-23 DIAGNOSIS — S52572A Other intraarticular fracture of lower end of left radius, initial encounter for closed fracture: Secondary | ICD-10-CM | POA: Diagnosis not present

## 2022-02-23 MED ORDER — OXYCODONE-ACETAMINOPHEN 5-325 MG PO TABS
1.0000 | ORAL_TABLET | Freq: Once | ORAL | Status: AC
  Administered 2022-02-23: 1 via ORAL
  Filled 2022-02-23: qty 1

## 2022-02-23 MED ORDER — OXYCODONE-ACETAMINOPHEN 5-325 MG PO TABS: 1.0000 | ORAL_TABLET | Freq: Four times a day (QID) | ORAL | 0 refills | Status: DC | PRN

## 2022-02-23 NOTE — Discharge Instructions (Signed)
1.  You may take 1 Percocet every 6 hours for pain control.  You may add 1 extra strength 500 mg acetaminophen tablet to the Percocet tablet to total 825 mg of acetaminophen per dose.  If you do not need a pain medication as strong as Percocet, you may take exclusively extra strength Tylenol every 6 hours as needed. 2.  Try to elevate your wrist and hand is much as possible.  Apply well wrapped ice pack.  Try to avoid getting your splint wet. 3.  Since you are previously a patient with [facility], call their office on the next business day to schedule a follow-up with their hand specialist.  You need reevaluation and treatment for your wrist fracture and for your finger fracture. 4.  It appears you have had a strain to your chest wall from your fall.  At this time there is no indication of a collapsed lung or other serious injury.  Return immediately if you develop sudden shortness of breath, worsening pain or other concerning changes. 5.  It is common after a fall or motor vehicle collision to get sore and stiff muscles and noticed new bruising in the 2 to 5 days after the injury.  If you notice these things, apply ice packs and you may also apply over-the-counter pain patches as needed.  If there are concerning changes, return to the emergency department for recheck.

## 2022-02-23 NOTE — ED Notes (Signed)
DC papers reviewed. No questions or concerns. No signs of distress. Pt assisted to wheelchair and out to lobby. Appropriate measures for safety taken. 

## 2022-02-23 NOTE — ED Triage Notes (Signed)
Pt via pov from home after a mechanical fall last night. Pt denies LOC; she did hit her mouth, but denies any other head injury. Pt c/o pain to her left wrist, right foot, right 5th finger, and right torso. Pt is wearing a brace on her left wrist; states there is swelling and bruising. Pt alert & oriented, nad noted.

## 2022-02-23 NOTE — ED Provider Notes (Signed)
[facility] EMERGENC[facility] DEPT Provider Note   CSN: [id number] Arrival date & time: 02/23/22  1315     History  Chief Complaint  Patient presents with   Alicia Wilkerson is a 71 y.o. female.  HPI Patient had mechanical fall yesterday.  She was backing up and did not notice a small elevation before curb.  This caused her to lose her balance.  She fell forward and broke her fall with outstretched arms.  He reports that she has a lot of pain in the left wrist.  It also radiates into the elbow and today was worse with the movement of the elbow.  She is having difficulty elevating the left arm above the level of her shoulder.  She also reports an area of swelling and pain at the right small finger and ulnar aspect of the hand.  She reports she can move it.  She reports it was broken once many years ago and it does not seem as immobile as it was at that time.  No head injury.  No loss of consciousness.  Patient does not take anticoagulants.  She denies any central neck pain.  She reports today she is also noted some pain in the center of her chest just to the right of the sternum.  It is with certain positions.  She reports there is a corresponding pain in her back.  She denies any difficulty breathing or shortness of breath.  No abdominal pain.  Lower extremities are functional.  She has been up and walking.  She reports she does have pain to the medial aspect of the right foot.  It is compensated by walking on the outer edge of the foot.  She has been ambulatory however at baseline gait with some pain.    Home Medications Prior to Admission medications   Medication Sig Start Date End Date Taking? Authorizing Provider  oxyCODONE-acetaminophen (PERCOCET) 5-325 MG tablet Take 1 tablet by mouth every 6 (six) hours as needed. 02/23/22  Yes Charlesetta Shanks, MD  ASPIRIN 81 PO Take 81 mg by mouth daily.    [provider]  clonazePAM (KLONOPIN) 0.5 MG tablet Take 1 tablet  (0.5 mg total) by mouth daily as needed for anxiety. 11/19/21   Binnie Rail, MD  escitalopram (LEXAPRO) 10 MG tablet TAKE ONE TABLET BY MOUTH ONE TIME DAILY 02/04/22   Binnie Rail, MD  halobetasol (ULTRAVATE) 0.05 % cream Apply 1 application topically daily as needed (irritation).     [provider]  losartan (COZAAR) 25 MG tablet Take 1 tablet (25 mg total) by mouth daily. 04/22/21   Freada Bergeron, MD  methocarbamol (ROBAXIN) 500 MG tablet Take 1 tablet (500 mg total) by mouth 4 (four) times daily. 12/21/20   Binnie Rail, MD  NONFORMULARY OR COMPOUNDED ITEM Vitamin E vaginal suppositories 200u/ml.  One pv three times weekly. 10/09/21   Megan Salon, MD  Omega-3 Fatty Acids (FISH OIL PO) Take by mouth.    [provider]  promethazine (PHENERGAN) 12.5 MG tablet Take 1 tablet (12.5 mg total) by mouth every 4 (four) hours as needed for nausea or vomiting. 06/18/21   Megan Salon, MD  rosuvastatin (CRESTOR) 20 MG tablet Take 1 tablet (20 mg total) by mouth daily. 07/19/21   Freada Bergeron, MD  scopolamine (TRANSDERM SCOP, 1.5 MG,) 1 MG/3DAYS Place 1 patch (1.5 mg total) onto the skin every 3 (three) days. 11/19/21   Burns,  Claudina Lick, MD      Allergies    Penicillins, Sulfa antibiotics, and Penicillin g    Review of Systems   Review of Systems  Physical Exam Updated Vital Signs BP (!) 161/75   Pulse 68   Temp 97.8 F (36.6 C)   Resp 18   Ht '5\' 3"'$  (1.6 m)   Wt 61.2 kg   LMP 02/24/2002   SpO2 98%   BMI 23.91 kg/m  Physical Exam Constitutional:      Comments: Alert nontoxic well in appearance.  Well-nourished well-developed.  HENT:     Head: Normocephalic and atraumatic.     Mouth/Throat:     Pharynx: Oropharynx is clear.     Comments: Minor chip to right incisor laterally.  Minor lower lip laceration that is already sealed and scabbed.  No significant associated swelling.  Airway is widely patent. Eyes:     Extraocular Movements: Extraocular  movements intact.  Neck:     Comments: No midline C-spine tenderness. Cardiovascular:     Rate and Rhythm: Normal rate and regular rhythm.  Pulmonary:     Effort: Pulmonary effort is normal.     Breath sounds: Normal breath sounds.     Comments: Focal tenderness to the right anterior chest wall at about the fifth and sixth ribs.  No crepitus. Chest:     Chest wall: No tenderness.  Abdominal:     General: There is no distension.     Palpations: Abdomen is soft.     Tenderness: There is no abdominal tenderness. There is no guarding.  Musculoskeletal:     Cervical back: Neck supple.     Comments: Pain and swelling of the left wrist.  No obvious deformity.  Pain with range of motion.  Hand is warm and dry.  No deformity of the digits on the left.  Pain with supination at the elbow on the left.  No evident effusion or deformity at the elbow.  Patient is able to flex and extend at the elbow but has some pain with supination.  Right upper extremity normal range of motion shoulder elbow and wrist.  Some ecchymoses and swelling at the fifth digit and over the fifth metacarpal.  Warm and dry without any skin injury associated.  Tenderness of the medial plantar surface of the right foot.  No evident deformity or swelling.  No deformity or swelling at the ankle or knee.  Skin:    General: Skin is warm and dry.  Neurological:     General: No focal deficit present.     Mental Status: She is oriented to person, place, and time.     Motor: No weakness.     Coordination: Coordination normal.  Psychiatric:        Mood and Affect: Mood normal.     ED Results / Procedures / Treatments   Labs (all labs ordered are listed, but only abnormal results are displayed) Labs Reviewed - No data to display  EKG None  Radiology DG Chest 2 View  Result Date: 02/23/2022 CLINICAL DATA:  Fall. EXAM: CHEST - 2 VIEW COMPARISON:  Chest two views 11/07/2016, cardiac CT 01/05/2019 FINDINGS: Cardiac silhouette and  mediastinal contours are within normal limits. Mild-to-moderate calcification within the aortic arch. Unchanged benign calcified granuloma within the lateral left mid lung. Mild chronic bilateral interstitial thickening is unchanged. Mild bibasilar bronchovascular crowding. No definite focal airspace opacity to indicate pneumonia. Mild thickening of one of the major fissures on lateral view, possible  mild fluid, however no definite pleural effusion is seen on frontal view. No pneumothorax. Moderate multilevel degenerative disc changes of the thoracic spine. IMPRESSION: Mild chronic bilateral interstitial thickening. Mild thickening of one of the major fissures on lateral view, possible mild fluid, however no definite pleural effusion is seen on frontal view. Electronically Signed   By: Yvonne Kendall M.D.   On: 02/23/2022 17:48   DG Foot Complete Right  Result Date: 02/23/2022 CLINICAL DATA:  Fall.  Swelling and bruising. EXAM: RIGHT FOOT COMPLETE - 3+ VIEW COMPARISON:  None Available. FINDINGS: Mildly decreased bone mineralization. Mild joint space narrowing of the interphalangeal joints diffusely. No acute fracture is seen. No dislocation. IMPRESSION: Mild osteoarthritis of the interphalangeal joints. No acute fracture. Electronically Signed   By: Yvonne Kendall M.D.   On: 02/23/2022 17:45   DG Finger Little Right  Result Date: 02/23/2022 CLINICAL DATA:  Right fifth finger trauma.  Fall last night. EXAM: RIGHT LITTLE FINGER 2+V COMPARISON:  None Available. FINDINGS: Mildly decreased bone mineralization. There is an acute fracture of the dorsal medial aspect of the proximal metaphysis of the proximal phalanx of the fifth finger, best seen on oblique view where there is 1 mm cortical step-off. Otherwise, no significant displacement. This is seen on frontal view as only minimal concavity of the medial cortex. Joint spaces are preserved. Minimal degenerative spurring at the dorsal base of the distal phalanx of  the fifth finger. No dislocation. IMPRESSION: Acute fracture of the dorsal medial aspect of the proximal metaphysis of the proximal phalanx of the fifth finger. Mild cortical step-off but otherwise no significant displacement. Electronically Signed   By: Yvonne Kendall M.D.   On: 02/23/2022 17:43   DG Forearm Left  Result Date: 02/23/2022 CLINICAL DATA:  Fall last night.  Left wrist pain. EXAM: LEFT WRIST - COMPLETE 3+ VIEW; LEFT FOREARM - 2 VIEW COMPARISON:  None Available. FINDINGS: Left wrist: There is a comminuted acute fracture of the distal radial metaphysis extending through the distal articular surface. Minimal angulation of some fracture components with up to approximately 3 mm diastasis. Fracture lines extend through the distal articular surface of the mid to lateral and far medial aspect of the distal radius. Mild triscaphe and thumb carpometacarpal joint space narrowing and peripheral osteophytosis. Left forearm: No additional acute fracture is seen within the more proximal radius or ulna. Normal alignment at the elbow. IMPRESSION: Comminuted intra-articular fracture of the distal radial metaphysis with minimal angulation and displacement. Electronically Signed   By: Yvonne Kendall M.D.   On: 02/23/2022 17:40   DG Wrist Complete Left  Result Date: 02/23/2022 CLINICAL DATA:  Fall last night.  Left wrist pain. EXAM: LEFT WRIST - COMPLETE 3+ VIEW; LEFT FOREARM - 2 VIEW COMPARISON:  None Available. FINDINGS: Left wrist: There is a comminuted acute fracture of the distal radial metaphysis extending through the distal articular surface. Minimal angulation of some fracture components with up to approximately 3 mm diastasis. Fracture lines extend through the distal articular surface of the mid to lateral and far medial aspect of the distal radius. Mild triscaphe and thumb carpometacarpal joint space narrowing and peripheral osteophytosis. Left forearm: No additional acute fracture is seen within the more  proximal radius or ulna. Normal alignment at the elbow. IMPRESSION: Comminuted intra-articular fracture of the distal radial metaphysis with minimal angulation and displacement. Electronically Signed   By: Yvonne Kendall M.D.   On: 02/23/2022 17:40    Procedures Procedures    Medications Ordered  in ED Medications  oxyCODONE-acetaminophen (PERCOCET/ROXICET) 5-325 MG per tablet 1 tablet (1 tablet Oral Given 02/23/22 1844)    ED Course/ Medical Decision Making/ A&P                           Medical Decision Making Amount and/or Complexity of Data Reviewed Radiology: ordered.  Risk Prescription drug management.  Patient had a mechanical fall yesterday.  She has multiple areas of discomfort after the fall, but is highly functional.  She is ambulatory and has normal mental status.  She does not take any anticoagulants.  She has a chipped tooth but no significant associated head injury, no mental status change.  At this time I do not think she needs CT scan of the head of the neck.  Will proceed with imaging for areas of swelling and pain with plain film x-rays.  Patient does not have symptoms of emergent intrathoracic or intra-abdominal injury.  I do not think CT scans are indicated at this time.  X-rays visually reviewed by myself and radiology interpretation reviewed.  Patient does have a distal radius fracture of the left wrist.  Minimal displacement.  Also a right fifth digit fracture without significant displacement.  Will have ED tech proceed with sugar-tong splint for the wrist fracture and static finger splint with buddy tape for the finger fracture.  I have reassessed after splint placement.  The patient is comfortable and feels improved.  Digits are warm and dry.  Splint care reviewed.  Patient describes some discomfort with certain movements with her chest wall.  Chest x-ray does not show any indication of pneumothorax or any acute changes.  Lungs are clear and symmetric.  Patient is  in no distress.  At this time consistent with chest wall strain.  We also discussed the possibility of occult rib fracture.  We discussed management with pain control.  Careful return precautions included in discharge instructions.  Patient discharged in good condition.  She is alert and nontoxic.  She has been given 1 dose of Percocet for wrist pain.  Mental status is clear.  Follow-up plan to go to [facility] for hand specialist.  She has previously been seen by this group and would like to return to them.         Final Clinical Impression(s) / ED Diagnoses Final diagnoses:  Fall, initial encounter  Closed fracture of left wrist, initial encounter  Closed nondisplaced fracture of proximal phalanx of right little finger, initial encounter  Sprain of right foot, initial encounter  Muscle strain of chest wall, initial encounter    Rx / DC Orders ED Discharge Orders          Ordered    oxyCODONE-acetaminophen (PERCOCET) 5-325 MG tablet  Every 6 hours PRN        02/23/22 1917              Charlesetta Shanks, MD 02/23/22 1926

## 2022-02-27 DIAGNOSIS — S52502A Unspecified fracture of the lower end of left radius, initial encounter for closed fracture: Secondary | ICD-10-CM | POA: Diagnosis not present

## 2022-02-27 DIAGNOSIS — S62646A Nondisplaced fracture of proximal phalanx of right little finger, initial encounter for closed fracture: Secondary | ICD-10-CM | POA: Diagnosis not present

## 2022-03-07 ENCOUNTER — Encounter: Payer: Self-pay | Admitting: Nurse Practitioner

## 2022-03-07 ENCOUNTER — Encounter (HOSPITAL_COMMUNITY): Payer: Self-pay | Admitting: Orthopedic Surgery

## 2022-03-07 ENCOUNTER — Telehealth: Payer: Self-pay | Admitting: *Deleted

## 2022-03-07 ENCOUNTER — Other Ambulatory Visit: Payer: Self-pay

## 2022-03-07 ENCOUNTER — Ambulatory Visit: Payer: Medicare HMO | Attending: Nurse Practitioner | Admitting: Nurse Practitioner

## 2022-03-07 DIAGNOSIS — Z01810 Encounter for preprocedural cardiovascular examination: Secondary | ICD-10-CM

## 2022-03-07 DIAGNOSIS — S62646A Nondisplaced fracture of proximal phalanx of right little finger, initial encounter for closed fracture: Secondary | ICD-10-CM | POA: Diagnosis not present

## 2022-03-07 DIAGNOSIS — S52502A Unspecified fracture of the lower end of left radius, initial encounter for closed fracture: Secondary | ICD-10-CM | POA: Diagnosis not present

## 2022-03-07 NOTE — Telephone Encounter (Signed)
   Pre-operative Risk Assessment    Patient Name: Alicia Wilkerson  DOB: 11/09/1950 MRN: [id number]      Request for Surgical Clearance    Procedure:   LEFT WRIST OPEN REDUCTION AND FIXATION AND REPAIR AS INDICATED  Date of Surgery:  Clearance 03/10/22                                 Surgeon:  DR. [facility] Surgeon's Group or Practice Name:  [facility] Phone number:  [phone] Fax number:  [phone]   Type of Clearance Requested:   - Medical  - Pharmacy:  Hold Aspirin NOT INDICATED HOW LONG   Type of Anesthesia:  Not Indicated   Additional requests/questions:    [name]   03/07/2022, 3:16 PM

## 2022-03-07 NOTE — Pre-Procedure Instructions (Signed)
PCP - Dr.Stacy Burns Cardiologist - Dr.Heather Johney Frame   PPM/ICD - pt denies Device Orders - n/a Rep Notified - n/a  EKG - 11/08/21 Stress Test - 11/18/21 ECHO - 11/23/21 Cardiac Cath - pt denies  Sleep Study/CPAP - pt denies  Diabetic- pt denies Fasting Blood Sugar -  Checks Blood Sugar _____ times a day  Last dose of GLP1 agonist-  pt denies GLP1 instructions: n/a  Blood Thinner Instructions:pt denies Aspirin Instructions:Follow your surgeon's instructions on when to stop Aspirin.  If no instructions were given by your surgeon then you will need to call the office to get those instructions.     ERAS Protcol - YES  COVID TEST- n/a   Anesthesia review: NO   Patient verbally denies any shortness of breath, fever, cough and chest pain during phone call     -------------  SDW INSTRUCTIONS given:   Your procedure is scheduled on January 15th.             Report to [facility] Main Entrance "A" at  12:30 pm, and check in at the Admitting office.             Call this number if you have problems the morning of surgery:             [phone]               Remember:             Do not eat  after midnight the night before your surgery  Clear liquids until 11:45 am.                          Take these medicines the morning of surgery with A SIP OF WATER  clonazePAM (KLONOPIN) prn promethazine (PHENERGAN) prn acetaminophen (TYLENOL) prn  Follow your surgeon's instructions on when to stop Aspirin.  If no instructions were given by your surgeon then you will need to call the office to get those instructions.     As of today, STOP taking any Aspirin (unless otherwise instructed by your surgeon) Aleve, Naproxen, Ibuprofen, Motrin, Advil, Goody's, BC's, all herbal medications, fish oil, and all vitamins.                       Do not wear jewelry, make up, or nail polish            Do not wear lotions, powders, perfumes/colognes, or deodorant.            Do not shave 48  hours prior to surgery.  Men may shave face and neck.            Do not bring valuables to the hospital.            [facility] is not responsible for any belongings or valuables.   Do NOT Smoke (Tobacco/Vaping) 24 hours prior to your procedure If you use a CPAP at night, you may bring all equipment for your overnight stay.   Contacts, glasses, dentures or partials may not be worn into surgery.      For patients admitted to the hospital, discharge time will be determined by your treatment team.   Patients discharged the day of surgery will not be allowed to drive home, and someone needs to stay with them for 24 hours.     Special instructions:   Alicia Wilkerson- Preparing For Surgery  Oral Hygiene is also  important to reduce your risk of infection.  Remember - BRUSH YOUR TEETH THE MORNING OF SURGERY WITH YOUR REGULAR TOOTHPASTE   Before surgery, you can play an important role. Because skin is not sterile, your skin needs to be as free of germs as possible. You can reduce the number of germs on your skin by washing with Dial (or any antibacterial) Soap before surgery.    Please do not use if you have an allergy to antibacterial soaps. If your skin becomes reddened/irritated stop using the Antibacterial Soap  Do not shave (including legs and underarms) for at least 48 hours prior to surgery. It is OK to shave your face.   Please follow these instructions carefully.              Shower the NIGHT BEFORE SURGERY and the MORNING OF SURGERY with DIAL Soap. Wash your body and hair with your normal shampoo/soap then rinse. Using a clean wash cloth wash from your neck down using the antibacterial soap, do not wash private area with the clean wash cloth.    Pat yourself dry with a CLEAN TOWEL.   Wear CLEAN PAJAMAS to bed the night before surgery.   Place CLEAN SHEETS on your bed the night of your surgery and DO NOT SLEEP WITH PETS.     Day of Surgery: Please shower morning of surgery using  antibacterial soap Wear Clean/Comfortable clothing the morning of surgery Do not apply any deodorants/lotions.   Remember to brush your teeth WITH YOUR REGULAR TOOTHPASTE.   Questions were answered. Patient verbalized understanding of instructions.

## 2022-03-07 NOTE — Telephone Encounter (Signed)
  Patient Consent for Virtual Visit        Alicia Wilkerson has provided verbal consent on 03/07/2022 for a virtual visit (video or telephone).   CONSENT FOR VIRTUAL VISIT FOR:  Alicia Wilkerson  By participating in this virtual visit I agree to the following:  I hereby voluntarily request, consent and authorize [facility] and its employed or contracted physicians, physician assistants, nurse practitioners or other licensed health care professionals (the Practitioner), to provide me with telemedicine health care services (the "Services") as deemed necessary by the treating Practitioner. I acknowledge and consent to receive the Services by the Practitioner via telemedicine. I understand that the telemedicine visit will involve communicating with the Practitioner through live audiovisual communication technology and the disclosure of certain medical information by electronic transmission. I acknowledge that I have been given the opportunity to request an in-person assessment or other available alternative prior to the telemedicine visit and am voluntarily participating in the telemedicine visit.  I understand that I have the right to withhold or withdraw my consent to the use of telemedicine in the course of my care at any time, without affecting my right to future care or treatment, and that the Practitioner or I may terminate the telemedicine visit at any time. I understand that I have the right to inspect all information obtained and/or recorded in the course of the telemedicine visit and may receive copies of available information for a reasonable fee.  I understand that some of the potential risks of receiving the Services via telemedicine include:  Delay or interruption in medical evaluation due to technological equipment failure or disruption; Information transmitted may not be sufficient (e.g. poor resolution of images) to allow for appropriate medical decision making by the  Practitioner; and/or  In rare instances, security protocols could fail, causing a breach of personal health information.  Furthermore, I acknowledge that it is my responsibility to provide information about my medical history, conditions and care that is complete and accurate to the best of my ability. I acknowledge that Practitioner's advice, recommendations, and/or decision may be based on factors not within their control, such as incomplete or inaccurate data provided by me or distortions of diagnostic images or specimens that may result from electronic transmissions. I understand that the practice of medicine is not an exact science and that Practitioner makes no warranties or guarantees regarding treatment outcomes. I acknowledge that a copy of this consent can be made available to me via my patient portal ([facility]), or I can request a printed copy by calling the office of [place].    I understand that my insurance will be billed for this visit.   I have read or had this consent read to me. I understand the contents of this consent, which adequately explains the benefits and risks of the Services being provided via telemedicine.  I have been provided ample opportunity to ask questions regarding this consent and the Services and have had my questions answered to my satisfaction. I give my informed consent for the services to be provided through the use of telemedicine in my medical care

## 2022-03-07 NOTE — Progress Notes (Signed)
Virtual Visit via Telephone Note   Because of Alicia Wilkerson's co-morbid illnesses, she is at least at moderate risk for complications without adequate follow up.  This format is felt to be most appropriate for this patient at this time.  The patient did not have access to video technology/had technical difficulties with video requiring transitioning to audio format only (telephone).  All issues noted in this document were discussed and addressed.  No physical exam could be performed with this format.  Please refer to the patient's chart for her consent to telehealth for [facility].  Evaluation Performed:  Preoperative cardiovascular risk assessment _____________   Date:  03/07/2022   Patient ID:  Alicia Wilkerson, DOB Sep 07, 1950, MRN [id number] Patient Location:  Home Provider location:   Office  Primary Care Provider:  Binnie Rail, MD Primary Cardiologist:  Freada Bergeron, MD  Chief Complaint / Patient Profile   71 y.o. y/o female with a h/o nonobstructive CAD on coronary CTA, hyperlipidemia, HTN who is pending open reduction internal fixation wrist fracture and presents today for telephonic preoperative cardiovascular risk assessment.  History of Present Illness    Alicia Wilkerson is a 72 y.o. female who presents via audio/video conferencing for a telehealth visit today.  Pt was last seen in cardiology clinic on 11/08/21 by Dr. Johney Frame.  At that time Alicia Wilkerson was doing well.  The patient is now pending procedure as outlined above. Since her last visit, she denies chest pain, shortness of breath, lower extremity edema, fatigue, palpitations, melena, hematuria, hemoptysis, diaphoresis, weakness, presyncope, syncope, orthopnea, and PND. Was walking up to 10 miles most days prior to her fall. She feels that symptoms reported to Dr. Johney Frame in September were heat related.   Past Medical History    Past Medical History:  Diagnosis Date   Anemia    Anxiety  disorder    Basal cell carcinoma (BCC) of left upper arm 12/2015   Basal cell carcinoma (BCC) of right lower leg 04/2016   Borderline hypertension    CAD (coronary artery disease)    a. nonobst by cor CT 12/2018.   CIN II (cervical intraepithelial neoplasia II)    CIN  1   Depression    Eczema    Family history of premature CAD    History of basal cell carcinoma (BCC) excision    left upper arm 2017;   right lower leg 2018;  bilateral lower leg and hip area 2019;   inner right lower leg, outer right thigh, & upper outside left arm 09/ 2020   History of cervical dysplasia 2013   History of hereditary spherocytosis    s/p splenectomy in 1954   History of hyperparathyroidism    per pt yrs ago was put on mega dose of vit d which caused the hyperparathyroid resolved after stopping vit d   History of pelvic fracture 2013   MAI (mycobacterium avium-intracellulare) ([place])    ? possibly by CT 12/2018   Mild hyperlipidemia    Osteoporosis    PONV (postoperative nausea and vomiting)    S/P splenectomy    spherocytosis   Wears glasses    White coat syndrome without diagnosis of hypertension    Past Surgical History:  Procedure Laterality Date   ANKLE SURGERY  03/14/2015    CERVICAL CONIZATION W/BX N/A 12/13/2018   Procedure: CONIZATION CERVIX WITH BIOPSY;  Surgeon: Megan Salon, MD;  Location: [place];  Service: Gynecology;  Laterality: N/A;  CYSTOSCOPY N/A 10/18/2019   Procedure: CYSTOSCOPY;  Surgeon: Megan Salon, MD;  Location: [facility];  Service: Gynecology;  Laterality: N/A;   IR RADIOLOGY PERIPHERAL GUIDED IV START  01/05/2019   IR US GUIDE VASC ACCESS RIGHT  01/05/2019   KNEE SURGERY Left 1991   per pt retained hardward   LEEP N/A 12/13/2018   Procedure: possible LOOP ELECTROSURGICAL EXCISION PROCEDURE (LEEP);  Surgeon: Megan Salon, MD;  Location: [place];  Service: Gynecology;  Laterality: N/A;   ORIF ANKLE FRACTURE Left 03/14/2015   per pt retained hardware    SPLENECTOMY, TOTAL  1954   due to spherocytosis   TOTAL LAPAROSCOPIC HYSTERECTOMY WITH SALPINGECTOMY N/A 10/18/2019   Procedure: TOTAL LAPAROSCOPIC HYSTERECTOMY WITH BILATERAL SALPINGECTOMY AND BILATERAL OOPHORECTOMY;  Surgeon: Megan Salon, MD;  Location: [place];  Service: Gynecology;  Laterality: N/A;  BILATERAL SALPINGECTOMY POSS OOPHORECTOMY   TUBAL LIGATION Bilateral 1995    Allergies  Allergies  Allergen Reactions   Penicillins Rash    Did it involve swelling of the face/tongue/throat, SOB, or low BP? No Did it involve sudden or severe rash/hives, skin peeling, or any reaction on the inside of your mouth or nose?  #  #  #  YES  #  #  #  Did you need to seek medical attention at a hospital or doctor's office? #  #  #  YES  #  #  #  When did it last happen?   years ago    If all above answers are "NO", may proceed with cephalosporin use.    Sulfa Antibiotics Hives and Other (See Comments)   Penicillin G Other (See Comments)    Home Medications    Prior to Admission medications   Medication Sig Start Date End Date Taking? Authorizing Provider  ASPIRIN 81 PO Take 81 mg by mouth daily.    [provider]  clonazePAM (KLONOPIN) 0.5 MG tablet Take 1 tablet (0.5 mg total) by mouth daily as needed for anxiety. 11/19/21   Binnie Rail, MD  escitalopram (LEXAPRO) 10 MG tablet TAKE ONE TABLET BY MOUTH ONE TIME DAILY 02/04/22   Binnie Rail, MD  halobetasol (ULTRAVATE) 0.05 % cream Apply 1 application topically daily as needed (irritation).     [provider]  losartan (COZAAR) 25 MG tablet Take 1 tablet (25 mg total) by mouth daily. 04/22/21   Freada Bergeron, MD  methocarbamol (ROBAXIN) 500 MG tablet Take 1 tablet (500 mg total) by mouth 4 (four) times daily. 12/21/20   Binnie Rail, MD  NONFORMULARY OR COMPOUNDED ITEM Vitamin E vaginal suppositories 200u/ml.  One pv three times weekly. 10/09/21   Megan Salon, MD  Omega-3 Fatty Acids  (FISH OIL PO) Take by mouth.    [provider]  oxyCODONE-acetaminophen (PERCOCET) 5-325 MG tablet Take 1 tablet by mouth every 6 (six) hours as needed. 02/23/22   Charlesetta Shanks, MD  promethazine (PHENERGAN) 12.5 MG tablet Take 1 tablet (12.5 mg total) by mouth every 4 (four) hours as needed for nausea or vomiting. 06/18/21   Megan Salon, MD  rosuvastatin (CRESTOR) 20 MG tablet Take 1 tablet (20 mg total) by mouth daily. 07/19/21   Freada Bergeron, MD  scopolamine (TRANSDERM SCOP, 1.5 MG,) 1 MG/3DAYS Place 1 patch (1.5 mg total) onto the skin every 3 (three) days. 11/19/21   Binnie Rail, MD    Physical Exam    Vital Signs:  Alicia Wilkerson does not have vital signs available for review today.  Given telephonic nature of communication, physical exam is limited. AAOx3. NAD. Normal affect.  Speech and respirations are unlabored.  Accessory Clinical Findings    None  Assessment & Plan    1.  Preoperative Cardiovascular Risk Assessment: The patient is doing well from a cardiac perspective. Therefore, based on ACC/AHA guidelines, the patient would be at acceptable risk for the planned procedure without further cardiovascular testing. According to the Revised Cardiac Risk Index (RCRI), her Perioperative Risk of Major Cardiac Event is (%): 0.9 Functional Capacity in METs is: 7.59 according to the Duke Activity Status Index (DASI).   The patient was advised that if she develops new symptoms prior to surgery to contact our office to arrange for a follow-up visit, and she verbalized understanding.  She may hold aspirin for 5-7 days prior to surgery (took last dose 03/06/22) and should resume when hemodynamically stable postoperatively.   A copy of this note will be routed to requesting surgeon.  Time:   Today, I have spent 10 minutes with the patient with telehealth technology discussing medical history, symptoms, and management plan.     Emmaline Life,  NP-C  03/07/2022, 3:59 PM [address] Office [phone] Fax [phone]

## 2022-03-09 NOTE — H&P (Signed)
Alicia Wilkerson is an 72 y.o. female.   Chief Complaint: LEFT DISTAL RADIUS FRACTURE HPI: PT SEEN EVALUATE IN OFFICE PT WITH CLOSED LEFT DISTAL RADIUS FRACTURE PT HERE FOR SURGERY ON LEFT WRIST NO PRIOR SURGERY TO LEFT WRIST  Past Medical History:  Diagnosis Date   Anemia    Anxiety disorder    Basal cell carcinoma (BCC) of left upper arm 12/2015   Basal cell carcinoma (BCC) of right lower leg 04/2016   Borderline hypertension    CAD (coronary artery disease)    a. nonobst by cor CT 12/2018.   CIN II (cervical intraepithelial neoplasia II)    CIN  1   Depression    Eczema    Family history of premature CAD    History of basal cell carcinoma (BCC) excision    left upper arm 2017;   right lower leg 2018;  bilateral lower leg and hip area 2019;   inner right lower leg, outer right thigh, & upper outside left arm 09/ 2020   History of cervical dysplasia 2013   History of hereditary spherocytosis    s/p splenectomy in 1954   History of hyperparathyroidism    per pt yrs ago was put on mega dose of vit d which caused the hyperparathyroid resolved after stopping vit d   History of pelvic fracture 2013   MAI (mycobacterium avium-intracellulare) ([place])    ? possibly by CT 12/2018   Mild hyperlipidemia    Osteoporosis    PONV (postoperative nausea and vomiting)    S/P splenectomy    spherocytosis   Wears glasses    White coat syndrome without diagnosis of hypertension     Past Surgical History:  Procedure Laterality Date   ANKLE SURGERY  03/14/2015    CERVICAL CONIZATION W/BX N/A 12/13/2018   Procedure: CONIZATION CERVIX WITH BIOPSY;  Surgeon: Megan Salon, MD;  Location: [place];  Service: Gynecology;  Laterality: N/A;   CYSTOSCOPY N/A 10/18/2019   Procedure: CYSTOSCOPY;  Surgeon: Megan Salon, MD;  Location: [facility];  Service: Gynecology;  Laterality: N/A;   IR RADIOLOGY PERIPHERAL GUIDED IV START  01/05/2019   IR US GUIDE VASC ACCESS RIGHT  01/05/2019   KNEE  SURGERY Left 1991   per pt retained hardward   LEEP N/A 12/13/2018   Procedure: possible LOOP ELECTROSURGICAL EXCISION PROCEDURE (LEEP);  Surgeon: Megan Salon, MD;  Location: [place];  Service: Gynecology;  Laterality: N/A;   ORIF ANKLE FRACTURE Left 03/14/2015   per pt retained hardware   SPLENECTOMY, TOTAL  1954   due to spherocytosis   TOTAL LAPAROSCOPIC HYSTERECTOMY WITH SALPINGECTOMY N/A 10/18/2019   Procedure: TOTAL LAPAROSCOPIC HYSTERECTOMY WITH BILATERAL SALPINGECTOMY AND BILATERAL OOPHORECTOMY;  Surgeon: Megan Salon, MD;  Location: [place];  Service: Gynecology;  Laterality: N/A;  BILATERAL SALPINGECTOMY POSS OOPHORECTOMY   TUBAL LIGATION Bilateral 1995    Family History  Problem Relation Age of Onset   Osteoporosis Mother    Colon cancer Mother        deceased   Heart failure Father    Skin cancer Father    Hypertension Father    Heart disease Father    Heart disease Sister    Crohn's disease Daughter    Breast cancer Neg Hx    Social History:  reports that she has never smoked. She has never used smokeless tobacco. She reports that she does not currently use alcohol. She reports that she does not  use drugs.  Allergies:  Allergies  Allergen Reactions   Penicillins Rash    Did it involve swelling of the face/tongue/throat, SOB, or low BP? No Did it involve sudden or severe rash/hives, skin peeling, or any reaction on the inside of your mouth or nose?  #  #  #  YES  #  #  #  Did you need to seek medical attention at a hospital or doctor's office? #  #  #  YES  #  #  #  When did it last happen?   years ago    If all above answers are "NO", may proceed with cephalosporin use.    Sulfa Antibiotics Hives and Other (See Comments)   Nickel Hives   Penicillin G Other (See Comments)    No medications prior to admission.    No results found for this or any previous visit (from the past 48 hour(s)). No results found.  ROS: NO RECENT ILLNESSES OR  HOSPITALIZATIONS  Last menstrual period 02/24/2002. Physical Exam  General Appearance:  Alert, cooperative, no distress, appears stated age  Head:  Normocephalic, without obvious abnormality, atraumatic  Eyes:  Pupils equal, conjunctiva/corneas clear,         Throat: Lips, mucosa, and tongue normal; teeth and gums normal  Neck: No visible masses     Lungs:   respirations unlabored  Chest Wall:  No tenderness or deformity  Heart:  Regular rate and rhythm,  Abdomen:   Soft, non-tender,         Extremities: LEFT HAND: SKIN INTACT, FINGERS WARM WELL PERFUSED GOOD DIGITAL MOTION NVI LEFT HAND  Pulses: 2+ and symmetric  Skin: Skin color, texture, turgor normal, no rashes or lesions     Neurologic: Normal     Assessment/Plan LEFT WRIST INTRA-ARTICULAR DISTAL RADIUS FRACTURE  LEFT WRIST OPEN REDUCTION AND INTERNAL FIXATION AND REPAIR AS INDICATED  R/B/A DISCUSSED WITH PT IN OFFICE.  PT VOICED UNDERSTANDING OF PLAN CONSENT SIGNED DAY OF SURGERY PT SEEN AND EXAMINED PRIOR TO OPERATIVE PROCEDURE/DAY OF SURGERY SITE MARKED. QUESTIONS ANSWERED WILL GO HOME FOLLOWING SURGERY  WE ARE PLANNING SURGERY FOR YOUR UPPER EXTREMITY. THE RISKS AND BENEFITS OF SURGERY INCLUDE BUT NOT LIMITED TO BLEEDING INFECTION, DAMAGE TO NEARBY NERVES ARTERIES TENDONS, FAILURE OF SURGERY TO ACCOMPLISH ITS INTENDED GOALS, PERSISTENT SYMPTOMS AND NEED FOR FURTHER SURGICAL INTERVENTION. WITH THIS IN MIND WE WILL PROCEED. I HAVE DISCUSSED WITH THE PATIENT THE PRE AND POSTOPERATIVE REGIMEN AND THE DOS AND DON'TS. PT VOICED UNDERSTANDING AND INFORMED CONSENT SIGNED.   [name] 03/09/2022, 8:17 PM

## 2022-03-10 ENCOUNTER — Ambulatory Visit (HOSPITAL_BASED_OUTPATIENT_CLINIC_OR_DEPARTMENT_OTHER): Payer: Medicare HMO | Admitting: Anesthesiology

## 2022-03-10 ENCOUNTER — Ambulatory Visit (HOSPITAL_COMMUNITY)
Admission: RE | Admit: 2022-03-10 | Discharge: 2022-03-10 | Disposition: A | Discharge status: Home / Self Care | Payer: Medicare HMO | Attending: Orthopedic Surgery | Admitting: Orthopedic Surgery

## 2022-03-10 ENCOUNTER — Ambulatory Visit (HOSPITAL_COMMUNITY): Payer: Medicare HMO | Admitting: Anesthesiology

## 2022-03-10 ENCOUNTER — Other Ambulatory Visit: Payer: Self-pay

## 2022-03-10 ENCOUNTER — Encounter (HOSPITAL_COMMUNITY): Payer: Self-pay | Admitting: Orthopedic Surgery

## 2022-03-10 ENCOUNTER — Encounter (HOSPITAL_COMMUNITY)
Admission: RE | Disposition: A | Discharge status: Home / Self Care | Payer: Self-pay | Source: Home / Self Care | Attending: Orthopedic Surgery

## 2022-03-10 ENCOUNTER — Ambulatory Visit (HOSPITAL_COMMUNITY): Payer: Medicare HMO

## 2022-03-10 DIAGNOSIS — I2510 Atherosclerotic heart disease of native coronary artery without angina pectoris: Secondary | ICD-10-CM | POA: Diagnosis not present

## 2022-03-10 DIAGNOSIS — I10 Essential (primary) hypertension: Secondary | ICD-10-CM | POA: Insufficient documentation

## 2022-03-10 DIAGNOSIS — X58XXXA Exposure to other specified factors, initial encounter: Secondary | ICD-10-CM | POA: Diagnosis not present

## 2022-03-10 DIAGNOSIS — S52572A Other intraarticular fracture of lower end of left radius, initial encounter for closed fracture: Secondary | ICD-10-CM | POA: Diagnosis not present

## 2022-03-10 DIAGNOSIS — R69 Illness, unspecified: Secondary | ICD-10-CM | POA: Diagnosis not present

## 2022-03-10 DIAGNOSIS — F418 Other specified anxiety disorders: Secondary | ICD-10-CM

## 2022-03-10 DIAGNOSIS — S52502A Unspecified fracture of the lower end of left radius, initial encounter for closed fracture: Secondary | ICD-10-CM

## 2022-03-10 HISTORY — PX: ORIF WRIST FRACTURE: SHX2133

## 2022-03-10 LAB — CBC
HCT: 39.8 % (ref 36.0–46.0)
Hemoglobin: 13.7 g/dL (ref 12.0–15.0)
MCH: 34.6 pg — ABNORMAL HIGH (ref 26.0–34.0)
MCHC: 34.4 g/dL (ref 30.0–36.0)
MCV: 100.5 fL — ABNORMAL HIGH (ref 80.0–100.0)
Platelets: 428 10*3/uL — ABNORMAL HIGH (ref 150–400)
RBC: 3.96 MIL/uL (ref 3.87–5.11)
RDW: 16.9 % — ABNORMAL HIGH (ref 11.5–15.5)
WBC: 6.8 10*3/uL (ref 4.0–10.5)
nRBC: 0 % (ref 0.0–0.2)

## 2022-03-10 LAB — BASIC METABOLIC PANEL
Anion gap: 12 (ref 5–15)
BUN: 11 mg/dL (ref 8–23)
CO2: 22 mmol/L (ref 22–32)
Calcium: 9.4 mg/dL (ref 8.9–10.3)
Chloride: 103 mmol/L (ref 98–111)
Creatinine, Ser: 0.78 mg/dL (ref 0.44–1.00)
GFR, Estimated: >60 mL/min (ref >60)
Glucose, Bld: 103 mg/dL — ABNORMAL HIGH (ref 70–99)
Potassium: 4.1 mmol/L (ref 3.5–5.1)
Sodium: 137 mmol/L (ref 135–145)

## 2022-03-10 SURGERY — OPEN REDUCTION INTERNAL FIXATION (ORIF) WRIST FRACTURE
Anesthesia: Monitor Anesthesia Care | Site: Wrist | Laterality: Left

## 2022-03-10 MED ORDER — CLONIDINE HCL (ANALGESIA) 100 MCG/ML EP SOLN
EPIDURAL | Status: DC | PRN
  Administered 2022-03-10: 100 ug

## 2022-03-10 MED ORDER — MIDAZOLAM HCL 2 MG/2ML IJ SOLN
INTRAMUSCULAR | Status: AC
  Administered 2022-03-10: 1 mg via INTRAVENOUS
  Filled 2022-03-10: qty 2

## 2022-03-10 MED ORDER — MIDAZOLAM HCL 2 MG/2ML IJ SOLN: 1.0000 mg | Freq: Once | INTRAMUSCULAR | Status: AC

## 2022-03-10 MED ORDER — OXYCODONE HCL 5 MG PO TABS: 5.0000 mg | ORAL_TABLET | Freq: Once | ORAL | Status: DC | PRN

## 2022-03-10 MED ORDER — CHLORHEXIDINE GLUCONATE 0.12 % MT SOLN
15.0000 mL | Freq: Once | OROMUCOSAL | Status: DC
  Filled 2022-03-10: qty 15

## 2022-03-10 MED ORDER — LIDOCAINE 2% (20 MG/ML) 5 ML SYRINGE
INTRAMUSCULAR | Status: DC | PRN
  Administered 2022-03-10: 30 mg via INTRAVENOUS

## 2022-03-10 MED ORDER — FENTANYL CITRATE (PF) 100 MCG/2ML IJ SOLN: 50.0000 ug | Freq: Once | INTRAMUSCULAR | Status: AC

## 2022-03-10 MED ORDER — FENTANYL CITRATE (PF) 100 MCG/2ML IJ SOLN: 25.0000 ug | INTRAMUSCULAR | Status: DC | PRN

## 2022-03-10 MED ORDER — PROPOFOL 10 MG/ML IV BOLUS
INTRAVENOUS | Status: DC | PRN
  Administered 2022-03-10 (×2): 25 mg via INTRAVENOUS

## 2022-03-10 MED ORDER — PROPOFOL 500 MG/50ML IV EMUL
INTRAVENOUS | Status: DC | PRN
  Administered 2022-03-10: 85 ug/kg/min via INTRAVENOUS

## 2022-03-10 MED ORDER — CEFAZOLIN SODIUM-DEXTROSE 2-4 GM/100ML-% IV SOLN
2.0000 g | INTRAVENOUS | Status: AC
  Administered 2022-03-10: 2 g via INTRAVENOUS
  Filled 2022-03-10: qty 100

## 2022-03-10 MED ORDER — ORAL CARE MOUTH RINSE: 15.0000 mL | Freq: Once | OROMUCOSAL | Status: DC

## 2022-03-10 MED ORDER — FENTANYL CITRATE (PF) 100 MCG/2ML IJ SOLN
INTRAMUSCULAR | Status: AC
  Administered 2022-03-10: 50 ug via INTRAVENOUS
  Filled 2022-03-10: qty 2

## 2022-03-10 MED ORDER — 0.9 % SODIUM CHLORIDE (POUR BTL) OPTIME
TOPICAL | Status: DC | PRN
  Administered 2022-03-10: 1000 mL

## 2022-03-10 MED ORDER — LACTATED RINGERS IV SOLN: INTRAVENOUS | Status: DC

## 2022-03-10 MED ORDER — OXYCODONE-ACETAMINOPHEN 5-325 MG PO TABS: 1.0000 | ORAL_TABLET | ORAL | 0 refills | Status: AC | PRN

## 2022-03-10 MED ORDER — OXYCODONE HCL 5 MG/5ML PO SOLN: 5.0000 mg | Freq: Once | ORAL | Status: DC | PRN

## 2022-03-10 MED ORDER — ONDANSETRON HCL 4 MG/2ML IJ SOLN
INTRAMUSCULAR | Status: DC | PRN
  Administered 2022-03-10: 4 mg via INTRAVENOUS

## 2022-03-10 MED ORDER — BUPIVACAINE-EPINEPHRINE (PF) 0.5% -1:200000 IJ SOLN
INTRAMUSCULAR | Status: DC | PRN
  Administered 2022-03-10: 30 mL via PERINEURAL

## 2022-03-10 MED ORDER — LIDOCAINE 2% (20 MG/ML) 5 ML SYRINGE
INTRAMUSCULAR | Status: AC
  Filled 2022-03-10: qty 5

## 2022-03-10 SURGICAL SUPPLY — 65 items
BAG COUNTER SPONGE SURGICOUNT (BAG) ×2 IMPLANT
BAG SPNG CNTER NS LX DISP (BAG) ×1
BIT DRILL 2.2 SS TIBIAL (BIT) IMPLANT
BLADE CLIPPER SURG (BLADE) IMPLANT
BNDG CMPR 9X4 STRL LF SNTH (GAUZE/BANDAGES/DRESSINGS) ×1
BNDG ELASTIC 3X5.8 VLCR STR LF (GAUZE/BANDAGES/DRESSINGS) ×2 IMPLANT
BNDG ELASTIC 4X5.8 VLCR STR LF (GAUZE/BANDAGES/DRESSINGS) ×2 IMPLANT
BNDG ESMARK 4X9 LF (GAUZE/BANDAGES/DRESSINGS) ×2 IMPLANT
BNDG GAUZE DERMACEA FLUFF 4 (GAUZE/BANDAGES/DRESSINGS) ×2 IMPLANT
BNDG GZE DERMACEA 4 6PLY (GAUZE/BANDAGES/DRESSINGS) ×1
CORD BIPOLAR FORCEPS 12FT (ELECTRODE) ×2 IMPLANT
COVER SURGICAL LIGHT HANDLE (MISCELLANEOUS) ×2 IMPLANT
CUFF TOURN SGL QUICK 18X4 (TOURNIQUET CUFF) ×2 IMPLANT
CUFF TOURN SGL QUICK 24 (TOURNIQUET CUFF)
CUFF TRNQT CYL 24X4X16.5-23 (TOURNIQUET CUFF) IMPLANT
DRAIN TLS ROUND 10FR (DRAIN) IMPLANT
DRAPE OEC MINIVIEW 54X84 (DRAPES) ×2 IMPLANT
DRAPE SURG 17X11 SM STRL (DRAPES) ×2 IMPLANT
DRSG ADAPTIC 3X8 NADH LF (GAUZE/BANDAGES/DRESSINGS) ×2 IMPLANT
ELECT REM PT RETURN 9FT ADLT (ELECTROSURGICAL)
ELECTRODE REM PT RTRN 9FT ADLT (ELECTROSURGICAL) IMPLANT
GAUZE SPONGE 4X4 12PLY STRL (GAUZE/BANDAGES/DRESSINGS) ×2 IMPLANT
GLOVE BIOGEL PI IND STRL 8.5 (GLOVE) ×2 IMPLANT
GLOVE SURG ORTHO 8.0 STRL STRW (GLOVE) ×2 IMPLANT
GOWN STRL REUS W/ TWL LRG LVL3 (GOWN DISPOSABLE) ×6 IMPLANT
GOWN STRL REUS W/ TWL XL LVL3 (GOWN DISPOSABLE) ×2 IMPLANT
GOWN STRL REUS W/TWL LRG LVL3 (GOWN DISPOSABLE) ×3
GOWN STRL REUS W/TWL XL LVL3 (GOWN DISPOSABLE) ×1
K-WIRE 1.6 (WIRE) ×1
K-WIRE FX5X1.6XNS BN SS (WIRE) ×1
KIT BASIN OR (CUSTOM PROCEDURE TRAY) ×2 IMPLANT
KIT TURNOVER KIT B (KITS) ×2 IMPLANT
KWIRE FX5X1.6XNS BN SS (WIRE) IMPLANT
MANIFOLD NEPTUNE II (INSTRUMENTS) ×2 IMPLANT
NDL HYPO 25X1 1.5 SAFETY (NEEDLE) ×2 IMPLANT
NEEDLE HYPO 25X1 1.5 SAFETY (NEEDLE) ×1 IMPLANT
NS IRRIG 1000ML POUR BTL (IV SOLUTION) ×2 IMPLANT
PACK ORTHO EXTREMITY (CUSTOM PROCEDURE TRAY) ×2 IMPLANT
PAD ARMBOARD 7.5X6 YLW CONV (MISCELLANEOUS) ×4 IMPLANT
PAD CAST 3X4 CTTN HI CHSV (CAST SUPPLIES) IMPLANT
PAD CAST 4YDX4 CTTN HI CHSV (CAST SUPPLIES) ×2 IMPLANT
PADDING CAST COTTON 3X4 STRL (CAST SUPPLIES) ×1
PADDING CAST COTTON 4X4 STRL (CAST SUPPLIES) ×1
PEG LOCKING SMOOTH 2.2X18 (Peg) IMPLANT
PEG LOCKING SMOOTH 2.2X20 (Screw) IMPLANT
PEG LOCKING SMOOTH 2.2X22 (Screw) IMPLANT
PLATE STANDARD DVR LEFT (Plate) ×1 IMPLANT
PLATE STD DVR LT 24X51 (Plate) IMPLANT
SCREW LOCK 14X2.7X 3 LD TPR (Screw) IMPLANT
SCREW LOCK 18X2.7X 3 LD TPR (Screw) IMPLANT
SCREW LOCKING 2.7X14 (Screw) ×2 IMPLANT
SCREW LOCKING 2.7X15MM (Screw) IMPLANT
SCREW LOCKING 2.7X18 (Screw) ×1 IMPLANT
SOAP 2 % CHG 4 OZ (WOUND CARE) ×2 IMPLANT
SUT PROLENE 4 0 PS 2 18 (SUTURE) IMPLANT
SUT VIC AB 2-0 FS1 27 (SUTURE) IMPLANT
SUT VIC AB 2-0 SH 27 (SUTURE) ×1
SUT VIC AB 2-0 SH 27XBRD (SUTURE) IMPLANT
SUT VICRYL 4-0 PS2 18IN ABS (SUTURE) IMPLANT
SYR CONTROL 10ML LL (SYRINGE) IMPLANT
SYSTEM CHEST DRAIN TLS 7FR (DRAIN) IMPLANT
TOWEL GREEN STERILE (TOWEL DISPOSABLE) ×2 IMPLANT
TOWEL GREEN STERILE FF (TOWEL DISPOSABLE) ×2 IMPLANT
TUBE CONNECTING 12X1/4 (SUCTIONS) ×2 IMPLANT
WATER STERILE IRR 1000ML POUR (IV SOLUTION) ×2 IMPLANT

## 2022-03-10 NOTE — Discharge Instructions (Signed)
KEEP BANDAGE CLEAN AND DRY CALL OFFICE FOR F/U APPT [phone] in 15 days KEEP HAND ELEVATED ABOVE HEART OK TO APPLY ICE TO OPERATIVE AREA CONTACT OFFICE IF ANY WORSENING PAIN OR CONCERNS.

## 2022-03-10 NOTE — Op Note (Signed)
PREOPERATIVE DIAGNOSIS: Left wrist intra-articular distal radius fracture 3 more fragments  POSTOPERATIVE DIAGNOSIS: Same  ATTENDING SURGEON: Dr. Iran Planas who scrubbed and present for the entire procedure  ASSISTANT SURGEON: None ANESTHESIA: Regional with IV sedation  OPERATIVE PROCEDURE: Open treatment of left wrist intra-articular distal radius fracture 3 more fragments Left wrist brachial radialis tendon release, tendon tenotomy Radiographs 3 views left wrist   IMPLANTS: [employer] DVR cross lock standard  EBL: Minimal  RADIOGRAPHIC INTERPRETATION: AP lateral and oblique views of the wrist do show the volar plate fixation in place with good position in all planes.  SURGICAL INDICATIONS: Patient is a right-hand-dominant female sustained a closed injury to her left wrist.  Patient was seen evaluate in the office and given the displacement and further angulation is recommended she undergo the above procedure.  The risks of surgery include but not limited to bleeding infection damage nearby nerves arteries or tendons loss of motion of the wrist and digits incomplete relief of symptoms need for further surgical invention.  Signed informed consent was obtained the day of surgery.  SURGICAL TECHNIQUE: The patient was prepped identified in the preoperative holding area marked apart a marker made the left wrist indicate correct operative site.  Patient brought back to operating placed supine on the anesthesia table where the regional anesthetic was administered.  Patient tolerated this well.  A well-padded tourniquet was then placed on the left brachium and sealed with the appropriate drape.  Left upper extremities then prepped and draped in normal sterile fashion.  A timeout was called the correct site identified procedure then began.  Attention was then turned to the left wrist.  A longitude incision made directly over the FCR sheath.  Dissection carried down through the skin and subcutaneous  tissue.  Deep dissection carried down to the floor the FCR sheath and the pronator quadratus was then carefully elevated.  Fracture site was identified.  This was an intra-articular fracture 3 more fragments.  In order to reduce the radial column brachial radialis was then carefully released and tendon tenotomy was then carried out of the brachial radialis.  The wound was then thoroughly irrigated.  Open reduction was then performed and the volar plate was then applied.  Plate was then held distally with a K wire.  Mini C arm was then used for confirmation of the alignment.  Following this the oblong screw hole was then placed proximally the plate was then adjusted.  Following this distal fixation was then carried out from the ulnar to radial direction with a combination distal locking pegs and screws.  Following this shaft fixation was completed with the fixation within the shaft nonlocking screws bicortical fixation.  The wound was then thoroughly irrigated.  After thorough wound irrigation the pronator quadratus was then closed with 2-0 Vicryl.  Subcutaneous tissues closed with Monocryl and the skin closed with 4-0 Prolene suture.  Adaptic dressing sterile compressive bandaging applied.  The patient placed in a short arm volar splint patient was then taken recovery room in good condition.  POSTOPERATIVE PLAN: Patient be discharged home.  See him back in the office in 2 weeks for wound check suture removal x-rays application of short arm cast follow-up with 4-week mark for repeat radiographs then begin a therapy regimen.  Placed the therapy order the first postoperative visit.

## 2022-03-10 NOTE — Transfer of Care (Signed)
Immediate Anesthesia Transfer of Care Note  Patient: Alicia Wilkerson  Procedure(s) Performed: OPEN REDUCTION INTERNAL FIXATION (ORIF) WRIST FRACTURE (Left: Wrist)  Patient Location: PACU  Anesthesia Type:MAC and Regional  Level of Consciousness: awake, patient cooperative, and responds to stimulation  Airway & Oxygen Therapy: Patient Spontanous Breathing and Patient connected to nasal cannula oxygen  Post-op Assessment: Report given to RN and Post -op Vital signs reviewed and stable  Post vital signs: Reviewed and stable  Last Vitals:  Vitals Value Taken Time  BP 112/64 03/10/22 1619  Temp    Pulse 69 03/10/22 1620  Resp    SpO2 95 % 03/10/22 1620  Vitals shown include unvalidated device data.  Last Pain:  Vitals:   03/10/22 1445  TempSrc:   PainSc: 0-No pain         Complications: No notable events documented.

## 2022-03-10 NOTE — Anesthesia Procedure Notes (Signed)
Anesthesia Regional Block: Supraclavicular block   Pre-Anesthetic Checklist: , timeout performed,  Correct Patient, Correct Site, Correct Laterality,  Correct Procedure, Correct Position, site marked,  Risks and benefits discussed,  Pre-op evaluation,  At surgeon's request and post-op pain management  Laterality: Left  Prep: Maximum Sterile Barrier Precautions used, chloraprep       Needles:  Injection technique: Single-shot  Needle Type: Echogenic Stimulator Needle     Needle Length: 9cm  Needle Gauge: 22     Additional Needles:   Procedures:,,,, ultrasound used (permanent image in chart),,    Narrative:  Start time: 03/10/2022 2:39 PM End time: 03/10/2022 2:42 PM Injection made incrementally with aspirations every 5 mL.  Performed by: Personally  Anesthesiologist: Brennan Bailey, MD  Additional Notes: Risks, benefits, and alternative discussed. Patient gave consent for procedure. Patient prepped and draped in sterile fashion. Sedation administered, patient remains easily responsive to voice. Relevant anatomy identified with ultrasound guidance. Local anesthetic given in 5cc increments with no signs or symptoms of intravascular injection. No pain or paraesthesias with injection. Patient monitored throughout procedure with signs of LAST or immediate complications. Tolerated well. Ultrasound image placed in chart.  Tawny Asal, MD

## 2022-03-10 NOTE — Anesthesia Postprocedure Evaluation (Signed)
Anesthesia Post Note  Patient: Alicia Wilkerson  Procedure(s) Performed: OPEN REDUCTION INTERNAL FIXATION (ORIF) WRIST FRACTURE (Left: Wrist)     Patient location during evaluation: PACU Anesthesia Type: Regional Level of consciousness: awake and alert Pain management: pain level controlled Vital Signs Assessment: post-procedure vital signs reviewed and stable Respiratory status: spontaneous breathing, nonlabored ventilation and respiratory function stable Cardiovascular status: blood pressure returned to baseline Postop Assessment: no apparent nausea or vomiting Anesthetic complications: no   No notable events documented.  Last Vitals:  Vitals:   03/10/22 1645 03/10/22 1700  BP: 138/68 130/62  Pulse: (!) 58 (!) 59  Resp: 16 14  Temp:    SpO2: 98% 95%    Last Pain:  Vitals:   03/10/22 1619  TempSrc:   PainSc: 0-No pain                 Marthenia Rolling

## 2022-03-10 NOTE — Anesthesia Preprocedure Evaluation (Addendum)
Anesthesia Evaluation  Patient identified by MRN, date of birth, ID band Patient awake    Reviewed: Allergy & Precautions, NPO status , Patient's Chart, lab work & pertinent test results  History of Anesthesia Complications (+) PONV and history of anesthetic complications  Airway Mallampati: II  TM Distance: >3 FB Neck ROM: Full    Dental no notable dental hx.    Pulmonary    Pulmonary exam normal        Cardiovascular hypertension, + CAD  Normal cardiovascular exam     Neuro/Psych   Anxiety Depression       GI/Hepatic   Endo/Other    Renal/GU      Musculoskeletal Left wrist intra-articular distal radius fracture   Abdominal   Peds  Hematology   Anesthesia Other Findings   Reproductive/Obstetrics                             Anesthesia Physical Anesthesia Plan  ASA: 2  Anesthesia Plan: MAC and Regional   Post-op Pain Management: Minimal or no pain anticipated   Induction: Intravenous  PONV Risk Score and Plan: 3 and Treatment may vary due to age or medical condition, Ondansetron, Propofol infusion and Midazolam  Airway Management Planned: Natural Airway and Simple Face Mask  Additional Equipment: None  Intra-op Plan:   Post-operative Plan:   Informed Consent: I have reviewed the patients History and Physical, chart, labs and discussed the procedure including the risks, benefits and alternatives for the proposed anesthesia with the patient or authorized representative who has indicated his/her understanding and acceptance.       Plan Discussed with: CRNA  Anesthesia Plan Comments:        Anesthesia Quick Evaluation

## 2022-03-12 ENCOUNTER — Encounter (HOSPITAL_COMMUNITY): Payer: Self-pay | Admitting: Orthopedic Surgery

## 2022-03-28 DIAGNOSIS — S62646A Nondisplaced fracture of proximal phalanx of right little finger, initial encounter for closed fracture: Secondary | ICD-10-CM | POA: Diagnosis not present

## 2022-03-28 DIAGNOSIS — S52502A Unspecified fracture of the lower end of left radius, initial encounter for closed fracture: Secondary | ICD-10-CM | POA: Diagnosis not present

## 2022-04-02 ENCOUNTER — Ambulatory Visit (INDEPENDENT_AMBULATORY_CARE_PROVIDER_SITE_OTHER): Payer: Medicare HMO

## 2022-04-02 VITALS — Ht 63.5 in | Wt 130.0 lb

## 2022-04-02 DIAGNOSIS — Z0000 Encounter for general adult medical examination without abnormal findings: Secondary | ICD-10-CM | POA: Diagnosis not present

## 2022-04-02 NOTE — Patient Instructions (Signed)
[name] , Thank you for taking time to come for your Medicare Wellness Visit. I appreciate your ongoing commitment to your health goals. Please review the following plan we discussed and let me know if I can assist you in the future.   These are the goals we discussed:  Goals      My goal for 2024 is to work on my balance, possibly start yoga and join [employer] for more physical activity.        This is a list of the screening recommended for you and due dates:  Health Maintenance  Topic Date Due   COVID-19 Vaccine (4 - 2023-24 season) 10/25/2021   DEXA scan (bone density measurement)  10/19/2028*   Medicare Annual Wellness Visit  04/03/2023   Mammogram  08/17/2023   Colon Cancer Screening  07/18/2026   DTaP/Tdap/Td vaccine (3 - Td or Tdap) 08/22/2026   Pneumonia Vaccine  Completed   Flu Shot  Completed   Hepatitis C Screening: USPSTF Recommendation to screen - Ages 18-79 yo.  Completed   Zoster (Shingles) Vaccine  Completed   HPV Vaccine  Aged Out  *Topic was postponed. The date shown is not the original due date.    Advanced directives: Yes  Conditions/risks identified: Yes  Next appointment: Follow up in one year for your annual wellness visit.   Preventive Care 70 Years and Older, Female Preventive care refers to lifestyle choices and visits with your health care provider that can promote health and wellness. What does preventive care include? A yearly physical exam. This is also called an annual well check. Dental exams once or twice a year. Routine eye exams. Ask your health care provider how often you should have your eyes checked. Personal lifestyle choices, including: Daily care of your teeth and gums. Regular physical activity. Eating a healthy diet. Avoiding tobacco and drug use. Limiting alcohol use. Practicing safe sex. Taking low-dose aspirin every day. Taking vitamin and mineral supplements as recommended by your health care provider. What  happens during an annual well check? The services and screenings done by your health care provider during your annual well check will depend on your age, overall health, lifestyle risk factors, and family history of disease. Counseling  Your health care provider may ask you questions about your: Alcohol use. Tobacco use. Drug use. Emotional well-being. Home and relationship well-being. Sexual activity. Eating habits. History of falls. Memory and ability to understand (cognition). Work and work Statistician. Reproductive health. Screening  You may have the following tests or measurements: Height, weight, and BMI. Blood pressure. Lipid and cholesterol levels. These may be checked every 5 years, or more frequently if you are over 43 years old. Skin check. Lung cancer screening. You may have this screening every year starting at age 70 if you have a 30-pack-year history of smoking and currently smoke or have quit within the past 70 years. Fecal occult blood test (FOBT) of the stool. You may have this test every year starting at age 70. Flexible sigmoidoscopy or colonoscopy. You may have a sigmoidoscopy every 5 years or a colonoscopy every 10 years starting at age 70. Hepatitis C blood test. Hepatitis B blood test. Sexually transmitted disease (STD) testing. Diabetes screening. This is done by checking your blood sugar (glucose) after you have not eaten for a while (fasting). You may have this done every 1-3 years. Bone density scan. This is done to screen for osteoporosis. You may have this done starting at age 10. Mammogram.  This may be done every 1-2 years. Talk to your health care provider about how often you should have regular mammograms. Talk with your health care provider about your test results, treatment options, and if necessary, the need for more tests. Vaccines  Your health care provider may recommend certain vaccines, such as: Influenza vaccine. This is recommended every  year. Tetanus, diphtheria, and acellular pertussis (Tdap, Td) vaccine. You may need a Td booster every 10 years. Zoster vaccine. You may need this after age 70. Pneumococcal 13-valent conjugate (PCV13) vaccine. One dose is recommended after age 70. Pneumococcal polysaccharide (PPSV23) vaccine. One dose is recommended after age 70. Talk to your health care provider about which screenings and vaccines you need and how often you need them. This information is not intended to replace advice given to you by your health care provider. Make sure you discuss any questions you have with your health care provider. Document Released: 03/09/2015 Document Revised: 10/31/2015 Document Reviewed: 12/12/2014 Elsevier Interactive Patient Education  2017 [place] Prevention in the Home Falls can cause injuries. They can happen to people of all ages. There are many things you can do to make your home safe and to help prevent falls. What can I do on the outside of my home? Regularly fix the edges of walkways and driveways and fix any cracks. Remove anything that might make you trip as you walk through a door, such as a raised step or threshold. Trim any bushes or trees on the path to your home. Use bright outdoor lighting. Clear any walking paths of anything that might make someone trip, such as rocks or tools. Regularly check to see if handrails are loose or broken. Make sure that both sides of any steps have handrails. Any raised decks and porches should have guardrails on the edges. Have any leaves, snow, or ice cleared regularly. Use sand or salt on walking paths during winter. Clean up any spills in your garage right away. This includes oil or grease spills. What can I do in the bathroom? Use night lights. Install grab bars by the toilet and in the tub and shower. Do not use towel bars as grab bars. Use non-skid mats or decals in the tub or shower. If you need to sit down in the shower, use a  plastic, non-slip stool. Keep the floor dry. Clean up any water that spills on the floor as soon as it happens. Remove soap buildup in the tub or shower regularly. Attach bath mats securely with double-sided non-slip rug tape. Do not have throw rugs and other things on the floor that can make you trip. What can I do in the bedroom? Use night lights. Make sure that you have a light by your bed that is easy to reach. Do not use any sheets or blankets that are too big for your bed. They should not hang down onto the floor. Have a firm chair that has side arms. You can use this for support while you get dressed. Do not have throw rugs and other things on the floor that can make you trip. What can I do in the kitchen? Clean up any spills right away. Avoid walking on wet floors. Keep items that you use a lot in easy-to-reach places. If you need to reach something above you, use a strong step stool that has a grab bar. Keep electrical cords out of the way. Do not use floor polish or wax that makes floors slippery. If you  must use wax, use non-skid floor wax. Do not have throw rugs and other things on the floor that can make you trip. What can I do with my stairs? Do not leave any items on the stairs. Make sure that there are handrails on both sides of the stairs and use them. Fix handrails that are broken or loose. Make sure that handrails are as long as the stairways. Check any carpeting to make sure that it is firmly attached to the stairs. Fix any carpet that is loose or worn. Avoid having throw rugs at the top or bottom of the stairs. If you do have throw rugs, attach them to the floor with carpet tape. Make sure that you have a light switch at the top of the stairs and the bottom of the stairs. If you do not have them, ask someone to add them for you. What else can I do to help prevent falls? Wear shoes that: Do not have high heels. Have rubber bottoms. Are comfortable and fit you  well. Are closed at the toe. Do not wear sandals. If you use a stepladder: Make sure that it is fully opened. Do not climb a closed stepladder. Make sure that both sides of the stepladder are locked into place. Ask someone to hold it for you, if possible. Clearly mark and make sure that you can see: Any grab bars or handrails. First and last steps. Where the edge of each step is. Use tools that help you move around (mobility aids) if they are needed. These include: Canes. Walkers. Scooters. Crutches. Turn on the lights when you go into a dark area. Replace any light bulbs as soon as they burn out. Set up your furniture so you have a clear path. Avoid moving your furniture around. If any of your floors are uneven, fix them. If there are any pets around you, be aware of where they are. Review your medicines with your doctor. Some medicines can make you feel dizzy. This can increase your chance of falling. Ask your doctor what other things that you can do to help prevent falls. This information is not intended to replace advice given to you by your health care provider. Make sure you discuss any questions you have with your health care provider. Document Released: 12/07/2008 Document Revised: 07/19/2015 Document Reviewed: 03/17/2014 Elsevier Interactive Patient Education  2017 [employer].

## 2022-04-02 NOTE — Progress Notes (Signed)
Virtual Visit via Telephone Note  I connected with  Alicia Wilkerson on 04/02/22 at  4:00 PM EST by telephone and verified that I am speaking with the correct person using two identifiers.  Location: Patient: Home Provider: [place] Persons participating in the virtual visit: [place]   I discussed the limitations, risks, security and privacy concerns of performing an evaluation and management service by telephone and the availability of in person appointments. The patient expressed understanding and agreed to proceed.  Interactive audio and video telecommunications were attempted between this nurse and patient, however failed, due to patient having technical difficulties OR patient did not have access to video capability.  We continued and completed visit with audio only.  Some vital signs may be absent or patient reported.   Sheral Flow, LPN  Subjective:   Alicia Wilkerson is a 72 y.o. female who presents for Medicare Annual (Subsequent) preventive examination.  Review of Systems     Cardiac Risk Factors include: advanced age (>5mn, >>40women);dyslipidemia;hypertension;family history of premature cardiovascular disease     Objective:    Today's Vitals   04/02/22 1602 04/02/22 1603  Weight: 130 lb (59 kg)   Height: 5' 3.5" (1.613 m)   PainSc: 0-No pain 0-No pain   Body mass index is 22.67 kg/m.     04/02/2022    4:06 PM 02/23/2022    1:42 PM 04/01/2021    1:48 PM 11/18/2019    3:02 PM 10/18/2019    6:54 AM 10/04/2019    2:24 PM 12/03/2018    6:19 AM  Advanced Directives  Does Patient Have a Medical Advance Directive? Yes Yes Yes Yes Yes Yes Yes  Type of AParamedicof AMonte[place]umaLiving will HHighland HeightsLiving will Living will;Healthcare Power of ASault Ste. MarieLiving will [place]ruesdaleLiving will [place]armstadtLiving will HCrystal CityLiving will  Does patient want to make changes to medical advance directive?   No - Patient declined      Copy of HY[place]in Chart? No - copy requested  No - copy requested No - copy requested No - copy requested  No - copy requested    Current Medications (verified) Outpatient Encounter Medications as of 04/02/2022  Medication Sig   acetaminophen (TYLENOL) 500 MG tablet Take 1,000-1,300 mg by mouth every 6 (six) hours as needed for moderate pain.   ASPIRIN 81 PO Take 81 mg by mouth daily.   clonazePAM (KLONOPIN) 0.5 MG tablet Take 1 tablet (0.5 mg total) by mouth daily as needed for anxiety.   escitalopram (LEXAPRO) 10 MG tablet TAKE ONE TABLET BY MOUTH ONE TIME DAILY   halobetasol (ULTRAVATE) 0.05 % ointment Apply 1 Application topically daily as needed (irritation).   losartan (COZAAR) 25 MG tablet Take 1 tablet (25 mg total) by mouth daily.   Omega-3 Fatty Acids (FISH OIL PO) Take 1,300 capsules by mouth daily.   oxyCODONE-acetaminophen (PERCOCET/ROXICET) 5-325 MG tablet Take 1 tablet by mouth every 4 (four) hours as needed for severe pain.   promethazine (PHENERGAN) 12.5 MG tablet Take 1 tablet (12.5 mg total) by mouth every 4 (four) hours as needed for nausea or vomiting.   rosuvastatin (CRESTOR) 20 MG tablet Take 1 tablet (20 mg total) by mouth daily.   [DISCONTINUED] halobetasol (ULTRAVATE) 0.05 % cream Apply 1 application topically daily as needed (irritation).    [DISCONTINUED] scopolamine (TRANSDERM SCOP, 1.5 MG,) 1 MG/3DAYS  Place 1 patch (1.5 mg total) onto the skin every 3 (three) days. (Patient not taking: Reported on 03/10/2022)   No facility-administered encounter medications on file as of 04/02/2022.    Allergies (verified) Penicillins, Sulfa antibiotics, Nickel, and Penicillin g   History: Past Medical History:  Diagnosis Date   Anemia    Anxiety disorder    Basal cell carcinoma (BCC) of left upper arm 12/2015   Basal cell carcinoma (BCC) of  right lower leg 04/2016   Borderline hypertension    CAD (coronary artery disease)    a. nonobst by cor CT 12/2018.   CIN II (cervical intraepithelial neoplasia II)    CIN  1   Depression    Eczema    Family history of premature CAD    History of basal cell carcinoma (BCC) excision    left upper arm 2017;   right lower leg 2018;  bilateral lower leg and hip area 2019;   inner right lower leg, outer right thigh, & upper outside left arm 09/ 2020   History of cervical dysplasia 2013   History of hereditary spherocytosis    s/p splenectomy in 1954   History of hyperparathyroidism    per pt yrs ago was put on mega dose of vit d which caused the hyperparathyroid resolved after stopping vit d   History of pelvic fracture 2013   MAI (mycobacterium avium-intracellulare) ([place])    ? possibly by CT 12/2018   Mild hyperlipidemia    Osteoporosis    PONV (postoperative nausea and vomiting)    S/P splenectomy    spherocytosis   Wears glasses    White coat syndrome without diagnosis of hypertension    Past Surgical History:  Procedure Laterality Date   ANKLE SURGERY  03/14/2015    CERVICAL CONIZATION W/BX N/A 12/13/2018   Procedure: CONIZATION CERVIX WITH BIOPSY;  Surgeon: Megan Salon, MD;  Location: [facility];  Service: Gynecology;  Laterality: N/A;   CYSTOSCOPY N/A 10/18/2019   Procedure: CYSTOSCOPY;  Surgeon: Megan Salon, MD;  Location: [facility];  Service: Gynecology;  Laterality: N/A;   IR RADIOLOGY PERIPHERAL GUIDED IV START  01/05/2019   IR US GUIDE VASC ACCESS RIGHT  01/05/2019   KNEE SURGERY Left 1991   per pt retained hardward   LEEP N/A 12/13/2018   Procedure: possible LOOP ELECTROSURGICAL EXCISION PROCEDURE (LEEP);  Surgeon: Megan Salon, MD;  Location: [facility];  Service: Gynecology;  Laterality: N/A;   ORIF ANKLE FRACTURE Left 03/14/2015   per pt retained hardware   ORIF WRIST FRACTURE Left 03/10/2022   Procedure: OPEN REDUCTION INTERNAL FIXATION (ORIF) WRIST  FRACTURE;  Surgeon: Iran Planas, MD;  Location: [facility];  Service: Orthopedics;  Laterality: Left;  regional with iv sedation   SPLENECTOMY, TOTAL  1954   due to spherocytosis   TOTAL LAPAROSCOPIC HYSTERECTOMY WITH SALPINGECTOMY N/A 10/18/2019   Procedure: TOTAL LAPAROSCOPIC HYSTERECTOMY WITH BILATERAL SALPINGECTOMY AND BILATERAL OOPHORECTOMY;  Surgeon: Megan Salon, MD;  Location: [facility];  Service: Gynecology;  Laterality: N/A;  BILATERAL SALPINGECTOMY POSS OOPHORECTOMY   TUBAL LIGATION Bilateral 1995   Family History  Problem Relation Age of Onset   Osteoporosis Mother    Colon cancer Mother        deceased   Heart failure Father    Skin cancer Father    Hypertension Father    Heart disease Father    Heart disease Sister    Crohn's disease Daughter  Breast cancer Neg Hx    Social History   Socioeconomic History   Marital status: Widowed    Spouse name: Not on file   Number of children: Not on file   Years of education: Not on file   Highest education level: Not on file  Occupational History   Not on file  Tobacco Use   Smoking status: Never   Smokeless tobacco: Never  Vaping Use   Vaping Use: Never used  Substance and Sexual Activity   Alcohol use: Not Currently   Drug use: No   Sexual activity: Not Currently    Partners: Male    Birth control/protection: Surgical, Post-menopausal    Comment: BTL & hysterectomy  Other Topics Concern   Not on file  Social History Narrative   Not on file   Social Determinants of Health   Financial Resource Strain: Low Risk  (04/02/2022)   Overall Financial Resource Strain (CARDIA)    Difficulty of Paying Living Expenses: Not hard at all  Food Insecurity: No Food Insecurity (04/02/2022)   Hunger Vital Sign    Worried About Running Out of Food in the Last Year: Never true    [place] in the Last Year: Never true  Transportation Needs: No Transportation Needs (04/02/2022)   PRAPARE - Armed forces logistics/support/administrative officer (Medical): No    Lack of Transportation (Non-Medical): No  Physical Activity: Sufficiently Active (04/02/2022)   Exercise Vital Sign    Days of Exercise per Week: 4 days    Minutes of Exercise per Session: 50 min  Stress: No Stress Concern Present (04/02/2022)   [place]    Feeling of Stress : Not at all  Social Connections: Socially Isolated (04/02/2022)   Social Connection and Isolation Panel [NHANES]    Frequency of Communication with Friends and Family: More than three times a week    Frequency of Social Gatherings with Friends and Family: Never    Attends Religious Services: Never    Marine scientist or Organizations: No    Attends Archivist Meetings: Never    Marital Status: Widowed    Tobacco Counseling Counseling given: Not Answered   Clinical Intake:  Pre-visit preparation completed: Yes  Pain : No/denies pain Pain Score: 0-No pain     BMI - recorded: 22.67 Nutritional Status: BMI of 19-24  Normal Nutritional Risks: None Diabetes: No  How often do you need to have someone help you when you read instructions, pamphlets, or other written materials from your doctor or pharmacy?: 1 - Never What is the last grade level you completed in school?: Bachelor's Degree  Diabetic? No  Interpreter Needed?: No  Information entered by :: Lisette Abu, LPN.   Activities of Daily Living    04/02/2022    4:11 PM 03/10/2022    1:21 PM  In your present state of health, do you have any difficulty performing the following activities:  Hearing? 0   Vision? 0   Difficulty concentrating or making decisions? 0   Walking or climbing stairs? 0   Dressing or bathing? 0   Doing errands, shopping? 0 0  Preparing Food and eating ? N   Using the Toilet? N   In the past six months, have you accidently leaked urine? N   Do you have problems with loss of bowel control? N    Managing your Medications? N   Managing your Finances? N  Housekeeping or managing your Housekeeping? N     Patient Care Team: Binnie Rail, MD as PCP - General (Internal Medicine) Freada Bergeron, MD as PCP - Cardiology (Cardiology) Charlton Haws, [facility] as Pharmacist (Pharmacist) Megan Salon, MD as Consulting Physician (Gynecology) Laurence Aly, OD (Optometry) Luberta Mutter, MD as Consulting Physician (Ophthalmology)  Indicate any recent Medical Services you may have received from other than [facility] providers in the past year (date may be approximate).     Assessment:   This is a routine wellness examination for Prabhnoor.  Hearing/Vision screen Hearing Screening - Comments:: Denies hearing difficulties.  Vision Screening - Comments:: Wears rx glasses - up to date with routine eye exams with Luberta Mutter, MD.   Dietary issues and exercise activities discussed: Current Exercise Habits: Home exercise routine, Type of exercise: walking;yoga;treadmill;stretching;strength training/weights;exercise ball;calisthenics, Time (Minutes): 50, Frequency (Times/Week): 4, Weekly Exercise (Minutes/Week): 200, Intensity: Moderate, Exercise limited by: None identified   Goals Addressed             This Visit's Progress    My goal for 2024 is to work on my balance, possibly start yoga and join [place] for more physical activity.        Depression Screen    04/02/2022    4:18 PM 11/19/2021    4:29 PM 11/19/2021    1:12 PM 06/18/2021    1:50 PM 05/22/2021    3:38 PM 04/01/2021    1:54 PM 11/15/2020    2:21 PM  PHQ 2/9 Scores  PHQ - 2 Score 0 0 0 0 0 0 0  PHQ- 9 Score   0        Fall Risk    04/02/2022    4:07 PM 11/19/2021    1:12 PM 11/19/2021    1:04 PM 04/01/2021    1:50 PM 11/18/2019    3:02 PM  Fall Risk   Falls in the past year? 1 0 0 0 0  Number falls in past yr: 0 0 0 0   Comment 02/22/2022      Injury with Fall? 1 0 0 0 0  Comment [facility]/fx  left wrist and pinky finger      Risk for fall due to :   No Fall Risks No Fall Risks No Fall Risks  Follow up Falls evaluation completed Falls evaluation completed Falls evaluation completed Falls evaluation completed Falls evaluation completed    [place]:  Any stairs in or around the home? Yes  If so, are there any without handrails? No  Home free of loose throw rugs in walkways, pet beds, electrical cords, etc? Yes  Adequate lighting in your home to reduce risk of falls? Yes   ASSISTIVE DEVICES UTILIZED TO PREVENT FALLS:  Life alert? No  Use of a cane, walker or w/c? No  Grab bars in the bathroom? No  Shower chair or bench in shower? No  Elevated toilet seat or a handicapped toilet? No   TIMED UP AND GO:  Was the test performed? No . Phone Visit  Cognitive Function:        04/02/2022    4:08 PM  6CIT Screen  What Year? 0 points  What month? 0 points  What time? 0 points  Count back from 20 0 points  Months in reverse 0 points  Repeat phrase 0 points  Total Score 0 points    Immunizations Immunization History  Administered Date(s) Administered  Fluad Quad(high Dose 65+) 10/20/2018, 11/19/2021   Hepatitis A, Adult 10/25/2021   Influenza Inj Mdck Quad Pf 12/18/2016   Influenza, High Dose Seasonal PF 11/11/2017   Influenza-Unspecified 11/30/2014, 11/11/2017, 11/13/2020   Moderna Covid-19 Vaccine Bivalent Booster 30yr & up 12/04/2020   Moderna SARS-COV2 Booster Vaccination 06/20/2020   Moderna Sars-Covid-2 Vaccination 03/26/2019, 04/25/2019   Pneumococcal Conjugate-13 04/26/2018   Pneumococcal Polysaccharide-23 11/04/2019   Tdap 02/25/2008, 08/21/2016   Zoster Recombinat (Shingrix) 03/18/2021, 06/27/2021    TDAP status: Up to date  Flu Vaccine status: Up to date  Pneumococcal vaccine status: Up to date  Covid-19 vaccine status: Completed vaccines  Qualifies for Shingles Vaccine? Yes   Zostavax completed Yes    Shingrix Completed?: Yes  Screening Tests Health Maintenance  Topic Date Due   COVID-19 Vaccine (4 - 2023-24 season) 10/25/2021   DEXA SCAN  10/19/2028 (Originally 01/04/2017)   Medicare Annual Wellness (AWV)  04/03/2023   MAMMOGRAM  08/17/2023   COLONOSCOPY (Pts 45-468yrInsurance coverage will need to be confirmed)  07/18/2026   DTaP/Tdap/Td (3 - Td or Tdap) 08/22/2026   Pneumonia Vaccine 6572Years old  Completed   INFLUENZA VACCINE  Completed   Hepatitis C Screening  Completed   Zoster Vaccines- Shingrix  Completed   HPV VACCINES  Aged Out    Health Maintenance  Health Maintenance Due  Topic Date Due   COVID-19 Vaccine (4 - 2023-24 season) 10/25/2021    Colorectal cancer screening: Type of screening: Colonoscopy. Completed 07/17/2021. Repeat every 5 years  Mammogram status: Completed 08/26/2021. Repeat every year  Bone Density status: Ordered 07/25/2021. Pt provided with contact info and advised to call to schedule appt.  Lung Cancer Screening: (Low Dose CT Chest recommended if Age 70-80ears, 30 pack-year currently smoking OR have quit w/in 15years.) does not qualify.   Lung Cancer Screening Referral: no  Additional Screening:  Hepatitis C Screening: does qualify; Completed 08/17/2015  Vision Screening: Recommended annual ophthalmology exams for early detection of glaucoma and other disorders of the eye. Is the patient up to date with their annual eye exam?  Yes  Who is the provider or what is the name of the office in which the patient attends annual eye exams? [facility]. If pt is not established with a provider, would they like to be referred to a provider to establish care? No .   Dental Screening: Recommended annual dental exams for proper oral hygiene  Community Resource Referral / Chronic Care Management: CRR required this visit?  No   CCM required this visit?  No      Plan:     I have personally reviewed and noted the following in the  patient's chart:   Medical and social history Use of alcohol, tobacco or illicit drugs  Current medications and supplements including opioid prescriptions. Patient is currently taking opioid prescriptions. Information provided to patient regarding non-opioid alternatives. Patient advised to discuss non-opioid treatment plan with their provider. Functional ability and status Nutritional status Physical activity Advanced directives List of other physicians Hospitalizations, surgeries, and ER visits in previous 12 months Vitals Screenings to include cognitive, depression, and falls Referrals and appointments  In addition, I have reviewed and discussed with patient certain preventive protocols, quality metrics, and best practice recommendations. A written personalized care plan for preventive services as well as general preventive health recommendations were provided to patient.     ShSheral FlowLPN   2/02/26/8784 Nurse Notes: N/A

## 2022-04-10 DIAGNOSIS — S52502A Unspecified fracture of the lower end of left radius, initial encounter for closed fracture: Secondary | ICD-10-CM | POA: Diagnosis not present

## 2022-04-10 DIAGNOSIS — S62646A Nondisplaced fracture of proximal phalanx of right little finger, initial encounter for closed fracture: Secondary | ICD-10-CM | POA: Diagnosis not present

## 2022-04-10 DIAGNOSIS — M25632 Stiffness of left wrist, not elsewhere classified: Secondary | ICD-10-CM | POA: Diagnosis not present

## 2022-04-15 ENCOUNTER — Other Ambulatory Visit: Payer: Self-pay

## 2022-04-15 MED ORDER — ROSUVASTATIN CALCIUM 20 MG PO TABS: 20.0000 mg | ORAL_TABLET | Freq: Every day | ORAL | 3 refills | Status: DC

## 2022-04-15 NOTE — Telephone Encounter (Signed)
Pt's medication was sent to pt's pharmacy as requested. Confirmation received.  °

## 2022-04-17 DIAGNOSIS — M25632 Stiffness of left wrist, not elsewhere classified: Secondary | ICD-10-CM | POA: Diagnosis not present

## 2022-04-24 DIAGNOSIS — M25632 Stiffness of left wrist, not elsewhere classified: Secondary | ICD-10-CM | POA: Diagnosis not present

## 2022-04-29 DIAGNOSIS — M25632 Stiffness of left wrist, not elsewhere classified: Secondary | ICD-10-CM | POA: Diagnosis not present

## 2022-05-05 ENCOUNTER — Other Ambulatory Visit: Payer: Self-pay | Admitting: Internal Medicine

## 2022-05-06 ENCOUNTER — Other Ambulatory Visit: Payer: Self-pay

## 2022-05-06 DIAGNOSIS — M25632 Stiffness of left wrist, not elsewhere classified: Secondary | ICD-10-CM | POA: Diagnosis not present

## 2022-05-07 NOTE — Progress Notes (Signed)
Cardiology Office Note:    Date:  05/19/2022   ID:  Alicia Wilkerson, DOB 1950-07-24, MRN [id number]  PCP:  Binnie Rail, MD   [facility] Providers Cardiologist:  Freada Bergeron, MD {  Referring MD: Binnie Rail, MD    History of Present Illness:    Alicia Wilkerson is a 72 y.o. female with a hx of anxiety, anemia, secondary hyperparathyroidism (due to excess vitamin D), osteoporosis/osteopenia, spherocytosis s/p splenectomy, mild HTN, HLD, CAD, possible MAI by CT 12/2018  who was previously followed by Dr. Meda Coffee who now presents to clinic for follow-up.  Per review of the record, TTE 10/2018 with LVEF 60-65%, no LVH, elevated LVEDP, diastolic dysfunction, normal PASP, mild TR. Coronary CTA 12/2018 with calcium score 544 (96th percentile for age/sex) with diffuse moderate to severe calcified plaque in the prox to mid LAD with possibly severe stenosis in mid LAD. FFR of LAD were prox 0.94, mid 0.92, distal 0.82, arguing against hemodynamically significant stenosis. Chest overread indicated appearance of the lungs suggested chronic indolent atypical infectious process such as myobacterium avium intracellulare (MAI) and aortic atherosclerosis. Pulmonology referral recommended but patient declined. Crestor and Aspirin were initiated.    Was last seen in clinic on 10/2021 where she was having DOE. Myoview normal with likely breast artifact but no ischemia and robust EF.   Today, the patient feels well. No chest pain, SOB, orthopnea or PND. Continues to have dyspnea with hills but this is unchanged. Blood pressure is well controlled at home in 120/70s but is always in the MD office.    Past Medical History:  Diagnosis Date   Anemia    Anxiety disorder    Basal cell carcinoma (BCC) of left upper arm 12/2015   Basal cell carcinoma (BCC) of right lower leg 04/2016   Borderline hypertension    CAD (coronary artery disease)    a. nonobst by cor CT 12/2018.   CIN II (cervical  intraepithelial neoplasia II)    CIN  1   Depression    Eczema    Family history of premature CAD    History of basal cell carcinoma (BCC) excision    left upper arm 2017;   right lower leg 2018;  bilateral lower leg and hip area 2019;   inner right lower leg, outer right thigh, & upper outside left arm 09/ 2020   History of cervical dysplasia 2013   History of hereditary spherocytosis    s/p splenectomy in 1954   History of hyperparathyroidism    per pt yrs ago was put on mega dose of vit d which caused the hyperparathyroid resolved after stopping vit d   History of pelvic fracture 2013   MAI (mycobacterium avium-intracellulare) ([place])    ? possibly by CT 12/2018   Mild hyperlipidemia    Osteoporosis    PONV (postoperative nausea and vomiting)    S/P splenectomy    spherocytosis   Wears glasses    White coat syndrome without diagnosis of hypertension     Past Surgical History:  Procedure Laterality Date   ANKLE SURGERY  03/14/2015    CERVICAL CONIZATION W/BX N/A 12/13/2018   Procedure: CONIZATION CERVIX WITH BIOPSY;  Surgeon: Megan Salon, MD;  Location: [place];  Service: Gynecology;  Laterality: N/A;   CYSTOSCOPY N/A 10/18/2019   Procedure: CYSTOSCOPY;  Surgeon: Megan Salon, MD;  Location: [facility];  Service: Gynecology;  Laterality: N/A;   IR RADIOLOGY PERIPHERAL GUIDED IV  START  01/05/2019   IR US GUIDE VASC ACCESS RIGHT  01/05/2019   KNEE SURGERY Left 1991   per pt retained hardward   LEEP N/A 12/13/2018   Procedure: possible LOOP ELECTROSURGICAL EXCISION PROCEDURE (LEEP);  Surgeon: Megan Salon, MD;  Location: [place];  Service: Gynecology;  Laterality: N/A;   ORIF ANKLE FRACTURE Left 03/14/2015   per pt retained hardware   ORIF WRIST FRACTURE Left 03/10/2022   Procedure: OPEN REDUCTION INTERNAL FIXATION (ORIF) WRIST FRACTURE;  Surgeon: Iran Planas, MD;  Location: [place];  Service: Orthopedics;  Laterality: Left;  regional with iv sedation    SPLENECTOMY, TOTAL  1954   due to spherocytosis   TOTAL LAPAROSCOPIC HYSTERECTOMY WITH SALPINGECTOMY N/A 10/18/2019   Procedure: TOTAL LAPAROSCOPIC HYSTERECTOMY WITH BILATERAL SALPINGECTOMY AND BILATERAL OOPHORECTOMY;  Surgeon: Megan Salon, MD;  Location: [place];  Service: Gynecology;  Laterality: N/A;  BILATERAL SALPINGECTOMY POSS OOPHORECTOMY   TUBAL LIGATION Bilateral 1995    Current Medications: Current Meds  Medication Sig   acetaminophen (TYLENOL) 500 MG tablet Take 1,000-1,300 mg by mouth every 6 (six) hours as needed for moderate pain.   ASPIRIN 81 PO Take 81 mg by mouth daily.   clonazePAM (KLONOPIN) 0.5 MG tablet Take 1 tablet (0.5 mg total) by mouth daily as needed for anxiety.   escitalopram (LEXAPRO) 10 MG tablet TAKE ONE TABLET BY MOUTH ONE TIME DAILY   halobetasol (ULTRAVATE) 0.05 % ointment Apply 1 Application topically daily as needed (irritation).   Omega-3 Fatty Acids (FISH OIL PO) Take 1,300 capsules by mouth daily.   oxyCODONE-acetaminophen (PERCOCET/ROXICET) 5-325 MG tablet Take 1 tablet by mouth every 4 (four) hours as needed for severe pain.   promethazine (PHENERGAN) 12.5 MG tablet Take 1 tablet (12.5 mg total) by mouth every 4 (four) hours as needed for nausea or vomiting.   rosuvastatin (CRESTOR) 20 MG tablet Take 1 tablet (20 mg total) by mouth daily.   [DISCONTINUED] losartan (COZAAR) 25 MG tablet Take 1 tablet (25 mg total) by mouth daily.     Allergies:   Penicillins, Sulfa antibiotics, Nickel, and Penicillin g   Social History   Socioeconomic History   Marital status: Widowed    Spouse name: Not on file   Number of children: Not on file   Years of education: Not on file   Highest education level: Not on file  Occupational History   Not on file  Tobacco Use   Smoking status: Never   Smokeless tobacco: Never  Vaping Use   Vaping Use: Never used  Substance and Sexual Activity   Alcohol use: Not Currently   Drug use: No    Sexual activity: Not Currently    Partners: Male    Birth control/protection: Surgical, Post-menopausal    Comment: BTL & hysterectomy  Other Topics Concern   Not on file  Social History Narrative   Not on file   Social Determinants of Health   Financial Resource Strain: Low Risk  (04/02/2022)   Overall Financial Resource Strain (CARDIA)    Difficulty of Paying Living Expenses: Not hard at all  Food Insecurity: No Food Insecurity (04/02/2022)   Hunger Vital Sign    Worried About Running Out of Food in the Last Year: Never true    [place] in the Last Year: Never true  Transportation Needs: No Transportation Needs (04/02/2022)   PRAPARE - Transportation    Lack of Transportation (Medical): No    Lack of  Transportation (Non-Medical): No  Physical Activity: Sufficiently Active (04/02/2022)   Exercise Vital Sign    Days of Exercise per Week: 4 days    Minutes of Exercise per Session: 50 min  Stress: No Stress Concern Present (04/02/2022)   [place]    Feeling of Stress : Not at all  Social Connections: Socially Isolated (04/02/2022)   Social Connection and Isolation Panel [NHANES]    Frequency of Communication with Friends and Family: More than three times a week    Frequency of Social Gatherings with Friends and Family: Never    Attends Religious Services: Never    Marine scientist or Organizations: No    Attends Archivist Meetings: Never    Marital Status: Widowed     Family History: The patient's family history includes Colon cancer in her mother; Crohn's disease in her daughter; Heart disease in her father and sister; Heart failure in her father; Hypertension in her father; Osteoporosis in her mother; Skin cancer in her father. There is no history of Breast cancer.  ROS:   Please see the history of present illness.    Review of Systems  Constitutional:  Positive for malaise/fatigue. Negative  for weight loss.  HENT:  Negative for congestion.   Eyes:  Negative for blurred vision.  Respiratory:  Positive for shortness of breath (Exertional). Negative for cough.   Cardiovascular:  Negative for chest pain, palpitations, orthopnea, claudication, leg swelling and PND.  Gastrointestinal:  Negative for heartburn and nausea.  Genitourinary:  Negative for dysuria and urgency.  Musculoskeletal:  Positive for myalgias. Negative for joint pain.  Skin:  Negative for itching and rash.  Neurological:  Negative for dizziness and headaches.  Endo/Heme/Allergies:  Does not bruise/bleed easily.  Psychiatric/Behavioral:  The patient is not nervous/anxious and does not have insomnia.    All other systems reviewed and are negative.  EKGs/Labs/Other Studies Reviewed:    The following studies were reviewed today: Myoview 10/2021   The study is normal. The study is low risk.   No ST deviation was noted.   LV perfusion is abnormal. There is no evidence of ischemia. There is no evidence of infarction. Defect 1: There is a small defect with severe reduction in uptake present in the apical to mid anteroseptal location(s) that is fixed. There is normal wall motion in the defect area. Consistent with artifact caused by breast attenuation.   Left ventricular function is normal. Nuclear stress EF: 78 %. The left ventricular ejection fraction is hyperdynamic (>65%). End diastolic cavity size is normal. End systolic cavity size is normal.   Prior study not available for comparison.   Normal stress nuclear study with fixed anteroseptal defect possibly secondary to breast attenuation.  There is no ischemia noted.  Gated ejection fraction 78% with normal wall motion.  Cardiac CTA 01/05/2019 IMPRESSION: 1. Coronary calcium score of 544. This was 18 percentile for age and sex matched control.   2. Normal coronary origin with right dominance.   3. Diffuse moderate to severe calcified plaque in the proximal to mid  LAD with possibly severe stenosis in the mid LAD. Additional analysis with CT FFR will be submitted.   FFR  1. Left Main:  No significant stenosis. 2. LAD: Proximal: 0.94, mid: 0.92, distal: 0.82. 3. LCX: No significant stenosis. 4. RCA: No significant stenosis.   IMPRESSION: 1. CT FFR analysis didn't show any significant stenosis. Aggressive medical management is recommended.  Non Cardiac Read IMPRESSION: 1. The appearance of the lungs suggests chronic indolent atypical infectious process such as mycobacterium avium intracellulare (MAI). Outpatient referral to Pulmonology for further evaluation is suggested. 2. Aortic atherosclerosis.   Echo 11/24/18  1. Left ventricular ejection fraction, by visual estimation, is 60 to 65%. The left ventricle has normal function. Normal left ventricular size. There is no left ventricular hypertrophy.   2. Elevated left ventricular end-diastolic pressure.   3. Left ventricular diastolic Doppler parameters are consistent with impaired relaxation pattern of LV diastolic filling.   4. Global right ventricle has normal systolic function.The right ventricular size is normal. No increase in right ventricular wall thickness.   5. Left atrial size was normal.   6. Right atrial size was normal.   7. The mitral valve is normal in structure. No evidence of mitral valve regurgitation. No evidence of mitral stenosis.   8. The tricuspid valve is normal in structure. Tricuspid valve regurgitation is mild.   9. The aortic valve was not well visualized Aortic valve regurgitation was not visualized by color flow Doppler. Structurally normal aortic valve, with no evidence of sclerosis or stenosis.  10. The pulmonic valve was normal in structure. Pulmonic valve regurgitation is not visualized by color flow Doppler.  11. Normal pulmonary artery systolic pressure.  12. The inferior vena cava is normal in size with greater than 50% respiratory variability, suggesting right  atrial pressure of 3 mmHg.    EKG:  No prior ECG   Recent Labs: 11/27/2021: ALT 14; TSH 1.13 03/10/2022: BUN 11; Creatinine, Ser 0.78; Hemoglobin 13.7; Platelets 428; Potassium 4.1; Sodium 137   Recent Lipid Panel    Component Value Date/Time   CHOL 139 11/27/2021 1306   CHOL 135 03/25/2019 0829   TRIG 47.0 11/27/2021 1306   HDL 64.80 11/27/2021 1306   HDL 75 03/25/2019 0829   CHOLHDL 2 11/27/2021 1306   VLDL 9.4 11/27/2021 1306   LDLCALC 65 11/27/2021 1306   LDLCALC 67 11/04/2019 0955     Risk Assessment/Calculations:           Physical Exam:    VS:  BP (!) 168/84   Pulse 64   Ht 5' 3.5" (1.613 m)   Wt 136 lb (61.7 kg)   LMP 02/24/2002   SpO2 94%   BMI 23.71 kg/m     Wt Readings from Last 3 Encounters:  05/19/22 136 lb (61.7 kg)  04/02/22 130 lb (59 kg)  03/10/22 135 lb (61.2 kg)     GEN: Well nourished, well developed in no acute distress HEENT: Normal NECK: No JVD; No carotid bruits CARDIAC: RRR, 1/6 systolic murmur, no rubs or gallops RESPIRATORY:  Clear to auscultation without rales, wheezing or rhonchi  ABDOMEN: Soft, non-tender, non-distended MUSCULOSKELETAL:  No edema; No deformity  SKIN: Warm and dry NEUROLOGIC:  Alert and oriented x 3 PSYCHIATRIC:  Normal affect   ASSESSMENT:    1. Coronary artery disease of native artery of native heart with stable angina pectoris ([place])   2. CAD in native artery   3. Hyperlipidemia, unspecified hyperlipidemia type   4. Hyperlipidemia LDL goal <70   5. Essential hypertension   6. SOB (shortness of breath)     PLAN:    In order of problems listed above:  #CAD: #DOE: Coronary CTA with moderate-to-severe calcified plaque in the prox-mid LAD with negative FFR. Ca score 544 (96%). Myoview 10/2021 with no ischemia and robust EF. Currently, doing well without anginal symptoms.  -Continue  ASA 81mg  daily -Continue crestor 20mg  daily -Continue losartan 25mg  daily  #MAI: Noted on CTA in 12/2018. Declined  Pulm referral.  #HTN: Well controlled at home and at goal <130/90. Always high in MD office -Continue losartan 25mg  daily -Patient will monitor at home and call us if above goal  #HLD: LDL well controlled at 65.  -Continue crestor 20mg  daily -Repeat lipids per PCP  Medication Adjustments/Labs and Tests Ordered: Current medicines are reviewed at length with the patient today.  Concerns regarding medicines are outlined above.   No orders of the defined types were placed in this encounter.  Meds ordered this encounter  Medications   losartan (COZAAR) 25 MG tablet    Sig: Take 1 tablet (25 mg total) by mouth daily.    Dispense:  90 tablet    Refill:  3   Patient Instructions  Medication Instructions:   Your physician recommends that you continue on your current medications as directed. Please refer to the Current Medication list given to you today.  *If you need a refill on your cardiac medications before your next appointment, please call your pharmacy*    Follow-Up: At [facility], you and your health needs are our priority.  As part of our continuing mission to provide you with exceptional heart care, we have created designated Provider Care Teams.  These Care Teams include your primary Cardiologist (physician) and Advanced Practice Providers (APPs -  Physician Assistants and Nurse Practitioners) who all work together to provide you with the care you need, when you need it.  We recommend signing up for the patient portal called "MyChart".  Sign up information is provided on this After Visit Summary.  MyChart is used to connect with patients for Virtual Visits (Telemedicine).  Patients are able to view lab/test results, encounter notes, upcoming appointments, etc.  Non-urgent messages can be sent to your provider as well.   To learn more about what you can do with MyChart, go to [email or web address].    Your next appointment:   1 year(s)  Provider:   Freada Bergeron, MD        Signed, Freada Bergeron, MD  05/19/2022 11:45 AM    [place]

## 2022-05-08 DIAGNOSIS — S52572A Other intraarticular fracture of lower end of left radius, initial encounter for closed fracture: Secondary | ICD-10-CM | POA: Diagnosis not present

## 2022-05-15 DIAGNOSIS — M25632 Stiffness of left wrist, not elsewhere classified: Secondary | ICD-10-CM | POA: Diagnosis not present

## 2022-05-19 ENCOUNTER — Ambulatory Visit: Payer: Medicare HMO | Attending: Physician Assistant | Admitting: Cardiology

## 2022-05-19 ENCOUNTER — Encounter: Payer: Self-pay | Admitting: Cardiology

## 2022-05-19 VITALS — BP 168/84 | HR 64 | Ht 63.5 in | Wt 136.0 lb

## 2022-05-19 DIAGNOSIS — R0602 Shortness of breath: Secondary | ICD-10-CM | POA: Diagnosis not present

## 2022-05-19 DIAGNOSIS — I25118 Atherosclerotic heart disease of native coronary artery with other forms of angina pectoris: Secondary | ICD-10-CM

## 2022-05-19 DIAGNOSIS — E785 Hyperlipidemia, unspecified: Secondary | ICD-10-CM

## 2022-05-19 DIAGNOSIS — I10 Essential (primary) hypertension: Secondary | ICD-10-CM | POA: Diagnosis not present

## 2022-05-19 DIAGNOSIS — I2510 Atherosclerotic heart disease of native coronary artery without angina pectoris: Secondary | ICD-10-CM | POA: Diagnosis not present

## 2022-05-19 MED ORDER — LOSARTAN POTASSIUM 25 MG PO TABS: 25.0000 mg | ORAL_TABLET | Freq: Every day | ORAL | 3 refills | Status: DC

## 2022-05-19 NOTE — Patient Instructions (Signed)
Medication Instructions:   Your physician recommends that you continue on your current medications as directed. Please refer to the Current Medication list given to you today.  *If you need a refill on your cardiac medications before your next appointment, please call your pharmacy*    Follow-Up: At [facility], you and your health needs are our priority.  As part of our continuing mission to provide you with exceptional heart care, we have created designated Provider Care Teams.  These Care Teams include your primary Cardiologist (physician) and Advanced Practice Providers (APPs -  Physician Assistants and Nurse Practitioners) who all work together to provide you with the care you need, when you need it.  We recommend signing up for the patient portal called "MyChart".  Sign up information is provided on this After Visit Summary.  MyChart is used to connect with patients for Virtual Visits (Telemedicine).  Patients are able to view lab/test results, encounter notes, upcoming appointments, etc.  Non-urgent messages can be sent to your provider as well.   To learn more about what you can do with MyChart, go to [email or web address].    Your next appointment:   1 year(s)  Provider:   Heather E Pemberton, MD      

## 2022-05-20 DIAGNOSIS — M25632 Stiffness of left wrist, not elsewhere classified: Secondary | ICD-10-CM | POA: Diagnosis not present

## 2022-05-27 DIAGNOSIS — M25632 Stiffness of left wrist, not elsewhere classified: Secondary | ICD-10-CM | POA: Diagnosis not present

## 2022-06-03 DIAGNOSIS — M25632 Stiffness of left wrist, not elsewhere classified: Secondary | ICD-10-CM | POA: Diagnosis not present

## 2022-06-06 DIAGNOSIS — S52572D Other intraarticular fracture of lower end of left radius, subsequent encounter for closed fracture with routine healing: Secondary | ICD-10-CM | POA: Diagnosis not present

## 2022-07-31 ENCOUNTER — Ambulatory Visit: Payer: Medicare HMO | Admitting: Family Medicine

## 2022-08-04 ENCOUNTER — Other Ambulatory Visit: Payer: Self-pay | Admitting: Internal Medicine

## 2022-08-06 DIAGNOSIS — H524 Presbyopia: Secondary | ICD-10-CM | POA: Diagnosis not present

## 2022-08-12 ENCOUNTER — Other Ambulatory Visit: Payer: Self-pay | Admitting: Obstetrics & Gynecology

## 2022-08-12 DIAGNOSIS — Z0000 Encounter for general adult medical examination without abnormal findings: Secondary | ICD-10-CM

## 2022-08-21 DIAGNOSIS — Z0100 Encounter for examination of eyes and vision without abnormal findings: Secondary | ICD-10-CM | POA: Diagnosis not present

## 2022-09-04 ENCOUNTER — Ambulatory Visit (INDEPENDENT_AMBULATORY_CARE_PROVIDER_SITE_OTHER): Payer: Medicare HMO | Admitting: Emergency Medicine

## 2022-09-04 ENCOUNTER — Encounter: Payer: Self-pay | Admitting: Emergency Medicine

## 2022-09-04 VITALS — BP 136/84 | HR 70 | Temp 98.3°F | Ht 63.5 in | Wt 128.5 lb

## 2022-09-04 DIAGNOSIS — B349 Viral infection, unspecified: Secondary | ICD-10-CM | POA: Insufficient documentation

## 2022-09-04 DIAGNOSIS — R21 Rash and other nonspecific skin eruption: Secondary | ICD-10-CM | POA: Diagnosis not present

## 2022-09-04 LAB — COMPREHENSIVE METABOLIC PANEL
ALT: 9 U/L (ref 0–35)
AST: 14 U/L (ref 0–37)
Albumin: 4.2 g/dL (ref 3.5–5.2)
Alkaline Phosphatase: 68 U/L (ref 39–117)
BUN: 13 mg/dL (ref 6–23)
CO2: 30 mEq/L (ref 19–32)
Calcium: 10 mg/dL (ref 8.4–10.5)
Chloride: 100 mEq/L (ref 96–112)
Creatinine, Ser: 0.76 mg/dL (ref 0.40–1.20)
GFR: 79.42 mL/min (ref >60.00)
Glucose, Bld: 114 mg/dL — ABNORMAL HIGH (ref 70–99)
Potassium: 4.4 mEq/L (ref 3.5–5.1)
Sodium: 136 mEq/L (ref 135–145)
Total Bilirubin: 1.8 mg/dL — ABNORMAL HIGH (ref 0.2–1.2)
Total Protein: 7.3 g/dL (ref 6.0–8.3)

## 2022-09-04 LAB — URINALYSIS, ROUTINE W REFLEX MICROSCOPIC
Ketones, ur: NEGATIVE
Nitrite: NEGATIVE
Specific Gravity, Urine: >=1.03 — AB (ref 1.000–1.030)
Total Protein, Urine: 30 — AB
Urine Glucose: NEGATIVE
Urobilinogen, UA: 0.2 (ref 0.0–1.0)
pH: 5.5 (ref 5.0–8.0)

## 2022-09-04 LAB — CBC WITH DIFFERENTIAL/PLATELET
Basophils Absolute: 0.1 10*3/uL (ref 0.0–0.1)
Basophils Relative: 1.1 % (ref 0.0–3.0)
Eosinophils Absolute: 0.3 10*3/uL (ref 0.0–0.7)
Eosinophils Relative: 3.8 % (ref 0.0–5.0)
HCT: 38.5 % (ref 36.0–46.0)
Hemoglobin: 13.7 g/dL (ref 12.0–15.0)
Lymphocytes Relative: 12.7 % (ref 12.0–46.0)
Lymphs Abs: 1 10*3/uL (ref 0.7–4.0)
MCHC: 35.7 g/dL (ref 30.0–36.0)
MCV: 96.9 fl (ref 78.0–100.0)
Monocytes Absolute: 1.1 10*3/uL — ABNORMAL HIGH (ref 0.1–1.0)
Monocytes Relative: 14.5 % — ABNORMAL HIGH (ref 3.0–12.0)
Neutro Abs: 5.2 10*3/uL (ref 1.4–7.7)
Neutrophils Relative %: 67.9 % (ref 43.0–77.0)
Platelets: 376 10*3/uL (ref 150.0–400.0)
RBC: 3.98 Mil/uL (ref 3.87–5.11)
RDW: 15.6 % — ABNORMAL HIGH (ref 11.5–15.5)
WBC: 7.6 10*3/uL (ref 4.0–10.5)

## 2022-09-04 NOTE — Assessment & Plan Note (Signed)
Benign rash.  Most likely viral exanthem. No signs of shingles.

## 2022-09-04 NOTE — Patient Instructions (Signed)
Viral Illness, Adult Viruses are tiny germs that can get into a person's body and cause illness. There are many different types of viruses. And they cause many types of illness. Viral illnesses can range from mild to severe. They can affect various parts of the body. Short-term conditions that are caused by a virus include colds and flu (influenza) and stomach viruses. Long-term conditions that are caused by a virus include herpes, shingles, and human immunodeficiency virus (HIV) infection. A few viruses have been linked to certain cancers. What are the causes? Many types of viruses can cause illness. Viruses get into cells in your body, multiply, and cause the infected cells to work differently or die. When these cells die, they release more of the virus. When this happens, you get symptoms of the illness and the virus spreads to other cells. If the virus takes over how the cell works, it can cause the cell to divide and grow out of control. This happens when a virus causes cancer. Different viruses get into the body in different ways. You can get a virus by: Swallowing food or water that has come in contact with the virus. Breathing in droplets that have been coughed or sneezed into the air by an infected person. Touching a surface that has the virus on it and then touching your eyes, nose, or mouth. Being bitten by an insect or animal that carries the virus. Having sexual contact with a person who is infected with the virus. Being exposed to blood or fluids that contain the virus, either through an open cut or during a transfusion. If a virus enters your body, your body's disease-fighting system (immune system) will try to fight the virus. You may be at higher risk for a viral illness if your immune system is weak. What are the signs or symptoms? Symptoms depend on the type of virus and the location of the cells that it gets into. Symptoms can include: For cold and flu  viruses: Fever. Headache. Sore throat. Muscle aches. Stuffy nose (nasal congestion). Cough. For stomach (gastrointestinal) viruses: Fever. Pain in the abdomen. Nausea or vomiting. Diarrhea. For liver viruses (hepatitis): Loss of appetite. Feeling tired. Skin or the white parts of your eyes turning yellow (jaundice). For brain and spinal cord viruses: Fever. Headache. Stiff neck. Nausea and vomiting. Confusion or being sleepy. For skin viruses: Warts. Itching. Rash. For sexually transmitted viruses: Discharge. Swelling. Redness. Rash. How is this diagnosed? This condition may be diagnosed based on one or more of these: Your symptoms and medical history. A physical exam. Tests, such as: Blood tests. Tests on a sample of mucus from your lungs (sputum sample). Tests on a poop (stool) sample. Tests on a swab of body fluids or a skin sore (lesion). How is this treated? Viruses can be hard to treat because they live within cells. Antibiotics do not treat viruses because these medicines do not get inside cells. Treatment for a viral illness may include: Resting and drinking a lot of fluids. Medicines to treat symptoms. These can include over-the-counter medicine for pain and fever, medicines for cough or congestion, and medicines for diarrhea. Antiviral medicines. These medicines are available only for certain types of viruses. Some viral illnesses can be prevented with vaccinations. A common example is the flu shot. Follow these instructions at home: Medicines Take over-the-counter and prescription medicines only as told by your health care provider. If you were prescribed an antiviral medicine, take it as told by your provider. Do not stop   taking the antiviral even if you start to feel better. Know when antibiotics are needed and when they are not needed. Antibiotics do not treat viruses. You may get an antibiotic if your provider thinks that you may have, or are at risk  for, a bacterial infection and you have a viral infection. Do not ask for an antibiotic prescription if you have been diagnosed with a viral illness. Antibiotics will not make your illness go away faster. Taking antibiotics when they are not needed can lead to antibiotic resistance. When this develops, the medicine no longer works against the bacteria that it normally fights. General instructions Drink enough fluids to keep your pee (urine) pale yellow. Rest as much as possible. Return to your normal activities as told by your provider. Ask your provider what activities are safe for you. How is this prevented? To lower your risk of getting another viral illness: Wash your hands often with soap and water for at least 20 seconds. If soap and water are not available, use hand sanitizer. Avoid touching your nose, eyes, and mouth, especially if you have not washed your hands recently. If anyone in your household has a viral infection, clean all household surfaces that may have been in contact with the virus. Use soap and hot water. You may also use a commercially prepared, bleach-containing solution. Stay away from people who are sick with symptoms of a viral infection. Do not share items such as toothbrushes and water bottles with other people. Keep your vaccinations up to date. This includes getting a yearly flu shot. Eat a healthy diet and get plenty of rest. Contact a health care provider if: You have symptoms of a viral illness that do not go away. Your symptoms come back after going away. Your symptoms get worse. Get help right away if: You have trouble breathing. You have a severe headache or a stiff neck. You have severe vomiting or pain in your abdomen. These symptoms may be an emergency. Get help right away. Call 911. Do not wait to see if the symptoms will go away. Do not drive yourself to the hospital. This information is not intended to replace advice given to you by your health  care provider. Make sure you discuss any questions you have with your health care provider. Document Revised: 02/26/2022 Document Reviewed: 12/11/2021 Elsevier Patient Education  2024 [employer].  

## 2022-09-04 NOTE — Assessment & Plan Note (Signed)
Clinically stable.  No red flag signs or symptoms. Benign examination Running its course.  Better today than several days ago Doubt COVID. Blood work done today. ED precautions given. Advised to rest and stay well-hydrated Needs to follow-up here if no better or worse during the next several day

## 2022-09-04 NOTE — Progress Notes (Signed)
Alicia Wilkerson 72 y.o.   Chief Complaint  Patient presents with   Fatigue    Patient states she had not had any energy, loss of appetite.   Rash    Patient has a rash on her chest     HISTORY OF PRESENT ILLNESS: Acute problem visit today.  Patient of Dr. Cheryll Cockayne. This is a 72 y.o. female complaining of 10-day history of multiple symptoms.  Feeling better today It all started with nausea and vomiting with coughing.  Took Phenergan with good results. Has had low energy and lack of appetite although better today Denies urinary symptoms.  No diarrhea. Developed rash to chest and extremities 2 days ago.  Nonpainful.  Nonvesicular. No other associated symptoms.  No new medications. No lifestyle changes.  Lives with her son.  Son not sick. No other complaints or medical concerns today. History of splenectomy  Rash Associated symptoms include vomiting. Pertinent negatives include no congestion, cough, diarrhea, fever, joint pain, shortness of breath or sore throat.     Prior to Admission medications   Medication Sig Start Date End Date Taking? Authorizing Provider  acetaminophen (TYLENOL) 500 MG tablet Take 1,000-1,300 mg by mouth every 6 (six) hours as needed for moderate pain.   Yes [provider]  ASPIRIN 81 PO Take 81 mg by mouth daily.   Yes [provider]  clonazePAM (KLONOPIN) 0.5 MG tablet Take 1 tablet (0.5 mg total) by mouth daily as needed for anxiety. 11/19/21  Yes Burns, Bobette Mo, MD  escitalopram (LEXAPRO) 10 MG tablet TAKE ONE TABLET BY MOUTH ONE TIME DAILY 08/04/22  Yes Burns, Bobette Mo, MD  halobetasol (ULTRAVATE) 0.05 % ointment Apply 1 Application topically daily as needed (irritation). 11/21/21  Yes [provider]  losartan (COZAAR) 25 MG tablet Take 1 tablet (25 mg total) by mouth daily. 05/19/22  Yes Meriam Sprague, MD  Omega-3 Fatty Acids (FISH OIL PO) Take 1,300 capsules by mouth daily.   Yes [provider]   oxyCODONE-acetaminophen (PERCOCET/ROXICET) 5-325 MG tablet Take 1 tablet by mouth every 4 (four) hours as needed for severe pain.   Yes [provider]  promethazine (PHENERGAN) 12.5 MG tablet Take 1 tablet (12.5 mg total) by mouth every 4 (four) hours as needed for nausea or vomiting. 06/18/21  Yes Jerene Bears, MD  rosuvastatin (CRESTOR) 20 MG tablet Take 1 tablet (20 mg total) by mouth daily. 04/15/22  Yes Meriam Sprague, MD    Allergies  Allergen Reactions   Penicillins Rash    Did it involve swelling of the face/tongue/throat, SOB, or low BP? No Did it involve sudden or severe rash/hives, skin peeling, or any reaction on the inside of your mouth or nose?  #  #  #  YES  #  #  #  Did you need to seek medical attention at a hospital or doctor's office? #  #  #  YES  #  #  #  When did it last happen?   years ago    If all above answers are "NO", may proceed with cephalosporin use.    Sulfa Antibiotics Hives and Other (See Comments)   Nickel Hives   Penicillin G Other (See Comments)    Patient Active Problem List   Diagnosis Date Noted   Hypertension 11/18/2021   Family history of malignant neoplasm of gastrointestinal tract 08/14/2020   Acute hearing loss of left ear 08/14/2020   Aortic atherosclerosis (HCC) 11/04/2019  Hyperlipidemia 11/03/2019   CAD (coronary artery disease) 11/03/2019   S/P total hysterectomy and bilateral salpingo-oophorectomy, 2021 11/03/2019   Abnormal EKG 04/26/2018   Hearing loss 10/13/2017   Basal cell carcinoma (BCC) of skin of lower extremity including hip 05/19/2017   Anxiety 10/10/2016   Eczema 10/10/2016   H/O splenectomy for hemolytic spherocytosis 10/10/2016   H/O basal cell carcinoma excision 10/10/2016   History of cervical dysplasia 08/21/2016    Past Medical History:  Diagnosis Date   Anemia    Anxiety disorder    Basal cell carcinoma (BCC) of left upper arm 12/2015   Basal cell carcinoma (BCC) of right lower leg  04/2016   Borderline hypertension    CAD (coronary artery disease)    a. nonobst by cor CT 12/2018.   CIN II (cervical intraepithelial neoplasia II)    CIN  1   Depression    Eczema    Family history of premature CAD    History of basal cell carcinoma (BCC) excision    left upper arm 2017;   right lower leg 2018;  bilateral lower leg and hip area 2019;   inner right lower leg, outer right thigh, & upper outside left arm 09/ 2020   History of cervical dysplasia 2013   History of hereditary spherocytosis    s/p splenectomy in 1954   History of hyperparathyroidism    per pt yrs ago was put on mega dose of vit d which caused the hyperparathyroid resolved after stopping vit d   History of pelvic fracture 2013   MAI (mycobacterium avium-intracellulare) (HCC)    ? possibly by CT 12/2018   Mild hyperlipidemia    Osteoporosis    PONV (postoperative nausea and vomiting)    S/P splenectomy    spherocytosis   Wears glasses    White coat syndrome without diagnosis of hypertension     Past Surgical History:  Procedure Laterality Date   ANKLE SURGERY  03/14/2015    CERVICAL CONIZATION W/BX N/A 12/13/2018   Procedure: CONIZATION CERVIX WITH BIOPSY;  Surgeon: Jerene Bears, MD;  Location: [facility] OR;  Service: Gynecology;  Laterality: N/A;   CYSTOSCOPY N/A 10/18/2019   Procedure: CYSTOSCOPY;  Surgeon: Jerene Bears, MD;  Location: [facility];  Service: Gynecology;  Laterality: N/A;   IR RADIOLOGY PERIPHERAL GUIDED IV START  01/05/2019   IR US GUIDE VASC ACCESS RIGHT  01/05/2019   KNEE SURGERY Left 1991   per pt retained hardward   LEEP N/A 12/13/2018   Procedure: possible LOOP ELECTROSURGICAL EXCISION PROCEDURE (LEEP);  Surgeon: Jerene Bears, MD;  Location: [facility] OR;  Service: Gynecology;  Laterality: N/A;   ORIF ANKLE FRACTURE Left 03/14/2015   per pt retained hardware   ORIF WRIST FRACTURE Left 03/10/2022   Procedure: OPEN REDUCTION INTERNAL FIXATION (ORIF) WRIST FRACTURE;   Surgeon: Bradly Bienenstock, MD;  Location: [facility] OR;  Service: Orthopedics;  Laterality: Left;  regional with iv sedation   SPLENECTOMY, TOTAL  1954   due to spherocytosis   TOTAL LAPAROSCOPIC HYSTERECTOMY WITH SALPINGECTOMY N/A 10/18/2019   Procedure: TOTAL LAPAROSCOPIC HYSTERECTOMY WITH BILATERAL SALPINGECTOMY AND BILATERAL OOPHORECTOMY;  Surgeon: Jerene Bears, MD;  Location: [facility];  Service: Gynecology;  Laterality: N/A;  BILATERAL SALPINGECTOMY POSS OOPHORECTOMY   TUBAL LIGATION Bilateral 1995    Social History   Socioeconomic History   Marital status: Widowed    Spouse name: Not on file   Number of children: Not on file  Years of education: Not on file   Highest education level: Not on file  Occupational History   Not on file  Tobacco Use   Smoking status: Never   Smokeless tobacco: Never  Vaping Use   Vaping status: Never Used  Substance and Sexual Activity   Alcohol use: Not Currently   Drug use: No   Sexual activity: Not Currently    Partners: Male    Birth control/protection: Surgical, Post-menopausal    Comment: BTL & hysterectomy  Other Topics Concern   Not on file  Social History Narrative   Not on file   Social Determinants of Health   Financial Resource Strain: Low Risk  (04/02/2022)   Overall Financial Resource Strain (CARDIA)    Difficulty of Paying Living Expenses: Not hard at all  Food Insecurity: No Food Insecurity (04/02/2022)   Hunger Vital Sign    Worried About Running Out of Food in the Last Year: Never true    Ran Out of Food in the Last Year: Never true  Transportation Needs: No Transportation Needs (04/02/2022)   PRAPARE - Administrator, Civil Service (Medical): No    Lack of Transportation (Non-Medical): No  Physical Activity: Sufficiently Active (04/02/2022)   Exercise Vital Sign    Days of Exercise per Week: 4 days    Minutes of Exercise per Session: 50 min  Stress: No Stress Concern Present (04/02/2022)   [employer] - Occupational Stress Questionnaire    Feeling of Stress : Not at all  Social Connections: Socially Isolated (04/02/2022)   Social Connection and Isolation Panel [NHANES]    Frequency of Communication with Friends and Family: More than three times a week    Frequency of Social Gatherings with Friends and Family: Never    Attends Religious Services: Never    Database administrator or Organizations: No    Attends Banker Meetings: Never    Marital Status: Widowed  Intimate Partner Violence: Not At Risk (04/02/2022)   Humiliation, Afraid, Rape, and Kick questionnaire    Fear of Current or Ex-Partner: No    Emotionally Abused: No    Physically Abused: No    Sexually Abused: No    Family History  Problem Relation Age of Onset   Osteoporosis Mother    Colon cancer Mother        deceased   Heart failure Father    Skin cancer Father    Hypertension Father    Heart disease Father    Heart disease Sister    Crohn's disease Daughter    Breast cancer Neg Hx      Review of Systems  Constitutional:  Positive for malaise/fatigue. Negative for fever.  HENT: Negative.  Negative for congestion and sore throat.   Respiratory: Negative.  Negative for cough and shortness of breath.   Cardiovascular: Negative.  Negative for chest pain and palpitations.  Gastrointestinal:  Positive for nausea and vomiting. Negative for abdominal pain, blood in stool, diarrhea and melena.  Genitourinary: Negative.  Negative for dysuria and hematuria.  Musculoskeletal:  Negative for joint pain and myalgias.  Skin:  Positive for rash.  Neurological: Negative.  Negative for dizziness and headaches.    Vitals:   09/04/22 0948  BP: 136/84  Pulse: 70  Temp: 98.3 F (36.8 C)  SpO2: 98%    Physical Exam Vitals reviewed.  Constitutional:      Appearance: Normal appearance.  HENT:     Head:  Normocephalic.     Mouth/Throat:     Mouth: Mucous membranes are moist.      Pharynx: Oropharynx is clear.  Eyes:     Extraocular Movements: Extraocular movements intact.     Conjunctiva/sclera: Conjunctivae normal.     Pupils: Pupils are equal, round, and reactive to light.  Cardiovascular:     Rate and Rhythm: Normal rate and regular rhythm.     Pulses: Normal pulses.     Heart sounds: Normal heart sounds.  Pulmonary:     Effort: Pulmonary effort is normal.     Breath sounds: Normal breath sounds.  Abdominal:     Palpations: Abdomen is soft.     Tenderness: There is no abdominal tenderness.  Musculoskeletal:     Cervical back: No tenderness.  Lymphadenopathy:     Cervical: No cervical adenopathy.  Skin:    General: Skin is warm and dry.     Findings: Rash present.     Comments: Maculopapular rash to central chest both sides Minimal rash to upper portions of both legs and arms  Neurological:     General: No focal deficit present.     Mental Status: She is alert and oriented to person, place, and time.  Psychiatric:        Mood and Affect: Mood normal.        Behavior: Behavior normal.      ASSESSMENT & PLAN: A total of 32 minutes was spent with the patient and counseling/coordination of care regarding preparing for this visit, review of most recent office visit notes, review of chronic medical conditions under management, review of all medications, differential diagnosis and possibility of viral illness, and need for blood work and urinalysis, prognosis, ED precautions, documentation and need for follow-up if no better or worse during the next several days.  Problem List Items Addressed This Visit       Musculoskeletal and Integument   Rash and nonspecific skin eruption    Benign rash.  Most likely viral exanthem. No signs of shingles.      Relevant Orders   Urinalysis   CBC with Differential/Platelet   Comprehensive metabolic panel     Other   Viral illness - Primary    Clinically stable.  No red flag signs or symptoms. Benign  examination Running its course.  Better today than several days ago Doubt COVID. Blood work done today. ED precautions given. Advised to rest and stay well-hydrated Needs to follow-up here if no better or worse during the next several day      Relevant Orders   Urinalysis   CBC with Differential/Platelet   Comprehensive metabolic panel   Patient Instructions  Viral Illness, Adult Viruses are tiny germs that can get into a person's body and cause illness. There are many different types of viruses. And they cause many types of illness. Viral illnesses can range from mild to severe. They can affect various parts of the body. Short-term conditions that are caused by a virus include colds and flu (influenza) and stomach viruses. Long-term conditions that are caused by a virus include herpes, shingles, and human immunodeficiency virus (HIV) infection. A few viruses have been linked to certain cancers. What are the causes? Many types of viruses can cause illness. Viruses get into cells in your body, multiply, and cause the infected cells to work differently or die. When these cells die, they release more of the virus. When this happens, you get symptoms of the illness and the virus  spreads to other cells. If the virus takes over how the cell works, it can cause the cell to divide and grow out of control. This happens when a virus causes cancer. Different viruses get into the body in different ways. You can get a virus by: Swallowing food or water that has come in contact with the virus. Breathing in droplets that have been coughed or sneezed into the air by an infected person. Touching a surface that has the virus on it and then touching your eyes, nose, or mouth. Being bitten by an insect or animal that carries the virus. Having sexual contact with a person who is infected with the virus. Being exposed to blood or fluids that contain the virus, either through an open cut or during a  transfusion. If a virus enters your body, your body's disease-fighting system (immune system) will try to fight the virus. You may be at higher risk for a viral illness if your immune system is weak. What are the signs or symptoms? Symptoms depend on the type of virus and the location of the cells that it gets into. Symptoms can include: For cold and flu viruses: Fever. Headache. Sore throat. Muscle aches. Stuffy nose (nasal congestion). Cough. For stomach (gastrointestinal) viruses: Fever. Pain in the abdomen. Nausea or vomiting. Diarrhea. For liver viruses (hepatitis): Loss of appetite. Feeling tired. Skin or the white parts of your eyes turning yellow (jaundice). For brain and spinal cord viruses: Fever. Headache. Stiff neck. Nausea and vomiting. Confusion or being sleepy. For skin viruses: Warts. Itching. Rash. For sexually transmitted viruses: Discharge. Swelling. Redness. Rash. How is this diagnosed? This condition may be diagnosed based on one or more of these: Your symptoms and medical history. A physical exam. Tests, such as: Blood tests. Tests on a sample of mucus from your lungs (sputum sample). Tests on a poop (stool) sample. Tests on a swab of body fluids or a skin sore (lesion). How is this treated? Viruses can be hard to treat because they live within cells. Antibiotics do not treat viruses because these medicines do not get inside cells. Treatment for a viral illness may include: Resting and drinking a lot of fluids. Medicines to treat symptoms. These can include over-the-counter medicine for pain and fever, medicines for cough or congestion, and medicines for diarrhea. Antiviral medicines. These medicines are available only for certain types of viruses. Some viral illnesses can be prevented with vaccinations. A common example is the flu shot. Follow these instructions at home: Medicines Take over-the-counter and prescription medicines only as told  by your health care provider. If you were prescribed an antiviral medicine, take it as told by your provider. Do not stop taking the antiviral even if you start to feel better. Know when antibiotics are needed and when they are not needed. Antibiotics do not treat viruses. You may get an antibiotic if your provider thinks that you may have, or are at risk for, a bacterial infection and you have a viral infection. Do not ask for an antibiotic prescription if you have been diagnosed with a viral illness. Antibiotics will not make your illness go away faster. Taking antibiotics when they are not needed can lead to antibiotic resistance. When this develops, the medicine no longer works against the bacteria that it normally fights. General instructions Drink enough fluids to keep your pee (urine) pale yellow. Rest as much as possible. Return to your normal activities as told by your provider. Ask your provider what activities are safe  for you. How is this prevented? To lower your risk of getting another viral illness: Wash your hands often with soap and water for at least 20 seconds. If soap and water are not available, use hand sanitizer. Avoid touching your nose, eyes, and mouth, especially if you have not washed your hands recently. If anyone in your household has a viral infection, clean all household surfaces that may have been in contact with the virus. Use soap and hot water. You may also use a commercially prepared, bleach-containing solution. Stay away from people who are sick with symptoms of a viral infection. Do not share items such as toothbrushes and water bottles with other people. Keep your vaccinations up to date. This includes getting a yearly flu shot. Eat a healthy diet and get plenty of rest. Contact a health care provider if: You have symptoms of a viral illness that do not go away. Your symptoms come back after going away. Your symptoms get worse. Get help right away if: You  have trouble breathing. You have a severe headache or a stiff neck. You have severe vomiting or pain in your abdomen. These symptoms may be an emergency. Get help right away. Call 911. Do not wait to see if the symptoms will go away. Do not drive yourself to the hospital. This information is not intended to replace advice given to you by your health care provider. Make sure you discuss any questions you have with your health care provider. Document Revised: 02/26/2022 Document Reviewed: 12/11/2021 Elsevier Patient Education  2024 [employer]    Edwina Barth, MD [facility]

## 2022-09-09 ENCOUNTER — Ambulatory Visit: Payer: Medicare HMO

## 2022-10-08 ENCOUNTER — Ambulatory Visit (INDEPENDENT_AMBULATORY_CARE_PROVIDER_SITE_OTHER): Payer: Medicare HMO

## 2022-10-08 DIAGNOSIS — Z23 Encounter for immunization: Secondary | ICD-10-CM

## 2022-10-08 NOTE — Progress Notes (Signed)
After obtaining consent, and per orders of Dr. Lawerance Bach, injection of Hep A given by Ferdie Ping. Patient instructed to report any adverse reaction to me immediately.

## 2022-10-28 ENCOUNTER — Other Ambulatory Visit: Payer: Self-pay | Admitting: Internal Medicine

## 2022-10-28 ENCOUNTER — Encounter: Payer: Self-pay | Admitting: Internal Medicine

## 2022-10-28 DIAGNOSIS — F419 Anxiety disorder, unspecified: Secondary | ICD-10-CM

## 2022-11-03 ENCOUNTER — Ambulatory Visit
Admission: RE | Admit: 2022-11-03 | Discharge: 2022-11-03 | Disposition: A | Discharge status: Home / Self Care | Payer: Medicare HMO | Source: Ambulatory Visit | Attending: Obstetrics & Gynecology | Admitting: Obstetrics & Gynecology

## 2022-11-03 ENCOUNTER — Other Ambulatory Visit: Payer: Self-pay | Admitting: Internal Medicine

## 2022-11-03 DIAGNOSIS — Z0000 Encounter for general adult medical examination without abnormal findings: Secondary | ICD-10-CM

## 2022-11-03 DIAGNOSIS — Z1231 Encounter for screening mammogram for malignant neoplasm of breast: Secondary | ICD-10-CM | POA: Diagnosis not present

## 2022-11-24 ENCOUNTER — Encounter: Payer: Self-pay | Admitting: Internal Medicine

## 2022-11-24 ENCOUNTER — Ambulatory Visit (HOSPITAL_BASED_OUTPATIENT_CLINIC_OR_DEPARTMENT_OTHER): Payer: Medicare HMO | Admitting: Obstetrics & Gynecology

## 2022-11-24 ENCOUNTER — Encounter (HOSPITAL_BASED_OUTPATIENT_CLINIC_OR_DEPARTMENT_OTHER): Payer: Self-pay | Admitting: Obstetrics & Gynecology

## 2022-11-24 DIAGNOSIS — E559 Vitamin D deficiency, unspecified: Secondary | ICD-10-CM | POA: Insufficient documentation

## 2022-11-24 NOTE — Patient Instructions (Addendum)
Flu immunization administered today.     Get the meningitis vaccine and covid vaccines at the pharmacy.    For your left ear wax - debrox ear wax drops or 50% warm water and 50% hydrogen peroxide   Blood work was ordered.   The lab is on the first floor.    Medications changes include :   none     Return in about 1 year (around 11/25/2023) for Physical Exam.   Health Maintenance, Female Adopting a healthy lifestyle and getting preventive care are important in promoting health and wellness. Ask your health care provider about: The right schedule for you to have regular tests and exams. Things you can do on your own to prevent diseases and keep yourself healthy. What should I know about diet, weight, and exercise? Eat a healthy diet  Eat a diet that includes plenty of vegetables, fruits, low-fat dairy products, and lean protein. Do not eat a lot of foods that are high in solid fats, added sugars, or sodium. Maintain a healthy weight Body mass index (BMI) is used to identify weight problems. It estimates body fat based on height and weight. Your health care provider can help determine your BMI and help you achieve or maintain a healthy weight. Get regular exercise Get regular exercise. This is one of the most important things you can do for your health. Most adults should: Exercise for at least 150 minutes each week. The exercise should increase your heart rate and make you sweat (moderate-intensity exercise). Do strengthening exercises at least twice a week. This is in addition to the moderate-intensity exercise. Spend less time sitting. Even light physical activity can be beneficial. Watch cholesterol and blood lipids Have your blood tested for lipids and cholesterol at 70 years of age, then have this test every 5 years. Have your cholesterol levels checked more often if: Your lipid or cholesterol levels are high. You are older than 70 years of age. You are at high risk for  heart disease. What should I know about cancer screening? Depending on your health history and family history, you may need to have cancer screening at various ages. This may include screening for: Breast cancer. Cervical cancer. Colorectal cancer. Skin cancer. Lung cancer. What should I know about heart disease, diabetes, and high blood pressure? Blood pressure and heart disease High blood pressure causes heart disease and increases the risk of stroke. This is more likely to develop in people who have high blood pressure readings or are overweight. Have your blood pressure checked: Every 3-5 years if you are 63-11 years of age. Every year if you are 58 years old or older. Diabetes Have regular diabetes screenings. This checks your fasting blood sugar level. Have the screening done: Once every three years after age 45 if you are at a normal weight and have a low risk for diabetes. More often and at a younger age if you are overweight or have a high risk for diabetes. What should I know about preventing infection? Hepatitis B If you have a higher risk for hepatitis B, you should be screened for this virus. Talk with your health care provider to find out if you are at risk for hepatitis B infection. Hepatitis C Testing is recommended for: Everyone born from [birth year] through 1963. Anyone with known risk factors for hepatitis C. Sexually transmitted infections (STIs) Get screened for STIs, including gonorrhea and chlamydia, if: You are sexually active and are younger than 70 years of age. You  are older than 70 years of age and your health care provider tells you that you are at risk for this type of infection. Your sexual activity has changed since you were last screened, and you are at increased risk for chlamydia or gonorrhea. Ask your health care provider if you are at risk. Ask your health care provider about whether you are at high risk for HIV. Your health care provider may recommend a  prescription medicine to help prevent HIV infection. If you choose to take medicine to prevent HIV, you should first get tested for HIV. You should then be tested every 3 months for as long as you are taking the medicine. Pregnancy If you are about to stop having your period (premenopausal) and you may become pregnant, seek counseling before you get pregnant. Take 400 to 800 micrograms (mcg) of folic acid every day if you become pregnant. Ask for birth control (contraception) if you want to prevent pregnancy. Osteoporosis and menopause Osteoporosis is a disease in which the bones lose minerals and strength with aging. This can result in bone fractures. If you are 84 years old or older, or if you are at risk for osteoporosis and fractures, ask your health care provider if you should: Be screened for bone loss. Take a calcium or vitamin D supplement to lower your risk of fractures. Be given hormone replacement therapy (HRT) to treat symptoms of menopause. Follow these instructions at home: Alcohol use Do not drink alcohol if: Your health care provider tells you not to drink. You are pregnant, may be pregnant, or are planning to become pregnant. If you drink alcohol: Limit how much you have to: 0-1 drink a day. Know how much alcohol is in your drink. In the [place] one drink equals one 12 oz bottle of beer (355 mL), one 5 oz glass of wine (148 mL), or one 1 oz glass of hard liquor (44 mL). Lifestyle Do not use any products that contain nicotine or tobacco. These products include cigarettes, chewing tobacco, and vaping devices, such as e-cigarettes. If you need help quitting, ask your health care provider. Do not use street drugs. Do not share needles. Ask your health care provider for help if you need support or information about quitting drugs. General instructions Schedule regular health, dental, and eye exams. Stay current with your vaccines. Tell your health care provider if: You often  feel depressed. You have ever been abused or do not feel safe at home. Summary Adopting a healthy lifestyle and getting preventive care are important in promoting health and wellness. Follow your health care provider's instructions about healthy diet, exercising, and getting tested or screened for diseases. Follow your health care provider's instructions on monitoring your cholesterol and blood pressure. This information is not intended to replace advice given to you by your health care provider. Make sure you discuss any questions you have with your health care provider. Document Revised: 07/02/2020 Document Reviewed: 07/02/2020 Elsevier Patient Education  2024 [employer].

## 2022-11-24 NOTE — Progress Notes (Unsigned)
Subjective:    Patient ID: Alicia Wilkerson, female    DOB: 1951-02-12, 72 y.o.   MRN: [id number]      HPI Alicia Wilkerson is here for a Physical exam and her chronic medical problems.    Virus June 1st - lasted two months.  Was seen here in July.  Her nose runs every morning  - clear.  Ears pop when she blows.   Viral rash clearing but still present.   Medications and allergies reviewed with patient and updated if appropriate.  Current Outpatient Medications on File Prior to Visit  Medication Sig Dispense Refill   acetaminophen (TYLENOL) 500 MG tablet Take 1,000-1,300 mg by mouth every 6 (six) hours as needed for moderate pain.     ASPIRIN 81 PO Take 81 mg by mouth daily.     clonazePAM (KLONOPIN) 0.5 MG tablet TAKE 1 TABLET BY MOUTH EVERY DAY AS NEEDED FOR ANXIETY 30 tablet 0   escitalopram (LEXAPRO) 10 MG tablet TAKE ONE TABLET BY MOUTH ONCE A DAY 90 tablet 0   halobetasol (ULTRAVATE) 0.05 % ointment Apply 1 Application topically daily as needed (irritation).     losartan (COZAAR) 25 MG tablet Take 1 tablet (25 mg total) by mouth daily. 90 tablet 3   Omega-3 Fatty Acids (FISH OIL PO) Take 1,300 capsules by mouth daily.     oxyCODONE-acetaminophen (PERCOCET/ROXICET) 5-325 MG tablet Take 1 tablet by mouth every 4 (four) hours as needed for severe pain.     promethazine (PHENERGAN) 12.5 MG tablet Take 1 tablet (12.5 mg total) by mouth every 4 (four) hours as needed for nausea or vomiting. 20 tablet 0   rosuvastatin (CRESTOR) 20 MG tablet Take 1 tablet (20 mg total) by mouth daily. 90 tablet 3   No current facility-administered medications on file prior to visit.    Review of Systems  Constitutional:  Negative for fever.  Eyes:  Negative for visual disturbance.  Respiratory:  Negative for cough, shortness of breath and wheezing.   Cardiovascular:  Negative for chest pain, palpitations and leg swelling.  Gastrointestinal:  Negative for abdominal pain, blood in stool, constipation and  diarrhea.       No gerd  Genitourinary:  Negative for dysuria.  Musculoskeletal:  Negative for arthralgias and back pain.  Skin:  Positive for rash (resolved viral exanthem).  Neurological:  Negative for light-headedness and headaches.  Psychiatric/Behavioral:  Negative for dysphoric mood. The patient is nervous/anxious (controlled - situational).        Objective:   Vitals:   11/25/22 1323  BP: 132/70  Pulse: 76  Temp: 98 F (36.7 C)  SpO2: 96%   Filed Weights   11/25/22 1323  Weight: 128 lb (58.1 kg)   Body mass index is 22.32 kg/m.  BP Readings from Last 3 Encounters:  11/25/22 132/70  09/04/22 136/84  05/19/22 (!) 168/84    Wt Readings from Last 3 Encounters:  11/25/22 128 lb (58.1 kg)  09/04/22 128 lb 8 oz (58.3 kg)  05/19/22 136 lb (61.7 kg)       Physical Exam Constitutional: She appears well-developed and well-nourished. No distress.  HENT:  Head: Normocephalic and atraumatic.  Right Ear: External ear normal. Normal ear canal and TM Left Ear: External ear normal.  Ear canal with moderate cerumen - non-obstructive, TM partially visualized Mouth/Throat: Oropharynx is clear and moist.  Eyes: Conjunctivae normal.  Neck: Neck supple. No tracheal deviation present. No thyromegaly present.  No carotid bruit  Cardiovascular: Normal  rate, regular rhythm and normal heart sounds.   No murmur heard.  No edema. Pulmonary/Chest: Effort normal and breath sounds normal. No respiratory distress. She has no wheezes. She has no rales.  Breast: deferred   Abdominal: Soft. She exhibits no distension. There is no tenderness.  Lymphadenopathy: She has no cervical adenopathy.  Skin: Skin is warm and dry. She is not diaphoretic.  Psychiatric: She has a normal mood and affect. Her behavior is normal.     Lab Results  Component Value Date   WBC 7.6 09/04/2022   HGB 13.7 09/04/2022   HCT 38.5 09/04/2022   PLT 376.0 09/04/2022   GLUCOSE 114 (H) 09/04/2022   CHOL 139  11/27/2021   TRIG 47.0 11/27/2021   HDL 64.80 11/27/2021   LDLCALC 65 11/27/2021   ALT 9 09/04/2022   AST 14 09/04/2022   NA 136 09/04/2022   K 4.4 09/04/2022   CL 100 09/04/2022   CREATININE 0.76 09/04/2022   BUN 13 09/04/2022   CO2 30 09/04/2022   TSH 1.13 11/27/2021   HGBA1C 4.0 Repeated and verified X2. (L) 11/13/2020         Assessment & Plan:   Physical exam: Screening blood work  ordered Exercise  walking and biking Weight  normal  Substance abuse  none   Reviewed recommended immunizations.   Flu immunization administered today.     Health Maintenance  Topic Date Due   INFLUENZA VACCINE  09/25/2022   COVID-19 Vaccine (5 - 2023-24 season) 10/26/2022   DEXA SCAN  10/19/2028 (Originally 01/04/2017)   Medicare Annual Wellness (AWV)  04/03/2023   MAMMOGRAM  11/02/2024   Colonoscopy  07/18/2026   DTaP/Tdap/Td (3 - Td or Tdap) 08/22/2026   Pneumonia Vaccine 56+ Years old  Completed   Hepatitis C Screening  Completed   Zoster Vaccines- Shingrix  Completed   HPV VACCINES  Aged Out          See Problem List for Assessment and Plan of chronic medical problems.

## 2022-11-25 ENCOUNTER — Ambulatory Visit (INDEPENDENT_AMBULATORY_CARE_PROVIDER_SITE_OTHER): Payer: Medicare HMO | Admitting: Internal Medicine

## 2022-11-25 ENCOUNTER — Other Ambulatory Visit (HOSPITAL_BASED_OUTPATIENT_CLINIC_OR_DEPARTMENT_OTHER): Payer: Self-pay | Admitting: Obstetrics & Gynecology

## 2022-11-25 VITALS — BP 132/70 | HR 76 | Temp 98.0°F | Ht 63.5 in | Wt 128.0 lb

## 2022-11-25 DIAGNOSIS — I2510 Atherosclerotic heart disease of native coronary artery without angina pectoris: Secondary | ICD-10-CM

## 2022-11-25 DIAGNOSIS — F419 Anxiety disorder, unspecified: Secondary | ICD-10-CM

## 2022-11-25 DIAGNOSIS — E559 Vitamin D deficiency, unspecified: Secondary | ICD-10-CM | POA: Diagnosis not present

## 2022-11-25 DIAGNOSIS — I10 Essential (primary) hypertension: Secondary | ICD-10-CM | POA: Diagnosis not present

## 2022-11-25 DIAGNOSIS — R739 Hyperglycemia, unspecified: Secondary | ICD-10-CM

## 2022-11-25 DIAGNOSIS — Z0000 Encounter for general adult medical examination without abnormal findings: Secondary | ICD-10-CM | POA: Diagnosis not present

## 2022-11-25 DIAGNOSIS — E782 Mixed hyperlipidemia: Secondary | ICD-10-CM

## 2022-11-25 DIAGNOSIS — Z23 Encounter for immunization: Secondary | ICD-10-CM

## 2022-11-25 MED ORDER — PROMETHAZINE HCL 12.5 MG PO TABS: 12.5000 mg | ORAL_TABLET | ORAL | 0 refills | Status: DC | PRN

## 2022-11-25 MED ORDER — NONFORMULARY OR COMPOUNDED ITEM: 0 refills | Status: DC

## 2022-11-25 NOTE — Assessment & Plan Note (Signed)
Chronic Sugars have consistently been in the normal range by A1c CMP, a1c Continue regular exercise, low sugar/carbohydrate diet

## 2022-11-25 NOTE — Assessment & Plan Note (Signed)
Chronic Regular exercise and healthy diet encouraged Check lipid panel, CMP, TSH Continue Crestor 20 mg daily

## 2022-11-25 NOTE — Assessment & Plan Note (Signed)
Chronic Following with cardiology On aspirin 81 mg daily, Crestor 20 mg daily Exercising regularly

## 2022-11-25 NOTE — Assessment & Plan Note (Signed)
Chronic ?Blood pressure well controlled ?CMP, CBC ?Continue losartan 25 mg daily ?

## 2022-11-25 NOTE — Assessment & Plan Note (Signed)
Chronic Taking vitamin D daily Check vitamin D level  

## 2022-11-25 NOTE — Assessment & Plan Note (Signed)
Chronic Controlled, Stable Continue Lexapro 10 mg daily, clonazepam 0.5 mg daily as needed

## 2022-11-26 NOTE — Addendum Note (Signed)
Addended by: Karma Ganja on: 11/26/2022 08:06 AM   Modules accepted: Orders

## 2022-12-14 ENCOUNTER — Encounter: Payer: Self-pay | Admitting: Internal Medicine

## 2022-12-14 NOTE — Progress Notes (Unsigned)
    Subjective:    Patient ID: Alicia Wilkerson, female    DOB: March 23, 1950, 72 y.o.   MRN: [id number]      HPI Alicia Wilkerson is here for No chief complaint on file.   Neck stiffness, pain -      Medications and allergies reviewed with patient and updated if appropriate.  Current Outpatient Medications on File Prior to Visit  Medication Sig Dispense Refill   acetaminophen (TYLENOL) 500 MG tablet Take 1,000-1,300 mg by mouth every 6 (six) hours as needed for moderate pain.     ASPIRIN 81 PO Take 81 mg by mouth daily.     clonazePAM (KLONOPIN) 0.5 MG tablet TAKE 1 TABLET BY MOUTH EVERY DAY AS NEEDED FOR ANXIETY 30 tablet 0   escitalopram (LEXAPRO) 10 MG tablet TAKE ONE TABLET BY MOUTH ONCE A DAY 90 tablet 0   halobetasol (ULTRAVATE) 0.05 % ointment Apply 1 Application topically daily as needed (irritation).     losartan (COZAAR) 25 MG tablet Take 1 tablet (25 mg total) by mouth daily. 90 tablet 3   NONFORMULARY OR COMPOUNDED ITEM Vitamin E vaginal suppositories 200u/ml.  One pv three times weekly. 36 each 0   Omega-3 Fatty Acids (FISH OIL PO) Take 1,300 capsules by mouth daily.     oxyCODONE-acetaminophen (PERCOCET/ROXICET) 5-325 MG tablet Take 1 tablet by mouth every 4 (four) hours as needed for severe pain.     promethazine (PHENERGAN) 12.5 MG tablet Take 1 tablet (12.5 mg total) by mouth every 4 (four) hours as needed for nausea or vomiting. 20 tablet 0   rosuvastatin (CRESTOR) 20 MG tablet Take 1 tablet (20 mg total) by mouth daily. 90 tablet 3   No current facility-administered medications on file prior to visit.    Review of Systems     Objective:  There were no vitals filed for this visit. BP Readings from Last 3 Encounters:  11/25/22 132/70  09/04/22 136/84  05/19/22 (!) 168/84   Wt Readings from Last 3 Encounters:  11/25/22 128 lb (58.1 kg)  09/04/22 128 lb 8 oz (58.3 kg)  05/19/22 136 lb (61.7 kg)   There is no height or weight on file to calculate BMI.    Physical  Exam         Assessment & Plan:    See Problem List for Assessment and Plan of chronic medical problems.

## 2022-12-15 ENCOUNTER — Ambulatory Visit: Payer: Medicare HMO | Admitting: Internal Medicine

## 2022-12-15 VITALS — BP 136/72 | HR 73 | Temp 98.1°F | Ht 63.5 in | Wt 129.0 lb

## 2022-12-15 DIAGNOSIS — M542 Cervicalgia: Secondary | ICD-10-CM | POA: Insufficient documentation

## 2022-12-15 MED ORDER — METHOCARBAMOL 500 MG PO TABS: 500.0000 mg | ORAL_TABLET | Freq: Four times a day (QID) | ORAL | 0 refills | Status: DC

## 2022-12-15 NOTE — Patient Instructions (Addendum)
      Medications changes include :   Methocarbamol 500 mg 4 times a day as needed     Return if symptoms worsen or fail to improve.

## 2022-12-15 NOTE — Assessment & Plan Note (Signed)
Acute 2 weeks ago strained her neck doing likely a combination of yard work and typing for a few hours Has been applying heat, using a massager and taking Tylenol with some improvement Also stretching some Discussed treatment options-leaving to go out of the country in 2 days Start methocarbamol 500 mg 4 times daily as needed-she has taken this in the past and tolerated it well Continue heat, stretching, massage your, Tylenol as needed Can try topical muscle medications or disposable stick-on heat pads especially for traveling Call if no improvement

## 2022-12-30 ENCOUNTER — Encounter (HOSPITAL_BASED_OUTPATIENT_CLINIC_OR_DEPARTMENT_OTHER): Payer: Self-pay | Admitting: Obstetrics & Gynecology

## 2022-12-30 ENCOUNTER — Other Ambulatory Visit (HOSPITAL_BASED_OUTPATIENT_CLINIC_OR_DEPARTMENT_OTHER): Payer: Self-pay | Admitting: *Deleted

## 2022-12-30 MED ORDER — NONFORMULARY OR COMPOUNDED ITEM: 0 refills | Status: DC

## 2022-12-30 NOTE — Progress Notes (Signed)
Pt requests refill on vitamin e. Prescription re-ordered.

## 2023-01-01 ENCOUNTER — Encounter: Payer: Self-pay | Admitting: Internal Medicine

## 2023-01-01 NOTE — Progress Notes (Signed)
Subjective:    Patient ID: Alicia Wilkerson, female    DOB: [birth year], 72 y.o.   MRN: [id number]      HPI Alicia Wilkerson is here for  Chief Complaint  Patient presents with   Back Pain    PT notes of back pain while moving a heavy suitcase. Back pain still present and believes she might also have a hernia. Currently treating with 500 mg tylenol 2-3x daily and heating pad. Has increased dosage on methocarbamol     Back pain - traveled recently and was pushing up her suitcase to get onto a train and must of strained her lower back.  Initially she did not have the pain, but it developed by the next morning.  She is pain bilateral muscular region of her lower back.  Worse with movement.  She denies any radiation of the pain.  She did take methocarbamol and Tylenol which helped.  Heat has also helped.  The back pain has gotten better, but is still present   Hernia: When she was lifting her bag when she was traveling she did feel sudden bulge in her left inguinal region.  She does have history of a hernia in the past on the other side which she did have operated and is pretty confident that is what happened when she was lifting her suitcases.  She denies any pain.   Constipation -she has been experiencing constipation for the past 2 weeks-was not able to go to the bathroom.  She started taking Dulcolax and has just started to have regular bowel movements.  .      Medications and allergies reviewed with patient and updated if appropriate.  Current Outpatient Medications on File Prior to Visit  Medication Sig Dispense Refill   acetaminophen (TYLENOL) 500 MG tablet Take 1,000-1,300 mg by mouth every 6 (six) hours as needed for moderate pain.     ASPIRIN 81 PO Take 81 mg by mouth daily.     clonazePAM (KLONOPIN) 0.5 MG tablet TAKE 1 TABLET BY MOUTH EVERY DAY AS NEEDED FOR ANXIETY 30 tablet 0   escitalopram (LEXAPRO) 10 MG tablet TAKE ONE TABLET BY MOUTH ONCE A DAY 90 tablet 0   halobetasol  (ULTRAVATE) 0.05 % ointment Apply 1 Application topically daily as needed (irritation).     losartan (COZAAR) 25 MG tablet Take 1 tablet (25 mg total) by mouth daily. 90 tablet 3   methocarbamol (ROBAXIN) 500 MG tablet Take 1 tablet (500 mg total) by mouth 4 (four) times daily. 30 tablet 0   NONFORMULARY OR COMPOUNDED ITEM Vitamin E vaginal suppositories 200u/ml.  One pv three times weekly. 36 each 0   Omega-3 Fatty Acids (FISH OIL PO) Take 1,300 capsules by mouth daily.     promethazine (PHENERGAN) 12.5 MG tablet Take 1 tablet (12.5 mg total) by mouth every 4 (four) hours as needed for nausea or vomiting. 20 tablet 0   rosuvastatin (CRESTOR) 20 MG tablet Take 1 tablet (20 mg total) by mouth daily. 90 tablet 3   No current facility-administered medications on file prior to visit.    Review of Systems     Objective:   Vitals:   01/02/23 1120  BP: 126/80  Pulse: 62  Temp: 98 F (36.7 C)  SpO2: 98%   BP Readings from Last 3 Encounters:  01/02/23 126/80  12/15/22 136/72  11/25/22 132/70   Wt Readings from Last 3 Encounters:  01/02/23 131 lb 3.2 oz (59.5 kg)  12/15/22 129 lb (  58.5 kg)  11/25/22 128 lb (58.1 kg)   Body mass index is 22.88 kg/m.    Physical Exam Constitutional:      General: She is not in acute distress.    Appearance: Normal appearance. She is not ill-appearing.  HENT:     Head: Normocephalic and atraumatic.  Musculoskeletal:        General: Swelling (Medium sized hernia left inguinal region-nontender) present.     Right lower leg: No edema.     Left lower leg: No edema.     Comments: No tenderness with palpation of lumbar spine.  No tenderness with palpation of lumbar paravertebral muscles, but this is the area that she was having the discomfort  Skin:    General: Skin is warm and dry.  Neurological:     Mental Status: She is alert.            Assessment & Plan:    See Problem List for Assessment and Plan of chronic medical problems.

## 2023-01-01 NOTE — Patient Instructions (Addendum)
       Medications changes include :   flexeril 5-10 mg three times a day as needed    See surgery for the hernia.     Return if symptoms worsen or fail to improve.

## 2023-01-02 ENCOUNTER — Ambulatory Visit (INDEPENDENT_AMBULATORY_CARE_PROVIDER_SITE_OTHER): Payer: Medicare HMO | Admitting: Internal Medicine

## 2023-01-02 ENCOUNTER — Encounter: Payer: Self-pay | Admitting: Internal Medicine

## 2023-01-02 VITALS — BP 126/80 | HR 62 | Temp 98.0°F | Ht 63.5 in | Wt 131.2 lb

## 2023-01-02 DIAGNOSIS — K4090 Unilateral inguinal hernia, without obstruction or gangrene, not specified as recurrent: Secondary | ICD-10-CM

## 2023-01-02 DIAGNOSIS — M545 Low back pain, unspecified: Secondary | ICD-10-CM | POA: Diagnosis not present

## 2023-01-02 HISTORY — DX: Unilateral inguinal hernia, without obstruction or gangrene, not specified as recurrent: K40.90

## 2023-01-02 MED ORDER — CYCLOBENZAPRINE HCL 5 MG PO TABS
5.0000 mg | ORAL_TABLET | Freq: Three times a day (TID) | ORAL | 0 refills | Status: AC | PRN
Start: 2023-01-02 — End: ?

## 2023-01-02 NOTE — Assessment & Plan Note (Signed)
Acute History of right inguinal hernia status post repair approximately 3 years ago This hernia occurred while traveling and doing a lot of lifting prominent hernia on exam No tenderness Will call surgery to make an appointment

## 2023-01-02 NOTE — Assessment & Plan Note (Signed)
Acute Likely muscle strain she did well traveling and pushing up heavy suitcases Has improved with methocarbamol, Tylenol and heat Has been avoiding certain activities as well Still having symptoms and would like something stronger to help Flexeril 5-10 mg 3 times daily as needed-this typically does not make her drowsy She will call if there is no improvement

## 2023-01-15 ENCOUNTER — Ambulatory Visit (HOSPITAL_BASED_OUTPATIENT_CLINIC_OR_DEPARTMENT_OTHER): Payer: Medicare HMO | Admitting: Obstetrics & Gynecology

## 2023-01-15 DIAGNOSIS — K4090 Unilateral inguinal hernia, without obstruction or gangrene, not specified as recurrent: Secondary | ICD-10-CM | POA: Diagnosis not present

## 2023-01-27 ENCOUNTER — Ambulatory Visit (HOSPITAL_BASED_OUTPATIENT_CLINIC_OR_DEPARTMENT_OTHER): Payer: Medicare HMO | Admitting: Obstetrics & Gynecology

## 2023-01-30 ENCOUNTER — Other Ambulatory Visit: Payer: Self-pay | Admitting: Internal Medicine

## 2023-02-24 ENCOUNTER — Other Ambulatory Visit (HOSPITAL_COMMUNITY)
Admission: RE | Admit: 2023-02-24 | Discharge: 2023-02-24 | Disposition: A | Discharge status: Home / Self Care | Payer: Medicare HMO | Source: Ambulatory Visit | Attending: Obstetrics & Gynecology | Admitting: Obstetrics & Gynecology

## 2023-02-24 ENCOUNTER — Ambulatory Visit (HOSPITAL_BASED_OUTPATIENT_CLINIC_OR_DEPARTMENT_OTHER): Payer: Medicare HMO | Admitting: Obstetrics & Gynecology

## 2023-02-24 VITALS — BP 128/78 | HR 60 | Ht 62.75 in | Wt 123.2 lb

## 2023-02-24 DIAGNOSIS — Z90722 Acquired absence of ovaries, bilateral: Secondary | ICD-10-CM

## 2023-02-24 DIAGNOSIS — Z90710 Acquired absence of both cervix and uterus: Secondary | ICD-10-CM

## 2023-02-24 DIAGNOSIS — R21 Rash and other nonspecific skin eruption: Secondary | ICD-10-CM

## 2023-02-24 DIAGNOSIS — B977 Papillomavirus as the cause of diseases classified elsewhere: Secondary | ICD-10-CM

## 2023-02-24 DIAGNOSIS — Z01419 Encounter for gynecological examination (general) (routine) without abnormal findings: Secondary | ICD-10-CM

## 2023-02-24 DIAGNOSIS — M810 Age-related osteoporosis without current pathological fracture: Secondary | ICD-10-CM | POA: Diagnosis not present

## 2023-02-24 DIAGNOSIS — R87625 Unsatisfactory cytologic smear of vagina: Secondary | ICD-10-CM | POA: Diagnosis not present

## 2023-02-24 DIAGNOSIS — Z87410 Personal history of cervical dysplasia: Secondary | ICD-10-CM | POA: Diagnosis not present

## 2023-02-24 DIAGNOSIS — Z9189 Other specified personal risk factors, not elsewhere classified: Secondary | ICD-10-CM | POA: Diagnosis not present

## 2023-02-24 DIAGNOSIS — N879 Dysplasia of cervix uteri, unspecified: Secondary | ICD-10-CM

## 2023-02-24 DIAGNOSIS — Z9079 Acquired absence of other genital organ(s): Secondary | ICD-10-CM | POA: Diagnosis not present

## 2023-02-24 MED ORDER — TRIAMCINOLONE ACETONIDE 0.5 % EX OINT: 1.0000 | TOPICAL_OINTMENT | Freq: Two times a day (BID) | CUTANEOUS | 0 refills | Status: DC

## 2023-02-24 NOTE — Progress Notes (Signed)
 72 y.o. G31P2003 Widowed White or Caucasian female here for breast and pelvic exam.  I am also following her for h/o CIN.  She has hx +HR HPV.  H/o hysterectomy 10/18/2019.  Denies vaginal bleeding.    Has hernia surgery with Dr. Rubin 03/02/2023.  She did fracture her wrist this last year as well with a significant fall.  She does have osteoporosis but does not want medications.    She has a skin rash that she isn't sure what is the cause.  Has tried OTC lotion which did not help.   Patient's last menstrual period was 02/24/2002.          Sexually active: No.  H/O STD:  h/o +HR HPV  Health Maintenance: PCP:  Dr. Geofm.  Last wellness appt was 08/2022.  Did blood work at that appt:   Vaccines are up to date:  no Colonoscopy:  07/17/2021, f/u 5 year recommended MMG:  11/03/2022 Negative BMD:  01/05/2015 Osteoporosis in the spine Last pap smear:  11/19/2021 ASC-US  Positive HPV.   H/o abnormal pap smear:      reports that she has never smoked. She has never used smokeless tobacco. She reports that she does not currently use alcohol. She reports that she does not use drugs.  Past Medical History:  Diagnosis Date   Anemia    Anxiety disorder    Basal cell carcinoma (BCC) of left upper arm 12/2015   Basal cell carcinoma (BCC) of right lower leg 04/2016   Borderline hypertension    CAD (coronary artery disease)    a. nonobst by cor CT 12/2018.   CIN II (cervical intraepithelial neoplasia II)    CIN  1   Depression    Eczema    Family history of premature CAD    History of basal cell carcinoma (BCC) excision    left upper arm 2017;   right lower leg 2018;  bilateral lower leg and hip area 2019;   inner right lower leg, outer right thigh, & upper outside left arm 09/ 2020   History of cervical dysplasia 2013   History of hereditary spherocytosis    s/p splenectomy in 1954   History of hyperparathyroidism    per pt yrs ago was put on mega dose of vit d which caused the hyperparathyroid  resolved after stopping vit d   History of pelvic fracture 2013   MAI (mycobacterium avium-intracellulare) (HCC)    ? possibly by CT 12/2018   Mild hyperlipidemia    Osteoporosis    PONV (postoperative nausea and vomiting)    S/P splenectomy    spherocytosis   Wears glasses    White coat syndrome without diagnosis of hypertension     Past Surgical History:  Procedure Laterality Date   ANKLE SURGERY  03/14/2015   BREAST BIOPSY Left 08/30/2021   CYSTIC APOCRINE METAPLASIA   CERVICAL CONIZATION W/BX N/A 12/13/2018   Procedure: CONIZATION CERVIX WITH BIOPSY;  Surgeon: Cleotilde Ronal RAMAN, MD;  Location: [facility] OR;  Service: Gynecology;  Laterality: N/A;   CYSTOSCOPY N/A 10/18/2019   Procedure: CYSTOSCOPY;  Surgeon: Cleotilde Ronal RAMAN, MD;  Location: [facility];  Service: Gynecology;  Laterality: N/A;   IR RADIOLOGY PERIPHERAL GUIDED IV START  01/05/2019   IR US  GUIDE VASC ACCESS RIGHT  01/05/2019   KNEE SURGERY Left 1991   per pt retained hardward   LEEP N/A 12/13/2018   Procedure: possible LOOP ELECTROSURGICAL EXCISION PROCEDURE (LEEP);  Surgeon: Cleotilde Ronal RAMAN, MD;  Location: [facility] OR;  Service: Gynecology;  Laterality: N/A;   ORIF ANKLE FRACTURE Left 03/14/2015   per pt retained hardware   ORIF WRIST FRACTURE Left 03/10/2022   Procedure: OPEN REDUCTION INTERNAL FIXATION (ORIF) WRIST FRACTURE;  Surgeon: Shari Easter, MD;  Location[facility] OR;  Service: Orthopedics;  Laterality: Left;  regional with iv sedation   SPLENECTOMY, TOTAL  1954   due to spherocytosis   TOTAL LAPAROSCOPIC HYSTERECTOMY WITH SALPINGECTOMY N/A 10/18/2019   Procedure: TOTAL LAPAROSCOPIC HYSTERECTOMY WITH BILATERAL SALPINGECTOMY AND BILATERAL OOPHORECTOMY;  Surgeon: Cleotilde Ronal RAMAN, MD;  Location: [facility] [place];  Service: Gynecology;  Laterality: N/A;  BILATERAL SALPINGECTOMY POSS OOPHORECTOMY   TUBAL LIGATION Bilateral 1995    Current Outpatient Medications  Medication Sig Dispense Refill    acetaminophen  (TYLENOL ) 500 MG tablet Take 1,000-1,300 mg by mouth every 6 (six) hours as needed for moderate pain.     ASPIRIN  81 PO Take 81 mg by mouth daily.     clonazePAM  (KLONOPIN ) 0.5 MG tablet TAKE 1 TABLET BY MOUTH EVERY DAY AS NEEDED FOR ANXIETY 30 tablet 0   cyclobenzaprine  (FLEXERIL ) 5 MG tablet Take 1-2 tablets (5-10 mg total) by mouth 3 (three) times daily as needed for muscle spasms. 40 tablet 0   escitalopram  (LEXAPRO ) 10 MG tablet TAKE ONE TABLET BY MOUTH ONCE A DAY 90 tablet 0   halobetasol  (ULTRAVATE ) 0.05 % ointment Apply 1 Application topically daily as needed (irritation).     losartan  (COZAAR ) 25 MG tablet Take 1 tablet (25 mg total) by mouth daily. 90 tablet 3   methocarbamol  (ROBAXIN ) 500 MG tablet Take 1 tablet (500 mg total) by mouth 4 (four) times daily. 30 tablet 0   NONFORMULARY OR COMPOUNDED ITEM Vitamin E vaginal suppositories 200u/ml.  One pv three times weekly. 36 each 0   Omega-3 Fatty Acids  (FISH OIL  PO) Take 1,300 capsules by mouth daily.     promethazine  (PHENERGAN ) 12.5 MG tablet Take 1 tablet (12.5 mg total) by mouth every 4 (four) hours as needed for nausea or vomiting. 20 tablet 0   rosuvastatin  (CRESTOR ) 20 MG tablet Take 1 tablet (20 mg total) by mouth daily. 90 tablet 3   No current facility-administered medications for this visit.    Family History  Problem Relation Age of Onset   Osteoporosis Mother    Colon cancer Mother        deceased   Heart failure Father    Skin cancer Father    Hypertension Father    Heart disease Father    Heart disease Sister    Crohn's disease Daughter    Breast cancer Neg Hx     Review of Systems  Constitutional: Negative.   Genitourinary: Negative.     Exam:   BP 128/78   Pulse 60   Ht 5' 2.75 (1.594 m)   Wt 123 lb 3.2 oz (55.9 kg)   LMP 02/24/2002   BMI 22.00 kg/m   Height: 5' 2.75 (159.4 cm)  General appearance: alert, cooperative and appears stated age Breasts: normal appearance, no masses or  tenderness Abdomen: soft, non-tender; bowel sounds normal; no masses,  no organomegaly Lymph nodes: Cervical, supraclavicular, and axillary nodes normal.  No abnormal inguinal nodes palpated Neurologic: Grossly normal  Pelvic: External genitalia:  no lesions              Urethra:  normal appearing urethra with no masses, tenderness or lesions  Bartholins and Skenes: normal                 Vagina: normal appearing vagina with atrophic changes and no discharge, no lesions              Cervix: absent              Pap taken: Yes.   Bimanual Exam:  Uterus:  uterus absent              Adnexa: no mass, fullness, tenderness               Rectovaginal: Confirms               Anus:  normal sphincter tone, no lesions  Chaperone, Bascom Kotyk, CMA, was present for exam.  Assessment/Plan: 1. GYN exam for high-risk Medicare patient (Primary) - Pawp smear with HR HPV obtained  - Mammogram 11/03/2022 - Colonoscopy 06/2021 - Bone mineral density 2016 - lab work done with PCP, Dr. Geofm - vaccines reviewed/updated  2. S/P total hysterectomy and bilateral salpingo-oophorectomy  3. High risk HPV infection - Cytology - PAP( [place])  4. Cervical intraepithelial neoplasia (CIN)  5. Age-related osteoporosis without current pathological fracture - discussed BMD.  She does not want to be on any treatment for this so new BMD not ordered.  6. Skin rash - will try topical ointment - triamcinolone  ointment (KENALOG ) 0.5 %; Apply 1 Application topically 2 (two) times daily.  Dispense: 30 g; Refill: 0

## 2023-03-02 DIAGNOSIS — K4090 Unilateral inguinal hernia, without obstruction or gangrene, not specified as recurrent: Secondary | ICD-10-CM | POA: Diagnosis not present

## 2023-03-02 DIAGNOSIS — Z8719 Personal history of other diseases of the digestive system: Secondary | ICD-10-CM

## 2023-03-02 HISTORY — DX: Personal history of other diseases of the digestive system: Z87.19

## 2023-03-05 ENCOUNTER — Encounter (HOSPITAL_BASED_OUTPATIENT_CLINIC_OR_DEPARTMENT_OTHER): Payer: Self-pay | Admitting: Obstetrics & Gynecology

## 2023-03-10 LAB — CYTOLOGY - PAP: Adequacy: ABNORMAL

## 2023-03-12 ENCOUNTER — Other Ambulatory Visit (HOSPITAL_COMMUNITY)
Admission: RE | Admit: 2023-03-12 | Discharge: 2023-03-12 | Disposition: A | Discharge status: Home / Self Care | Payer: Medicare HMO | Source: Ambulatory Visit | Attending: Obstetrics & Gynecology | Admitting: Obstetrics & Gynecology

## 2023-03-12 ENCOUNTER — Ambulatory Visit (INDEPENDENT_AMBULATORY_CARE_PROVIDER_SITE_OTHER): Payer: Medicare HMO | Admitting: Obstetrics & Gynecology

## 2023-03-12 ENCOUNTER — Encounter (HOSPITAL_BASED_OUTPATIENT_CLINIC_OR_DEPARTMENT_OTHER): Payer: Self-pay | Admitting: Obstetrics & Gynecology

## 2023-03-12 DIAGNOSIS — R87615 Unsatisfactory cytologic smear of cervix: Secondary | ICD-10-CM | POA: Insufficient documentation

## 2023-03-12 DIAGNOSIS — Z9189 Other specified personal risk factors, not elsewhere classified: Secondary | ICD-10-CM | POA: Insufficient documentation

## 2023-03-12 DIAGNOSIS — Z124 Encounter for screening for malignant neoplasm of cervix: Secondary | ICD-10-CM | POA: Diagnosis not present

## 2023-03-12 DIAGNOSIS — Z1151 Encounter for screening for human papillomavirus (HPV): Secondary | ICD-10-CM | POA: Insufficient documentation

## 2023-03-12 DIAGNOSIS — Z87410 Personal history of cervical dysplasia: Secondary | ICD-10-CM

## 2023-03-12 NOTE — Progress Notes (Signed)
GYNECOLOGY  VISIT  CC:   repeat pap  HPI: 73 y.o. G35P2003 Widowed White or Caucasian female here for repeat pap due to insufficient cells.      SH:  widowed, non smoker  Review of Systems  Constitutional: Negative.     PHYSICAL EXAMINATION:    LMP 02/24/2002     General appearance: alert, cooperative and appears stated age  Pelvic: External genitalia:  no lesions              Urethra:  normal appearing urethra with no masses, tenderness or lesions              Bartholins and Skenes: normal                 Vagina: normal mucosa without prolapse or lesions              Cervix: absent              Pap obtained  Chaperone, Raechel Ache, RN, was present for exam.  Assessment/Plan: 1. History of cervical dysplasia (Primary) - Cytology - PAP( [place])  2. Encounter for repeat Pap smear due to previous insufficient cervical cells

## 2023-03-17 LAB — CYTOLOGY - PAP
Comment: NEGATIVE
Comment: NEGATIVE
Comment: NEGATIVE
Diagnosis: NEGATIVE
Diagnosis: REACTIVE
HPV 16: NEGATIVE
HPV 18 / 45: NEGATIVE
High risk HPV: POSITIVE — AB

## 2023-04-10 ENCOUNTER — Other Ambulatory Visit: Payer: Self-pay | Admitting: Internal Medicine

## 2023-04-10 ENCOUNTER — Other Ambulatory Visit: Payer: Self-pay

## 2023-04-10 DIAGNOSIS — F419 Anxiety disorder, unspecified: Secondary | ICD-10-CM

## 2023-04-10 MED ORDER — ROSUVASTATIN CALCIUM 20 MG PO TABS: 20.0000 mg | ORAL_TABLET | Freq: Every day | ORAL | 0 refills | Status: DC

## 2023-04-13 MED ORDER — CLONAZEPAM 0.5 MG PO TABS: ORAL_TABLET | ORAL | 0 refills | Status: DC

## 2023-04-15 DIAGNOSIS — L245 Irritant contact dermatitis due to other chemical products: Secondary | ICD-10-CM | POA: Diagnosis not present

## 2023-04-15 DIAGNOSIS — L821 Other seborrheic keratosis: Secondary | ICD-10-CM | POA: Diagnosis not present

## 2023-04-15 DIAGNOSIS — L570 Actinic keratosis: Secondary | ICD-10-CM | POA: Diagnosis not present

## 2023-04-15 DIAGNOSIS — D2271 Melanocytic nevi of right lower limb, including hip: Secondary | ICD-10-CM | POA: Diagnosis not present

## 2023-04-15 DIAGNOSIS — Z85828 Personal history of other malignant neoplasm of skin: Secondary | ICD-10-CM | POA: Diagnosis not present

## 2023-04-15 DIAGNOSIS — L578 Other skin changes due to chronic exposure to nonionizing radiation: Secondary | ICD-10-CM | POA: Diagnosis not present

## 2023-04-15 DIAGNOSIS — D1801 Hemangioma of skin and subcutaneous tissue: Secondary | ICD-10-CM | POA: Diagnosis not present

## 2023-04-15 DIAGNOSIS — L738 Other specified follicular disorders: Secondary | ICD-10-CM | POA: Diagnosis not present

## 2023-05-01 ENCOUNTER — Ambulatory Visit: Payer: Medicare HMO

## 2023-05-01 VITALS — Ht 62.25 in | Wt 123.0 lb

## 2023-05-01 DIAGNOSIS — Z0000 Encounter for general adult medical examination without abnormal findings: Secondary | ICD-10-CM | POA: Diagnosis not present

## 2023-05-01 NOTE — Patient Instructions (Signed)
 [name] , Thank you for taking time to come for your Medicare Wellness Visit. I appreciate your ongoing commitment to your health goals. Please review the following plan we discussed and let me know if I can assist you in the future.   Referrals/Orders/Follow-Ups/Clinician Recommendations: It was nice talking to you today.  Keep up the good work.    This is a list of the screening recommended for you and due dates:  Health Maintenance  Topic Date Due   COVID-19 Vaccine (6 - 2024-25 season) 01/28/2023   Medicare Annual Wellness Visit  04/03/2023   DEXA scan (bone density measurement)  10/19/2028*   Mammogram  11/02/2024   Colon Cancer Screening  07/18/2026   DTaP/Tdap/Td vaccine (3 - Td or Tdap) 08/22/2026   Pneumonia Vaccine  Completed   Flu Shot  Completed   Hepatitis C Screening  Completed   Zoster (Shingles) Vaccine  Completed   HPV Vaccine  Aged Out  *Topic was postponed. The date shown is not the original due date.    Advanced directives: (Copy Requested) Please bring a copy of your health care power of attorney and living will to the office to be added to your chart at your convenience. You can mail to [facility] [address] [place], [place] [address] or email to [email or web address]  Next Medicare Annual Wellness Visit scheduled for next year: Yes

## 2023-05-01 NOTE — Progress Notes (Signed)
 Subjective:   Alicia Wilkerson is a 73 y.o. who presents for a Medicare Wellness preventive visit.  Visit Complete: Virtual I connected with  Alicia Wilkerson on 05/01/23 by a video and audio enabled telemedicine application and verified that I am speaking with the correct person using two identifiers.  Patient Location: Home  Provider Location: Home Office  I discussed the limitations of evaluation and management by telemedicine. The patient expressed understanding and agreed to proceed.  Vital Signs: Because this visit was a virtual/telehealth visit, some criteria may be missing or patient reported. Any vitals not documented were not able to be obtained and vitals that have been documented are patient reported.   AWV Questionnaire: No: Patient Medicare AWV questionnaire was not completed prior to this visit.  Cardiac Risk Factors include: advanced age (>53men, >27 women);hypertension;Other (see comment), Risk factor comments: CAD, Aortic atherosclerosis     Objective:    Today's Vitals   05/01/23 1313  Weight: 123 lb (55.8 kg)  Height: 5' 2.25" (1.581 m)   Body mass index is 22.32 kg/m.     05/01/2023    1:23 PM 04/02/2022    4:06 PM 02/23/2022    1:42 PM 04/01/2021    1:48 PM 11/18/2019    3:02 PM 10/18/2019    6:54 AM 10/04/2019    2:24 PM  Advanced Directives  Does Patient Have a Medical Advance Directive? Yes Yes Yes Yes Yes Yes Yes  Type of Estate agent of [place];Living will Healthcare Power of [place];Living will Healthcare Power of [place];Living will Living will;Healthcare Power of [employer] Power of [place];Living will Healthcare Power of [place];Living will Healthcare Power of [place];Living will  Does patient want to make changes to medical advance directive?    No - Patient declined     Copy of Healthcare Power of Attorney in Chart? No - copy requested No - copy requested  No - copy requested No - copy requested No - copy  requested     Current Medications (verified) Outpatient Encounter Medications as of 05/01/2023  Medication Sig   acetaminophen (TYLENOL) 500 MG tablet Take 1,000-1,300 mg by mouth every 6 (six) hours as needed for moderate pain.   ASPIRIN 81 PO Take 81 mg by mouth daily.   clonazePAM (KLONOPIN) 0.5 MG tablet 1 tab by mouth once daily as needed   cyclobenzaprine (FLEXERIL) 5 MG tablet Take 1-2 tablets (5-10 mg total) by mouth 3 (three) times daily as needed for muscle spasms.   escitalopram (LEXAPRO) 10 MG tablet TAKE ONE TABLET BY MOUTH ONCE A DAY   halobetasol (ULTRAVATE) 0.05 % ointment Apply 1 Application topically daily as needed (irritation).   losartan (COZAAR) 25 MG tablet Take 1 tablet (25 mg total) by mouth daily.   methocarbamol (ROBAXIN) 500 MG tablet Take 1 tablet (500 mg total) by mouth 4 (four) times daily.   NONFORMULARY OR COMPOUNDED ITEM Vitamin E vaginal suppositories 200u/ml.  One pv three times weekly.   Omega-3 Fatty Acids (FISH OIL PO) Take 1,300 capsules by mouth daily.   promethazine (PHENERGAN) 12.5 MG tablet Take 1 tablet (12.5 mg total) by mouth every 4 (four) hours as needed for nausea or vomiting.   rosuvastatin (CRESTOR) 20 MG tablet Take 1 tablet (20 mg total) by mouth daily.   triamcinolone ointment (KENALOG) 0.5 % Apply 1 Application topically 2 (two) times daily.   No facility-administered encounter medications on file as of 05/01/2023.    Allergies (verified) Penicillins, Sulfa antibiotics,  Nickel, and Penicillin g   History: Past Medical History:  Diagnosis Date   Anemia    Anxiety disorder    Basal cell carcinoma (BCC) of left upper arm 12/2015   Basal cell carcinoma (BCC) of right lower leg 04/2016   Borderline hypertension    CAD (coronary artery disease)    a. nonobst by cor CT 12/2018.   CIN II (cervical intraepithelial neoplasia II)    CIN  1   Depression    Eczema    Family history of premature CAD    History of basal cell carcinoma  (BCC) excision    left upper arm 2017;   right lower leg 2018;  bilateral lower leg and hip area 2019;   inner right lower leg, outer right thigh, & upper outside left arm 09/ 2020   History of cervical dysplasia 2013   History of hereditary spherocytosis    s/p splenectomy in 1954   History of hyperparathyroidism    per pt yrs ago was put on mega dose of vit d which caused the hyperparathyroid resolved after stopping vit d   History of pelvic fracture 2013   MAI (mycobacterium avium-intracellulare) (HCC)    ? possibly by CT 12/2018   Mild hyperlipidemia    Osteoporosis    PONV (postoperative nausea and vomiting)    S/P hernia repair 03/02/2023   S/P splenectomy    spherocytosis   Wears glasses    White coat syndrome without diagnosis of hypertension    Past Surgical History:  Procedure Laterality Date   ANKLE SURGERY  03/14/2015   BREAST BIOPSY Left 08/30/2021   CYSTIC APOCRINE METAPLASIA   CERVICAL CONIZATION W/BX N/A 12/13/2018   Procedure: CONIZATION CERVIX WITH BIOPSY;  Surgeon: Jerene Bears, MD;  Location[facility] OR;  Service: Gynecology;  Laterality: N/A;   CYSTOSCOPY N/A 10/18/2019   Procedure: CYSTOSCOPY;  Surgeon: Jerene Bears, MD;  Location: [facility];  Service: Gynecology;  Laterality: N/A;   IR RADIOLOGY PERIPHERAL GUIDED IV START  01/05/2019   IR US GUIDE VASC ACCESS RIGHT  01/05/2019   KNEE SURGERY Left 1991   per pt retained hardward   LEEP N/A 12/13/2018   Procedure: possible LOOP ELECTROSURGICAL EXCISION PROCEDURE (LEEP);  Surgeon: Jerene Bears, MD;  Location: [facility] OR;  Service: Gynecology;  Laterality: N/A;   ORIF ANKLE FRACTURE Left 03/14/2015   per pt retained hardware   ORIF WRIST FRACTURE Left 03/10/2022   Procedure: OPEN REDUCTION INTERNAL FIXATION (ORIF) WRIST FRACTURE;  Surgeon: Bradly Bienenstock, MD;  Location[facility] OR;  Service: Orthopedics;  Laterality: Left;  regional with iv sedation   SPLENECTOMY, TOTAL  1954   due to spherocytosis    TOTAL LAPAROSCOPIC HYSTERECTOMY WITH SALPINGECTOMY N/A 10/18/2019   Procedure: TOTAL LAPAROSCOPIC HYSTERECTOMY WITH BILATERAL SALPINGECTOMY AND BILATERAL OOPHORECTOMY;  Surgeon: Jerene Bears, MD;  Location: [facility];  Service: Gynecology;  Laterality: N/A;  BILATERAL SALPINGECTOMY POSS OOPHORECTOMY   TUBAL LIGATION Bilateral 1995   Family History  Problem Relation Age of Onset   Osteoporosis Mother    Colon cancer Mother        deceased   Heart failure Father    Skin cancer Father    Hypertension Father    Heart disease Father    Heart disease Sister    Crohn's disease Daughter    Breast cancer Neg Hx    Social History   Socioeconomic History   Marital status: Widowed  Spouse name: Not on file   Number of children: 3   Years of education: Not on file   Highest education level: Not on file  Occupational History   Occupation: RETIRED  Tobacco Use   Smoking status: Never   Smokeless tobacco: Never  Vaping Use   Vaping status: Never Used  Substance and Sexual Activity   Alcohol use: Not Currently   Drug use: No   Sexual activity: Not Currently    Partners: Male    Birth control/protection: Surgical, Post-menopausal    Comment: BTL & hysterectomy  Other Topics Concern   Not on file  Social History Narrative   Lives alone. 2025   Social Drivers of Corporate investment banker Strain: Low Risk  (05/01/2023)   Overall Financial Resource Strain (CARDIA)    Difficulty of Paying Living Expenses: Not very hard  Food Insecurity: No Food Insecurity (05/01/2023)   Hunger Vital Sign    Worried About Running Out of Food in the Last Year: Never true    Ran Out of Food in the Last Year: Never true  Transportation Needs: No Transportation Needs (05/01/2023)   PRAPARE - Administrator, Civil Service (Medical): No    Lack of Transportation (Non-Medical): No  Physical Activity: Insufficiently Active (05/01/2023)   Exercise Vital Sign    Days of Exercise per  Week: 4 days    Minutes of Exercise per Session: 30 min  Stress: Stress Concern Present (05/01/2023)   [employer] of Occupational Health - Occupational Stress Questionnaire    Feeling of Stress : To some extent  Social Connections: Moderately Isolated (05/01/2023)   Social Connection and Isolation Panel [NHANES]    Frequency of Communication with Friends and Family: More than three times a week    Frequency of Social Gatherings with Friends and Family: More than three times a week    Attends Religious Services: Never    Database administrator or Organizations: Yes    Attends Banker Meetings: 1 to 4 times per year    Marital Status: Widowed    Tobacco Counseling Counseling given: Not Answered    Clinical Intake:  Pre-visit preparation completed: Yes  Pain : No/denies pain     BMI - recorded: 22.32 Nutritional Status: BMI of 19-24  Normal Nutritional Risks: None  How often do you need to have someone help you when you read instructions, pamphlets, or other written materials from your doctor or pharmacy?: 1 - Never  Interpreter Needed?: No  Information entered by :: Kourosh Jablonsky, RMA   Activities of Daily Living    05/01/2023    1:15 PM  In your present state of health, do you have any difficulty performing the following activities:  Hearing? 0  Vision? 0  Difficulty concentrating or making decisions? 0  Walking or climbing stairs? 0  Dressing or bathing? 0  Doing errands, shopping? 0  Preparing Food and eating ? N  Using the Toilet? N  In the past six months, have you accidently leaked urine? N  Do you have problems with loss of bowel control? N  Managing your Medications? N  Managing your Finances? N  Housekeeping or managing your Housekeeping? N    Patient Care Team: Pincus Sanes, MD as PCP - General (Internal Medicine) Meriam Sprague, MD (Inactive) as PCP - Cardiology (Cardiology) Kathyrn Sheriff, [facility] (Inactive) as Pharmacist  (Pharmacist) Jerene Bears, MD as Consulting Physician (Gynecology) Ander Purpura, OD (Optometry)  Maris Berger, MD as Consulting Physician (Ophthalmology)  Indicate any recent Medical Services you may have received from other than [facility] providers in the past year (date may be approximate).     Assessment:   This is a routine wellness examination for Alicia Wilkerson.  Hearing/Vision screen Hearing Screening - Comments:: Has some hearing loss Vision Screening - Comments:: Wears contacts    Goals Addressed             This Visit's Progress    My goal for 2024 is to work on my balance, possibly start yoga and join [employer] for more physical activity.   On track      Depression Screen     05/01/2023    1:28 PM 02/24/2023    2:35 PM 09/04/2022    9:50 AM 04/02/2022    4:18 PM 11/19/2021    4:29 PM 11/19/2021    1:12 PM 06/18/2021    1:50 PM  PHQ 2/9 Scores  PHQ - 2 Score 0 0 0 0 0 0 0  PHQ- 9 Score 0     0     Fall Risk     05/01/2023    1:24 PM 05/01/2023    1:23 PM 02/24/2023    2:36 PM 09/04/2022    9:50 AM 04/02/2022    4:07 PM  Fall Risk   Falls in the past year? 0 0 1 1 1   Number falls in past yr: 0 0 0 1 0  Comment     02/22/2022  Injury with Fall? 0 0 1 1 1   Comment   Fractured left wrist  [facility]/fx left wrist and pinky finger  Risk for fall due to : No Fall Risks No Fall Risks  History of fall(s)   Follow up Falls prevention discussed;Falls evaluation completed Falls prevention discussed;Falls evaluation completed  Falls evaluation completed Falls evaluation completed    MEDICARE RISK AT HOME:  Medicare Risk at Home If so, are there any without handrails?: Yes Home free of loose throw rugs in walkways, pet beds, electrical cords, etc?: Yes Adequate lighting in your home to reduce risk of falls?: Yes Life alert?: No Use of a cane, walker or w/c?: No Grab bars in the bathroom?: No Shower chair or bench in shower?: Yes (does not use it) Elevated toilet  seat or a handicapped toilet?: No  TIMED UP AND GO:  Was the test performed?  No  Cognitive Function: 6CIT completed        04/02/2022    4:08 PM  6CIT Screen  What Year? 0 points  What month? 0 points  What time? 0 points  Count back from 20 0 points  Months in reverse 0 points  Repeat phrase 0 points  Total Score 0 points    Immunizations Immunization History  Administered Date(s) Administered   Fluad Quad(high Dose 65+) 10/20/2018, 11/19/2021   Fluad Trivalent(High Dose 65+) 11/25/2022   Hepatitis A, Adult 10/25/2021, 10/08/2022   Influenza Inj Mdck Quad Pf 12/18/2016   Influenza, High Dose Seasonal PF 11/11/2017   Influenza-Unspecified 11/30/2014, 11/11/2017, 11/13/2020   Moderna Covid-19 Fall Seasonal Vaccine 59yrs & older 12/03/2022   Moderna Covid-19 Vaccine Bivalent Booster 94yrs & up 12/04/2020   Moderna SARS-COV2 Booster Vaccination 06/20/2020   Moderna Sars-Covid-2 Vaccination 03/26/2019, 04/25/2019   Pneumococcal Conjugate-13 04/26/2018   Pneumococcal Polysaccharide-23 11/04/2019   Tdap 02/25/2008, 08/21/2016   Unspecified SARS-COV-2 Vaccination 07/24/2022   Zoster Recombinant(Shingrix) 03/18/2021, 06/27/2021    Screening Tests Health  Maintenance  Topic Date Due   COVID-19 Vaccine (6 - 2024-25 season) 01/28/2023   Medicare Annual Wellness (AWV)  04/03/2023   DEXA SCAN  10/19/2028 (Originally 01/04/2017)   MAMMOGRAM  11/02/2024   Colonoscopy  07/18/2026   DTaP/Tdap/Td (3 - Td or Tdap) 08/22/2026   Pneumonia Vaccine 44+ Years old  Completed   INFLUENZA VACCINE  Completed   Hepatitis C Screening  Completed   Zoster Vaccines- Shingrix  Completed   HPV VACCINES  Aged Out    Health Maintenance  Health Maintenance Due  Topic Date Due   COVID-19 Vaccine (6 - 2024-25 season) 01/28/2023   Medicare Annual Wellness (AWV)  04/03/2023   Health Maintenance Items Addressed: See Nurse Notes  Additional Screening:  Vision Screening: Recommended annual  ophthalmology exams for early detection of glaucoma and other disorders of the eye.  Dental Screening: Recommended annual dental exams for proper oral hygiene  Community Resource Referral / Chronic Care Management: CRR required this visit?  No   CCM required this visit?  No     Plan:     I have personally reviewed and noted the following in the patient's chart:   Medical and social history Use of alcohol, tobacco or illicit drugs  Current medications and supplements including opioid prescriptions. Patient is not currently taking opioid prescriptions. Functional ability and status Nutritional status Physical activity Advanced directives List of other physicians Hospitalizations, surgeries, and ER visits in previous 12 months Vitals Screenings to include cognitive, depression, and falls Referrals and appointments  In addition, I have reviewed and discussed with patient certain preventive protocols, quality metrics, and best practice recommendations. A written personalized care plan for preventive services as well as general preventive health recommendations were provided to patient.     Shayma Pfefferle L Gohan Collister, CMA   05/01/2023   After Visit Summary: (MyChart) Due to this being a telephonic visit, the after visit summary with patients personalized plan was offered to patient via MyChart   Notes: Nothing significant to report at this time.

## 2023-05-04 ENCOUNTER — Other Ambulatory Visit: Payer: Self-pay | Admitting: Internal Medicine

## 2023-05-18 ENCOUNTER — Ambulatory Visit (INDEPENDENT_AMBULATORY_CARE_PROVIDER_SITE_OTHER): Admitting: Family Medicine

## 2023-05-18 ENCOUNTER — Telehealth: Payer: Self-pay | Admitting: Cardiology

## 2023-05-18 ENCOUNTER — Encounter: Payer: Self-pay | Admitting: Family Medicine

## 2023-05-18 VITALS — BP 138/76 | HR 60 | Temp 97.6°F | Ht 62.25 in | Wt 123.0 lb

## 2023-05-18 DIAGNOSIS — H6122 Impacted cerumen, left ear: Secondary | ICD-10-CM

## 2023-05-18 DIAGNOSIS — E785 Hyperlipidemia, unspecified: Secondary | ICD-10-CM

## 2023-05-18 DIAGNOSIS — I2510 Atherosclerotic heart disease of native coronary artery without angina pectoris: Secondary | ICD-10-CM

## 2023-05-18 MED ORDER — LOSARTAN POTASSIUM 25 MG PO TABS: 25.0000 mg | ORAL_TABLET | Freq: Every day | ORAL | 0 refills | Status: DC

## 2023-05-18 NOTE — Progress Notes (Signed)
   Acute Office Visit  Subjective:     Patient ID: Alicia Wilkerson, female    DOB: [birth year], 73 y.o.   MRN: [id number]  Chief Complaint  Patient presents with   Acute Visit    HPI Patient is in today for left ear fullness. Has hx cerumen impaction.   ROS Per HPI      Objective:    BP 138/76 (BP Location: Left Arm, Patient Position: Sitting)   Pulse 60   Temp 97.6 F (36.4 C) (Temporal)   Ht 5' 2.25" (1.581 m)   Wt 123 lb (55.8 kg)   LMP 02/24/2002   SpO2 96%   BMI 22.32 kg/m    Physical Exam Vitals and nursing note reviewed.  Constitutional:      General: She is not in acute distress. HENT:     Head: Normocephalic and atraumatic.     Right Ear: Tympanic membrane and ear canal normal.     Left Ear: There is impacted cerumen.     Nose: No congestion.     Mouth/Throat:     Pharynx: No oropharyngeal exudate or posterior oropharyngeal erythema.  Eyes:     Extraocular Movements: Extraocular movements intact.  Cardiovascular:     Rate and Rhythm: Normal rate.  Pulmonary:     Effort: Pulmonary effort is normal.  Musculoskeletal:     Cervical back: Normal range of motion.  Lymphadenopathy:     Cervical: No cervical adenopathy.  Skin:    General: Skin is warm and dry.  Neurological:     Mental Status: She is alert.    No results found for any visits on 05/18/23. Ear Cerumen Removal  Date/Time: 05/18/2023 4:19 PM  Performed by: Moshe Cipro, FNP Authorized by: Moshe Cipro, FNP   Anesthesia: Local Anesthetic: none Ceruminolytics applied: Ceruminolytics applied prior to the procedure. Location details: left ear Patient tolerance: patient tolerated the procedure well with no immediate complications Comments: Large amount hardened wax removed from the L ear canal, TM normal Procedure type: curette  Sedation: Patient sedated: no    Jolyne Loa, CMA attempted unsuccessful ear lavage, mechanical wax removal with curette by me       Assessment & Plan:   Impacted cerumen of left ear -     Ear Cerumen Removal    No orders of the defined types were placed in this encounter.   Return if symptoms worsen or fail to improve.  Moshe Cipro, FNP

## 2023-05-18 NOTE — Telephone Encounter (Signed)
*  STAT* If patient is at the pharmacy, call can be transferred to refill team.   1. Which medications need to be refilled? (please list name of each medication and dose if known) osartan (COZAAR) 25 MG tablet    2. Would you like to learn more about the convenience, safety, & potential cost savings by using the [facility]? No   3. Are you open to using the [facility] (Type [facility].  ) No   4. Which pharmacy/location (including street and city if local pharmacy) is medication to be sent to? [id number] - [place], [place] - [address]    5. Do they need a 30 day or 90 day supply? 90 day  Pt is going to schedule f/u with Dr. Cristal Deer once her new calendar is out

## 2023-05-18 NOTE — Telephone Encounter (Signed)
 Pt's medication was sent to pt's pharmacy as requested. Confirmation received.

## 2023-05-18 NOTE — Patient Instructions (Signed)
 We have cleaned out your ears in the office today.  You may use Debrox at home to try to help keep the wax from building up.  Follow-up with [place] as needed if you are having any persistent symptoms.

## 2023-05-20 ENCOUNTER — Telehealth: Payer: Self-pay | Admitting: Family

## 2023-05-20 DIAGNOSIS — E785 Hyperlipidemia, unspecified: Secondary | ICD-10-CM

## 2023-05-20 DIAGNOSIS — I2510 Atherosclerotic heart disease of native coronary artery without angina pectoris: Secondary | ICD-10-CM

## 2023-05-20 MED ORDER — LOSARTAN POTASSIUM 25 MG PO TABS: 25.0000 mg | ORAL_TABLET | Freq: Every day | ORAL | 1 refills | Status: DC

## 2023-05-20 NOTE — Telephone Encounter (Signed)
 Pt's medication was sent to pt's pharmacy as requested. Confirmation received.

## 2023-05-20 NOTE — Telephone Encounter (Signed)
*  STAT* If patient is at the pharmacy, call can be transferred to refill team.   1. Which medications need to be refilled? (please list name of each medication and dose if known) losartan (COZAAR) 25 MG tablet   2. Would you like to learn more about the convenience, safety, & potential cost savings by using the [facility]? No    3. Are you open to using the [facility] (Type [facility].  ). No   4. Which pharmacy/location (including street and city if local pharmacy) is medication to be sent to?[id number] - [place], [place] - [address] AT [address]    5. Do they need a 30 day or 90 day supply? 90 day

## 2023-07-14 ENCOUNTER — Telehealth: Payer: Self-pay | Admitting: Cardiology

## 2023-07-14 MED ORDER — ROSUVASTATIN CALCIUM 20 MG PO TABS: 20.0000 mg | ORAL_TABLET | Freq: Every day | ORAL | 0 refills | Status: DC

## 2023-07-14 NOTE — Telephone Encounter (Signed)
*  STAT* If patient is at the pharmacy, call can be transferred to refill team.   1. Which medications need to be refilled? (please list name of each medication and dose if known) rosuvastatin (CRESTOR) 20 MG tablet  2. Which pharmacy/location (including street and city if local pharmacy) is medication to be sent to? [id number] [place], [place] - [address]. Do they need a 30 day or 90 day supply? [address]

## 2023-07-14 NOTE — Telephone Encounter (Signed)
 RX sent to requested Pharmacy

## 2023-07-31 ENCOUNTER — Ambulatory Visit (HOSPITAL_BASED_OUTPATIENT_CLINIC_OR_DEPARTMENT_OTHER): Admitting: Family

## 2023-07-31 ENCOUNTER — Other Ambulatory Visit: Payer: Self-pay | Admitting: Internal Medicine

## 2023-08-07 DIAGNOSIS — I639 Cerebral infarction, unspecified: Secondary | ICD-10-CM

## 2023-08-07 HISTORY — DX: Cerebral infarction, unspecified: I63.9

## 2023-08-09 ENCOUNTER — Observation Stay (HOSPITAL_COMMUNITY)
Admission: EM | Admit: 2023-08-09 | Discharge: 2023-08-13 | DRG: 871 | Disposition: A | Discharge status: Rehabilitation Facility | Attending: Obstetrics and Gynecology | Admitting: Obstetrics and Gynecology

## 2023-08-09 ENCOUNTER — Encounter (HOSPITAL_COMMUNITY): Payer: Self-pay

## 2023-08-09 ENCOUNTER — Emergency Department (HOSPITAL_COMMUNITY)

## 2023-08-09 ENCOUNTER — Other Ambulatory Visit: Payer: Self-pay

## 2023-08-09 ENCOUNTER — Ambulatory Visit (HOSPITAL_COMMUNITY)
Admission: EM | Admit: 2023-08-09 | Discharge: 2023-08-09 | Disposition: A | Discharge status: Other Acute Inpatient Hospital | Attending: Physician Assistant | Admitting: Physician Assistant

## 2023-08-09 DIAGNOSIS — E871 Hypo-osmolality and hyponatremia: Secondary | ICD-10-CM | POA: Diagnosis present

## 2023-08-09 DIAGNOSIS — I10 Essential (primary) hypertension: Secondary | ICD-10-CM | POA: Diagnosis not present

## 2023-08-09 DIAGNOSIS — G9340 Encephalopathy, unspecified: Secondary | ICD-10-CM | POA: Diagnosis present

## 2023-08-09 DIAGNOSIS — R410 Disorientation, unspecified: Secondary | ICD-10-CM | POA: Diagnosis not present

## 2023-08-09 DIAGNOSIS — N3000 Acute cystitis without hematuria: Secondary | ICD-10-CM | POA: Diagnosis present

## 2023-08-09 DIAGNOSIS — A419 Sepsis, unspecified organism: Principal | ICD-10-CM | POA: Diagnosis present

## 2023-08-09 DIAGNOSIS — Z90710 Acquired absence of both cervix and uterus: Secondary | ICD-10-CM

## 2023-08-09 DIAGNOSIS — R29818 Other symptoms and signs involving the nervous system: Secondary | ICD-10-CM | POA: Diagnosis not present

## 2023-08-09 DIAGNOSIS — E782 Mixed hyperlipidemia: Secondary | ICD-10-CM | POA: Diagnosis not present

## 2023-08-09 DIAGNOSIS — I63531 Cerebral infarction due to unspecified occlusion or stenosis of right posterior cerebral artery: Secondary | ICD-10-CM | POA: Diagnosis present

## 2023-08-09 DIAGNOSIS — Z8781 Personal history of (healed) traumatic fracture: Secondary | ICD-10-CM

## 2023-08-09 DIAGNOSIS — Z79899 Other long term (current) drug therapy: Secondary | ICD-10-CM

## 2023-08-09 DIAGNOSIS — I639 Cerebral infarction, unspecified: Secondary | ICD-10-CM | POA: Diagnosis present

## 2023-08-09 DIAGNOSIS — Z8262 Family history of osteoporosis: Secondary | ICD-10-CM

## 2023-08-09 DIAGNOSIS — R6520 Severe sepsis without septic shock: Secondary | ICD-10-CM | POA: Diagnosis not present

## 2023-08-09 DIAGNOSIS — I69319 Unspecified symptoms and signs involving cognitive functions following cerebral infarction: Secondary | ICD-10-CM | POA: Diagnosis present

## 2023-08-09 DIAGNOSIS — R4182 Altered mental status, unspecified: Secondary | ICD-10-CM | POA: Diagnosis not present

## 2023-08-09 DIAGNOSIS — G902 Horner's syndrome: Secondary | ICD-10-CM | POA: Diagnosis not present

## 2023-08-09 DIAGNOSIS — R197 Diarrhea, unspecified: Secondary | ICD-10-CM | POA: Diagnosis not present

## 2023-08-09 DIAGNOSIS — I700 Atherosclerosis of aorta: Secondary | ICD-10-CM | POA: Diagnosis not present

## 2023-08-09 DIAGNOSIS — Z7982 Long term (current) use of aspirin: Secondary | ICD-10-CM

## 2023-08-09 DIAGNOSIS — F419 Anxiety disorder, unspecified: Secondary | ICD-10-CM | POA: Diagnosis present

## 2023-08-09 DIAGNOSIS — F32A Depression, unspecified: Secondary | ICD-10-CM | POA: Diagnosis present

## 2023-08-09 DIAGNOSIS — H5702 Anisocoria: Secondary | ICD-10-CM

## 2023-08-09 DIAGNOSIS — E785 Hyperlipidemia, unspecified: Secondary | ICD-10-CM | POA: Diagnosis present

## 2023-08-09 DIAGNOSIS — I6521 Occlusion and stenosis of right carotid artery: Secondary | ICD-10-CM

## 2023-08-09 DIAGNOSIS — R413 Other amnesia: Secondary | ICD-10-CM | POA: Diagnosis present

## 2023-08-09 DIAGNOSIS — G9341 Metabolic encephalopathy: Secondary | ICD-10-CM | POA: Diagnosis present

## 2023-08-09 DIAGNOSIS — Z882 Allergy status to sulfonamides status: Secondary | ICD-10-CM

## 2023-08-09 DIAGNOSIS — Z9081 Acquired absence of spleen: Secondary | ICD-10-CM

## 2023-08-09 DIAGNOSIS — E876 Hypokalemia: Secondary | ICD-10-CM | POA: Diagnosis not present

## 2023-08-09 DIAGNOSIS — Z87410 Personal history of cervical dysplasia: Secondary | ICD-10-CM

## 2023-08-09 DIAGNOSIS — I672 Cerebral atherosclerosis: Secondary | ICD-10-CM | POA: Diagnosis not present

## 2023-08-09 DIAGNOSIS — R296 Repeated falls: Secondary | ICD-10-CM

## 2023-08-09 DIAGNOSIS — H02401 Unspecified ptosis of right eyelid: Secondary | ICD-10-CM | POA: Diagnosis present

## 2023-08-09 DIAGNOSIS — R739 Hyperglycemia, unspecified: Secondary | ICD-10-CM | POA: Diagnosis not present

## 2023-08-09 DIAGNOSIS — R29700 NIHSS score 0: Secondary | ICD-10-CM | POA: Diagnosis present

## 2023-08-09 DIAGNOSIS — M810 Age-related osteoporosis without current pathological fracture: Secondary | ICD-10-CM | POA: Diagnosis present

## 2023-08-09 DIAGNOSIS — Z8679 Personal history of other diseases of the circulatory system: Secondary | ICD-10-CM | POA: Diagnosis not present

## 2023-08-09 DIAGNOSIS — I6501 Occlusion and stenosis of right vertebral artery: Secondary | ICD-10-CM | POA: Diagnosis not present

## 2023-08-09 DIAGNOSIS — I4719 Other supraventricular tachycardia: Secondary | ICD-10-CM | POA: Diagnosis present

## 2023-08-09 DIAGNOSIS — Z741 Need for assistance with personal care: Secondary | ICD-10-CM | POA: Diagnosis present

## 2023-08-09 DIAGNOSIS — W06XXXA Fall from bed, initial encounter: Secondary | ICD-10-CM | POA: Diagnosis present

## 2023-08-09 DIAGNOSIS — Z85828 Personal history of other malignant neoplasm of skin: Secondary | ICD-10-CM

## 2023-08-09 DIAGNOSIS — Z8249 Family history of ischemic heart disease and other diseases of the circulatory system: Secondary | ICD-10-CM

## 2023-08-09 DIAGNOSIS — Y92003 Bedroom of unspecified non-institutional (private) residence as the place of occurrence of the external cause: Secondary | ICD-10-CM

## 2023-08-09 DIAGNOSIS — R29810 Facial weakness: Secondary | ICD-10-CM | POA: Diagnosis present

## 2023-08-09 DIAGNOSIS — D580 Hereditary spherocytosis: Secondary | ICD-10-CM | POA: Diagnosis present

## 2023-08-09 DIAGNOSIS — I6523 Occlusion and stenosis of bilateral carotid arteries: Secondary | ICD-10-CM | POA: Diagnosis not present

## 2023-08-09 DIAGNOSIS — I2510 Atherosclerotic heart disease of native coronary artery without angina pectoris: Secondary | ICD-10-CM | POA: Diagnosis present

## 2023-08-09 DIAGNOSIS — Z880 Allergy status to penicillin: Secondary | ICD-10-CM

## 2023-08-09 DIAGNOSIS — I471 Supraventricular tachycardia, unspecified: Secondary | ICD-10-CM

## 2023-08-09 DIAGNOSIS — Z91048 Other nonmedicinal substance allergy status: Secondary | ICD-10-CM

## 2023-08-09 LAB — COMPREHENSIVE METABOLIC PANEL WITH GFR
ALT: 17 U/L (ref 0–44)
AST: 24 U/L (ref 15–41)
Albumin: 3.5 g/dL (ref 3.5–5.0)
Alkaline Phosphatase: 58 U/L (ref 38–126)
Anion gap: 13 (ref 5–15)
BUN: 14 mg/dL (ref 8–23)
CO2: 23 mmol/L (ref 22–32)
Calcium: 9.7 mg/dL (ref 8.9–10.3)
Chloride: 98 mmol/L (ref 98–111)
Creatinine, Ser: 0.94 mg/dL (ref 0.44–1.00)
GFR, Estimated: >60 mL/min (ref >60)
Glucose, Bld: 309 mg/dL — ABNORMAL HIGH (ref 70–99)
Potassium: 3.4 mmol/L — ABNORMAL LOW (ref 3.5–5.1)
Sodium: 134 mmol/L — ABNORMAL LOW (ref 135–145)
Total Bilirubin: 1.6 mg/dL — ABNORMAL HIGH (ref 0.0–1.2)
Total Protein: 6.9 g/dL (ref 6.5–8.1)

## 2023-08-09 LAB — CBC
HCT: 36.2 % (ref 36.0–46.0)
Hemoglobin: 12.6 g/dL (ref 12.0–15.0)
MCH: 34.8 pg — ABNORMAL HIGH (ref 26.0–34.0)
MCHC: 34.8 g/dL (ref 30.0–36.0)
MCV: 100 fL (ref 80.0–100.0)
Platelets: 335 10*3/uL (ref 150–400)
RBC: 3.62 MIL/uL — ABNORMAL LOW (ref 3.87–5.11)
RDW: 16.1 % — ABNORMAL HIGH (ref 11.5–15.5)
WBC: 16.7 10*3/uL — ABNORMAL HIGH (ref 4.0–10.5)
nRBC: 0 % (ref 0.0–0.2)

## 2023-08-09 LAB — POCT URINALYSIS DIP (MANUAL ENTRY)
Glucose, UA: >=1000 mg/dL — AB
Nitrite, UA: NEGATIVE
Protein Ur, POC: >=300 mg/dL — AB
Spec Grav, UA: >=1.03 — AB (ref 1.010–1.025)
Urobilinogen, UA: 1 U/dL
pH, UA: 5.5 (ref 5.0–8.0)

## 2023-08-09 LAB — I-STAT CHEM 8, ED
BUN: 16 mg/dL (ref 8–23)
Calcium, Ion: 1.2 mmol/L (ref 1.15–1.40)
Chloride: 97 mmol/L — ABNORMAL LOW (ref 98–111)
Creatinine, Ser: 0.8 mg/dL (ref 0.44–1.00)
Glucose, Bld: 300 mg/dL — ABNORMAL HIGH (ref 70–99)
HCT: 34 % — ABNORMAL LOW (ref 36.0–46.0)
Hemoglobin: 11.6 g/dL — ABNORMAL LOW (ref 12.0–15.0)
Potassium: 3.8 mmol/L (ref 3.5–5.1)
Sodium: 135 mmol/L (ref 135–145)
TCO2: 24 mmol/L (ref 22–32)

## 2023-08-09 LAB — APTT: aPTT: 24 s (ref 24–36)

## 2023-08-09 LAB — URINALYSIS, W/ REFLEX TO CULTURE (INFECTION SUSPECTED)
Bilirubin Urine: NEGATIVE
Glucose, UA: >=500 mg/dL — AB
Ketones, ur: NEGATIVE mg/dL
Nitrite: NEGATIVE
Protein, ur: 100 mg/dL — AB
Specific Gravity, Urine: 1.028 (ref 1.005–1.030)
WBC, UA: >50 WBC/hpf (ref 0–5)
pH: 5 (ref 5.0–8.0)

## 2023-08-09 LAB — CBG MONITORING, ED: Glucose-Capillary: 205 mg/dL — ABNORMAL HIGH (ref 70–99)

## 2023-08-09 LAB — DIFFERENTIAL
Abs Immature Granulocytes: 0.09 10*3/uL — ABNORMAL HIGH (ref 0.00–0.07)
Basophils Absolute: 0.1 10*3/uL (ref 0.0–0.1)
Basophils Relative: 0 %
Eosinophils Absolute: 0 10*3/uL (ref 0.0–0.5)
Eosinophils Relative: 0 %
Immature Granulocytes: 1 %
Lymphocytes Relative: 5 %
Lymphs Abs: 0.8 10*3/uL (ref 0.7–4.0)
Monocytes Absolute: 1.7 10*3/uL — ABNORMAL HIGH (ref 0.1–1.0)
Monocytes Relative: 10 %
Neutro Abs: 14.1 10*3/uL — ABNORMAL HIGH (ref 1.7–7.7)
Neutrophils Relative %: 84 %

## 2023-08-09 LAB — PROTIME-INR
INR: 1 (ref 0.8–1.2)
Prothrombin Time: 13.6 s (ref 11.4–15.2)

## 2023-08-09 LAB — ETHANOL: Alcohol, Ethyl (B): <15 mg/dL (ref <15)

## 2023-08-09 MED ORDER — SODIUM CHLORIDE 0.9% FLUSH: 3.0000 mL | Freq: Once | INTRAVENOUS | Status: DC

## 2023-08-09 MED ORDER — SODIUM CHLORIDE 0.9 % IV SOLN
1.0000 g | Freq: Once | INTRAVENOUS | Status: AC
  Administered 2023-08-09: 1 g via INTRAVENOUS
  Filled 2023-08-09: qty 10

## 2023-08-09 MED ORDER — ACETAMINOPHEN 650 MG RE SUPP
650.0000 mg | Freq: Four times a day (QID) | RECTAL | Status: DC | PRN
Start: 2023-08-09 — End: 2023-08-13

## 2023-08-09 MED ORDER — IOHEXOL 350 MG/ML SOLN
75.0000 mL | Freq: Once | INTRAVENOUS | Status: AC | PRN
  Administered 2023-08-09: 75 mL via INTRAVENOUS

## 2023-08-09 MED ORDER — ONDANSETRON HCL 4 MG/2ML IJ SOLN: 4.0000 mg | Freq: Four times a day (QID) | INTRAMUSCULAR | Status: DC | PRN

## 2023-08-09 MED ORDER — MELATONIN 3 MG PO TABS: 3.0000 mg | ORAL_TABLET | Freq: Every evening | ORAL | Status: DC | PRN

## 2023-08-09 MED ORDER — SODIUM CHLORIDE 0.9 % IV SOLN
1.0000 g | INTRAVENOUS | Status: AC
  Administered 2023-08-10 – 2023-08-12 (×3): 1 g via INTRAVENOUS
  Filled 2023-08-09 (×3): qty 10

## 2023-08-09 MED ORDER — ACETAMINOPHEN 325 MG PO TABS
650.0000 mg | ORAL_TABLET | Freq: Four times a day (QID) | ORAL | Status: DC | PRN
Start: 2023-08-09 — End: 2023-08-13
  Filled 2023-08-09: qty 2

## 2023-08-09 MED ORDER — LACTATED RINGERS IV BOLUS
1000.0000 mL | Freq: Once | INTRAVENOUS | Status: AC
  Administered 2023-08-09: 1000 mL via INTRAVENOUS

## 2023-08-09 NOTE — ED Provider Notes (Signed)
 [facility] Provider Note   CSN: [id number] Arrival date & time: 08/09/23  1730     History Chief Complaint  Patient presents with   Extremity Weakness   Altered Mental Status    Alicia Wilkerson is a 73 y.o. female w/ PMHx CAD, hypertension, hyperlipidemia, hearing loss, anxiety, who presents to the ED for evaluation of AMS and multiple falls.  Patient presents today companied by her daughter who help provide the majority of history.  She reports approximately 3 to 4 days ago she had episode of palpitations in [employer] prompting EMS to be called.  Patient refused to go to the emergency room.  She reports the following day she bent over to pick something off the floor fell forward onto her head.  She did not lose consciousness and was able to stand up on her own.  Patient states later that evening she rolled out of bed onto her nightstand.  She also reports hitting her head on her nightstand.  She did not lose consciousness.  Daughter FaceTime patient was concerned that she had facial drooping.  Daughter reports that she has also been more confused since these events.  Patient has no complaints at this time.     Physical Exam Updated Vital Signs BP (!) 140/74   Pulse 60   Temp 98 F (36.7 C)   Resp (!) 22   Ht 5' 3 (1.6 m)   Wt 55.3 kg   LMP 02/24/2002   SpO2 100%   BMI 21.61 kg/m  Physical Exam Vitals and nursing note reviewed.  Constitutional:      General: She is not in acute distress.    Appearance: She is well-developed. She is not ill-appearing.  HENT:     Head: Normocephalic and atraumatic.     Nose: Nose normal.     Mouth/Throat:     Mouth: Mucous membranes are moist.   Eyes:     Extraocular Movements: Extraocular movements intact.     Conjunctiva/sclera: Conjunctivae normal.     Comments: Right pupil approximately 1 mm smaller than left.  Normal reactivity.  Mild right proptosis and right enophthalmos.  No periorbital  step-offs tenderness or deformities.   Cardiovascular:     Rate and Rhythm: Normal rate and regular rhythm.     Pulses: Normal pulses.     Heart sounds: Normal heart sounds. No murmur heard. Pulmonary:     Effort: Pulmonary effort is normal. No respiratory distress.     Breath sounds: Normal breath sounds.  Abdominal:     Palpations: Abdomen is soft.     Tenderness: There is no abdominal tenderness.   Musculoskeletal:        General: No swelling or deformity.     Cervical back: Neck supple.   Skin:    General: Skin is warm and dry.     Capillary Refill: Capillary refill takes less than 2 seconds.     Comments: Small bruise to left anterior forehead   Neurological:     General: No focal deficit present.     Mental Status: She is alert. Mental status is at baseline.     Cranial Nerves: No cranial nerve deficit.     Sensory: No sensory deficit.     Motor: No weakness.     Coordination: Coordination normal.   Psychiatric:        Mood and Affect: Mood normal.     ED Results / Procedures / Treatments  Labs (all labs ordered are listed, but only abnormal results are displayed) Labs Reviewed  CBC - Abnormal; Notable for the following components:      Result Value   WBC 16.7 (*)    RBC 3.62 (*)    MCH 34.8 (*)    RDW 16.1 (*)    All other components within normal limits  DIFFERENTIAL - Abnormal; Notable for the following components:   Neutro Abs 14.1 (*)    Monocytes Absolute 1.7 (*)    Abs Immature Granulocytes 0.09 (*)    All other components within normal limits  COMPREHENSIVE METABOLIC PANEL WITH GFR - Abnormal; Notable for the following components:   Sodium 134 (*)    Potassium 3.4 (*)    Glucose, Bld 309 (*)    Total Bilirubin 1.6 (*)    All other components within normal limits  URINALYSIS, W/ REFLEX TO CULTURE (INFECTION SUSPECTED) - Abnormal; Notable for the following components:   Color, Urine AMBER (*)    APPearance CLOUDY (*)    Glucose, UA >=500 (*)     Hgb urine dipstick SMALL (*)    Protein, ur 100 (*)    Leukocytes,Ua LARGE (*)    Bacteria, UA RARE (*)    All other components within normal limits  I-STAT CHEM 8, ED - Abnormal; Notable for the following components:   Chloride 97 (*)    Glucose, Bld 300 (*)    Hemoglobin 11.6 (*)    HCT 34.0 (*)    All other components within normal limits  CBG MONITORING, ED - Abnormal; Notable for the following components:   Glucose-Capillary 205 (*)    All other components within normal limits  PROTIME-INR  APTT  ETHANOL    EKG None  Radiology CT ANGIO HEAD NECK W WO CM Result Date: 08/09/2023 CLINICAL DATA:  Initial evaluation for acute diplopia, Horner syndrome. EXAM: CT ANGIOGRAPHY HEAD AND NECK WITH AND WITHOUT CONTRAST TECHNIQUE: Multidetector CT imaging of the head and neck was performed using the standard protocol during bolus administration of intravenous contrast. Multiplanar CT image reconstructions and MIPs were obtained to evaluate the vascular anatomy. Carotid stenosis measurements (when applicable) are obtained utilizing NASCET criteria, using the distal internal carotid diameter as the denominator. RADIATION DOSE REDUCTION: This exam was performed according to the departmental dose-optimization program which includes automated exposure control, adjustment of the mA and/or kV according to patient size and/or use of iterative reconstruction technique. CONTRAST:  75mL OMNIPAQUE  IOHEXOL  350 MG/ML SOLN COMPARISON:  CT from earlier the same day. FINDINGS: CTA NECK FINDINGS Aortic arch: Visualized aortic arch within normal limits for caliber. Origin of the great vessels incompletely visualized on this exam. Mild aortic atherosclerosis. Right carotid system: Right CCA tortuous but patent without stenosis. Atheromatous change about the right carotid bulb. There is occlusion of the right ICA just distal to the bifurcation (series 7, image 110). Right ICA remains occluded to the terminus. Left  carotid system: Left common and internal carotid arteries are tortuous but patent without dissection. Mild atheromatous change about the left carotid bulb without hemodynamically significant stenosis. Vertebral arteries: Left vertebral artery hypoplastic and likely arises from the aortic arch, although the origin is not visualized. Atheromatous change at the origin of the dominant right vertebral artery with mild stenosis. Visualized vertebral arteries patent without stenosis or dissection. Skeleton: No worrisome osseous lesions. Moderate multilevel cervical spondylosis, most pronounced at C5-6 and C6-7. Other neck: No other acute finding. Upper chest: Small area of reticulonodular tree-in-bud  densities within the left upper lobe, likely mild infection/small airways disease (series 7, image 138). No other acute finding. Review of the MIP images confirms the above findings CTA HEAD FINDINGS Anterior circulation: For atheromatous change about the left carotid siphon without hemodynamically significant stenosis. Right ICA remains occluded to the terminus. A1 segments patent bilaterally. Left A1 dominant. Normal anterior communicating artery complex. Anterior cerebral arteries patent without stenosis. Left M1 segment and distal left MCA branches are widely patent and well perfused. Revascularization of the right MCA via collateral flow across the circle-of-Willis. Right MCA patent and perfused. Posterior circulation: Dominant right V4 segment patent without stenosis. Right PICA patent. Hypoplastic left vertebral artery largely terminates in PICA, although a tiny branch ascending towards the vertebrobasilar junction. Left PICA patent as well. Basilar patent without stenosis. Superior cerebral arteries patent bilaterally. Left PCA supplied via a hypoplastic left P1 segment and prominent left posterior communicating artery. Left PCA patent to its distal aspect without significant stenosis. Fetal type origin of the right  PCA with severe stenosis versus occlusion at the origin of the right PCOM (series 8, image 107). Right PCA attenuated but patent distally without visible stenosis. Venous sinuses: Patent allowing for timing the contrast bolus. Anatomic variants: As above.  No aneurysm. Review of the MIP images confirms the above findings IMPRESSION: 1. Occlusion of the right ICA just distal to the bifurcation, age indeterminate, but presumably acute given patient's symptoms. Right ICA remains occluded to the terminus. Revascularization of the right MCA via collateral flow across the circle-of-Willis. 2. Fetal type origin of the right PCA with severe stenosis versus occlusion at the origin of the right PCOM. Right PCA attenuated but patent distally. 3. Mild atheromatous change about the left carotid bulb and left carotid siphon without hemodynamically significant stenosis. 4. Small area of reticulonodular tree-in-bud densities within the left upper lobe, likely mild infection/small airways disease. 5.  Aortic Atherosclerosis (ICD10-I70.0). Critical Value/emergent results were called by telephone at the time of interpretation on 08/09/2023 at 10:40 pm to provider DAN FLOYD , who verbally acknowledged these results. Electronically Signed   By: Virgia Griffins M.D.   On: 08/09/2023 22:43   DG Chest 2 View Result Date: 08/09/2023 EXAM: 2 VIEW(S) XRAY OF THE CHEST 08/09/2023 09:22:00 PM COMPARISON: 02/23/2022 CLINICAL HISTORY: r/o mass. AMS FINDINGS: LUNGS AND PLEURA: Mild lingular and right middle lobe scarring, chronic. Calcified granuloma in the left lower lung. No pulmonary edema. No pleural effusion. No pneumothorax. HEART AND MEDIASTINUM: No acute abnormality of the cardiac and mediastinal silhouettes. Thoracic aortic atherosclerosis. BONES AND SOFT TISSUES: No acute osseous abnormality. IMPRESSION: 1. No acute findings. Electronically signed by: Zadie Herter MD 08/09/2023 09:28 PM EDT RP Workstation: [id number]   CT  HEAD WO CONTRAST Result Date: 08/09/2023 CLINICAL DATA:  Neuro deficit, acute, stroke suspected; Facial trauma, blunt EXAM: CT HEAD WITHOUT CONTRAST CT MAXILLOFACIAL WITHOUT CONTRAST TECHNIQUE: Multidetector CT imaging of the head and maxillofacial structures were performed using the standard protocol without intravenous contrast. Multiplanar CT image reconstructions of the maxillofacial structures were also generated. RADIATION DOSE REDUCTION: This exam was performed according to the departmental dose-optimization program which includes automated exposure control, adjustment of the mA and/or kV according to patient size and/or use of iterative reconstruction technique. COMPARISON:  None Available. FINDINGS: CT HEAD FINDINGS Brain: Atherosclerotic calcifications are present within the cavernous internal carotid arteries. No evidence of large-territorial acute infarction. No parenchymal hemorrhage. No mass lesion. No extra-axial collection. No mass effect or midline shift. No hydrocephalus.  Basilar cisterns are patent. Vascular: No hyperdense vessel. Atherosclerotic calcifications are present within the cavernous internal carotid arteries. Skull: No acute fracture or focal lesion. Other: None. CT MAXILLOFACIAL FINDINGS Osseous: No fracture or mandibular dislocation. No destructive process. Sinuses/Orbits: Right maxillary sinus mucosal thickening. Paranasal sinuses and mastoid air cells are clear. The orbits are unremarkable. Soft tissues: Negative. Visualized upper cervical spine: Degenerative changes. IMPRESSION: 1. No acute intracranial abnormality. 2.  No acute displaced facial fracture. Electronically Signed   By: Morgane  Naveau M.D.   On: 08/09/2023 20:29   CT Maxillofacial Wo Contrast Result Date: 08/09/2023 CLINICAL DATA:  Neuro deficit, acute, stroke suspected; Facial trauma, blunt EXAM: CT HEAD WITHOUT CONTRAST CT MAXILLOFACIAL WITHOUT CONTRAST TECHNIQUE: Multidetector CT imaging of the head and  maxillofacial structures were performed using the standard protocol without intravenous contrast. Multiplanar CT image reconstructions of the maxillofacial structures were also generated. RADIATION DOSE REDUCTION: This exam was performed according to the departmental dose-optimization program which includes automated exposure control, adjustment of the mA and/or kV according to patient size and/or use of iterative reconstruction technique. COMPARISON:  None Available. FINDINGS: CT HEAD FINDINGS Brain: Atherosclerotic calcifications are present within the cavernous internal carotid arteries. No evidence of large-territorial acute infarction. No parenchymal hemorrhage. No mass lesion. No extra-axial collection. No mass effect or midline shift. No hydrocephalus. Basilar cisterns are patent. Vascular: No hyperdense vessel. Atherosclerotic calcifications are present within the cavernous internal carotid arteries. Skull: No acute fracture or focal lesion. Other: None. CT MAXILLOFACIAL FINDINGS Osseous: No fracture or mandibular dislocation. No destructive process. Sinuses/Orbits: Right maxillary sinus mucosal thickening. Paranasal sinuses and mastoid air cells are clear. The orbits are unremarkable. Soft tissues: Negative. Visualized upper cervical spine: Degenerative changes. IMPRESSION: 1. No acute intracranial abnormality. 2.  No acute displaced facial fracture. Electronically Signed   By: Morgane  Naveau M.D.   On: 08/09/2023 20:29    Medications Ordered in ED Medications  sodium chloride  flush (NS) 0.9 % injection 3 mL (3 mLs Intravenous Not Given 08/09/23 1832)  lactated ringers  bolus 1,000 mL (1,000 mLs Intravenous New Bag/Given 08/09/23 1835)  iohexol  (OMNIPAQUE ) 350 MG/ML injection 75 mL (75 mLs Intravenous Contrast Given 08/09/23 2159)  cefTRIAXone (ROCEPHIN) 1 g in sodium chloride  0.9 % 100 mL IVPB (0 g Intravenous Stopped 08/09/23 2309)    ED Course/ Medical Decision Making/ A&P  AMYLA HEFFNER is a  73 y.o. female presents as detailed above  Differential ddx: Intracranial abnormality, facial fracture, UTI, electrolyte abnormalities, dehydration, contusion, concussion,   On arrival, patient afebrile hemodynamically stable no hypoxia or respiratory distress.  Patient found to have leukocytosis.  Mild hyponatremia hypokalemia.  No AKI. No acute abnormality on CT head without contrast.  No significant facial fractures or other facial abnormalities.  On reassessment concern for Horner syndrome due to miosis proptosis and enophthalmos.  Discussed with neurology who recommends CTA head and neck. CT head neck ordered.  Urinalysis resulted with urinary tract infection.  Patient received Rocephin.  CTA head and neck found to have occlusion of right ICA which is age indeterminate but concern for acute due to new onset of patient's symptoms. Findings discussed with neurology who recommends MRI brain. MRI ordered  Due to multiple falls confusion urinary tract infection and possible acute ICA occlusion believe patient would benefit from inpatient admission. Case discussed with hospitalist team who agrees with admission.   Patient seen with supervising physician who agrees with plan.  Final Clinical Impression(s) / ED Diagnoses Final diagnoses:  Confusion  Acute cystitis without hematuria  Horner syndrome     Angele Keller, DO PGY-3 Emergency Medicine    Angele Keller, DO 08/09/23 2345    Albertus Hughs, DO 08/10/23 2324

## 2023-08-09 NOTE — ED Notes (Signed)
 Patient returned from CT

## 2023-08-09 NOTE — ED Notes (Signed)
 Patient transported to CT

## 2023-08-09 NOTE — ED Provider Notes (Signed)
 [facility]    CSN: [id number] Arrival date & time: 08/09/23  1558      History   Chief Complaint Chief Complaint  Patient presents with   Hypertension    HPI Alicia Wilkerson is a 73 y.o. female.   Patient presents today companied by her daughter who help provide the majority of history.  Reports that a few days ago she had an episode where she fell in the kitchen hitting her head.  She then had another episode where she rolled out of bed and hit her head on the corner of her and table.  She denies any loss of consciousness, headache, dizziness, nausea, vomiting.  Does report that she felt weak when she was at [employer] a few days later and had to sit down where her blood pressure was noted to be 170/100.  They attempted to call an ambulance to take her to the emergency room but she declined this.  Her daughter has been in close contact with her and reports that she has had several episodes where she has not been acting normally and has been more confused.  This is an acute change from her baseline as she has no history of cognitive impairment.  She does not take any blood thinning medication other than a baby aspirin .  Her daughter flew down in order to assess her and reports that she is not acting her normal self.  They did attempt to call an ambulance yesterday who recommended emergency room evaluation but patient adamantly declined after getting very angry according to daughter.  She denies any medication changes that she does have several sedating medicines listed on her chart but reports using it as needed and has not had them recently.  Denies any recent illness or additional symptoms.    Past Medical History:  Diagnosis Date   Anemia    Anxiety disorder    Basal cell carcinoma (BCC) of left upper arm 12/2015   Basal cell carcinoma (BCC) of right lower leg 04/2016   Borderline hypertension    CAD (coronary artery disease)    a. nonobst by cor CT 12/2018.   CIN II  (cervical intraepithelial neoplasia II)    CIN  1   Depression    Eczema    Family history of premature CAD    History of basal cell carcinoma (BCC) excision    left upper arm 2017;   right lower leg 2018;  bilateral lower leg and hip area 2019;   inner right lower leg, outer right thigh, & upper outside left arm 09/ 2020   History of cervical dysplasia 2013   History of hereditary spherocytosis    s/p splenectomy in 1954   History of hyperparathyroidism    per pt yrs ago was put on mega dose of vit d which caused the hyperparathyroid resolved after stopping vit d   History of pelvic fracture 2013   MAI (mycobacterium avium-intracellulare) (HCC)    ? possibly by CT 12/2018   Mild hyperlipidemia    Osteoporosis    PONV (postoperative nausea and vomiting)    S/P hernia repair 03/02/2023   S/P splenectomy    spherocytosis   Wears glasses    White coat syndrome without diagnosis of hypertension     Patient Active Problem List   Diagnosis Date Noted   Left inguinal hernia 01/02/2023   Lower back pain 01/02/2023   Neck pain, musculoskeletal 12/15/2022   Vitamin D  deficiency 11/24/2022   Rash and  nonspecific skin eruption 09/04/2022   Hypertension 11/18/2021   Family history of malignant neoplasm of gastrointestinal tract 08/14/2020   Acute hearing loss of left ear 08/14/2020   Aortic atherosclerosis (HCC) 11/04/2019   Hyperlipidemia 11/03/2019   CAD (coronary artery disease) 11/03/2019   S/P total hysterectomy and bilateral salpingo-oophorectomy, 2021 11/03/2019   Hyperglycemia 10/19/2018   Abnormal EKG 04/26/2018   Hearing loss 10/13/2017   Basal cell carcinoma (BCC) of skin of lower extremity including hip 05/19/2017   Anxiety 10/10/2016   Eczema 10/10/2016   H/O splenectomy for hemolytic spherocytosis 10/10/2016   H/O basal cell carcinoma excision 10/10/2016   History of cervical dysplasia 08/21/2016    Past Surgical History:  Procedure Laterality Date   ANKLE SURGERY   03/14/2015   BREAST BIOPSY Left 08/30/2021   CYSTIC APOCRINE METAPLASIA   CERVICAL CONIZATION W/BX N/A 12/13/2018   Procedure: CONIZATION CERVIX WITH BIOPSY;  Surgeon: Lillian Rein, MD;  Location[facility] OR;  Service: Gynecology;  Laterality: N/A;   CYSTOSCOPY N/A 10/18/2019   Procedure: CYSTOSCOPY;  Surgeon: Lillian Rein, MD;  Location: [facility];  Service: Gynecology;  Laterality: N/A;   IR RADIOLOGY PERIPHERAL GUIDED IV START  01/05/2019   IR US  GUIDE VASC ACCESS RIGHT  01/05/2019   KNEE SURGERY Left 1991   per pt retained hardward   LEEP N/A 12/13/2018   Procedure: possible LOOP ELECTROSURGICAL EXCISION PROCEDURE (LEEP);  Surgeon: Lillian Rein, MD;  Location: [facility] OR;  Service: Gynecology;  Laterality: N/A;   ORIF ANKLE FRACTURE Left 03/14/2015   per pt retained hardware   ORIF WRIST FRACTURE Left 03/10/2022   Procedure: OPEN REDUCTION INTERNAL FIXATION (ORIF) WRIST FRACTURE;  Surgeon: Arvil Birks, MD;  Location[facility];  Service: Orthopedics;  Laterality: Left;  regional with iv sedation   SPLENECTOMY, TOTAL  1954   due to spherocytosis   TOTAL LAPAROSCOPIC HYSTERECTOMY WITH SALPINGECTOMY N/A 10/18/2019   Procedure: TOTAL LAPAROSCOPIC HYSTERECTOMY WITH BILATERAL SALPINGECTOMY AND BILATERAL OOPHORECTOMY;  Surgeon: Lillian Rein, MD;  Location: [facility];  Service: Gynecology;  Laterality: N/A;  BILATERAL SALPINGECTOMY POSS OOPHORECTOMY   TUBAL LIGATION Bilateral 1995    OB History     Gravida  2   Para  2   Term  2   Preterm      AB      Living  3      SAB      IAB      Ectopic      Multiple      Live Births               Home Medications    Prior to Admission medications   Medication Sig Start Date End Date Taking? Authorizing Provider  acetaminophen  (TYLENOL ) 500 MG tablet Take 1,000-1,300 mg by mouth every 6 (six) hours as needed for moderate pain.   Yes [provider]  ASPIRIN  81 PO Take 81 mg by mouth  daily.   Yes [provider]  clonazePAM  (KLONOPIN ) 0.5 MG tablet 1 tab by mouth once daily as needed 04/13/23  Yes Roslyn Coombe, MD  cyclobenzaprine  (FLEXERIL ) 5 MG tablet Take 1-2 tablets (5-10 mg total) by mouth 3 (three) times daily as needed for muscle spasms. 01/02/23  Yes Burns, Beckey Bourgeois, MD  escitalopram  (LEXAPRO ) 10 MG tablet TAKE ONE TABLET BY MOUTH ONCE A DAY 07/31/23  Yes Burns, Beckey Bourgeois, MD  halobetasol  (ULTRAVATE ) 0.05 % ointment Apply 1 Application  topically daily as needed (irritation). 11/21/21  Yes [provider]  losartan  (COZAAR ) 25 MG tablet Take 1 tablet (25 mg total) by mouth daily. 05/20/23  Yes Clearnce Curia, NP  methocarbamol  (ROBAXIN ) 500 MG tablet Take 1 tablet (500 mg total) by mouth 4 (four) times daily. 12/15/22  Yes Burns, Beckey Bourgeois, MD  NONFORMULARY OR COMPOUNDED ITEM Vitamin E vaginal suppositories 200u/ml.  One pv three times weekly. 12/30/22  Yes Lillian Rein, MD  Omega-3 Fatty Acids (FISH OIL PO) Take 1,300 capsules by mouth daily.   Yes [provider]  rosuvastatin  (CRESTOR ) 20 MG tablet Take 1 tablet (20 mg total) by mouth daily. 07/14/23  Yes Swinyer, Leilani Punter, NP  triamcinolone  ointment (KENALOG ) 0.5 % Apply 1 Application topically 2 (two) times daily. 02/24/23  Yes Lillian Rein, MD  promethazine  (PHENERGAN ) 12.5 MG tablet Take 1 tablet (12.5 mg total) by mouth every 4 (four) hours as needed for nausea or vomiting. 11/25/22   Colene Dauphin, MD    Family History Family History  Problem Relation Age of Onset   Osteoporosis Mother    Colon cancer Mother        deceased   Heart failure Father    Skin cancer Father    Hypertension Father    Heart disease Father    Heart disease Sister    Crohn's disease Daughter    Breast cancer Neg Hx     Social History Social History   Tobacco Use   Smoking status: Never   Smokeless tobacco: Never  Vaping Use   Vaping status: Never Used  Substance Use Topics   Alcohol use: Not  Currently   Drug use: No     Allergies   Penicillins, Sulfa antibiotics, Nickel, and Penicillin g   Review of Systems Review of Systems  Constitutional:  Positive for activity change. Negative for appetite change, fatigue and fever.  HENT:  Negative for congestion, sinus pressure, sneezing and sore throat.   Eyes:  Negative for photophobia and visual disturbance.  Respiratory:  Positive for cough. Negative for shortness of breath.   Cardiovascular:  Negative for chest pain.  Gastrointestinal:  Negative for abdominal pain, diarrhea, nausea and vomiting.  Musculoskeletal:  Negative for arthralgias and myalgias.  Neurological:  Negative for dizziness, seizures, syncope, facial asymmetry, speech difficulty, weakness, light-headedness and headaches.  Psychiatric/Behavioral:  Positive for confusion.      Physical Exam Triage Vital Signs ED Triage Vitals  Encounter Vitals Group     BP 08/09/23 1609 (!) 141/69     Girls Systolic BP Percentile --      Girls Diastolic BP Percentile --      Boys Systolic BP Percentile --      Boys Diastolic BP Percentile --      Pulse Rate 08/09/23 1609 64     Resp 08/09/23 1609 17     Temp 08/09/23 1609 98 F (36.7 C)     Temp Source 08/09/23 1609 Oral     SpO2 08/09/23 1609 97 %     Weight 08/09/23 1607 122 lb (55.3 kg)     Height 08/09/23 1607 5' 3 (1.6 m)     Head Circumference --      Peak Flow --      Pain Score 08/09/23 1607 0     Pain Loc --      Pain Education --      Exclude from Growth Chart --    No data  found.  Updated Vital Signs BP (!) 141/69 (BP Location: Right Arm)   Pulse 64   Temp 98 F (36.7 C) (Oral)   Resp 17   Ht 5' 3 (1.6 m)   Wt 122 lb (55.3 kg)   LMP 02/24/2002   SpO2 97%   BMI 21.61 kg/m   Visual Acuity Right Eye Distance:   Left Eye Distance:   Bilateral Distance:    Right Eye Near:   Left Eye Near:    Bilateral Near:     Physical Exam Vitals reviewed.  Constitutional:      General: She is  awake. She is not in acute distress.    Appearance: Normal appearance. She is well-developed. She is not ill-appearing.     Comments: Very pleasant female appears stated age no acute distress sitting comfortably in exam room  HENT:     Head: Normocephalic and atraumatic. No raccoon eyes, Battle's sign or contusion.      Right Ear: Tympanic membrane, ear canal and external ear normal. No hemotympanum.     Left Ear: Tympanic membrane, ear canal and external ear normal. No hemotympanum.     Mouth/Throat:     Lips: Pink.     Tongue: Tongue does not deviate from midline.   Eyes:     Extraocular Movements: Extraocular movements intact.     Pupils: Pupils are unequal.     Right eye: Pupil is round, reactive and not sluggish.     Left eye: Pupil is round, reactive and not sluggish.    Cardiovascular:     Rate and Rhythm: Normal rate. Rhythm irregularly irregular.     Heart sounds: Normal heart sounds, S1 normal and S2 normal. No murmur heard. Pulmonary:     Effort: Pulmonary effort is normal.     Breath sounds: Examination of the right-lower field reveals rales. Examination of the left-lower field reveals rales. Rales present. No wheezing or rhonchi.     Comments: Bibasilar rales  Musculoskeletal:     Cervical back: Normal range of motion and neck supple.     Comments: Strength 5/5 bilateral upper and lower extremities  Lymphadenopathy:     Head:     Right side of head: No submental, submandibular or tonsillar adenopathy.     Left side of head: No submental, submandibular or tonsillar adenopathy.   Neurological:     General: No focal deficit present.     Mental Status: She is alert and oriented to person, place, and time.     Cranial Nerves: Cranial nerves 2-12 are intact.     Motor: Motor function is intact.     Coordination: Coordination is intact.     Gait: Gait is intact.   Psychiatric:        Behavior: Behavior is cooperative.      UC Treatments / Results  Labs (all  labs ordered are listed, but only abnormal results are displayed) Labs Reviewed  POCT URINALYSIS DIP (MANUAL ENTRY) - Abnormal; Notable for the following components:      Result Value   Color, UA brown (*)    Glucose, UA >=1,000 (*)    Bilirubin, UA large (*)    Ketones, POC UA small (15) (*)    Spec Grav, UA >=1.030 (*)    Blood, UA moderate (*)    Protein Ur, POC >=300 (*)    Leukocytes, UA Trace (*)    All other components within normal limits  POCT FASTING CBG [facility] MANUAL ENTRY  EKG   Radiology No results found.  Procedures Procedures (including critical care time)  Medications Ordered in UC Medications - No data to display  Initial Impression / Assessment and Plan / UC Course  I have reviewed the triage vital signs and the nursing notes.  Pertinent labs & imaging results that were available during my care of the patient were reviewed by me and considered in my medical decision making (see chart for details).     Patient is well-appearing with no focal neurological defect on exam and her vital signs are stable in clinic.  She does have unequal pupils but is unclear if this is an acute finding.  We discussed that given her confusion and concern for head injury it would be worthwhile to go to the emergency room.  She was initially hesitant to do so and so we started workup including UA, CBG, EKG.  After sitting waiting for additional testing to be done daughter approached the nursing station indicating that they were just going to go to the emergency room.  We discussed that this is appropriate given her symptoms and so her daughter will take her directly to [facility] ER for further evaluation and management.  She was stable at time of discharge and safe for private transport.   Final Clinical Impressions(s) / UC Diagnoses   Final diagnoses:  Confusion  Repeated falls  Unequal pupils   Discharge Instructions   None    ED Prescriptions   None    PDMP not  reviewed this encounter.   Budd Cargo, PA-C 08/09/23 1710

## 2023-08-09 NOTE — H&P (Signed)
 History and Physical      Alicia Wilkerson:[id number] DOB: April 05, 1950 DOA: 08/09/2023; DOS: 08/09/2023  PCP: Colene Dauphin, MD *** Patient coming from: home ***  I have personally briefly reviewed patient's old medical records in [facility]  Chief Complaint: ***  HPI: Alicia Wilkerson is a 73 y.o. female with medical history significant for *** who is admitted to [facility] on 08/09/2023 with *** after presenting from home*** to [facility] ED complaining of ***.    ***       ***   ED Course:  Vital signs in the ED were notable for the following: ***  Labs were notable for the following: ***  Per my interpretation, EKG in ED demonstrated the following:  ***  Imaging in the ED, per corresponding formal radiology read, was notable for the following:  ***  While in the ED, the following were administered: ***  Subsequently, the patient was admitted  ***  ***red    Review of Systems: As per HPI otherwise 10 point review of systems negative.   Past Medical History:  Diagnosis Date   Anemia    Anxiety disorder    Basal cell carcinoma (BCC) of left upper arm 12/2015   Basal cell carcinoma (BCC) of right lower leg 04/2016   Borderline hypertension    CAD (coronary artery disease)    a. nonobst by cor CT 12/2018.   CIN II (cervical intraepithelial neoplasia II)    CIN  1   Depression    Eczema    Family history of premature CAD    History of basal cell carcinoma (BCC) excision    left upper arm 2017;   right lower leg 2018;  bilateral lower leg and hip area 2019;   inner right lower leg, outer right thigh, & upper outside left arm 09/ 2020   History of cervical dysplasia 2013   History of hereditary spherocytosis    s/p splenectomy in 1954   History of hyperparathyroidism    per pt yrs ago was put on mega dose of vit d which caused the hyperparathyroid resolved after stopping vit d   History of pelvic fracture 2013   MAI (mycobacterium  avium-intracellulare) (HCC)    ? possibly by CT 12/2018   Mild hyperlipidemia    Osteoporosis    PONV (postoperative nausea and vomiting)    S/P hernia repair 03/02/2023   S/P splenectomy    spherocytosis   Wears glasses    White coat syndrome without diagnosis of hypertension     Past Surgical History:  Procedure Laterality Date   ANKLE SURGERY  03/14/2015   BREAST BIOPSY Left 08/30/2021   CYSTIC APOCRINE METAPLASIA   CERVICAL CONIZATION W/BX N/A 12/13/2018   Procedure: CONIZATION CERVIX WITH BIOPSY;  Surgeon: Lillian Rein, MD;  Location: [facility] OR;  Service: Gynecology;  Laterality: N/A;   CYSTOSCOPY N/A 10/18/2019   Procedure: CYSTOSCOPY;  Surgeon: Lillian Rein, MD;  Location: [facility];  Service: Gynecology;  Laterality: N/A;   IR RADIOLOGY PERIPHERAL GUIDED IV START  01/05/2019   IR US  GUIDE VASC ACCESS RIGHT  01/05/2019   KNEE SURGERY Left 1991   per pt retained hardward   LEEP N/A 12/13/2018   Procedure: possible LOOP ELECTROSURGICAL EXCISION PROCEDURE (LEEP);  Surgeon: Lillian Rein, MD;  Location: [facility] OR;  Service: Gynecology;  Laterality: N/A;   ORIF ANKLE FRACTURE Left 03/14/2015   per pt retained hardware   ORIF WRIST  FRACTURE Left 03/10/2022   Procedure: OPEN REDUCTION INTERNAL FIXATION (ORIF) WRIST FRACTURE;  Surgeon: Arvil Birks, MD;  Location: [facility] OR;  Service: Orthopedics;  Laterality: Left;  regional with iv sedation   SPLENECTOMY, TOTAL  1954   due to spherocytosis   TOTAL LAPAROSCOPIC HYSTERECTOMY WITH SALPINGECTOMY N/A 10/18/2019   Procedure: TOTAL LAPAROSCOPIC HYSTERECTOMY WITH BILATERAL SALPINGECTOMY AND BILATERAL OOPHORECTOMY;  Surgeon: Lillian Rein, MD;  Location: [facility] [place];  Service: Gynecology;  Laterality: N/A;  BILATERAL SALPINGECTOMY POSS OOPHORECTOMY   TUBAL LIGATION Bilateral 1995    Social History:  reports that she has never smoked. She has never used smokeless tobacco. She reports that she does not  currently use alcohol. She reports that she does not use drugs.   Allergies  Allergen Reactions   Penicillins Rash    Did it involve swelling of the face/tongue/throat, SOB, or low BP? No Did it involve sudden or severe rash/hives, skin peeling, or any reaction on the inside of your mouth or nose?  #  #  #  YES  #  #  #  Did you need to seek medical attention at a hospital or doctor's office? #  #  #  YES  #  #  #  When did it last happen?   years ago    If all above answers are NO, may proceed with cephalosporin use.    Sulfa Antibiotics Hives and Other (See Comments)   Nickel Hives   Penicillin G Other (See Comments)    Family History  Problem Relation Age of Onset   Osteoporosis Mother    Colon cancer Mother        deceased   Heart failure Father    Skin cancer Father    Hypertension Father    Heart disease Father    Heart disease Sister    Crohn's disease Daughter    Breast cancer Neg Hx     Family history reviewed and not pertinent ***   Prior to Admission medications   Medication Sig Start Date End Date Taking? Authorizing Provider  acetaminophen  (TYLENOL ) 500 MG tablet Take 1,000-1,300 mg by mouth every 6 (six) hours as needed for moderate pain.    [provider]  ASPIRIN  81 PO Take 81 mg by mouth daily.    [provider]  clonazePAM  (KLONOPIN ) 0.5 MG tablet 1 tab by mouth once daily as needed 04/13/23   Roslyn Coombe, MD  cyclobenzaprine  (FLEXERIL ) 5 MG tablet Take 1-2 tablets (5-10 mg total) by mouth 3 (three) times daily as needed for muscle spasms. 01/02/23   Colene Dauphin, MD  escitalopram  (LEXAPRO ) 10 MG tablet TAKE ONE TABLET BY MOUTH ONCE A DAY 07/31/23   Colene Dauphin, MD  halobetasol  (ULTRAVATE ) 0.05 % ointment Apply 1 Application topically daily as needed (irritation). 11/21/21   [provider]  losartan  (COZAAR ) 25 MG tablet Take 1 tablet (25 mg total) by mouth daily. 05/20/23   Clearnce Curia, NP  methocarbamol  (ROBAXIN )  500 MG tablet Take 1 tablet (500 mg total) by mouth 4 (four) times daily. 12/15/22   Colene Dauphin, MD  NONFORMULARY OR COMPOUNDED ITEM Vitamin E vaginal suppositories 200u/ml.  One pv three times weekly. 12/30/22   Lillian Rein, MD  Omega-3 Fatty Acids (FISH OIL PO) Take 1,300 capsules by mouth daily.    [provider]  promethazine  (PHENERGAN ) 12.5 MG tablet Take 1 tablet (12.5 mg total) by mouth every 4 (four)  hours as needed for nausea or vomiting. 11/25/22   Colene Dauphin, MD  rosuvastatin  (CRESTOR ) 20 MG tablet Take 1 tablet (20 mg total) by mouth daily. 07/14/23   Swinyer, Leilani Punter, NP  triamcinolone  ointment (KENALOG ) 0.5 % Apply 1 Application topically 2 (two) times daily. 02/24/23   Lillian Rein, MD     Objective    Physical Exam: Vitals:   08/09/23 1900 08/09/23 2100 08/09/23 2130 08/09/23 2300  BP: (!) 130/57  130/73 (!) 140/74  Pulse: (!) 58 (!) 58 (!) 59 60  Resp: (!) 22 18 14  (!) 22  Temp:   98 F (36.7 C)   SpO2: 97% 98% 97% 100%  Weight:      Height:        General: appears to be stated age; alert, oriented Skin: warm, dry, no rash Head:  AT/[place] Mouth:  Oral mucosa membranes appear moist, normal dentition Neck: supple; trachea midline Heart:  RRR; did not appreciate any M/R/G Lungs: CTAB, did not appreciate any wheezes, rales, or rhonchi Abdomen: + BS; soft, ND, NT Vascular: 2+ pedal pulses b/l; 2+ radial pulses b/l Extremities: no peripheral edema, no muscle wasting Neuro: strength and sensation intact in upper and lower extremities b/l ***   *** Neuro: 5/5 strength of the proximal and distal flexors and extensors of the upper and lower extremities bilaterally; sensation intact in upper and lower extremities b/l; cranial nerves II through XII grossly intact; no pronator drift; no evidence suggestive of slurred speech, dysarthria, or facial droop; Normal muscle tone. No tremors.  *** Neuro: In the setting of the patient's current mental status  and associated inability to follow instructions, unable to perform full neurologic exam at this time.  As such, assessment of strength, sensation, and cranial nerves is limited at this time. Patient noted to spontaneously move all 4 extremities. No tremors.  ***    Labs on Admission: I have personally reviewed following labs and imaging studies  CBC: Recent Labs  Lab 08/09/23 1747 08/09/23 1801  WBC 16.7*  --   NEUTROABS 14.1*  --   HGB 12.6 11.6*  HCT 36.2 34.0*  MCV 100.0  --   PLT 335  --    Basic Metabolic Panel: Recent Labs  Lab 08/09/23 1747 08/09/23 1801  NA 134* 135  K 3.4* 3.8  CL 98 97*  CO2 23  --   GLUCOSE 309* 300*  BUN 14 16  CREATININE 0.94 0.80  CALCIUM  9.7  --    GFR: Estimated Creatinine Clearance: 53.4 mL/min (by C-G formula based on SCr of 0.8 mg/dL). Liver Function Tests: Recent Labs  Lab 08/09/23 1747  AST 24  ALT 17  ALKPHOS 58  BILITOT 1.6*  PROT 6.9  ALBUMIN 3.5   No results for input(s): LIPASE, AMYLASE in the last 168 hours. No results for input(s): AMMONIA in the last 168 hours. Coagulation Profile: Recent Labs  Lab 08/09/23 1747  INR 1.0   Cardiac Enzymes: No results for input(s): CKTOTAL, CKMB, CKMBINDEX, TROPONINI in the last 168 hours. BNP (last 3 results) No results for input(s): PROBNP in the last 8760 hours. HbA1C: No results for input(s): HGBA1C in the last 72 hours. CBG: Recent Labs  Lab 08/09/23 1851  GLUCAP 205*   Lipid Profile: No results for input(s): CHOL, HDL, LDLCALC, TRIG, CHOLHDL, LDLDIRECT in the last 72 hours. Thyroid  Function Tests: No results for input(s): TSH, T4TOTAL, FREET4, T3FREE, THYROIDAB in the last 72 hours. Anemia Panel: No results for  input(s): VITAMINB12, FOLATE, FERRITIN, TIBC, IRON, RETICCTPCT in the last 72 hours. Urine analysis:    Component Value Date/Time   COLORURINE AMBER (A) 08/09/2023 2100   APPEARANCEUR CLOUDY (A)  08/09/2023 2100   LABSPEC 1.028 08/09/2023 2100   PHURINE 5.0 08/09/2023 2100   GLUCOSEU >=500 (A) 08/09/2023 2100   GLUCOSEU NEGATIVE 09/04/2022 1029   HGBUR SMALL (A) 08/09/2023 2100   BILIRUBINUR NEGATIVE 08/09/2023 2100   BILIRUBINUR large (A) 08/09/2023 1706   BILIRUBINUR n 04/28/2014 1052   KETONESUR NEGATIVE 08/09/2023 2100   KETONESUR small (15) (A) 08/09/2023 1706   PROTEINUR 100 (A) 08/09/2023 2100   PROTEINUR >=300 (A) 08/09/2023 1706   UROBILINOGEN 1.0 08/09/2023 1706   UROBILINOGEN 0.2 09/04/2022 1029   NITRITE NEGATIVE 08/09/2023 2100   NITRITE Negative 08/09/2023 1706   LEUKOCYTESUR LARGE (A) 08/09/2023 2100   LEUKOCYTESUR Trace (A) 08/09/2023 1706    Radiological Exams on Admission: CT ANGIO HEAD NECK W WO CM Result Date: 08/09/2023 CLINICAL DATA:  Initial evaluation for acute diplopia, Horner syndrome. EXAM: CT ANGIOGRAPHY HEAD AND NECK WITH AND WITHOUT CONTRAST TECHNIQUE: Multidetector CT imaging of the head and neck was performed using the standard protocol during bolus administration of intravenous contrast. Multiplanar CT image reconstructions and MIPs were obtained to evaluate the vascular anatomy. Carotid stenosis measurements (when applicable) are obtained utilizing NASCET criteria, using the distal internal carotid diameter as the denominator. RADIATION DOSE REDUCTION: This exam was performed according to the departmental dose-optimization program which includes automated exposure control, adjustment of the mA and/or kV according to patient size and/or use of iterative reconstruction technique. CONTRAST:  75mL OMNIPAQUE  IOHEXOL  350 MG/ML SOLN COMPARISON:  CT from earlier the same day. FINDINGS: CTA NECK FINDINGS Aortic arch: Visualized aortic arch within normal limits for caliber. Origin of the great vessels incompletely visualized on this exam. Mild aortic atherosclerosis. Right carotid system: Right CCA tortuous but patent without stenosis. Atheromatous change about  the right carotid bulb. There is occlusion of the right ICA just distal to the bifurcation (series 7, image 110). Right ICA remains occluded to the terminus. Left carotid system: Left common and internal carotid arteries are tortuous but patent without dissection. Mild atheromatous change about the left carotid bulb without hemodynamically significant stenosis. Vertebral arteries: Left vertebral artery hypoplastic and likely arises from the aortic arch, although the origin is not visualized. Atheromatous change at the origin of the dominant right vertebral artery with mild stenosis. Visualized vertebral arteries patent without stenosis or dissection. Skeleton: No worrisome osseous lesions. Moderate multilevel cervical spondylosis, most pronounced at C5-6 and C6-7. Other neck: No other acute finding. Upper chest: Small area of reticulonodular tree-in-bud densities within the left upper lobe, likely mild infection/small airways disease (series 7, image 138). No other acute finding. Review of the MIP images confirms the above findings CTA HEAD FINDINGS Anterior circulation: For atheromatous change about the left carotid siphon without hemodynamically significant stenosis. Right ICA remains occluded to the terminus. A1 segments patent bilaterally. Left A1 dominant. Normal anterior communicating artery complex. Anterior cerebral arteries patent without stenosis. Left M1 segment and distal left MCA branches are widely patent and well perfused. Revascularization of the right MCA via collateral flow across the circle-of-Willis. Right MCA patent and perfused. Posterior circulation: Dominant right V4 segment patent without stenosis. Right PICA patent. Hypoplastic left vertebral artery largely terminates in PICA, although a tiny branch ascending towards the vertebrobasilar junction. Left PICA patent as well. Basilar patent without stenosis. Superior cerebral arteries patent bilaterally. Left  PCA supplied via a hypoplastic left  P1 segment and prominent left posterior communicating artery. Left PCA patent to its distal aspect without significant stenosis. Fetal type origin of the right PCA with severe stenosis versus occlusion at the origin of the right PCOM (series 8, image 107). Right PCA attenuated but patent distally without visible stenosis. Venous sinuses: Patent allowing for timing the contrast bolus. Anatomic variants: As above.  No aneurysm. Review of the MIP images confirms the above findings IMPRESSION: 1. Occlusion of the right ICA just distal to the bifurcation, age indeterminate, but presumably acute given patient's symptoms. Right ICA remains occluded to the terminus. Revascularization of the right MCA via collateral flow across the circle-of-Willis. 2. Fetal type origin of the right PCA with severe stenosis versus occlusion at the origin of the right PCOM. Right PCA attenuated but patent distally. 3. Mild atheromatous change about the left carotid bulb and left carotid siphon without hemodynamically significant stenosis. 4. Small area of reticulonodular tree-in-bud densities within the left upper lobe, likely mild infection/small airways disease. 5.  Aortic Atherosclerosis (ICD10-I70.0). Critical Value/emergent results were called by telephone at the time of interpretation on 08/09/2023 at 10:40 pm to provider DAN FLOYD , who verbally acknowledged these results. Electronically Signed   By: Virgia Griffins M.D.   On: 08/09/2023 22:43   DG Chest 2 View Result Date: 08/09/2023 EXAM: 2 VIEW(S) XRAY OF THE CHEST 08/09/2023 09:22:00 PM COMPARISON: 02/23/2022 CLINICAL HISTORY: r/o mass. AMS FINDINGS: LUNGS AND PLEURA: Mild lingular and right middle lobe scarring, chronic. Calcified granuloma in the left lower lung. No pulmonary edema. No pleural effusion. No pneumothorax. HEART AND MEDIASTINUM: No acute abnormality of the cardiac and mediastinal silhouettes. Thoracic aortic atherosclerosis. BONES AND SOFT TISSUES: No acute  osseous abnormality. IMPRESSION: 1. No acute findings. Electronically signed by: Zadie Herter MD 08/09/2023 09:28 PM EDT RP Workstation: [id number]   CT HEAD WO CONTRAST Result Date: 08/09/2023 CLINICAL DATA:  Neuro deficit, acute, stroke suspected; Facial trauma, blunt EXAM: CT HEAD WITHOUT CONTRAST CT MAXILLOFACIAL WITHOUT CONTRAST TECHNIQUE: Multidetector CT imaging of the head and maxillofacial structures were performed using the standard protocol without intravenous contrast. Multiplanar CT image reconstructions of the maxillofacial structures were also generated. RADIATION DOSE REDUCTION: This exam was performed according to the departmental dose-optimization program which includes automated exposure control, adjustment of the mA and/or kV according to patient size and/or use of iterative reconstruction technique. COMPARISON:  None Available. FINDINGS: CT HEAD FINDINGS Brain: Atherosclerotic calcifications are present within the cavernous internal carotid arteries. No evidence of large-territorial acute infarction. No parenchymal hemorrhage. No mass lesion. No extra-axial collection. No mass effect or midline shift. No hydrocephalus. Basilar cisterns are patent. Vascular: No hyperdense vessel. Atherosclerotic calcifications are present within the cavernous internal carotid arteries. Skull: No acute fracture or focal lesion. Other: None. CT MAXILLOFACIAL FINDINGS Osseous: No fracture or mandibular dislocation. No destructive process. Sinuses/Orbits: Right maxillary sinus mucosal thickening. Paranasal sinuses and mastoid air cells are clear. The orbits are unremarkable. Soft tissues: Negative. Visualized upper cervical spine: Degenerative changes. IMPRESSION: 1. No acute intracranial abnormality. 2.  No acute displaced facial fracture. Electronically Signed   By: Morgane  Naveau M.D.   On: 08/09/2023 20:29   CT Maxillofacial Wo Contrast Result Date: 08/09/2023 CLINICAL DATA:  Neuro deficit, acute,  stroke suspected; Facial trauma, blunt EXAM: CT HEAD WITHOUT CONTRAST CT MAXILLOFACIAL WITHOUT CONTRAST TECHNIQUE: Multidetector CT imaging of the head and maxillofacial structures were performed using the standard protocol without intravenous contrast. Multiplanar CT  image reconstructions of the maxillofacial structures were also generated. RADIATION DOSE REDUCTION: This exam was performed according to the departmental dose-optimization program which includes automated exposure control, adjustment of the mA and/or kV according to patient size and/or use of iterative reconstruction technique. COMPARISON:  None Available. FINDINGS: CT HEAD FINDINGS Brain: Atherosclerotic calcifications are present within the cavernous internal carotid arteries. No evidence of large-territorial acute infarction. No parenchymal hemorrhage. No mass lesion. No extra-axial collection. No mass effect or midline shift. No hydrocephalus. Basilar cisterns are patent. Vascular: No hyperdense vessel. Atherosclerotic calcifications are present within the cavernous internal carotid arteries. Skull: No acute fracture or focal lesion. Other: None. CT MAXILLOFACIAL FINDINGS Osseous: No fracture or mandibular dislocation. No destructive process. Sinuses/Orbits: Right maxillary sinus mucosal thickening. Paranasal sinuses and mastoid air cells are clear. The orbits are unremarkable. Soft tissues: Negative. Visualized upper cervical spine: Degenerative changes. IMPRESSION: 1. No acute intracranial abnormality. 2.  No acute displaced facial fracture. Electronically Signed   By: Morgane  Naveau M.D.   On: 08/09/2023 20:29      Assessment/Plan   Principal Problem:   Acute  encephalopathy   ***            ***                  ***                   ***                  ***                  ***                  ***                   ***                  ***                  ***                  ***                  ***                 ***                ***  DVT prophylaxis: SCD's ***  Code Status: Full code*** Family Communication: none*** Disposition Plan: Per Rounding Team Consults called: none***;  Admission status: ***     I SPENT GREATER THAN 75 *** MINUTES IN CLINICAL CARE TIME/MEDICAL DECISION-MAKING IN COMPLETING THIS ADMISSION.      Gattis Kass Kathia Covington DO [facility]  From 7PM - 7AM   08/09/2023, 11:47 PM   ***

## 2023-08-09 NOTE — ED Triage Notes (Signed)
 Daughter states that pt's blood pressure has been running high. Pt has also had multiple falls over the past 48 hours.  Daughter states that pt is having memory issues as well. Daughter states that pt has had symptoms for 1 week.

## 2023-08-09 NOTE — ED Triage Notes (Signed)
 Pt brought in by daughter who was sent by Urgent Care today for stroke like symptoms. The daughter reports over the last week the patient has had memory issues, multiple falls in the last 48 hours, and trouble opening her right eye yesterday. The patient reports her right leg feels more weak than usual as well. Daughter also reports she has nasal congestion and is worried she is dehydrated as well. The patient did not know her age, which her daughter states is not normal for her.

## 2023-08-09 NOTE — ED Notes (Signed)
 Patient reports upper left molar pain. Patient concerned it may be causing current eye symptoms.

## 2023-08-10 ENCOUNTER — Encounter (HOSPITAL_COMMUNITY): Payer: Self-pay | Admitting: Internal Medicine

## 2023-08-10 ENCOUNTER — Inpatient Hospital Stay (HOSPITAL_COMMUNITY)

## 2023-08-10 ENCOUNTER — Other Ambulatory Visit: Payer: Self-pay

## 2023-08-10 ENCOUNTER — Observation Stay (HOSPITAL_COMMUNITY)

## 2023-08-10 DIAGNOSIS — H02401 Unspecified ptosis of right eyelid: Secondary | ICD-10-CM | POA: Diagnosis not present

## 2023-08-10 DIAGNOSIS — I471 Supraventricular tachycardia, unspecified: Secondary | ICD-10-CM | POA: Diagnosis not present

## 2023-08-10 DIAGNOSIS — N3000 Acute cystitis without hematuria: Secondary | ICD-10-CM | POA: Diagnosis present

## 2023-08-10 DIAGNOSIS — Z8262 Family history of osteoporosis: Secondary | ICD-10-CM | POA: Diagnosis not present

## 2023-08-10 DIAGNOSIS — M810 Age-related osteoporosis without current pathological fracture: Secondary | ICD-10-CM | POA: Diagnosis not present

## 2023-08-10 DIAGNOSIS — K0889 Other specified disorders of teeth and supporting structures: Secondary | ICD-10-CM | POA: Diagnosis not present

## 2023-08-10 DIAGNOSIS — E782 Mixed hyperlipidemia: Secondary | ICD-10-CM | POA: Diagnosis not present

## 2023-08-10 DIAGNOSIS — Y92003 Bedroom of unspecified non-institutional (private) residence as the place of occurrence of the external cause: Secondary | ICD-10-CM | POA: Diagnosis not present

## 2023-08-10 DIAGNOSIS — Z9081 Acquired absence of spleen: Secondary | ICD-10-CM | POA: Diagnosis not present

## 2023-08-10 DIAGNOSIS — I6389 Other cerebral infarction: Secondary | ICD-10-CM | POA: Diagnosis not present

## 2023-08-10 DIAGNOSIS — I6521 Occlusion and stenosis of right carotid artery: Secondary | ICD-10-CM | POA: Diagnosis not present

## 2023-08-10 DIAGNOSIS — Z79899 Other long term (current) drug therapy: Secondary | ICD-10-CM | POA: Diagnosis not present

## 2023-08-10 DIAGNOSIS — I6523 Occlusion and stenosis of bilateral carotid arteries: Secondary | ICD-10-CM | POA: Diagnosis not present

## 2023-08-10 DIAGNOSIS — I69398 Other sequelae of cerebral infarction: Secondary | ICD-10-CM | POA: Diagnosis not present

## 2023-08-10 DIAGNOSIS — R29700 NIHSS score 0: Secondary | ICD-10-CM | POA: Diagnosis not present

## 2023-08-10 DIAGNOSIS — I63411 Cerebral infarction due to embolism of right middle cerebral artery: Secondary | ICD-10-CM

## 2023-08-10 DIAGNOSIS — N390 Urinary tract infection, site not specified: Secondary | ICD-10-CM | POA: Diagnosis not present

## 2023-08-10 DIAGNOSIS — I700 Atherosclerosis of aorta: Secondary | ICD-10-CM | POA: Diagnosis not present

## 2023-08-10 DIAGNOSIS — I69390 Apraxia following cerebral infarction: Secondary | ICD-10-CM | POA: Diagnosis not present

## 2023-08-10 DIAGNOSIS — G902 Horner's syndrome: Secondary | ICD-10-CM | POA: Diagnosis present

## 2023-08-10 DIAGNOSIS — I2510 Atherosclerotic heart disease of native coronary artery without angina pectoris: Secondary | ICD-10-CM | POA: Diagnosis not present

## 2023-08-10 DIAGNOSIS — G9389 Other specified disorders of brain: Secondary | ICD-10-CM | POA: Diagnosis not present

## 2023-08-10 DIAGNOSIS — Z880 Allergy status to penicillin: Secondary | ICD-10-CM | POA: Diagnosis not present

## 2023-08-10 DIAGNOSIS — R000 Tachycardia, unspecified: Secondary | ICD-10-CM | POA: Diagnosis not present

## 2023-08-10 DIAGNOSIS — I69354 Hemiplegia and hemiparesis following cerebral infarction affecting left non-dominant side: Secondary | ICD-10-CM | POA: Diagnosis not present

## 2023-08-10 DIAGNOSIS — Z87410 Personal history of cervical dysplasia: Secondary | ICD-10-CM | POA: Diagnosis not present

## 2023-08-10 DIAGNOSIS — G9340 Encephalopathy, unspecified: Secondary | ICD-10-CM | POA: Diagnosis not present

## 2023-08-10 DIAGNOSIS — Z800 Family history of malignant neoplasm of digestive organs: Secondary | ICD-10-CM | POA: Diagnosis not present

## 2023-08-10 DIAGNOSIS — R296 Repeated falls: Secondary | ICD-10-CM | POA: Diagnosis not present

## 2023-08-10 DIAGNOSIS — R739 Hyperglycemia, unspecified: Secondary | ICD-10-CM | POA: Diagnosis not present

## 2023-08-10 DIAGNOSIS — Z7902 Long term (current) use of antithrombotics/antiplatelets: Secondary | ICD-10-CM | POA: Diagnosis not present

## 2023-08-10 DIAGNOSIS — I639 Cerebral infarction, unspecified: Secondary | ICD-10-CM | POA: Diagnosis not present

## 2023-08-10 DIAGNOSIS — I63521 Cerebral infarction due to unspecified occlusion or stenosis of right anterior cerebral artery: Secondary | ICD-10-CM | POA: Diagnosis not present

## 2023-08-10 DIAGNOSIS — I63431 Cerebral infarction due to embolism of right posterior cerebral artery: Secondary | ICD-10-CM

## 2023-08-10 DIAGNOSIS — F32A Depression, unspecified: Secondary | ICD-10-CM | POA: Diagnosis not present

## 2023-08-10 DIAGNOSIS — F419 Anxiety disorder, unspecified: Secondary | ICD-10-CM | POA: Diagnosis not present

## 2023-08-10 DIAGNOSIS — Z8249 Family history of ischemic heart disease and other diseases of the circulatory system: Secondary | ICD-10-CM | POA: Diagnosis not present

## 2023-08-10 DIAGNOSIS — E785 Hyperlipidemia, unspecified: Secondary | ICD-10-CM | POA: Diagnosis not present

## 2023-08-10 DIAGNOSIS — G9341 Metabolic encephalopathy: Secondary | ICD-10-CM | POA: Diagnosis not present

## 2023-08-10 DIAGNOSIS — I63531 Cerebral infarction due to unspecified occlusion or stenosis of right posterior cerebral artery: Secondary | ICD-10-CM | POA: Diagnosis not present

## 2023-08-10 DIAGNOSIS — I69319 Unspecified symptoms and signs involving cognitive functions following cerebral infarction: Secondary | ICD-10-CM | POA: Diagnosis not present

## 2023-08-10 DIAGNOSIS — I63511 Cerebral infarction due to unspecified occlusion or stenosis of right middle cerebral artery: Secondary | ICD-10-CM | POA: Diagnosis not present

## 2023-08-10 DIAGNOSIS — R29810 Facial weakness: Secondary | ICD-10-CM | POA: Diagnosis not present

## 2023-08-10 DIAGNOSIS — Z85828 Personal history of other malignant neoplasm of skin: Secondary | ICD-10-CM | POA: Diagnosis not present

## 2023-08-10 DIAGNOSIS — Z7982 Long term (current) use of aspirin: Secondary | ICD-10-CM | POA: Diagnosis not present

## 2023-08-10 DIAGNOSIS — R6520 Severe sepsis without septic shock: Secondary | ICD-10-CM | POA: Diagnosis not present

## 2023-08-10 DIAGNOSIS — E876 Hypokalemia: Secondary | ICD-10-CM | POA: Diagnosis not present

## 2023-08-10 DIAGNOSIS — I4719 Other supraventricular tachycardia: Secondary | ICD-10-CM | POA: Diagnosis not present

## 2023-08-10 DIAGNOSIS — G319 Degenerative disease of nervous system, unspecified: Secondary | ICD-10-CM | POA: Diagnosis not present

## 2023-08-10 DIAGNOSIS — A419 Sepsis, unspecified organism: Secondary | ICD-10-CM | POA: Diagnosis present

## 2023-08-10 DIAGNOSIS — Z8679 Personal history of other diseases of the circulatory system: Secondary | ICD-10-CM

## 2023-08-10 DIAGNOSIS — R519 Headache, unspecified: Secondary | ICD-10-CM | POA: Diagnosis not present

## 2023-08-10 DIAGNOSIS — I10 Essential (primary) hypertension: Secondary | ICD-10-CM | POA: Diagnosis not present

## 2023-08-10 DIAGNOSIS — Z90710 Acquired absence of both cervix and uterus: Secondary | ICD-10-CM | POA: Diagnosis not present

## 2023-08-10 DIAGNOSIS — W06XXXA Fall from bed, initial encounter: Secondary | ICD-10-CM | POA: Diagnosis not present

## 2023-08-10 DIAGNOSIS — A499 Bacterial infection, unspecified: Secondary | ICD-10-CM | POA: Diagnosis not present

## 2023-08-10 DIAGNOSIS — R42 Dizziness and giddiness: Secondary | ICD-10-CM | POA: Diagnosis not present

## 2023-08-10 DIAGNOSIS — E871 Hypo-osmolality and hyponatremia: Secondary | ICD-10-CM | POA: Diagnosis not present

## 2023-08-10 DIAGNOSIS — Z808 Family history of malignant neoplasm of other organs or systems: Secondary | ICD-10-CM | POA: Diagnosis not present

## 2023-08-10 DIAGNOSIS — R4182 Altered mental status, unspecified: Secondary | ICD-10-CM | POA: Diagnosis not present

## 2023-08-10 DIAGNOSIS — Z882 Allergy status to sulfonamides status: Secondary | ICD-10-CM | POA: Diagnosis not present

## 2023-08-10 LAB — COMPREHENSIVE METABOLIC PANEL WITH GFR
ALT: 16 U/L (ref 0–44)
AST: 18 U/L (ref 15–41)
Albumin: 3 g/dL — ABNORMAL LOW (ref 3.5–5.0)
Alkaline Phosphatase: 45 U/L (ref 38–126)
Anion gap: 9 (ref 5–15)
BUN: 10 mg/dL (ref 8–23)
CO2: 26 mmol/L (ref 22–32)
Calcium: 9 mg/dL (ref 8.9–10.3)
Chloride: 102 mmol/L (ref 98–111)
Creatinine, Ser: 0.63 mg/dL (ref 0.44–1.00)
GFR, Estimated: >60 mL/min (ref >60)
Glucose, Bld: 105 mg/dL — ABNORMAL HIGH (ref 70–99)
Potassium: 4.4 mmol/L (ref 3.5–5.1)
Sodium: 137 mmol/L (ref 135–145)
Total Bilirubin: 1.6 mg/dL — ABNORMAL HIGH (ref 0.0–1.2)
Total Protein: 6 g/dL — ABNORMAL LOW (ref 6.5–8.1)

## 2023-08-10 LAB — CBG MONITORING, ED
Glucose-Capillary: 108 mg/dL — ABNORMAL HIGH (ref 70–99)
Glucose-Capillary: 96 mg/dL (ref 70–99)

## 2023-08-10 LAB — HEMOGLOBIN A1C
Hgb A1c MFr Bld: 3.7 % — ABNORMAL LOW (ref 4.8–5.6)
Mean Plasma Glucose: 59.49 mg/dL

## 2023-08-10 LAB — CBC WITH DIFFERENTIAL/PLATELET
Abs Immature Granulocytes: 0.07 10*3/uL (ref 0.00–0.07)
Basophils Absolute: 0.1 10*3/uL (ref 0.0–0.1)
Basophils Relative: 1 %
Eosinophils Absolute: 0 10*3/uL (ref 0.0–0.5)
Eosinophils Relative: 0 %
HCT: 33.5 % — ABNORMAL LOW (ref 36.0–46.0)
Hemoglobin: 12 g/dL (ref 12.0–15.0)
Immature Granulocytes: 1 %
Lymphocytes Relative: 12 %
Lymphs Abs: 1.5 10*3/uL (ref 0.7–4.0)
MCH: 34.8 pg — ABNORMAL HIGH (ref 26.0–34.0)
MCHC: 35.8 g/dL (ref 30.0–36.0)
MCV: 97.1 fL (ref 80.0–100.0)
Monocytes Absolute: 1.8 10*3/uL — ABNORMAL HIGH (ref 0.1–1.0)
Monocytes Relative: 14 %
Neutro Abs: 9.3 10*3/uL — ABNORMAL HIGH (ref 1.7–7.7)
Neutrophils Relative %: 72 %
Platelets: 300 10*3/uL (ref 150–400)
RBC: 3.45 MIL/uL — ABNORMAL LOW (ref 3.87–5.11)
RDW: 15.9 % — ABNORMAL HIGH (ref 11.5–15.5)
WBC: 12.8 10*3/uL — ABNORMAL HIGH (ref 4.0–10.5)
nRBC: 0 % (ref 0.0–0.2)

## 2023-08-10 LAB — BILIRUBIN, FRACTIONATED(TOT/DIR/INDIR)
Bilirubin, Direct: 0.3 mg/dL — ABNORMAL HIGH (ref 0.0–0.2)
Indirect Bilirubin: 1.2 mg/dL — ABNORMAL HIGH (ref 0.3–0.9)
Total Bilirubin: 1.5 mg/dL — ABNORMAL HIGH (ref 0.0–1.2)

## 2023-08-10 LAB — I-STAT CG4 LACTIC ACID, ED: Lactic Acid, Venous: 0.9 mmol/L (ref 0.5–1.9)

## 2023-08-10 LAB — TSH: TSH: 1.594 u[IU]/mL (ref 0.350–4.500)

## 2023-08-10 LAB — MAGNESIUM: Magnesium: 1.9 mg/dL (ref 1.7–2.4)

## 2023-08-10 LAB — VITAMIN B12: Vitamin B-12: 271 pg/mL (ref 180–914)

## 2023-08-10 MED ORDER — CLOPIDOGREL BISULFATE 75 MG PO TABS
75.0000 mg | ORAL_TABLET | Freq: Every day | ORAL | Status: DC
  Administered 2023-08-11 – 2023-08-12 (×2): 75 mg via ORAL
  Filled 2023-08-10 (×3): qty 1

## 2023-08-10 MED ORDER — STROKE: EARLY STAGES OF RECOVERY BOOK
Freq: Once | Status: AC
  Filled 2023-08-10: qty 1

## 2023-08-10 MED ORDER — ASPIRIN 81 MG PO CHEW: 81.0000 mg | CHEWABLE_TABLET | Freq: Every day | ORAL | Status: DC

## 2023-08-10 MED ORDER — INSULIN ASPART 100 UNIT/ML IJ SOLN: 0.0000 [IU] | Freq: Three times a day (TID) | INTRAMUSCULAR | Status: DC

## 2023-08-10 MED ORDER — POTASSIUM CHLORIDE CRYS ER 20 MEQ PO TBCR
40.0000 meq | EXTENDED_RELEASE_TABLET | Freq: Once | ORAL | Status: AC
  Administered 2023-08-10: 40 meq via ORAL
  Filled 2023-08-10: qty 2

## 2023-08-10 MED ORDER — ESCITALOPRAM OXALATE 10 MG PO TABS
10.0000 mg | ORAL_TABLET | Freq: Every day | ORAL | Status: DC
  Administered 2023-08-11 – 2023-08-12 (×2): 10 mg via ORAL
  Filled 2023-08-10 (×3): qty 1

## 2023-08-10 MED ORDER — CLOPIDOGREL BISULFATE 300 MG PO TABS
300.0000 mg | ORAL_TABLET | Freq: Once | ORAL | Status: AC
  Filled 2023-08-10: qty 1

## 2023-08-10 MED ORDER — ASPIRIN 325 MG PO TABS
325.0000 mg | ORAL_TABLET | Freq: Once | ORAL | Status: AC
  Filled 2023-08-10: qty 1

## 2023-08-10 MED ORDER — ROSUVASTATIN CALCIUM 20 MG PO TABS
20.0000 mg | ORAL_TABLET | Freq: Every day | ORAL | Status: DC
  Administered 2023-08-11 – 2023-08-12 (×2): 20 mg via ORAL
  Filled 2023-08-10 (×3): qty 1

## 2023-08-10 MED ORDER — ENOXAPARIN SODIUM 40 MG/0.4ML IJ SOSY
40.0000 mg | PREFILLED_SYRINGE | INTRAMUSCULAR | Status: DC
  Administered 2023-08-11 – 2023-08-12 (×2): 40 mg via SUBCUTANEOUS
  Filled 2023-08-10 (×3): qty 0.4

## 2023-08-10 MED ORDER — ASPIRIN 81 MG PO TBEC
81.0000 mg | DELAYED_RELEASE_TABLET | Freq: Every day | ORAL | Status: DC
  Administered 2023-08-11 – 2023-08-12 (×2): 81 mg via ORAL
  Filled 2023-08-10 (×3): qty 1

## 2023-08-10 NOTE — ED Notes (Signed)
 Neuro at bedside.

## 2023-08-10 NOTE — ED Notes (Signed)
 Daughter bedside.PT currently eating soup

## 2023-08-10 NOTE — ED Notes (Addendum)
 Alicia Wilkerson

## 2023-08-10 NOTE — ED Notes (Signed)
 Daughter contacted and informed about her mothers room location and CT scan.

## 2023-08-10 NOTE — Progress Notes (Signed)
 PROGRESS NOTE    Alicia Wilkerson  NWG:[id number] DOB: Nov 02, 1950 DOA: 08/09/2023 PCP: Colene Dauphin, MD     Brief Narrative:   From admission h and p  Alicia Wilkerson is a 73 y.o. female with medical history significant for essential pretension, hyperlipidemia, who is admitted to [facility] on 08/09/2023 with acute encephalopathy after presenting from home to [facility] ED complaining of altered mental status.    In setting of the patient's altered mental status, history is provided by patient, as well as her daughter, in addition to my discussions with the EDP ED chart review.   Daughter conveys that the patient has been altered, confused over the last 1 to 2 days.  Daughter, who lives in the state of ER, first noted the patient to be confused on Saturday, 08/08/2023, with last known well occurring on Friday, 08/07/2023.  On Saturday, 08/08/2023, daughter also felt, via FaceTime evaluation, the patient was exhibiting some evidence of facial droop.   The patient reportedly experienced a ground-level fall on Saturday, 08/08/2023, in which she was leaning forward to pick something up, and tripped, resulting in her hitting her head on the floor, without any associated loss of consciousness.  She is on daily baby aspirin  as an outpatient in the absence of any additional blood thinners at home.  Additionally, the patient reportedly fell out of her bed later on Saturday, 08/08/2023, which was also associated with no loss of consciousness.   No known history of diabetes .   Given ongoing confusion exhibited by the patient, daughter arrived from [place]  earlier today, and initially brought the patient to a local urgent care, before receiving recommendation to present to the ED for further evaluation management of the above.  Assessment & Plan:   Principal Problem:   Acute encephalopathy Active Problems:   Hyperglycemia   HLD (hyperlipidemia)   CAD (coronary artery disease)   Severe sepsis  (HCC)   Acute cystitis   Hypokalemia   Horner syndrome   History of essential hypertension  # Acute CVA Symptoms appear to have begun 6/14, mainly confusion. MRI with acute ischemic right PCA territory infarcts as well as punctate infarcts right frontal lobe. CTA shows right ICA occlusion. - aspirin , further antiplatelet per neurology - TTE  - tele - lipid panel, continue home rosuvastatin  20 for now - pt/ot consults  # Acute cystitis? Ua suggestive of infection, leukocytosis on arrival (can be seen with stroke). Patient denies symptoms but there is some confusion - f/u urine culture - continue ceftriaxone for now  # Elevated bilirubin Appears chronic - f/u fractionated bili  # HTN Bp wnl - holding home losartan  for permissive htn  # GAD - home lexapro   DVT prophylaxis: lovenox  Code Status: full Family Communication: daughter Bridgette Campus updated telephonically 6/16  Level of care: Telemetry Medical Status is: Observation    Consultants:  neurology  Procedures: none  Antimicrobials:  none    Subjective: Reports feeling fine  Objective: Vitals:   08/10/23 0038 08/10/23 0144 08/10/23 0910 08/10/23 1214  BP:   131/69   Pulse: (!) 55  (!) 55   Resp: 17  12 13   Temp:  98.5 F (36.9 C) 98.4 F (36.9 C)   TempSrc:  Oral Oral   SpO2: 95%  100%   Weight:      Height:        Intake/Output Summary (Last 24 hours) at 08/10/2023 1220 Last data filed at 08/09/2023 2312 Gross per 24 hour  Intake 100.93 ml  Output --  Net 100.93 ml   Filed Weights   08/09/23 1745  Weight: 55.3 kg    Examination:  General exam: Appears calm and comfortable  Respiratory system: Clear to auscultation. Respiratory effort normal. Cardiovascular system: S1 & S2 heard, RRR.   Gastrointestinal system: Abdomen is nondistended, soft and nontender.   Central nervous system: Alert and oriented.  5/5 upper and lower strength. Mild ptosis right eye Extremities: Symmetric 5 x 5  power. Skin: No rashes, lesions or ulcers Psychiatry: calm    Data Reviewed: I have personally reviewed following labs and imaging studies  CBC: Recent Labs  Lab 08/09/23 1747 08/09/23 1801 08/10/23 0542  WBC 16.7*  --  12.8*  NEUTROABS 14.1*  --  9.3*  HGB 12.6 11.6* 12.0  HCT 36.2 34.0* 33.5*  MCV 100.0  --  97.1  PLT 335  --  300   Basic Metabolic Panel: Recent Labs  Lab 08/09/23 1747 08/09/23 1801 08/10/23 0542  NA 134* 135 137  K 3.4* 3.8 4.4  CL 98 97* 102  CO2 23  --  26  GLUCOSE 309* 300* 105*  BUN 14 16 10   CREATININE 0.94 0.80 0.63  CALCIUM  9.7  --  9.0  MG  --   --  1.9   GFR: Estimated Creatinine Clearance: 53.4 mL/min (by C-G formula based on SCr of 0.63 mg/dL). Liver Function Tests: Recent Labs  Lab 08/09/23 1747 08/10/23 0542  AST 24 18  ALT 17 16  ALKPHOS 58 45  BILITOT 1.6* 1.6*  PROT 6.9 6.0*  ALBUMIN 3.5 3.0*   No results for input(s): LIPASE, AMYLASE in the last 168 hours. No results for input(s): AMMONIA in the last 168 hours. Coagulation Profile: Recent Labs  Lab 08/09/23 1747  INR 1.0   Cardiac Enzymes: No results for input(s): CKTOTAL, CKMB, CKMBINDEX, TROPONINI in the last 168 hours. BNP (last 3 results) No results for input(s): PROBNP in the last 8760 hours. HbA1C: Recent Labs    08/10/23 1004  HGBA1C 3.7*   CBG: Recent Labs  Lab 08/09/23 1851 08/10/23 0937 08/10/23 1204  GLUCAP 205* 108* 96   Lipid Profile: No results for input(s): CHOL, HDL, LDLCALC, TRIG, CHOLHDL, LDLDIRECT in the last 72 hours. Thyroid  Function Tests: Recent Labs    08/10/23 1004  TSH 1.594   Anemia Panel: No results for input(s): VITAMINB12, FOLATE, FERRITIN, TIBC, IRON, RETICCTPCT in the last 72 hours. Urine analysis:    Component Value Date/Time   COLORURINE AMBER (A) 08/09/2023 2100   APPEARANCEUR CLOUDY (A) 08/09/2023 2100   LABSPEC 1.028 08/09/2023 2100   PHURINE 5.0 08/09/2023 2100    GLUCOSEU >=500 (A) 08/09/2023 2100   GLUCOSEU NEGATIVE 09/04/2022 1029   HGBUR SMALL (A) 08/09/2023 2100   BILIRUBINUR NEGATIVE 08/09/2023 2100   BILIRUBINUR large (A) 08/09/2023 1706   BILIRUBINUR n 04/28/2014 1052   KETONESUR NEGATIVE 08/09/2023 2100   KETONESUR small (15) (A) 08/09/2023 1706   PROTEINUR 100 (A) 08/09/2023 2100   PROTEINUR >=300 (A) 08/09/2023 1706   UROBILINOGEN 1.0 08/09/2023 1706   UROBILINOGEN 0.2 09/04/2022 1029   NITRITE NEGATIVE 08/09/2023 2100   NITRITE Negative 08/09/2023 1706   LEUKOCYTESUR LARGE (A) 08/09/2023 2100   LEUKOCYTESUR Trace (A) 08/09/2023 1706   Sepsis Labs: @LABRCNTIP (procalcitonin:4,lacticidven:4)  )No results found for this or any previous visit (from the past 240 hours).       Radiology Studies: MR BRAIN WO CONTRAST Result Date: 08/10/2023 CLINICAL DATA:  Initial  evaluation for acute neuro deficit, stroke suspected. EXAM: MRI HEAD WITHOUT CONTRAST TECHNIQUE: Multiplanar, multiecho pulse sequences of the brain and surrounding structures were obtained without intravenous contrast. COMPARISON:  CTs from 08/09/2023 FINDINGS: Brain: Mildly advanced cerebral atrophy. No significant cerebral white matter disease for age. 1.2 cm acute ischemic nonhemorrhagic infarct present at the ventral right thalamic capsular region (series 5, image 75). Patchy small volume acute ischemic nonhemorrhagic infarcts at the right occipital lobe, right PCA distribution (series 5, images 68, 70, 73). Mild patchy involvement of the mesial right temporal lobe/right hippocampus (series 5, images 72, 69). Few additional punctate acute ischemic nonhemorrhagic infarcts about the right frontal lobe (series 5, images 83, 80). No other evidence for acute or subacute infarct. Gray-white matter differentiation otherwise maintained. No acute or chronic intracranial blood products. No mass lesion, midline shift or mass effect. No hydrocephalus or extra-axial fluid collection.  Prominent dural calcifications noted along the anterior falx. Pituitary gland within normal limits. Vascular: Major intracranial vascular flow voids are maintained. Skull and upper cervical spine: Craniocervical junction within normal limits. Bone marrow signal intensity normal. No scalp soft tissue abnormality. Sinuses/Orbits: Globes orbital soft tissues within normal limits. Small right maxillary sinus retention cyst noted. Small volume pneumatized secretions noted within the left sphenoid sinus. No significant mastoid effusion. Other: None. IMPRESSION: 1. Patchy small volume acute ischemic nonhemorrhagic right PCA territory infarcts as above, with a few additional punctate infarcts within the right frontal lobe. 2. Underlying mildly advanced cerebral atrophy. Electronically Signed   By: Virgia Griffins M.D.   On: 08/10/2023 02:39   CT ANGIO HEAD NECK W WO CM Result Date: 08/09/2023 CLINICAL DATA:  Initial evaluation for acute diplopia, Horner syndrome. EXAM: CT ANGIOGRAPHY HEAD AND NECK WITH AND WITHOUT CONTRAST TECHNIQUE: Multidetector CT imaging of the head and neck was performed using the standard protocol during bolus administration of intravenous contrast. Multiplanar CT image reconstructions and MIPs were obtained to evaluate the vascular anatomy. Carotid stenosis measurements (when applicable) are obtained utilizing NASCET criteria, using the distal internal carotid diameter as the denominator. RADIATION DOSE REDUCTION: This exam was performed according to the departmental dose-optimization program which includes automated exposure control, adjustment of the mA and/or kV according to patient size and/or use of iterative reconstruction technique. CONTRAST:  75mL OMNIPAQUE  IOHEXOL  350 MG/ML SOLN COMPARISON:  CT from earlier the same day. FINDINGS: CTA NECK FINDINGS Aortic arch: Visualized aortic arch within normal limits for caliber. Origin of the great vessels incompletely visualized on this exam.  Mild aortic atherosclerosis. Right carotid system: Right CCA tortuous but patent without stenosis. Atheromatous change about the right carotid bulb. There is occlusion of the right ICA just distal to the bifurcation (series 7, image 110). Right ICA remains occluded to the terminus. Left carotid system: Left common and internal carotid arteries are tortuous but patent without dissection. Mild atheromatous change about the left carotid bulb without hemodynamically significant stenosis. Vertebral arteries: Left vertebral artery hypoplastic and likely arises from the aortic arch, although the origin is not visualized. Atheromatous change at the origin of the dominant right vertebral artery with mild stenosis. Visualized vertebral arteries patent without stenosis or dissection. Skeleton: No worrisome osseous lesions. Moderate multilevel cervical spondylosis, most pronounced at C5-6 and C6-7. Other neck: No other acute finding. Upper chest: Small area of reticulonodular tree-in-bud densities within the left upper lobe, likely mild infection/small airways disease (series 7, image 138). No other acute finding. Review of the MIP images confirms the above findings CTA HEAD FINDINGS  Anterior circulation: For atheromatous change about the left carotid siphon without hemodynamically significant stenosis. Right ICA remains occluded to the terminus. A1 segments patent bilaterally. Left A1 dominant. Normal anterior communicating artery complex. Anterior cerebral arteries patent without stenosis. Left M1 segment and distal left MCA branches are widely patent and well perfused. Revascularization of the right MCA via collateral flow across the circle-of-Willis. Right MCA patent and perfused. Posterior circulation: Dominant right V4 segment patent without stenosis. Right PICA patent. Hypoplastic left vertebral artery largely terminates in PICA, although a tiny branch ascending towards the vertebrobasilar junction. Left PICA patent as  well. Basilar patent without stenosis. Superior cerebral arteries patent bilaterally. Left PCA supplied via a hypoplastic left P1 segment and prominent left posterior communicating artery. Left PCA patent to its distal aspect without significant stenosis. Fetal type origin of the right PCA with severe stenosis versus occlusion at the origin of the right PCOM (series 8, image 107). Right PCA attenuated but patent distally without visible stenosis. Venous sinuses: Patent allowing for timing the contrast bolus. Anatomic variants: As above.  No aneurysm. Review of the MIP images confirms the above findings IMPRESSION: 1. Occlusion of the right ICA just distal to the bifurcation, age indeterminate, but presumably acute given patient's symptoms. Right ICA remains occluded to the terminus. Revascularization of the right MCA via collateral flow across the circle-of-Willis. 2. Fetal type origin of the right PCA with severe stenosis versus occlusion at the origin of the right PCOM. Right PCA attenuated but patent distally. 3. Mild atheromatous change about the left carotid bulb and left carotid siphon without hemodynamically significant stenosis. 4. Small area of reticulonodular tree-in-bud densities within the left upper lobe, likely mild infection/small airways disease. 5.  Aortic Atherosclerosis (ICD10-I70.0). Critical Value/emergent results were called by telephone at the time of interpretation on 08/09/2023 at 10:40 pm to provider DAN FLOYD , who verbally acknowledged these results. Electronically Signed   By: Virgia Griffins M.D.   On: 08/09/2023 22:43   DG Chest 2 View Result Date: 08/09/2023 EXAM: 2 VIEW(S) XRAY OF THE CHEST 08/09/2023 09:22:00 PM COMPARISON: 02/23/2022 CLINICAL HISTORY: r/o mass. AMS FINDINGS: LUNGS AND PLEURA: Mild lingular and right middle lobe scarring, chronic. Calcified granuloma in the left lower lung. No pulmonary edema. No pleural effusion. No pneumothorax. HEART AND MEDIASTINUM: No  acute abnormality of the cardiac and mediastinal silhouettes. Thoracic aortic atherosclerosis. BONES AND SOFT TISSUES: No acute osseous abnormality. IMPRESSION: 1. No acute findings. Electronically signed by: Zadie Herter MD 08/09/2023 09:28 PM EDT RP Workstation: [id number]   CT HEAD WO CONTRAST Result Date: 08/09/2023 CLINICAL DATA:  Neuro deficit, acute, stroke suspected; Facial trauma, blunt EXAM: CT HEAD WITHOUT CONTRAST CT MAXILLOFACIAL WITHOUT CONTRAST TECHNIQUE: Multidetector CT imaging of the head and maxillofacial structures were performed using the standard protocol without intravenous contrast. Multiplanar CT image reconstructions of the maxillofacial structures were also generated. RADIATION DOSE REDUCTION: This exam was performed according to the departmental dose-optimization program which includes automated exposure control, adjustment of the mA and/or kV according to patient size and/or use of iterative reconstruction technique. COMPARISON:  None Available. FINDINGS: CT HEAD FINDINGS Brain: Atherosclerotic calcifications are present within the cavernous internal carotid arteries. No evidence of large-territorial acute infarction. No parenchymal hemorrhage. No mass lesion. No extra-axial collection. No mass effect or midline shift. No hydrocephalus. Basilar cisterns are patent. Vascular: No hyperdense vessel. Atherosclerotic calcifications are present within the cavernous internal carotid arteries. Skull: No acute fracture or focal lesion. Other: None. CT MAXILLOFACIAL FINDINGS Osseous:  No fracture or mandibular dislocation. No destructive process. Sinuses/Orbits: Right maxillary sinus mucosal thickening. Paranasal sinuses and mastoid air cells are clear. The orbits are unremarkable. Soft tissues: Negative. Visualized upper cervical spine: Degenerative changes. IMPRESSION: 1. No acute intracranial abnormality. 2.  No acute displaced facial fracture. Electronically Signed   By: Morgane  Naveau  M.D.   On: 08/09/2023 20:29   CT Maxillofacial Wo Contrast Result Date: 08/09/2023 CLINICAL DATA:  Neuro deficit, acute, stroke suspected; Facial trauma, blunt EXAM: CT HEAD WITHOUT CONTRAST CT MAXILLOFACIAL WITHOUT CONTRAST TECHNIQUE: Multidetector CT imaging of the head and maxillofacial structures were performed using the standard protocol without intravenous contrast. Multiplanar CT image reconstructions of the maxillofacial structures were also generated. RADIATION DOSE REDUCTION: This exam was performed according to the departmental dose-optimization program which includes automated exposure control, adjustment of the mA and/or kV according to patient size and/or use of iterative reconstruction technique. COMPARISON:  None Available. FINDINGS: CT HEAD FINDINGS Brain: Atherosclerotic calcifications are present within the cavernous internal carotid arteries. No evidence of large-territorial acute infarction. No parenchymal hemorrhage. No mass lesion. No extra-axial collection. No mass effect or midline shift. No hydrocephalus. Basilar cisterns are patent. Vascular: No hyperdense vessel. Atherosclerotic calcifications are present within the cavernous internal carotid arteries. Skull: No acute fracture or focal lesion. Other: None. CT MAXILLOFACIAL FINDINGS Osseous: No fracture or mandibular dislocation. No destructive process. Sinuses/Orbits: Right maxillary sinus mucosal thickening. Paranasal sinuses and mastoid air cells are clear. The orbits are unremarkable. Soft tissues: Negative. Visualized upper cervical spine: Degenerative changes. IMPRESSION: 1. No acute intracranial abnormality. 2.  No acute displaced facial fracture. Electronically Signed   By: Morgane  Naveau M.D.   On: 08/09/2023 20:29        Scheduled Meds:  aspirin   81 mg Oral Daily   insulin aspart  0-9 Units Subcutaneous TID WC   rosuvastatin   20 mg Oral QHS   sodium chloride  flush  3 mL Intravenous Once   Continuous Infusions:   cefTRIAXone (ROCEPHIN)  IV       LOS: 0 days     Raymonde Calico, MD [facility]   If 7PM-7AM, please contact night-coverage [email or web address] Password [facility] 08/10/2023, 12:20 PM

## 2023-08-10 NOTE — Evaluation (Signed)
 Physical Therapy Evaluation Patient Details Name: Alicia Wilkerson MRN: [id number] DOB: Dec 20, 1950 Today's Date: 08/10/2023  History of Present Illness  Patient is a 73 y.o. female presenting 08/09/23 with AMS and multiple recent falls. MRI brain with patchy small volume acute right PCA territory infarcts, with a few additional punctate infarcts within the right frontal lobe. PMH significant for HTN, HLD, osteoporosis, cervical intraepithelial neoplasia II.   Clinical Impression  Alicia Wilkerson is 73 y.o. female admitted with above HPI and diagnosis. Patient is currently limited by functional impairments below (see PT problem list). Patient lives alone and is independent with no AD at baseline. Currently pt completed bed mobility at [facility] level and initially transferring at [facility] then min assist for stand steps chair>WC and WC to bed at [facility]. Pt amb ~75 with overall steady forward gait, slight drift with challenge for head turns horizontally. Pt performed 180* turn and had complete LOB towards Lt requiring Max Assist to prevent and provided seated rest to recover immediately. VS assessed and BP 151/110 mmHg, HR 83, SPO2 96% on RA. Patient will benefit from continued skilled PT interventions to address impairments and progress independence with mobility. Patient will benefit from intensive inpatient follow-up therapy, >3 hours/day. Acute PT will follow and progress as able.       If plan is discharge home, recommend the following: A lot of help with walking and/or transfers;A little help with bathing/dressing/bathroom;Assistance with cooking/housework;Assist for transportation;Help with stairs or ramp for entrance;Direct supervision/assist for medications management;Supervision due to cognitive status   Can travel by private vehicle        Equipment Recommendations  (TBA/defer to next venue)  Recommendations for Other Services  Rehab consult    Functional Status Assessment Patient has had a recent  decline in their functional status and demonstrates the ability to make significant improvements in function in a reasonable and predictable amount of time.     Precautions / Restrictions Precautions Precautions: Fall Recall of Precautions/Restrictions: Impaired Restrictions Weight Bearing Restrictions Per Provider Order: No     Muscle bulk: normal, tone normal  MMT: Mvmt Root Nerve  Muscle Right Left Comments  SA C5/6 Ax Deltoid      EF C5/6 Mc Biceps      EE C6/7/8 Rad Triceps      WF C6/7 Med FCR       WE C7/8 PIN ECU        F Ab C8/T1 U ADM/FDI      HF L1/2/3 Fem Illopsoas 4 4+ Adductor drift on Rt   KE L2/3/4 Fem Quad 4   4+    KF L5/S1/2 Sciatic Ham 4 4+   DF L4/5 D Peron Tib Ant 4 4+    PF S1/2 Tibial Grc/Sol 4 4+     Sensation:  Light touch Intact bil   Pin prick  NT   Temperature  NT   Vibration  NT  Proprioception  intact grossly    Coordination/Complex Motor:  - Finger to Nose: impaired bli, slow bil, less accurate on Lt UE - Heel to shin: intact Rt LE, lightly slow and less accurate on Lt LE - Rapid alternating movement: impaired on Lt UE, NT on LE's - Gait: min assist required with slight drift Rt, fading to Max assist to prevent full LOB with dynamic gait,  Mobility  Bed Mobility Overal bed mobility: Needs Assistance Bed Mobility: Supine to Sit, Sit to Supine     Supine to sit: Contact guard, HOB elevated  Sit to supine: Contact guard assist        Transfers Overall transfer level: Needs assistance Equipment used: None, 1 person hand held assist Transfers: Sit to/from Stand, Bed to chair/wheelchair/BSC Sit to Stand: Contact guard assist   Step pivot transfers: Contact guard assist, Min assist            Ambulation/Gait Ambulation/Gait assistance: Min assist, Max assist, +2 safety/equipment Gait Distance (Feet): 75 Feet Assistive device: None, 1 person hand held assist Gait Pattern/deviations: Step-through pattern, Decreased stride  length, Drifts right/left Gait velocity: decr        Stairs            Wheelchair Mobility     Tilt Bed    Modified Rankin (Stroke Patients Only)       Balance Overall balance assessment: Needs assistance Sitting-balance support: Feet supported Sitting balance-Leahy Scale: Good     Standing balance support: During functional activity, Single extremity supported, No upper extremity supported Standing balance-Leahy Scale: Poor Standing balance comment: significant LOB with dynamic gait (turning)                 Standardized Balance Assessment Standardized Balance Assessment : Dynamic Gait Index   Dynamic Gait Index Level Surface: Normal Gait with Horizontal Head Turns: Mild Impairment Gait and Pivot Turn: Severe Impairment       Pertinent Vitals/Pain Pain Assessment Pain Assessment: No/denies pain    Home Living Family/patient expects to be discharged to:: Private residence Living Arrangements: Alone Available Help at Discharge: Family Type of Home: House Home Access: Stairs to enter Entrance Stairs-Rails: Right;Left (wide) Entrance Stairs-Number of Steps: 4 Alternate Level Stairs-Number of Steps: flight Home Layout: Two level;Bed/bath upstairs Home Equipment: Agricultural consultant (2 wheels);Cane - single point;BSC/3in1;Wheelchair - manual;Shower seat (maybe rshower seat, WC, BSC? daughter will check)      Prior Function Prior Level of Function : Independent/Modified Independent;Driving                     Extremity/Trunk Assessment   Upper Extremity Assessment Upper Extremity Assessment: Defer to OT evaluation;Right hand dominant    Lower Extremity Assessment Lower Extremity Assessment: RLE deficits/detail;LLE deficits/detail RLE Deficits / Details: grossly 4/5 or better. Rt adductor drift noted with SLR RLE Coordination: decreased gross motor;decreased fine motor LLE Coordination: decreased gross motor;decreased fine motor     Cervical / Trunk Assessment Cervical / Trunk Assessment: Normal  Communication   Communication Communication: Impaired Factors Affecting Communication: Reduced clarity of speech    Cognition Arousal: Alert Behavior During Therapy: WFL for tasks assessed/performed   PT - Cognitive impairments: Awareness, Attention, Problem solving, Safety/Judgement, Memory                       PT - Cognition Comments: visual impairements (see OT eval). pt with poor awareness of deficits and safety. pt able to sequence standard simple tasks without cues. slow and inconsistent to follow cues for new unfamiliar tasks with testing. pt with slight memory impairment regarding timeline of events (unsure if any memory issues at baseline) pt's daughter present and reporting pt seems to be having more trouble than normal. Following commands: Intact       Cueing Cueing Techniques: Verbal cues     General Comments      Exercises     Assessment/Plan    PT Assessment Patient needs continued PT services  PT Problem List Decreased strength;Cardiopulmonary status limiting activity;Decreased knowledge of precautions;Decreased safety awareness;Decreased knowledge  of use of DME;Decreased cognition;Decreased coordination;Decreased mobility;Decreased balance;Decreased activity tolerance;Decreased range of motion       PT Treatment Interventions DME instruction;Gait training;Stair training;Functional mobility training;Therapeutic activities;Therapeutic exercise;Balance training;Neuromuscular re-education;Cognitive remediation;Patient/family education;Wheelchair mobility training;Manual techniques    PT Goals (Current goals can be found in the Care Plan section)  Acute Rehab PT Goals Patient Stated Goal: recover independence PT Goal Formulation: With patient Time For Goal Achievement: 08/24/23 Potential to Achieve Goals: Good    Frequency Min 3X/week     Co-evaluation               AM-PAC  PT 6 Clicks Mobility  Outcome Measure Help needed turning from your back to your side while in a flat bed without using bedrails?: A Little Help needed moving from lying on your back to sitting on the side of a flat bed without using bedrails?: A Little Help needed moving to and from a bed to a chair (including a wheelchair)?: A Little Help needed standing up from a chair using your arms (e.g., wheelchair or bedside chair)?: A Little Help needed to walk in hospital room?: A Lot Help needed climbing 3-5 steps with a railing? : A Lot 6 Click Score: 16    End of Session Equipment Utilized During Treatment: Gait belt Activity Tolerance: Patient tolerated treatment well Patient left: in bed;with call bell/phone within reach;with family/visitor present Nurse Communication: Mobility status PT Visit Diagnosis: Muscle weakness (generalized) (M62.81);Difficulty in walking, not elsewhere classified (R26.2);Other symptoms and signs involving the nervous system (R29.898)    Time: 1610-9604 PT Time Calculation (min) (ACUTE ONLY): 46 min   Charges:   PT Evaluation $PT Eval Moderate Complexity: 1 Mod PT Treatments $Gait Training: 8-22 mins PT General Charges $$ ACUTE PT VISIT: 1 Visit         Tish Forge, DPT Acute Rehabilitation Services Office [phone]  08/10/23 11:57 AM

## 2023-08-10 NOTE — Evaluation (Signed)
 Occupational Therapy Evaluation Patient Details Name: Alicia Wilkerson MRN: [id number] DOB: 10/01/1950 Today's Date: 08/10/2023   History of Present Illness   Patient is a 73 y.o. female presenting 08/09/23 with AMS and multiple recent falls. MRI brain with patchy small volume acute right PCA territory infarcts, with a few additional punctate infarcts within the right frontal lobe. PMH significant for HTN, HLD, osteoporosis, cervical intraepithelial neoplasia II.     Clinical Impressions PTA, pt lived alone and was independent. Upon eval, pt pleasant and conversational; presenting with decreased awareness of deficits, decreased balance, strength, coordination, vision, expressive communication. Pt needing up to min A for all ADL and min-max A for functional mobility. Pt at significantly increased risk for falls secondary to decr balance and insight into deficits. Due to significant change in functional status, previously independent, and good family support, recommending intensive multidisciplinary rehabilitation >3 hours/day to optimize safety and independence in ADL.       If plan is discharge home, recommend the following:   Two people to help with walking and/or transfers;A lot of help with bathing/dressing/bathroom;A lot of help with walking and/or transfers;Assistance with cooking/housework;Assist for transportation;Help with stairs or ramp for entrance;Direct supervision/assist for financial management;Direct supervision/assist for medications management     Functional Status Assessment   Patient has had a recent decline in their functional status and demonstrates the ability to make significant improvements in function in a reasonable and predictable amount of time.     Equipment Recommendations   Other (comment) (defer)     Recommendations for Other Services   Rehab consult;Speech consult     Precautions/Restrictions   Precautions Precautions: Fall Recall of  Precautions/Restrictions: Impaired Restrictions Weight Bearing Restrictions Per Provider Order: No     Mobility Bed Mobility Overal bed mobility: Needs Assistance Bed Mobility: Supine to Sit, Sit to Supine     Supine to sit: Contact guard, HOB elevated Sit to supine: Contact guard assist        Transfers Overall transfer level: Needs assistance Equipment used: None, 1 person hand held assist Transfers: Sit to/from Stand, Bed to chair/wheelchair/BSC Sit to Stand: Contact guard assist     Step pivot transfers: Contact guard assist, Min assist            Balance Overall balance assessment: Needs assistance Sitting-balance support: Feet supported Sitting balance-Leahy Scale: Good     Standing balance support: During functional activity, Single extremity supported, No upper extremity supported Standing balance-Leahy Scale: Poor Standing balance comment: significant LOB with dynamic gait (turning)                           ADL either performed or assessed with clinical judgement   ADL Overall ADL's : Needs assistance/impaired Eating/Feeding: Set up;Sitting   Grooming: Contact guard assist;Sitting   Upper Body Bathing: Contact guard assist;Sitting   Lower Body Bathing: Minimal assistance;Sit to/from stand   Upper Body Dressing : Contact guard assist;Sitting   Lower Body Dressing: Minimal assistance;Sit to/from stand   Toilet Transfer: Minimal assistance;Moderate assistance;+2 for physical assistance;+2 for safety/equipment           Functional mobility during ADLs: Minimal assistance;Moderate assistance;Maximal assistance;+2 for safety/equipment;+2 for physical assistance General ADL Comments: LOB with turning in hall requiring max A to correct     Vision Baseline Vision/History: 0 No visual deficits (wears contacts) Ability to See in Adequate Light: 0 Adequate Patient Visual Report: No change from baseline Vision Assessment?: Yes;Vision  impaired- to be further tested in functional context Tracking/Visual Pursuits:  (additional eye shofts occurred during tracking through midline) Saccades: Impaired - to be further tested in functional context (skipping words with attempts to read) Convergence: Impaired (comment) (R eye does not converge) Visual Fields: Impaired-to be further tested in functional context (inconsistencies during testing with poor visual report on the R visual field and max attempts to compensate and decr awareness of stimulus in LLQ as well. pt not noticing stimulus on R until nearly at midline at times.)     Perception         Praxis         Pertinent Vitals/Pain Pain Assessment Pain Assessment: No/denies pain     Extremity/Trunk Assessment Upper Extremity Assessment Upper Extremity Assessment: LUE deficits/detail LUE Deficits / Details: minimally weaker as compared to R. additionally has notably decr coordination with finger to nose, dysmetria, and dysdiadokinesia testing LUE Coordination: decreased fine motor;decreased gross motor   Lower Extremity Assessment Lower Extremity Assessment: Defer to PT evaluation   Cervical / Trunk Assessment Cervical / Trunk Assessment: Normal   Communication Communication Communication: Impaired Factors Affecting Communication: Reduced clarity of speech   Cognition Arousal: Alert Behavior During Therapy: WFL for tasks assessed/performed Cognition: Cognition impaired     Awareness: Intellectual awareness impaired, Online awareness impaired (poor awareness of deficits) Memory impairment (select all impairments): Short-term memory Attention impairment (select first level of impairment): Sustained attention Executive functioning impairment (select all impairments): Organization, Reasoning, Problem solving, Sequencing OT - Cognition Comments: pt with poor insight into deficits continuing to state she does not have slurred speech despite conformation from daughter  and therapists. pt needing repeated commands and increased time for safety awareness this session. Continued to state she was fine and did not have deficits despite LOB with needing max A to correct in hall                 Following commands: Intact       Cueing  General Comments   Cueing Techniques: Verbal cues      Exercises     Shoulder Instructions      Home Living Family/patient expects to be discharged to:: Private residence Living Arrangements: Alone Available Help at Discharge: Family Type of Home: House Home Access: Stairs to enter [employer] of Steps: 4 Entrance Stairs-Rails: Right;Left (wide) Home Layout: Two level;Bed/bath upstairs Alternate Level Stairs-Number of Steps: flight Alternate Level Stairs-Rails: Left Bathroom Shower/Tub: Chief Strategy Officer: Standard Bathroom Accessibility: Yes   Home Equipment: Agricultural consultant (2 wheels);Cane - single point;BSC/3in1;Wheelchair - manual;Shower seat (maybe rshower seat, WC, BSC? daughter will check)          Prior Functioning/Environment Prior Level of Function : Independent/Modified Independent;Driving                    OT Problem List: Decreased strength;Decreased activity tolerance;Impaired balance (sitting and/or standing);Impaired vision/perception;Decreased coordination;Decreased cognition;Decreased safety awareness;Decreased knowledge of use of DME or AE   OT Treatment/Interventions: Self-care/ADL training;Therapeutic exercise;DME and/or AE instruction;Therapeutic activities;Patient/family education;Balance training;Cognitive remediation/compensation;Visual/perceptual remediation/compensation      OT Goals(Current goals can be found in the care plan section)   Acute Rehab OT Goals OT Goal Formulation: With patient Time For Goal Achievement: 08/24/23 Potential to Achieve Goals: Good   OT Frequency:  Min 2X/week    Co-evaluation              AM-PAC  OT 6 Clicks Daily Activity  Outcome Measure Help from another person eating meals?: A Little Help from another person taking care of personal grooming?: A Little Help from another person toileting, which includes using toliet, bedpan, or urinal?: A Lot Help from another person bathing (including washing, rinsing, drying)?: A Lot Help from another person to put on and taking off regular upper body clothing?: A Little Help from another person to put on and taking off regular lower body clothing?: A Lot 6 Click Score: 15   End of Session Equipment Utilized During Treatment: Gait belt Nurse Communication: Mobility status  Activity Tolerance: Patient tolerated treatment well Patient left: in bed;with call bell/phone within reach  OT Visit Diagnosis: Unsteadiness on feet (R26.81);Muscle weakness (generalized) (M62.81);Low vision, both eyes (H54.2);Other symptoms and signs involving cognitive function;Cognitive communication deficit (R41.841)                Time: 1610-9604 OT Time Calculation (min): 47 min Charges:  OT General Charges $OT Visit: 1 Visit OT Evaluation $OT Eval Moderate Complexity: 1 Mod  Karilyn Ouch, OTR/L [facility] Acute Rehabilitation Office: [phone]   Emery Hans 08/10/2023, 4:59 PM

## 2023-08-10 NOTE — ED Notes (Signed)
 Pt had a 4 beat run of v-tach, CCMD called RN to make aware.  Admitting MD notified and new orders placed.

## 2023-08-10 NOTE — ED Notes (Signed)
 Patient transported to MRI

## 2023-08-10 NOTE — Progress Notes (Signed)

## 2023-08-10 NOTE — Consult Note (Signed)
 NEUROLOGY CONSULT NOTE   Date of service: August 10, 2023 Patient Name: Alicia Wilkerson MRN:  [id number] DOB:  1950-11-30 Chief Complaint: Altered mental status and 2 falls, strokes on MRI Requesting Provider: Janeane Mealy, MD  History of Present Illness  Alicia Wilkerson is a 73 y.o. female with hx of hypertension and hyperlipidemia who originally presented with acute encephalopathy after 2 falls over the weekend.  Patient's daughter, who lives out of state, states that she face timed with patient on 6/14 and noted that she was having some memory problems and was unable to open her right eye.  She attempted to get patient to come to the emergency department at that time, but patient declined.  Patient's daughter states that she spoke with her on 6/13 and she seemed to be in her normal state of health at that time.  Patient had 2 falls over the weekend, once when leaning forward and once when she rolled out of bed.  She also had an episode over the weekend when she was at [employer] where she was waiting in line and suddenly felt as though she needed to sit down and was informed by her Fitbit that her blood pressure was high with systolic in the 170s.  She declined to go to the emergency department at that time as well.  Yesterday, patient's daughter came to see her, found her to have confusion and memory problems and brought her to urgent care where she was then sent to the emergency department.  She was found to have a urinary tract infection as well as multiple, embolic appearing right-sided strokes on MRI.  At baseline, patient lives independently, drives and handles her own affairs.  She does endorse some recent palpitations but denies other symptoms.  LKW: 6/13 Modified rankin score: 1-No significant post stroke disability and can perform usual duties with stroke symptoms IV Thrombolysis: No, outside of window EVT: No, completed stroke on MRI  NIHSS components Score: Comment  1a Level of  Conscious 0[x]  1[]  2[]  3[]      1b LOC Questions 0[x]  1[]  2[]       1c LOC Commands 0[x]  1[]  2[]       2 Best Gaze 0[x]  1[]  2[]       3 Visual 0[x]  1[]  2[]  3[]      4 Facial Palsy 0[x]  1[]  2[]  3[]      5a Motor Arm - left 0[x]  1[]  2[]  3[]  4[]  UN[]    5b Motor Arm - Right 0[x]  1[]  2[]  3[]  4[]  UN[]    6a Motor Leg - Left 0[x]  1[]  2[]  3[]  4[]  UN[]    6b Motor Leg - Right 0[x]  1[]  2[]  3[]  4[]  UN[]    7 Limb Ataxia 0[x]  1[]  2[]  UN[]      8 Sensory 0[x]  1[]  2[]  UN[]      9 Best Language 0[x]  1[]  2[]  3[]      10 Dysarthria 0[x]  1[]  2[]  UN[]      11 Extinct. and Inattention 0[x]  1[]  2[]       TOTAL:0       ROS  Comprehensive ROS performed and pertinent positives documented in HPI   Past History   Past Medical History:  Diagnosis Date   Anemia    Anxiety disorder    Basal cell carcinoma (BCC) of left upper arm 12/2015   Basal cell carcinoma (BCC) of right lower leg 04/2016   Borderline hypertension    CAD (coronary artery disease)    a. nonobst by cor CT 12/2018.   CIN II (cervical intraepithelial  neoplasia II)    CIN  1   Depression    Eczema    Family history of premature CAD    History of basal cell carcinoma (BCC) excision    left upper arm 2017;   right lower leg 2018;  bilateral lower leg and hip area 2019;   inner right lower leg, outer right thigh, & upper outside left arm 09/ 2020   History of cervical dysplasia 2013   History of hereditary spherocytosis    s/p splenectomy in 1954   History of hyperparathyroidism    per pt yrs ago was put on mega dose of vit d which caused the hyperparathyroid resolved after stopping vit d   History of pelvic fracture 2013   MAI (mycobacterium avium-intracellulare) (HCC)    ? possibly by CT 12/2018   Mild hyperlipidemia    Osteoporosis    PONV (postoperative nausea and vomiting)    S/P hernia repair 03/02/2023   S/P splenectomy    spherocytosis   Wears glasses    White coat syndrome without diagnosis of hypertension     Past Surgical  History:  Procedure Laterality Date   ANKLE SURGERY  03/14/2015   BREAST BIOPSY Left 08/30/2021   CYSTIC APOCRINE METAPLASIA   CERVICAL CONIZATION W/BX N/A 12/13/2018   Procedure: CONIZATION CERVIX WITH BIOPSY;  Surgeon: Lillian Rein, MD;  Location: [facility] OR;  Service: Gynecology;  Laterality: N/A;   CYSTOSCOPY N/A 10/18/2019   Procedure: CYSTOSCOPY;  Surgeon: Lillian Rein, MD;  Location: [facility];  Service: Gynecology;  Laterality: N/A;   IR RADIOLOGY PERIPHERAL GUIDED IV START  01/05/2019   IR US  GUIDE VASC ACCESS RIGHT  01/05/2019   KNEE SURGERY Left 1991   per pt retained hardward   LEEP N/A 12/13/2018   Procedure: possible LOOP ELECTROSURGICAL EXCISION PROCEDURE (LEEP);  Surgeon: Lillian Rein, MD;  Location: [facility] OR;  Service: Gynecology;  Laterality: N/A;   ORIF ANKLE FRACTURE Left 03/14/2015   per pt retained hardware   ORIF WRIST FRACTURE Left 03/10/2022   Procedure: OPEN REDUCTION INTERNAL FIXATION (ORIF) WRIST FRACTURE;  Surgeon: Arvil Birks, MD;  Location: [facility] OR;  Service: Orthopedics;  Laterality: Left;  regional with iv sedation   SPLENECTOMY, TOTAL  1954   due to spherocytosis   TOTAL LAPAROSCOPIC HYSTERECTOMY WITH SALPINGECTOMY N/A 10/18/2019   Procedure: TOTAL LAPAROSCOPIC HYSTERECTOMY WITH BILATERAL SALPINGECTOMY AND BILATERAL OOPHORECTOMY;  Surgeon: Lillian Rein, MD;  Location: [facility];  Service: Gynecology;  Laterality: N/A;  BILATERAL SALPINGECTOMY POSS OOPHORECTOMY   TUBAL LIGATION Bilateral 1995    Family History: Family History  Problem Relation Age of Onset   Osteoporosis Mother    Colon cancer Mother        deceased   Heart failure Father    Skin cancer Father    Hypertension Father    Heart disease Father    Heart disease Sister    Crohn's disease Daughter    Breast cancer Neg Hx     Social History  reports that she has never smoked. She has never used smokeless tobacco. She reports that she does not  currently use alcohol. She reports that she does not use drugs.  Allergies  Allergen Reactions   Penicillins Rash    Did it involve swelling of the face/tongue/throat, SOB, or low BP? No Did it involve sudden or severe rash/hives, skin peeling, or any reaction on the inside of your mouth or nose?  #  #  #  YES  #  #  #  Did you need to seek medical attention at a hospital or doctor's office? #  #  #  YES  #  #  #  When did it last happen?   years ago    If all above answers are NO, may proceed with cephalosporin use.    Sulfa Antibiotics Hives and Other (See Comments)   Nickel Hives   Penicillin G Other (See Comments)    Medications   Current Facility-Administered Medications:    [START ON 08/11/2023]  stroke: early stages of recovery book, , Does not apply, Once, de Thayne Fine, Cortney E, NP   acetaminophen  (TYLENOL ) tablet 650 mg, 650 mg, Oral, Q6H PRN **OR** acetaminophen  (TYLENOL ) suppository 650 mg, 650 mg, Rectal, Q6H PRN, Howerter, Justin B, DO   [START ON 08/11/2023] aspirin  EC tablet 81 mg, 81 mg, Oral, Daily, Evann Erazo, MD   aspirin  tablet 325 mg, 325 mg, Oral, Once, Guilford Shannahan, MD   cefTRIAXone (ROCEPHIN) 1 g in sodium chloride  0.9 % 100 mL IVPB, 1 g, Intravenous, Q24H, Howerter, Justin B, DO   clopidogrel (PLAVIX) tablet 300 mg, 300 mg, Oral, Once, Arlo Butt, MD   [START ON 08/11/2023] clopidogrel (PLAVIX) tablet 75 mg, 75 mg, Oral, Daily, Atha Mcbain, MD   enoxaparin  (LOVENOX ) injection 40 mg, 40 mg, Subcutaneous, Q24H, Wouk, Haynes Lips, MD   escitalopram  (LEXAPRO ) tablet 10 mg, 10 mg, Oral, QHS, Wouk, Haynes Lips, MD   melatonin tablet 3 mg, 3 mg, Oral, QHS PRN, Howerter, Justin B, DO   ondansetron  (ZOFRAN ) injection 4 mg, 4 mg, Intravenous, Q6H PRN, Howerter, Justin B, DO   rosuvastatin  (CRESTOR ) tablet 20 mg, 20 mg, Oral, QHS, Howerter, Justin B, DO   sodium chloride  flush (NS) 0.9 % injection 3 mL, 3 mL, Intravenous, Once, Albertus Hughs,  DO  Current Outpatient Medications:    aspirin  EC 81 MG tablet, Take 81 mg by mouth at bedtime., Disp: , Rfl:    clonazePAM  (KLONOPIN ) 0.5 MG tablet, 1 tab by mouth once daily as needed, Disp: 30 tablet, Rfl: 0   escitalopram  (LEXAPRO ) 10 MG tablet, TAKE ONE TABLET BY MOUTH ONCE A DAY (Patient taking differently: Take 10 mg by mouth at bedtime.), Disp: 90 tablet, Rfl: 0   losartan  (COZAAR ) 25 MG tablet, Take 1 tablet (25 mg total) by mouth daily. (Patient taking differently: Take 25 mg by mouth at bedtime.), Disp: 90 tablet, Rfl: 1   Omega-3 Fatty Acids (FISH OIL) 600 MG CAPS, Take 1,200 capsules by mouth at bedtime., Disp: , Rfl:    rosuvastatin  (CRESTOR ) 20 MG tablet, Take 1 tablet (20 mg total) by mouth daily. (Patient taking differently: Take 20 mg by mouth at bedtime.), Disp: 90 tablet, Rfl: 0  Vitals   Vitals:   08/10/23 0144 08/10/23 0910 08/10/23 1214 08/10/23 1237  BP:  131/69  136/63  Pulse:  (!) 55  (!) 57  Resp:  12 13 (!) 22  Temp: 98.5 F (36.9 C) 98.4 F (36.9 C)  98.2 F (36.8 C)  TempSrc: Oral Oral  Oral  SpO2:  100%  99%  Weight:      Height:        Body mass index is 21.61 kg/m.   Physical Exam   Constitutional: Appears well-developed and well-nourished.  Psych: Appears anxious at times Eyes: No scleral injection.  HENT: No OP obstruction.  Head: Normocephalic.  Cardiovascular: Rhythm on the monitor goes between sinus rhythm, and irregular rhythm resembling  A-fib and a rapid, narrow complex tachycardia Respiratory: Effort normal, non-labored breathing.  Skin: WDI.   Neurologic Examination    NEURO:  Mental Status: Patient is alert and oriented to person place time and situation.  Attention is poor, and she sometimes asks the same question multiple times.  She is able to perform simple addition but struggles with subtraction.  She is able to spell world forwards but not backwards.  3 out of 3 registration, 2 out of 3 recall Speech/Language: speech is  without dysarthria or aphasia.  Naming, fluency, and comprehension intact.  Cranial Nerves:  II: PERRL. Visual fields full.  III, IV, VI: EOMI. ptosis of the right eye V: Sensation is intact to light touch and symmetrical to face.  VII: Smile is symmetrical.  VIII: hearing intact to voice. IX, X: Phonation is normal.  XII: tongue is midline without fasciculations. Motor: 5/5 strength to all muscle groups tested.  Tone: is normal and bulk is normal Sensation- Intact to light touch bilaterally.  Coordination: FTN intact bilaterally, intact fine finger movements bilaterally Gait- deferred   Labs/Imaging/Neurodiagnostic studies   CBC:  Recent Labs  Lab 2023/09/07 1747 09/07/23 1801 08/10/23 0542  WBC 16.7*  --  12.8*  NEUTROABS 14.1*  --  9.3*  HGB 12.6 11.6* 12.0  HCT 36.2 34.0* 33.5*  MCV 100.0  --  97.1  PLT 335  --  300   Basic Metabolic Panel:  Lab Results  Component Value Date   NA 137 08/10/2023   K 4.4 08/10/2023   CO2 26 08/10/2023   GLUCOSE 105 (H) 08/10/2023   BUN 10 08/10/2023   CREATININE 0.63 08/10/2023   CALCIUM  9.0 08/10/2023   GFRNONAA >60 08/10/2023   GFRAA >60 10/04/2019   Lipid Panel:  Lab Results  Component Value Date   LDLCALC 65 11/27/2021   HgbA1c:  Lab Results  Component Value Date   HGBA1C 3.7 (L) 08/10/2023   Urine Drug Screen: No results found for: LABOPIA, COCAINSCRNUR, LABBENZ, AMPHETMU, THCU, LABBARB  Alcohol Level     Component Value Date/Time   [facility] <15 09/07/2023 1747   INR  Lab Results  Component Value Date   INR 1.0 Sep 07, 2023   APTT  Lab Results  Component Value Date   APTT 24 09/07/23   AED levels: No results found for: PHENYTOIN, ZONISAMIDE, LAMOTRIGINE, LEVETIRACETA  CT Head without contrast(Personally reviewed): No acute abnormality  CT angio Head and Neck with contrast(Personally reviewed): Occlusion of the right ICA just distal to the bifurcation, age-indeterminate, revascularization  of right MCA via collateral flow across the circle of Willis, fetal type right PCA with stenting of your stenosis versus occlusion and origin of right P-comm, mild atheromatous change about the left carotid bulb  MRI Brain(Personally reviewed): Multiple, embolic appearing infarcts in the right cerebral hemisphere, and right MCA and PCA territory, mild atrophy  ASSESSMENT   Alicia Wilkerson is a 73 y.o. female with history of hypertension and hyperlipidemia who originally presented with altered mental status and was found to have a urinary tract infection.  MRI brain was performed, revealing multiple embolic appearing infarcts in the right MCA and PCA territories.  Patient was noted by daughter to have some confusion, memory difficulties and 2 falls over the weekend.  On exam, she is oriented to person place time and situation but has poor attention and will sometimes ask the same question repeatedly.  She has difficulty with simple subtraction and impaired recall.  Some of this may be  due to current diagnosis of UTI and also due to right thalamic stroke which is in a location which may cause some confusion and memory issues.  While examining patient, it was noted that she was in a tachycardic rhythm on the monitor, sometimes irregular and sometimes regular, will need further investigation for possible A-fib.  If atrial fibrillation is found, patient will need initiation of anticoagulation, likely with Eliquis.  Mild right ICA occlusion is age indeterminant, suspect that it is acute given sudden onset of symptoms.  Will discuss patient with neurointerventional radiology to see if angiogram or further investigation would be beneficial.  Will admit patient for full stroke workup and PT/OT/SLP evaluation.  RECOMMENDATIONS  Stroke/TIA Workup  - Admit for stroke workup - Permissive HTN x48 hrs from sx onset goal BP <220/110. PRN labetalol or hydralazine if BP above these parameters. Avoid oral  antihypertensives. - Vas US  carotid doppler to evaluate for trickle flow in the R ICA. - TTE  - Check A1c and LDL + add statin per guidelines - Load with aspirin  325 mg and Plavix 300 mg, then aspirin  81 mg daily and Plavix 75 mg daily antiplt/anticoag - q4 hr neuro checks - STAT head CT for any change in neuro exam - Tele - PT/OT/SLP - Stroke education - Amb referral to neurology upon discharge   ______________________________________________________________________  Patient seen by NP with MD, MD to edit note as needed.  Plan discusseed with daughter at the bedside and with hospitalist team over secure chat.  Signed, Cortney E Bucky Cardinal, NP [facility] NEUROHOSPITALIST ADDENDUM Performed a face to face diagnostic evaluation.   I have reviewed the contents of history and physical exam as documented by PA/ARNP/Resident and agree with above documentation.  I have discussed and formulated the above plan as documented. Edits to the note have been made as needed.  Proctor Carriker, MD [facility] [phone]   If 7pm to 7am, please call on call as listed on AMION.

## 2023-08-11 ENCOUNTER — Encounter (HOSPITAL_COMMUNITY): Payer: Self-pay | Admitting: Internal Medicine

## 2023-08-11 ENCOUNTER — Inpatient Hospital Stay (HOSPITAL_COMMUNITY)

## 2023-08-11 DIAGNOSIS — N3000 Acute cystitis without hematuria: Secondary | ICD-10-CM | POA: Diagnosis not present

## 2023-08-11 DIAGNOSIS — I63511 Cerebral infarction due to unspecified occlusion or stenosis of right middle cerebral artery: Secondary | ICD-10-CM | POA: Diagnosis not present

## 2023-08-11 DIAGNOSIS — F419 Anxiety disorder, unspecified: Secondary | ICD-10-CM

## 2023-08-11 DIAGNOSIS — I6523 Occlusion and stenosis of bilateral carotid arteries: Secondary | ICD-10-CM

## 2023-08-11 DIAGNOSIS — N390 Urinary tract infection, site not specified: Secondary | ICD-10-CM

## 2023-08-11 DIAGNOSIS — I471 Supraventricular tachycardia, unspecified: Secondary | ICD-10-CM

## 2023-08-11 DIAGNOSIS — I6521 Occlusion and stenosis of right carotid artery: Secondary | ICD-10-CM | POA: Diagnosis not present

## 2023-08-11 DIAGNOSIS — R000 Tachycardia, unspecified: Secondary | ICD-10-CM | POA: Diagnosis not present

## 2023-08-11 DIAGNOSIS — I639 Cerebral infarction, unspecified: Secondary | ICD-10-CM

## 2023-08-11 DIAGNOSIS — I6389 Other cerebral infarction: Secondary | ICD-10-CM | POA: Diagnosis not present

## 2023-08-11 DIAGNOSIS — K0889 Other specified disorders of teeth and supporting structures: Secondary | ICD-10-CM | POA: Diagnosis not present

## 2023-08-11 DIAGNOSIS — G9340 Encephalopathy, unspecified: Secondary | ICD-10-CM | POA: Diagnosis not present

## 2023-08-11 DIAGNOSIS — R29700 NIHSS score 0: Secondary | ICD-10-CM | POA: Diagnosis not present

## 2023-08-11 DIAGNOSIS — E785 Hyperlipidemia, unspecified: Secondary | ICD-10-CM

## 2023-08-11 DIAGNOSIS — I63531 Cerebral infarction due to unspecified occlusion or stenosis of right posterior cerebral artery: Secondary | ICD-10-CM | POA: Diagnosis not present

## 2023-08-11 DIAGNOSIS — R739 Hyperglycemia, unspecified: Secondary | ICD-10-CM | POA: Diagnosis not present

## 2023-08-11 DIAGNOSIS — I2510 Atherosclerotic heart disease of native coronary artery without angina pectoris: Secondary | ICD-10-CM

## 2023-08-11 DIAGNOSIS — I10 Essential (primary) hypertension: Secondary | ICD-10-CM

## 2023-08-11 LAB — CBC
HCT: 34 % — ABNORMAL LOW (ref 36.0–46.0)
Hemoglobin: 12 g/dL (ref 12.0–15.0)
MCH: 34.5 pg — ABNORMAL HIGH (ref 26.0–34.0)
MCHC: 35.3 g/dL (ref 30.0–36.0)
MCV: 97.7 fL (ref 80.0–100.0)
Platelets: 346 10*3/uL (ref 150–400)
RBC: 3.48 MIL/uL — ABNORMAL LOW (ref 3.87–5.11)
RDW: 16.1 % — ABNORMAL HIGH (ref 11.5–15.5)
WBC: 11.6 10*3/uL — ABNORMAL HIGH (ref 4.0–10.5)
nRBC: 0 % (ref 0.0–0.2)

## 2023-08-11 LAB — ECHOCARDIOGRAM COMPLETE
Area-P 1/2: 2.9 cm2
Calc EF: 65.9 %
Height: 63 in
S' Lateral: 1.7 cm
Single Plane A2C EF: 65.4 %
Single Plane A4C EF: 68 %
Weight: 1978.85 [oz_av]

## 2023-08-11 LAB — LIPID PANEL
Cholesterol: 128 mg/dL (ref 0–200)
HDL: 52 mg/dL (ref >40)
LDL Cholesterol: 64 mg/dL (ref 0–99)
Total CHOL/HDL Ratio: 2.5 ratio
Triglycerides: 59 mg/dL (ref <150)
VLDL: 12 mg/dL (ref 0–40)

## 2023-08-11 LAB — BASIC METABOLIC PANEL WITH GFR
Anion gap: 9 (ref 5–15)
BUN: 9 mg/dL (ref 8–23)
CO2: 25 mmol/L (ref 22–32)
Calcium: 8.7 mg/dL — ABNORMAL LOW (ref 8.9–10.3)
Chloride: 101 mmol/L (ref 98–111)
Creatinine, Ser: 0.68 mg/dL (ref 0.44–1.00)
GFR, Estimated: >60 mL/min (ref >60)
Glucose, Bld: 106 mg/dL — ABNORMAL HIGH (ref 70–99)
Potassium: 3.6 mmol/L (ref 3.5–5.1)
Sodium: 135 mmol/L (ref 135–145)

## 2023-08-11 LAB — CULTURE, OB URINE: Culture: <10000 — AB

## 2023-08-11 MED ORDER — MAGNESIUM OXIDE -MG SUPPLEMENT 400 (240 MG) MG PO TABS
400.0000 mg | ORAL_TABLET | Freq: Two times a day (BID) | ORAL | Status: DC
  Administered 2023-08-11 – 2023-08-12 (×3): 400 mg via ORAL
  Filled 2023-08-11 (×3): qty 1

## 2023-08-11 MED ORDER — POTASSIUM CHLORIDE CRYS ER 20 MEQ PO TBCR
40.0000 meq | EXTENDED_RELEASE_TABLET | ORAL | Status: AC
  Administered 2023-08-11 (×2): 40 meq via ORAL
  Filled 2023-08-11 (×2): qty 2

## 2023-08-11 MED ORDER — CLONAZEPAM 0.5 MG PO TABS: 0.5000 mg | ORAL_TABLET | Freq: Two times a day (BID) | ORAL | Status: DC | PRN

## 2023-08-11 NOTE — Consult Note (Addendum)
 Physical Medicine and Rehabilitation Consult Reason for Consult:Rehab Referring Physician: Dr. Farrel Hones   HPI: Alicia Wilkerson is a 73 y.o. female with past medical history of hypertension and hyperlipidemia who presented with altered mental status after being confused for 1 to 2 days.  Patient had a fall where she hit her head on the floor.  Patient had an MRI of her brain which showed acute ischemic right PCA territory infarcts with a few additional punctate infarcts within the right frontal lobe.  She is also found to have possible cystitis and was treated with ceftriaxone.  Patient was seen by neurology and she has been started on aspirin  and Plavix.  Pt reports episodes when her HR has been elevated.  Patient was seen by PT and OT and found to have functional deficits and intensive inpatient rehab recommended.  Chart reviewed indicates two-level home with 4 steps to enter.  Family available to assist 24/7 after discharge for temporary period. At baseline she lives independently.  Home: Home Living Family/patient expects to be discharged to:: Private residence Living Arrangements: Alone Available Help at Discharge: Family Type of Home: House Home Access: Stairs to enter Secretary/administrator of Steps: 4 Entrance Stairs-Rails: Right, Left (wide) Home Layout: Two level, Bed/bath upstairs Alternate Level Stairs-Number of Steps: flight Alternate Level Stairs-Rails: Left Bathroom Shower/Tub: Engineer, manufacturing systems: Standard Bathroom Accessibility: Yes Home Equipment: Agricultural consultant (2 wheels), [employer] - single point, BSC/3in1, Wheelchair - manual, Shower seat (maybe rshower seat, WC, BSC? daughter will check)  Functional History: Prior Function Prior Level of Function : Independent/Modified Independent, Driving Functional Status:  Mobility: Bed Mobility Overal bed mobility: Needs Assistance Bed Mobility: Supine to Sit, Sit to Supine Supine to sit: Contact guard, HOB  elevated Sit to supine: Contact guard assist Transfers Overall transfer level: Needs assistance Equipment used: None, 1 person hand held assist Transfers: Sit to/from Stand, Bed to chair/wheelchair/BSC Sit to Stand: Contact guard assist Bed to/from chair/wheelchair/BSC transfer type:: Step pivot Step pivot transfers: Contact guard assist, Min assist Ambulation/Gait Ambulation/Gait assistance: Min assist, Max assist, +2 safety/equipment Gait Distance (Feet): 75 Feet Assistive device: None, 1 person hand held assist Gait Pattern/deviations: Step-through pattern, Decreased stride length, Drifts right/left Gait velocity: decr    ADL: ADL Overall ADL's : Needs assistance/impaired Eating/Feeding: Set up, Sitting Grooming: Contact guard assist, Sitting Upper Body Bathing: Contact guard assist, Sitting Lower Body Bathing: Minimal assistance, Sit to/from stand Upper Body Dressing : Contact guard assist, Sitting Lower Body Dressing: Minimal assistance, Sit to/from stand Toilet Transfer: Minimal assistance, Moderate assistance, +2 for physical assistance, +2 for safety/equipment Functional mobility during ADLs: Minimal assistance, Moderate assistance, Maximal assistance, +2 for safety/equipment, +2 for physical assistance General ADL Comments: LOB with turning in hall requiring max A to correct  Cognition:   Cognition Arousal: Alert Behavior During Therapy: WFL for tasks assessed/performed   Review of Systems  Constitutional:  Negative for chills and fever.  HENT:  Negative for congestion.        Toothache  Eyes:  Negative for blurred vision and double vision.  Respiratory:  Negative for shortness of breath.   Cardiovascular:  Positive for palpitations. Negative for chest pain.  Gastrointestinal:  Negative for abdominal pain, diarrhea, nausea and vomiting.  Genitourinary:        + UTI  Musculoskeletal:  Negative for joint pain.  Skin:  Negative for rash.  Neurological:  Negative  for tingling, sensory change, speech change, focal weakness and headaches.  Psychiatric/Behavioral:  The patient is nervous/anxious.    Past Medical History:  Diagnosis Date   Anemia    Anxiety disorder    Basal cell carcinoma (BCC) of left upper arm 12/2015   Basal cell carcinoma (BCC) of right lower leg 04/2016   Borderline hypertension    CAD (coronary artery disease)    a. nonobst by cor CT 12/2018.   CIN II (cervical intraepithelial neoplasia II)    CIN  1   Depression    Eczema    Family history of premature CAD    History of basal cell carcinoma (BCC) excision    left upper arm 2017;   right lower leg 2018;  bilateral lower leg and hip area 2019;   inner right lower leg, outer right thigh, & upper outside left arm 09/ 2020   History of cervical dysplasia 2013   History of hereditary spherocytosis    s/p splenectomy in 1954   History of hyperparathyroidism    per pt yrs ago was put on mega dose of vit d which caused the hyperparathyroid resolved after stopping vit d   History of pelvic fracture 2013   MAI (mycobacterium avium-intracellulare) (HCC)    ? possibly by CT 12/2018   Mild hyperlipidemia    Osteoporosis    PONV (postoperative nausea and vomiting)    S/P hernia repair 03/02/2023   S/P splenectomy    spherocytosis   Wears glasses    White coat syndrome without diagnosis of hypertension    Past Surgical History:  Procedure Laterality Date   ANKLE SURGERY  03/14/2015   BREAST BIOPSY Left 08/30/2021   CYSTIC APOCRINE METAPLASIA   CERVICAL CONIZATION W/BX N/A 12/13/2018   Procedure: CONIZATION CERVIX WITH BIOPSY;  Surgeon: Lillian Rein, MD;  Location: [facility] OR;  Service: Gynecology;  Laterality: N/A;   CYSTOSCOPY N/A 10/18/2019   Procedure: CYSTOSCOPY;  Surgeon: Lillian Rein, MD;  Location: [facility];  Service: Gynecology;  Laterality: N/A;   IR RADIOLOGY PERIPHERAL GUIDED IV START  01/05/2019   IR US  GUIDE VASC ACCESS RIGHT  01/05/2019   KNEE  SURGERY Left 1991   per pt retained hardward   LEEP N/A 12/13/2018   Procedure: possible LOOP ELECTROSURGICAL EXCISION PROCEDURE (LEEP);  Surgeon: Lillian Rein, MD;  Location: [facility] OR;  Service: Gynecology;  Laterality: N/A;   ORIF ANKLE FRACTURE Left 03/14/2015   per pt retained hardware   ORIF WRIST FRACTURE Left 03/10/2022   Procedure: OPEN REDUCTION INTERNAL FIXATION (ORIF) WRIST FRACTURE;  Surgeon: Arvil Birks, MD;  Location: [facility] OR;  Service: Orthopedics;  Laterality: Left;  regional with iv sedation   SPLENECTOMY, TOTAL  1954   due to spherocytosis   TOTAL LAPAROSCOPIC HYSTERECTOMY WITH SALPINGECTOMY N/A 10/18/2019   Procedure: TOTAL LAPAROSCOPIC HYSTERECTOMY WITH BILATERAL SALPINGECTOMY AND BILATERAL OOPHORECTOMY;  Surgeon: Lillian Rein, MD;  Location: [facility];  Service: Gynecology;  Laterality: N/A;  BILATERAL SALPINGECTOMY POSS OOPHORECTOMY   TUBAL LIGATION Bilateral 1995   Family History  Problem Relation Age of Onset   Osteoporosis Mother    Colon cancer Mother        deceased   Heart failure Father    Skin cancer Father    Hypertension Father    Heart disease Father    Heart disease Sister    Crohn's disease Daughter    Breast cancer Neg Hx    Social History:  reports that she has never smoked. She has never used smokeless tobacco.  She reports that she does not currently use alcohol. She reports that she does not use drugs. Allergies:  Allergies  Allergen Reactions   Penicillins Rash    Did it involve swelling of the face/tongue/throat, SOB, or low BP? No Did it involve sudden or severe rash/hives, skin peeling, or any reaction on the inside of your mouth or nose?  #  #  #  YES  #  #  #  Did you need to seek medical attention at a hospital or doctor's office? #  #  #  YES  #  #  #  When did it last happen?   years ago    If all above answers are NO, may proceed with cephalosporin use.    Sulfa Antibiotics Hives and Other (See Comments)    Nickel Hives   Penicillin G Other (See Comments)   Medications Prior to Admission  Medication Sig Dispense Refill   aspirin  EC 81 MG tablet Take 81 mg by mouth at bedtime.     clonazePAM  (KLONOPIN ) 0.5 MG tablet 1 tab by mouth once daily as needed 30 tablet 0   escitalopram  (LEXAPRO ) 10 MG tablet TAKE ONE TABLET BY MOUTH ONCE A DAY (Patient taking differently: Take 10 mg by mouth at bedtime.) 90 tablet 0   losartan  (COZAAR ) 25 MG tablet Take 1 tablet (25 mg total) by mouth daily. (Patient taking differently: Take 25 mg by mouth at bedtime.) 90 tablet 1   Omega-3 Fatty Acids (FISH OIL) 600 MG CAPS Take 1,200 capsules by mouth at bedtime.     rosuvastatin  (CRESTOR ) 20 MG tablet Take 1 tablet (20 mg total) by mouth daily. (Patient taking differently: Take 20 mg by mouth at bedtime.) 90 tablet 0     Blood pressure (!) 122/57, pulse 61, temperature 98 F (36.7 C), temperature source Oral, resp. rate 20, height 5' 3 (1.6 m), weight 56.1 kg, last menstrual period 02/24/2002, SpO2 95%. Physical Exam  General: No apparent distress HEENT: Head is normocephalic, atraumatic, sclera anicteric, oral mucosa pink and moist, wearing glasses Neck: Supple without JVD or lymphadenopathy Heart: Reg rate and rhythm. No murmurs rubs or gallops Chest: CTA bilaterally without wheezes, rales, or rhonchi; no distress Abdomen: Soft, non-tender, non-distended, bowel sounds positive. Extremities: No clubbing, cyanosis, or edema. Pulses are 2+ Psych: Pt's affect is appropriate. Pt is cooperative Skin: Clean and intact without signs of breakdown Neuro:    Mental Status: AAOx4, fund of knowledge appropriate. Mildly altered attention.  Able to spell world forwards, 1 error with world Backwards Speech/Languate: Naming and repetition intact, fluent, follows simple commands CRANIAL NERVES: II: PERRL. Visual fields full III, IV, VI: EOM intact, no gaze preference or deviation, right eye ptosis V: normal sensation  bilaterally VII: no asymmetry VIII: normal hearing to speech IX, X: normal palatal elevation XI: 5/5 head turn and 5/5 shoulder shrug bilaterally XII: Tongue midline   MOTOR: RUE: 5/5 Deltoid, 5/5 Biceps, 5/5 Triceps,5/5 Grip LUE: 5/5 Deltoid, 5/5 Biceps, 5/5 Triceps, 5/5 Grip RLE: HF 5/5, KE 5/5, ADF 5/5, APF 5/5 LLE: HF 5/5, KE 5/5, ADF 5/5, APF 5/5   REFLEXES: No ankle clonus  SENSORY: Normal to touch all 4 extremities  Coordination: Normal finger to nose and heel to shin, no tremor    Results for orders placed or performed during the hospital encounter of 08/09/23 (from the past 24 hours)  Lipid panel     Status: None   Collection Time: 08/11/23  6:13 AM  Result  Value Ref Range   Cholesterol 128 0 - 200 mg/dL   Triglycerides 59 <161 mg/dL   HDL 52 >09 mg/dL   Total CHOL/HDL Ratio 2.5 RATIO   VLDL 12 0 - 40 mg/dL   LDL Cholesterol 64 0 - 99 mg/dL  CBC     Status: Abnormal   Collection Time: 08/11/23  6:13 AM  Result Value Ref Range   WBC 11.6 (H) 4.0 - 10.5 K/uL   RBC 3.48 (L) 3.87 - 5.11 MIL/uL   Hemoglobin 12.0 12.0 - 15.0 g/dL   HCT 60.4 (L) 54.0 - 98.1 %   MCV 97.7 80.0 - 100.0 fL   MCH 34.5 (H) 26.0 - 34.0 pg   MCHC 35.3 30.0 - 36.0 g/dL   RDW 19.1 (H) 47.8 - 29.5 %   Platelets 346 150 - 400 K/uL   nRBC 0.0 0.0 - 0.2 %  Basic metabolic panel with GFR     Status: Abnormal   Collection Time: 08/11/23  6:13 AM  Result Value Ref Range   Sodium 135 135 - 145 mmol/L   Potassium 3.6 3.5 - 5.1 mmol/L   Chloride 101 98 - 111 mmol/L   CO2 25 22 - 32 mmol/L   Glucose, Bld 106 (H) 70 - 99 mg/dL   BUN 9 8 - 23 mg/dL   Creatinine, Ser 6.21 0.44 - 1.00 mg/dL   Calcium  8.7 (L) 8.9 - 10.3 mg/dL   GFR, Estimated >30 >86 mL/min   Anion gap 9 5 - 15   ECHOCARDIOGRAM COMPLETE Result Date: 08/11/2023    ECHOCARDIOGRAM REPORT   Patient Name:   BARB SHEAR Date of Exam: 08/11/2023 Medical Rec #:  [id number]       Height:       63.0 in Accession #:    [id number]       Weight:       123.7 lb Date of Birth:  1951/01/13       BSA:          1.576 m Patient Age:    73 years        BP:           129/79 mmHg Patient Gender: F               HR:           60 bpm. Exam Location:  Inpatient Procedure: 2D Echo, Cardiac Doppler and Color Doppler (Both Spectral and Color            Flow Doppler were utilized during procedure). Indications:    CVA  History:        Patient has prior history of Echocardiogram examinations, most                 recent 11/14/2018.  Sonographer:    Griselda Lederer Referring Phys: [id number] CORTNEY E DE LA TORRE IMPRESSIONS  1. Left ventricular ejection fraction, by estimation, is 60 to 65%. The left ventricle has normal function. The left ventricle has no regional wall motion abnormalities. There is mild left ventricular hypertrophy. Left ventricular diastolic parameters are consistent with Grade I diastolic dysfunction (impaired relaxation). Consider evaluation for infiltrative cardiomyopathy. (I.E HCM, amyloid)  2. Right ventricular systolic function is normal. The right ventricular size is normal. Mildly increased right ventricular wall thickness.  3. Left atrial size was moderately dilated.  4. Small to moderate sized pericardial effusion. No signs of tamponade.  5. The mitral valve is normal in structure. No  evidence of mitral valve regurgitation. No evidence of mitral stenosis.  6. The aortic valve is normal in structure. Aortic valve regurgitation is not visualized. No aortic stenosis is present.  7. The inferior vena cava is normal in size with greater than 50% respiratory variability, suggesting right atrial pressure of 3 mmHg. FINDINGS  Left Ventricle: Left ventricular ejection fraction, by estimation, is 60 to 65%. The left ventricle has normal function. The left ventricle has no regional wall motion abnormalities. The left ventricular internal cavity size was normal in size. There is  mild left ventricular hypertrophy. Left ventricular diastolic parameters are  consistent with Grade I diastolic dysfunction (impaired relaxation). Right Ventricle: The right ventricular size is normal. Mildly increased right ventricular wall thickness. Right ventricular systolic function is normal. Left Atrium: Left atrial size was moderately dilated. Right Atrium: Right atrial size was normal in size. Pericardium: A moderately sized pericardial effusion is present. Mitral Valve: The mitral valve is normal in structure. No evidence of mitral valve regurgitation. No evidence of mitral valve stenosis. Tricuspid Valve: The tricuspid valve is normal in structure. Tricuspid valve regurgitation is not demonstrated. No evidence of tricuspid stenosis. Aortic Valve: The aortic valve is normal in structure. There is mild aortic valve annular calcification. Aortic valve regurgitation is not visualized. No aortic stenosis is present. Pulmonic Valve: The pulmonic valve was normal in structure. Pulmonic valve regurgitation is not visualized. No evidence of pulmonic stenosis. Aorta: The aortic root is normal in size and structure. Venous: The inferior vena cava is normal in size with greater than 50% respiratory variability, suggesting right atrial pressure of 3 mmHg. IAS/Shunts: No atrial level shunt detected by color flow Doppler.  LEFT VENTRICLE PLAX 2D LVIDd:         3.00 cm     Diastology LVIDs:         1.70 cm     LV e' medial:    4.35 cm/s LV PW:         1.10 cm     LV E/e' medial:  16.3 LV IVS:        1.30 cm     LV e' lateral:   5.55 cm/s LVOT diam:     1.90 cm     LV E/e' lateral: 12.8 LVOT Area:     2.84 cm  LV Volumes (MOD) LV vol d, MOD A2C: 38.2 ml LV vol d, MOD A4C: 37.5 ml LV vol s, MOD A2C: 13.2 ml LV vol s, MOD A4C: 12.0 ml LV SV MOD A2C:     25.0 ml LV SV MOD A4C:     37.5 ml LV SV MOD BP:      25.0 ml RIGHT VENTRICLE             IVC RV Basal diam:  2.80 cm     IVC diam: 0.90 cm RV S prime:     25.60 cm/s TAPSE (M-mode): 1.4 cm LEFT ATRIUM           Index LA diam:      3.90 cm 2.47 cm/m  LA Vol (A4C): 65.8 ml 41.74 ml/m   AORTA Ao Root diam: 3.00 cm Ao Asc diam:  3.20 cm MITRAL VALVE               TRICUSPID VALVE MV Area (PHT): 2.90 cm    TR Peak grad:   19.5 mmHg MV Decel Time: 262 msec    TR Vmax:  221.00 cm/s MV E velocity: 71.10 cm/s MV A velocity: 71.80 cm/s  SHUNTS MV E/A ratio:  0.99        Systemic Diam: 1.90 cm Aditya Sabharwal Electronically signed by Alwin Baars Signature Date/Time: 08/11/2023/9:19:13 AM    Final    CT HEAD WO CONTRAST ( ) Result Date: 08/10/2023 CLINICAL DATA:  Headache. EXAM: CT HEAD WITHOUT CONTRAST TECHNIQUE: Contiguous axial images were obtained from the base of the skull through the vertex without intravenous contrast. RADIATION DOSE REDUCTION: This exam was performed according to the departmental dose-optimization program which includes automated exposure control, adjustment of the mA and/or kV according to patient size and/or use of iterative reconstruction technique. COMPARISON:  August 09, 2023, MR head dated August 10, 2023 FINDINGS: Brain: There is generalized cerebral atrophy with widening of the extra-axial spaces and ventricular dilatation. There are areas of decreased attenuation within the white matter tracts of the supratentorial brain, consistent with microvascular disease changes. The patchy small volume acute right PCA territory infarct seen on the earlier MR head (August 10, 2023) is not clearly visualized on the current plain brain CT. Vascular: Marked severity bilateral cavernous carotid artery calcification is noted. Skull: Normal. Negative for fracture or focal lesion. Sinuses/Orbits: There is mild left ethmoid sinus and mild sphenoid sinus mucosal thickening. Other: None. IMPRESSION: 1. Generalized cerebral atrophy with widening of the extra-axial spaces and ventricular dilatation. 2. The patchy small volume acute right PCA territory infarct seen on the earlier MR head is not clearly visualized on the current plain brain CT. 3. Mild  left ethmoid sinus and mild sphenoid sinus mucosal thickening. Electronically Signed   By: Virgle Grime M.D.   On: 08/10/2023 18:28   MR BRAIN WO CONTRAST Result Date: 08/10/2023 CLINICAL DATA:  Initial evaluation for acute neuro deficit, stroke suspected. EXAM: MRI HEAD WITHOUT CONTRAST TECHNIQUE: Multiplanar, multiecho pulse sequences of the brain and surrounding structures were obtained without intravenous contrast. COMPARISON:  CTs from 08/09/2023 FINDINGS: Brain: Mildly advanced cerebral atrophy. No significant cerebral white matter disease for age. 1.2 cm acute ischemic nonhemorrhagic infarct present at the ventral right thalamic capsular region (series 5, image 75). Patchy small volume acute ischemic nonhemorrhagic infarcts at the right occipital lobe, right PCA distribution (series 5, images 68, 70, 73). Mild patchy involvement of the mesial right temporal lobe/right hippocampus (series 5, images 72, 69). Few additional punctate acute ischemic nonhemorrhagic infarcts about the right frontal lobe (series 5, images 83, 80). No other evidence for acute or subacute infarct. Gray-white matter differentiation otherwise maintained. No acute or chronic intracranial blood products. No mass lesion, midline shift or mass effect. No hydrocephalus or extra-axial fluid collection. Prominent dural calcifications noted along the anterior falx. Pituitary gland within normal limits. Vascular: Major intracranial vascular flow voids are maintained. Skull and upper cervical spine: Craniocervical junction within normal limits. Bone marrow signal intensity normal. No scalp soft tissue abnormality. Sinuses/Orbits: Globes orbital soft tissues within normal limits. Small right maxillary sinus retention cyst noted. Small volume pneumatized secretions noted within the left sphenoid sinus. No significant mastoid effusion. Other: None. IMPRESSION: 1. Patchy small volume acute ischemic nonhemorrhagic right PCA territory infarcts  as above, with a few additional punctate infarcts within the right frontal lobe. 2. Underlying mildly advanced cerebral atrophy. Electronically Signed   By: Virgia Griffins M.D.   On: 08/10/2023 02:39   CT ANGIO HEAD NECK W WO CM Result Date: 08/09/2023 CLINICAL DATA:  Initial evaluation for acute diplopia, Horner syndrome. EXAM: CT ANGIOGRAPHY HEAD  AND NECK WITH AND WITHOUT CONTRAST TECHNIQUE: Multidetector CT imaging of the head and neck was performed using the standard protocol during bolus administration of intravenous contrast. Multiplanar CT image reconstructions and MIPs were obtained to evaluate the vascular anatomy. Carotid stenosis measurements (when applicable) are obtained utilizing NASCET criteria, using the distal internal carotid diameter as the denominator. RADIATION DOSE REDUCTION: This exam was performed according to the departmental dose-optimization program which includes automated exposure control, adjustment of the mA and/or kV according to patient size and/or use of iterative reconstruction technique. CONTRAST:  75mL OMNIPAQUE  IOHEXOL  350 MG/ML SOLN COMPARISON:  CT from earlier the same day. FINDINGS: CTA NECK FINDINGS Aortic arch: Visualized aortic arch within normal limits for caliber. Origin of the great vessels incompletely visualized on this exam. Mild aortic atherosclerosis. Right carotid system: Right CCA tortuous but patent without stenosis. Atheromatous change about the right carotid bulb. There is occlusion of the right ICA just distal to the bifurcation (series 7, image 110). Right ICA remains occluded to the terminus. Left carotid system: Left common and internal carotid arteries are tortuous but patent without dissection. Mild atheromatous change about the left carotid bulb without hemodynamically significant stenosis. Vertebral arteries: Left vertebral artery hypoplastic and likely arises from the aortic arch, although the origin is not visualized. Atheromatous change at  the origin of the dominant right vertebral artery with mild stenosis. Visualized vertebral arteries patent without stenosis or dissection. Skeleton: No worrisome osseous lesions. Moderate multilevel cervical spondylosis, most pronounced at C5-6 and C6-7. Other neck: No other acute finding. Upper chest: Small area of reticulonodular tree-in-bud densities within the left upper lobe, likely mild infection/small airways disease (series 7, image 138). No other acute finding. Review of the MIP images confirms the above findings CTA HEAD FINDINGS Anterior circulation: For atheromatous change about the left carotid siphon without hemodynamically significant stenosis. Right ICA remains occluded to the terminus. A1 segments patent bilaterally. Left A1 dominant. Normal anterior communicating artery complex. Anterior cerebral arteries patent without stenosis. Left M1 segment and distal left MCA branches are widely patent and well perfused. Revascularization of the right MCA via collateral flow across the circle-of-Willis. Right MCA patent and perfused. Posterior circulation: Dominant right V4 segment patent without stenosis. Right PICA patent. Hypoplastic left vertebral artery largely terminates in PICA, although a tiny branch ascending towards the vertebrobasilar junction. Left PICA patent as well. Basilar patent without stenosis. Superior cerebral arteries patent bilaterally. Left PCA supplied via a hypoplastic left P1 segment and prominent left posterior communicating artery. Left PCA patent to its distal aspect without significant stenosis. Fetal type origin of the right PCA with severe stenosis versus occlusion at the origin of the right PCOM (series 8, image 107). Right PCA attenuated but patent distally without visible stenosis. Venous sinuses: Patent allowing for timing the contrast bolus. Anatomic variants: As above.  No aneurysm. Review of the MIP images confirms the above findings IMPRESSION: 1. Occlusion of the  right ICA just distal to the bifurcation, age indeterminate, but presumably acute given patient's symptoms. Right ICA remains occluded to the terminus. Revascularization of the right MCA via collateral flow across the circle-of-Willis. 2. Fetal type origin of the right PCA with severe stenosis versus occlusion at the origin of the right PCOM. Right PCA attenuated but patent distally. 3. Mild atheromatous change about the left carotid bulb and left carotid siphon without hemodynamically significant stenosis. 4. Small area of reticulonodular tree-in-bud densities within the left upper lobe, likely mild infection/small airways disease. 5.  Aortic  Atherosclerosis (ICD10-I70.0). Critical Value/emergent results were called by telephone at the time of interpretation on 08/09/2023 at 10:40 pm to provider DAN FLOYD , who verbally acknowledged these results. Electronically Signed   By: Virgia Griffins M.D.   On: 08/09/2023 22:43   DG Chest 2 View Result Date: 08/09/2023 EXAM: 2 VIEW(S) XRAY OF THE CHEST 08/09/2023 09:22:00 PM COMPARISON: 02/23/2022 CLINICAL HISTORY: r/o mass. AMS FINDINGS: LUNGS AND PLEURA: Mild lingular and right middle lobe scarring, chronic. Calcified granuloma in the left lower lung. No pulmonary edema. No pleural effusion. No pneumothorax. HEART AND MEDIASTINUM: No acute abnormality of the cardiac and mediastinal silhouettes. Thoracic aortic atherosclerosis. BONES AND SOFT TISSUES: No acute osseous abnormality. IMPRESSION: 1. No acute findings. Electronically signed by: Zadie Herter MD 08/09/2023 09:28 PM EDT RP Workstation: [id number]   CT HEAD WO CONTRAST Result Date: 08/09/2023 CLINICAL DATA:  Neuro deficit, acute, stroke suspected; Facial trauma, blunt EXAM: CT HEAD WITHOUT CONTRAST CT MAXILLOFACIAL WITHOUT CONTRAST TECHNIQUE: Multidetector CT imaging of the head and maxillofacial structures were performed using the standard protocol without intravenous contrast. Multiplanar CT image  reconstructions of the maxillofacial structures were also generated. RADIATION DOSE REDUCTION: This exam was performed according to the departmental dose-optimization program which includes automated exposure control, adjustment of the mA and/or kV according to patient size and/or use of iterative reconstruction technique. COMPARISON:  None Available. FINDINGS: CT HEAD FINDINGS Brain: Atherosclerotic calcifications are present within the cavernous internal carotid arteries. No evidence of large-territorial acute infarction. No parenchymal hemorrhage. No mass lesion. No extra-axial collection. No mass effect or midline shift. No hydrocephalus. Basilar cisterns are patent. Vascular: No hyperdense vessel. Atherosclerotic calcifications are present within the cavernous internal carotid arteries. Skull: No acute fracture or focal lesion. Other: None. CT MAXILLOFACIAL FINDINGS Osseous: No fracture or mandibular dislocation. No destructive process. Sinuses/Orbits: Right maxillary sinus mucosal thickening. Paranasal sinuses and mastoid air cells are clear. The orbits are unremarkable. Soft tissues: Negative. Visualized upper cervical spine: Degenerative changes. IMPRESSION: 1. No acute intracranial abnormality. 2.  No acute displaced facial fracture. Electronically Signed   By: Morgane  Naveau M.D.   On: 08/09/2023 20:29   CT Maxillofacial Wo Contrast Result Date: 08/09/2023 CLINICAL DATA:  Neuro deficit, acute, stroke suspected; Facial trauma, blunt EXAM: CT HEAD WITHOUT CONTRAST CT MAXILLOFACIAL WITHOUT CONTRAST TECHNIQUE: Multidetector CT imaging of the head and maxillofacial structures were performed using the standard protocol without intravenous contrast. Multiplanar CT image reconstructions of the maxillofacial structures were also generated. RADIATION DOSE REDUCTION: This exam was performed according to the departmental dose-optimization program which includes automated exposure control, adjustment of the mA  and/or kV according to patient size and/or use of iterative reconstruction technique. COMPARISON:  None Available. FINDINGS: CT HEAD FINDINGS Brain: Atherosclerotic calcifications are present within the cavernous internal carotid arteries. No evidence of large-territorial acute infarction. No parenchymal hemorrhage. No mass lesion. No extra-axial collection. No mass effect or midline shift. No hydrocephalus. Basilar cisterns are patent. Vascular: No hyperdense vessel. Atherosclerotic calcifications are present within the cavernous internal carotid arteries. Skull: No acute fracture or focal lesion. Other: None. CT MAXILLOFACIAL FINDINGS Osseous: No fracture or mandibular dislocation. No destructive process. Sinuses/Orbits: Right maxillary sinus mucosal thickening. Paranasal sinuses and mastoid air cells are clear. The orbits are unremarkable. Soft tissues: Negative. Visualized upper cervical spine: Degenerative changes. IMPRESSION: 1. No acute intracranial abnormality. 2.  No acute displaced facial fracture. Electronically Signed   By: Morgane  Naveau M.D.   On: 08/09/2023 20:29    Assessment/Plan: Diagnosis:  Right PCA territory infarcts and right frontal lobe punctate infarcts Does the need for close, 24 hr/day medical supervision in concert with the patient's rehab needs make it unreasonable for this patient to be served in a less intensive setting? Yes Co-Morbidities requiring supervision/potential complications:  - Acute cystitis, elevated bilirubin, hypertension, GAD, tachycardia, toothache Due to bladder management, bowel management, safety, skin/wound care, disease management, medication administration, pain management, and patient education, does the patient require 24 hr/day rehab nursing? Yes Does the patient require coordinated care of a physician, rehab nurse, therapy disciplines of PT, OT, SLP to address physical and functional deficits in the context of the above medical diagnosis(es)?  Yes Addressing deficits in the following areas: balance, endurance, locomotion, strength, transferring, bowel/bladder control, bathing, dressing, feeding, grooming, toileting, cognition, speech, language, swallowing, and psychosocial support Can the patient actively participate in an intensive therapy program of at least 3 hrs of therapy per day at least 5 days per week? Yes The potential for patient to make measurable gains while on inpatient rehab is excellent Anticipated functional outcomes upon discharge from inpatient rehab are modified independent  with PT, modified independent with OT, modified independent with SLP. Estimated rehab length of stay to reach the above functional goals is: 5-7 days Anticipated discharge destination: Home Overall Rehab/Functional Prognosis: excellent  POST ACUTE RECOMMENDATIONS: This patient's condition is appropriate for continued rehabilitative care in the following setting: CIR Patient has agreed to participate in recommended program. Yes Note that insurance prior authorization may be required for reimbursement for recommended care.  Comment: Patient would be a candidate for CIR when medically stable.  Rehab coordinator to follow-up.     MEDICAL RECOMMENDATIONS: Patient appears to be having episodes of tachycardia. Possibly deconditioning.  Could consider checking orthostatic vital signs.  Neurology note from yesterday indicates irregular rhythm resembling A-fib and a rapid, narrow complex tachycardia May consider cardiology evaluation Consider Orajel for toothache   I have personally performed a face to face diagnostic evaluation of this patient. Additionally, I have examined the patient's medical record including any pertinent labs and radiographic images.    Thanks,  Lylia Sand, MD 08/11/2023

## 2023-08-11 NOTE — Assessment & Plan Note (Addendum)
 08-11-2023 on crestor  20 mg at bedtime.  Lipid panel shows  Lab Results  Component Value Date/Time   CHOL 128 08/11/2023 06:13 AM   TRIG 59 08/11/2023 06:13 AM   HDL 52 08/11/2023 06:13 AM   CHOLHDL 2.5 08/11/2023 06:13 AM   VLDL 12 08/11/2023 06:13 AM   LDLCALC 64 08/11/2023 06:13 AM   08-12-2023 stable.  08-13-2023 continue crestor  at discharge.

## 2023-08-11 NOTE — TOC CAGE-AID Note (Signed)
 Transition of Care [facility]) - CAGE-AID Screening   Patient Details  Name: Alicia Wilkerson MRN: [id number] Date of Birth: [birth year]  [facility] CM/SW Contact:    Armanie Martine E Genevieve Ritzel, LCSW Phone Number: 08/11/2023, 9:43 AM   Clinical Narrative:    CAGE-AID Screening:    Have You Ever Felt You Ought to Cut Down on Your Drinking or Drug Use?: No Have People Annoyed You By [employer] Your Drinking Or Drug Use?: No Have You Felt Bad Or Guilty About Your Drinking Or Drug Use?: No Have You Ever Had a Drink or Used Drugs First Thing In The Morning to Steady Your Nerves or to Get Rid of a Hangover?: No CAGE-AID Score: 0  Substance Abuse Education Offered: No

## 2023-08-11 NOTE — Assessment & Plan Note (Addendum)
 08-11-2023 A1C 3.7%. not consistent with diabetes.  Hyperglycemia likely due to stress from acute CVA and UTI.  08-12-2023 stable.

## 2023-08-11 NOTE — Hospital Course (Signed)
 HPI: Alicia Wilkerson is a 73 y.o. female with medical history significant for essential pretension, hyperlipidemia, who is admitted to [facility] on 08/09/2023 with acute encephalopathy after presenting from home to [facility] ED complaining of altered mental status.    In setting of the patient's altered mental status, history is provided by patient, as well as her daughter, in addition to my discussions with the EDP ED chart review.   Daughter conveys that the patient has been altered, confused over the last 1 to 2 days.  Daughter, who lives in the state of ER, first noted the patient to be confused on Saturday, 08/08/2023, with last known well occurring on Friday, 08/07/2023.  On Saturday, 08/08/2023, daughter also felt, via FaceTime evaluation, the patient was exhibiting some evidence of facial droop.   The patient reportedly experienced a ground-level fall on Saturday, 08/08/2023, in which she was leaning forward to pick something up, and tripped, resulting in her hitting her head on the floor, without any associated loss of consciousness.  She is on daily baby aspirin  as an outpatient in the absence of any additional blood thinners at home.  Additionally, the patient reportedly fell out of her bed later on Saturday, 08/08/2023, which was also associated with no loss of consciousness.   No known history of diabetes .   Given ongoing confusion exhibited by the patient, daughter arrived from [place]  earlier today, and initially brought the patient to a local urgent care, before receiving recommendation to present to the ED for further evaluation management of the above.    Significant Events: Admitted 08/09/2023 for acute metabolic encephalopathy due to UTI   Admission Labs: WBC 16.7, HgB 12.6, plt 335 Na 134, K 3.4, CO2 of 23, BUN 14, Scr 0.94, glu 309 UA negative nitrite, Large LE, rare bacteria, WBC >50  Admission Imaging Studies: CT head/face No acute intracranial abnormality. 2.  No acute  displaced facial fracture CXR No acute findings  CT head/angio Occlusion of the right ICA just distal to the bifurcation, age indeterminate, but presumably acute given patient's symptoms. Right ICA remains occluded to the terminus. Revascularization of the right MCA via collateral flow across the circle-of-Willis. 2. Fetal type origin of the right PCA with severe stenosis versus  occlusion at the origin of the right PCOM. Right PCA attenuated but patent distally. 3. Mild atheromatous change about the left carotid bulb and left carotid siphon without hemodynamically significant stenosis. 4. Small area of reticulonodular tree-in-bud densities within the left upper lobe, likely mild infection/small airways disease. 5.  Aortic Atherosclerosis  Significant Labs:   Significant Imaging Studies: MRI brain Patchy small volume acute ischemic nonhemorrhagic right PCA territory infarcts as above, with a few additional punctate infarcts within the right frontal lobe. 2. Underlying mildly advanced cerebral atrophy CT head Generalized cerebral atrophy with widening of the extra-axial spaces and ventricular dilatation. 2. The patchy small volume acute right PCA territory infarct seen on the earlier MR head is not clearly visualized on the current plain brain CT. 3. Mild left ethmoid sinus and mild sphenoid sinus mucosal thickening.  Antibiotic Therapy: Anti-infectives (From admission, onward)    Start     Dose/Rate Route Frequency Ordered Stop   08/10/23 1400  cefTRIAXone (ROCEPHIN) 1 g in sodium chloride  0.9 % 100 mL IVPB        1 g 200 mL/hr over 30 Minutes Intravenous Every 24 hours 08/09/23 2355     08/09/23 2215  cefTRIAXone (ROCEPHIN) 1 g in sodium  chloride 0.9 % 100 mL IVPB        1 g 200 mL/hr over 30 Minutes Intravenous  Once 08/09/23 2205 08/09/23 2309       Procedures:   Consultants: neurology

## 2023-08-11 NOTE — Progress Notes (Addendum)
 STROKE TEAM PROGRESS NOTE    SIGNIFICANT HOSPITAL EVENTS  She was found to have a urinary tract infection as well as multiple, embolic appearing right-sided strokes on MRI.  Right ICA occlusion with revascularization via collateral flow seen on CT angio  INTERIM HISTORY/SUBJECTIVE  Sister at bedside, thorough discussion.   On exam, patient only deficit is some decreased fine motor movements on her left hand.  No confusion, no dysarthria or aphasia, no focal weakness or sensation deficits.  Aspirin  and Plavix recommended for 3 months due to intracranial ICA occlusion then Plavix alone. LDL and A1c within goal.   Echo shows EF of 60 to 65%, moderately dilated left atria.  Recommend 30 day monitor prior to discharge for further evaluation for atrial fibrillation.  PENDING Carotid US   OBJECTIVE  CBC    Component Value Date/Time   WBC 11.6 (H) 08/11/2023 0613   RBC 3.48 (L) 08/11/2023 0613   HGB 12.0 08/11/2023 0613   HGB 13.5 08/21/2016 0939   HGB 13.3 04/28/2014 1049   HCT 34.0 (L) 08/11/2023 0613   HCT 39.5 08/21/2016 0939   PLT 346 08/11/2023 0613   PLT 360 08/21/2016 0939   MCV 97.7 08/11/2023 0613   MCV 97 08/21/2016 0939   MCH 34.5 (H) 08/11/2023 0613   MCHC 35.3 08/11/2023 0613   RDW 16.1 (H) 08/11/2023 0613   RDW 15.4 08/21/2016 0939   LYMPHSABS 1.5 08/10/2023 0542   MONOABS 1.8 (H) 08/10/2023 0542   EOSABS 0.0 08/10/2023 0542   BASOSABS 0.1 08/10/2023 0542    BMET    Component Value Date/Time   NA 135 08/11/2023 0613   NA 139 08/21/2016 0939   K 3.6 08/11/2023 0613   CL 101 08/11/2023 0613   CO2 25 08/11/2023 0613   GLUCOSE 106 (H) 08/11/2023 0613   BUN 9 08/11/2023 0613   BUN 17 08/21/2016 0939   CREATININE 0.68 08/11/2023 0613   CREATININE 0.71 11/04/2019 0955   CALCIUM  8.7 (L) 08/11/2023 0613   CALCIUM  9.7 06/17/2012 0906   GFRNONAA >60 08/11/2023 0613    IMAGING past 24 hours CT HEAD WO CONTRAST ( ) Result Date: 08/10/2023 CLINICAL DATA:   Headache. EXAM: CT HEAD WITHOUT CONTRAST TECHNIQUE: Contiguous axial images were obtained from the base of the skull through the vertex without intravenous contrast. RADIATION DOSE REDUCTION: This exam was performed according to the departmental dose-optimization program which includes automated exposure control, adjustment of the mA and/or kV according to patient size and/or use of iterative reconstruction technique. COMPARISON:  August 09, 2023, MR head dated August 10, 2023 FINDINGS: Brain: There is generalized cerebral atrophy with widening of the extra-axial spaces and ventricular dilatation. There are areas of decreased attenuation within the white matter tracts of the supratentorial brain, consistent with microvascular disease changes. The patchy small volume acute right PCA territory infarct seen on the earlier MR head (August 10, 2023) is not clearly visualized on the current plain brain CT. Vascular: Marked severity bilateral cavernous carotid artery calcification is noted. Skull: Normal. Negative for fracture or focal lesion. Sinuses/Orbits: There is mild left ethmoid sinus and mild sphenoid sinus mucosal thickening. Other: None. IMPRESSION: 1. Generalized cerebral atrophy with widening of the extra-axial spaces and ventricular dilatation. 2. The patchy small volume acute right PCA territory infarct seen on the earlier MR head is not clearly visualized on the current plain brain CT. 3. Mild left ethmoid sinus and mild sphenoid sinus mucosal thickening. Electronically Signed   By: Elizabeth Gulling.D.  On: 08/10/2023 18:28    Vitals:   08/10/23 2309 08/11/23 0310 08/11/23 0500 08/11/23 0718  BP: (!) 147/70 (!) 141/73  129/79  Pulse: 62   (!) 56  Resp: 12 16  13   Temp: 98 F (36.7 C) 97.8 F (36.6 C)  98.2 F (36.8 C)  TempSrc: Oral Axillary  Oral  SpO2: 96%   95%  Weight:   56.1 kg   Height:         PHYSICAL EXAM General:  Alert, well-nourished, well-developed patient in no acute  distress CV: Paced on monitor. ST on EKG this admission Respiratory:  Regular, unlabored respirations on room air   NEURO:  Mental Status: AA&Ox3, patient is able to give clear and coherent history Speech/Language: speech is without dysarthria or aphasia.  Naming, repetition, fluency, and comprehension intact.  Cranial Nerves:  II: PERRL. Visual fields full.  III, IV, VI: EOMI. Eyelids elevate symmetrically.  V: Sensation is intact to light touch and symmetrical to face.  VII: Face is symmetrical resting and smiling VIII: hearing intact to voice. IX, X: Palate elevates symmetrically. Phonation is normal.  IO:[id number] shrug 5/5. XII: tongue is midline without fasciculations. Motor: 5/5 strength to all muscle groups tested.  Tone: is normal and bulk is normal Sensation- Intact to light touch bilaterally. Extinction absent to light touch to DSS.   Coordination: FTN intact bilaterally.  Decreased fine motor movements with orbiting seen on the left Gait- deferred  Most Recent NIH: 0     ASSESSMENT/PLAN  [name] is a 73 y.o. female with history of hypertension and hyperlipidemia who originally presented with altered mental status and was found to have a urinary tract infection.  MRI brain was performed, revealing multiple embolic appearing infarcts in the right MCA and PCA territories.  NIH on Admission: 0.  Multiple acute ischemic infarcts right MCA/PCA territories Etiology:failure of collaterals in chronic occlusion versus embolic, pending full stroke workup. CT Head without contrast(Personally reviewed): No acute abnormality CT angio Head and Neck with contrast(Personally reviewed): Occlusion of the right ICA just distal to the bifurcation, age-indeterminate, revascularization of right MCA via collateral flow across the circle of Willis, fetal type right PCA with stenting of your stenosis versus occlusion and origin of right P-comm, mild atheromatous change about the  left carotid bulb MRI Brain(Personally reviewed): Multiple, embolic appearing infarcts in the right cerebral hemisphere, and right MCA and PCA territory, mild atrophy Carotid Doppler: PENDING 2D Echo: EF of 60 to 65%, moderately dilated left atria. 30 day heart monitor LDL 64 HgbA1c 3.7 VTE prophylaxis - lovenox  aspirin  81 mg daily prior to admission, now on aspirin  81 mg daily and clopidogrel 75 mg daily for 3 months and then Plavix alone. If Atrial Fibrillation is diagnosed, would recommend starting OAC, likely eliquis instead.  Therapy recommendations:  CIR Disposition:  pending  Hypertension Home meds: Losartan  25 mg daily Stable Blood Pressure Goal: BP less than 220/110 for 24 hours after symptom onset, then gradually normalize Avoid hypotension due to intracranial stenosis  Hyperlipidemia Home meds: Crestor  20 mg daily, resumed in hospital LDL 64, goal < 70 Continue statin at discharge  Other Stroke Risk Factors Coronary artery disease  Other Active Problems UTI Currently on Rocephin Hx of Anxiety/Depression  Hospital day # 1   Pt seen by Neuro NP/APP with MD. Note/plan to be edited by MD as needed.    Audrene Lease, DNP [facility] Please use AMION for contact information & EPIC for messaging.  I have personally obtained history,examined this patient, reviewed notes, independently viewed imaging studies, participated in medical decision making and plan of care.ROS completed by me personally and pertinent positives fully documented  I have made any additions or clarifications directly to the above note. Agree with note above.  Patient presented with several day history of confusion, encephalopathy falls and a UTI.  MRI scan shows patchy right PCA small infarcts with CT angiogram showing what looks like chronic right ICA occlusion.  Recommend dual antiplatelet therapy aspirin  Plavix for 3 months followed by Plavix alone.  Aggressive risk factor modification.   30-day heart monitor at discharge to look for paroxysmal A-fib.  Mobilize out of bed.  Therapy consults.  Long discussion with patient and sister at the bedside and answered questions.  Greater than 50% time during this 50-minute visit was spent in counseling and coordination of care about her strokes and carotid occlusion and discussion about stroke evaluation and prevention and treatment and answering questions.  Ardella Beaver, MD Medical Director [facility] Pager: [phone] 08/11/2023 4:57 PM  To contact Stroke Continuity provider, please refer to [email or web address]. After hours, contact General Neurology

## 2023-08-11 NOTE — Progress Notes (Signed)
 Physical Therapy Treatment Patient Details Name: Alicia Wilkerson MRN: [id number] DOB: 08/31/1950 Today's Date: 08/11/2023   History of Present Illness Patient is a 73 y.o. female presenting 08/09/23 with AMS and multiple recent falls. MRI brain with patchy small volume acute right PCA territory infarcts, with a few additional punctate infarcts within the right frontal lobe. Additional work up showing UTI.  PMH significant for HTN, HLD, osteoporosis, cervical intraepithelial neoplasia II.    PT Comments  The pt was agreeable to mobility session with focus on dynamic stability and balance assessment, asking to use bathroom upon my arrival. The pt demos increased sway with turning and managing in restroom but no overt LOB. However, upon return to room, pt with HR increase to 182bpm with static stance, the pt was then asked to return to sitting EOB while RN working to get EKG. Pt's HR sustained 150s while sitting EOB, pt asymptomatic and asking to continue mobility. Pts HR had moments of recovery to 60s or 80s while supine in bed but did jump again and sustain in 130s when supine (RN present for this and captured EKG). Further balance challenge and exertion held at this time. PT will continue to follow and mobilize as directed.    If plan is discharge home, recommend the following: A lot of help with walking and/or transfers;A little help with bathing/dressing/bathroom;Assistance with cooking/housework;Assist for transportation;Help with stairs or ramp for entrance;Direct supervision/assist for medications management;Supervision due to cognitive status   Can travel by private vehicle        Equipment Recommendations   (TBA/defer to next venue)    Recommendations for Other Services Rehab consult     Precautions / Restrictions Precautions Precautions: Fall Recall of Precautions/Restrictions: Impaired Precaution/Restrictions Comments: watch HR elevation, max 182bpm Restrictions Weight Bearing  Restrictions Per Provider Order: No     Mobility  Bed Mobility Overal bed mobility: Needs Assistance Bed Mobility: Supine to Sit, Sit to Supine     Supine to sit: Contact guard, HOB elevated Sit to supine: Contact guard assist   General bed mobility comments: cues for safety and lines, no assist    Transfers Overall transfer level: Needs assistance Equipment used: None Transfers: Sit to/from Stand, Bed to chair/wheelchair/BSC Sit to Stand: Contact guard assist   Step pivot transfers: Contact guard assist, Min assist       General transfer comment: minA to steady with turning to toilet, but pt using UE well to safely lower and rise from toilet    Ambulation/Gait Ambulation/Gait assistance: Min assist Gait Distance (Feet): 15 Feet Assistive device: None Gait Pattern/deviations: Step-through pattern, Decreased stride length, Drifts right/left Gait velocity: decr Gait velocity interpretation: <1.31 ft/sec, indicative of household ambulator   General Gait Details: limited to short distance ambulation due to HR elevation first to 140bpm then to 182bpm with pt standing in room after using bathroom. the pt was then returned to bed so RN could get EKG. further mobility held, not able to further assess/challenge balance.      Balance Overall balance assessment: Needs assistance Sitting-balance support: Feet supported Sitting balance-Leahy Scale: Good     Standing balance support: During functional activity, Single extremity supported, No upper extremity supported Standing balance-Leahy Scale: Poor Standing balance comment: not able to fully assess, increased sway with direction changes in bathroom and with turning to flush.                            Communication  Communication Communication: Impaired Factors Affecting Communication: Reduced clarity of speech  Cognition Arousal: Alert Behavior During Therapy: WFL for tasks assessed/performed   PT - Cognitive  impairments: Awareness, Attention, Problem solving, Safety/Judgement, Memory                       PT - Cognition Comments: pt with decreased insight and awareness. fixated on fitbit HR of 82bpm while PT explaining tele HR monitor sustaining 182-150. Pt then asking if we should continue mobilizing multiple times despite attempts to explain importance of letting HR return to baseline. family present and understanding. Following commands: Intact      Cueing Cueing Techniques: Verbal cues    PT Goals (current goals can now be found in the care plan section) Acute Rehab PT Goals Patient Stated Goal: recover independence PT Goal Formulation: With patient Time For Goal Achievement: 08/24/23 Potential to Achieve Goals: Good Progress towards PT goals: Not progressing toward goals - comment (limited by HR elevation)    Frequency    Min 3X/week       AM-PAC PT 6 Clicks Mobility   Outcome Measure  Help needed turning from your back to your side while in a flat bed without using bedrails?: A Little Help needed moving from lying on your back to sitting on the side of a flat bed without using bedrails?: A Little Help needed moving to and from a bed to a chair (including a wheelchair)?: A Little Help needed standing up from a chair using your arms (e.g., wheelchair or bedside chair)?: A Little Help needed to walk in hospital room?: A Lot Help needed climbing 3-5 steps with a railing? : A Lot 6 Click Score: 16    End of Session Equipment Utilized During Treatment: Gait belt Activity Tolerance: Treatment limited secondary to medical complications (Comment) (HR to 182bpm with static stance) Patient left: in bed;with call bell/phone within reach;with family/visitor present;with nursing/sitter in room Nurse Communication: Mobility status PT Visit Diagnosis: Muscle weakness (generalized) (M62.81);Difficulty in walking, not elsewhere classified (R26.2);Other symptoms and signs  involving the nervous system (R29.898)     Time: 0981-1914 PT Time Calculation (min) (ACUTE ONLY): 25 min  Charges:    $Therapeutic Activity: 23-37 mins PT General Charges $$ ACUTE PT VISIT: 1 Visit                     Barnabas Booth, PT, DPT   Acute Rehabilitation Department Office [phone] Secure Chat Communication Preferred   Lona Rist 08/11/2023, 12:31 PM

## 2023-08-11 NOTE — Progress Notes (Signed)
 Carotid duplex has been completed.   Results can be found under chart review under CV PROC. 08/11/2023 3:19 PM Jaymin Waln RVT, RDMS

## 2023-08-11 NOTE — Assessment & Plan Note (Addendum)
 08-11-2023 present on admission. WBC 16K, HR 138. + UA for infection. Now resolved.

## 2023-08-11 NOTE — Assessment & Plan Note (Addendum)
 08-11-2023 MRI brain positive for ischemic stroke. Echo LVEF 65%. No shunts. No thrombus.  Pt recommended CIR by PT/OT.  CIR has seen patient. Awaiting ins authorization. Discussed with pt and her sister that family may have to stay with her at her home after discharge from CIR or pt may need to go and stay with relatives after CIR.  On crestor  20 mg daily.  Lovenox  for DVT prophylaxis. On ASA and plavix.  08-12-2023 awaiting ins authorization.  08-13-2023 DC recs by neurology:  30 day heart monitor recommended at discharge aspirin  81 mg daily and clopidogrel 75 mg daily for 3 months and then Plavix alone.  If Atrial Fibrillation is diagnosed, would recommend starting OAC, likely eliquis instead.  Continue statin at discharge

## 2023-08-11 NOTE — Assessment & Plan Note (Addendum)
 08-11-2023 pt had episode of PSVT in my presence. HR 120s. Narrow complex tachycardia. I had pt performed Valsalva maneuver. PSVT terminated within about 3 secs.  Will give some po kcl and get her serum K above 4.5. also some mag oxide to get her serum Mg above 2.0. repeat Mg and BMP in AM.  Cannot start betablockers due to resting HR in the high 50s and low 60s. SBP in the upper 90s.  08-12-2023 stable. Resting HR in high 50 - low 60s. Unable to start any AVN blockers.  08-13-2023 continue with Mg and Kcl supplementation to keep serum Mg >2.0 and K >4.0. will change mag oxide to 400 mg qday due to diarrhea she has had with BID dosing.. continue with kcl 20 meq daily.

## 2023-08-11 NOTE — Assessment & Plan Note (Addendum)
 08-11-2023 stable. On ASA, crestor .  08-12-2023 stable.   08-13-2023 continue ASA and crestor  at discharge.

## 2023-08-11 NOTE — Progress Notes (Signed)
 PROGRESS NOTE    Alicia Wilkerson  NFA:[id number] DOB: August 03, 1950 DOA: 08/09/2023 PCP: Colene Dauphin, MD  Subjective: Pt seen and examined. Met with pt and her sister [name] at bedside. Sister lives in [place], [place] .  Pt does not have any close family that [place]. Pt being evaluated by CIR.  During my visit, pt had a bout of SVT that aborted with Valsalva maneuver after about 3 seconds.  Pt states she has had these before and she usually will feel sweaty and weak.   Hospital Course: HPI: Alicia Wilkerson is a 73 y.o. female with medical history significant for essential pretension, hyperlipidemia, who is admitted to [facility] on 08/09/2023 with acute encephalopathy after presenting from home to [facility] ED complaining of altered mental status.    In setting of the patient's altered mental status, history is provided by patient, as well as her daughter, in addition to my discussions with the EDP ED chart review.   Daughter conveys that the patient has been altered, confused over the last 1 to 2 days.  Daughter, who lives in the state [place], first noted the patient to be confused on Saturday, 08/08/2023, with last known well occurring on Friday, 08/07/2023.  On Saturday, 08/08/2023, daughter also felt, via FaceTime evaluation, the patient was exhibiting some evidence of facial droop.   The patient reportedly experienced a ground-level fall on Saturday, 08/08/2023, in which she was leaning forward to pick something up, and tripped, resulting in her hitting her head on the floor, without any associated loss of consciousness.  She is on daily baby aspirin  as an outpatient in the absence of any additional blood thinners at home.  Additionally, the patient reportedly fell out of her bed later on Saturday, 08/08/2023, which was also associated with no loss of consciousness.   No known history of diabetes .   Given ongoing confusion exhibited by the patient, daughter arrived from [place]   earlier today, and initially brought the patient to a local urgent care, before receiving recommendation to present to the ED for further evaluation management of the above.    Significant Events: Admitted 08/09/2023 for acute metabolic encephalopathy due to UTI   Admission Labs: WBC 16.7, HgB 12.6, plt 335 Na 134, K 3.4, CO2 of 23, BUN 14, Scr 0.94, glu 309 UA negative nitrite, Large LE, rare bacteria, WBC >50  Admission Imaging Studies: CT head/face No acute intracranial abnormality. 2.  No acute displaced facial fracture CXR No acute findings  CT head/angio Occlusion of the right ICA just distal to the bifurcation, age indeterminate, but presumably acute given patient's symptoms. Right ICA remains occluded to the terminus. Revascularization of the right MCA via collateral flow across the circle-of-Willis. 2. Fetal type origin of the right PCA with severe stenosis versus  occlusion at the origin of the right PCOM. Right PCA attenuated but patent distally. 3. Mild atheromatous change about the left carotid bulb and left carotid siphon without hemodynamically significant stenosis. 4. Small area of reticulonodular tree-in-bud densities within the left upper lobe, likely mild infection/small airways disease. 5.  Aortic Atherosclerosis  Significant Labs:   Significant Imaging Studies: MRI brain Patchy small volume acute ischemic nonhemorrhagic right PCA territory infarcts as above, with a few additional punctate infarcts within the right frontal lobe. 2. Underlying mildly advanced cerebral atrophy CT head Generalized cerebral atrophy with widening of the extra-axial spaces and ventricular dilatation. 2. The patchy small volume acute right PCA territory infarct seen on the  earlier MR head is not clearly visualized on the current plain brain CT. 3. Mild left ethmoid sinus and mild sphenoid sinus mucosal thickening.  Antibiotic Therapy: Anti-infectives (From admission, onward)    Start      Dose/Rate Route Frequency Ordered Stop   08/10/23 1400  cefTRIAXone (ROCEPHIN) 1 g in sodium chloride  0.9 % 100 mL IVPB        1 g 200 mL/hr over 30 Minutes Intravenous Every 24 hours 08/09/23 2355     08/09/23 2215  cefTRIAXone (ROCEPHIN) 1 g in sodium chloride  0.9 % 100 mL IVPB        1 g 200 mL/hr over 30 Minutes Intravenous  Once 08/09/23 2205 08/09/23 2309       Procedures:   Consultants: neurology    Assessment and Plan: * Acute ischemic stroke (HCC) 08-11-2023 MRI brain positive for ischemic stroke. Echo LVEF 65%. No shunts. No thrombus.  Pt recommended CIR by PT/OT.  CIR has seen patient. Awaiting ins authorization. Discussed with pt and her sister that family may have to stay with her at her home after discharge from CIR or pt may need to go and stay with relatives after CIR.  On crestor  20 mg daily.  Lovenox  for DVT prophylaxis. On ASA and plavix.  Acute cystitis 08-11-2023 urine cx growing less than 10K CFU. She will complete 3 days of IV rocephin. Today is Day #2.   PSVT (paroxysmal supraventricular tachycardia) (HCC) 08-11-2023 pt had episode of PSVT in my presence. HR 120s. Narrow complex tachycardia. I had pt performed Valsalva maneuver. PSVT terminated within about 3 secs.  Will give some po kcl and get her serum K above 4.5. also some mag oxide to get her serum Mg above 2.0. repeat Mg and BMP in AM.  Cannot start betablockers due to resting HR in the high 50s and low 60s. SBP in the upper 90s.  Severe sepsis (HCC) 08-11-2023 present on admission. WBC 16K, HR 138. + UA for infection. Now resolved.  Hyperglycemia 08-11-2023 A1C 3.7%. not consistent with diabetes.  Hyperglycemia likely due to stress from acute CVA and UTI.  Hypokalemia-resolved as of 08/11/2023 08-11-2023 treated on admission. Now resolved.  CAD (coronary artery disease) 08-11-2023 stable. On ASA, crestor .  HLD (hyperlipidemia) 08-11-2023 on crestor  20 mg at bedtime.  Lipid panel shows  Lab  Results  Component Value Date/Time   CHOL 128 08/11/2023 06:13 AM   TRIG 59 08/11/2023 06:13 AM   HDL 52 08/11/2023 06:13 AM   CHOLHDL 2.5 08/11/2023 06:13 AM   VLDL 12 08/11/2023 06:13 AM   LDLCALC 64 08/11/2023 06:13 AM     DVT prophylaxis: enoxaparin  (LOVENOX ) injection 40 mg Start: 08/10/23 2200 SCDs Start: 08/09/23 2351    Code Status: Full Code Family Communication: discussed with pt and sister [name] at bedside Disposition Plan: CIR vs home vs SNF Reason for continuing need for hospitalization:  medically stable. Awaiting ins authorization for CIR.  Objective: Vitals:   08/11/23 0500 08/11/23 0718 08/11/23 1204 08/11/23 1533  BP:  129/79 (!) 122/57 135/64  Pulse:  (!) 56 61 (!) 59  Resp:  13 20 20   Temp:  98.2 F (36.8 C) 98 F (36.7 C) 98.1 F (36.7 C)  TempSrc:  Oral Oral Oral  SpO2:  95% 95% 95%  Weight: 56.1 kg     Height:        Intake/Output Summary (Last 24 hours) at 08/11/2023 1717 Last data filed at 08/11/2023 1000 Gross per 24 hour  Intake 240 ml  Output --  Net 240 ml   Filed Weights   08/09/23 1745 08/11/23 0500  Weight: 55.3 kg 56.1 kg    Examination:  Physical Exam Vitals and nursing note reviewed.  Constitutional:      General: She is not in acute distress.    Appearance: She is normal weight. She is not toxic-appearing or diaphoretic.  HENT:     Head: Normocephalic and atraumatic.     Nose: Nose normal.   Eyes:     General: No scleral icterus.   Cardiovascular:     Rate and Rhythm: Normal rate and regular rhythm.  Pulmonary:     Effort: Pulmonary effort is normal.     Breath sounds: Normal breath sounds.  Abdominal:     General: Abdomen is flat. There is no distension.     Palpations: Abdomen is soft.   Musculoskeletal:     Right lower leg: No edema.     Left lower leg: No edema.   Skin:    General: Skin is warm and dry.     Capillary Refill: Capillary refill takes less than 2 seconds.   Neurological:     General:  No focal deficit present.     Mental Status: She is alert and oriented to person, place, and time.     Data Reviewed: I have personally reviewed following labs and imaging studies  CBC: Recent Labs  Lab 08/09/23 1747 08/09/23 1801 08/10/23 0542 08/11/23 0613  WBC 16.7*  --  12.8* 11.6*  NEUTROABS 14.1*  --  9.3*  --   HGB 12.6 11.6* 12.0 12.0  HCT 36.2 34.0* 33.5* 34.0*  MCV 100.0  --  97.1 97.7  PLT 335  --  300 346   Basic Metabolic Panel: Recent Labs  Lab 08/09/23 1747 08/09/23 1801 08/10/23 0542 08/11/23 0613  NA 134* 135 137 135  K 3.4* 3.8 4.4 3.6  CL 98 97* 102 101  CO2 23  --  26 25  GLUCOSE 309* 300* 105* 106*  BUN 14 16 10 9   CREATININE 0.94 0.80 0.63 0.68  CALCIUM  9.7  --  9.0 8.7*  MG  --   --  1.9  --    GFR: Estimated Creatinine Clearance: 53.4 mL/min (by C-G formula based on SCr of 0.68 mg/dL). Liver Function Tests: Recent Labs  Lab 08/09/23 1747 08/10/23 0542 08/10/23 1004  AST 24 18  --   ALT 17 16  --   ALKPHOS 58 45  --   BILITOT 1.6* 1.6* 1.5*  PROT 6.9 6.0*  --   ALBUMIN 3.5 3.0*  --    Coagulation Profile: Recent Labs  Lab 08/09/23 1747  INR 1.0   HbA1C: Recent Labs    08/10/23 1004  HGBA1C 3.7*   CBG: Recent Labs  Lab 08/09/23 1851 08/10/23 0937 08/10/23 1204  GLUCAP 205* 108* 96   Lipid Profile: Recent Labs    08/11/23 0613  CHOL 128  HDL 52  LDLCALC 64  TRIG 59  CHOLHDL 2.5   Thyroid  Function Tests: Recent Labs    08/10/23 1004  TSH 1.594   Anemia Panel: Recent Labs    08/10/23 1004  VITAMINB12 271   Sepsis Labs: Recent Labs  Lab 08/10/23 1026  LATICACIDVEN 0.9    Recent Results (from the past 240 hours)  Culture, OB Urine     Status: Abnormal   Collection Time: 08/09/23  9:00 PM   Specimen: Urine, Random  Result Value  Ref Range Status   Specimen Description URINE, RANDOM  Final   Special Requests NONE  Final   Culture (A)  Final    <10,000 COLONIES/mL INSIGNIFICANT GROWTH NO GROUP  B STREP (S.AGALACTIAE) ISOLATED Performed at [facility], [address] [place], [place] [address]    Report Status 08/11/2023 FINAL  Final  Culture, blood (Routine X 2) w Reflex to ID Panel     Status: None (Preliminary result)   Collection Time: 08/10/23  5:52 AM   Specimen: BLOOD  Result Value Ref Range Status   Specimen Description BLOOD LEFT ANTECUBITAL  Final   Special Requests   Final    BOTTLES DRAWN AEROBIC ONLY Blood Culture results may not be optimal due to an inadequate volume of blood received in culture bottles   Culture   Final    NO GROWTH < 24 HOURS Performed at [facility], [address] [place], [place] [address]    Report Status PENDING  Incomplete  Culture, blood (Routine X 2) w Reflex to ID Panel     Status: None (Preliminary result)   Collection Time: 08/10/23  5:57 AM   Specimen: BLOOD  Result Value Ref Range Status   Specimen Description BLOOD LEFT ANTECUBITAL  Final   Special Requests   Final    BOTTLES DRAWN AEROBIC ONLY Blood Culture results may not be optimal due to an inadequate volume of blood received in culture bottles   Culture   Final    NO GROWTH < 24 HOURS Performed at [facility], [address], [place], [place] [address]    Report Status PENDING  Incomplete     Radiology Studies: VAS US  CAROTID Result Date: 08/11/2023 Carotid Arterial Duplex Study Patient Name:  ROIZY HAROLD  Date of Exam:   08/11/2023 Medical Rec #: [id number]        Accession #:    [id number] Date of Birth: 20-Nov-1950        Patient Gender: F Patient Age:   50 years Exam Location:  [facility] Procedure:      VAS US  CAROTID Referring Phys: Roxan Copes --------------------------------------------------------------------------------  Risk Factors: Hypertension, hyperlipidemia, coronary artery disease. Performing Technologist: Arlyce Berger RVT, RDMS  Examination Guidelines: A complete evaluation includes B-mode imaging, spectral Doppler, color  Doppler, and power Doppler as needed of all accessible portions of each vessel. Bilateral testing is considered an integral part of a complete examination. Limited examinations for reoccurring indications may be performed as noted.  Right Carotid Findings: +----------+--------+--------+--------+------------------+--------+           PSV cm/sEDV cm/sStenosisPlaque DescriptionComments +----------+--------+--------+--------+------------------+--------+ CCA Prox  71      0                                          +----------+--------+--------+--------+------------------+--------+ CCA Distal51      0                                          +----------+--------+--------+--------+------------------+--------+ ICA Prox                  Occluded                           +----------+--------+--------+--------+------------------+--------+  ICA Distal                Occluded                           +----------+--------+--------+--------+------------------+--------+ ECA       144     3                                          +----------+--------+--------+--------+------------------+--------+ +----------+--------+-------+----------------+-------------------+           PSV cm/sEDV cmsDescribe        Arm Pressure (mmHG) +----------+--------+-------+----------------+-------------------+ [id number]            Multiphasic, WNL                    +----------+--------+-------+----------------+-------------------+ +---------+--------+--+--------+--+---------+ VertebralPSV cm/s58EDV cm/s15Antegrade +---------+--------+--+--------+--+---------+  Left Carotid Findings: +----------+--------+--------+--------+------------------+------------------+           PSV cm/sEDV cm/sStenosisPlaque DescriptionComments           +----------+--------+--------+--------+------------------+------------------+ CCA Prox  114     23                                intimal thickening  +----------+--------+--------+--------+------------------+------------------+ CCA Distal78      16                                intimal thickening +----------+--------+--------+--------+------------------+------------------+ ICA Prox  85      19              focal and calcific                   +----------+--------+--------+--------+------------------+------------------+ ICA Distal77      23                                                   +----------+--------+--------+--------+------------------+------------------+ ECA       92      0                                                    +----------+--------+--------+--------+------------------+------------------+ +----------+--------+--------+----------------+-------------------+           PSV cm/sEDV cm/sDescribe        Arm Pressure (mmHG) +----------+--------+--------+----------------+-------------------+ [id number]             Multiphasic, WNL                    +----------+--------+--------+----------------+-------------------+ +---------+--------+--+--------+-+---------+ VertebralPSV cm/s62EDV cm/s6Antegrade +---------+--------+--+--------+-+---------+   Summary: Right Carotid: Evidence consistent with a total occlusion of the right ICA. Left Carotid: The extracranial vessels were near-normal with only minimal wall               thickening or plaque. Vertebrals:  Bilateral vertebral arteries demonstrate antegrade flow. Subclavians: Normal flow hemodynamics were seen in bilateral subclavian              arteries. *See table(s) above for measurements and observations.  Electronically signed by  Runell Countryman on 08/11/2023 at 4:32:35 PM.    Final    ECHOCARDIOGRAM COMPLETE Result Date: 08/11/2023    ECHOCARDIOGRAM REPORT   Patient Name:   ANDRE GALLEGO Date of Exam: 08/11/2023 Medical Rec #:  [id number]       Height:       63.0 in Accession #:    [id number]      Weight:       123.7 lb Date of Birth:  Oct 21, 1950        BSA:          1.576 m Patient Age:    73 years        BP:           129/79 mmHg Patient Gender: F               HR:           60 bpm. Exam Location:  Inpatient Procedure: 2D Echo, Cardiac Doppler and Color Doppler (Both Spectral and Color            Flow Doppler were utilized during procedure). Indications:    CVA  History:        Patient has prior history of Echocardiogram examinations, most                 recent 11/14/2018.  Sonographer:    Griselda Lederer Referring Phys: [id number] CORTNEY E DE LA TORRE IMPRESSIONS  1. Left ventricular ejection fraction, by estimation, is 60 to 65%. The left ventricle has normal function. The left ventricle has no regional wall motion abnormalities. There is mild left ventricular hypertrophy. Left ventricular diastolic parameters are consistent with Grade I diastolic dysfunction (impaired relaxation). Consider evaluation for infiltrative cardiomyopathy. (I.E HCM, amyloid)  2. Right ventricular systolic function is normal. The right ventricular size is normal. Mildly increased right ventricular wall thickness.  3. Left atrial size was moderately dilated.  4. Small to moderate sized pericardial effusion. No signs of tamponade.  5. The mitral valve is normal in structure. No evidence of mitral valve regurgitation. No evidence of mitral stenosis.  6. The aortic valve is normal in structure. Aortic valve regurgitation is not visualized. No aortic stenosis is present.  7. The inferior vena cava is normal in size with greater than 50% respiratory variability, suggesting right atrial pressure of 3 mmHg. FINDINGS  Left Ventricle: Left ventricular ejection fraction, by estimation, is 60 to 65%. The left ventricle has normal function. The left ventricle has no regional wall motion abnormalities. The left ventricular internal cavity size was normal in size. There is  mild left ventricular hypertrophy. Left ventricular diastolic parameters are consistent with Grade I diastolic dysfunction  (impaired relaxation). Right Ventricle: The right ventricular size is normal. Mildly increased right ventricular wall thickness. Right ventricular systolic function is normal. Left Atrium: Left atrial size was moderately dilated. Right Atrium: Right atrial size was normal in size. Pericardium: A moderately sized pericardial effusion is present. Mitral Valve: The mitral valve is normal in structure. No evidence of mitral valve regurgitation. No evidence of mitral valve stenosis. Tricuspid Valve: The tricuspid valve is normal in structure. Tricuspid valve regurgitation is not demonstrated. No evidence of tricuspid stenosis. Aortic Valve: The aortic valve is normal in structure. There is mild aortic valve annular calcification. Aortic valve regurgitation is not visualized. No aortic stenosis is present. Pulmonic Valve: The pulmonic valve was normal in structure. Pulmonic valve regurgitation is not visualized. No evidence of pulmonic stenosis. Aorta: The aortic  root is normal in size and structure. Venous: The inferior vena cava is normal in size with greater than 50% respiratory variability, suggesting right atrial pressure of 3 mmHg. IAS/Shunts: No atrial level shunt detected by color flow Doppler.  LEFT VENTRICLE PLAX 2D LVIDd:         3.00 cm     Diastology LVIDs:         1.70 cm     LV e' medial:    4.35 cm/s LV PW:         1.10 cm     LV E/e' medial:  16.3 LV IVS:        1.30 cm     LV e' lateral:   5.55 cm/s LVOT diam:     1.90 cm     LV E/e' lateral: 12.8 LVOT Area:     2.84 cm  LV Volumes (MOD) LV vol d, MOD A2C: 38.2 ml LV vol d, MOD A4C: 37.5 ml LV vol s, MOD A2C: 13.2 ml LV vol s, MOD A4C: 12.0 ml LV SV MOD A2C:     25.0 ml LV SV MOD A4C:     37.5 ml LV SV MOD BP:      25.0 ml RIGHT VENTRICLE             IVC RV Basal diam:  2.80 cm     IVC diam: 0.90 cm RV S prime:     25.60 cm/s TAPSE (M-mode): 1.4 cm LEFT ATRIUM           Index LA diam:      3.90 cm 2.47 cm/m LA Vol (A4C): 65.8 ml 41.74 ml/m   AORTA Ao  Root diam: 3.00 cm Ao Asc diam:  3.20 cm MITRAL VALVE               TRICUSPID VALVE MV Area (PHT): 2.90 cm    TR Peak grad:   19.5 mmHg MV Decel Time: 262 msec    TR Vmax:        221.00 cm/s MV E velocity: 71.10 cm/s MV A velocity: 71.80 cm/s  SHUNTS MV E/A ratio:  0.99        Systemic Diam: 1.90 cm Aditya Sabharwal Electronically signed by Alwin Baars Signature Date/Time: 08/11/2023/9:19:13 AM    Final    CT HEAD WO CONTRAST ( ) Result Date: 08/10/2023 CLINICAL DATA:  Headache. EXAM: CT HEAD WITHOUT CONTRAST TECHNIQUE: Contiguous axial images were obtained from the base of the skull through the vertex without intravenous contrast. RADIATION DOSE REDUCTION: This exam was performed according to the departmental dose-optimization program which includes automated exposure control, adjustment of the mA and/or kV according to patient size and/or use of iterative reconstruction technique. COMPARISON:  August 09, 2023, MR head dated August 10, 2023 FINDINGS: Brain: There is generalized cerebral atrophy with widening of the extra-axial spaces and ventricular dilatation. There are areas of decreased attenuation within the white matter tracts of the supratentorial brain, consistent with microvascular disease changes. The patchy small volume acute right PCA territory infarct seen on the earlier MR head (August 10, 2023) is not clearly visualized on the current plain brain CT. Vascular: Marked severity bilateral cavernous carotid artery calcification is noted. Skull: Normal. Negative for fracture or focal lesion. Sinuses/Orbits: There is mild left ethmoid sinus and mild sphenoid sinus mucosal thickening. Other: None. IMPRESSION: 1. Generalized cerebral atrophy with widening of the extra-axial spaces and ventricular dilatation. 2. The patchy small volume acute right PCA territory infarct  seen on the earlier MR head is not clearly visualized on the current plain brain CT. 3. Mild left ethmoid sinus and mild sphenoid sinus  mucosal thickening. Electronically Signed   By: Virgle Grime M.D.   On: 08/10/2023 18:28   MR BRAIN WO CONTRAST Result Date: 08/10/2023 CLINICAL DATA:  Initial evaluation for acute neuro deficit, stroke suspected. EXAM: MRI HEAD WITHOUT CONTRAST TECHNIQUE: Multiplanar, multiecho pulse sequences of the brain and surrounding structures were obtained without intravenous contrast. COMPARISON:  CTs from 08/09/2023 FINDINGS: Brain: Mildly advanced cerebral atrophy. No significant cerebral white matter disease for age. 1.2 cm acute ischemic nonhemorrhagic infarct present at the ventral right thalamic capsular region (series 5, image 75). Patchy small volume acute ischemic nonhemorrhagic infarcts at the right occipital lobe, right PCA distribution (series 5, images 68, 70, 73). Mild patchy involvement of the mesial right temporal lobe/right hippocampus (series 5, images 72, 69). Few additional punctate acute ischemic nonhemorrhagic infarcts about the right frontal lobe (series 5, images 83, 80). No other evidence for acute or subacute infarct. Gray-white matter differentiation otherwise maintained. No acute or chronic intracranial blood products. No mass lesion, midline shift or mass effect. No hydrocephalus or extra-axial fluid collection. Prominent dural calcifications noted along the anterior falx. Pituitary gland within normal limits. Vascular: Major intracranial vascular flow voids are maintained. Skull and upper cervical spine: Craniocervical junction within normal limits. Bone marrow signal intensity normal. No scalp soft tissue abnormality. Sinuses/Orbits: Globes orbital soft tissues within normal limits. Small right maxillary sinus retention cyst noted. Small volume pneumatized secretions noted within the left sphenoid sinus. No significant mastoid effusion. Other: None. IMPRESSION: 1. Patchy small volume acute ischemic nonhemorrhagic right PCA territory infarcts as above, with a few additional punctate  infarcts within the right frontal lobe. 2. Underlying mildly advanced cerebral atrophy. Electronically Signed   By: Virgia Griffins M.D.   On: 08/10/2023 02:39   CT ANGIO HEAD NECK W WO CM Result Date: 08/09/2023 CLINICAL DATA:  Initial evaluation for acute diplopia, Horner syndrome. EXAM: CT ANGIOGRAPHY HEAD AND NECK WITH AND WITHOUT CONTRAST TECHNIQUE: Multidetector CT imaging of the head and neck was performed using the standard protocol during bolus administration of intravenous contrast. Multiplanar CT image reconstructions and MIPs were obtained to evaluate the vascular anatomy. Carotid stenosis measurements (when applicable) are obtained utilizing NASCET criteria, using the distal internal carotid diameter as the denominator. RADIATION DOSE REDUCTION: This exam was performed according to the departmental dose-optimization program which includes automated exposure control, adjustment of the mA and/or kV according to patient size and/or use of iterative reconstruction technique. CONTRAST:  75mL OMNIPAQUE  IOHEXOL  350 MG/ML SOLN COMPARISON:  CT from earlier the same day. FINDINGS: CTA NECK FINDINGS Aortic arch: Visualized aortic arch within normal limits for caliber. Origin of the great vessels incompletely visualized on this exam. Mild aortic atherosclerosis. Right carotid system: Right CCA tortuous but patent without stenosis. Atheromatous change about the right carotid bulb. There is occlusion of the right ICA just distal to the bifurcation (series 7, image 110). Right ICA remains occluded to the terminus. Left carotid system: Left common and internal carotid arteries are tortuous but patent without dissection. Mild atheromatous change about the left carotid bulb without hemodynamically significant stenosis. Vertebral arteries: Left vertebral artery hypoplastic and likely arises from the aortic arch, although the origin is not visualized. Atheromatous change at the origin of the dominant right  vertebral artery with mild stenosis. Visualized vertebral arteries patent without stenosis or dissection. Skeleton: No worrisome osseous  lesions. Moderate multilevel cervical spondylosis, most pronounced at C5-6 and C6-7. Other neck: No other acute finding. Upper chest: Small area of reticulonodular tree-in-bud densities within the left upper lobe, likely mild infection/small airways disease (series 7, image 138). No other acute finding. Review of the MIP images confirms the above findings CTA HEAD FINDINGS Anterior circulation: For atheromatous change about the left carotid siphon without hemodynamically significant stenosis. Right ICA remains occluded to the terminus. A1 segments patent bilaterally. Left A1 dominant. Normal anterior communicating artery complex. Anterior cerebral arteries patent without stenosis. Left M1 segment and distal left MCA branches are widely patent and well perfused. Revascularization of the right MCA via collateral flow across the circle-of-Willis. Right MCA patent and perfused. Posterior circulation: Dominant right V4 segment patent without stenosis. Right PICA patent. Hypoplastic left vertebral artery largely terminates in PICA, although a tiny branch ascending towards the vertebrobasilar junction. Left PICA patent as well. Basilar patent without stenosis. Superior cerebral arteries patent bilaterally. Left PCA supplied via a hypoplastic left P1 segment and prominent left posterior communicating artery. Left PCA patent to its distal aspect without significant stenosis. Fetal type origin of the right PCA with severe stenosis versus occlusion at the origin of the right PCOM (series 8, image 107). Right PCA attenuated but patent distally without visible stenosis. Venous sinuses: Patent allowing for timing the contrast bolus. Anatomic variants: As above.  No aneurysm. Review of the MIP images confirms the above findings IMPRESSION: 1. Occlusion of the right ICA just distal to the  bifurcation, age indeterminate, but presumably acute given patient's symptoms. Right ICA remains occluded to the terminus. Revascularization of the right MCA via collateral flow across the circle-of-Willis. 2. Fetal type origin of the right PCA with severe stenosis versus occlusion at the origin of the right PCOM. Right PCA attenuated but patent distally. 3. Mild atheromatous change about the left carotid bulb and left carotid siphon without hemodynamically significant stenosis. 4. Small area of reticulonodular tree-in-bud densities within the left upper lobe, likely mild infection/small airways disease. 5.  Aortic Atherosclerosis (ICD10-I70.0). Critical Value/emergent results were called by telephone at the time of interpretation on 08/09/2023 at 10:40 pm to provider DAN FLOYD , who verbally acknowledged these results. Electronically Signed   By: Virgia Griffins M.D.   On: 08/09/2023 22:43   DG Chest 2 View Result Date: 08/09/2023 EXAM: 2 VIEW(S) XRAY OF THE CHEST 08/09/2023 09:22:00 PM COMPARISON: 02/23/2022 CLINICAL HISTORY: r/o mass. AMS FINDINGS: LUNGS AND PLEURA: Mild lingular and right middle lobe scarring, chronic. Calcified granuloma in the left lower lung. No pulmonary edema. No pleural effusion. No pneumothorax. HEART AND MEDIASTINUM: No acute abnormality of the cardiac and mediastinal silhouettes. Thoracic aortic atherosclerosis. BONES AND SOFT TISSUES: No acute osseous abnormality. IMPRESSION: 1. No acute findings. Electronically signed by: Zadie Herter MD 08/09/2023 09:28 PM EDT RP Workstation: [id number]   CT HEAD WO CONTRAST Result Date: 08/09/2023 CLINICAL DATA:  Neuro deficit, acute, stroke suspected; Facial trauma, blunt EXAM: CT HEAD WITHOUT CONTRAST CT MAXILLOFACIAL WITHOUT CONTRAST TECHNIQUE: Multidetector CT imaging of the head and maxillofacial structures were performed using the standard protocol without intravenous contrast. Multiplanar CT image reconstructions of the  maxillofacial structures were also generated. RADIATION DOSE REDUCTION: This exam was performed according to the departmental dose-optimization program which includes automated exposure control, adjustment of the mA and/or kV according to patient size and/or use of iterative reconstruction technique. COMPARISON:  None Available. FINDINGS: CT HEAD FINDINGS Brain: Atherosclerotic calcifications are present within the cavernous internal carotid  arteries. No evidence of large-territorial acute infarction. No parenchymal hemorrhage. No mass lesion. No extra-axial collection. No mass effect or midline shift. No hydrocephalus. Basilar cisterns are patent. Vascular: No hyperdense vessel. Atherosclerotic calcifications are present within the cavernous internal carotid arteries. Skull: No acute fracture or focal lesion. Other: None. CT MAXILLOFACIAL FINDINGS Osseous: No fracture or mandibular dislocation. No destructive process. Sinuses/Orbits: Right maxillary sinus mucosal thickening. Paranasal sinuses and mastoid air cells are clear. The orbits are unremarkable. Soft tissues: Negative. Visualized upper cervical spine: Degenerative changes. IMPRESSION: 1. No acute intracranial abnormality. 2.  No acute displaced facial fracture. Electronically Signed   By: Morgane  Naveau M.D.   On: 08/09/2023 20:29   CT Maxillofacial Wo Contrast Result Date: 08/09/2023 CLINICAL DATA:  Neuro deficit, acute, stroke suspected; Facial trauma, blunt EXAM: CT HEAD WITHOUT CONTRAST CT MAXILLOFACIAL WITHOUT CONTRAST TECHNIQUE: Multidetector CT imaging of the head and maxillofacial structures were performed using the standard protocol without intravenous contrast. Multiplanar CT image reconstructions of the maxillofacial structures were also generated. RADIATION DOSE REDUCTION: This exam was performed according to the departmental dose-optimization program which includes automated exposure control, adjustment of the mA and/or kV according to  patient size and/or use of iterative reconstruction technique. COMPARISON:  None Available. FINDINGS: CT HEAD FINDINGS Brain: Atherosclerotic calcifications are present within the cavernous internal carotid arteries. No evidence of large-territorial acute infarction. No parenchymal hemorrhage. No mass lesion. No extra-axial collection. No mass effect or midline shift. No hydrocephalus. Basilar cisterns are patent. Vascular: No hyperdense vessel. Atherosclerotic calcifications are present within the cavernous internal carotid arteries. Skull: No acute fracture or focal lesion. Other: None. CT MAXILLOFACIAL FINDINGS Osseous: No fracture or mandibular dislocation. No destructive process. Sinuses/Orbits: Right maxillary sinus mucosal thickening. Paranasal sinuses and mastoid air cells are clear. The orbits are unremarkable. Soft tissues: Negative. Visualized upper cervical spine: Degenerative changes. IMPRESSION: 1. No acute intracranial abnormality. 2.  No acute displaced facial fracture. Electronically Signed   By: Morgane  Naveau M.D.   On: 08/09/2023 20:29    Scheduled Meds:  aspirin  EC  81 mg Oral Daily   clopidogrel  75 mg Oral Daily   enoxaparin  (LOVENOX ) injection  40 mg Subcutaneous Q24H   escitalopram   10 mg Oral QHS   magnesium oxide  400 mg Oral BID   potassium chloride  40 mEq Oral Q4H   rosuvastatin   20 mg Oral QHS   sodium chloride  flush  3 mL Intravenous Once   Continuous Infusions:  cefTRIAXone (ROCEPHIN)  IV 1 g (08/11/23 1414)     LOS: 1 day   Time spent: 50 minutes  Unk Garb, DO  [facility]  08/11/2023, 5:17 PM

## 2023-08-11 NOTE — Subjective & Objective (Addendum)
 Pt seen and examined. Met with pt and her sister Alicia Wilkerson at bedside.  Awaiting CIR. Stable overnight.

## 2023-08-11 NOTE — Assessment & Plan Note (Addendum)
 08-11-2023 urine cx growing less than 10K CFU. She will complete 3 days of IV rocephin. Today is Day #2.   08-12-2023 completed 3 days of IV rocephin. UTI resolved.

## 2023-08-11 NOTE — Plan of Care (Signed)

## 2023-08-11 NOTE — Evaluation (Signed)
 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   History of essential hypertension 08/10/2023   Acute encephalopathy 08/09/2023   Left inguinal hernia 01/02/2023   Lower back pain 01/02/2023   Neck pain, musculoskeletal 12/15/2022   Vitamin D  deficiency 11/24/2022   Rash and nonspecific skin eruption 09/04/2022   Hypertension 11/18/2021   Family history of malignant neoplasm of gastrointestinal tract 08/14/2020   Acute hearing loss of left ear 08/14/2020   Aortic atherosclerosis (HCC) 11/04/2019   HLD (hyperlipidemia) 11/03/2019   CAD (coronary artery disease) 11/03/2019   S/P total hysterectomy and bilateral salpingo-oophorectomy, 2021 11/03/2019   Hyperglycemia 10/19/2018   Abnormal EKG 04/26/2018   Hearing loss 10/13/2017   Basal cell carcinoma (BCC) of skin of lower extremity including hip 05/19/2017   Anxiety 10/10/2016   Eczema 10/10/2016   H/O splenectomy for hemolytic spherocytosis 10/10/2016   H/O basal cell carcinoma excision 10/10/2016   History of cervical dysplasia 08/21/2016   Past Medical History:  Past Medical History:  Diagnosis Date   Anemia    Anxiety disorder    Basal cell carcinoma (BCC) of left upper arm 12/2015   Basal cell carcinoma (BCC) of right lower leg 04/2016   Borderline hypertension    CAD (coronary artery disease)    a. nonobst by cor CT 12/2018.   CIN II (cervical intraepithelial neoplasia II)    CIN  1   Depression    Eczema    Family history of premature CAD    History of basal cell carcinoma (BCC) excision    left upper arm 2017;   right lower leg 2018;  bilateral lower leg and hip area 2019;   inner  right lower leg, outer right thigh, & upper outside left arm 09/ 2020   History of cervical dysplasia 2013   History of hereditary spherocytosis    s/p splenectomy in 1954   History of hyperparathyroidism    per pt yrs ago was put on mega dose of vit d which caused the hyperparathyroid resolved after stopping vit d   History of pelvic fracture 2013   MAI (mycobacterium avium-intracellulare) (HCC)    ? possibly by CT 12/2018   Mild hyperlipidemia    Osteoporosis    PONV (postoperative nausea and vomiting)    S/P hernia repair 03/02/2023   S/P splenectomy    spherocytosis   Wears glasses    White coat syndrome without diagnosis of hypertension    Past Surgical History:  Past Surgical History:  Procedure Laterality Date   ANKLE SURGERY  03/14/2015   BREAST BIOPSY Left 08/30/2021   CYSTIC APOCRINE METAPLASIA   CERVICAL CONIZATION W/BX N/A 12/13/2018   Procedure: CONIZATION CERVIX WITH BIOPSY;  Surgeon: Lillian Rein, MD;  Location: [facility] OR;  Service: Gynecology;  Laterality: N/A;   CYSTOSCOPY N/A 10/18/2019   Procedure: CYSTOSCOPY;  Surgeon: Lillian Rein, MD;  Location: [facility];  Service: Gynecology;  Laterality: N/A;   IR RADIOLOGY PERIPHERAL GUIDED IV START  01/05/2019   IR US  GUIDE VASC ACCESS RIGHT  01/05/2019   KNEE SURGERY Left 1991   per pt retained hardward   LEEP N/A 12/13/2018   Procedure: possible LOOP  ELECTROSURGICAL EXCISION PROCEDURE (LEEP);  Surgeon: Lillian Rein, MD;  Location: [facility] OR;  Service: Gynecology;  Laterality: N/A;   ORIF ANKLE FRACTURE Left 03/14/2015   per pt retained hardware   ORIF WRIST FRACTURE Left 03/10/2022   Procedure: OPEN REDUCTION INTERNAL FIXATION (ORIF) WRIST FRACTURE;  Surgeon: Arvil Birks, MD;  Location: [facility] OR;  Service: Orthopedics;  Laterality: Left;  regional with iv sedation   SPLENECTOMY, TOTAL  1954   due to spherocytosis   TOTAL LAPAROSCOPIC HYSTERECTOMY WITH SALPINGECTOMY N/A 10/18/2019   Procedure: TOTAL  LAPAROSCOPIC HYSTERECTOMY WITH BILATERAL SALPINGECTOMY AND BILATERAL OOPHORECTOMY;  Surgeon: Lillian Rein, MD;  Location: [facility] [place];  Service: Gynecology;  Laterality: N/A;  BILATERAL SALPINGECTOMY POSS OOPHORECTOMY   TUBAL LIGATION Bilateral 1995   HPI:  Patient is a 73 y.o. female with PMH: HLD, osteoporosis, HTN. She presented to the hospital on 08/09/23 with AMS and multiple recent falls. MRI brain showed patchy small volume acute right PCA territory infarcts with a few additional punctate infarcts of the right frontal lobe. Additional workup showing UTI.   Assessment / Plan / Recommendation Clinical Impression  Patient presents with a cognitive impairment as per this evaluation, with impacted areas being delayed recall, sequencing, mental calculations, complex problem solving and organization. She participated in assessment via the [facility] Mental Status examination) and her score of 22 out of possible 30 places her in the scoring category for Mild Neurocognitive Disorder. Plan is for CIR prior to discharge home as patient lives by  herself. SLP is recommending skilled services at next venue of care to focus on higher level cognition.    SLP Assessment  SLP Recommendation/Assessment: All further Speech Language Pathology needs can be addressed in the next venue of care SLP Visit Diagnosis: Cognitive communication deficit (R41.841)     Assistance Recommended at Discharge  PRN  Functional Status Assessment Patient has had a recent decline in their functional status and demonstrates the ability to make significant improvements in function in a reasonable and predictable amount of time.  Frequency and Duration           SLP Evaluation Cognition  Overall Cognitive Status: Impaired/Different from baseline Arousal/Alertness: Awake/alert Orientation Level: Oriented X4 Year: 2025 Month: June Day of Week: Incorrect Attention: Sustained Sustained  Attention: Appears intact Memory: Impaired Memory Impairment: Retrieval deficit;Storage deficit Problem Solving: Impaired Problem Solving Impairment: Verbal complex Executive Function: Sequencing;Organizing Sequencing: Impaired       Comprehension  Auditory Comprehension Overall Auditory Comprehension: Appears within functional limits for tasks assessed    Expression Expression Primary Mode of Expression: Verbal Verbal Expression Overall Verbal Expression: Appears within functional limits for tasks assessed   Oral / Motor  Oral Motor/Sensory Function Overall Oral Motor/Sensory Function: Within functional limits Motor Speech Overall Motor Speech: Appears within functional limits for tasks assessed            Jacqualine Mater, MA, CCC-SLP Speech Therapy

## 2023-08-11 NOTE — Progress Notes (Signed)
 Inpatient Rehab Coordinator Note:  I met with pt and her sister, [name], at bedside to discuss CIR recommendations and goals/expectations of CIR stay.  We reviewed 3 hrs/day of therapy, physician follow up, and average length of stay 2 weeks (dependent upon progress) with goals of supervision 24/7.  Pt states that family has other obligations, but [name] confirms that family already planning to provide caregiver support at discharge.  We reviewed need for prior auth with [facility].  PMR MD to see today and I will send for prior auth once note is in.   Loye Rumble, PT, DPT Admissions Coordinator [phone] 08/11/23  12:43 PM

## 2023-08-11 NOTE — PMR Pre-admission (Signed)
 PMR Admission Coordinator Pre-Admission Assessment  Patient: Alicia Wilkerson is an 73 y.o., female MRN: [id number] DOB: August 19, 1950 Height: 5' 3 (160 cm) Weight: 56.1 kg              Insurance Information HMO: ***    PPO: ***     PCP:      IPA:      80/20:      OTHER:  PRIMARY: [employer] Medicare      Policy#: [id number]       Subscriber: pt CM Name: ***      Phone#: ***     Fax#: *** Pre-Cert#: ***      Employer: *** Benefits:  Phone #: ***     Name: *** Venson Ginger. Date: ***     Deduct: ***      Out of Pocket Max: ***      Life Max: ***  CIR: ***      SNF: *** Outpatient: ***     Co-Pay: *** Home Health: ***      Co-Pay: *** DME: ***     Co-Pay: *** Providers: *** SECONDARY:       Policy#:       Phone#:   Financial Counselor:       Phone#:   The "Data Collection Information Summary" for patients in Inpatient Rehabilitation Facilities with attached "Privacy Act Statement-Health Care Records" was provided and verbally reviewed with: Patient and Family  Emergency Contact Information Contact Information     Name Relation Home Work Mobile   O[employer] Daughter [phone]  [phone]   SAM[employer], UEDA [phone]  [phone]   Gibson Kurtz [phone]  [phone]      Other Contacts   None on File    Current Medical History  Patient Admitting Diagnosis: CVA  History of Present Illness: Pt is a 73 y/o female with PMH of HTN who presented to [facility] on 08/09/23 with 2 day history of AMS.  Pt's daughter reports pt experienced a ground level fall on 6/14 in which she leaned forward to pick something up, fell and hit her head.  No LOC.  Endorsed a second fall later that day out of the bed.  In ED pt was afebrile, vitals WNL, labs notable for sodium 134, potassium 3.4, glucose 309, bilirubin 1.6, WBC 16.7.  Imaging in the ED notable for occlusion of right ICA just distal to the bifurcation with revascularization of the R MCA via collateral flow across COW.  CTA  head/neck also showed R PCA with severe stenosis/occlusion with distal patency.  MRI revealed multiple embolic appearing infarcts in the right MCA and PCA territories.  ED workup also revealed UTI and tachycardia with intermittent rhythm regularity.  Neurology recommended DAPT x3 months, then plavix alone and loop recorder.  Therapy evaluations completed and pt was recommended for CIR.   Complete NIHSS TOTAL: 0 Glasgow Coma Scale Score: 15  Patient's medical record from [facility] has been reviewed by the rehabilitation admission coordinator and physician.  Past Medical History  Past Medical History:  Diagnosis Date   Anemia    Anxiety disorder    Basal cell carcinoma (BCC) of left upper arm 12/2015   Basal cell carcinoma (BCC) of right lower leg 04/2016   Borderline hypertension    CAD (coronary artery disease)    a. nonobst by cor CT 12/2018.   CIN II (cervical intraepithelial neoplasia II)    CIN  1   Depression    Eczema    Family  history of premature CAD    History of basal cell carcinoma (BCC) excision    left upper arm 2017;   right lower leg 2018;  bilateral lower leg and hip area 2019;   inner right lower leg, outer right thigh, & upper outside left arm 09/ 2020   History of cervical dysplasia 2013   History of hereditary spherocytosis    s/p splenectomy in 1954   History of hyperparathyroidism    per pt yrs ago was put on mega dose of vit d which caused the hyperparathyroid resolved after stopping vit d   History of pelvic fracture 2013   MAI (mycobacterium avium-intracellulare) (HCC)    ? possibly by CT 12/2018   Mild hyperlipidemia    Osteoporosis    PONV (postoperative nausea and vomiting)    S/P hernia repair 03/02/2023   S/P splenectomy    spherocytosis   Wears glasses    White coat syndrome without diagnosis of hypertension     Has the patient had major surgery during 100 days prior to admission? No  Family History  family history includes Colon cancer in  her mother; Crohn's disease in her daughter; Heart disease in her father and sister; Heart failure in her father; Hypertension in her father; Osteoporosis in her mother; Skin cancer in her father.   Current Medications   Current Facility-Administered Medications:    acetaminophen  (TYLENOL ) tablet 650 mg, 650 mg, Oral, Q6H PRN **OR** acetaminophen  (TYLENOL ) suppository 650 mg, 650 mg, Rectal, Q6H PRN, Howerter, Justin B, DO   aspirin  EC tablet 81 mg, 81 mg, Oral, Daily, Khaliqdina, Salman, MD, 81 mg at 08/11/23 0905   cefTRIAXone (ROCEPHIN) 1 g in sodium chloride  0.9 % 100 mL IVPB, 1 g, Intravenous, Q24H, Howerter, Justin B, DO, Last Rate: 200 mL/hr at 08/11/23 1414, 1 g at 08/11/23 1414   clopidogrel (PLAVIX) tablet 75 mg, 75 mg, Oral, Daily, Khaliqdina, Salman, MD, 75 mg at 08/11/23 0454   enoxaparin  (LOVENOX ) injection 40 mg, 40 mg, Subcutaneous, Q24H, Wouk, Haynes Lips, MD, 40 mg at 08/10/23 2201   escitalopram  (LEXAPRO ) tablet 10 mg, 10 mg, Oral, QHS, Wouk, Haynes Lips, MD   melatonin tablet 3 mg, 3 mg, Oral, QHS PRN, Howerter, Justin B, DO   ondansetron  (ZOFRAN ) injection 4 mg, 4 mg, Intravenous, Q6H PRN, Howerter, Justin B, DO   rosuvastatin  (CRESTOR ) tablet 20 mg, 20 mg, Oral, QHS, Howerter, Justin B, DO   sodium chloride  flush (NS) 0.9 % injection 3 mL, 3 mL, Intravenous, Once, Albertus Hughs, DO  Patients Current Diet:  Diet Order             Diet heart healthy/carb modified Room service appropriate? Yes; Fluid consistency: Thin  Diet effective now                   Precautions / Restrictions Precautions Precautions: Fall Precaution/Restrictions Comments: watch HR elevation, max 182bpm Restrictions Weight Bearing Restrictions Per Provider Order: No   Has the patient had 2 or more falls or a fall with injury in the past year?Yes  Prior Activity Level Community (5-7x/wk): independent with no DME prior to admit, driving, retired  Prior Functional Level Prior  Function Prior Level of Function : Independent/Modified Independent, Driving  Self Care: Did the patient need help bathing, dressing, using the toilet or eating?  Independent  Indoor Mobility: Did the patient need assistance with walking from room to room (with or without device)? Independent  Stairs: Did the patient need assistance with  internal or external stairs (with or without device)? Independent  Functional Cognition: Did the patient need help planning regular tasks such as shopping or remembering to take medications? Independent  Patient Information Are you of Hispanic, Latino/a,or Spanish origin?: A. No, not of Hispanic, Latino/a, or Spanish origin What is your race?: A. White Do you need or want an interpreter to communicate with a doctor or health care staff?: 0. No  Patient's Response To:  Health Literacy and Transportation Is the patient able to respond to health literacy and transportation needs?: Yes Health Literacy - How often do you need to have someone help you when you read instructions, pamphlets, or other written material from your doctor or pharmacy?: Never In the past 12 months, has lack of transportation kept you from medical appointments or from getting medications?: No In the past 12 months, has lack of transportation kept you from meetings, work, or from getting things needed for daily living?: No  Home Assistive Devices / Equipment Home Equipment: Agricultural consultant (2 wheels), [employer] - single point, BSC/3in1, Wheelchair - manual, Shower seat (maybe rshower seat, WC, BSC? daughter will check)  Prior Device Use: Indicate devices/aids used by the patient prior to current illness, exacerbation or injury? None of the above  Current Functional Level Cognition       Extremity Assessment (includes Sensation/Coordination)  Upper Extremity Assessment: LUE deficits/detail LUE Deficits / Details: minimally weaker as compared to R. additionally has notably decr  coordination with finger to nose, dysmetria, and dysdiadokinesia testing LUE Coordination: decreased fine motor, decreased gross motor  Lower Extremity Assessment: Defer to PT evaluation RLE Deficits / Details: grossly 4/5 or better. Rt adductor drift noted with SLR RLE Coordination: decreased gross motor, decreased fine motor LLE Coordination: decreased gross motor, decreased fine motor    ADLs  Overall ADL's : Needs assistance/impaired Eating/Feeding: Set up, Sitting Grooming: Contact guard assist, Sitting Upper Body Bathing: Contact guard assist, Sitting Lower Body Bathing: Minimal assistance, Sit to/from stand Upper Body Dressing : Contact guard assist, Sitting Lower Body Dressing: Minimal assistance, Sit to/from stand Toilet Transfer: Minimal assistance, Moderate assistance, +2 for physical assistance, +2 for safety/equipment Functional mobility during ADLs: Minimal assistance, Moderate assistance, Maximal assistance, +2 for safety/equipment, +2 for physical assistance General ADL Comments: LOB with turning in hall requiring max A to correct    Mobility  Overal bed mobility: Needs Assistance Bed Mobility: Supine to Sit, Sit to Supine Supine to sit: Contact guard, HOB elevated Sit to supine: Contact guard assist General bed mobility comments: cues for safety and lines, no assist    Transfers  Overall transfer level: Needs assistance Equipment used: None Transfers: Sit to/from Stand, Bed to chair/wheelchair/BSC Sit to Stand: Contact guard assist Bed to/from chair/wheelchair/BSC transfer type:: Step pivot Step pivot transfers: Contact guard assist, Min assist General transfer comment: minA to steady with turning to toilet, but pt using UE well to safely lower and rise from toilet    Ambulation / Gait / Stairs / Wheelchair Mobility  Ambulation/Gait Ambulation/Gait assistance: Editor, commissioning (Feet): 15 Feet Assistive device: None Gait Pattern/deviations: Step-through  pattern, Decreased stride length, Drifts right/left General Gait Details: limited to short distance ambulation due to HR elevation first to 140bpm then to 182bpm with pt standing in room after using bathroom. the pt was then returned to bed so RN could get EKG. further mobility held, not able to further assess/challenge balance. Gait velocity: decr Gait velocity interpretation: <1.31 ft/sec, indicative of household ambulator  Posture / Balance Balance Overall balance assessment: Needs assistance Sitting-balance support: Feet supported Sitting balance-Leahy Scale: Good Standing balance support: During functional activity, Single extremity supported, No upper extremity supported Standing balance-Leahy Scale: Poor Standing balance comment: not able to fully assess, increased sway with direction changes in bathroom and with turning to flush. Standardized Balance Assessment Standardized Balance Assessment : Dynamic Gait Index Dynamic Gait Index Level Surface: Normal Gait with Horizontal Head Turns: Mild Impairment Gait and Pivot Turn: Severe Impairment    Special needs/care consideration N/a     Previous Home Environment (from acute therapy documentation) Living Arrangements: Alone Available Help at Discharge: Family Type of Home: House Home Layout: Two level, Bed/bath upstairs Alternate Level Stairs-Rails: Left Alternate Level Stairs-Number of Steps: flight Home Access: Stairs to enter Entrance Stairs-Rails: Right, Left (wide) Entrance Stairs-Number of Steps: 4 Bathroom Shower/Tub: Associate Professor: Yes Home Care Services: No  Discharge Living Setting Plans for Discharge Living Setting: Patient's home (family to stay with her) Type of Home at Discharge: House Discharge Home Layout: Two level, Bed/bath upstairs Alternate Level Stairs-Rails: Left Alternate Level Stairs-Number of Steps: full flight Discharge Home Access: Stairs to  enter Entrance Stairs-Rails: Right, Left Entrance Stairs-Number of Steps: 4 Discharge Bathroom Shower/Tub: Tub/shower unit Discharge Bathroom Toilet: Standard Discharge Bathroom Accessibility: Yes How Accessible: Accessible via walker Does the patient have any problems obtaining your medications?: No  Social/Family/Support Systems Anticipated Caregiver: sister, [name], confirmed caregiver support Anticipated Caregiver's Contact Information: [phone] Ability/Limitations of Caregiver: none stated Caregiver Availability: 24/7 Discharge Plan Discussed with Primary Caregiver: Yes Is Caregiver In Agreement with Plan?: Yes Does Caregiver/Family have Issues with Lodging/Transportation while Pt is in Rehab?: No   Goals Patient/Family Goal for Rehab: PT/OT/SLP mod I Expected length of stay: 7-10 days Additional Information: Discharge plan: return to patient's home, family able to provide 24/7 supervision if needed Pt/Family Agrees to Admission and willing to participate: Yes Program Orientation Provided & Reviewed with Pt/Caregiver Including Roles  & Responsibilities: Yes   Decrease burden of Care through IP rehab admission: N/A   Possible need for SNF placement upon discharge: Not anticipated. Plan for discharge back to pt's home where family (sisters, friends, etc) can provide supervision if needed.    Patient Condition: This patient's condition remains as documented in the consult dated 08/11/23, in which the Rehabilitation Physician determined and documented that the patient's condition is appropriate for intensive rehabilitative care in an inpatient rehabilitation facility. Will admit to inpatient rehab pending insurance approval ***.  Preadmission Screen Completed By:  [name], PT, DPT 08/11/2023 2:28 PM ______________________________________________________________________   Discussed status with Dr. Aaron Aason***at *** and received approval for admission today.  Admission  Coordinator:  Caitlin E Warren, time***/Date***

## 2023-08-11 NOTE — Assessment & Plan Note (Signed)
 08-11-2023 treated on admission. Now resolved.

## 2023-08-12 DIAGNOSIS — R29700 NIHSS score 0: Secondary | ICD-10-CM | POA: Diagnosis not present

## 2023-08-12 DIAGNOSIS — I6521 Occlusion and stenosis of right carotid artery: Secondary | ICD-10-CM | POA: Diagnosis not present

## 2023-08-12 DIAGNOSIS — I63531 Cerebral infarction due to unspecified occlusion or stenosis of right posterior cerebral artery: Secondary | ICD-10-CM | POA: Diagnosis not present

## 2023-08-12 DIAGNOSIS — I63511 Cerebral infarction due to unspecified occlusion or stenosis of right middle cerebral artery: Secondary | ICD-10-CM | POA: Diagnosis not present

## 2023-08-12 LAB — BASIC METABOLIC PANEL WITH GFR
Anion gap: 6 (ref 5–15)
BUN: 9 mg/dL (ref 8–23)
CO2: 26 mmol/L (ref 22–32)
Calcium: 9 mg/dL (ref 8.9–10.3)
Chloride: 105 mmol/L (ref 98–111)
Creatinine, Ser: 0.64 mg/dL (ref 0.44–1.00)
GFR, Estimated: >60 mL/min (ref >60)
Glucose, Bld: 102 mg/dL — ABNORMAL HIGH (ref 70–99)
Potassium: 4.3 mmol/L (ref 3.5–5.1)
Sodium: 137 mmol/L (ref 135–145)

## 2023-08-12 LAB — CBC
HCT: 35.6 % — ABNORMAL LOW (ref 36.0–46.0)
Hemoglobin: 12.4 g/dL (ref 12.0–15.0)
MCH: 34.4 pg — ABNORMAL HIGH (ref 26.0–34.0)
MCHC: 34.8 g/dL (ref 30.0–36.0)
MCV: 98.9 fL (ref 80.0–100.0)
Platelets: 322 K/uL (ref 150–400)
RBC: 3.6 MIL/uL — ABNORMAL LOW (ref 3.87–5.11)
RDW: 16.2 % — ABNORMAL HIGH (ref 11.5–15.5)
WBC: 10.6 K/uL — ABNORMAL HIGH (ref 4.0–10.5)
nRBC: 0 % (ref 0.0–0.2)

## 2023-08-12 LAB — MAGNESIUM: Magnesium: 2.1 mg/dL (ref 1.7–2.4)

## 2023-08-12 MED ORDER — MAGNESIUM OXIDE -MG SUPPLEMENT 400 (240 MG) MG PO TABS: 400.0000 mg | ORAL_TABLET | Freq: Two times a day (BID) | ORAL | Status: DC

## 2023-08-12 MED ORDER — POTASSIUM CHLORIDE CRYS ER 20 MEQ PO TBCR: 20.0000 meq | EXTENDED_RELEASE_TABLET | Freq: Every day | ORAL | Status: DC

## 2023-08-12 MED ORDER — POTASSIUM CHLORIDE CRYS ER 20 MEQ PO TBCR
20.0000 meq | EXTENDED_RELEASE_TABLET | Freq: Every day | ORAL | Status: DC
  Filled 2023-08-12: qty 1

## 2023-08-12 MED ORDER — CLOPIDOGREL BISULFATE 75 MG PO TABS: 75.0000 mg | ORAL_TABLET | Freq: Every day | ORAL | Status: DC

## 2023-08-12 MED ORDER — ASPIRIN 81 MG PO TBEC: 81.0000 mg | DELAYED_RELEASE_TABLET | Freq: Every day | ORAL | Status: DC

## 2023-08-12 MED ORDER — FISH OIL 600 MG PO CAPS: 2.0000 | ORAL_CAPSULE | Freq: Every day | ORAL | Status: DC

## 2023-08-12 NOTE — Progress Notes (Signed)
 STROKE TEAM PROGRESS NOTE    SIGNIFICANT HOSPITAL EVENTS  She was found to have a urinary tract infection as well as multiple, embolic appearing right-sided strokes on MRI.  Right ICA occlusion with revascularization via collateral flow seen on CT angio  INTERIM HISTORY/SUBJECTIVE  Sister at bedside, patient states she is doing well and wants to go home. Carotid ultrasound shows no significant bilateral extracranial stenosis.  Transthoracic echo showed ejection fraction of 60 to 65% with mild left ventricular hypertrophy.  Left atrial size moderately dilated.    OBJECTIVE  CBC    Component Value Date/Time   WBC 10.6 (H) 08/12/2023 0507   RBC 3.60 (L) 08/12/2023 0507   HGB 12.4 08/12/2023 0507   HGB 13.5 08/21/2016 0939   HGB 13.3 04/28/2014 1049   HCT 35.6 (L) 08/12/2023 0507   HCT 39.5 08/21/2016 0939   PLT 322 08/12/2023 0507   PLT 360 08/21/2016 0939   MCV 98.9 08/12/2023 0507   MCV 97 08/21/2016 0939   MCH 34.4 (H) 08/12/2023 0507   MCHC 34.8 08/12/2023 0507   RDW 16.2 (H) 08/12/2023 0507   RDW 15.4 08/21/2016 0939   LYMPHSABS 1.5 08/10/2023 0542   MONOABS 1.8 (H) 08/10/2023 0542   EOSABS 0.0 08/10/2023 0542   BASOSABS 0.1 08/10/2023 0542    BMET    Component Value Date/Time   NA 137 08/12/2023 0507   NA 139 08/21/2016 0939   K 4.3 08/12/2023 0507   CL 105 08/12/2023 0507   CO2 26 08/12/2023 0507   GLUCOSE 102 (H) 08/12/2023 0507   BUN 9 08/12/2023 0507   BUN 17 08/21/2016 0939   CREATININE 0.64 08/12/2023 0507   CREATININE 0.71 11/04/2019 0955   CALCIUM  9.0 08/12/2023 0507   CALCIUM  9.7 06/17/2012 0906   GFRNONAA >60 08/12/2023 0507    IMAGING past 24 hours VAS US  CAROTID Result Date: 08/11/2023 Carotid Arterial Duplex Study Patient Name:  Alicia Wilkerson  Date of Exam:   08/11/2023 Medical Rec #: [id number]        Accession #:    [id number] Date of Birth: Dec 02, 1950        Patient Gender: F Patient Age:   73 years Exam Location:  [facility]  Procedure:      VAS US  CAROTID Referring Phys: Roxan Copes --------------------------------------------------------------------------------  Risk Factors: Hypertension, hyperlipidemia, coronary artery disease. Performing Technologist: Arlyce Berger RVT, RDMS  Examination Guidelines: A complete evaluation includes B-mode imaging, spectral Doppler, color Doppler, and power Doppler as needed of all accessible portions of each vessel. Bilateral testing is considered an integral part of a complete examination. Limited examinations for reoccurring indications may be performed as noted.  Right Carotid Findings: +----------+--------+--------+--------+------------------+--------+           PSV cm/sEDV cm/sStenosisPlaque DescriptionComments +----------+--------+--------+--------+------------------+--------+ CCA Prox  71      0                                          +----------+--------+--------+--------+------------------+--------+ CCA Distal51      0                                          +----------+--------+--------+--------+------------------+--------+ ICA Prox  Occluded                           +----------+--------+--------+--------+------------------+--------+ ICA Distal                Occluded                           +----------+--------+--------+--------+------------------+--------+ ECA       144     3                                          +----------+--------+--------+--------+------------------+--------+ +----------+--------+-------+----------------+-------------------+           PSV cm/sEDV cmsDescribe        Arm Pressure (mmHG) +----------+--------+-------+----------------+-------------------+ [id number]            Multiphasic, WNL                    +----------+--------+-------+----------------+-------------------+ +---------+--------+--+--------+--+---------+ VertebralPSV cm/s58EDV cm/s15Antegrade  +---------+--------+--+--------+--+---------+  Left Carotid Findings: +----------+--------+--------+--------+------------------+------------------+           PSV cm/sEDV cm/sStenosisPlaque DescriptionComments           +----------+--------+--------+--------+------------------+------------------+ CCA Prox  114     23                                intimal thickening +----------+--------+--------+--------+------------------+------------------+ CCA Distal78      16                                intimal thickening +----------+--------+--------+--------+------------------+------------------+ ICA Prox  85      19              focal and calcific                   +----------+--------+--------+--------+------------------+------------------+ ICA Distal77      23                                                   +----------+--------+--------+--------+------------------+------------------+ ECA       92      0                                                    +----------+--------+--------+--------+------------------+------------------+ +----------+--------+--------+----------------+-------------------+           PSV cm/sEDV cm/sDescribe        Arm Pressure (mmHG) +----------+--------+--------+----------------+-------------------+ [id number]             Multiphasic, WNL                    +----------+--------+--------+----------------+-------------------+ +---------+--------+--+--------+-+---------+ VertebralPSV cm/s62EDV cm/s6Antegrade +---------+--------+--+--------+-+---------+   Summary: Right Carotid: Evidence consistent with a total occlusion of the right ICA. Left Carotid: The extracranial vessels were near-normal with only minimal wall               thickening or plaque. Vertebrals:  Bilateral vertebral arteries demonstrate antegrade flow. Subclavians: Normal flow hemodynamics were seen  in bilateral subclavian              arteries. *See table(s) above for  measurements and observations.  Electronically signed by Runell Countryman on 08/11/2023 at 4:32:35 PM.    Final     Vitals:   08/11/23 1923 08/11/23 2306 08/12/23 0430 08/12/23 0919  BP: 128/63 128/71 117/62 132/62  Pulse: 63 (!) 104  (!) 51  Resp: 19 17 17 16   Temp: 98.4 F (36.9 C) 99.7 F (37.6 C) 98.8 F (37.1 C) 97.8 F (36.6 C)  TempSrc: Oral Oral Oral Oral  SpO2: 96% 93%  95%  Weight:      Height:         PHYSICAL EXAM General:  Alert, well-nourished, well-developed patient in no acute distress CV: Paced on monitor. ST on EKG this admission Respiratory:  Regular, unlabored respirations on room air   NEURO:  Mental Status: AA&Ox3, patient is able to give clear and coherent history Speech/Language: speech is without dysarthria or aphasia.  Naming, repetition, fluency, and comprehension intact.  Cranial Nerves:  II: PERRL. Visual fields full.  III, IV, VI: EOMI. Eyelids elevate symmetrically.  V: Sensation is intact to light touch and symmetrical to face.  VII: Face is symmetrical resting and smiling VIII: hearing intact to voice. IX, X: Palate elevates symmetrically. Phonation is normal.  UJ:[id number] shrug 5/5. XII: tongue is midline without fasciculations. Motor: 5/5 strength to all muscle groups tested.  Tone: is normal and bulk is normal Sensation- Intact to light touch bilaterally. Extinction absent to light touch to DSS.   Coordination: FTN intact bilaterally.  Decreased fine motor movements with orbiting seen on the left Gait- deferred  Most Recent NIH: 0     ASSESSMENT/PLAN  [name] is a 73 y.o. female with history of hypertension and hyperlipidemia who originally presented with altered mental status and was found to have a urinary tract infection.  MRI brain was performed, revealing multiple embolic appearing infarcts in the right MCA and PCA territories.  NIH on Admission: 0.  Multiple acute ischemic infarcts right MCA/PCA  territories Etiology:failure of collaterals in chronic occlusion versus embolic, pending full stroke workup. CT Head without contrast(Personally reviewed): No acute abnormality CT angio Head and Neck with contrast(Personally reviewed): Occlusion of the right ICA just distal to the bifurcation, age-indeterminate, revascularization of right MCA via collateral flow across the circle of Willis, fetal type right PCA with stenting of your stenosis versus occlusion and origin of right P-comm, mild atheromatous change about the left carotid bulb MRI Brain(Personally reviewed): Multiple, embolic appearing infarcts in the right cerebral hemisphere, and right MCA and PCA territory, mild atrophy Carotid Doppler: Bilateral 1-29% carotid stenosis.  2D Echo: EF of 60 to 65%, moderately dilated left atria. Transthoracic echo ejection fraction 60 to 65%.  Mild left ventricular hypertrophy.  Left atrial size moderately dilated. 30 day heart monitor recommended at discharge LDL 64 HgbA1c 3.7 VTE prophylaxis - lovenox  aspirin  81 mg daily prior to admission, now on aspirin  81 mg daily and clopidogrel 75 mg daily for 3 months and then Plavix alone. If Atrial Fibrillation is diagnosed, would recommend starting OAC, likely eliquis instead.  Therapy recommendations:  CIR Disposition:  pending  Hypertension Home meds: Losartan  25 mg daily Stable Blood Pressure Goal: BP less than 220/110 for 24 hours after symptom onset, then gradually normalize Avoid hypotension due to intracranial stenosis  Hyperlipidemia Home meds: Crestor  20 mg daily, resumed in hospital LDL 64, goal < 70 Continue  statin at discharge  Other Stroke Risk Factors Coronary artery disease  Other Active Problems UTI Currently on Rocephin Hx of Anxiety/Depression  Hospital day # 2    Patient presented with several day history of confusion, encephalopathy falls and a UTI.  MRI scan shows patchy right PCA small infarcts with CT angiogram  showing what looks like chronic right ICA occlusion.  Recommend dual antiplatelet therapy aspirin  Plavix for 3 months followed by Plavix alone.  Aggressive risk factor modification.  30-day heart monitor at discharge to look for paroxysmal A-fib.  Mobilize out of bed.  Therapy consults.  Long discussion with patient and sister at the bedside and answered questions.  Follow-up as an outpatient stroke clinic in 2 months.  Stroke team will sign off.  Kindly call for questions.  Greater than 50% time during this  35 minute visit was spent in counseling and coordination of care about her strokes and carotid occlusion and discussion about stroke evaluation and prevention and treatment and answering questions.  Ardella Beaver, MD Medical Director [facility] Pager: [phone] 08/12/2023 11:52 AM  To contact Stroke Continuity provider, please refer to [email or web address]. After hours, contact General Neurology

## 2023-08-12 NOTE — Progress Notes (Signed)
 Patient got up to restroom with staff assistance. Patient reported feeling weaker and like her left leg was stuck to the floor/heavy. Got patient back to bed and called the stroke response RN and sent secure chat to Dr. Janett Medin.  Even after being in bed, patient still feels fatigued but also feel like she had a busy afternoon walking with PT in the hall.

## 2023-08-12 NOTE — Progress Notes (Addendum)
 SRN asked to come see pt. Pt states left leg got a little weak when she went to the bath room. Pt has had rt sided CVA. Symptoms have now resolved, NIHSS 0. 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 Pt advised to take it easy and get up slowly and utilize staff/ equipment as necessary.   Mechele Spiegel RN Stroke Response [phone] 7A -7P, M-F

## 2023-08-12 NOTE — H&P (Incomplete)
 Physical Medicine and Rehabilitation Admission H&P    Chief Complaint  Patient presents with   Extremity Weakness   Altered Mental Status  : HPI: Alicia Wilkerson is a 73 year old right handed female with history significant for essential hypertension, hyperlipidemia, anxiety/depression disorder, CAD maintained on low-dose aspirin , history of basal cell carcinoma left upper arm 2017, right lower leg 2018, bilateral lower leg and hip area 2019, inner right lower leg, outer right thigh and upper outside left arm 10/2018, osteoporosis, cervical intraepithelial neoplasia,.  Per chart review patient lives alone.  Two-level home bed and bath upstairs.  Reportedly independent driving prior to admission.  Presented 08/09/2023 with altered mental status after being confused for 1 to 2 days.  Patient had a reported fall x 2 and did strike her head on the floor.  Cranial CT scan negative for acute intracranial abnormality.  No acute displaced facial fracture.  CT maxillofacial negative.  CTA of head and neck showed occlusion of the right ICA just distal to the bifurcation, age-indeterminate, but presumably acute given patient's symptoms.  Right ICA remained occluded to the terminus.  Revascularization of the right MCA via collateral flow across the circle of Willis.  No intervention was required for internal carotid artery occlusion.  MRI showed patchy small volume acute ischemic nonhemorrhagic right PCA territory infarct with a few additional punctate infarcts within the right frontal lobe.  Underlying mildly advanced cerebral atrophy.  Admission chemistries unremarkable except WBC 16,700, sodium 134, potassium 3.4, glucose 309, total bilirubin 1.6, hemoglobin A1c 3.7, urinalysis negative and culture OB urine less than 10,000 insignificant growth nitrite.  Echocardiogram with ejection fraction of 60 to 65% no wall motion abnormalities grade 1 diastolic dysfunction.  Neurology follow-up placed on aspirin  81 mg daily  and Plavix 75 mg daily x 3 months then Plavix alone.  Recommendations of 30-day heart monitor at discharge.  Patient did complete a 3-day empiric course of IV Rocephin for acute cystitis/UTI.  Hospital course 08/11/2023 episode of PSVT heart rate in 120s narrow complex tachycardia.  Valsalva maneuver performed patient terminated within about 3 seconds.  Beta-blocker was not started due to resting heart rate in the 50s and low 60s and monitored.  Lovenox  was initiated for DVT prophylaxis.  She is maintained on a regular consistency diet.  Therapy evaluations completed due to patient decreased functional mobility was admitted for a comprehensive rehab program.  Review of Systems  Constitutional:  Negative for chills and fever.  HENT:  Negative for hearing loss.   Eyes:  Negative for blurred vision and double vision.  Respiratory:  Negative for cough, shortness of breath and wheezing.   Cardiovascular:  Positive for palpitations. Negative for leg swelling.  Gastrointestinal:  Positive for constipation. Negative for heartburn, nausea and vomiting.  Genitourinary:  Negative for dysuria, flank pain and hematuria.  Musculoskeletal:  Positive for falls and myalgias.  Neurological:  Positive for weakness.  Psychiatric/Behavioral:  Positive for depression.        Nervousness/anxiety  All other systems reviewed and are negative.  Past Medical History:  Diagnosis Date   Anemia    Anxiety disorder    Basal cell carcinoma (BCC) of left upper arm 12/2015   Basal cell carcinoma (BCC) of right lower leg 04/2016   Borderline hypertension    CAD (coronary artery disease)    a. nonobst by cor CT 12/2018.   CIN II (cervical intraepithelial neoplasia II)    CIN  1   Depression  Eczema    Family history of malignant neoplasm of gastrointestinal tract 08/14/2020   Family history of premature CAD    History of basal cell carcinoma (BCC) excision    left upper arm 2017;   right lower leg 2018;  bilateral  lower leg and hip area 2019;   inner right lower leg, outer right thigh, & upper outside left arm 09/ 2020   History of cervical dysplasia 2013   History of hereditary spherocytosis    s/p splenectomy in 1954   History of hyperparathyroidism    per pt yrs ago was put on mega dose of vit d which caused the hyperparathyroid resolved after stopping vit d   History of pelvic fracture 2013   Left inguinal hernia 01/02/2023   MAI (mycobacterium avium-intracellulare) (HCC)    ? possibly by CT 12/2018   Mild hyperlipidemia    Osteoporosis    PONV (postoperative nausea and vomiting)    S/P hernia repair 03/02/2023   S/P splenectomy    spherocytosis   Wears glasses    White coat syndrome without diagnosis of hypertension    Past Surgical History:  Procedure Laterality Date   ANKLE SURGERY  03/14/2015   BREAST BIOPSY Left 08/30/2021   CYSTIC APOCRINE METAPLASIA   CERVICAL CONIZATION W/BX N/A 12/13/2018   Procedure: CONIZATION CERVIX WITH BIOPSY;  Surgeon: Lillian Rein, MD;  Location: [facility] OR;  Service: Gynecology;  Laterality: N/A;   CYSTOSCOPY N/A 10/18/2019   Procedure: CYSTOSCOPY;  Surgeon: Lillian Rein, MD;  Location: [facility];  Service: Gynecology;  Laterality: N/A;   IR RADIOLOGY PERIPHERAL GUIDED IV START  01/05/2019   IR US  GUIDE VASC ACCESS RIGHT  01/05/2019   KNEE SURGERY Left 1991   per pt retained hardward   LEEP N/A 12/13/2018   Procedure: possible LOOP ELECTROSURGICAL EXCISION PROCEDURE (LEEP);  Surgeon: Lillian Rein, MD;  Location: [facility] OR;  Service: Gynecology;  Laterality: N/A;   ORIF ANKLE FRACTURE Left 03/14/2015   per pt retained hardware   ORIF WRIST FRACTURE Left 03/10/2022   Procedure: OPEN REDUCTION INTERNAL FIXATION (ORIF) WRIST FRACTURE;  Surgeon: Arvil Birks, MD;  Location: [facility] OR;  Service: Orthopedics;  Laterality: Left;  regional with iv sedation   SPLENECTOMY, TOTAL  1954   due to spherocytosis   TOTAL LAPAROSCOPIC HYSTERECTOMY WITH  SALPINGECTOMY N/A 10/18/2019   Procedure: TOTAL LAPAROSCOPIC HYSTERECTOMY WITH BILATERAL SALPINGECTOMY AND BILATERAL OOPHORECTOMY;  Surgeon: Lillian Rein, MD;  Location: [facility];  Service: Gynecology;  Laterality: N/A;  BILATERAL SALPINGECTOMY POSS OOPHORECTOMY   TUBAL LIGATION Bilateral 1995   Family History  Problem Relation Age of Onset   Osteoporosis Mother    Colon cancer Mother        deceased   Heart failure Father    Skin cancer Father    Hypertension Father    Heart disease Father    Heart disease Sister    Crohn's disease Daughter    Breast cancer Neg Hx    Social History:  reports that she has never smoked. She has never used smokeless tobacco. She reports that she does not currently use alcohol. She reports that she does not use drugs. Allergies:  Allergies  Allergen Reactions   Penicillins Rash    Did it involve swelling of the face/tongue/throat, SOB, or low BP? No Did it involve sudden or severe rash/hives, skin peeling, or any reaction on the inside of your mouth or nose?  #  #  #  YES  #  #  #  Did you need to seek medical attention at a hospital or doctor's office? #  #  #  YES  #  #  #  When did it last happen?   years ago    If all above answers are NO, may proceed with cephalosporin use.    Sulfa Antibiotics Hives and Other (See Comments)   Nickel Hives   Penicillin G Other (See Comments)   Medications Prior to Admission  Medication Sig Dispense Refill   aspirin  EC 81 MG tablet Take 81 mg by mouth at bedtime.     clonazePAM  (KLONOPIN ) 0.5 MG tablet 1 tab by mouth once daily as needed 30 tablet 0   escitalopram  (LEXAPRO ) 10 MG tablet TAKE ONE TABLET BY MOUTH ONCE A DAY (Patient taking differently: Take 10 mg by mouth at bedtime.) 90 tablet 0   losartan  (COZAAR ) 25 MG tablet Take 1 tablet (25 mg total) by mouth daily. (Patient taking differently: Take 25 mg by mouth at bedtime.) 90 tablet 1   Omega-3 Fatty Acids (FISH OIL) 600 MG CAPS  Take 1,200 capsules by mouth at bedtime.     rosuvastatin  (CRESTOR ) 20 MG tablet Take 1 tablet (20 mg total) by mouth daily. (Patient taking differently: Take 20 mg by mouth at bedtime.) 90 tablet 0      Home: Home Living Family/patient expects to be discharged to:: Private residence Living Arrangements: Alone Available Help at Discharge: Family Type of Home: House Home Access: Stairs to enter Secretary/administrator of Steps: 4 Entrance Stairs-Rails: Right, Left (wide) Home Layout: Two level, Bed/bath upstairs Alternate Level Stairs-Number of Steps: flight Alternate Level Stairs-Rails: Left Bathroom Shower/Tub: Engineer, manufacturing systems: Standard Bathroom Accessibility: Yes Home Equipment: Agricultural consultant (2 wheels), [employer] - single point, BSC/3in1, Wheelchair - manual, Shower seat (maybe rshower seat, WC, BSC? daughter will check)  Lives With: Alone   Functional History: Prior Function Prior Level of Function : Independent/Modified Independent, Driving  Functional Status:  Mobility: Bed Mobility Overal bed mobility: Needs Assistance Bed Mobility: Supine to Sit, Sit to Supine Supine to sit: Contact guard, HOB elevated Sit to supine: Contact guard assist General bed mobility comments: cues for safety and lines, no assist Transfers Overall transfer level: Needs assistance Equipment used: None Transfers: Sit to/from Stand, Bed to chair/wheelchair/BSC Sit to Stand: Contact guard assist Bed to/from chair/wheelchair/BSC transfer type:: Step pivot Step pivot transfers: Contact guard assist, Min assist General transfer comment: minA to steady with turning to toilet, but pt using UE well to safely lower and rise from toilet Ambulation/Gait Ambulation/Gait assistance: Min assist Gait Distance (Feet): 15 Feet Assistive device: None Gait Pattern/deviations: Step-through pattern, Decreased stride length, Drifts right/left General Gait Details: limited to short distance  ambulation due to HR elevation first to 140bpm then to 182bpm with pt standing in room after using bathroom. the pt was then returned to bed so RN could get EKG. further mobility held, not able to further assess/challenge balance. Gait velocity: decr Gait velocity interpretation: <1.31 ft/sec, indicative of household ambulator    ADL: ADL Overall ADL's : Needs assistance/impaired Eating/Feeding: Set up, Sitting Grooming: Contact guard assist, Sitting Upper Body Bathing: Contact guard assist, Sitting Lower Body Bathing: Minimal assistance, Sit to/from stand Upper Body Dressing : Contact guard assist, Sitting Lower Body Dressing: Minimal assistance, Sit to/from stand Toilet Transfer: Minimal assistance, Moderate assistance, +2 for physical assistance, +2 for safety/equipment Functional mobility during ADLs: Minimal assistance, Moderate assistance, Maximal assistance, +2  for safety/equipment, +2 for physical assistance General ADL Comments: LOB with turning in hall requiring max A to correct  Cognition: Cognition Overall Cognitive Status: Impaired/Different from baseline Arousal/Alertness: Awake/alert Orientation Level: Oriented X4 Year: 2025 Month: June Day of Week: Incorrect Attention: Sustained Sustained Attention: Appears intact Memory: Impaired Memory Impairment: Retrieval deficit, Storage deficit Problem Solving: Impaired Problem Solving Impairment: Verbal complex Executive Function: Sequencing, Organizing Sequencing: Impaired Cognition Arousal: Alert Behavior During Therapy: WFL for tasks assessed/performed Overall Cognitive Status: Impaired/Different from baseline  Physical Exam: Blood pressure 128/67, pulse 65, temperature 98.1 F (36.7 C), temperature source Oral, resp. rate 18, height 5' 3 (1.6 m), weight 56.1 kg, last menstrual period 02/24/2002, SpO2 94%. Physical Exam  Neurological:     Comments: Patient is alert.  Sitting up in bed.  Makes eye contact with  examiner.  Follows simple commands.  Provides name and age as well as date of birth with some initial delay in processing.    Results for orders placed or performed during the hospital encounter of 08/09/23 (from the past 48 hours)  Lipid panel     Status: None   Collection Time: 08/11/23  6:13 AM  Result Value Ref Range   Cholesterol 128 0 - 200 mg/dL   Triglycerides 59 <829 mg/dL   HDL 52 >56 mg/dL   Total CHOL/HDL Ratio 2.5 RATIO   VLDL 12 0 - 40 mg/dL   LDL Cholesterol 64 0 - 99 mg/dL    Comment:        Total Cholesterol/HDL:CHD Risk Coronary Heart Disease Risk Table                     Men   Women  1/2 Average Risk   3.4   3.3  Average Risk       5.0   4.4  2 X Average Risk   9.6   7.1  3 X Average Risk  23.4   11.0        Use the calculated Patient Ratio above and the CHD Risk Table to determine the patient's CHD Risk.        ATP III CLASSIFICATION (LDL):  <100     mg/dL   Optimal  213-086  mg/dL   Near or Above                    Optimal  130-159  mg/dL   Borderline  578-469  mg/dL   High  >629     mg/dL   Very High Performed at [facility], [address] [place], [place] [address]   CBC     Status: Abnormal   Collection Time: 08/11/23  6:13 AM  Result Value Ref Range   WBC 11.6 (H) 4.0 - 10.5 K/uL   RBC 3.48 (L) 3.87 - 5.11 MIL/uL   Hemoglobin 12.0 12.0 - 15.0 g/dL   HCT 32.4 (L) 40.1 - 02.7 %   MCV 97.7 80.0 - 100.0 fL   MCH 34.5 (H) 26.0 - 34.0 pg   MCHC 35.3 30.0 - 36.0 g/dL   RDW 25.3 (H) 66.4 - 40.3 %   Platelets 346 150 - 400 K/uL   nRBC 0.0 0.0 - 0.2 %    Comment: Performed at [facility], [address], [place], [place] [address]  Basic metabolic panel with GFR     Status: Abnormal   Collection Time: 08/11/23  6:13 AM  Result Value Ref Range  Sodium 135 135 - 145 mmol/L   Potassium 3.6 3.5 - 5.1 mmol/L   Chloride 101 98 - 111 mmol/L   CO2 25 22 - 32 mmol/L   Glucose, Bld 106 (H) 70 - 99 mg/dL    Comment: Glucose reference  range applies only to samples taken after fasting for at least 8 hours.   BUN 9 8 - 23 mg/dL   Creatinine, Ser 5.40 0.44 - 1.00 mg/dL   Calcium  8.7 (L) 8.9 - 10.3 mg/dL   GFR, Estimated >98 >11 mL/min    Comment: (NOTE) Calculated using the CKD-EPI Creatinine Equation (2021)    Anion gap 9 5 - 15    Comment: Performed at [facility], [address] [place], [place] [address]  CBC     Status: Abnormal   Collection Time: 08/12/23  5:07 AM  Result Value Ref Range   WBC 10.6 (H) 4.0 - 10.5 K/uL   RBC 3.60 (L) 3.87 - 5.11 MIL/uL   Hemoglobin 12.4 12.0 - 15.0 g/dL   HCT 29.5 (L) 62.1 - 30.8 %   MCV 98.9 80.0 - 100.0 fL   MCH 34.4 (H) 26.0 - 34.0 pg   MCHC 34.8 30.0 - 36.0 g/dL   RDW 65.7 (H) 84.6 - 96.2 %   Platelets 322 150 - 400 K/uL   nRBC 0.0 0.0 - 0.2 %    Comment: Performed at [facility], [address] [place], [place] [address]  Magnesium     Status: None   Collection Time: 08/12/23  5:07 AM  Result Value Ref Range   Magnesium 2.1 1.7 - 2.4 mg/dL    Comment: Performed at [facility], [address], [place], [place] [address]  Basic metabolic panel with GFR     Status: Abnormal   Collection Time: 08/12/23  5:07 AM  Result Value Ref Range   Sodium 137 135 - 145 mmol/L   Potassium 4.3 3.5 - 5.1 mmol/L   Chloride 105 98 - 111 mmol/L   CO2 26 22 - 32 mmol/L   Glucose, Bld 102 (H) 70 - 99 mg/dL    Comment: Glucose reference range applies only to samples taken after fasting for at least 8 hours.   BUN 9 8 - 23 mg/dL   Creatinine, Ser 0.10 0.44 - 1.00 mg/dL   Calcium  9.0 8.9 - 10.3 mg/dL   GFR, Estimated >27 >25 mL/min    Comment: (NOTE) Calculated using the CKD-EPI Creatinine Equation (2021)    Anion gap 6 5 - 15    Comment: Performed at [facility], [address], [place], [place] [address]   VAS US  CAROTID Result Date: 08/11/2023 Carotid Arterial Duplex Study Patient Name:  Alicia Wilkerson  Date of Exam:   08/11/2023 Medical Rec #:  [id number]        Accession #:    [id number] Date of Birth: 02-06-1951        Patient Gender: F Patient Age:   69 years Exam Location:  [facility] Procedure:      VAS US  CAROTID Referring Phys: Roxan Copes --------------------------------------------------------------------------------  Risk Factors: Hypertension, hyperlipidemia, coronary artery disease. Performing Technologist: Arlyce Berger RVT, RDMS  Examination Guidelines: A complete evaluation includes B-mode imaging, spectral Doppler, color Doppler, and power Doppler as needed of all accessible portions of each vessel. Bilateral testing is considered an integral part of a complete examination. Limited examinations for reoccurring indications may be performed as noted.  Right Carotid Findings: +----------+--------+--------+--------+------------------+--------+           PSV cm/sEDV cm/sStenosisPlaque DescriptionComments +----------+--------+--------+--------+------------------+--------+ CCA Prox  71      0                                          +----------+--------+--------+--------+------------------+--------+ CCA Distal51      0                                          +----------+--------+--------+--------+------------------+--------+ ICA Prox                  Occluded                           +----------+--------+--------+--------+------------------+--------+ ICA Distal                Occluded                           +----------+--------+--------+--------+------------------+--------+ ECA       144     3                                          +----------+--------+--------+--------+------------------+--------+ +----------+--------+-------+----------------+-------------------+           PSV cm/sEDV cmsDescribe        Arm Pressure (mmHG) +----------+--------+-------+----------------+-------------------+ [id number]            Multiphasic, WNL                     +----------+--------+-------+----------------+-------------------+ +---------+--------+--+--------+--+---------+ VertebralPSV cm/s58EDV cm/s15Antegrade +---------+--------+--+--------+--+---------+  Left Carotid Findings: +----------+--------+--------+--------+------------------+------------------+           PSV cm/sEDV cm/sStenosisPlaque DescriptionComments           +----------+--------+--------+--------+------------------+------------------+ CCA Prox  114     23                                intimal thickening +----------+--------+--------+--------+------------------+------------------+ CCA Distal78      16                                intimal thickening +----------+--------+--------+--------+------------------+------------------+ ICA Prox  85      19              focal and calcific                   +----------+--------+--------+--------+------------------+------------------+ ICA Distal77      23                                                   +----------+--------+--------+--------+------------------+------------------+ ECA       92      0                                                    +----------+--------+--------+--------+------------------+------------------+ +----------+--------+--------+----------------+-------------------+  PSV cm/sEDV cm/sDescribe        Arm Pressure (mmHG) +----------+--------+--------+----------------+-------------------+ [id number]             Multiphasic, WNL                    +----------+--------+--------+----------------+-------------------+ +---------+--------+--+--------+-+---------+ VertebralPSV cm/s62EDV cm/s6Antegrade +---------+--------+--+--------+-+---------+   Summary: Right Carotid: Evidence consistent with a total occlusion of the right ICA. Left Carotid: The extracranial vessels were near-normal with only minimal wall               thickening or plaque. Vertebrals:  Bilateral  vertebral arteries demonstrate antegrade flow. Subclavians: Normal flow hemodynamics were seen in bilateral subclavian              arteries. *See table(s) above for measurements and observations.  Electronically signed by Runell Countryman on 08/11/2023 at 4:32:35 PM.    Final    ECHOCARDIOGRAM COMPLETE Result Date: 08/11/2023    ECHOCARDIOGRAM REPORT   Patient Name:   Alicia Wilkerson Date of Exam: 08/11/2023 Medical Rec #:  [id number]       Height:       63.0 in Accession #:    [id number]      Weight:       123.7 lb Date of Birth:  [birth year]       BSA:          1.576 m Patient Age:    73 years        BP:           129/79 mmHg Patient Gender: F               HR:           60 bpm. Exam Location:  Inpatient Procedure: 2D Echo, Cardiac Doppler and Color Doppler (Both Spectral and Color            Flow Doppler were utilized during procedure). Indications:    CVA  History:        Patient has prior history of Echocardiogram examinations, most                 recent 11/14/2018.  Sonographer:    Griselda Lederer Referring Phys: [id number] CORTNEY E DE LA TORRE IMPRESSIONS  1. Left ventricular ejection fraction, by estimation, is 60 to 65%. The left ventricle has normal function. The left ventricle has no regional wall motion abnormalities. There is mild left ventricular hypertrophy. Left ventricular diastolic parameters are consistent with Grade I diastolic dysfunction (impaired relaxation). Consider evaluation for infiltrative cardiomyopathy. (I.E HCM, amyloid)  2. Right ventricular systolic function is normal. The right ventricular size is normal. Mildly increased right ventricular wall thickness.  3. Left atrial size was moderately dilated.  4. Small to moderate sized pericardial effusion. No signs of tamponade.  5. The mitral valve is normal in structure. No evidence of mitral valve regurgitation. No evidence of mitral stenosis.  6. The aortic valve is normal in structure. Aortic valve regurgitation is not visualized. No aortic  stenosis is present.  7. The inferior vena cava is normal in size with greater than 50% respiratory variability, suggesting right atrial pressure of 3 mmHg. FINDINGS  Left Ventricle: Left ventricular ejection fraction, by estimation, is 60 to 65%. The left ventricle has normal function. The left ventricle has no regional wall motion abnormalities. The left ventricular internal cavity size was normal in size. There is  mild left ventricular hypertrophy. Left ventricular diastolic parameters are consistent with Grade I diastolic  dysfunction (impaired relaxation). Right Ventricle: The right ventricular size is normal. Mildly increased right ventricular wall thickness. Right ventricular systolic function is normal. Left Atrium: Left atrial size was moderately dilated. Right Atrium: Right atrial size was normal in size. Pericardium: A moderately sized pericardial effusion is present. Mitral Valve: The mitral valve is normal in structure. No evidence of mitral valve regurgitation. No evidence of mitral valve stenosis. Tricuspid Valve: The tricuspid valve is normal in structure. Tricuspid valve regurgitation is not demonstrated. No evidence of tricuspid stenosis. Aortic Valve: The aortic valve is normal in structure. There is mild aortic valve annular calcification. Aortic valve regurgitation is not visualized. No aortic stenosis is present. Pulmonic Valve: The pulmonic valve was normal in structure. Pulmonic valve regurgitation is not visualized. No evidence of pulmonic stenosis. Aorta: The aortic root is normal in size and structure. Venous: The inferior vena cava is normal in size with greater than 50% respiratory variability, suggesting right atrial pressure of 3 mmHg. IAS/Shunts: No atrial level shunt detected by color flow Doppler.  LEFT VENTRICLE PLAX 2D LVIDd:         3.00 cm     Diastology LVIDs:         1.70 cm     LV e' medial:    4.35 cm/s LV PW:         1.10 cm     LV E/e' medial:  16.3 LV IVS:        1.30 cm      LV e' lateral:   5.55 cm/s LVOT diam:     1.90 cm     LV E/e' lateral: 12.8 LVOT Area:     2.84 cm  LV Volumes (MOD) LV vol d, MOD A2C: 38.2 ml LV vol d, MOD A4C: 37.5 ml LV vol s, MOD A2C: 13.2 ml LV vol s, MOD A4C: 12.0 ml LV SV MOD A2C:     25.0 ml LV SV MOD A4C:     37.5 ml LV SV MOD BP:      25.0 ml RIGHT VENTRICLE             IVC RV Basal diam:  2.80 cm     IVC diam: 0.90 cm RV S prime:     25.60 cm/s TAPSE (M-mode): 1.4 cm LEFT ATRIUM           Index LA diam:      3.90 cm 2.47 cm/m LA Vol (A4C): 65.8 ml 41.74 ml/m   AORTA Ao Root diam: 3.00 cm Ao Asc diam:  3.20 cm MITRAL VALVE               TRICUSPID VALVE MV Area (PHT): 2.90 cm    TR Peak grad:   19.5 mmHg MV Decel Time: 262 msec    TR Vmax:        221.00 cm/s MV E velocity: 71.10 cm/s MV A velocity: 71.80 cm/s  SHUNTS MV E/A ratio:  0.99        Systemic Diam: 1.90 cm Aditya Sabharwal Electronically signed by Alwin Baars Signature Date/Time: 08/11/2023/9:19:13 AM    Final    CT HEAD WO CONTRAST ( ) Result Date: 08/10/2023 CLINICAL DATA:  Headache. EXAM: CT HEAD WITHOUT CONTRAST TECHNIQUE: Contiguous axial images were obtained from the base of the skull through the vertex without intravenous contrast. RADIATION DOSE REDUCTION: This exam was performed according to the departmental dose-optimization program which includes automated exposure control, adjustment of the mA and/or kV according to patient  size and/or use of iterative reconstruction technique. COMPARISON:  August 09, 2023, MR head dated August 10, 2023 FINDINGS: Brain: There is generalized cerebral atrophy with widening of the extra-axial spaces and ventricular dilatation. There are areas of decreased attenuation within the white matter tracts of the supratentorial brain, consistent with microvascular disease changes. The patchy small volume acute right PCA territory infarct seen on the earlier MR head (August 10, 2023) is not clearly visualized on the current plain brain CT. Vascular:  Marked severity bilateral cavernous carotid artery calcification is noted. Skull: Normal. Negative for fracture or focal lesion. Sinuses/Orbits: There is mild left ethmoid sinus and mild sphenoid sinus mucosal thickening. Other: None. IMPRESSION: 1. Generalized cerebral atrophy with widening of the extra-axial spaces and ventricular dilatation. 2. The patchy small volume acute right PCA territory infarct seen on the earlier MR head is not clearly visualized on the current plain brain CT. 3. Mild left ethmoid sinus and mild sphenoid sinus mucosal thickening. Electronically Signed   By: Virgle Grime M.D.   On: 08/10/2023 18:28      Blood pressure 128/67, pulse 65, temperature 98.1 F (36.7 C), temperature source Oral, resp. rate 18, height 5' 3 (1.6 m), weight 56.1 kg, last menstrual period 02/24/2002, SpO2 94%.  Medical Problem List and Plan: 1. Functional deficits secondary to multiple ischemic infarcts right MCA/PCA territory.  Plan 30-day heart monitor at discharge  -patient may *** shower  -ELOS/Goals: *** 2.  Antithrombotics: -DVT/anticoagulation:  Pharmaceutical: Lovenox   -antiplatelet therapy: Aspirin  81 mg daily and Plavix 85 mg daily x 3 months then Plavix alone 3. Pain Management: *** 4. Mood/Behavior/Sleep: Lexapro  10 mg nightly, melatonin 3 mg nightly as needed, Klonopin  0.5 mg twice daily as needed anxiety  -antipsychotic agents: N/A 5. Neuropsych/cognition: This patient is capable of making decisions on her own behalf. 6. Skin/Wound Care: Routine skin checks 7. Fluids/Electrolytes/Nutrition: Routine in and outs with follow-up chemistries 8.  Hyperlipidemia.  Crestor  9.  UTI/sepsis.  IV Rocephin completed. 10.  Episode of PSVT 08/11/2023.  Valsalva maneuvers and PSVT terminated within 3 seconds.  No plan for current beta-blocker due to resting heart rate in the 50s and low 60s.. 11.  Hyperglycemia.  Initial glucose 307.  Hemoglobin A1c 3.7 not consistent with diabetes.   Hyperglycemia likely due to stress from CVA and UTI 12.  CAD.  Low-dose aspirin  prior to admission.  Everlyn Hockey Alston Berrie, PA-C 08/12/2023

## 2023-08-12 NOTE — Progress Notes (Signed)
 Physical Therapy Treatment Patient Details Name: Alicia Wilkerson MRN: [id number] DOB: 1950-03-27 Today's Date: 08/12/2023   History of Present Illness Patient is a 73 y.o. female presenting 08/09/23 with AMS and multiple recent falls. MRI brain with patchy small volume acute right PCA territory infarcts, with a few additional punctate infarcts within the right frontal lobe. Additional work up showing UTI.  PMH significant for HTN, HLD, osteoporosis, cervical intraepithelial neoplasia II.    PT Comments  The pt is demonstrating good functional progress in regards to her balance and independence with mobility. However, she continues to display deficits in balance that place her at risk for falls. Session focused on challenging her balance through dynamic gait challenges, standing exercises that briefly place her in SLS repeatedly, and static tandem stance. She required up to minA to recover her balance when she would sway with any of these challenges. Will continue to follow acutely.     If plan is discharge home, recommend the following: A little help with bathing/dressing/bathroom;Assistance with cooking/housework;Assist for transportation;Help with stairs or ramp for entrance;Direct supervision/assist for medications management;Supervision due to cognitive status;A little help with walking and/or transfers;Direct supervision/assist for financial management   Can travel by private vehicle        Equipment Recommendations   (TBA/defer to next venue)    Recommendations for Other Services Rehab consult     Precautions / Restrictions Precautions Precautions: Fall Recall of Precautions/Restrictions: Impaired Precaution/Restrictions Comments: watch HR elevation Restrictions Weight Bearing Restrictions Per Provider Order: No     Mobility  Bed Mobility               General bed mobility comments: Pt standing in room upon arrival. Pt sitting in chair end of session     Transfers Overall transfer level: Needs assistance Equipment used: None Transfers: Sit to/from Stand Sit to Stand: Contact guard assist           General transfer comment: CGA for transferring stand to sit to chair. Pt already standing in room upon PT arrival    Ambulation/Gait Ambulation/Gait assistance: Min assist, Contact guard assist Gait Distance (Feet): 190 Feet Assistive device: None Gait Pattern/deviations: Step-through pattern, Decreased stride length, Drifts right/left, Narrow base of support Gait velocity: decr Gait velocity interpretation: <1.8 ft/sec, indicate of risk for recurrent falls   General Gait Details: Pt ambulates with intermittent narrow feet placement followed by a lateral sway/LOB, needing up to minA to recover. Cues provided to widen stance to try to improve posture. Pt slows gait and intermittently sways when turning head L <> R or nodding head up <> down. Able to change speeds without LOB. Sway noted with high knee stepping to simulate stepping over obstacles   Stairs             Wheelchair Mobility     Tilt Bed    Modified Rankin (Stroke Patients Only) Modified Rankin (Stroke Patients Only) Pre-Morbid Rankin Score: No symptoms Modified Rankin: Moderately severe disability     Balance Overall balance assessment: Needs assistance Sitting-balance support: Feet supported, No upper extremity supported Sitting balance-Leahy Scale: Good     Standing balance support: During functional activity, Single extremity supported, No upper extremity supported Standing balance-Leahy Scale: Fair Standing balance comment: Able to stand statically with CGA, but needs up to minA to recover LOB bouts when ambulating or when performing static balance challenges         Rhomberg - Eyes Opened: 10 (seconds bil with no UE support, minA  intermittently for balance) Rhomberg - Eyes Closed: 10 (seconds with L in lead with no UE support, minA intermittently  for balance)                Communication Communication Communication: No apparent difficulties (this session)  Cognition Arousal: Alert Behavior During Therapy: WFL for tasks assessed/performed   PT - Cognitive impairments: Awareness, Attention, Problem solving, Safety/Judgement, Memory                       PT - Cognition Comments: When family or therapist would mention any cognitive concerns, pt tended to have an explanation or excuse, potentially in some denial. Following commands: Intact      Cueing Cueing Techniques: Verbal cues  Exercises General Exercises - Lower Extremity Hip ABduction/ADduction: AROM, Strengthening, Both, 10 reps, Standing (with intermittent x1 finger support on table, otherwise no UE support, minA for balance) Hip Flexion/Marching: AROM, Strengthening, Both, 10 reps, Standing (with intermittent x1 finger support on table, otherwise no UE support, minA for balance) Other Exercises Other Exercises: bil hamstring curls, with intermittent x1 finger support on table, otherwise no UE support, minA for balance Other Exercises: tandem stance with eyes open bil, eyes closed with L in lead, no UE support, minA for balance, up to ~10 sec each rep    General Comments General comments (skin integrity, edema, etc.): HR stable up to low 100s today      Pertinent Vitals/Pain Pain Assessment Pain Assessment: Faces Faces Pain Scale: No hurt Pain Intervention(s): Monitored during session    Home Living                          Prior Function            PT Goals (current goals can now be found in the care plan section) Acute Rehab PT Goals Patient Stated Goal: recover independence PT Goal Formulation: With patient/family Time For Goal Achievement: 08/24/23 Potential to Achieve Goals: Good Progress towards PT goals: Progressing toward goals    Frequency    Min 3X/week      PT Plan      Co-evaluation               AM-PAC PT 6 Clicks Mobility   Outcome Measure  Help needed turning from your back to your side while in a flat bed without using bedrails?: A Little Help needed moving from lying on your back to sitting on the side of a flat bed without using bedrails?: A Little Help needed moving to and from a bed to a chair (including a wheelchair)?: A Little Help needed standing up from a chair using your arms (e.g., wheelchair or bedside chair)?: A Little Help needed to walk in hospital room?: A Little Help needed climbing 3-5 steps with a railing? : A Lot 6 Click Score: 17    End of Session   Activity Tolerance: Patient tolerated treatment well Patient left: with call bell/phone within reach;with family/visitor present;in chair;with chair alarm set Nurse Communication: Mobility status PT Visit Diagnosis: Muscle weakness (generalized) (M62.81);Difficulty in walking, not elsewhere classified (R26.2);Other symptoms and signs involving the nervous system (R29.898);Unsteadiness on feet (R26.81);Other abnormalities of gait and mobility (R26.89)     Time: 1308-6578 PT Time Calculation (min) (ACUTE ONLY): 17 min  Charges:    $Neuromuscular Re-education: 8-22 mins PT General Charges $$ ACUTE PT VISIT: 1 Visit  Vernida Goodie, PT, DPT Acute Rehabilitation Services  Office: [phone]    Ellyn Hack 08/12/2023, 5:06 PM

## 2023-08-12 NOTE — Progress Notes (Signed)
 Inpatient Rehab Admissions Coordinator:   Awaiting determination from [facility] regarding CIR prior auth request.    Loye Rumble, PT, DPT Admissions Coordinator [phone] 08/12/23  9:23 AM

## 2023-08-12 NOTE — Assessment & Plan Note (Signed)
 08-12-2023 seen on MRA head/neck and carotid U/S. No intervention required.

## 2023-08-12 NOTE — Progress Notes (Signed)
 PROGRESS NOTE    Alicia Wilkerson  WUJ:[id number] DOB: 1950-10-07 DOA: 08/09/2023 PCP: Alicia Dauphin, MD  Subjective: Pt seen and examined. Met with pt and her sister Alicia Wilkerson at bedside.  Awaiting CIR. Stable overnight.   Hospital Course: HPI: Alicia Wilkerson is a 73 y.o. female with medical history significant for essential pretension, hyperlipidemia, who is admitted to [facility] on 08/09/2023 with acute encephalopathy after presenting from home to [facility] ED complaining of altered mental status.    In setting of the patient's altered mental status, history is provided by patient, as well as her daughter, in addition to my discussions with the EDP ED chart review.   Daughter conveys that the patient has been altered, confused over the last 1 to 2 days.  Daughter, who lives in the state of ER, first noted the patient to be confused on Saturday, 08/08/2023, with last known well occurring on Friday, 08/07/2023.  On Saturday, 08/08/2023, daughter also felt, via FaceTime evaluation, the patient was exhibiting some evidence of facial droop.   The patient reportedly experienced a ground-level fall on Saturday, 08/08/2023, in which she was leaning forward to pick something up, and tripped, resulting in her hitting her head on the floor, without any associated loss of consciousness.  She is on daily baby aspirin  as an outpatient in the absence of any additional blood thinners at home.  Additionally, the patient reportedly fell out of her bed later on Saturday, 08/08/2023, which was also associated with no loss of consciousness.   No known history of diabetes .   Given ongoing confusion exhibited by the patient, daughter arrived from [place]  earlier today, and initially brought the patient to a local urgent care, before receiving recommendation to present to the ED for further evaluation management of the above.    Significant Events: Admitted 08/09/2023 for acute metabolic encephalopathy due to  UTI   Admission Labs: WBC 16.7, HgB 12.6, plt 335 Na 134, K 3.4, CO2 of 23, BUN 14, Scr 0.94, glu 309 UA negative nitrite, Large LE, rare bacteria, WBC >50  Admission Imaging Studies: CT head/face No acute intracranial abnormality. 2.  No acute displaced facial fracture CXR No acute findings  CT head/angio Occlusion of the right ICA just distal to the bifurcation, age indeterminate, but presumably acute given patient's symptoms. Right ICA remains occluded to the terminus. Revascularization of the right MCA via collateral flow across the circle-of-Willis. 2. Fetal type origin of the right PCA with severe stenosis versus  occlusion at the origin of the right PCOM. Right PCA attenuated but patent distally. 3. Mild atheromatous change about the left carotid bulb and left carotid siphon without hemodynamically significant stenosis. 4. Small area of reticulonodular tree-in-bud densities within the left upper lobe, likely mild infection/small airways disease. 5.  Aortic Atherosclerosis  Significant Labs:   Significant Imaging Studies: MRI brain Patchy small volume acute ischemic nonhemorrhagic right PCA territory infarcts as above, with a few additional punctate infarcts within the right frontal lobe. 2. Underlying mildly advanced cerebral atrophy CT head Generalized cerebral atrophy with widening of the extra-axial spaces and ventricular dilatation. 2. The patchy small volume acute right PCA territory infarct seen on the earlier MR head is not clearly visualized on the current plain brain CT. 3. Mild left ethmoid sinus and mild sphenoid sinus mucosal thickening.  Antibiotic Therapy: Anti-infectives (From admission, onward)    Start     Dose/Rate Route Frequency Ordered Stop   08/10/23 1400  cefTRIAXone (ROCEPHIN)  1 g in sodium chloride  0.9 % 100 mL IVPB        1 g 200 mL/hr over 30 Minutes Intravenous Every 24 hours 08/09/23 2355     08/09/23 2215  cefTRIAXone (ROCEPHIN) 1 g in sodium chloride   0.9 % 100 mL IVPB        1 g 200 mL/hr over 30 Minutes Intravenous  Once 08/09/23 2205 08/09/23 2309       Procedures:   Consultants: neurology    Assessment and Plan: * Acute ischemic stroke (HCC) 08-11-2023 MRI brain positive for ischemic stroke. Echo LVEF 65%. No shunts. No thrombus.  Pt recommended CIR by PT/OT.  CIR has seen patient. Awaiting ins authorization. Discussed with pt and her sister that family may have to stay with her at her home after discharge from CIR or pt may need to go and stay with relatives after CIR.  On crestor  20 mg daily.  Lovenox  for DVT prophylaxis. On ASA and plavix.  08-12-2023 awaiting ins authorization.  Acute cystitis 08-11-2023 urine cx growing less than 10K CFU. She will complete 3 days of IV rocephin. Today is Day #2.   08-12-2023 completed 3 days of IV rocephin. UTI resolved.  PSVT (paroxysmal supraventricular tachycardia) (HCC) 08-11-2023 pt had episode of PSVT in my presence. HR 120s. Narrow complex tachycardia. I had pt performed Valsalva maneuver. PSVT terminated within about 3 secs.  Will give some po kcl and get her serum K above 4.5. also some mag oxide to get her serum Mg above 2.0. repeat Mg and BMP in AM.  Cannot start betablockers due to resting HR in the high 50s and low 60s. SBP in the upper 90s.  08-12-2023 stable. Resting HR in high 50 - low 60s. Unable to start any AVN blockers.  Hyperglycemia 08-11-2023 A1C 3.7%. not consistent with diabetes.  Hyperglycemia likely due to stress from acute CVA and UTI.  08-12-2023 stable.  Hypokalemia-resolved as of 08/11/2023 08-11-2023 treated on admission. Now resolved.  Severe sepsis (HCC)-resolved as of 08/12/2023 08-11-2023 present on admission. WBC 16K, HR 138. + UA for infection. Now resolved.  ICAO (internal carotid artery occlusion), right 08-12-2023 seen on MRA head/neck and carotid U/S. No intervention required.  CAD (coronary artery disease) 08-11-2023 stable. On ASA,  crestor .  08-12-2023 stable.   HLD (hyperlipidemia) 08-11-2023 on crestor  20 mg at bedtime.  Lipid panel shows  Lab Results  Component Value Date/Time   CHOL 128 08/11/2023 06:13 AM   TRIG 59 08/11/2023 06:13 AM   HDL 52 08/11/2023 06:13 AM   CHOLHDL 2.5 08/11/2023 06:13 AM   VLDL 12 08/11/2023 06:13 AM   LDLCALC 64 08/11/2023 06:13 AM   08-12-2023 stable.   DVT prophylaxis: enoxaparin  (LOVENOX ) injection 40 mg Start: 08/10/23 2200 SCDs Start: 08/09/23 2351    Code Status: Full Code Family Communication: discussed with pt's sister Alicia Wilkerson at bedside Disposition Plan: CIR Reason for continuing need for hospitalization: medically stable for DC.  Objective: Vitals:   08/11/23 1923 08/11/23 2306 08/12/23 0430 08/12/23 0919  BP: 128/63 128/71 117/62 132/62  Pulse: 63 (!) 104  (!) 51  Resp: 19 17 17 16   Temp: 98.4 F (36.9 C) 99.7 F (37.6 C) 98.8 F (37.1 C) 97.8 F (36.6 C)  TempSrc: Oral Oral Oral Oral  SpO2: 96% 93%  95%  Weight:      Height:        Intake/Output Summary (Last 24 hours) at 08/12/2023 1151 Last data filed at 08/12/2023 1138 Gross  per 24 hour  Intake 320 ml  Output --  Net 320 ml   Filed Weights   08/09/23 1745 08/11/23 0500  Weight: 55.3 kg 56.1 kg    Examination:  Physical Exam Vitals and nursing note reviewed.  Constitutional:      General: She is not in acute distress.    Appearance: She is normal weight. She is not toxic-appearing or diaphoretic.  HENT:     Head: Normocephalic and atraumatic.     Nose: Nose normal.   Eyes:     General: No scleral icterus.   Cardiovascular:     Rate and Rhythm: Normal rate and regular rhythm.  Pulmonary:     Effort: Pulmonary effort is normal. No respiratory distress.     Breath sounds: Normal breath sounds. No wheezing.  Abdominal:     General: Abdomen is flat. There is no distension.     Palpations: Abdomen is soft.   Musculoskeletal:     Right lower leg: No edema.     Left lower leg:  No edema.   Skin:    General: Skin is warm and dry.     Capillary Refill: Capillary refill takes less than 2 seconds.   Neurological:     Mental Status: She is alert and oriented to person, place, and time.     Data Reviewed: I have personally reviewed following labs and imaging studies  CBC: Recent Labs  Lab 08/09/23 1747 08/09/23 1801 08/10/23 0542 08/11/23 0613 08/12/23 0507  WBC 16.7*  --  12.8* 11.6* 10.6*  NEUTROABS 14.1*  --  9.3*  --   --   HGB 12.6 11.6* 12.0 12.0 12.4  HCT 36.2 34.0* 33.5* 34.0* 35.6*  MCV 100.0  --  97.1 97.7 98.9  PLT 335  --  300 346 322   Basic Metabolic Panel: Recent Labs  Lab 08/09/23 1747 08/09/23 1801 08/10/23 0542 08/11/23 0613 08/12/23 0507  NA 134* 135 137 135 137  K 3.4* 3.8 4.4 3.6 4.3  CL 98 97* 102 101 105  CO2 23  --  26 25 26   GLUCOSE 309* 300* 105* 106* 102*  BUN 14 16 10 9 9   CREATININE 0.94 0.80 0.63 0.68 0.64  CALCIUM  9.7  --  9.0 8.7* 9.0  MG  --   --  1.9  --  2.1   GFR: Estimated Creatinine Clearance: 53.4 mL/min (by C-G formula based on SCr of 0.64 mg/dL). Liver Function Tests: Recent Labs  Lab 08/09/23 1747 08/10/23 0542 08/10/23 1004  AST 24 18  --   ALT 17 16  --   ALKPHOS 58 45  --   BILITOT 1.6* 1.6* 1.5*  PROT 6.9 6.0*  --   ALBUMIN 3.5 3.0*  --    Coagulation Profile: Recent Labs  Lab 08/09/23 1747  INR 1.0   HbA1C: Recent Labs    08/10/23 1004  HGBA1C 3.7*   CBG: Recent Labs  Lab 08/09/23 1851 08/10/23 0937 08/10/23 1204  GLUCAP 205* 108* 96   Lipid Profile: Recent Labs    08/11/23 0613  CHOL 128  HDL 52  LDLCALC 64  TRIG 59  CHOLHDL 2.5   Thyroid  Function Tests: Recent Labs    08/10/23 1004  TSH 1.594   Anemia Panel: Recent Labs    08/10/23 1004  VITAMINB12 271   Sepsis Labs: Recent Labs  Lab 08/10/23 1026  LATICACIDVEN 0.9    Recent Results (from the past 240 hours)  Culture, OB Urine  Status: Abnormal   Collection Time: 08/09/23  9:00 PM    Specimen: Urine, Random  Result Value Ref Range Status   Specimen Description URINE, RANDOM  Final   Special Requests NONE  Final   Culture (A)  Final    <10,000 COLONIES/mL INSIGNIFICANT GROWTH NO GROUP B STREP (S.AGALACTIAE) ISOLATED Performed at [facility], [address] [place], [place] [address]    Report Status 08/11/2023 FINAL  Final  Culture, blood (Routine X 2) w Reflex to ID Panel     Status: None (Preliminary result)   Collection Time: 08/10/23  5:52 AM   Specimen: BLOOD  Result Value Ref Range Status   Specimen Description BLOOD LEFT ANTECUBITAL  Final   Special Requests   Final    BOTTLES DRAWN AEROBIC ONLY Blood Culture results may not be optimal due to an inadequate volume of blood received in culture bottles   Culture   Final    NO GROWTH 2 DAYS Performed at [facility], [address], [place], [place] [address]    Report Status PENDING  Incomplete  Culture, blood (Routine X 2) w Reflex to ID Panel     Status: None (Preliminary result)   Collection Time: 08/10/23  5:57 AM   Specimen: BLOOD  Result Value Ref Range Status   Specimen Description BLOOD LEFT ANTECUBITAL  Final   Special Requests   Final    BOTTLES DRAWN AEROBIC ONLY Blood Culture results may not be optimal due to an inadequate volume of blood received in culture bottles   Culture   Final    NO GROWTH 2 DAYS Performed at [facility], [address], [place], [place] [address]    Report Status PENDING  Incomplete     Radiology Studies: VAS US  CAROTID Result Date: 08/11/2023 Carotid Arterial Duplex Study Patient Name:  DEALVA LAFOY  Date of Exam:   08/11/2023 Medical Rec #: [id number]        Accession #:    [id number] Date of Birth: 12-25-1950        Patient Gender: F Patient Age:   78 years Exam Location:  [facility] Procedure:      VAS US  CAROTID Referring Phys: Roxan Copes --------------------------------------------------------------------------------  Risk  Factors: Hypertension, hyperlipidemia, coronary artery disease. Performing Technologist: Arlyce Berger RVT, RDMS  Examination Guidelines: A complete evaluation includes B-mode imaging, spectral Doppler, color Doppler, and power Doppler as needed of all accessible portions of each vessel. Bilateral testing is considered an integral part of a complete examination. Limited examinations for reoccurring indications may be performed as noted.  Right Carotid Findings: +----------+--------+--------+--------+------------------+--------+           PSV cm/sEDV cm/sStenosisPlaque DescriptionComments +----------+--------+--------+--------+------------------+--------+ CCA Prox  71      0                                          +----------+--------+--------+--------+------------------+--------+ CCA Distal51      0                                          +----------+--------+--------+--------+------------------+--------+ ICA Prox                  Occluded                           +----------+--------+--------+--------+------------------+--------+  ICA Distal                Occluded                           +----------+--------+--------+--------+------------------+--------+ ECA       144     3                                          +----------+--------+--------+--------+------------------+--------+ +----------+--------+-------+----------------+-------------------+           PSV cm/sEDV cmsDescribe        Arm Pressure (mmHG) +----------+--------+-------+----------------+-------------------+ [id number]            Multiphasic, WNL                    +----------+--------+-------+----------------+-------------------+ +---------+--------+--+--------+--+---------+ VertebralPSV cm/s58EDV cm/s15Antegrade +---------+--------+--+--------+--+---------+  Left Carotid Findings: +----------+--------+--------+--------+------------------+------------------+           PSV cm/sEDV  cm/sStenosisPlaque DescriptionComments           +----------+--------+--------+--------+------------------+------------------+ CCA Prox  114     23                                intimal thickening +----------+--------+--------+--------+------------------+------------------+ CCA Distal78      16                                intimal thickening +----------+--------+--------+--------+------------------+------------------+ ICA Prox  85      19              focal and calcific                   +----------+--------+--------+--------+------------------+------------------+ ICA Distal77      23                                                   +----------+--------+--------+--------+------------------+------------------+ ECA       92      0                                                    +----------+--------+--------+--------+------------------+------------------+ +----------+--------+--------+----------------+-------------------+           PSV cm/sEDV cm/sDescribe        Arm Pressure (mmHG) +----------+--------+--------+----------------+-------------------+ [id number]             Multiphasic, WNL                    +----------+--------+--------+----------------+-------------------+ +---------+--------+--+--------+-+---------+ VertebralPSV cm/s62EDV cm/s6Antegrade +---------+--------+--+--------+-+---------+   Summary: Right Carotid: Evidence consistent with a total occlusion of the right ICA. Left Carotid: The extracranial vessels were near-normal with only minimal wall               thickening or plaque. Vertebrals:  Bilateral vertebral arteries demonstrate antegrade flow. Subclavians: Normal flow hemodynamics were seen in bilateral subclavian              arteries. *See table(s) above for measurements and observations.  Electronically signed by  Runell Countryman on 08/11/2023 at 4:32:35 PM.    Final    ECHOCARDIOGRAM COMPLETE Result Date: 08/11/2023     ECHOCARDIOGRAM REPORT   Patient Name:   POLLIE POMA Date of Exam: 08/11/2023 Medical Rec #:  [id number]       Height:       63.0 in Accession #:    [id number]      Weight:       123.7 lb Date of Birth:  1950-04-16       BSA:          1.576 m Patient Age:    73 years        BP:           129/79 mmHg Patient Gender: F               HR:           60 bpm. Exam Location:  Inpatient Procedure: 2D Echo, Cardiac Doppler and Color Doppler (Both Spectral and Color            Flow Doppler were utilized during procedure). Indications:    CVA  History:        Patient has prior history of Echocardiogram examinations, most                 recent 11/14/2018.  Sonographer:    Griselda Lederer Referring Phys: [id number] CORTNEY E DE LA TORRE IMPRESSIONS  1. Left ventricular ejection fraction, by estimation, is 60 to 65%. The left ventricle has normal function. The left ventricle has no regional wall motion abnormalities. There is mild left ventricular hypertrophy. Left ventricular diastolic parameters are consistent with Grade I diastolic dysfunction (impaired relaxation). Consider evaluation for infiltrative cardiomyopathy. (I.E HCM, amyloid)  2. Right ventricular systolic function is normal. The right ventricular size is normal. Mildly increased right ventricular wall thickness.  3. Left atrial size was moderately dilated.  4. Small to moderate sized pericardial effusion. No signs of tamponade.  5. The mitral valve is normal in structure. No evidence of mitral valve regurgitation. No evidence of mitral stenosis.  6. The aortic valve is normal in structure. Aortic valve regurgitation is not visualized. No aortic stenosis is present.  7. The inferior vena cava is normal in size with greater than 50% respiratory variability, suggesting right atrial pressure of 3 mmHg. FINDINGS  Left Ventricle: Left ventricular ejection fraction, by estimation, is 60 to 65%. The left ventricle has normal function. The left ventricle has no regional wall  motion abnormalities. The left ventricular internal cavity size was normal in size. There is  mild left ventricular hypertrophy. Left ventricular diastolic parameters are consistent with Grade I diastolic dysfunction (impaired relaxation). Right Ventricle: The right ventricular size is normal. Mildly increased right ventricular wall thickness. Right ventricular systolic function is normal. Left Atrium: Left atrial size was moderately dilated. Right Atrium: Right atrial size was normal in size. Pericardium: A moderately sized pericardial effusion is present. Mitral Valve: The mitral valve is normal in structure. No evidence of mitral valve regurgitation. No evidence of mitral valve stenosis. Tricuspid Valve: The tricuspid valve is normal in structure. Tricuspid valve regurgitation is not demonstrated. No evidence of tricuspid stenosis. Aortic Valve: The aortic valve is normal in structure. There is mild aortic valve annular calcification. Aortic valve regurgitation is not visualized. No aortic stenosis is present. Pulmonic Valve: The pulmonic valve was normal in structure. Pulmonic valve regurgitation is not visualized. No evidence of pulmonic stenosis. Aorta: The aortic  root is normal in size and structure. Venous: The inferior vena cava is normal in size with greater than 50% respiratory variability, suggesting right atrial pressure of 3 mmHg. IAS/Shunts: No atrial level shunt detected by color flow Doppler.  LEFT VENTRICLE PLAX 2D LVIDd:         3.00 cm     Diastology LVIDs:         1.70 cm     LV e' medial:    4.35 cm/s LV PW:         1.10 cm     LV E/e' medial:  16.3 LV IVS:        1.30 cm     LV e' lateral:   5.55 cm/s LVOT diam:     1.90 cm     LV E/e' lateral: 12.8 LVOT Area:     2.84 cm  LV Volumes (MOD) LV vol d, MOD A2C: 38.2 ml LV vol d, MOD A4C: 37.5 ml LV vol s, MOD A2C: 13.2 ml LV vol s, MOD A4C: 12.0 ml LV SV MOD A2C:     25.0 ml LV SV MOD A4C:     37.5 ml LV SV MOD BP:      25.0 ml RIGHT VENTRICLE              IVC RV Basal diam:  2.80 cm     IVC diam: 0.90 cm RV S prime:     25.60 cm/s TAPSE (M-mode): 1.4 cm LEFT ATRIUM           Index LA diam:      3.90 cm 2.47 cm/m LA Vol (A4C): 65.8 ml 41.74 ml/m   AORTA Ao Root diam: 3.00 cm Ao Asc diam:  3.20 cm MITRAL VALVE               TRICUSPID VALVE MV Area (PHT): 2.90 cm    TR Peak grad:   19.5 mmHg MV Decel Time: 262 msec    TR Vmax:        221.00 cm/s MV E velocity: 71.10 cm/s MV A velocity: 71.80 cm/s  SHUNTS MV E/A ratio:  0.99        Systemic Diam: 1.90 cm Aditya Sabharwal Electronically signed by Alwin Baars Signature Date/Time: 08/11/2023/9:19:13 AM    Final    CT HEAD WO CONTRAST ( ) Result Date: 08/10/2023 CLINICAL DATA:  Headache. EXAM: CT HEAD WITHOUT CONTRAST TECHNIQUE: Contiguous axial images were obtained from the base of the skull through the vertex without intravenous contrast. RADIATION DOSE REDUCTION: This exam was performed according to the departmental dose-optimization program which includes automated exposure control, adjustment of the mA and/or kV according to patient size and/or use of iterative reconstruction technique. COMPARISON:  August 09, 2023, MR head dated August 10, 2023 FINDINGS: Brain: There is generalized cerebral atrophy with widening of the extra-axial spaces and ventricular dilatation. There are areas of decreased attenuation within the white matter tracts of the supratentorial brain, consistent with microvascular disease changes. The patchy small volume acute right PCA territory infarct seen on the earlier MR head (August 10, 2023) is not clearly visualized on the current plain brain CT. Vascular: Marked severity bilateral cavernous carotid artery calcification is noted. Skull: Normal. Negative for fracture or focal lesion. Sinuses/Orbits: There is mild left ethmoid sinus and mild sphenoid sinus mucosal thickening. Other: None. IMPRESSION: 1. Generalized cerebral atrophy with widening of the extra-axial spaces and  ventricular dilatation. 2. The patchy small volume acute right PCA territory  infarct seen on the earlier MR head is not clearly visualized on the current plain brain CT. 3. Mild left ethmoid sinus and mild sphenoid sinus mucosal thickening. Electronically Signed   By: Virgle Grime M.D.   On: 08/10/2023 18:28    Scheduled Meds:  aspirin  EC  81 mg Oral Daily   clopidogrel  75 mg Oral Daily   enoxaparin  (LOVENOX ) injection  40 mg Subcutaneous Q24H   escitalopram   10 mg Oral QHS   magnesium oxide  400 mg Oral BID   rosuvastatin   20 mg Oral QHS   sodium chloride  flush  3 mL Intravenous Once   Continuous Infusions:   LOS: 2 days   Time spent: 55 minutes  Unk Garb, DO  [facility]  08/12/2023, 11:51 AM

## 2023-08-12 NOTE — Progress Notes (Signed)
 Inpatient Rehab Admissions Coordinator:   I did receive approval from [facility] for CIR admit.  I do not have a bed available today but will hopefully be able to transition to CIR tomorrow.  I updated pt's sister and pt aware per our discussion.  I will follow.   Loye Rumble, PT, DPT Admissions Coordinator [phone] 08/12/23  12:38 PM

## 2023-08-13 ENCOUNTER — Other Ambulatory Visit: Payer: Self-pay

## 2023-08-13 ENCOUNTER — Encounter (HOSPITAL_COMMUNITY): Payer: Self-pay | Admitting: Physical Medicine and Rehabilitation

## 2023-08-13 ENCOUNTER — Inpatient Hospital Stay (HOSPITAL_COMMUNITY)
Admission: AD | Admit: 2023-08-13 | Discharge: 2023-08-19 | DRG: 056 | Disposition: A | Discharge status: Home / Self Care | Source: Intra-hospital | Attending: Physical Medicine and Rehabilitation | Admitting: Physical Medicine and Rehabilitation

## 2023-08-13 ENCOUNTER — Other Ambulatory Visit: Payer: Self-pay | Admitting: Cardiology

## 2023-08-13 DIAGNOSIS — I69398 Other sequelae of cerebral infarction: Secondary | ICD-10-CM | POA: Diagnosis not present

## 2023-08-13 DIAGNOSIS — Z880 Allergy status to penicillin: Secondary | ICD-10-CM

## 2023-08-13 DIAGNOSIS — Z9081 Acquired absence of spleen: Secondary | ICD-10-CM

## 2023-08-13 DIAGNOSIS — I639 Cerebral infarction, unspecified: Principal | ICD-10-CM | POA: Diagnosis present

## 2023-08-13 DIAGNOSIS — I69354 Hemiplegia and hemiparesis following cerebral infarction affecting left non-dominant side: Secondary | ICD-10-CM

## 2023-08-13 DIAGNOSIS — I959 Hypotension, unspecified: Secondary | ICD-10-CM | POA: Diagnosis not present

## 2023-08-13 DIAGNOSIS — I69390 Apraxia following cerebral infarction: Secondary | ICD-10-CM | POA: Diagnosis not present

## 2023-08-13 DIAGNOSIS — E785 Hyperlipidemia, unspecified: Secondary | ICD-10-CM | POA: Diagnosis not present

## 2023-08-13 DIAGNOSIS — F419 Anxiety disorder, unspecified: Secondary | ICD-10-CM | POA: Diagnosis not present

## 2023-08-13 DIAGNOSIS — Z7982 Long term (current) use of aspirin: Secondary | ICD-10-CM

## 2023-08-13 DIAGNOSIS — A419 Sepsis, unspecified organism: Secondary | ICD-10-CM | POA: Diagnosis not present

## 2023-08-13 DIAGNOSIS — Z808 Family history of malignant neoplasm of other organs or systems: Secondary | ICD-10-CM

## 2023-08-13 DIAGNOSIS — Z85828 Personal history of other malignant neoplasm of skin: Secondary | ICD-10-CM

## 2023-08-13 DIAGNOSIS — I69319 Unspecified symptoms and signs involving cognitive functions following cerebral infarction: Secondary | ICD-10-CM | POA: Diagnosis not present

## 2023-08-13 DIAGNOSIS — I10 Essential (primary) hypertension: Secondary | ICD-10-CM | POA: Diagnosis present

## 2023-08-13 DIAGNOSIS — Z90710 Acquired absence of both cervix and uterus: Secondary | ICD-10-CM

## 2023-08-13 DIAGNOSIS — Z8249 Family history of ischemic heart disease and other diseases of the circulatory system: Secondary | ICD-10-CM

## 2023-08-13 DIAGNOSIS — I63511 Cerebral infarction due to unspecified occlusion or stenosis of right middle cerebral artery: Secondary | ICD-10-CM | POA: Diagnosis not present

## 2023-08-13 DIAGNOSIS — Z8262 Family history of osteoporosis: Secondary | ICD-10-CM

## 2023-08-13 DIAGNOSIS — I2510 Atherosclerotic heart disease of native coronary artery without angina pectoris: Secondary | ICD-10-CM | POA: Diagnosis present

## 2023-08-13 DIAGNOSIS — I6521 Occlusion and stenosis of right carotid artery: Secondary | ICD-10-CM | POA: Diagnosis not present

## 2023-08-13 DIAGNOSIS — R739 Hyperglycemia, unspecified: Secondary | ICD-10-CM | POA: Diagnosis not present

## 2023-08-13 DIAGNOSIS — Z882 Allergy status to sulfonamides status: Secondary | ICD-10-CM

## 2023-08-13 DIAGNOSIS — R42 Dizziness and giddiness: Secondary | ICD-10-CM | POA: Diagnosis not present

## 2023-08-13 DIAGNOSIS — Z7902 Long term (current) use of antithrombotics/antiplatelets: Secondary | ICD-10-CM | POA: Diagnosis not present

## 2023-08-13 DIAGNOSIS — Z87410 Personal history of cervical dysplasia: Secondary | ICD-10-CM | POA: Diagnosis not present

## 2023-08-13 DIAGNOSIS — N390 Urinary tract infection, site not specified: Secondary | ICD-10-CM | POA: Diagnosis present

## 2023-08-13 DIAGNOSIS — K5900 Constipation, unspecified: Secondary | ICD-10-CM | POA: Diagnosis present

## 2023-08-13 DIAGNOSIS — M810 Age-related osteoporosis without current pathological fracture: Secondary | ICD-10-CM | POA: Diagnosis present

## 2023-08-13 DIAGNOSIS — Z800 Family history of malignant neoplasm of digestive organs: Secondary | ICD-10-CM | POA: Diagnosis not present

## 2023-08-13 DIAGNOSIS — R001 Bradycardia, unspecified: Secondary | ICD-10-CM | POA: Diagnosis not present

## 2023-08-13 DIAGNOSIS — Z79899 Other long term (current) drug therapy: Secondary | ICD-10-CM

## 2023-08-13 DIAGNOSIS — H524 Presbyopia: Secondary | ICD-10-CM | POA: Diagnosis not present

## 2023-08-13 DIAGNOSIS — A499 Bacterial infection, unspecified: Secondary | ICD-10-CM | POA: Diagnosis not present

## 2023-08-13 DIAGNOSIS — Z9109 Other allergy status, other than to drugs and biological substances: Secondary | ICD-10-CM

## 2023-08-13 LAB — CBC
HCT: 33.7 % — ABNORMAL LOW (ref 36.0–46.0)
Hemoglobin: 11.8 g/dL — ABNORMAL LOW (ref 12.0–15.0)
MCH: 34.9 pg — ABNORMAL HIGH (ref 26.0–34.0)
MCHC: 35 g/dL (ref 30.0–36.0)
MCV: 99.7 fL (ref 80.0–100.0)
Platelets: 301 10*3/uL (ref 150–400)
RBC: 3.38 MIL/uL — ABNORMAL LOW (ref 3.87–5.11)
RDW: 16.4 % — ABNORMAL HIGH (ref 11.5–15.5)
WBC: 10.6 10*3/uL — ABNORMAL HIGH (ref 4.0–10.5)
nRBC: 0 % (ref 0.0–0.2)

## 2023-08-13 MED ORDER — MAGNESIUM OXIDE -MG SUPPLEMENT 400 (240 MG) MG PO TABS
400.0000 mg | ORAL_TABLET | Freq: Every day | ORAL | Status: DC
  Filled 2023-08-13: qty 1

## 2023-08-13 MED ORDER — ASPIRIN 81 MG PO TBEC
81.0000 mg | DELAYED_RELEASE_TABLET | Freq: Every day | ORAL | Status: DC
  Administered 2023-08-14 – 2023-08-19 (×6): 81 mg via ORAL
  Filled 2023-08-13 (×6): qty 1

## 2023-08-13 MED ORDER — MELATONIN 3 MG PO TABS: 3.0000 mg | ORAL_TABLET | Freq: Every evening | ORAL | Status: DC | PRN

## 2023-08-13 MED ORDER — ASPIRIN 81 MG PO TBEC: 81.0000 mg | DELAYED_RELEASE_TABLET | Freq: Every day | ORAL | Status: DC

## 2023-08-13 MED ORDER — ACETAMINOPHEN 325 MG PO TABS
650.0000 mg | ORAL_TABLET | Freq: Four times a day (QID) | ORAL | Status: DC | PRN
  Administered 2023-08-13 – 2023-08-18 (×7): 650 mg via ORAL
  Filled 2023-08-13 (×8): qty 2

## 2023-08-13 MED ORDER — MAGNESIUM OXIDE -MG SUPPLEMENT 400 (240 MG) MG PO TABS: 400.0000 mg | ORAL_TABLET | Freq: Every day | ORAL | Status: DC

## 2023-08-13 MED ORDER — CLONAZEPAM 0.5 MG PO TABS
0.5000 mg | ORAL_TABLET | Freq: Two times a day (BID) | ORAL | Status: DC | PRN
  Administered 2023-08-16: 0.5 mg via ORAL
  Filled 2023-08-13: qty 1

## 2023-08-13 MED ORDER — ENOXAPARIN SODIUM 40 MG/0.4ML IJ SOSY: 40.0000 mg | PREFILLED_SYRINGE | INTRAMUSCULAR | Status: DC

## 2023-08-13 MED ORDER — ACETAMINOPHEN 650 MG RE SUPP: 650.0000 mg | Freq: Four times a day (QID) | RECTAL | Status: DC | PRN

## 2023-08-13 MED ORDER — ROSUVASTATIN CALCIUM 20 MG PO TABS
20.0000 mg | ORAL_TABLET | Freq: Every day | ORAL | Status: DC
  Administered 2023-08-13 – 2023-08-18 (×6): 20 mg via ORAL
  Filled 2023-08-13 (×6): qty 1

## 2023-08-13 MED ORDER — ESCITALOPRAM OXALATE 10 MG PO TABS
10.0000 mg | ORAL_TABLET | Freq: Every day | ORAL | Status: DC
  Administered 2023-08-13 – 2023-08-18 (×6): 10 mg via ORAL
  Filled 2023-08-13 (×6): qty 1

## 2023-08-13 MED ORDER — ENOXAPARIN SODIUM 40 MG/0.4ML IJ SOSY
40.0000 mg | PREFILLED_SYRINGE | INTRAMUSCULAR | Status: DC
  Administered 2023-08-13 – 2023-08-18 (×6): 40 mg via SUBCUTANEOUS
  Filled 2023-08-13 (×6): qty 0.4

## 2023-08-13 MED ORDER — CLOPIDOGREL BISULFATE 75 MG PO TABS
75.0000 mg | ORAL_TABLET | Freq: Every day | ORAL | Status: DC
  Administered 2023-08-14 – 2023-08-19 (×6): 75 mg via ORAL
  Filled 2023-08-13 (×6): qty 1

## 2023-08-13 MED ORDER — CLOPIDOGREL BISULFATE 75 MG PO TABS: 75.0000 mg | ORAL_TABLET | Freq: Every day | ORAL | Status: DC

## 2023-08-13 MED ORDER — LOSARTAN POTASSIUM 25 MG PO TABS: 12.5000 mg | ORAL_TABLET | Freq: Every day | ORAL | Status: DC

## 2023-08-13 MED ORDER — BENZOCAINE 10 % MT GEL: Freq: Four times a day (QID) | OROMUCOSAL | Status: DC | PRN

## 2023-08-13 MED ORDER — LOSARTAN POTASSIUM 25 MG PO TABS
12.5000 mg | ORAL_TABLET | Freq: Every day | ORAL | Status: DC
  Filled 2023-08-13: qty 0.5

## 2023-08-13 NOTE — Discharge Instructions (Signed)
 Inpatient Rehab Discharge Instructions  Alicia Wilkerson [facility] Discharge date and time: No discharge date for patient encounter.   Activities/Precautions/ Functional Status: Activity: As tolerated Diet: Regular Wound Care: Routine skin checks Functional status:  ___ No restrictions     ___ Walk up steps independently ___ 24/7 supervision/assistance   ___ Walk up steps with assistance ___ Intermittent supervision/assistance  ___ Bathe/dress independently ___ Walk with walker     __x_ Bathe/dress with assistance ___ Walk Independently    ___ Shower independently ___ Walk with assistance    ___ Shower with assistance ___ No alcohol     ___ Return to work/school ________  Special Instructions: No driving smoking or alcohol   My questions have been answered and I understand these instructions. I will adhere to these goals and the provided educational materials after my discharge from the hospital.  Patient/Caregiver Signature _______________________________ Date __________  Clinician Signature _______________________________________ Date __________  Please bring this form and your medication list with you to all your follow-up doctor's appointments. STROKE/TIA DISCHARGE INSTRUCTIONS SMOKING Cigarette smoking nearly doubles your risk of having a stroke & is the single most alterable risk factor  If you smoke or have smoked in the last 12 months, you are advised to quit smoking for your health. Most of the excess cardiovascular risk related to smoking disappears within a year of stopping. Ask you doctor about anti-smoking medications [place] Quit Line: 1-800-QUIT NOW Free Smoking Cessation Classes [phone]  CHOLESTEROL Know your levels; limit fat & cholesterol in your diet  Lipid Panel     Component Value Date/Time   CHOL 128 08/11/2023 0613   CHOL 135 03/25/2019 0829   TRIG 59 08/11/2023 0613   HDL 52 08/11/2023 0613   HDL 75 03/25/2019 0829   CHOLHDL 2.5 08/11/2023 0613   VLDL 12  08/11/2023 0613   LDLCALC 64 08/11/2023 0613   LDLCALC 67 11/04/2019 0955     Many patients benefit from treatment even if their cholesterol is at goal. Goal: Total Cholesterol (CHOL) less than 160 Goal:  Triglycerides (TRIG) less than 150 Goal:  HDL greater than 40 Goal:  LDL (LDLCALC) less than 100   BLOOD PRESSURE American Stroke Association blood pressure target is less that 120/80 mm/Hg  Your discharge blood pressure is:  BP: (!) 115/51 Monitor your blood pressure Limit your salt and alcohol intake Many individuals will require more than one medication for high blood pressure  DIABETES (A1c is a blood sugar average for last 3 months) Goal HGBA1c is under 7% (HBGA1c is blood sugar average for last 3 months)  Diabetes: No known diagnosis of diabetes    Lab Results  Component Value Date   HGBA1C 3.7 (L) 08/10/2023    Your HGBA1c can be lowered with medications, healthy diet, and exercise. Check your blood sugar as directed by your physician Call your physician if you experience unexplained or low blood sugars.  PHYSICAL ACTIVITY/REHABILITATION Goal is 30 minutes at least 4 days per week  Activity: Increase activity slowly, Therapies: Physical Therapy: Home Health Return to work:  Activity decreases your risk of heart attack and stroke and makes your heart stronger.  It helps control your weight and blood pressure; helps you relax and can improve your mood. Participate in a regular exercise program. Talk with your doctor about the best form of exercise for you (dancing, walking, swimming, cycling).  DIET/WEIGHT Goal is to maintain a healthy weight  Your discharge diet is:  Diet Order  Diet Heart Room service appropriate? Yes; Fluid consistency: Thin  Diet effective now                   liquids Your height is:  Height: 5' 3 (160 cm) Your current weight is: Weight: 57.4 kg Your Body Mass Index (BMI) is:  BMI (Calculated): 22.42 Following the type of diet  specifically designed for you will help prevent another stroke. Your goal weight range is:   Your goal Body Mass Index (BMI) is 19-24. Healthy food habits can help reduce 3 risk factors for stroke:  High cholesterol, hypertension, and excess weight.  RESOURCES Stroke/Support Group:  Call [phone]   STROKE EDUCATION PROVIDED/REVIEWED AND GIVEN TO PATIENT Stroke warning signs and symptoms How to activate emergency medical system (call 911). Medications prescribed at discharge. Need for follow-up after discharge. Personal risk factors for stroke. Pneumonia vaccine given: No Flu vaccine given: No My questions have been answered, the writing is legible, and I understand these instructions.  I will adhere to these goals & educational materials that have been provided to me after my discharge from the hospital.

## 2023-08-13 NOTE — Progress Notes (Signed)
 PROGRESS NOTE    Alicia Wilkerson  ZOX:[id number] DOB: 05-18-1950 DOA: 08/09/2023 PCP: Colene Dauphin, MD  Subjective: Pt seen and examined.  Pt has ins authorization for CIR. Awaiting for CIR bed to become available. Met with pt and her sister Alicia Wilkerson.  Also talked with pt's dtr Alicia Wilkerson via phone. Dtr questions were answered. I did not recommend pt be transferred to [facility] medical center as pt needs intensive rehab at this point. Pt has an occluded right ICA but has likely been chronically occluded.  Pt c/o of loose BM last night. Will change mag oxide to just once a day in the AM.  Events of yesterday noted. No Code stroke called.  Pt c/o of right facial pain around her right cheek.   Hospital Course: HPI: Alicia Wilkerson is a 73 y.o. female with medical history significant for essential pretension, hyperlipidemia, who is admitted to [facility] on 08/09/2023 with acute encephalopathy after presenting from home to [facility] ED complaining of altered mental status.    In setting of the patient's altered mental status, history is provided by patient, as well as her daughter, in addition to my discussions with the EDP ED chart review.   Daughter conveys that the patient has been altered, confused over the last 1 to 2 days.  Daughter, who lives in the state of ER, first noted the patient to be confused on Saturday, 08/08/2023, with last known well occurring on Friday, 08/07/2023.  On Saturday, 08/08/2023, daughter also felt, via FaceTime evaluation, the patient was exhibiting some evidence of facial droop.   The patient reportedly experienced a ground-level fall on Saturday, 08/08/2023, in which she was leaning forward to pick something up, and tripped, resulting in her hitting her head on the floor, without any associated loss of consciousness.  She is on daily baby aspirin  as an outpatient in the absence of any additional blood thinners at home.  Additionally, the patient reportedly fell out of  her bed later on Saturday, 08/08/2023, which was also associated with no loss of consciousness.   No known history of diabetes .   Given ongoing confusion exhibited by the patient, daughter arrived from [place]  earlier today, and initially brought the patient to a local urgent care, before receiving recommendation to present to the ED for further evaluation management of the above.    Significant Events: Admitted 08/09/2023 for acute metabolic encephalopathy due to UTI   Admission Labs: WBC 16.7, HgB 12.6, plt 335 Na 134, K 3.4, CO2 of 23, BUN 14, Scr 0.94, glu 309 UA negative nitrite, Large LE, rare bacteria, WBC >50  Admission Imaging Studies: CT head/face No acute intracranial abnormality. 2.  No acute displaced facial fracture CXR No acute findings  CT head/angio Occlusion of the right ICA just distal to the bifurcation, age indeterminate, but presumably acute given patient's symptoms. Right ICA remains occluded to the terminus. Revascularization of the right MCA via collateral flow across the circle-of-Willis. 2. Fetal type origin of the right PCA with severe stenosis versus  occlusion at the origin of the right PCOM. Right PCA attenuated but patent distally. 3. Mild atheromatous change about the left carotid bulb and left carotid siphon without hemodynamically significant stenosis. 4. Small area of reticulonodular tree-in-bud densities within the left upper lobe, likely mild infection/small airways disease. 5.  Aortic Atherosclerosis  Significant Labs:   Significant Imaging Studies: MRI brain Patchy small volume acute ischemic nonhemorrhagic right PCA territory infarcts as above, with a few additional  punctate infarcts within the right frontal lobe. 2. Underlying mildly advanced cerebral atrophy CT head Generalized cerebral atrophy with widening of the extra-axial spaces and ventricular dilatation. 2. The patchy small volume acute right PCA territory infarct seen on the earlier MR head  is not clearly visualized on the current plain brain CT. 3. Mild left ethmoid sinus and mild sphenoid sinus mucosal thickening.  Antibiotic Therapy: Anti-infectives (From admission, onward)    Start     Dose/Rate Route Frequency Ordered Stop   08/10/23 1400  cefTRIAXone (ROCEPHIN) 1 g in sodium chloride  0.9 % 100 mL IVPB        1 g 200 mL/hr over 30 Minutes Intravenous Every 24 hours 08/09/23 2355     08/09/23 2215  cefTRIAXone (ROCEPHIN) 1 g in sodium chloride  0.9 % 100 mL IVPB        1 g 200 mL/hr over 30 Minutes Intravenous  Once 08/09/23 2205 08/09/23 2309       Procedures:   Consultants: neurology    Assessment and Plan: * Acute ischemic stroke (HCC) 08-11-2023 MRI brain positive for ischemic stroke. Echo LVEF 65%. No shunts. No thrombus.  Pt recommended CIR by PT/OT.  CIR has seen patient. Awaiting ins authorization. Discussed with pt and her sister that family may have to stay with her at her home after discharge from CIR or pt may need to go and stay with relatives after CIR.  On crestor  20 mg daily.  Lovenox  for DVT prophylaxis. On ASA and plavix.  08-12-2023 awaiting ins authorization.  08-13-2023 DC recs by neurology: 30 day heart monitor recommended at discharge aspirin  81 mg daily and clopidogrel 75 mg daily for 3 months and then Plavix alone.  If Atrial Fibrillation is diagnosed, would recommend starting OAC, likely eliquis instead.  Continue statin at discharge   Acute cystitis 08-11-2023 urine cx growing less than 10K CFU. She will complete 3 days of IV rocephin. Today is Day #2.   08-12-2023 completed 3 days of IV rocephin. UTI resolved.  PSVT (paroxysmal supraventricular tachycardia) (HCC) 08-11-2023 pt had episode of PSVT in my presence. HR 120s. Narrow complex tachycardia. I had pt performed Valsalva maneuver. PSVT terminated within about 3 secs.  Will give some po kcl and get her serum K above 4.5. also some mag oxide to get her serum Mg above 2.0. repeat  Mg and BMP in AM.  Cannot start betablockers due to resting HR in the high 50s and low 60s. SBP in the upper 90s.  08-12-2023 stable. Resting HR in high 50 - low 60s. Unable to start any AVN blockers.  08-13-2023 continue with Mg and Kcl supplementation to keep serum Mg >2.0 and K >4.0. will change mag oxide to 400 mg qday due to diarrhea she has had with BID dosing.. continue with kcl 20 meq daily.  Hyperglycemia 08-11-2023 A1C 3.7%. not consistent with diabetes.  Hyperglycemia likely due to stress from acute CVA and UTI.  08-12-2023 stable. resolved  Hypokalemia-resolved as of 08/11/2023 08-11-2023 treated on admission. Now resolved.  Severe sepsis (HCC)-resolved as of 08/12/2023 08-11-2023 present on admission. WBC 16K, HR 138. + UA for infection. Now resolved.  Essential (primary) hypertension 08-13-2023 continue with cozaar  25 daily at discharge.  ICAO (internal carotid artery occlusion), right 08-12-2023 seen on MRA head/neck and carotid U/S. No intervention required.  CAD (coronary artery disease) 08-11-2023 stable. On ASA, crestor .  08-12-2023 stable.   08-13-2023 continue ASA and crestor  at discharge.  HLD (hyperlipidemia) 08-11-2023 on crestor   20 mg at bedtime.  Lipid panel shows  Lab Results  Component Value Date/Time   CHOL 128 08/11/2023 06:13 AM   TRIG 59 08/11/2023 06:13 AM   HDL 52 08/11/2023 06:13 AM   CHOLHDL 2.5 08/11/2023 06:13 AM   VLDL 12 08/11/2023 06:13 AM   LDLCALC 64 08/11/2023 06:13 AM   08-12-2023 stable.  08-13-2023 continue crestor  at discharge.      DVT prophylaxis: enoxaparin  (LOVENOX ) injection 40 mg Start: 08/10/23 2200 SCDs Start: 08/09/23 2351    Code Status: Full Code Family Communication: discussed with sister Alicia Wilkerson at bedside. And dtr via phone. Disposition Plan: CIR Reason for continuing need for hospitalization: medically stable for transfer to CIR.  Objective: Vitals:   08/12/23 1800 08/12/23 1946 08/12/23 2350  08/13/23 0300  BP: 134/65 119/69 125/74 132/64  Pulse:  69 (!) 56 (!) 51  Resp:  18 19 17   Temp:  97.8 F (36.6 C) 97.7 F (36.5 C) 97.8 F (36.6 C)  TempSrc:  Oral Oral Oral  SpO2:  96% 95% 97%  Weight:    58.1 kg  Height:        Intake/Output Summary (Last 24 hours) at 08/13/2023 0942 Last data filed at 08/12/2023 1138 Gross per 24 hour  Intake 200 ml  Output --  Net 200 ml   Filed Weights   08/09/23 1745 08/11/23 0500 08/13/23 0300  Weight: 55.3 kg 56.1 kg 58.1 kg   Examination:  Physical Exam Vitals and nursing note reviewed.  Constitutional:      General: She is not in acute distress.    Appearance: She is normal weight. She is not toxic-appearing or diaphoretic.  HENT:     Head: Normocephalic.   Eyes:     General: No scleral icterus.  Pulmonary:     Effort: Pulmonary effort is normal.  Abdominal:     General: There is no distension.   Neurological:     Mental Status: She is alert and oriented to person, place, and time.     Data Reviewed: I have personally reviewed following labs and imaging studies  CBC: Recent Labs  Lab 08/09/23 1747 08/09/23 1801 08/10/23 0542 08/11/23 0613 08/12/23 0507 08/13/23 0450  WBC 16.7*  --  12.8* 11.6* 10.6* 10.6*  NEUTROABS 14.1*  --  9.3*  --   --   --   HGB 12.6 11.6* 12.0 12.0 12.4 11.8*  HCT 36.2 34.0* 33.5* 34.0* 35.6* 33.7*  MCV 100.0  --  97.1 97.7 98.9 99.7  PLT 335  --  300 346 322 301   Basic Metabolic Panel: Recent Labs  Lab 08/09/23 1747 08/09/23 1801 08/10/23 0542 08/11/23 0613 08/12/23 0507  NA 134* 135 137 135 137  K 3.4* 3.8 4.4 3.6 4.3  CL 98 97* 102 101 105  CO2 23  --  26 25 26   GLUCOSE 309* 300* 105* 106* 102*  BUN 14 16 10 9 9   CREATININE 0.94 0.80 0.63 0.68 0.64  CALCIUM  9.7  --  9.0 8.7* 9.0  MG  --   --  1.9  --  2.1   GFR: Estimated Creatinine Clearance: 53.4 mL/min (by C-G formula based on SCr of 0.64 mg/dL). Liver Function Tests: Recent Labs  Lab 08/09/23 1747  08/10/23 0542 08/10/23 1004  AST 24 18  --   ALT 17 16  --   ALKPHOS 58 45  --   BILITOT 1.6* 1.6* 1.5*  PROT 6.9 6.0*  --   ALBUMIN 3.5 3.0*  --  Coagulation Profile: Recent Labs  Lab 08/09/23 1747  INR 1.0   HbA1C: Recent Labs    08/10/23 1004  HGBA1C 3.7*   CBG: Recent Labs  Lab 08/09/23 1851 08/10/23 0937 08/10/23 1204  GLUCAP 205* 108* 96   Lipid Profile: Recent Labs    08/11/23 0613  CHOL 128  HDL 52  LDLCALC 64  TRIG 59  CHOLHDL 2.5   Thyroid  Function Tests: Recent Labs    08/10/23 1004  TSH 1.594   Anemia Panel: Recent Labs    08/10/23 1004  VITAMINB12 271   Sepsis Labs: Recent Labs  Lab 08/10/23 1026  LATICACIDVEN 0.9    Recent Results (from the past 240 hours)  Culture, OB Urine     Status: Abnormal   Collection Time: 08/09/23  9:00 PM   Specimen: Urine, Random  Result Value Ref Range Status   Specimen Description URINE, RANDOM  Final   Special Requests NONE  Final   Culture (A)  Final    <10,000 COLONIES/mL INSIGNIFICANT GROWTH NO GROUP B STREP (S.AGALACTIAE) ISOLATED Performed at [facility], [address] [place], [place] [address]    Report Status 08/11/2023 FINAL  Final  Culture, blood (Routine X 2) w Reflex to ID Panel     Status: None (Preliminary result)   Collection Time: 08/10/23  5:52 AM   Specimen: BLOOD  Result Value Ref Range Status   Specimen Description BLOOD LEFT ANTECUBITAL  Final   Special Requests   Final    BOTTLES DRAWN AEROBIC ONLY Blood Culture results may not be optimal due to an inadequate volume of blood received in culture bottles   Culture   Final    NO GROWTH 3 DAYS Performed at [facility], [address] [place], [place] [address]    Report Status PENDING  Incomplete  Culture, blood (Routine X 2) w Reflex to ID Panel     Status: None (Preliminary result)   Collection Time: 08/10/23  5:57 AM   Specimen: BLOOD  Result Value Ref Range Status   Specimen Description BLOOD  LEFT ANTECUBITAL  Final   Special Requests   Final    BOTTLES DRAWN AEROBIC ONLY Blood Culture results may not be optimal due to an inadequate volume of blood received in culture bottles   Culture   Final    NO GROWTH 3 DAYS Performed at [facility], [address] [place], [place] [address]    Report Status PENDING  Incomplete     Radiology Studies: VAS US  CAROTID Result Date: 08/11/2023 Carotid Arterial Duplex Study Patient Name:  Alicia Wilkerson  Date of Exam:   08/11/2023 Medical Rec #: [id number]        Accession #:    [id number] Date of Birth: Mar 10, 1950        Patient Gender: F Patient Age:   47 years Exam Location:  [facility] Procedure:      VAS US  CAROTID Referring Phys: Roxan Copes --------------------------------------------------------------------------------  Risk Factors: Hypertension, hyperlipidemia, coronary artery disease. Performing Technologist: Arlyce Berger RVT, RDMS  Examination Guidelines: A complete evaluation includes B-mode imaging, spectral Doppler, color Doppler, and power Doppler as needed of all accessible portions of each vessel. Bilateral testing is considered an integral part of a complete examination. Limited examinations for reoccurring indications may be performed as noted.  Right Carotid Findings: +----------+--------+--------+--------+------------------+--------+           PSV cm/sEDV cm/sStenosisPlaque DescriptionComments +----------+--------+--------+--------+------------------+--------+ CCA Prox  71  0                                          +----------+--------+--------+--------+------------------+--------+ CCA Distal51      0                                          +----------+--------+--------+--------+------------------+--------+ ICA Prox                  Occluded                           +----------+--------+--------+--------+------------------+--------+ ICA Distal                Occluded                            +----------+--------+--------+--------+------------------+--------+ ECA       144     3                                          +----------+--------+--------+--------+------------------+--------+ +----------+--------+-------+----------------+-------------------+           PSV cm/sEDV cmsDescribe        Arm Pressure (mmHG) +----------+--------+-------+----------------+-------------------+ [id number]            Multiphasic, WNL                    +----------+--------+-------+----------------+-------------------+ +---------+--------+--+--------+--+---------+ VertebralPSV cm/s58EDV cm/s15Antegrade +---------+--------+--+--------+--+---------+  Left Carotid Findings: +----------+--------+--------+--------+------------------+------------------+           PSV cm/sEDV cm/sStenosisPlaque DescriptionComments           +----------+--------+--------+--------+------------------+------------------+ CCA Prox  114     23                                intimal thickening +----------+--------+--------+--------+------------------+------------------+ CCA Distal78      16                                intimal thickening +----------+--------+--------+--------+------------------+------------------+ ICA Prox  85      19              focal and calcific                   +----------+--------+--------+--------+------------------+------------------+ ICA Distal77      23                                                   +----------+--------+--------+--------+------------------+------------------+ ECA       92      0                                                    +----------+--------+--------+--------+------------------+------------------+ +----------+--------+--------+----------------+-------------------+  PSV cm/sEDV cm/sDescribe        Arm Pressure (mmHG) +----------+--------+--------+----------------+-------------------+ [id number]              Multiphasic, WNL                    +----------+--------+--------+----------------+-------------------+ +---------+--------+--+--------+-+---------+ VertebralPSV cm/s62EDV cm/s6Antegrade +---------+--------+--+--------+-+---------+   Summary: Right Carotid: Evidence consistent with a total occlusion of the right ICA. Left Carotid: The extracranial vessels were near-normal with only minimal wall               thickening or plaque. Vertebrals:  Bilateral vertebral arteries demonstrate antegrade flow. Subclavians: Normal flow hemodynamics were seen in bilateral subclavian              arteries. *See table(s) above for measurements and observations.  Electronically signed by Runell Countryman on 08/11/2023 at 4:32:35 PM.    Final     Scheduled Meds:  aspirin  EC  81 mg Oral Daily   clopidogrel  75 mg Oral Daily   enoxaparin  (LOVENOX ) injection  40 mg Subcutaneous Q24H   escitalopram   10 mg Oral QHS   magnesium oxide  400 mg Oral Daily   potassium chloride  20 mEq Oral Daily   rosuvastatin   20 mg Oral QHS   sodium chloride  flush  3 mL Intravenous Once   Continuous Infusions:   LOS: 3 days   Time spent: 50 minutes  Unk Garb, DO  [facility]  08/13/2023, 9:42 AM

## 2023-08-13 NOTE — Progress Notes (Addendum)
 Bea Lime, DO  Physician Physical Medicine and Rehabilitation   PMR Pre-admission    Signed   Date of Service: 08/11/2023  2:28 PM  Related encounter: ED to Hosp-Admission (Discharged) from 08/09/2023 in [facility]   Signed     Expand All Collapse All  PMR Admission Coordinator Pre-Admission Assessment   Patient: Alicia Wilkerson is an 73 y.o., female MRN: [id number] DOB: [birth year] Height: 5' 3 (160 cm) Weight: 56.1 kg                                                                                                                                                  Insurance Information HMO:     PPO: yes     PCP:      IPA:      80/20:      OTHER:  PRIMARY: [employer] Medicare      Policy#: [id number]       Subscriber: pt CM Name: Sigmund Drew      Phone#: [phone]     Fax#: [phone] Pre-Cert#: [id number] auth for CIR from Sigmund Drew with [employer] Medicare with updates due to fax listed above on 6/25      Employer:  Benefits:  Phone #: [phone]     Name: n/a Eff. Date: 02/25/23     Deduct: $0      Out of Pocket Max: $4150 ($230 met)      Life Max:   CIR: $300/day for days 1-6      SNF: $10/day for days 1-20 Outpatient:      Co-Pay: $10/visit Home Health: 100%      Co-Pay:  DME: 80%     Co-Pay: 20% Providers:  SECONDARY:       Policy#:       Phone#:    Artist:       Phone#:    The Engineer, materials Information Summary" for patients in Inpatient Rehabilitation Facilities with attached "Privacy Act Statement-Health Care Records" was provided and verbally reviewed with: Patient and Family   Emergency Contact Information Contact Information       Name Relation Home Work Mobile    [place] Daughter [phone]   [phone]    ALLENE, FURUYA [phone]   [phone]    Gibson Kurtz [phone]   [phone]         Other Contacts   None on File      Current Medical History  Patient Admitting Diagnosis: CVA    History of Present Illness: Pt is a 73 y/o female with PMH of HTN who presented to [facility] on 08/09/23 with 2 day history of AMS.  Pt's daughter reports pt experienced a ground level fall on 6/14 in which she leaned forward to pick something up, fell and hit her head.  No LOC.  Endorsed a second  fall later that day out of the bed.  In ED pt was afebrile, vitals WNL, labs notable for sodium 134, potassium 3.4, glucose 309, bilirubin 1.6, WBC 16.7.  Imaging in the ED notable for occlusion of right ICA just distal to the bifurcation with revascularization of the R MCA via collateral flow across COW.  CTA head/neck also showed R PCA with severe stenosis/occlusion with distal patency.  MRI revealed multiple embolic appearing infarcts in the right MCA and PCA territories.  ED workup also revealed UTI and tachycardia with intermittent rhythm regularity.  Neurology recommended DAPT x3 months, then plavix alone and loop recorder.  Therapy evaluations completed and pt was recommended for CIR.    Complete NIHSS TOTAL: 0 Glasgow Coma Scale Score: 15   Patient's medical record from [facility] has been reviewed by the rehabilitation admission coordinator and physician.   Past Medical History      Past Medical History:  Diagnosis Date   Anemia     Anxiety disorder     Basal cell carcinoma (BCC) of left upper arm 12/2015   Basal cell carcinoma (BCC) of right lower leg 04/2016   Borderline hypertension     CAD (coronary artery disease)      a. nonobst by cor CT 12/2018.   CIN II (cervical intraepithelial neoplasia II)      CIN  1   Depression     Eczema     Family history of premature CAD     History of basal cell carcinoma (BCC) excision      left upper arm 2017;   right lower leg 2018;  bilateral lower leg and hip area 2019;   inner right lower leg, outer right thigh, & upper outside left arm 09/ 2020   History of cervical dysplasia 2013   History of hereditary spherocytosis      s/p splenectomy in  1954   History of hyperparathyroidism      per pt yrs ago was put on mega dose of vit d which caused the hyperparathyroid resolved after stopping vit d   History of pelvic fracture 2013   MAI (mycobacterium avium-intracellulare) (HCC)      ? possibly by CT 12/2018   Mild hyperlipidemia     Osteoporosis     PONV (postoperative nausea and vomiting)     S/P hernia repair 03/02/2023   S/P splenectomy      spherocytosis   Wears glasses     White coat syndrome without diagnosis of hypertension            Has the patient had major surgery during 100 days prior to admission? No   Family History  family history includes Colon cancer in her mother; Crohn's disease in her daughter; Heart disease in her father and sister; Heart failure in her father; Hypertension in her father; Osteoporosis in her mother; Skin cancer in her father.     Current Medications   Current Medications    Current Facility-Administered Medications:    acetaminophen  (TYLENOL ) tablet 650 mg, 650 mg, Oral, Q6H PRN **OR** acetaminophen  (TYLENOL ) suppository 650 mg, 650 mg, Rectal, Q6H PRN, Howerter, Justin B, DO   aspirin  EC tablet 81 mg, 81 mg, Oral, Daily, Khaliqdina, Salman, MD, 81 mg at 08/11/23 0905   cefTRIAXone (ROCEPHIN) 1 g in sodium chloride  0.9 % 100 mL IVPB, 1 g, Intravenous, Q24H, Howerter, Justin B, DO, Last Rate: 200 mL/hr at 08/11/23 1414, 1 g at 08/11/23 1414   clopidogrel (PLAVIX) tablet 75  mg, 75 mg, Oral, Daily, Khaliqdina, Salman, MD, 75 mg at 08/11/23 8119   enoxaparin  (LOVENOX ) injection 40 mg, 40 mg, Subcutaneous, Q24H, Wouk, Haynes Lips, MD, 40 mg at 08/10/23 2201   escitalopram  (LEXAPRO ) tablet 10 mg, 10 mg, Oral, QHS, Wouk, Haynes Lips, MD   melatonin tablet 3 mg, 3 mg, Oral, QHS PRN, Howerter, Justin B, DO   ondansetron  (ZOFRAN ) injection 4 mg, 4 mg, Intravenous, Q6H PRN, Howerter, Justin B, DO   rosuvastatin  (CRESTOR ) tablet 20 mg, 20 mg, Oral, QHS, Howerter, Justin B, DO   sodium chloride   flush (NS) 0.9 % injection 3 mL, 3 mL, Intravenous, Once, Albertus Hughs, DO     Patients Current Diet:  Diet Order                  Diet heart healthy/carb modified Room service appropriate? Yes; Fluid consistency: Thin  Diet effective now                         Precautions / Restrictions Precautions Precautions: Fall Precaution/Restrictions Comments: watch HR elevation, max 182bpm Restrictions Weight Bearing Restrictions Per Provider Order: No    Has the patient had 2 or more falls or a fall with injury in the past year?Yes   Prior Activity Level Community (5-7x/wk): independent with no DME prior to admit, driving, retired   Prior Functional Level Prior Function Prior Level of Function : Independent/Modified Independent, Driving   Self Care: Did the patient need help bathing, dressing, using the toilet or eating?  Independent   Indoor Mobility: Did the patient need assistance with walking from room to room (with or without device)? Independent   Stairs: Did the patient need assistance with internal or external stairs (with or without device)? Independent   Functional Cognition: Did the patient need help planning regular tasks such as shopping or remembering to take medications? Independent   Patient Information Are you of Hispanic, Latino/a,or Spanish origin?: A. No, not of Hispanic, Latino/a, or Spanish origin What is your race?: A. White Do you need or want an interpreter to communicate with a doctor or health care staff?: 0. No   Patient's Response To:  Health Literacy and Transportation Is the patient able to respond to health literacy and transportation needs?: Yes Health Literacy - How often do you need to have someone help you when you read instructions, pamphlets, or other written material from your doctor or pharmacy?: Never In the past 12 months, has lack of transportation kept you from medical appointments or from getting medications?: No In the past 12  months, has lack of transportation kept you from meetings, work, or from getting things needed for daily living?: No   Home Assistive Devices / Equipment Home Equipment: Agricultural consultant (2 wheels), [employer] - single point, BSC/3in1, Wheelchair - manual, Shower seat (maybe rshower seat, WC, BSC? daughter will check)   Prior Device Use: Indicate devices/aids used by the patient prior to current illness, exacerbation or injury? None of the above   Current Functional Level Cognition      Extremity Assessment (includes Sensation/Coordination)   Upper Extremity Assessment: LUE deficits/detail LUE Deficits / Details: minimally weaker as compared to R. additionally has notably decr coordination with finger to nose, dysmetria, and dysdiadokinesia testing LUE Coordination: decreased fine motor, decreased gross motor  Lower Extremity Assessment: Defer to PT evaluation RLE Deficits / Details: grossly 4/5 or better. Rt adductor drift noted with SLR RLE Coordination: decreased gross motor, decreased  fine motor LLE Coordination: decreased gross motor, decreased fine motor     ADLs   Overall ADL's : Needs assistance/impaired Eating/Feeding: Set up, Sitting Grooming: Contact guard assist, Sitting Upper Body Bathing: Contact guard assist, Sitting Lower Body Bathing: Minimal assistance, Sit to/from stand Upper Body Dressing : Contact guard assist, Sitting Lower Body Dressing: Minimal assistance, Sit to/from stand Toilet Transfer: Minimal assistance, Moderate assistance, +2 for physical assistance, +2 for safety/equipment Functional mobility during ADLs: Minimal assistance, Moderate assistance, Maximal assistance, +2 for safety/equipment, +2 for physical assistance General ADL Comments: LOB with turning in hall requiring max A to correct     Mobility   Overal bed mobility: Needs Assistance Bed Mobility: Supine to Sit, Sit to Supine Supine to sit: Contact guard, HOB elevated Sit to supine: Contact guard  assist General bed mobility comments: cues for safety and lines, no assist     Transfers   Overall transfer level: Needs assistance Equipment used: None Transfers: Sit to/from Stand, Bed to chair/wheelchair/BSC Sit to Stand: Contact guard assist Bed to/from chair/wheelchair/BSC transfer type:: Step pivot Step pivot transfers: Contact guard assist, Min assist General transfer comment: minA to steady with turning to toilet, but pt using UE well to safely lower and rise from toilet     Ambulation / Gait / Stairs / Wheelchair Mobility   Ambulation/Gait Ambulation/Gait assistance: Editor, commissioning (Feet): 15 Feet Assistive device: None Gait Pattern/deviations: Step-through pattern, Decreased stride length, Drifts right/left General Gait Details: limited to short distance ambulation due to HR elevation first to 140bpm then to 182bpm with pt standing in room after using bathroom. the pt was then returned to bed so RN could get EKG. further mobility held, not able to further assess/challenge balance. Gait velocity: decr Gait velocity interpretation: <1.31 ft/sec, indicative of household ambulator     Posture / Balance Balance Overall balance assessment: Needs assistance Sitting-balance support: Feet supported Sitting balance-Leahy Scale: Good Standing balance support: During functional activity, Single extremity supported, No upper extremity supported Standing balance-Leahy Scale: Poor Standing balance comment: not able to fully assess, increased sway with direction changes in bathroom and with turning to flush. Standardized Balance Assessment Standardized Balance Assessment : Dynamic Gait Index Dynamic Gait Index Level Surface: Normal Gait with Horizontal Head Turns: Mild Impairment Gait and Pivot Turn: Severe Impairment     Special needs/care consideration N/a        Previous Home Environment (from acute therapy documentation) Living Arrangements: Alone Available Help at  Discharge: Family Type of Home: House Home Layout: Two level, Bed/bath upstairs Alternate Level Stairs-Rails: Left Alternate Level Stairs-Number of Steps: flight Home Access: Stairs to enter Entrance Stairs-Rails: Right, Left (wide) Entrance Stairs-Number of Steps: 4 Bathroom Shower/Tub: Associate Professor: Yes Home Care Services: No   Discharge Living Setting Plans for Discharge Living Setting: Patient's home (family to stay with her) Type of Home at Discharge: House Discharge Home Layout: Two level, Bed/bath upstairs Alternate Level Stairs-Rails: Left Alternate Level Stairs-Number of Steps: full flight Discharge Home Access: Stairs to enter Entrance Stairs-Rails: Right, Left Entrance Stairs-Number of Steps: 4 Discharge Bathroom Shower/Tub: Tub/shower unit Discharge Bathroom Toilet: Standard Discharge Bathroom Accessibility: Yes How Accessible: Accessible via walker Does the patient have any problems obtaining your medications?: No   Social/Family/Support Systems Anticipated Caregiver: sister, [name], confirmed caregiver support Anticipated Caregiver's Contact Information: [phone] Ability/Limitations of Caregiver: none stated Caregiver Availability: 24/7 Discharge Plan Discussed with Primary Caregiver: Yes Is Caregiver In Agreement with Plan?: Yes Does  Caregiver/Family have Issues with Lodging/Transportation while Pt is in Rehab?: No     Goals Patient/Family Goal for Rehab: PT/OT/SLP mod I Expected length of stay: 7-10 days Additional Information: Discharge plan: return to patient's home, family able to provide 24/7 supervision if needed Pt/Family Agrees to Admission and willing to participate: Yes Program Orientation Provided & Reviewed with Pt/Caregiver Including Roles  & Responsibilities: Yes     Decrease burden of Care through IP rehab admission: N/A     Possible need for SNF placement upon discharge: Not  anticipated. Plan for discharge back to pt's home where family (sisters, friends, etc) can provide supervision if needed.      Patient Condition: This patient's condition remains as documented in the consult dated 08/11/23, in which the Rehabilitation Physician determined and documented that the patient's condition is appropriate for intensive rehabilitative care in an inpatient rehabilitation facility. Will admit to inpatient rehab today.   Preadmission Screen Completed By:  [name], PT, DPT 08/11/2023 2:28 PM ______________________________________________________________________   Discussed status with Dr. Dorn Gaskins on 08/13/23 at  10:25 AM  and received approval for admission today.   Admission Coordinator:  Carnesha Maravilla E Daveon Arpino, PT, DPT time 10:25 AM Alanna Hu 08/13/23              Revision History

## 2023-08-13 NOTE — H&P (Signed)
 Physical Medicine and Rehabilitation Admission H&P        Chief Complaint  Patient presents with   Extremity Weakness   Altered Mental Status  : HPI: Alicia Wilkerson is a 73 year old right handed female with history significant for essential hypertension, hyperlipidemia, anxiety/depression disorder, CAD maintained on low-dose aspirin , history of basal cell carcinoma left upper arm 2017, right lower leg 2018, bilateral lower leg and hip area 2019, inner right lower leg, outer right thigh and upper outside left arm 10/2018, osteoporosis, cervical intraepithelial neoplasia,.  Per chart review patient lives alone.  Two-level home bed and bath upstairs.  Reportedly independent driving prior to admission.  Presented 08/09/2023 with altered mental status after being confused for 1 to 2 days.  Patient had a reported fall x 2 and did strike her head on the floor.  Cranial CT scan negative for acute intracranial abnormality.  No acute displaced facial fracture.  CT maxillofacial negative.  CTA of head and neck showed occlusion of the right ICA just distal to the bifurcation, age-indeterminate, but presumably acute given patient's symptoms.  Right ICA remained occluded to the terminus.  Revascularization of the right MCA via collateral flow across the circle of Willis.  No intervention was required for internal carotid artery occlusion.  MRI showed patchy small volume acute ischemic nonhemorrhagic right PCA territory infarct with a few additional punctate infarcts within the right frontal lobe.  Underlying mildly advanced cerebral atrophy.  Admission chemistries unremarkable except WBC 16,700, sodium 134, potassium 3.4, glucose 309, total bilirubin 1.6, hemoglobin A1c 3.7, urinalysis negative and culture OB urine less than 10,000 insignificant growth nitrite.  Echocardiogram with ejection fraction of 60 to 65% no wall motion abnormalities grade 1 diastolic dysfunction.  Carotid Dopplers showed no significant  bilateral extracranial stenosis.  Neurology follow-up placed on aspirin  81 mg daily and Plavix 75 mg daily x 3 months then Plavix alone.  Recommendations of 30-day heart monitor at discharge.  Patient did complete a 3-day empiric course of IV Rocephin for acute cystitis/UTI.  Hospital course 08/11/2023 episode of PSVT heart rate in 120s narrow complex tachycardia.  Valsalva maneuver performed patient terminated within about 3 seconds.  Beta-blocker was not started due to resting heart rate in the 50s and low 60s and monitored.  Lovenox  was initiated for DVT prophylaxis.  She is maintained on a regular consistency diet.  Therapy evaluations completed due to patient decreased functional mobility was admitted for a comprehensive rehab program.   Review of Systems  Constitutional:  Negative for chills and fever.  HENT:  Negative for hearing loss.   Eyes:  Negative for blurred vision and double vision.  Respiratory:  Negative for cough, shortness of breath and wheezing.   Cardiovascular:  Positive for palpitations. Negative for leg swelling.  Gastrointestinal:  Positive for constipation. Negative for heartburn, nausea and vomiting.  Genitourinary:  Negative for dysuria, flank pain and hematuria.  Musculoskeletal:  Positive for falls and myalgias.  Neurological:  Positive for weakness.  Psychiatric/Behavioral:  Positive for depression.        Nervousness/anxiety  All other systems reviewed and are negative.       Past Medical History:  Diagnosis Date   Anemia     Anxiety disorder     Basal cell carcinoma (BCC) of left upper arm 12/2015   Basal cell carcinoma (BCC) of right lower leg 04/2016   Borderline hypertension     CAD (coronary artery disease)  a. nonobst by cor CT 12/2018.   CIN II (cervical intraepithelial neoplasia II)      CIN  1   Depression     Eczema     Family history of malignant neoplasm of gastrointestinal tract 08/14/2020   Family history of premature CAD     History of  basal cell carcinoma (BCC) excision      left upper arm 2017;   right lower leg 2018;  bilateral lower leg and hip area 2019;   inner right lower leg, outer right thigh, & upper outside left arm 09/ 2020   History of cervical dysplasia 2013   History of hereditary spherocytosis      s/p splenectomy in 1954   History of hyperparathyroidism      per pt yrs ago was put on mega dose of vit d which caused the hyperparathyroid resolved after stopping vit d   History of pelvic fracture 2013   Left inguinal hernia 01/02/2023   MAI (mycobacterium avium-intracellulare) (HCC)      ? possibly by CT 12/2018   Mild hyperlipidemia     Osteoporosis     PONV (postoperative nausea and vomiting)     S/P hernia repair 03/02/2023   S/P splenectomy      spherocytosis   Wears glasses     White coat syndrome without diagnosis of hypertension               Past Surgical History:  Procedure Laterality Date   ANKLE SURGERY   03/14/2015   BREAST BIOPSY Left 08/30/2021    CYSTIC APOCRINE METAPLASIA   CERVICAL CONIZATION W/BX N/A 12/13/2018    Procedure: CONIZATION CERVIX WITH BIOPSY;  Surgeon: Lillian Rein, MD;  Location: [facility] OR;  Service: Gynecology;  Laterality: N/A;   CYSTOSCOPY N/A 10/18/2019    Procedure: CYSTOSCOPY;  Surgeon: Lillian Rein, MD;  Location: [facility];  Service: Gynecology;  Laterality: N/A;   IR RADIOLOGY PERIPHERAL GUIDED IV START   01/05/2019   IR US  GUIDE VASC ACCESS RIGHT   01/05/2019   KNEE SURGERY Left 1991    per pt retained hardward   LEEP N/A 12/13/2018    Procedure: possible LOOP ELECTROSURGICAL EXCISION PROCEDURE (LEEP);  Surgeon: Lillian Rein, MD;  Location: [facility] OR;  Service: Gynecology;  Laterality: N/A;   ORIF ANKLE FRACTURE Left 03/14/2015    per pt retained hardware   ORIF WRIST FRACTURE Left 03/10/2022    Procedure: OPEN REDUCTION INTERNAL FIXATION (ORIF) WRIST FRACTURE;  Surgeon: Arvil Birks, MD;  Location[facility] OR;  Service: Orthopedics;   Laterality: Left;  regional with iv sedation   SPLENECTOMY, TOTAL   1954    due to spherocytosis   TOTAL LAPAROSCOPIC HYSTERECTOMY WITH SALPINGECTOMY N/A 10/18/2019    Procedure: TOTAL LAPAROSCOPIC HYSTERECTOMY WITH BILATERAL SALPINGECTOMY AND BILATERAL OOPHORECTOMY;  Surgeon: Lillian Rein, MD;  Location: [facility];  Service: Gynecology;  Laterality: N/A;  BILATERAL SALPINGECTOMY POSS OOPHORECTOMY   TUBAL LIGATION Bilateral 1995             Family History  Problem Relation Age of Onset   Osteoporosis Mother     Colon cancer Mother          deceased   Heart failure Father     Skin cancer Father     Hypertension Father     Heart disease Father     Heart disease Sister     Crohn's disease Daughter  Breast cancer Neg Hx          Social History:  reports that she has never smoked. She has never used smokeless tobacco. She reports that she does not currently use alcohol. She reports that she does not use drugs. Allergies:  Allergies       Allergies  Allergen Reactions   Penicillins Rash      Did it involve swelling of the face/tongue/throat, SOB, or low BP? No Did it involve sudden or severe rash/hives, skin peeling, or any reaction on the inside of your mouth or nose?  #  #  #  YES  #  #  #  Did you need to seek medical attention at a hospital or doctor's office? #  #  #  YES  #  #  #  When did it last happen?   years ago    If all above answers are NO, may proceed with cephalosporin use.     Sulfa Antibiotics Hives and Other (See Comments)   Nickel Hives   Penicillin G Other (See Comments)            Medications Prior to Admission  Medication Sig Dispense Refill   aspirin  EC 81 MG tablet Take 81 mg by mouth at bedtime.       clonazePAM  (KLONOPIN ) 0.5 MG tablet 1 tab by mouth once daily as needed 30 tablet 0   escitalopram  (LEXAPRO ) 10 MG tablet TAKE ONE TABLET BY MOUTH ONCE A DAY (Patient taking differently: Take 10 mg by mouth at bedtime.) 90  tablet 0   losartan  (COZAAR ) 25 MG tablet Take 1 tablet (25 mg total) by mouth daily. (Patient taking differently: Take 25 mg by mouth at bedtime.) 90 tablet 1   rosuvastatin  (CRESTOR ) 20 MG tablet Take 1 tablet (20 mg total) by mouth daily. (Patient taking differently: Take 20 mg by mouth at bedtime.) 90 tablet 0   [DISCONTINUED] Omega-3 Fatty Acids (FISH OIL) 600 MG CAPS Take 1,200 capsules by mouth at bedtime.                  Home: Home Living Family/patient expects to be discharged to:: Private residence Living Arrangements: Alone Available Help at Discharge: Family Type of Home: House Home Access: Stairs to enter Secretary/administrator of Steps: 4 Entrance Stairs-Rails: Right, Left (wide) Home Layout: Two level, Bed/bath upstairs Alternate Level Stairs-Number of Steps: flight Alternate Level Stairs-Rails: Left Bathroom Shower/Tub: Engineer, manufacturing systems: Standard Bathroom Accessibility: Yes Home Equipment: Agricultural consultant (2 wheels), [employer] - single point, BSC/3in1, Wheelchair - manual, Shower seat (maybe rshower seat, WC, BSC? daughter will check)  Lives With: Alone   Functional History: Prior Function Prior Level of Function : Independent/Modified Independent, Driving   Functional Status:  Mobility: Bed Mobility Overal bed mobility: Needs Assistance Bed Mobility: Supine to Sit, Sit to Supine Supine to sit: Contact guard, HOB elevated Sit to supine: Contact guard assist General bed mobility comments: Pt standing in room upon arrival. Pt sitting in chair end of session Transfers Overall transfer level: Needs assistance Equipment used: None Transfers: Sit to/from Stand Sit to Stand: Contact guard assist Bed to/from chair/wheelchair/BSC transfer type:: Step pivot Step pivot transfers: Contact guard assist, Min assist General transfer comment: CGA for transferring stand to sit to chair. Pt already standing in room upon PT  arrival Ambulation/Gait Ambulation/Gait assistance: Min assist, Contact guard assist Gait Distance (Feet): 190 Feet Assistive device: None Gait Pattern/deviations: Step-through pattern, Decreased stride length, Drifts right/left,  Narrow base of support General Gait Details: Pt ambulates with intermittent narrow feet placement followed by a lateral sway/LOB, needing up to minA to recover. Cues provided to widen stance to try to improve posture. Pt slows gait and intermittently sways when turning head L <> R or nodding head up <> down. Able to change speeds without LOB. Sway noted with high knee stepping to simulate stepping over obstacles Gait velocity: decr Gait velocity interpretation: <1.8 ft/sec, indicate of risk for recurrent falls   ADL: ADL Overall ADL's : Needs assistance/impaired Eating/Feeding: Set up, Sitting Grooming: Contact guard assist, Sitting Upper Body Bathing: Contact guard assist, Sitting Lower Body Bathing: Minimal assistance, Sit to/from stand Upper Body Dressing : Contact guard assist, Sitting Lower Body Dressing: Minimal assistance, Sit to/from stand Toilet Transfer: Minimal assistance, Moderate assistance, +2 for physical assistance, +2 for safety/equipment Functional mobility during ADLs: Minimal assistance, Moderate assistance, Maximal assistance, +2 for safety/equipment, +2 for physical assistance General ADL Comments: LOB with turning in hall requiring max A to correct   Cognition: Cognition Overall Cognitive Status: Impaired/Different from baseline Arousal/Alertness: Awake/alert Orientation Level: Oriented X4 Year: 2025 Month: June Day of Week: Incorrect Attention: Sustained Sustained Attention: Appears intact Memory: Impaired Memory Impairment: Retrieval deficit, Storage deficit Problem Solving: Impaired Problem Solving Impairment: Verbal complex Executive Function: Sequencing, Organizing Sequencing: Impaired Cognition Arousal: Alert Behavior  During Therapy: WFL for tasks assessed/performed Overall Cognitive Status: Impaired/Different from baseline   Physical Exam: Blood pressure 132/64, pulse (!) 51, temperature 97.8 F (36.6 C), temperature source Oral, resp. rate 17, height 5' 3 (1.6 m), weight 58.1 kg, last menstrual period 02/24/2002, SpO2 97%. Physical Exam   Constitutional: No apparent distress. Appropriate appearance for age.  HENT: No JVD. Neck Supple. Trachea midline. Atraumatic, normocephalic. Eyes: PERRLA. EOMI. Visual fields grossly intact.  Cardiovascular: RRR, no murmurs/rub/gallops. No Edema. Peripheral pulses 2+  Respiratory: CTAB. No rales, rhonchi, or wheezing. On RA.  Abdomen: + bowel sounds, normoactive. No distention or tenderness.  Skin: C/D/I. No apparent lesions. MSK:      No apparent deformity.       Neurologic exam:  Cognition: AAO to person, place, time and event. + High-level cognitive deficits; cannot spell world backwards, cannot add change Language: Fluent, No substitutions or neoglisms. No dysarthria. Names 3/3 objects correctly.  Memory: Recalls 2/3 objects at 5 minutes.  Occasional mild deficits. Insight: Fair insight into current condition.  Mood: Pleasant affect, appropriate mood.  Sensation: To light touch intact in BL UEs and LEs  Reflexes: 2+ in BL UE and LEs. Negative Hoffman's and babinski signs bilaterally.  CN: 2-12 grossly intact.  Coordination: No apparent tremors. No ataxia on FTN, HTS bilaterally.  Spasticity: MAS 0 in all extremities.       Strength:                RUE: 5/5 SA, 5/5 EF, 5/5 EE, 5/5 WE, 5/5 FF, 5/5 FA                LUE:  4/5 SA, 4/5 EF, 4/5 EE, 4/5 WE, 4/5 FF, 4/5 FA                RLE: 5/5 HF, 5/5 KE, 5/5  DF, 5/5  EHL, 5/5  PF                 LLE:  5-/5 HF, 5-/5 KE, 5-/5  DF, 5-/5  EHL, 5-/5  PF     Lab Results Last 48 Hours  Results for orders placed or performed during the hospital encounter of 08/09/23 (from the past 48 hours)  Lipid panel      Status: None    Collection Time: 08/11/23  6:13 AM  Result Value Ref Range    Cholesterol 128 0 - 200 mg/dL    Triglycerides 59 <161 mg/dL    HDL 52 >09 mg/dL    Total CHOL/HDL Ratio 2.5 RATIO    VLDL 12 0 - 40 mg/dL    LDL Cholesterol 64 0 - 99 mg/dL      Comment:        Total Cholesterol/HDL:CHD Risk Coronary Heart Disease Risk Table                     Men   Women  1/2 Average Risk   3.4   3.3  Average Risk       5.0   4.4  2 X Average Risk   9.6   7.1  3 X Average Risk  23.4   11.0        Use the calculated Patient Ratio above and the CHD Risk Table to determine the patient's CHD Risk.        ATP III CLASSIFICATION (LDL):  <100     mg/dL   Optimal  604-540  mg/dL   Near or Above                    Optimal  130-159  mg/dL   Borderline  981-191  mg/dL   High  >478     mg/dL   Very High Performed at [facility] Lab, [address] [place], [place] [address]    CBC     Status: Abnormal    Collection Time: 08/11/23  6:13 AM  Result Value Ref Range    WBC 11.6 (H) 4.0 - 10.5 K/uL    RBC 3.48 (L) 3.87 - 5.11 MIL/uL    Hemoglobin 12.0 12.0 - 15.0 g/dL    HCT 13.0 (L) 86.5 - 46.0 %    MCV 97.7 80.0 - 100.0 fL    MCH 34.5 (H) 26.0 - 34.0 pg    MCHC 35.3 30.0 - 36.0 g/dL    RDW 78.4 (H) 69.6 - 15.5 %    Platelets 346 150 - 400 K/uL    nRBC 0.0 0.0 - 0.2 %      Comment: Performed at [facility], [address] [place], [place] [address]  Basic metabolic panel with GFR     Status: Abnormal    Collection Time: 08/11/23  6:13 AM  Result Value Ref Range    Sodium 135 135 - 145 mmol/L    Potassium 3.6 3.5 - 5.1 mmol/L    Chloride 101 98 - 111 mmol/L    CO2 25 22 - 32 mmol/L    Glucose, Bld 106 (H) 70 - 99 mg/dL      Comment: Glucose reference range applies only to samples taken after fasting for at least 8 hours.    BUN 9 8 - 23 mg/dL    Creatinine, Ser 4.13 0.44 - 1.00 mg/dL    Calcium  8.7 (L) 8.9 - 10.3 mg/dL    GFR, Estimated >24 >40 mL/min       Comment: (NOTE) Calculated using the CKD-EPI Creatinine Equation (2021)      Anion gap 9 5 - 15      Comment: Performed at [facility], [address] [place], [place] [address]  CBC     Status: Abnormal    Collection Time: 08/12/23  5:07 AM  Result Value Ref Range    WBC 10.6 (H) 4.0 - 10.5 K/uL    RBC 3.60 (L) 3.87 - 5.11 MIL/uL    Hemoglobin 12.4 12.0 - 15.0 g/dL    HCT 60.4 (L) 54.0 - 46.0 %    MCV 98.9 80.0 - 100.0 fL    MCH 34.4 (H) 26.0 - 34.0 pg    MCHC 34.8 30.0 - 36.0 g/dL    RDW 98.1 (H) 19.1 - 15.5 %    Platelets 322 150 - 400 K/uL    nRBC 0.0 0.0 - 0.2 %      Comment: Performed at [facility], [address] [place], [place] [address]  Magnesium     Status: None    Collection Time: 08/12/23  5:07 AM  Result Value Ref Range    Magnesium 2.1 1.7 - 2.4 mg/dL      Comment: Performed at [facility], [address], [place], [place] [address]  Basic metabolic panel with GFR     Status: Abnormal    Collection Time: 08/12/23  5:07 AM  Result Value Ref Range    Sodium 137 135 - 145 mmol/L    Potassium 4.3 3.5 - 5.1 mmol/L    Chloride 105 98 - 111 mmol/L    CO2 26 22 - 32 mmol/L    Glucose, Bld 102 (H) 70 - 99 mg/dL      Comment: Glucose reference range applies only to samples taken after fasting for at least 8 hours.    BUN 9 8 - 23 mg/dL    Creatinine, Ser 0.86 0.44 - 1.00 mg/dL    Calcium  9.0 8.9 - 10.3 mg/dL    GFR, Estimated >57 >84 mL/min      Comment: (NOTE) Calculated using the CKD-EPI Creatinine Equation (2021)      Anion gap 6 5 - 15      Comment: Performed at [facility], [address] [place], [place] [address]       Imaging Results (Last 48 hours)  VAS US  CAROTID Result Date: 08/11/2023 Carotid Arterial Duplex Study Patient Name:  MORGANN WOODBURN  Date of Exam:   08/11/2023 Medical Rec #: [id number]        Accession #:    [id number] Date of Birth: 09-08-1950        Patient Gender: F Patient Age:   56 years Exam Location:   [facility] Procedure:      VAS US  CAROTID Referring Phys: Roxan Copes --------------------------------------------------------------------------------  Risk Factors: Hypertension, hyperlipidemia, coronary artery disease. Performing Technologist: Arlyce Berger RVT, RDMS  Examination Guidelines: A complete evaluation includes B-mode imaging, spectral Doppler, color Doppler, and power Doppler as needed of all accessible portions of each vessel. Bilateral testing is considered an integral part of a complete examination. Limited examinations for reoccurring indications may be performed as noted.  Right Carotid Findings: +----------+--------+--------+--------+------------------+--------+           PSV cm/sEDV cm/sStenosisPlaque DescriptionComments +----------+--------+--------+--------+------------------+--------+ CCA Prox  71      0                                          +----------+--------+--------+--------+------------------+--------+ CCA Distal51      0                                          +----------+--------+--------+--------+------------------+--------+  ICA Prox                  Occluded                           +----------+--------+--------+--------+------------------+--------+ ICA Distal                Occluded                           +----------+--------+--------+--------+------------------+--------+ ECA       144     3                                          +----------+--------+--------+--------+------------------+--------+ +----------+--------+-------+----------------+-------------------+           PSV cm/sEDV cmsDescribe        Arm Pressure (mmHG) +----------+--------+-------+----------------+-------------------+ [id number]            Multiphasic, WNL                    +----------+--------+-------+----------------+-------------------+ +---------+--------+--+--------+--+---------+ VertebralPSV cm/s58EDV cm/s15Antegrade  +---------+--------+--+--------+--+---------+  Left Carotid Findings: +----------+--------+--------+--------+------------------+------------------+           PSV cm/sEDV cm/sStenosisPlaque DescriptionComments           +----------+--------+--------+--------+------------------+------------------+ CCA Prox  114     23                                intimal thickening +----------+--------+--------+--------+------------------+------------------+ CCA Distal78      16                                intimal thickening +----------+--------+--------+--------+------------------+------------------+ ICA Prox  85      19              focal and calcific                   +----------+--------+--------+--------+------------------+------------------+ ICA Distal77      23                                                   +----------+--------+--------+--------+------------------+------------------+ ECA       92      0                                                    +----------+--------+--------+--------+------------------+------------------+ +----------+--------+--------+----------------+-------------------+           PSV cm/sEDV cm/sDescribe        Arm Pressure (mmHG) +----------+--------+--------+----------------+-------------------+ [id number]             Multiphasic, WNL                    +----------+--------+--------+----------------+-------------------+ +---------+--------+--+--------+-+---------+ VertebralPSV cm/s62EDV cm/s6Antegrade +---------+--------+--+--------+-+---------+   Summary: Right Carotid: Evidence consistent with a total occlusion of the right ICA. Left Carotid: The extracranial vessels were near-normal with only minimal wall  thickening or plaque. Vertebrals:  Bilateral vertebral arteries demonstrate antegrade flow. Subclavians: Normal flow hemodynamics were seen in bilateral subclavian              arteries. *See table(s) above for  measurements and observations.  Electronically signed by Runell Countryman on 08/11/2023 at 4:32:35 PM.    Final     ECHOCARDIOGRAM COMPLETE Result Date: 08/11/2023    ECHOCARDIOGRAM REPORT   Patient Name:   Alicia Wilkerson Date of Exam: 08/11/2023 Medical Rec #:  [id number]       Height:       63.0 in Accession #:    [id number]      Weight:       123.7 lb Date of Birth:  [birth year]       BSA:          1.576 m Patient Age:    73 years        BP:           129/79 mmHg Patient Gender: F               HR:           60 bpm. Exam Location:  Inpatient Procedure: 2D Echo, Cardiac Doppler and Color Doppler (Both Spectral and Color            Flow Doppler were utilized during procedure). Indications:    CVA  History:        Patient has prior history of Echocardiogram examinations, most                 recent 11/14/2018.  Sonographer:    Griselda Lederer Referring Phys: [id number] CORTNEY E DE LA TORRE IMPRESSIONS  1. Left ventricular ejection fraction, by estimation, is 60 to 65%. The left ventricle has normal function. The left ventricle has no regional wall motion abnormalities. There is mild left ventricular hypertrophy. Left ventricular diastolic parameters are consistent with Grade I diastolic dysfunction (impaired relaxation). Consider evaluation for infiltrative cardiomyopathy. (I.E HCM, amyloid)  2. Right ventricular systolic function is normal. The right ventricular size is normal. Mildly increased right ventricular wall thickness.  3. Left atrial size was moderately dilated.  4. Small to moderate sized pericardial effusion. No signs of tamponade.  5. The mitral valve is normal in structure. No evidence of mitral valve regurgitation. No evidence of mitral stenosis.  6. The aortic valve is normal in structure. Aortic valve regurgitation is not visualized. No aortic stenosis is present.  7. The inferior vena cava is normal in size with greater than 50% respiratory variability, suggesting right atrial pressure of 3 mmHg. FINDINGS   Left Ventricle: Left ventricular ejection fraction, by estimation, is 60 to 65%. The left ventricle has normal function. The left ventricle has no regional wall motion abnormalities. The left ventricular internal cavity size was normal in size. There is  mild left ventricular hypertrophy. Left ventricular diastolic parameters are consistent with Grade I diastolic dysfunction (impaired relaxation). Right Ventricle: The right ventricular size is normal. Mildly increased right ventricular wall thickness. Right ventricular systolic function is normal. Left Atrium: Left atrial size was moderately dilated. Right Atrium: Right atrial size was normal in size. Pericardium: A moderately sized pericardial effusion is present. Mitral Valve: The mitral valve is normal in structure. No evidence of mitral valve regurgitation. No evidence of mitral valve stenosis. Tricuspid Valve: The tricuspid valve is normal in structure. Tricuspid valve regurgitation is not demonstrated. No evidence of tricuspid stenosis. Aortic Valve: The aortic  valve is normal in structure. There is mild aortic valve annular calcification. Aortic valve regurgitation is not visualized. No aortic stenosis is present. Pulmonic Valve: The pulmonic valve was normal in structure. Pulmonic valve regurgitation is not visualized. No evidence of pulmonic stenosis. Aorta: The aortic root is normal in size and structure. Venous: The inferior vena cava is normal in size with greater than 50% respiratory variability, suggesting right atrial pressure of 3 mmHg. IAS/Shunts: No atrial level shunt detected by color flow Doppler.  LEFT VENTRICLE PLAX 2D LVIDd:         3.00 cm     Diastology LVIDs:         1.70 cm     LV e' medial:    4.35 cm/s LV PW:         1.10 cm     LV E/e' medial:  16.3 LV IVS:        1.30 cm     LV e' lateral:   5.55 cm/s LVOT diam:     1.90 cm     LV E/e' lateral: 12.8 LVOT Area:     2.84 cm  LV Volumes (MOD) LV vol d, MOD A2C: 38.2 ml LV vol d, MOD A4C:  37.5 ml LV vol s, MOD A2C: 13.2 ml LV vol s, MOD A4C: 12.0 ml LV SV MOD A2C:     25.0 ml LV SV MOD A4C:     37.5 ml LV SV MOD BP:      25.0 ml RIGHT VENTRICLE             IVC RV Basal diam:  2.80 cm     IVC diam: 0.90 cm RV S prime:     25.60 cm/s TAPSE (M-mode): 1.4 cm LEFT ATRIUM           Index LA diam:      3.90 cm 2.47 cm/m LA Vol (A4C): 65.8 ml 41.74 ml/m   AORTA Ao Root diam: 3.00 cm Ao Asc diam:  3.20 cm MITRAL VALVE               TRICUSPID VALVE MV Area (PHT): 2.90 cm    TR Peak grad:   19.5 mmHg MV Decel Time: 262 msec    TR Vmax:        221.00 cm/s MV E velocity: 71.10 cm/s MV A velocity: 71.80 cm/s  SHUNTS MV E/A ratio:  0.99        Systemic Diam: 1.90 cm Aditya Sabharwal Electronically signed by Alwin Baars Signature Date/Time: 08/11/2023/9:19:13 AM    Final            Blood pressure 132/64, pulse (!) 51, temperature 97.8 F (36.6 C), temperature source Oral, resp. rate 17, height 5' 3 (1.6 m), weight 58.1 kg, last menstrual period 02/24/2002, SpO2 97%.   Medical Problem List and Plan: 1. Functional deficits secondary to multiple ischemic infarcts right MCA/PCA territory.  Plan 30-day heart monitor at discharge             -patient may shower             -ELOS/Goals: 7 to 10 days, mod I goals PT/OT/SLP  - Stable for admission to inpatient rehab  2.  Antithrombotics: -DVT/anticoagulation:  Pharmaceutical: Lovenox              -antiplatelet therapy: Aspirin  81 mg daily and Plavix 85 mg daily x 3 months then Plavix alone 3. Pain Management: Tylenol  650 mg as needed, benzocaine for  throat discomfort 4. Mood/Behavior/Sleep: Lexapro  10 mg nightly, melatonin 3 mg nightly as needed, Klonopin  0.5 mg twice daily as needed anxiety             -antipsychotic agents: N/A 5. Neuropsych/cognition: This patient is capable of making decisions on her own behalf.  - Sister notes subtle cognitive decline prior to admission; ongoing difficulty with attention, with patient masks by redirecting  conversation or concentrating on what she can do.  No sundowning.  6. Skin/Wound Care: Routine skin checks 7. Fluids/Electrolytes/Nutrition: Routine in and outs with follow-up chemistries 8.  Hyperlipidemia.  Crestor  9.  UTI/sepsis.  IV Rocephin completed. 10.  Episode of PSVT 08/11/2023.  Valsalva maneuvers and PSVT terminated within 3 seconds.  No plan for current beta-blocker due to resting heart rate in the 50s and low 60s.. 11.  Hyperglycemia.  Initial glucose 307.  Hemoglobin A1c 3.7 not consistent with diabetes.  Hyperglycemia likely due to stress from CVA and UTI 12.  CAD.  Low-dose aspirin  prior to admission.   Everlyn Hockey Angiulli, PA-C 08/13/2023  I have examined the patient independently and edited the note for HPI, ROS, exam, assessment, and plan as appropriate. I am in agreement with the above recommendations.   Bea Lime, DO 08/13/2023

## 2023-08-13 NOTE — Progress Notes (Signed)
 Inpatient Rehab Admissions Coordinator:    I have insurance approval and a bed available for pt to admit to CIR today. Dr. Farrel Hones in agreement.  Will let pt/family and TOC team know.   Loye Rumble, PT, DPT Admissions Coordinator [phone] 08/13/23  10:21 AM

## 2023-08-13 NOTE — Discharge Summary (Signed)
 [facility] Phys[facility] Discharge Summary   Patient name: [name]  Admit date:     08/09/2023  Discharge date: 08/13/2023  Attending Physician: Janeane Mealy [[id number]]  Discharge Physician: Unk Garb   PCP: Colene Dauphin, MD  Admitted From: Home  Disposition:  CIR  Recommendations for Outpatient Follow-up:  Follow up with PCP in 1-2 weeks Ambulatory referral made to Stroke Clinic  Home Health:No Equipment/Devices: None  Discharge Condition:Stable CODE STATUS:FULL Diet recommendation: Heart Healthy Fluid Restriction: None  Hospital Summary: HPI: Alicia Wilkerson is a 73 y.o. female with medical history significant for essential pretension, hyperlipidemia, who is admitted to [facility] on 08/09/2023 with acute encephalopathy after presenting from home to [facility] ED complaining of altered mental status.    In setting of the patient's altered mental status, history is provided by patient, as well as her daughter, in addition to my discussions with the EDP ED chart review.   Daughter conveys that the patient has been altered, confused over the last 1 to 2 days.  Daughter, who lives in the state of ER, first noted the patient to be confused on Saturday, 08/08/2023, with last known well occurring on Friday, 08/07/2023.  On Saturday, 08/08/2023, daughter also felt, via FaceTime evaluation, the patient was exhibiting some evidence of facial droop.   The patient reportedly experienced a ground-level fall on Saturday, 08/08/2023, in which she was leaning forward to pick something up, and tripped, resulting in her hitting her head on the floor, without any associated loss of consciousness.  She is on daily baby aspirin  as an outpatient in the absence of any additional blood thinners at home.  Additionally, the patient reportedly fell out of her bed later on Saturday, 08/08/2023, which was also associated with no loss of consciousness.   No known history of diabetes .   Given  ongoing confusion exhibited by the patient, daughter arrived from [place]  earlier today, and initially brought the patient to a local urgent care, before receiving recommendation to present to the ED for further evaluation management of the above.    Significant Events: Admitted 08/09/2023 for acute metabolic encephalopathy due to UTI   Admission Labs: WBC 16.7, HgB 12.6, plt 335 Na 134, K 3.4, CO2 of 23, BUN 14, Scr 0.94, glu 309 UA negative nitrite, Large LE, rare bacteria, WBC >50  Admission Imaging Studies: CT head/face No acute intracranial abnormality. 2.  No acute displaced facial fracture CXR No acute findings  CT head/angio Occlusion of the right ICA just distal to the bifurcation, age indeterminate, but presumably acute given patient's symptoms. Right ICA remains occluded to the terminus. Revascularization of the right MCA via collateral flow across the circle-of-Willis. 2. Fetal type origin of the right PCA with severe stenosis versus  occlusion at the origin of the right PCOM. Right PCA attenuated but patent distally. 3. Mild atheromatous change about the left carotid bulb and left carotid siphon without hemodynamically significant stenosis. 4. Small area of reticulonodular tree-in-bud densities within the left upper lobe, likely mild infection/small airways disease. 5.  Aortic Atherosclerosis  Significant Labs:   Significant Imaging Studies: MRI brain Patchy small volume acute ischemic nonhemorrhagic right PCA territory infarcts as above, with a few additional punctate infarcts within the right frontal lobe. 2. Underlying mildly advanced cerebral atrophy CT head Generalized cerebral atrophy with widening of the extra-axial spaces and ventricular dilatation. 2. The patchy small volume acute right PCA territory infarct seen on the earlier MR head is  not clearly visualized on the current plain brain CT. 3. Mild left ethmoid sinus and mild sphenoid sinus mucosal  thickening.  Antibiotic Therapy: Anti-infectives (From admission, onward)    Start     Dose/Rate Route Frequency Ordered Stop   08/10/23 1400  cefTRIAXone (ROCEPHIN) 1 g in sodium chloride  0.9 % 100 mL IVPB        1 g 200 mL/hr over 30 Minutes Intravenous Every 24 hours 08/09/23 2355     08/09/23 2215  cefTRIAXone (ROCEPHIN) 1 g in sodium chloride  0.9 % 100 mL IVPB        1 g 200 mL/hr over 30 Minutes Intravenous  Once 08/09/23 2205 08/09/23 2309       Procedures:   Consultants: neurology   Hospital Course by Problem: * Acute ischemic stroke (HCC) 08-11-2023 MRI brain positive for ischemic stroke. Echo LVEF 65%. No shunts. No thrombus.  Pt recommended CIR by PT/OT.  CIR has seen patient. Awaiting ins authorization. Discussed with pt and her sister that family may have to stay with her at her home after discharge from CIR or pt may need to go and stay with relatives after CIR.  On crestor  20 mg daily.  Lovenox  for DVT prophylaxis. On ASA and plavix.  08-12-2023 awaiting ins authorization.  08-13-2023 DC recs by neurology:  30 day heart monitor recommended at discharge aspirin  81 mg daily and clopidogrel 75 mg daily for 3 months and then Plavix alone.  If Atrial Fibrillation is diagnosed, would recommend starting OAC, likely eliquis instead.  Continue statin at discharge   Acute cystitis-resolved as of 08/13/2023 08-11-2023 urine cx growing less than 10K CFU. She will complete 3 days of IV rocephin. Today is Day #2.   08-12-2023 completed 3 days of IV rocephin. UTI resolved.  PSVT (paroxysmal supraventricular tachycardia) (HCC) 08-11-2023 pt had episode of PSVT in my presence. HR 120s. Narrow complex tachycardia. I had pt performed Valsalva maneuver. PSVT terminated within about 3 secs.  Will give some po kcl and get her serum K above 4.5. also some mag oxide to get her serum Mg above 2.0. repeat Mg and BMP in AM.  Cannot start betablockers due to resting HR in the high 50s and  low 60s. SBP in the upper 90s.  08-12-2023 stable. Resting HR in high 50 - low 60s. Unable to start any AVN blockers.  08-13-2023 continue with Mg and Kcl supplementation to keep serum Mg >2.0 and K >4.0. will change mag oxide to 400 mg qday due to diarrhea she has had with BID dosing.. continue with kcl 20 meq daily.  Hypokalemia-resolved as of 08/11/2023 08-11-2023 treated on admission. Now resolved.  Severe sepsis (HCC)-resolved as of 08/12/2023 08-11-2023 present on admission. WBC 16K, HR 138. + UA for infection. Now resolved.  Hyperglycemia-resolved as of 08/13/2023 08-11-2023 A1C 3.7%. not consistent with diabetes.  Hyperglycemia likely due to stress from acute CVA and UTI.  08-12-2023 stable. resolved  Essential (primary) hypertension 08-13-2023 continue with cozaar  25 daily at discharge.  ICAO (internal carotid artery occlusion), right 08-12-2023 seen on MRA head/neck and carotid U/S. No intervention required.  CAD (coronary artery disease) 08-11-2023 stable. On ASA, crestor .  08-12-2023 stable.   08-13-2023 continue ASA and crestor  at discharge.  HLD (hyperlipidemia) 08-11-2023 on crestor  20 mg at bedtime.  Lipid panel shows  Lab Results  Component Value Date/Time   CHOL 128 08/11/2023 06:13 AM   TRIG 59 08/11/2023 06:13 AM   HDL 52 08/11/2023 06:13 AM  CHOLHDL 2.5 08/11/2023 06:13 AM   VLDL 12 08/11/2023 06:13 AM   LDLCALC 64 08/11/2023 06:13 AM   08-12-2023 stable.  08-13-2023 continue crestor  at discharge.    Discharge Diagnoses:  Principal Problem:   Acute ischemic stroke [facility]) Active Problems:   PSVT (paroxysmal supraventricular tachycardia) (HCC)   HLD (hyperlipidemia)   CAD (coronary artery disease)   ICAO (internal carotid artery occlusion), right   Essential (primary) hypertension   Discharge Instructions  Discharge Instructions     Ambulatory referral to Neurology   Complete by: As directed    An appointment is requested in  approximately: 4 weeks. Stroke clinic.   Call MD for:  difficulty breathing, headache or visual disturbances   Complete by: As directed    Call MD for:  extreme fatigue   Complete by: As directed    Call MD for:  hives   Complete by: As directed    Call MD for:  persistant dizziness or light-headedness   Complete by: As directed    Call MD for:  persistant nausea and vomiting   Complete by: As directed    Call MD for:  redness, tenderness, or signs of infection (pain, swelling, redness, odor or green/yellow discharge around incision site)   Complete by: As directed    Call MD for:  severe uncontrolled pain   Complete by: As directed    Call MD for:  temperature >100.4   Complete by: As directed    Diet - low sodium heart healthy   Complete by: As directed    Discharge instructions   Complete by: As directed    1. Follow up with your primary care provider in 1-2 weeks following discharge from hospital. 2. Outpatient referral made to neurology stroke clinic. Office will call you for appointment.   Increase activity slowly   Complete by: As directed       Allergies as of 08/13/2023       Reactions   Penicillins Rash   Did it involve swelling of the face/tongue/throat, SOB, or low BP? No Did it involve sudden or severe rash/hives, skin peeling, or any reaction on the inside of your mouth or nose?  #  #  #  YES  #  #  #  Did you need to seek medical attention at a hospital or doctor's office? #  #  #  YES  #  #  #  When did it last happen?   years ago    If all above answers are NO, may proceed with cephalosporin use.   Sulfa Antibiotics Hives, Other (See Comments)   Nickel Hives   Penicillin G Other (See Comments)        Medication List     TAKE these medications    aspirin  EC 81 MG tablet Take 1 tablet (81 mg total) by mouth daily. Swallow whole. What changed:  when to take this additional instructions   clonazePAM  0.5 MG tablet Commonly known as: KLONOPIN  1 tab  by mouth once daily as needed   clopidogrel 75 MG tablet Commonly known as: PLAVIX Take 1 tablet (75 mg total) by mouth daily.   escitalopram  10 MG tablet Commonly known as: LEXAPRO  TAKE ONE TABLET BY MOUTH ONCE A DAY What changed: when to take this   Fish Oil 600 MG Caps Take 2 capsules by mouth at bedtime. What changed: how much to take   losartan  25 MG tablet Commonly known as: COZAAR  Take 0.5 tablets (12.5 mg  total) by mouth at bedtime. What changed:  how much to take when to take this   magnesium oxide 400 (240 Mg) MG tablet Commonly known as: MAG-OX Take 1 tablet (400 mg total) by mouth daily.   potassium chloride SA 20 MEQ tablet Commonly known as: KLOR-CON M Take 1 tablet (20 mEq total) by mouth daily.   rosuvastatin  20 MG tablet Commonly known as: CRESTOR  Take 1 tablet (20 mg total) by mouth daily. What changed: when to take this        Allergies  Allergen Reactions   Penicillins Rash    Did it involve swelling of the face/tongue/throat, SOB, or low BP? No Did it involve sudden or severe rash/hives, skin peeling, or any reaction on the inside of your mouth or nose?  #  #  #  YES  #  #  #  Did you need to seek medical attention at a hospital or doctor's office? #  #  #  YES  #  #  #  When did it last happen?   years ago    If all above answers are NO, may proceed with cephalosporin use.    Sulfa Antibiotics Hives and Other (See Comments)   Nickel Hives   Penicillin G Other (See Comments)    Discharge Exam: Vitals:   08/12/23 2350 08/13/23 0300  BP: 125/74 132/64  Pulse: (!) 56 (!) 51  Resp: 19 17  Temp: 97.7 F (36.5 C) 97.8 F (36.6 C)  SpO2: 95% 97%    Physical Exam Vitals and nursing note reviewed.  Constitutional:      General: She is not in acute distress.    Appearance: She is normal weight. She is not toxic-appearing or diaphoretic.  HENT:     Head: Normocephalic.   Eyes:     General: No scleral icterus.  Pulmonary:      Effort: Pulmonary effort is normal.  Abdominal:     General: There is no distension.   Neurological:     Mental Status: She is alert and oriented to person, place, and time.     The results of significant diagnostics from this hospitalization (including imaging, microbiology, ancillary and laboratory) are listed below for reference.    Microbiology: Recent Results (from the past 240 hours)  Culture, OB Urine     Status: Abnormal   Collection Time: 08/09/23  9:00 PM   Specimen: Urine, Random  Result Value Ref Range Status   Specimen Description URINE, RANDOM  Final   Special Requests NONE  Final   Culture (A)  Final    <10,000 COLONIES/mL INSIGNIFICANT GROWTH NO GROUP B STREP (S.AGALACTIAE) ISOLATED Performed at [facility], [address], [place], [place] [address]    Report Status 08/11/2023 FINAL  Final  Culture, blood (Routine X 2) w Reflex to ID Panel     Status: None (Preliminary result)   Collection Time: 08/10/23  5:52 AM   Specimen: BLOOD  Result Value Ref Range Status   Specimen Description BLOOD LEFT ANTECUBITAL  Final   Special Requests   Final    BOTTLES DRAWN AEROBIC ONLY Blood Culture results may not be optimal due to an inadequate volume of blood received in culture bottles   Culture   Final    NO GROWTH 3 DAYS Performed at [facility], [address], [place], [place] [address]    Report Status PENDING  Incomplete  Culture, blood (Routine X 2) w Reflex to  ID Panel     Status: None (Preliminary result)   Collection Time: 08/10/23  5:57 AM   Specimen: BLOOD  Result Value Ref Range Status   Specimen Description BLOOD LEFT ANTECUBITAL  Final   Special Requests   Final    BOTTLES DRAWN AEROBIC ONLY Blood Culture results may not be optimal due to an inadequate volume of blood received in culture bottles   Culture   Final    NO GROWTH 3 DAYS Performed at [facility], [address], [place], [place] [address]    Report Status PENDING   Incomplete     Labs: Basic Metabolic Panel: Recent Labs  Lab 08/09/23 1747 08/09/23 1801 08/10/23 0542 08/11/23 0613 08/12/23 0507  NA 134* 135 137 135 137  K 3.4* 3.8 4.4 3.6 4.3  CL 98 97* 102 101 105  CO2 23  --  26 25 26   GLUCOSE 309* 300* 105* 106* 102*  BUN 14 16 10 9 9   CREATININE 0.94 0.80 0.63 0.68 0.64  CALCIUM  9.7  --  9.0 8.7* 9.0  MG  --   --  1.9  --  2.1   Liver Function Tests: Recent Labs  Lab 08/09/23 1747 08/10/23 0542 08/10/23 1004  AST 24 18  --   ALT 17 16  --   ALKPHOS 58 45  --   BILITOT 1.6* 1.6* 1.5*  PROT 6.9 6.0*  --   ALBUMIN 3.5 3.0*  --    CBC: Recent Labs  Lab 08/09/23 1747 08/09/23 1801 08/10/23 0542 08/11/23 0613 08/12/23 0507 08/13/23 0450  WBC 16.7*  --  12.8* 11.6* 10.6* 10.6*  NEUTROABS 14.1*  --  9.3*  --   --   --   HGB 12.6 11.6* 12.0 12.0 12.4 11.8*  HCT 36.2 34.0* 33.5* 34.0* 35.6* 33.7*  MCV 100.0  --  97.1 97.7 98.9 99.7  PLT 335  --  300 346 322 301   CBG: Recent Labs  Lab 08/09/23 1851 08/10/23 0937 08/10/23 1204  GLUCAP 205* 108* 96   Hgb A1c Recent Labs    08/10/23 1004  HGBA1C 3.7*   Lipid Profile Recent Labs    08/11/23 0613  CHOL 128  HDL 52  LDLCALC 64  TRIG 59  CHOLHDL 2.5   Thyroid  function studies Recent Labs    08/10/23 1004  TSH 1.594   Anemia work up Recent Labs    08/10/23 1004  VITAMINB12 271   Urinalysis    Component Value Date/Time   COLORURINE AMBER (A) 08/09/2023 2100   APPEARANCEUR CLOUDY (A) 08/09/2023 2100   LABSPEC 1.028 08/09/2023 2100   PHURINE 5.0 08/09/2023 2100   GLUCOSEU >=500 (A) 08/09/2023 2100   HGBUR SMALL (A) 08/09/2023 2100   BILIRUBINUR NEGATIVE 08/09/2023 2100   BILIRUBINUR large (A) 08/09/2023 1706   KETONESUR NEGATIVE 08/09/2023 2100   KETONESUR small (15) (A) 08/09/2023 1706   PROTEINUR 100 (A) 08/09/2023 2100   PROTEINUR >=300 (A) 08/09/2023 1706   UROBILINOGEN 1.0 08/09/2023 1706   NITRITE NEGATIVE 08/09/2023 2100   NITRITE  Negative 08/09/2023 1706   LEUKOCYTESUR LARGE (A) 08/09/2023 2100   LEUKOCYTESUR Trace (A) 08/09/2023 1706   Sepsis Labs Recent Labs  Lab 08/10/23 0542 08/11/23 0613 08/12/23 0507 08/13/23 0450  WBC 12.8* 11.6* 10.6* 10.6*    Procedures/Studies: VAS US  CAROTID Result Date: 08/11/2023 Carotid Arterial Duplex Study Patient Name:  CARMELA PIECHOWSKI  Date of Exam:   08/11/2023 Medical Rec #: [id number]  Accession #:    [id number] Date of Birth: [birth year]        Patient Gender: F Patient Age:   38 years Exam Location:  [facility] Procedure:      VAS US  CAROTID Referring Phys: Jeanella Milan [facility] --------------------------------------------------------------------------------  Risk Factors: Hypertension, hyperlipidemia, coronary artery disease. Performing Technologist: Arlyce Berger RVT, RDMS  Examination Guidelines: A complete evaluation includes B-mode imaging, spectral Doppler, color Doppler, and power Doppler as needed of all accessible portions of each vessel. Bilateral testing is considered an integral part of a complete examination. Limited examinations for reoccurring indications may be performed as noted.  Right Carotid Findings: +----------+--------+--------+--------+------------------+--------+           PSV cm/sEDV cm/sStenosisPlaque DescriptionComments +----------+--------+--------+--------+------------------+--------+ CCA Prox  71      0                                          +----------+--------+--------+--------+------------------+--------+ CCA Distal51      0                                          +----------+--------+--------+--------+------------------+--------+ ICA Prox                  Occluded                           +----------+--------+--------+--------+------------------+--------+ ICA Distal                Occluded                           +----------+--------+--------+--------+------------------+--------+ ECA       144     3                                           +----------+--------+--------+--------+------------------+--------+ +----------+--------+-------+----------------+-------------------+           PSV cm/sEDV cmsDescribe        Arm Pressure (mmHG) +----------+--------+-------+----------------+-------------------+ [id number]            Multiphasic, WNL                    +----------+--------+-------+----------------+-------------------+ +---------+--------+--+--------+--+---------+ VertebralPSV cm/s58EDV cm/s15Antegrade +---------+--------+--+--------+--+---------+  Left Carotid Findings: +----------+--------+--------+--------+------------------+------------------+           PSV cm/sEDV cm/sStenosisPlaque DescriptionComments           +----------+--------+--------+--------+------------------+------------------+ CCA Prox  114     23                                intimal thickening +----------+--------+--------+--------+------------------+------------------+ CCA Distal78      16                                intimal thickening +----------+--------+--------+--------+------------------+------------------+ ICA Prox  85      19              focal and calcific                   +----------+--------+--------+--------+------------------+------------------+  ICA Distal77      23                                                   +----------+--------+--------+--------+------------------+------------------+ ECA       92      0                                                    +----------+--------+--------+--------+------------------+------------------+ +----------+--------+--------+----------------+-------------------+           PSV cm/sEDV cm/sDescribe        Arm Pressure (mmHG) +----------+--------+--------+----------------+-------------------+ [id number]             Multiphasic, WNL                     +----------+--------+--------+----------------+-------------------+ +---------+--------+--+--------+-+---------+ VertebralPSV cm/s62EDV cm/s6Antegrade +---------+--------+--+--------+-+---------+   Summary: Right Carotid: Evidence consistent with a total occlusion of the right ICA. Left Carotid: The extracranial vessels were near-normal with only minimal wall               thickening or plaque. Vertebrals:  Bilateral vertebral arteries demonstrate antegrade flow. Subclavians: Normal flow hemodynamics were seen in bilateral subclavian              arteries. *See table(s) above for measurements and observations.  Electronically signed by Runell Countryman on 08/11/2023 at 4:32:35 PM.    Final    ECHOCARDIOGRAM COMPLETE Result Date: 08/11/2023    ECHOCARDIOGRAM REPORT   Patient Name:   CALENA SALEM Date of Exam: 08/11/2023 Medical Rec #:  [id number]       Height:       63.0 in Accession #:    [id number]      Weight:       123.7 lb Date of Birth:  1950-07-06       BSA:          1.576 m Patient Age:    73 years        BP:           129/79 mmHg Patient Gender: F               HR:           60 bpm. Exam Location:  Inpatient Procedure: 2D Echo, Cardiac Doppler and Color Doppler (Both Spectral and Color            Flow Doppler were utilized during procedure). Indications:    CVA  History:        Patient has prior history of Echocardiogram examinations, most                 recent 11/14/2018.  Sonographer:    Griselda Lederer Referring Phys: [id number] CORTNEY E DE LA TORRE IMPRESSIONS  1. Left ventricular ejection fraction, by estimation, is 60 to 65%. The left ventricle has normal function. The left ventricle has no regional wall motion abnormalities. There is mild left ventricular hypertrophy. Left ventricular diastolic parameters are consistent with Grade I diastolic dysfunction (impaired relaxation). Consider evaluation for infiltrative cardiomyopathy. (I.E HCM, amyloid)  2. Right ventricular systolic function is normal.  The right ventricular size is normal. Mildly increased right ventricular wall thickness.  3. Left  atrial size was moderately dilated.  4. Small to moderate sized pericardial effusion. No signs of tamponade.  5. The mitral valve is normal in structure. No evidence of mitral valve regurgitation. No evidence of mitral stenosis.  6. The aortic valve is normal in structure. Aortic valve regurgitation is not visualized. No aortic stenosis is present.  7. The inferior vena cava is normal in size with greater than 50% respiratory variability, suggesting right atrial pressure of 3 mmHg. FINDINGS  Left Ventricle: Left ventricular ejection fraction, by estimation, is 60 to 65%. The left ventricle has normal function. The left ventricle has no regional wall motion abnormalities. The left ventricular internal cavity size was normal in size. There is  mild left ventricular hypertrophy. Left ventricular diastolic parameters are consistent with Grade I diastolic dysfunction (impaired relaxation). Right Ventricle: The right ventricular size is normal. Mildly increased right ventricular wall thickness. Right ventricular systolic function is normal. Left Atrium: Left atrial size was moderately dilated. Right Atrium: Right atrial size was normal in size. Pericardium: A moderately sized pericardial effusion is present. Mitral Valve: The mitral valve is normal in structure. No evidence of mitral valve regurgitation. No evidence of mitral valve stenosis. Tricuspid Valve: The tricuspid valve is normal in structure. Tricuspid valve regurgitation is not demonstrated. No evidence of tricuspid stenosis. Aortic Valve: The aortic valve is normal in structure. There is mild aortic valve annular calcification. Aortic valve regurgitation is not visualized. No aortic stenosis is present. Pulmonic Valve: The pulmonic valve was normal in structure. Pulmonic valve regurgitation is not visualized. No evidence of pulmonic stenosis. Aorta: The aortic root  is normal in size and structure. Venous: The inferior vena cava is normal in size with greater than 50% respiratory variability, suggesting right atrial pressure of 3 mmHg. IAS/Shunts: No atrial level shunt detected by color flow Doppler.  LEFT VENTRICLE PLAX 2D LVIDd:         3.00 cm     Diastology LVIDs:         1.70 cm     LV e' medial:    4.35 cm/s LV PW:         1.10 cm     LV E/e' medial:  16.3 LV IVS:        1.30 cm     LV e' lateral:   5.55 cm/s LVOT diam:     1.90 cm     LV E/e' lateral: 12.8 LVOT Area:     2.84 cm  LV Volumes (MOD) LV vol d, MOD A2C: 38.2 ml LV vol d, MOD A4C: 37.5 ml LV vol s, MOD A2C: 13.2 ml LV vol s, MOD A4C: 12.0 ml LV SV MOD A2C:     25.0 ml LV SV MOD A4C:     37.5 ml LV SV MOD BP:      25.0 ml RIGHT VENTRICLE             IVC RV Basal diam:  2.80 cm     IVC diam: 0.90 cm RV S prime:     25.60 cm/s TAPSE (M-mode): 1.4 cm LEFT ATRIUM           Index LA diam:      3.90 cm 2.47 cm/m LA Vol (A4C): 65.8 ml 41.74 ml/m   AORTA Ao Root diam: 3.00 cm Ao Asc diam:  3.20 cm MITRAL VALVE               TRICUSPID VALVE MV Area (PHT): 2.90 cm  TR Peak grad:   19.5 mmHg MV Decel Time: 262 msec    TR Vmax:        221.00 cm/s MV E velocity: 71.10 cm/s MV A velocity: 71.80 cm/s  SHUNTS MV E/A ratio:  0.99        Systemic Diam: 1.90 cm Aditya Sabharwal Electronically signed by Alwin Baars Signature Date/Time: 08/11/2023/9:19:13 AM    Final    CT HEAD WO CONTRAST ( ) Result Date: 08/10/2023 CLINICAL DATA:  Headache. EXAM: CT HEAD WITHOUT CONTRAST TECHNIQUE: Contiguous axial images were obtained from the base of the skull through the vertex without intravenous contrast. RADIATION DOSE REDUCTION: This exam was performed according to the departmental dose-optimization program which includes automated exposure control, adjustment of the mA and/or kV according to patient size and/or use of iterative reconstruction technique. COMPARISON:  August 09, 2023, MR head dated August 10, 2023 FINDINGS:  Brain: There is generalized cerebral atrophy with widening of the extra-axial spaces and ventricular dilatation. There are areas of decreased attenuation within the white matter tracts of the supratentorial brain, consistent with microvascular disease changes. The patchy small volume acute right PCA territory infarct seen on the earlier MR head (August 10, 2023) is not clearly visualized on the current plain brain CT. Vascular: Marked severity bilateral cavernous carotid artery calcification is noted. Skull: Normal. Negative for fracture or focal lesion. Sinuses/Orbits: There is mild left ethmoid sinus and mild sphenoid sinus mucosal thickening. Other: None. IMPRESSION: 1. Generalized cerebral atrophy with widening of the extra-axial spaces and ventricular dilatation. 2. The patchy small volume acute right PCA territory infarct seen on the earlier MR head is not clearly visualized on the current plain brain CT. 3. Mild left ethmoid sinus and mild sphenoid sinus mucosal thickening. Electronically Signed   By: Virgle Grime M.D.   On: 08/10/2023 18:28   MR BRAIN WO CONTRAST Result Date: 08/10/2023 CLINICAL DATA:  Initial evaluation for acute neuro deficit, stroke suspected. EXAM: MRI HEAD WITHOUT CONTRAST TECHNIQUE: Multiplanar, multiecho pulse sequences of the brain and surrounding structures were obtained without intravenous contrast. COMPARISON:  CTs from 08/09/2023 FINDINGS: Brain: Mildly advanced cerebral atrophy. No significant cerebral white matter disease for age. 1.2 cm acute ischemic nonhemorrhagic infarct present at the ventral right thalamic capsular region (series 5, image 75). Patchy small volume acute ischemic nonhemorrhagic infarcts at the right occipital lobe, right PCA distribution (series 5, images 68, 70, 73). Mild patchy involvement of the mesial right temporal lobe/right hippocampus (series 5, images 72, 69). Few additional punctate acute ischemic nonhemorrhagic infarcts about the right  frontal lobe (series 5, images 83, 80). No other evidence for acute or subacute infarct. Gray-white matter differentiation otherwise maintained. No acute or chronic intracranial blood products. No mass lesion, midline shift or mass effect. No hydrocephalus or extra-axial fluid collection. Prominent dural calcifications noted along the anterior falx. Pituitary gland within normal limits. Vascular: Major intracranial vascular flow voids are maintained. Skull and upper cervical spine: Craniocervical junction within normal limits. Bone marrow signal intensity normal. No scalp soft tissue abnormality. Sinuses/Orbits: Globes orbital soft tissues within normal limits. Small right maxillary sinus retention cyst noted. Small volume pneumatized secretions noted within the left sphenoid sinus. No significant mastoid effusion. Other: None. IMPRESSION: 1. Patchy small volume acute ischemic nonhemorrhagic right PCA territory infarcts as above, with a few additional punctate infarcts within the right frontal lobe. 2. Underlying mildly advanced cerebral atrophy. Electronically Signed   By: Virgia Griffins M.D.   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 EXAM: CT ANGIOGRAPHY HEAD AND NECK WITH AND WITHOUT CONTRAST TECHNIQUE: Multidetector CT imaging of the head and neck was performed using the standard protocol during bolus administration of intravenous contrast. Multiplanar CT image reconstructions and MIPs were obtained to evaluate the vascular anatomy. Carotid stenosis measurements (when applicable) are obtained utilizing NASCET criteria, using the distal internal carotid diameter as the denominator. RADIATION DOSE REDUCTION: This exam was performed according to the departmental dose-optimization program which includes automated exposure control, adjustment of the mA and/or kV according to patient size and/or use of  iterative reconstruction technique. CONTRAST:  75mL OMNIPAQUE  IOHEXOL  350 MG/ML SOLN COMPARISON:  CT from earlier the same day. FINDINGS: CTA NECK FINDINGS Aortic arch: Visualized aortic arch within normal limits for caliber. Origin of the great vessels incompletely visualized on this exam. Mild aortic atherosclerosis. Right carotid system: Right CCA tortuous but patent without stenosis. Atheromatous change about the right carotid bulb. There is occlusion of the right ICA just distal to the bifurcation (series 7, image 110). Right ICA remains occluded to the terminus. Left carotid system: Left common and internal carotid arteries are tortuous but patent without dissection. Mild atheromatous change about the left carotid bulb without hemodynamically significant stenosis. Vertebral arteries: Left vertebral artery hypoplastic and likely arises from the aortic arch, although the origin is not visualized. Atheromatous change at the origin of the dominant right vertebral artery with mild stenosis. Visualized vertebral arteries patent without stenosis or dissection. Skeleton: No worrisome osseous lesions. Moderate multilevel cervical spondylosis, most pronounced at C5-6 and C6-7. Other neck: No other acute finding. Upper chest: Small area of reticulonodular tree-in-bud densities within the left upper lobe, likely mild infection/small airways disease (series 7, image 138). No other acute finding. Review of the MIP images confirms the above findings CTA HEAD FINDINGS Anterior circulation: For atheromatous change about the left carotid siphon without hemodynamically significant stenosis. Right ICA remains occluded to the terminus. A1 segments patent bilaterally. Left A1 dominant. Normal anterior communicating artery complex. Anterior cerebral arteries patent without stenosis. Left M1 segment and distal left MCA branches are widely patent and well perfused. Revascularization of the right MCA via collateral flow across the  circle-of-Willis. Right MCA patent and perfused. Posterior circulation: Dominant right V4 segment patent without stenosis. Right PICA patent. Hypoplastic left vertebral artery largely terminates in PICA, although a tiny branch ascending towards the vertebrobasilar junction. Left PICA patent as well. Basilar patent without stenosis. Superior cerebral arteries patent bilaterally. Left PCA supplied via a hypoplastic left P1 segment and prominent left posterior communicating artery. Left PCA patent to its distal aspect without significant stenosis. Fetal type origin of the right PCA with severe stenosis versus occlusion at the origin of the right PCOM (series 8, image 107). Right PCA attenuated but patent distally without visible stenosis. Venous sinuses: Patent allowing for timing the contrast bolus. Anatomic variants: As above.  No aneurysm. Review of the MIP images confirms the above findings IMPRESSION: 1. Occlusion of the right ICA just distal to the bifurcation, age indeterminate, but presumably acute given patient's symptoms. Right ICA remains occluded to the terminus. Revascularization of the right MCA via collateral flow across the circle-of-Willis. 2. Fetal type origin of the right PCA with severe stenosis versus occlusion at the origin of the right PCOM. Right PCA attenuated but patent distally. 3. Mild atheromatous change about the left carotid bulb and left carotid siphon  without hemodynamically significant stenosis. 4. Small area of reticulonodular tree-in-bud densities within the left upper lobe, likely mild infection/small airways disease. 5.  Aortic Atherosclerosis (ICD10-I70.0). Critical Value/emergent results were called by telephone at the time of interpretation on 08/09/2023 at 10:40 pm to provider DAN FLOYD , who verbally acknowledged these results. Electronically Signed   By: Virgia Griffins M.D.   On: 08/09/2023 22:43   DG Chest 2 View Result Date: 08/09/2023 EXAM: 2 VIEW(S) XRAY OF THE  CHEST 08/09/2023 09:22:00 PM COMPARISON: 02/23/2022 CLINICAL HISTORY: r/o mass. AMS FINDINGS: LUNGS AND PLEURA: Mild lingular and right middle lobe scarring, chronic. Calcified granuloma in the left lower lung. No pulmonary edema. No pleural effusion. No pneumothorax. HEART AND MEDIASTINUM: No acute abnormality of the cardiac and mediastinal silhouettes. Thoracic aortic atherosclerosis. BONES AND SOFT TISSUES: No acute osseous abnormality. IMPRESSION: 1. No acute findings. Electronically signed by: Zadie Herter MD 08/09/2023 09:28 PM EDT RP Workstation: [id number]   CT HEAD WO CONTRAST Result Date: 08/09/2023 CLINICAL DATA:  Neuro deficit, acute, stroke suspected; Facial trauma, blunt EXAM: CT HEAD WITHOUT CONTRAST CT MAXILLOFACIAL WITHOUT CONTRAST TECHNIQUE: Multidetector CT imaging of the head and maxillofacial structures were performed using the standard protocol without intravenous contrast. Multiplanar CT image reconstructions of the maxillofacial structures were also generated. RADIATION DOSE REDUCTION: This exam was performed according to the departmental dose-optimization program which includes automated exposure control, adjustment of the mA and/or kV according to patient size and/or use of iterative reconstruction technique. COMPARISON:  None Available. FINDINGS: CT HEAD FINDINGS Brain: Atherosclerotic calcifications are present within the cavernous internal carotid arteries. No evidence of large-territorial acute infarction. No parenchymal hemorrhage. No mass lesion. No extra-axial collection. No mass effect or midline shift. No hydrocephalus. Basilar cisterns are patent. Vascular: No hyperdense vessel. Atherosclerotic calcifications are present within the cavernous internal carotid arteries. Skull: No acute fracture or focal lesion. Other: None. CT MAXILLOFACIAL FINDINGS Osseous: No fracture or mandibular dislocation. No destructive process. Sinuses/Orbits: Right maxillary sinus mucosal  thickening. Paranasal sinuses and mastoid air cells are clear. The orbits are unremarkable. Soft tissues: Negative. Visualized upper cervical spine: Degenerative changes. IMPRESSION: 1. No acute intracranial abnormality. 2.  No acute displaced facial fracture. Electronically Signed   By: Morgane  Naveau M.D.   On: 08/09/2023 20:29   CT Maxillofacial Wo Contrast Result Date: 08/09/2023 CLINICAL DATA:  Neuro deficit, acute, stroke suspected; Facial trauma, blunt EXAM: CT HEAD WITHOUT CONTRAST CT MAXILLOFACIAL WITHOUT CONTRAST TECHNIQUE: Multidetector CT imaging of the head and maxillofacial structures were performed using the standard protocol without intravenous contrast. Multiplanar CT image reconstructions of the maxillofacial structures were also generated. RADIATION DOSE REDUCTION: This exam was performed according to the departmental dose-optimization program which includes automated exposure control, adjustment of the mA and/or kV according to patient size and/or use of iterative reconstruction technique. COMPARISON:  None Available. FINDINGS: CT HEAD FINDINGS Brain: Atherosclerotic calcifications are present within the cavernous internal carotid arteries. No evidence of large-territorial acute infarction. No parenchymal hemorrhage. No mass lesion. No extra-axial collection. No mass effect or midline shift. No hydrocephalus. Basilar cisterns are patent. Vascular: No hyperdense vessel. Atherosclerotic calcifications are present within the cavernous internal carotid arteries. Skull: No acute fracture or focal lesion. Other: None. CT MAXILLOFACIAL FINDINGS Osseous: No fracture or mandibular dislocation. No destructive process. Sinuses/Orbits: Right maxillary sinus mucosal thickening. Paranasal sinuses and mastoid air cells are clear. The orbits are unremarkable. Soft tissues: Negative. Visualized upper cervical spine: Degenerative changes. IMPRESSION: 1. No acute intracranial abnormality. 2.  No acute displaced  facial fracture. Electronically Signed   By: Morgane  Naveau M.D.   On: 08/09/2023 20:29    Time coordinating discharge: 55 mins  SIGNED:  Unk Garb, DO [facility] 08/13/23, 9:51 AM

## 2023-08-13 NOTE — Progress Notes (Signed)
   Patient to wear 30 day cardiac event monitor post-stroke. To be read by Dr. Lore Rode, PA-C 08/13/2023 5:19 PM

## 2023-08-13 NOTE — Progress Notes (Signed)
 Inpatient Rehabilitation Admission Medication Review by a Pharmacist  A complete drug regimen review was completed for this patient to identify any potential clinically significant medication issues.  High Risk Drug Classes Is patient taking? Indication by Medication  Antipsychotic No   Anticoagulant Yes Lovenox  - VTE ppx  Antibiotic No   Opioid No   Antiplatelet Yes Aspirin , clopidogrel - CVA  Hypoglycemics/insulin No   Vasoactive Medication No   Chemotherapy No   Other Yes Clonazepam  prn anxiety, sleep Rosuvastatin  - HLD Escitalopram  - mood     Type of Medication Issue Identified Description of Issue Recommendation(s)  Drug Interaction(s) (clinically significant)     Duplicate Therapy     Allergy     No Medication Administration End Date     Incorrect Dose     Additional Drug Therapy Needed     Significant med changes from prior encounter (inform family/care partners about these prior to discharge).    Other       Clinically significant medication issues were identified that warrant physician communication and completion of prescribed/recommended actions by midnight of the next day:  No  Name of provider notified for urgent issues identified:   Provider Method of Notification:     Pharmacist comments: aspirin , clopidogrel planned for 3 months - removed clopidogrel stop time for 18 days)  Time spent performing this drug regimen review (minutes):  30 minutes   Thank you. Lennice Quivers, PharmD

## 2023-08-13 NOTE — TOC Transition Note (Signed)
 Transition of Care (TOC) - Discharge Note Sherin Dingwall RN,BSN Transitions of Care Unit 4NP (Non Trauma)- RN Case Manager See Treatment Team for direct Phone #   Patient Details  Name: Alicia Wilkerson MRN: [id number] Date of Birth: 1950-04-09  Transition of Care [facility]) CM/SW Contact:  Rox Cope, RN Phone Number: 08/13/2023, 10:02 AM   Clinical Narrative:    Per CIR liaison pt has insurance approval for CIR admit and bed available today.   Pt stable for transition to [facility] rehab today, MD has placed d/c order.   No further TOC needs noted, pt will transition this afternoon to CIR bed.    Final next level of care: IP Rehab Facility Barriers to Discharge: Barriers Resolved   Patient Goals and CMS Choice Patient states their goals for this hospitalization and ongoing recovery are:: rehab then return home CMS Medicare.gov Compare Post Acute Care list provided to:: Patient Choice offered to / list presented to : Patient, Sibling      Discharge Placement               [facility]        Discharge Plan and Services Additional resources added to the After Visit Summary for     Discharge Planning Services: CM Consult Post Acute Care Choice: IP Rehab          DME Arranged: N/A DME Agency: NA       HH Arranged: NA HH Agency: NA        Social Drivers of Health (SDOH) Interventions SDOH Screenings   Food Insecurity: No Food Insecurity (08/10/2023)  Housing: Low Risk  (08/10/2023)  Transportation Needs: No Transportation Needs (08/10/2023)  Utilities: Not At Risk (08/10/2023)  Alcohol Screen: Low Risk  (05/01/2023)  Depression (PHQ2-9): Low Risk  (05/01/2023)  Financial Resource Strain: Low Risk  (05/01/2023)  Physical Activity: Insufficiently Active (05/01/2023)  Social Connections: Moderately Isolated (08/10/2023)  Stress: Stress Concern Present (05/01/2023)  Tobacco Use: Low Risk  (08/10/2023)  Health Literacy: Adequate Health Literacy (05/01/2023)      Readmission Risk Interventions    08/13/2023   10:02 AM  Readmission Risk Prevention Plan  Medication Screening Complete  Transportation Screening Complete

## 2023-08-13 NOTE — Progress Notes (Addendum)
 Pt transferred to 4MW01 with all personal belongings. Telemetry removed and CCMD called. PIV clean, dry, intact. Pt transported off unit via wheelchair with RN and NT. Report given to [facility], LPN.

## 2023-08-13 NOTE — Care Management Important Message (Signed)
 Important Message  Patient Details  Name: Alicia Wilkerson MRN: [id number] Date of Birth: 11-Apr-1950   Important Message Given:  Yes - Medicare IM     [name] 08/13/2023, 3:15 PM

## 2023-08-13 NOTE — Progress Notes (Signed)
 Patient ID: Alicia Wilkerson, female   DOB: 20-Aug-1950, 73 y.o.   MRN: [id number]  INPATIENT REHABILITATION ADMISSION NOTE   Arrival Method: wheelchair     Mental Orientation: alert and oriented x4   Assessment: completed   Skin: intact   IV'S: right FA   Pain:denies   Tubes and Drains: none   Safety Measures: educated and implemented   Vital Signs: obtained   Height and Weight: obtained   Rehab Orientation: reviewed and pt in agreement   Family: made aware by pt    Notes:

## 2023-08-13 NOTE — Progress Notes (Signed)
 [name], PT Rehab Admission Coordinator Physical Medicine and Rehabilitation   Progress Notes    Signed   Date of Service: 08/13/2023  4:17 PM   Signed          Bea Lime, DO  Physician Physical Medicine and Rehabilitation   PMR Pre-admission    Signed   Date of Service: 08/11/2023  2:28 PM  Related encounter: ED to Hosp-Admission (Discharged) from 08/09/2023 in [place] [facility]    Signed      Expand All Collapse All  PMR Admission Coordinator Pre-Admission Assessment   Patient: Alicia Wilkerson is an 72 y.o., female MRN: [id number] DOB: 03/25/1950 Height: 5' 3 (160 cm) Weight: 56.1 kg                                                                                                                                                  Insurance Information HMO:     PPO: yes     PCP:      IPA:      80/20:      OTHER:  PRIMARY: [employer] Medicare      Policy#: [id number]       Subscriber: pt CM Name: Sigmund Drew      Phone#: [phone]     Fax#: [phone] Pre-Cert#: [id number] auth for CIR from Sigmund Drew with [employer] Medicare with updates due to fax listed above on 6/25      Employer:  Benefits:  Phone #: [phone]     Name: n/a Eff. Date: 02/25/23     Deduct: $0      Out of Pocket Max: $4150 ($230 met)      Life Max:   CIR: $300/day for days 1-6      SNF: $10/day for days 1-20 Outpatient:      Co-Pay: $10/visit Home Health: 100%      Co-Pay:  DME: 80%     Co-Pay: 20% Providers:  SECONDARY:       Policy#:       Phone#:    Artist:       Phone#:    The Engineer, materials Information Summary" for patients in Inpatient Rehabilitation Facilities with attached "Privacy Act Statement-Health Care Records" was provided and verbally reviewed with: Patient and Family   Emergency Contact Information Contact Information       Name Relation Home Work Mobile    [place] Daughter [phone]   [phone]    CAMEY, EDELL  [phone]   [phone]    Gibson Kurtz [phone]   [phone]         Other Contacts   None on File      Current Medical History  Patient Admitting Diagnosis: CVA   History of Present Illness: Pt is a 73 y/o female with PMH of HTN who presented to [facility] on 08/09/23 with 2 day history of AMS.  Pt's daughter reports pt experienced a ground level fall on 6/14 in which she leaned forward to pick something up, fell and hit her head.  No LOC.  Endorsed a second fall later that day out of the bed.  In ED pt was afebrile, vitals WNL, labs notable for sodium 134, potassium 3.4, glucose 309, bilirubin 1.6, WBC 16.7.  Imaging in the ED notable for occlusion of right ICA just distal to the bifurcation with revascularization of the R MCA via collateral flow across COW.  CTA head/neck also showed R PCA with severe stenosis/occlusion with distal patency.  MRI revealed multiple embolic appearing infarcts in the right MCA and PCA territories.  ED workup also revealed UTI and tachycardia with intermittent rhythm regularity.  Neurology recommended DAPT x3 months, then plavix alone and loop recorder.  Therapy evaluations completed and pt was recommended for CIR.    Complete NIHSS TOTAL: 0 Glasgow Coma Scale Score: 15   Patient's medical record from [facility] has been reviewed by the rehabilitation admission coordinator and physician.   Past Medical History         Past Medical History:  Diagnosis Date   Anemia     Anxiety disorder     Basal cell carcinoma (BCC) of left upper arm 12/2015   Basal cell carcinoma (BCC) of right lower leg 04/2016   Borderline hypertension     CAD (coronary artery disease)      a. nonobst by cor CT 12/2018.   CIN II (cervical intraepithelial neoplasia II)      CIN  1   Depression     Eczema     Family history of premature CAD     History of basal cell carcinoma (BCC) excision      left upper arm 2017;   right lower leg 2018;  bilateral lower leg and  hip area 2019;   inner right lower leg, outer right thigh, & upper outside left arm 09/ 2020   History of cervical dysplasia 2013   History of hereditary spherocytosis      s/p splenectomy in 1954   History of hyperparathyroidism      per pt yrs ago was put on mega dose of vit d which caused the hyperparathyroid resolved after stopping vit d   History of pelvic fracture 2013   MAI (mycobacterium avium-intracellulare) (HCC)      ? possibly by CT 12/2018   Mild hyperlipidemia     Osteoporosis     PONV (postoperative nausea and vomiting)     S/P hernia repair 03/02/2023   S/P splenectomy      spherocytosis   Wears glasses     White coat syndrome without diagnosis of hypertension             Has the patient had major surgery during 100 days prior to admission? No   Family History  family history includes Colon cancer in her mother; Crohn's disease in her daughter; Heart disease in her father and sister; Heart failure in her father; Hypertension in her father; Osteoporosis in her mother; Skin cancer in her father.     Current Medications    Current Medications    Current Facility-Administered Medications:    acetaminophen  (TYLENOL ) tablet 650 mg, 650 mg, Oral, Q6H PRN **OR** acetaminophen  (TYLENOL ) suppository 650 mg, 650 mg, Rectal, Q6H PRN, Howerter, Justin B, DO   aspirin  EC tablet 81 mg, 81 mg, Oral, Daily, Khaliqdina,  Salman, MD, 81 mg at 08/11/23 0905   cefTRIAXone (ROCEPHIN) 1 g in sodium chloride  0.9 % 100 mL IVPB, 1 g, Intravenous, Q24H, Howerter, Justin B, DO, Last Rate: 200 mL/hr at 08/11/23 1414, 1 g at 08/11/23 1414   clopidogrel (PLAVIX) tablet 75 mg, 75 mg, Oral, Daily, Khaliqdina, Salman, MD, 75 mg at 08/11/23 1610   enoxaparin  (LOVENOX ) injection 40 mg, 40 mg, Subcutaneous, Q24H, Wouk, Haynes Lips, MD, 40 mg at 08/10/23 2201   escitalopram  (LEXAPRO ) tablet 10 mg, 10 mg, Oral, QHS, Wouk, Haynes Lips, MD   melatonin tablet 3 mg, 3 mg, Oral, QHS PRN, Howerter, Justin  B, DO   ondansetron  (ZOFRAN ) injection 4 mg, 4 mg, Intravenous, Q6H PRN, Howerter, Justin B, DO   rosuvastatin  (CRESTOR ) tablet 20 mg, 20 mg, Oral, QHS, Howerter, Justin B, DO   sodium chloride  flush (NS) 0.9 % injection 3 mL, 3 mL, Intravenous, Once, Albertus Hughs, DO      Patients Current Diet:  Diet Order                  Diet heart healthy/carb modified Room service appropriate? Yes; Fluid consistency: Thin  Diet effective now                         Precautions / Restrictions Precautions Precautions: Fall Precaution/Restrictions Comments: watch HR elevation, max 182bpm Restrictions Weight Bearing Restrictions Per Provider Order: No    Has the patient had 2 or more falls or a fall with injury in the past year?Yes   Prior Activity Level Community (5-7x/wk): independent with no DME prior to admit, driving, retired   Prior Functional Level Prior Function Prior Level of Function : Independent/Modified Independent, Driving   Self Care: Did the patient need help bathing, dressing, using the toilet or eating?  Independent   Indoor Mobility: Did the patient need assistance with walking from room to room (with or without device)? Independent   Stairs: Did the patient need assistance with internal or external stairs (with or without device)? Independent   Functional Cognition: Did the patient need help planning regular tasks such as shopping or remembering to take medications? Independent   Patient Information Are you of Hispanic, Latino/a,or Spanish origin?: A. No, not of Hispanic, Latino/a, or Spanish origin What is your race?: A. White Do you need or want an interpreter to communicate with a doctor or health care staff?: 0. No   Patient's Response To:  Health Literacy and Transportation Is the patient able to respond to health literacy and transportation needs?: Yes Health Literacy - How often do you need to have someone help you when you read instructions, pamphlets, or  other written material from your doctor or pharmacy?: Never In the past 12 months, has lack of transportation kept you from medical appointments or from getting medications?: No In the past 12 months, has lack of transportation kept you from meetings, work, or from getting things needed for daily living?: No   Home Assistive Devices / Equipment Home Equipment: Agricultural consultant (2 wheels), [employer] - single point, BSC/3in1, Wheelchair - manual, Shower seat (maybe rshower seat, WC, BSC? daughter will check)   Prior Device Use: Indicate devices/aids used by the patient prior to current illness, exacerbation or injury? None of the above   Current Functional Level Cognition      Extremity Assessment (includes Sensation/Coordination)   Upper Extremity Assessment: LUE deficits/detail LUE Deficits / Details: minimally weaker as compared to R. additionally  has notably decr coordination with finger to nose, dysmetria, and dysdiadokinesia testing LUE Coordination: decreased fine motor, decreased gross motor  Lower Extremity Assessment: Defer to PT evaluation RLE Deficits / Details: grossly 4/5 or better. Rt adductor drift noted with SLR RLE Coordination: decreased gross motor, decreased fine motor LLE Coordination: decreased gross motor, decreased fine motor     ADLs   Overall ADL's : Needs assistance/impaired Eating/Feeding: Set up, Sitting Grooming: Contact guard assist, Sitting Upper Body Bathing: Contact guard assist, Sitting Lower Body Bathing: Minimal assistance, Sit to/from stand Upper Body Dressing : Contact guard assist, Sitting Lower Body Dressing: Minimal assistance, Sit to/from stand Toilet Transfer: Minimal assistance, Moderate assistance, +2 for physical assistance, +2 for safety/equipment Functional mobility during ADLs: Minimal assistance, Moderate assistance, Maximal assistance, +2 for safety/equipment, +2 for physical assistance General ADL Comments: LOB with turning in hall  requiring max A to correct     Mobility   Overal bed mobility: Needs Assistance Bed Mobility: Supine to Sit, Sit to Supine Supine to sit: Contact guard, HOB elevated Sit to supine: Contact guard assist General bed mobility comments: cues for safety and lines, no assist     Transfers   Overall transfer level: Needs assistance Equipment used: None Transfers: Sit to/from Stand, Bed to chair/wheelchair/BSC Sit to Stand: Contact guard assist Bed to/from chair/wheelchair/BSC transfer type:: Step pivot Step pivot transfers: Contact guard assist, Min assist General transfer comment: minA to steady with turning to toilet, but pt using UE well to safely lower and rise from toilet     Ambulation / Gait / Stairs / Wheelchair Mobility   Ambulation/Gait Ambulation/Gait assistance: Editor, commissioning (Feet): 15 Feet Assistive device: None Gait Pattern/deviations: Step-through pattern, Decreased stride length, Drifts right/left General Gait Details: limited to short distance ambulation due to HR elevation first to 140bpm then to 182bpm with pt standing in room after using bathroom. the pt was then returned to bed so RN could get EKG. further mobility held, not able to further assess/challenge balance. Gait velocity: decr Gait velocity interpretation: <1.31 ft/sec, indicative of household ambulator     Posture / Balance Balance Overall balance assessment: Needs assistance Sitting-balance support: Feet supported Sitting balance-Leahy Scale: Good Standing balance support: During functional activity, Single extremity supported, No upper extremity supported Standing balance-Leahy Scale: Poor Standing balance comment: not able to fully assess, increased sway with direction changes in bathroom and with turning to flush. Standardized Balance Assessment Standardized Balance Assessment : Dynamic Gait Index Dynamic Gait Index Level Surface: Normal Gait with Horizontal Head Turns: Mild  Impairment Gait and Pivot Turn: Severe Impairment     Special needs/care consideration N/a        Previous Home Environment (from acute therapy documentation) Living Arrangements: Alone Available Help at Discharge: Family Type of Home: House Home Layout: Two level, Bed/bath upstairs Alternate Level Stairs-Rails: Left Alternate Level Stairs-Number of Steps: flight Home Access: Stairs to enter Entrance Stairs-Rails: Right, Left (wide) Entrance Stairs-Number of Steps: 4 Bathroom Shower/Tub: Associate Professor: Yes Home Care Services: No   Discharge Living Setting Plans for Discharge Living Setting: Patient's home (family to stay with her) Type of Home at Discharge: House Discharge Home Layout: Two level, Bed/bath upstairs Alternate Level Stairs-Rails: Left Alternate Level Stairs-Number of Steps: full flight Discharge Home Access: Stairs to enter Entrance Stairs-Rails: Right, Left Entrance Stairs-Number of Steps: 4 Discharge Bathroom Shower/Tub: Tub/shower unit Discharge Bathroom Toilet: Standard Discharge Bathroom Accessibility: Yes How Accessible: Accessible via walker Does  the patient have any problems obtaining your medications?: No   Social/Family/Support Systems Anticipated Caregiver: sister, [name], confirmed caregiver support Anticipated Caregiver's Contact Information: [phone] Ability/Limitations of Caregiver: none stated Caregiver Availability: 24/7 Discharge Plan Discussed with Primary Caregiver: Yes Is Caregiver In Agreement with Plan?: Yes Does Caregiver/Family have Issues with Lodging/Transportation while Pt is in Rehab?: No     Goals Patient/Family Goal for Rehab: PT/OT/SLP mod I Expected length of stay: 7-10 days Additional Information: Discharge plan: return to patient's home, family able to provide 24/7 supervision if needed Pt/Family Agrees to Admission and willing to participate: Yes Program  Orientation Provided & Reviewed with Pt/Caregiver Including Roles  & Responsibilities: Yes     Decrease burden of Care through IP rehab admission: N/A     Possible need for SNF placement upon discharge: Not anticipated. Plan for discharge back to pt's home where family (sisters, friends, etc) can provide supervision if needed.      Patient Condition: This patient's condition remains as documented in the consult dated 08/11/23, in which the Rehabilitation Physician determined and documented that the patient's condition is appropriate for intensive rehabilitative care in an inpatient rehabilitation facility. Will admit to inpatient rehab today.   Preadmission Screen Completed By:  [name], PT, DPT 08/11/2023 2:28 PM ______________________________________________________________________   Discussed status with Dr. Dorn Gaskins on 08/13/23 at  10:25 AM  and received approval for admission today.   Admission Coordinator:  Maynard David E Ashtyn Meland, PT, DPT time 10:25 AM Alanna Hu 08/13/23                Revision History

## 2023-08-13 NOTE — Assessment & Plan Note (Signed)
 08-13-2023 continue with cozaar  25 daily at discharge.

## 2023-08-14 DIAGNOSIS — A499 Bacterial infection, unspecified: Secondary | ICD-10-CM

## 2023-08-14 DIAGNOSIS — N390 Urinary tract infection, site not specified: Secondary | ICD-10-CM

## 2023-08-14 DIAGNOSIS — R42 Dizziness and giddiness: Secondary | ICD-10-CM

## 2023-08-14 LAB — CBC WITH DIFFERENTIAL/PLATELET
Abs Immature Granulocytes: 0.04 10*3/uL (ref 0.00–0.07)
Basophils Absolute: 0.1 10*3/uL (ref 0.0–0.1)
Basophils Relative: 1 %
Eosinophils Absolute: 0.5 10*3/uL (ref 0.0–0.5)
Eosinophils Relative: 6 %
HCT: 33.7 % — ABNORMAL LOW (ref 36.0–46.0)
Hemoglobin: 11.7 g/dL — ABNORMAL LOW (ref 12.0–15.0)
Immature Granulocytes: 1 %
Lymphocytes Relative: 16 %
Lymphs Abs: 1.4 10*3/uL (ref 0.7–4.0)
MCH: 34.9 pg — ABNORMAL HIGH (ref 26.0–34.0)
MCHC: 34.7 g/dL (ref 30.0–36.0)
MCV: 100.6 fL — ABNORMAL HIGH (ref 80.0–100.0)
Monocytes Absolute: 1.3 10*3/uL — ABNORMAL HIGH (ref 0.1–1.0)
Monocytes Relative: 15 %
Neutro Abs: 5.4 10*3/uL (ref 1.7–7.7)
Neutrophils Relative %: 61 %
Platelets: 306 10*3/uL (ref 150–400)
RBC: 3.35 MIL/uL — ABNORMAL LOW (ref 3.87–5.11)
RDW: 16.4 % — ABNORMAL HIGH (ref 11.5–15.5)
WBC: 8.8 10*3/uL (ref 4.0–10.5)
nRBC: 0 % (ref 0.0–0.2)

## 2023-08-14 LAB — COMPREHENSIVE METABOLIC PANEL WITH GFR
ALT: 16 U/L (ref 0–44)
AST: 17 U/L (ref 15–41)
Albumin: 2.8 g/dL — ABNORMAL LOW (ref 3.5–5.0)
Alkaline Phosphatase: 55 U/L (ref 38–126)
Anion gap: 7 (ref 5–15)
BUN: 12 mg/dL (ref 8–23)
CO2: 26 mmol/L (ref 22–32)
Calcium: 8.8 mg/dL — ABNORMAL LOW (ref 8.9–10.3)
Chloride: 105 mmol/L (ref 98–111)
Creatinine, Ser: 0.67 mg/dL (ref 0.44–1.00)
GFR, Estimated: >60 mL/min (ref >60)
Glucose, Bld: 82 mg/dL (ref 70–99)
Potassium: 4.3 mmol/L (ref 3.5–5.1)
Sodium: 138 mmol/L (ref 135–145)
Total Bilirubin: 1.4 mg/dL — ABNORMAL HIGH (ref 0.0–1.2)
Total Protein: 5.9 g/dL — ABNORMAL LOW (ref 6.5–8.1)

## 2023-08-14 MED ORDER — ENSURE MAX PROTEIN PO LIQD
11.0000 [oz_av] | Freq: Every day | ORAL | Status: DC
  Administered 2023-08-14 – 2023-08-18 (×4): 11 [oz_av] via ORAL

## 2023-08-14 MED ORDER — ACETAMINOPHEN 325 MG PO TABS
650.0000 mg | ORAL_TABLET | Freq: Four times a day (QID) | ORAL | Status: AC | PRN
Start: 2023-08-14 — End: ?

## 2023-08-14 NOTE — IPOC Note (Signed)
 Overall Plan of Care [facility]) Patient Details Name: Alicia Wilkerson MRN: [id number] DOB: 1950-09-24  Admitting Diagnosis: Right middle cerebral artery stroke [facility])  Hospital Problems: Principal Problem:   Right middle cerebral artery stroke [facility])     Functional Problem List: Nursing Pain, Bowel, Safety, Endurance, Medication Management  PT Balance, Endurance, Motor, Safety  OT Balance, Vision, Cognition, Safety, Endurance, Motor, Perception  SLP Cognition  TR         Basic ADL's: OT Bathing, Dressing, Toileting     Advanced  ADL's: OT Simple Meal Preparation     Transfers: PT Bed Mobility, Bed to Chair, Customer service manager, Tub/Shower     Locomotion: PT Ambulation, Stairs     Additional Impairments: OT None  SLP Social Cognition   Problem Solving, Memory, Awareness  TR      Anticipated Outcomes Item Anticipated Outcome  Self Feeding independent  Swallowing      Basic self-care  independent  Toileting  independent   Bathroom Transfers modified independent  Bowel/Bladder  manage bowel w mod I assist  Transfers  mod I  Locomotion  mod I  Communication     Cognition  modI  Pain  PAin < 4 with prns  Safety/Judgment  manage safety w cues   Therapy Plan: PT Intensity: Minimum of 1-2 x/day ,45 to 90 minutes PT Frequency: 5 out of 7 days PT Duration Estimated Length of Stay: 7-9 days OT Intensity: Minimum of 1-2 x/day, 45 to 90 minutes OT Duration/Estimated Length of Stay: 3-5 days SLP Intensity: Minumum of 1-2 x/day, 30 to 90 minutes SLP Frequency: 1 to 3 out of 7 days SLP Duration/Estimated Length of Stay: 7-10 days   Team Interventions: Nursing Interventions Patient/Family Education, Pain Management, Discharge Planning, Medication Management, Bowel Management, Disease Management/Prevention  PT interventions Ambulation/gait training, Cognitive remediation/compensation, Warden/ranger, Community reintegration, Discharge planning,  DME/adaptive equipment instruction, Functional mobility training, Psychosocial support, Therapeutic Activities, UE/LE Strength taining/ROM, Visual/perceptual remediation/compensation, Therapeutic Exercise, UE/LE Coordination activities, Stair training, Skin care/wound management, Patient/family education, Neuromuscular re-education, Functional electrical stimulation, Disease management/prevention  OT Interventions Neuromuscular re-education, Patient/family education, Self Care/advanced ADL retraining, Therapeutic Exercise, UE/LE Coordination activities, Cognitive remediation/compensation, Discharge planning, DME/adaptive equipment instruction, Functional mobility training, Therapeutic Activities, UE/LE Strength taining/ROM, Visual/perceptual remediation/compensation, Balance/vestibular training  SLP Interventions Cognitive remediation/compensation, Functional tasks, Therapeutic Activities, Internal/external aids, Patient/family education  TR Interventions    SW/CM Interventions Discharge Planning, Psychosocial Support, Patient/Family Education   Barriers to Discharge MD  none  Nursing Decreased caregiver support, Home environment access/layout 1 level 4 ste bil wide rails solo; dtr to assist  PT Lack of/limited family support, Decreased caregiver support    OT      SLP      SW       Team Discharge Planning: Destination: PT-Home ,OT- Home , SLP-Home Projected Follow-up: PT-Outpatient PT, 24 hour supervision/assistance, OT-  Outpatient OT, SLP-Outpatient SLP Projected Equipment Needs: PT-To be determined, OT- To be determined, SLP-None recommended by SLP Equipment Details: PT- , OT-  Patient/family involved in discharge planning: PT- Patient,  OT-Patient, SLP-Patient  MD ELOS: 3-5 days Medical Rehab Prognosis:  Excellent Assessment: The patient has been admitted for CIR therapies with the diagnosis of right MCA infarct. The team will be addressing functional mobility, strength, stamina,  balance, safety, adaptive techniques and equipment, self-care, bowel and bladder mgt, patient and caregiver education, visual-spatial awareness, community reentry. Goals have been set at mod I to independent. Anticipated discharge destination is home with family/?intermittently alone.  See Team Conference Notes for weekly updates to the plan of care

## 2023-08-14 NOTE — Progress Notes (Signed)
 Inpatient Rehabilitation Care Coordinator Assessment and Plan Patient Details  Name: Alicia Wilkerson MRN: [id number] Date of Birth: 03-19-1950  Today's Date: 08/14/2023  Hospital Problems: Principal Problem:   Right middle cerebral artery stroke [facility])  Past Medical History:  Past Medical History:  Diagnosis Date   Anemia    Anxiety disorder    Basal cell carcinoma (BCC) of left upper arm 12/2015   Basal cell carcinoma (BCC) of right lower leg 04/2016   Borderline hypertension    CAD (coronary artery disease)    a. nonobst by cor CT 12/2018.   CIN II (cervical intraepithelial neoplasia II)    CIN  1   Depression    Eczema    Family history of malignant neoplasm of gastrointestinal tract 08/14/2020   Family history of premature CAD    History of basal cell carcinoma (BCC) excision    left upper arm 2017;   right lower leg 2018;  bilateral lower leg and hip area 2019;   inner right lower leg, outer right thigh, & upper outside left arm 09/ 2020   History of cervical dysplasia 2013   History of hereditary spherocytosis    s/p splenectomy in 1954   History of hyperparathyroidism    per pt yrs ago was put on mega dose of vit d which caused the hyperparathyroid resolved after stopping vit d   History of pelvic fracture 2013   Left inguinal hernia 01/02/2023   MAI (mycobacterium avium-intracellulare) (HCC)    ? possibly by CT 12/2018   Mild hyperlipidemia    Osteoporosis    PONV (postoperative nausea and vomiting)    S/P hernia repair 03/02/2023   S/P splenectomy    spherocytosis   Wears glasses    White coat syndrome without diagnosis of hypertension    Past Surgical History:  Past Surgical History:  Procedure Laterality Date   ANKLE SURGERY  03/14/2015   BREAST BIOPSY Left 08/30/2021   CYSTIC APOCRINE METAPLASIA   CERVICAL CONIZATION W/BX N/A 12/13/2018   Procedure: CONIZATION CERVIX WITH BIOPSY;  Surgeon: Lillian Rein, MD;  Location: [facility] OR;  Service: Gynecology;   Laterality: N/A;   CYSTOSCOPY N/A 10/18/2019   Procedure: CYSTOSCOPY;  Surgeon: Lillian Rein, MD;  Location: [facility];  Service: Gynecology;  Laterality: N/A;   IR RADIOLOGY PERIPHERAL GUIDED IV START  01/05/2019   IR US  GUIDE VASC ACCESS RIGHT  01/05/2019   KNEE SURGERY Left 1991   per pt retained hardward   LEEP N/A 12/13/2018   Procedure: possible LOOP ELECTROSURGICAL EXCISION PROCEDURE (LEEP);  Surgeon: Lillian Rein, MD;  Location: [facility] OR;  Service: Gynecology;  Laterality: N/A;   ORIF ANKLE FRACTURE Left 03/14/2015   per pt retained hardware   ORIF WRIST FRACTURE Left 03/10/2022   Procedure: OPEN REDUCTION INTERNAL FIXATION (ORIF) WRIST FRACTURE;  Surgeon: Arvil Birks, MD;  Location: [facility] OR;  Service: Orthopedics;  Laterality: Left;  regional with iv sedation   SPLENECTOMY, TOTAL  1954   due to spherocytosis   TOTAL LAPAROSCOPIC HYSTERECTOMY WITH SALPINGECTOMY N/A 10/18/2019   Procedure: TOTAL LAPAROSCOPIC HYSTERECTOMY WITH BILATERAL SALPINGECTOMY AND BILATERAL OOPHORECTOMY;  Surgeon: Lillian Rein, MD;  Location: [facility];  Service: Gynecology;  Laterality: N/A;  BILATERAL SALPINGECTOMY POSS OOPHORECTOMY   TUBAL LIGATION Bilateral 1995   Social History:  reports that she has never smoked. She has never used smokeless tobacco. She reports that she does not currently use alcohol. She reports that she does  not use drugs.  Family / Support Systems Marital Status: Widow/Widower Patient Roles: Parent, Other (Comment) (sibling) Children: [name]-daughter [phone]  Kelle Pate [phone] Other Supports: [name] sister [phone] Anticipated Caregiver: Sister and children Ability/Limitations of Caregiver: Will need notice of discharge ate early since all come from out of state Caregiver Availability: 24/7 Family Dynamics: Close knit with three children and sister, all are involved and will come if needed. But also live out of state  Social  History Preferred language: English Religion: None Cultural Background: NA Education: Charity fundraiser - How often do you need to have someone help you when you read instructions, pamphlets, or other written material from your doctor or pharmacy?: Never Writes: Yes Employment Status: Retired Marine scientist Issues: NA Guardian/Conservator: None-according to MD pt is capable of making her own decisions while here   Abuse/Neglect Abuse/Neglect Assessment Can Be Completed: Yes Physical Abuse: Denies Verbal Abuse: Denies Sexual Abuse: Denies Exploitation of patient/patient's resources: Denies Self-Neglect: Denies  Patient response to: Social Isolation - How often do you feel lonely or isolated from those around you?: Rarely  Emotional Status Pt's affect, behavior and adjustment status: Pt is motivated to do well and is recovering from her stroke. She has made progres since admission and is doing well. She has a good outlook and is future focused. Recent Psychosocial Issues: other health issues were being managed and was doing well Psychiatric History: No history/issues pt is doing well and recovering from her stroke Substance Abuse History: NA  Patient / Family Perceptions, Expectations & Goals Pt/Family understanding of illness & functional limitations: Pt is able to explain her stroke and UTI and feels so much better and is actually doing better. She does talk with the MD and feels understands his treatment plan. Premorbid pt/family roles/activities: mom, grandmother, sibling, retiree, etc Anticipated changes in roles/activities/participation: resume Pt/family expectations/goals: Pt states:  I hope to continue to do well and get back to my independent level.  Community [employer] Agencies: None Premorbid Home Care/DME Agencies: Other (Comment) (rw, bsc, wc, tb seat and cane from other family members) Transportation available at discharge: self Is the  patient able to respond to transportation needs?: Yes In the past 12 months, has lack of transportation kept you from medical appointments or from getting medications?: No In the past 12 months, has lack of transportation kept you from meetings, work, or from getting things needed for daily living?: No  Discharge Planning Living Arrangements: Alone Support Systems: Children, Other relatives, Friends/neighbors Type of Residence: Private residence Insurance Resources: Media planner (specify) Radiographer, therapeutic) Financial Resources: Restaurant manager, fast food Screen Referred: No Living Expenses: Banker Management: Patient Does the patient have any problems obtaining your medications?: No Home Management: self Patient/Family Preliminary Plans: Return home with her son who will come from [facility] to be with her and then see who it goes. Has a family reunion 7/4 in [place] and is looking forward to this. Care Coordinator Anticipated Follow Up Needs: HH/OP  Clinical Impression Pleasant female who is actually high level and will be a short length of stay. She is being evlauated today and goals being set for stay here. If hear target dc date will let pt know and call family  Maura Soulier Maralee Senate 08/14/2023, 10:24 AM

## 2023-08-14 NOTE — Progress Notes (Signed)
 Patient ID: Alicia Wilkerson, female   DOB: [birth year], 73 y.o.   MRN: [id number] Met with the patient to review current medical situation, rehab process, team conference and plan of care. Discussed secondary risk management including HTN, HLD (LDL 64/Trig 59) on Crestor  and aortic atherosclerosis/CAD. DAPT x 3 months then Plavix solo.  Reviewed nutritional supplements; Magnesium and Potassium related to PSVT management and HH diet modification.   Denies insomnia or pain. Reported family reunion scheduled July 4th and would like to be discharged from [facility] in time to prepare for the trip. Continue to follow along to address educational needs to facilitate preparation for discharge. Naoma Bacca

## 2023-08-14 NOTE — Evaluation (Addendum)
 Speech Language Pathology Assessment and Plan  Patient Details  Name: Alicia Wilkerson MRN: [id number] Date of Birth: February 03, 1951  SLP Diagnosis: Cognitive Impairments  Rehab Potential: Good ELOS: 7-10 days   Today's Date: 08/14/2023 SLP Individual Time: 1100-1154  54 min  Hospital Problem: Principal Problem:   Right middle cerebral artery stroke [facility])  Past Medical History:  Past Medical History:  Diagnosis Date   Anemia    Anxiety disorder    Basal cell carcinoma (BCC) of left upper arm 12/2015   Basal cell carcinoma (BCC) of right lower leg 04/2016   Borderline hypertension    CAD (coronary artery disease)    a. nonobst by cor CT 12/2018.   CIN II (cervical intraepithelial neoplasia II)    CIN  1   Depression    Eczema    Family history of malignant neoplasm of gastrointestinal tract 08/14/2020   Family history of premature CAD    History of basal cell carcinoma (BCC) excision    left upper arm 2017;   right lower leg 2018;  bilateral lower leg and hip area 2019;   inner right lower leg, outer right thigh, & upper outside left arm 09/ 2020   History of cervical dysplasia 2013   History of hereditary spherocytosis    s/p splenectomy in 1954   History of hyperparathyroidism    per pt yrs ago was put on mega dose of vit d which caused the hyperparathyroid resolved after stopping vit d   History of pelvic fracture 2013   Left inguinal hernia 01/02/2023   MAI (mycobacterium avium-intracellulare) (HCC)    ? possibly by CT 12/2018   Mild hyperlipidemia    Osteoporosis    PONV (postoperative nausea and vomiting)    S/P hernia repair 03/02/2023   S/P splenectomy    spherocytosis   Wears glasses    White coat syndrome without diagnosis of hypertension    Past Surgical History:  Past Surgical History:  Procedure Laterality Date   ANKLE SURGERY  03/14/2015   BREAST BIOPSY Left 08/30/2021   CYSTIC APOCRINE METAPLASIA   CERVICAL CONIZATION W/BX N/A 12/13/2018   Procedure:  CONIZATION CERVIX WITH BIOPSY;  Surgeon: Lillian Rein, MD;  Location: [facility] OR;  Service: Gynecology;  Laterality: N/A;   CYSTOSCOPY N/A 10/18/2019   Procedure: CYSTOSCOPY;  Surgeon: Lillian Rein, MD;  Location: [facility];  Service: Gynecology;  Laterality: N/A;   IR RADIOLOGY PERIPHERAL GUIDED IV START  01/05/2019   IR US  GUIDE VASC ACCESS RIGHT  01/05/2019   KNEE SURGERY Left 1991   per pt retained hardward   LEEP N/A 12/13/2018   Procedure: possible LOOP ELECTROSURGICAL EXCISION PROCEDURE (LEEP);  Surgeon: Lillian Rein, MD;  Location: [facility] OR;  Service: Gynecology;  Laterality: N/A;   ORIF ANKLE FRACTURE Left 03/14/2015   per pt retained hardware   ORIF WRIST FRACTURE Left 03/10/2022   Procedure: OPEN REDUCTION INTERNAL FIXATION (ORIF) WRIST FRACTURE;  Surgeon: Arvil Birks, MD;  Location: [facility] OR;  Service: Orthopedics;  Laterality: Left;  regional with iv sedation   SPLENECTOMY, TOTAL  1954   due to spherocytosis   TOTAL LAPAROSCOPIC HYSTERECTOMY WITH SALPINGECTOMY N/A 10/18/2019   Procedure: TOTAL LAPAROSCOPIC HYSTERECTOMY WITH BILATERAL SALPINGECTOMY AND BILATERAL OOPHORECTOMY;  Surgeon: Lillian Rein, MD;  Location: [facility];  Service: Gynecology;  Laterality: N/A;  BILATERAL SALPINGECTOMY POSS OOPHORECTOMY   TUBAL LIGATION Bilateral 1995    Assessment / Plan / Recommendation Clinical Impression  Pt is a 73 y/o female with PMH of HTN who presented to [facility] with 2 day history of AMS. Pt's daughter reports pt experienced a ground level fall on 6/14 in which she leaned forward to pick something up, fell and hit her head. No LOC. Endorsed a second fall later that day out of the bed. In ED pt was afebrile, vitals WNL, labs notable for sodium 134, potassium 3.4, glucose 309, bilirubin 1.6, WBC 16.7. Imaging in the ED notable for occlusion of right ICA just distal to the bifurcation with revascularization of the R MCA via collateral flow  across COW. CTA head/neck also showed R PCA with severe stenosis/occlusion with distal patency. MRI revealed multiple embolic appearing infarcts in the right MCA and PCA territories. ED workup also revealed UTI and tachycardia with intermittent rhythm regularity. Neurology recommended DAPT x3 months, then plavix alone and loop recorder. Therapy evaluations completed and pt was recommended for CIR.   Cognitive-Linguistic Evaluation: Patient presents with high-level cognitive deficits in the domains of delayed memory and executive functioning, specifically evident during asks requiring organization and complex sequencing and/or working memory. Patient indicates that she has had trouble navigating her previously established memory compensatory strategies (i.e. phone calendar, password lists, etc.) but has not noticed any other changes to cognition. SLP administered RBANS examination with patient scoring 'below average' on immediate memory, visuospatial/constructional, attention, and language subtests and 'impaired' on delayed memory subtest. Overall, patient scored within the 'impaired' range for her age group. Patient identified errors in TID pillbox with min assist. During navigation of phone calendar, benefited from min assist for organization of problem solving solutions. Patient would benefit from continued SLP services targeting aforementioned deficits. Patient was left in chair with call bell in reach and chair alarm set. SLP will continue to target goals per plan of care.     Skilled Therapeutic Interventions          SLP conducted skilled evaluation session to assess cognitive-linguistic function. Utilized RBANS and patient and/or family interview. Full results above.   SLP Assessment  Patient will need skilled Speech Lanaguage Pathology Services during CIR admission    Recommendations  Patient destination: Home Follow up Recommendations: Outpatient SLP Equipment Recommended: None recommended by  SLP    SLP Frequency 1 to 3 out of 7 days   SLP Duration  SLP Intensity  SLP Treatment/Interventions 7-10 days  Minumum of 1-2 x/day, 30 to 90 minutes  Cognitive remediation/compensation;Functional tasks;Therapeutic Activities;Internal/external aids;Patient/family education    Pain Pain Assessment Pain Scale: Faces Pain Score: 8  Faces Pain Scale: No hurt Pain Location: Teeth Pain Intervention(s): Medication (See eMAR)  Prior Functioning Cognitive/Linguistic Baseline: Within functional limits Type of Home: House  Lives With: Alone Available Help at Discharge: Family Vocation: Retired  SLP Evaluation Cognition Overall Cognitive Status: Impaired/Different from baseline Arousal/Alertness: Awake/alert Orientation Level: Oriented X4 Year: 2025 Month: June Day of Week: Correct Attention: Sustained Sustained Attention: Appears intact Memory: Impaired Memory Impairment: Retrieval deficit;Storage deficit Awareness: Impaired Awareness Impairment: Intellectual impairment;Emergent impairment;Anticipatory impairment Problem Solving: Impaired Problem Solving Impairment: Verbal complex;Functional complex Executive Function: Sequencing;Organizing Sequencing: Impaired Sequencing Impairment: Functional complex Organizing: Impaired Organizing Impairment: Functional complex Safety/Judgment: Appears intact  Comprehension Auditory Comprehension Overall Auditory Comprehension: Appears within functional limits for tasks assessed Expression Expression Primary Mode of Expression: Verbal Verbal Expression Overall Verbal Expression: Appears within functional limits for tasks assessed Written Expression Dominant Hand: Right Oral Motor Oral Motor/Sensory Function Overall Oral Motor/Sensory Function: Within functional limits Motor Speech  Overall Motor Speech: Appears within functional limits for tasks assessed  Care Tool Care Tool Cognition Ability to hear (with hearing aid or  hearing appliances if normally used Ability to hear (with hearing aid or hearing appliances if normally used): 0. Adequate - no difficulty in normal conservation, social interaction, listening to TV   Expression of Ideas and Wants Expression of Ideas and Wants: 4. Without difficulty (complex and basic) - expresses complex messages without difficulty and with speech that is clear and easy to understand   Understanding Verbal and Non-Verbal Content Understanding Verbal and Non-Verbal Content: 4. Understands (complex and basic) - clear comprehension without cues or repetitions  Memory/Recall Ability Memory/Recall Ability : Current season;Location of own room;Staff names and faces;That he or she is in a hospital/hospital unit   Short Term Goals: Week 1: SLP Short Term Goal 1 (Week 1): STGs=LTGs d/t ELOS  Refer to Care Plan for Long Term Goals  Recommendations for other services: None   Discharge Criteria: Patient will be discharged from SLP if patient refuses treatment 3 consecutive times without medical reason, if treatment goals not met, if there is a change in medical status, if patient makes no progress towards goals or if patient is discharged from hospital.  The above assessment, treatment plan, treatment alternatives and goals were discussed and mutually agreed upon: by patient  Mikel Pyon, M.A., CCC-SLP  [place] Monds A Oniya Mandarino 08/14/2023, 12:29 PM

## 2023-08-14 NOTE — Progress Notes (Addendum)
 PROGRESS NOTE   Subjective/Complaints: Pt up at EOB with OT. Feeling well. OT reports session went well. Did experience a little bit of dizziness  ROS: Patient denies fever, rash, sore throat, blurred vision,  nausea, vomiting, diarrhea, cough, shortness of breath or chest pain, joint or back/neck pain, headache, or mood change.    Objective:   No results found. Recent Labs    08/13/23 0450 08/14/23 0632  WBC 10.6* 8.8  HGB 11.8* 11.7*  HCT 33.7* 33.7*  PLT 301 306   Recent Labs    08/12/23 0507 08/14/23 0632  NA 137 138  K 4.3 4.3  CL 105 105  CO2 26 26  GLUCOSE 102* 82  BUN 9 12  CREATININE 0.64 0.67  CALCIUM  9.0 8.8*    Intake/Output Summary (Last 24 hours) at 08/14/2023 1018 Last data filed at 08/14/2023 0824 Gross per 24 hour  Intake 390 ml  Output --  Net 390 ml        Physical Exam: Vital Signs Blood pressure 121/64, pulse 81, temperature 98 F (36.7 C), temperature source Oral, resp. rate 18, height 5' 3 (1.6 m), weight 56.6 kg, last menstrual period 02/24/2002, SpO2 98%.  General: Alert and oriented x 3, No apparent distress HEENT: Head is normocephalic, atraumatic, PERRLA, EOMI, sclera anicteric, oral mucosa pink and moist, dentition intact, ext ear canals clear,  Neck: Supple without JVD or lymphadenopathy Heart: Reg rate and rhythm. No murmurs rubs or gallops Chest: CTA bilaterally without wheezes, rales, or rhonchi; no distress Abdomen: Soft, non-tender, non-distended, bowel sounds positive. Extremities: No clubbing, cyanosis, or edema. Pulses are 2+ Psych: Pt's affect is appropriate. Pt is cooperative Skin: Clean and intact without signs of breakdown. A few abrasions Neuro:  Alert and oriented x 3. Normal insight and awareness. Intact Memory. Normal language and speech. Cranial nerve exam unremarkable. MMT: RUE and RLE 5/5 prox to distal. LUE 4/5 delt, bicep, tricep, 4+ wrist and hand. LLE  5- to 5/5 prox to distal. Sensory exam normal for light touch and pain in all 4 limbs. No limb ataxia or cerebellar signs. No abnormal tone appreciated. No pronator drift.   Musculoskeletal: Full ROM, No pain with AROM or PROM in the neck, trunk, or extremities. Posture appropriate     Assessment/Plan: 1. Functional deficits which require 3+ hours per day of interdisciplinary therapy in a comprehensive inpatient rehab setting. Physiatrist is providing close team supervision and 24 hour management of active medical problems listed below. Physiatrist and rehab team continue to assess barriers to discharge/monitor patient progress toward functional and medical goals  Care Tool:  Bathing              Bathing assist       Upper Body Dressing/Undressing Upper body dressing        Upper body assist      Lower Body Dressing/Undressing Lower body dressing            Lower body assist       Toileting Toileting    Toileting assist       Transfers Chair/bed transfer  Transfers assist           Locomotion  Ambulation   Ambulation assist              Walk 10 feet activity   Assist           Walk 50 feet activity   Assist           Walk 150 feet activity   Assist           Walk 10 feet on uneven surface  activity   Assist           Wheelchair     Assist               Wheelchair 50 feet with 2 turns activity    Assist            Wheelchair 150 feet activity     Assist          Blood pressure 121/64, pulse 81, temperature 98 F (36.7 C), temperature source Oral, resp. rate 18, height 5' 3 (1.6 m), weight 56.6 kg, last menstrual period 02/24/2002, SpO2 98%.  Medical Problem List and Plan: 1. Functional deficits secondary to multiple ischemic infarcts right MCA/PCA territory.  Plan 30-day heart monitor at discharge             -patient may shower             -ELOS/Goals: 7 to 10 days, mod I  goals PT/OT/SLP             -Patient is beginning CIR therapies today including PT, OT, and SLP   2.  Antithrombotics: -DVT/anticoagulation:  Pharmaceutical: Lovenox              -antiplatelet therapy: Aspirin  81 mg daily and Plavix 85 mg daily x 3 months then Plavix alone 3. Pain Management: Tylenol  650 mg as needed, benzocaine for throat discomfort 4. Hx of depression/Behavior/Sleep: Lexapro  10 mg nightly, melatonin 3 mg nightly as needed, Klonopin  0.5 mg twice daily as needed anxiety             -antipsychotic agents: N/A  -mood appropriate today. Slept well 5. Neuropsych/cognition: This patient is capable of making decisions on her own behalf.             - Sister notes subtle cognitive decline prior to admission; ongoing difficulty with attention, with patient masks by redirecting conversation or concentrating on what she can do.  No sundowning.   -6/20 SLP eval today. Pt seemed quite appropriate today on my eval.  6. Skin/Wound Care: Routine skin checks 7. Fluids/Electrolytes/Nutrition: encourage PO  I personally reviewed the patient's labs today.    -albumin 2.8---protein supp 8.  Hyperlipidemia.  Crestor  9.  UTI/sepsis.  IV Rocephin completed. 10.  Episode of PSVT 08/11/2023.  Valsalva maneuvers and PSVT terminated within 3 seconds.  No plan for current beta-blocker due to resting heart rate in the 50s and low 60s.. 11.  Hyperglycemia.  Initial glucose 307.  Hemoglobin A1c 3.7 not consistent with diabetes.  Hyperglycemia likely due to stress from CVA and UTI   -no cbg's needed 12.  CAD.  Low-dose aspirin  prior to admission. 13. Dizziness during OT this morning 6/20 - BP ok per OT 2 min after sx    -BP's in general have been in range over last few days    -monitor for any further sx            -encourage adequate fluids  LOS: 1 days A FACE TO FACE EVALUATION WAS PERFORMED  Rawland Caddy  08/14/2023, 10:18 AM

## 2023-08-14 NOTE — Plan of Care (Signed)
  Problem: RH Balance Goal: LTG Patient will maintain dynamic standing with ADLs (OT) Description: LTG:  Patient will maintain dynamic standing balance with assist during activities of daily living (OT)  Flowsheets (Taken 08/14/2023 1323) LTG: Pt will maintain dynamic standing balance during ADLs with: Independent   Problem: RH Bathing Goal: LTG Patient will bathe all body parts with assist levels (OT) Description: LTG: Patient will bathe all body parts with assist levels (OT) Flowsheets (Taken 08/14/2023 1323) LTG: Pt will perform bathing with assistance level/cueing: Independent with assistive device  LTG: Position pt will perform bathing: Shower   Problem: RH Dressing Goal: LTG Patient will perform upper body dressing (OT) Description: LTG Patient will perform upper body dressing with assist, with/without cues (OT). Flowsheets (Taken 08/14/2023 1323) LTG: Pt will perform upper body dressing with assistance level of: Independent Goal: LTG Patient will perform lower body dressing w/assist (OT) Description: LTG: Patient will perform lower body dressing with assist, with/without cues in positioning using equipment (OT) Flowsheets (Taken 08/14/2023 1323) LTG: Pt will perform lower body dressing with assistance level of: Independent   Problem: RH Toileting Goal: LTG Patient will perform toileting task (3/3 steps) with assistance level (OT) Description: LTG: Patient will perform toileting task (3/3 steps) with assistance level (OT)  Flowsheets (Taken 08/14/2023 1323) LTG: Pt will perform toileting task (3/3 steps) with assistance level: Independent   Problem: RH Simple Meal Prep Goal: LTG Patient will perform simple meal prep w/assist (OT) Description: LTG: Patient will perform simple meal prep with assistance, with/without cues (OT). Flowsheets (Taken 08/14/2023 1323) LTG: Pt will perform simple meal prep with assistance level of: Supervision/Verbal cueing LTG: Pt will perform simple meal  prep w/level of: Ambulate without device   Problem: RH Toilet Transfers Goal: LTG Patient will perform toilet transfers w/assist (OT) Description: LTG: Patient will perform toilet transfers with assist, with/without cues using equipment (OT) Flowsheets (Taken 08/14/2023 1323) LTG: Pt will perform toilet transfers with assistance level of: Independent   Problem: RH Tub/Shower Transfers Goal: LTG Patient will perform tub/shower transfers w/assist (OT) Description: LTG: Patient will perform tub/shower transfers with assist, with/without cues using equipment (OT) Flowsheets (Taken 08/14/2023 1323) LTG: Pt will perform tub/shower stall transfers with assistance level of: Independent with assistive device LTG: Pt will perform tub/shower transfers from: Walk in shower   Problem: RH Attention Goal: LTG Patient will demonstrate this level of attention during functional activites (OT) Description: LTG:  Patient will demonstrate this level of attention during functional activites  (OT) Flowsheets (Taken 08/14/2023 1323) Patient will demonstrate this level of attention during functional activites: Alternating Patient will demonstrate above attention level in the following environment: Home LTG: Patient will demonstrate this level of attention during functional activites (OT): Minimal Assistance - Patient > 75%   Problem: RH Awareness Goal: LTG: Patient will demonstrate awareness during functional activites type of (OT) Description: LTG: Patient will demonstrate awareness during functional activites type of (OT) Flowsheets (Taken 08/14/2023 1323) Patient will demonstrate awareness during functional activites type of: Anticipatory LTG: Patient will demonstrate awareness during functional activites type of (OT): Minimal Assistance - Patient > 75%

## 2023-08-14 NOTE — Progress Notes (Signed)
 Inpatient Rehabilitation  Patient information reviewed and entered into eRehab system by [facility]. Karen Kays., CCC/SLP, PPS Coordinator.  Information including medical coding, functional ability and quality indicators will be reviewed and updated through discharge.

## 2023-08-14 NOTE — Progress Notes (Signed)
 Inpatient Rehabilitation Center Individual Statement of Services  Patient Name:  Alicia Wilkerson  Date:  08/14/2023  Welcome to the Inpatient Rehabilitation Center.  Our goal is to provide you with an individualized program based on your diagnosis and situation, designed to meet your specific needs.  With this comprehensive rehabilitation program, you will be expected to participate in at least 3 hours of rehabilitation therapies Monday-Friday, with modified therapy programming on the weekends.  Your rehabilitation program will include the following services:  Physical Therapy (PT), Occupational Therapy (OT), Speech Therapy (ST), 24 hour per day rehabilitation nursing, Therapeutic Recreaction (TR), Care Coordinator, Rehabilitation Medicine, Nutrition Services, and Pharmacy Services  Weekly team conferences will be held on Wednesday to discuss your progress.  Your Inpatient Rehabilitation Care Coordinator will talk with you frequently to get your input and to update you on team discussions.  Team conferences with you and your family in attendance may also be held.  Expected length of stay:7 days  Overall anticipated outcome: independent level  Depending on your progress and recovery, your program may change. Your Inpatient Rehabilitation Care Coordinator will coordinate services and will keep you informed of any changes. Your Inpatient Rehabilitation Care Coordinator's name and contact numbers are listed  below.  The following services may also be recommended but are not provided by the Inpatient Rehabilitation Center:  Driving Evaluations Home Health Rehabiltiation Services Outpatient Rehabilitation Services    Arrangements will be made to provide these services after discharge if needed.  Arrangements include referral to agencies that provide these services.  Your insurance has been verified to be:  [employer] Your primary doctor is:  Oma Bias  Pertinent information will be shared  with your doctor and your insurance company.  Inpatient Rehabilitation Care Coordinator:  Adrianna Albee, [place] [phone] or Justine Oms  Information discussed with and copy given to patient by: Mardell Shade, 08/14/2023, 10:27 AM

## 2023-08-14 NOTE — Evaluation (Signed)
 Physical Therapy Assessment and Plan  Patient Details  Name: Alicia Wilkerson MRN: [id number] Date of Birth: 01/19/1951  PT Diagnosis: Abnormality of gait, Cognitive deficits, Difficulty walking, Hemiplegia non-dominant, Impaired cognition, and Muscle weakness Rehab Potential: Excellent ELOS: 7-9 days   Today's Date: 08/14/2023 PT Individual Time: 1300-1415 PT Individual Time Calculation (min): 75 min    Hospital Problem: Principal Problem:   Right middle cerebral artery stroke [facility])   Past Medical History:  Past Medical History:  Diagnosis Date   Anemia    Anxiety disorder    Basal cell carcinoma (BCC) of left upper arm 12/2015   Basal cell carcinoma (BCC) of right lower leg 04/2016   Borderline hypertension    CAD (coronary artery disease)    a. nonobst by cor CT 12/2018.   CIN II (cervical intraepithelial neoplasia II)    CIN  1   Depression    Eczema    Family history of malignant neoplasm of gastrointestinal tract 08/14/2020   Family history of premature CAD    History of basal cell carcinoma (BCC) excision    left upper arm 2017;   right lower leg 2018;  bilateral lower leg and hip area 2019;   inner right lower leg, outer right thigh, & upper outside left arm 09/ 2020   History of cervical dysplasia 2013   History of hereditary spherocytosis    s/p splenectomy in 1954   History of hyperparathyroidism    per pt yrs ago was put on mega dose of vit d which caused the hyperparathyroid resolved after stopping vit d   History of pelvic fracture 2013   Left inguinal hernia 01/02/2023   MAI (mycobacterium avium-intracellulare) (HCC)    ? possibly by CT 12/2018   Mild hyperlipidemia    Osteoporosis    PONV (postoperative nausea and vomiting)    S/P hernia repair 03/02/2023   S/P splenectomy    spherocytosis   Wears glasses    White coat syndrome without diagnosis of hypertension    Past Surgical History:  Past Surgical History:  Procedure Laterality Date   ANKLE  SURGERY  03/14/2015   BREAST BIOPSY Left 08/30/2021   CYSTIC APOCRINE METAPLASIA   CERVICAL CONIZATION W/BX N/A 12/13/2018   Procedure: CONIZATION CERVIX WITH BIOPSY;  Surgeon: Lillian Rein, MD;  Location: [facility] OR;  Service: Gynecology;  Laterality: N/A;   CYSTOSCOPY N/A 10/18/2019   Procedure: CYSTOSCOPY;  Surgeon: Lillian Rein, MD;  Location: [facility];  Service: Gynecology;  Laterality: N/A;   IR RADIOLOGY PERIPHERAL GUIDED IV START  01/05/2019   IR US  GUIDE VASC ACCESS RIGHT  01/05/2019   KNEE SURGERY Left 1991   per pt retained hardward   LEEP N/A 12/13/2018   Procedure: possible LOOP ELECTROSURGICAL EXCISION PROCEDURE (LEEP);  Surgeon: Lillian Rein, MD;  Location: [facility] OR;  Service: Gynecology;  Laterality: N/A;   ORIF ANKLE FRACTURE Left 03/14/2015   per pt retained hardware   ORIF WRIST FRACTURE Left 03/10/2022   Procedure: OPEN REDUCTION INTERNAL FIXATION (ORIF) WRIST FRACTURE;  Surgeon: Arvil Birks, MD;  Location: [facility] OR;  Service: Orthopedics;  Laterality: Left;  regional with iv sedation   SPLENECTOMY, TOTAL  1954   due to spherocytosis   TOTAL LAPAROSCOPIC HYSTERECTOMY WITH SALPINGECTOMY N/A 10/18/2019   Procedure: TOTAL LAPAROSCOPIC HYSTERECTOMY WITH BILATERAL SALPINGECTOMY AND BILATERAL OOPHORECTOMY;  Surgeon: Lillian Rein, MD;  Location: [facility];  Service: Gynecology;  Laterality: N/A;  BILATERAL SALPINGECTOMY POSS OOPHORECTOMY  TUBAL LIGATION Bilateral 1995    Assessment & Plan Clinical Impression: Patient is a 73 year old right handed female with history significant for essential hypertension, hyperlipidemia, anxiety/depression disorder, CAD maintained on low-dose aspirin , history of basal cell carcinoma left upper arm 2017, right lower leg 2018, bilateral lower leg and hip area 2019, inner right lower leg, outer right thigh and upper outside left arm 10/2018, osteoporosis, cervical intraepithelial neoplasia,. Per chart review  patient lives alone. Two-level home bed and bath upstairs. Reportedly independent driving prior to admission. Presented 08/09/2023 with altered mental status after being confused for 1 to 2 days. Patient had a reported fall x 2 and did strike her head on the floor. Cranial CT scan negative for acute intracranial abnormality. No acute displaced facial fracture. CT maxillofacial negative. CTA of head and neck showed occlusion of the right ICA just distal to the bifurcation, age-indeterminate, but presumably acute given patient's symptoms. Right ICA remained occluded to the terminus. Revascularization of the right MCA via collateral flow across the circle of Willis. No intervention was required for internal carotid artery occlusion. MRI showed patchy small volume acute ischemic nonhemorrhagic right PCA territory infarct with a few additional punctate infarcts within the right frontal lobe. Underlying mildly advanced cerebral atrophy. Admission chemistries unremarkable except WBC 16,700, sodium 134, potassium 3.4, glucose 309, total bilirubin 1.6, hemoglobin A1c 3.7, urinalysis negative and culture OB urine less than 10,000 insignificant growth nitrite. Echocardiogram with ejection fraction of 60 to 65% no wall motion abnormalities grade 1 diastolic dysfunction. Carotid Dopplers showed no significant bilateral extracranial stenosis. Neurology follow-up placed on aspirin  81 mg daily and Plavix 75 mg daily x 3 months then Plavix alone. Recommendations of 30-day heart monitor at discharge. Patient did complete a 3-day empiric course of IV Rocephin for acute cystitis/UTI. Hospital course 08/11/2023 episode of PSVT heart rate in 120s narrow complex tachycardia. Valsalva maneuver performed patient terminated within about 3 seconds. Beta-blocker was not started due to resting heart rate in the 50s and low 60s and monitored. Lovenox  was initiated for DVT prophylaxis. She is maintained on a regular consistency diet. Therapy  evaluations completed due to patient decreased functional mobility was admitted for a comprehensive rehab program.  Patient transferred to CIR on 08/13/2023 .   Patient currently requires supervision with mobility secondary to muscle weakness, decreased cardiorespiratoy endurance, decreased awareness and decreased problem solving, and decreased standing balance, hemiplegia, and decreased balance strategies.  Prior to hospitalization, patient was independent  with mobility and lived with Alone in a House home.  Home access is 4Stairs to enter.  Patient will benefit from skilled PT intervention to maximize safe functional mobility, minimize fall risk, and decrease caregiver burden for planned discharge home with intermittent assist.  Anticipate patient will benefit from follow up OP at discharge.  PT - End of Session Activity Tolerance: Tolerates 30+ min activity with multiple rests Endurance Deficit: Yes Endurance Deficit Description: reports lightheadedness with activity PT Assessment Rehab Potential (ACUTE/IP ONLY): Excellent PT Barriers to Discharge: Lack of/limited family support;Decreased caregiver support PT Patient demonstrates impairments in the following area(s): Balance;Endurance;Motor;Safety PT Transfers Functional Problem(s): Bed Mobility;Bed to Chair;Car PT Locomotion Functional Problem(s): Ambulation;Stairs PT Plan PT Intensity: Minimum of 1-2 x/day ,45 to 90 minutes PT Frequency: 5 out of 7 days PT Duration Estimated Length of Stay: 7-9 days PT Treatment/Interventions: Ambulation/gait training;Cognitive remediation/compensation;Balance/vestibular training;Community reintegration;Discharge planning;DME/adaptive equipment instruction;Functional mobility training;Psychosocial support;Therapeutic Activities;UE/LE Strength taining/ROM;Visual/perceptual remediation/compensation;Therapeutic Exercise;UE/LE Coordination activities;Stair training;Skin care/wound management;Patient/family  education;Neuromuscular re-education;Functional electrical stimulation;Disease management/prevention PT Transfers  Anticipated Outcome(s): mod I PT Locomotion Anticipated Outcome(s): mod I PT Recommendation Follow Up Recommendations: Outpatient PT;24 hour supervision/assistance Patient destination: Home Equipment Recommended: To be determined   PT Evaluation Precautions/Restrictions Precautions Precautions: Fall Restrictions Weight Bearing Restrictions Per Provider Order: No General   Vital Signs  Pain Pain Assessment Pain Scale: Faces Faces Pain Scale: No hurt Pain Type: Acute pain Pain Location: Teeth Pain Orientation: Left Pain Descriptors / Indicators: Aching Pain Onset: On-going Pain Intervention(s): Medication (See eMAR);Shower Multiple Pain Sites: No Pain Interference Pain Interference Pain Effect on Sleep: 1. Rarely or not at all Pain Interference with Therapy Activities: 0. Does not apply - I have not received rehabilitationtherapy in the past 5 days Pain Interference with Day-to-Day Activities: 1. Rarely or not at all Home Living/Prior Functioning Home Living Living Arrangements: Alone Available Help at Discharge: Family Type of Home: House Home Access: Stairs to enter [employer] of Steps: 4 Entrance Stairs-Rails: Right;Left Home Layout: Two level;Bed/bath upstairs Alternate Level Stairs-Number of Steps: flight Alternate Level Stairs-Rails: Left Bathroom Shower/Tub: Tub/shower unit;Door [employer] Toilet: Standard Bathroom Accessibility: Yes Additional Comments: reports she has a tub transfer bench  Lives With: Alone Prior Function Level of Independence: Independent with basic ADLs;Independent with gait;Independent with transfers  Able to Take Stairs?: Yes Driving: Yes Vocation: Retired Gaffer: Patent examiner - History Ability to See in Adequate Light: 0 Adequate Vision - Assessment Eye  Alignment: Within Functional Limits Ocular Range of Motion: Within Functional Limits Alignment/Gaze Preference: Within Defined Limits Tracking/Visual Pursuits: Able to track stimulus in all quads without difficulty Saccades: Within functional limits Convergence: Impaired - to be further tested in functional context Additional Comments: left eye open more than right eye Perception Perception: Within Functional Limits Praxis Praxis: WFL Praxis Impairment Details: Ideomotor;Motor planning  Cognition Overall Cognitive Status: Impaired/Different from baseline Arousal/Alertness: Awake/alert Orientation Level: Oriented X4 Year: 2025 Month: June Day of Week: Correct Attention: Sustained Sustained Attention: Appears intact Memory: Impaired Memory Impairment: Retrieval deficit;Storage deficit Awareness: Impaired Awareness Impairment: Emergent impairment Problem Solving: Impaired Problem Solving Impairment: Functional basic Executive Function: Sequencing;Organizing;Self Monitoring Sequencing: Impaired Sequencing Impairment: Functional basic;Functional complex Organizing: Impaired Organizing Impairment: Functional basic;Functional complex Self Monitoring: Impaired Self Monitoring Impairment: Functional basic;Functional complex Behaviors: Restless Safety/Judgment: Impaired Comments: eager to drive Sensation Sensation Light Touch: Appears Intact Hot/Cold: Appears Intact Proprioception: Impaired by gross assessment Stereognosis: Appears Intact Coordination Gross Motor Movements are Fluid and Coordinated: No Fine Motor Movements are Fluid and Coordinated: Yes 9 Hole Peg Test: right 33.77  left 32.49 Motor  Motor Motor: Hemiplegia;Motor apraxia Motor - Skilled Clinical Observations: very mild motor planning deficits on L side with mild L sided weakness   Trunk/Postural Assessment  Cervical Assessment Cervical Assessment: Within Functional Limits Thoracic Assessment Thoracic  Assessment: Within Functional Limits Lumbar Assessment Lumbar Assessment: Within Functional Limits Postural Control Postural Control: Within Functional Limits  Balance Balance Balance Assessed: Yes Static Sitting Balance Static Sitting - Balance Support: No upper extremity supported;Feet supported Static Sitting - Level of Assistance: 7: Independent Dynamic Sitting Balance Dynamic Sitting - Balance Support: No upper extremity supported;Feet supported;During functional activity Dynamic Sitting - Level of Assistance: 7: Independent Static Standing Balance Static Standing - Balance Support: Right upper extremity supported;Left upper extremity supported;During functional activity Static Standing - Level of Assistance: 5: Stand by assistance Dynamic Standing Balance Dynamic Standing - Balance Support: Right upper extremity supported;Left upper extremity supported;During functional activity Dynamic Standing - Level of Assistance: 5: Stand by assistance Extremity Assessment  RUE Assessment RUE Assessment: Within Functional Limits General Strength Comments: Grip strength 50lb LUE Assessment LUE Assessment: Within Functional Limits General Strength Comments: Grip strength 38lb RLE Assessment RLE Assessment: Within Functional Limits LLE Assessment LLE Assessment: Exceptions to [facility] General Strength Comments: Grossly 4+/5  Care Tool Care Tool Bed Mobility Roll left and right activity   Roll left and right assist level: Independent    Sit to lying activity   Sit to lying assist level: Independent    Lying to sitting on side of bed activity   Lying to sitting on side of bed assist level: the ability to move from lying on the back to sitting on the side of the bed with no back support.: Independent     Care Tool Transfers Sit to stand transfer   Sit to stand assist level: Contact Guard/Touching assist    Chair/bed transfer   Chair/bed transfer assist level: Contact Guard/Touching  assist    Car transfer   Car transfer assist level: Contact Guard/Touching assist      Care Tool Locomotion Ambulation   Assist level: Supervision/Verbal cueing Assistive device: No Device Max distance: 108ft  Walk 10 feet activity   Assist level: Supervision/Verbal cueing Assistive device: No Device   Walk 50 feet with 2 turns activity   Assist level: Supervision/Verbal cueing Assistive device: No Device  Walk 150 feet activity   Assist level: Supervision/Verbal cueing Assistive device: No Device  Walk 10 feet on uneven surfaces activity   Assist level: Contact Guard/Touching assist    Stairs   Assist level: Contact Guard/Touching assist Stairs assistive device: 1 hand rail Max number of stairs: 12  Walk up/down 1 step activity   Walk up/down 1 step (curb) assist level: Contact Guard/Touching assist Walk up/down 1 step or curb assistive device: 1 hand rail  Walk up/down 4 steps activity   Walk up/down 4 steps assist level: Contact Guard/Touching assist Walk up/down 4 steps assistive device: 1 hand rail  Walk up/down 12 steps activity   Walk up/down 12 steps assist level: Contact Guard/Touching assist Walk up/down 12 steps assistive device: 1 hand rail  Pick up small objects from floor   Pick up small object from the floor assist level: Contact Guard/Touching assist    Wheelchair Is the patient using a wheelchair?: No          Wheel 50 feet with 2 turns activity      Wheel 150 feet activity        Refer to Care Plan for Long Term Goals  SHORT TERM GOAL WEEK 1 PT Short Term Goal 1 (Week 1): STG = LTG due to ELOS  Recommendations for other services: None   Skilled Therapeutic Intervention Mobility Bed Mobility Bed Mobility: Rolling Right;Rolling Left;Supine to Sit;Sit to Supine Rolling Right: Independent Rolling Left: Independent Supine to Sit: Independent Sit to Supine: Independent Transfers Transfers: Sit to Stand;Stand to Sit;Stand Pivot  Transfers Sit to Stand: Contact Guard/Touching assist Stand to Sit: Contact Guard/Touching assist Stand Pivot Transfers: Contact Guard/Touching assist Transfer (Assistive device): None Locomotion  Gait Ambulation: Yes Gait Assistance: Contact Guard/Touching assist Gait Distance (Feet): 250 Feet Assistive device: None Gait Gait: Yes Gait Pattern: Impaired Gait Pattern: Step-through pattern;Decreased hip/knee flexion - left;Narrow base of support Stairs / Additional Locomotion Stairs: Yes Stairs Assistance: Contact Guard/Touching assist Stair Management Technique: Alternating pattern;Two rails;Forwards Number of Stairs: 12 Height of Stairs: 6 Wheelchair Mobility Wheelchair Mobility: No  Treatment: Pt sitting in recliner to start - in agreement  to therapy evaluation and has no reports of pain. Patient pleasant and cooperative during assessment, has some mild cognitive deficits but functionally moves around excellent. She in continent of bladder at start of session, completing toileting tasks without assist. Functional mobility training and assessment completed as above - needing CGA to supervision overall for functional mobility without the use of an AD. adminstered where she covered 1,082 feet without an AD in the 6 minutes - no standing or seated rest break needed. HR 79 and O2 100% post test. She completed Nustep for 12 minutes at L5 resistance using BUE/BLE, encouraging full AROM bilaterally and maintaining cadence of ~60spm. 0.4 miles, 720 steps, 1.9 METS. Pt returned to her room at end of treatment. Left with chair alarm on, seated in recliner, needs met.   Discharge Criteria: Patient will be discharged from PT if patient refuses treatment 3 consecutive times without medical reason, if treatment goals not met, if there is a change in medical status, if patient makes no progress towards goals or if patient is discharged from hospital.  The above assessment, treatment plan,  treatment alternatives and goals were discussed and mutually agreed upon: by patient  Pheobe Brass 08/14/2023, 1:57 PM

## 2023-08-14 NOTE — Plan of Care (Signed)
  Problem: RH Balance Goal: LTG Patient will maintain dynamic standing balance (PT) Description: LTG:  Patient will maintain dynamic standing balance with assistance during mobility activities (PT) Flowsheets (Taken 08/14/2023 1404) LTG: Pt will maintain dynamic standing balance during mobility activities with:: Independent with assistive device    Problem: Sit to Stand Goal: LTG:  Patient will perform sit to stand with assistance level (PT) Description: LTG:  Patient will perform sit to stand with assistance level (PT) Flowsheets (Taken 08/14/2023 1404) LTG: PT will perform sit to stand in preparation for functional mobility with assistance level: Independent with assistive device   Problem: RH Bed Mobility Goal: LTG Patient will perform bed mobility with assist (PT) Description: LTG: Patient will perform bed mobility with assistance, with/without cues (PT). Flowsheets (Taken 08/14/2023 1404) LTG: Pt will perform bed mobility with assistance level of: Independent with assistive device    Problem: RH Bed to Chair Transfers Goal: LTG Patient will perform bed/chair transfers w/assist (PT) Description: LTG: Patient will perform bed to chair transfers with assistance (PT). Flowsheets (Taken 08/14/2023 1404) LTG: Pt will perform Bed to Chair Transfers with assistance level: Independent with assistive device    Problem: RH Car Transfers Goal: LTG Patient will perform car transfers with assist (PT) Description: LTG: Patient will perform car transfers with assistance (PT). Flowsheets (Taken 08/14/2023 1404) LTG: Pt will perform car transfers with assist:: Independent with assistive device    Problem: RH Ambulation Goal: LTG Patient will ambulate in controlled environment (PT) Description: LTG: Patient will ambulate in a controlled environment, # of feet with assistance (PT). Flowsheets (Taken 08/14/2023 1404) LTG: Pt will ambulate in controlled environ  assist needed:: Independent with assistive  device LTG: Ambulation distance in controlled environment: 150' Goal: LTG Patient will ambulate in home environment (PT) Description: LTG: Patient will ambulate in home environment, # of feet with assistance (PT). Flowsheets (Taken 08/14/2023 1404) LTG: Pt will ambulate in home environ  assist needed:: Independent with assistive device LTG: Ambulation distance in home environment: 100'   Problem: RH Stairs Goal: LTG Patient will ambulate up and down stairs w/assist (PT) Description: LTG: Patient will ambulate up and down # of stairs with assistance (PT) Flowsheets (Taken 08/14/2023 1404) LTG: Pt will ambulate up/down stairs assist needed:: Independent with assistive device LTG: Pt will  ambulate up and down number of stairs: at least 12 steps

## 2023-08-14 NOTE — Progress Notes (Addendum)
 Patient ID: [id number] Alicia Wilkerson, female   DOB: 1950-04-10, 73 y.o.   MRN: [id number] Spoke with sister-[name] via telephone to inform team feels will be ready Tuesday-meeting goals-mod/I. She feels pt is worse in the afternoons and/or early evening. She feels she is worse at these times. Son can get here at earliest Wed. Sister voiced has 13 narrow steps she will need to work on. Can this be done discharge Wed.  2:17 PM Sister concerned that has follow up for therapies, counseling, seeing doctors instead of putting it off. Another son to be here over the weekend and will touch base with therapies and nursing.   3:08 PM Discharge will be Wed 6/25. Have called sister to let her know of this. The steep steps are going up to her bedroom.

## 2023-08-14 NOTE — Evaluation (Signed)
 Occupational Therapy Assessment and Plan  Patient Details  Name: Alicia Wilkerson MRN: [id number] Date of Birth: March 11, 1950  OT Diagnosis: acute pain, apraxia, cognitive deficits, disturbance of vision, hemiplegia affecting non-dominant side, and muscle weakness (generalized) Rehab Potential: Rehab Potential (ACUTE ONLY): Good ELOS: 3-5 days   Today's Date: 08/14/2023 OT Individual Time: 0847-1000 OT Individual Time Calculation (min): 73 min     Hospital Problem: Principal Problem:   Right middle cerebral artery stroke [facility])   Past Medical History:  Past Medical History:  Diagnosis Date   Anemia    Anxiety disorder    Basal cell carcinoma (BCC) of left upper arm 12/2015   Basal cell carcinoma (BCC) of right lower leg 04/2016   Borderline hypertension    CAD (coronary artery disease)    a. nonobst by cor CT 12/2018.   CIN II (cervical intraepithelial neoplasia II)    CIN  1   Depression    Eczema    Family history of malignant neoplasm of gastrointestinal tract 08/14/2020   Family history of premature CAD    History of basal cell carcinoma (BCC) excision    left upper arm 2017;   right lower leg 2018;  bilateral lower leg and hip area 2019;   inner right lower leg, outer right thigh, & upper outside left arm 09/ 2020   History of cervical dysplasia 2013   History of hereditary spherocytosis    s/p splenectomy in 1954   History of hyperparathyroidism    per pt yrs ago was put on mega dose of vit d which caused the hyperparathyroid resolved after stopping vit d   History of pelvic fracture 2013   Left inguinal hernia 01/02/2023   MAI (mycobacterium avium-intracellulare) (HCC)    ? possibly by CT 12/2018   Mild hyperlipidemia    Osteoporosis    PONV (postoperative nausea and vomiting)    S/P hernia repair 03/02/2023   S/P splenectomy    spherocytosis   Wears glasses    White coat syndrome without diagnosis of hypertension    Past Surgical History:  Past Surgical  History:  Procedure Laterality Date   ANKLE SURGERY  03/14/2015   BREAST BIOPSY Left 08/30/2021   CYSTIC APOCRINE METAPLASIA   CERVICAL CONIZATION W/BX N/A 12/13/2018   Procedure: CONIZATION CERVIX WITH BIOPSY;  Surgeon: Lillian Rein, MD;  Location: [facility] OR;  Service: Gynecology;  Laterality: N/A;   CYSTOSCOPY N/A 10/18/2019   Procedure: CYSTOSCOPY;  Surgeon: Lillian Rein, MD;  Location[facility];  Service: Gynecology;  Laterality: N/A;   IR RADIOLOGY PERIPHERAL GUIDED IV START  01/05/2019   IR US  GUIDE VASC ACCESS RIGHT  01/05/2019   KNEE SURGERY Left 1991   per pt retained hardward   LEEP N/A 12/13/2018   Procedure: possible LOOP ELECTROSURGICAL EXCISION PROCEDURE (LEEP);  Surgeon: Lillian Rein, MD;  Location: [facility] OR;  Service: Gynecology;  Laterality: N/A;   ORIF ANKLE FRACTURE Left 03/14/2015   per pt retained hardware   ORIF WRIST FRACTURE Left 03/10/2022   Procedure: OPEN REDUCTION INTERNAL FIXATION (ORIF) WRIST FRACTURE;  Surgeon: Arvil Birks, MD;  Location: [facility] OR;  Service: Orthopedics;  Laterality: Left;  regional with iv sedation   SPLENECTOMY, TOTAL  1954   due to spherocytosis   TOTAL LAPAROSCOPIC HYSTERECTOMY WITH SALPINGECTOMY N/A 10/18/2019   Procedure: TOTAL LAPAROSCOPIC HYSTERECTOMY WITH BILATERAL SALPINGECTOMY AND BILATERAL OOPHORECTOMY;  Surgeon: Lillian Rein, MD;  Location: [facility];  Service: Gynecology;  Laterality: N/A;  BILATERAL SALPINGECTOMY POSS OOPHORECTOMY   TUBAL LIGATION Bilateral 1995    Assessment & Plan Clinical Impression: Alicia Wilkerson is a 73 year old right handed female with history significant for essential hypertension, hyperlipidemia, anxiety/depression disorder, CAD maintained on low-dose aspirin , history of basal cell carcinoma left upper arm 2017, right lower leg 2018, bilateral lower leg and hip area 2019, inner right lower leg, outer right thigh and upper outside left arm 10/2018, osteoporosis,  cervical intraepithelial neoplasia,.  Per chart review patient lives alone.  Two-level home bed and bath upstairs.  Reportedly independent driving prior to admission.  Presented 08/09/2023 with altered mental status after being confused for 1 to 2 days.  Patient had a reported fall x 2 and did strike her head on the floor.  Cranial CT scan negative for acute intracranial abnormality.  No acute displaced facial fracture.  CT maxillofacial negative.  CTA of head and neck showed occlusion of the right ICA just distal to the bifurcation, age-indeterminate, but presumably acute given patient's symptoms.  Right ICA remained occluded to the terminus.  Revascularization of the right MCA via collateral flow across the circle of Willis.  No intervention was required for internal carotid artery occlusion.  MRI showed patchy small volume acute ischemic nonhemorrhagic right PCA territory infarct with a few additional punctate infarcts within the right frontal lobe.  Underlying mildly advanced cerebral atrophy.  Admission chemistries unremarkable except WBC 16,700, sodium 134, potassium 3.4, glucose 309, total bilirubin 1.6, hemoglobin A1c 3.7, urinalysis negative and culture OB urine less than 10,000 insignificant growth nitrite.  Echocardiogram with ejection fraction of 60 to 65% no wall motion abnormalities grade 1 diastolic dysfunction.  Carotid Dopplers showed no significant bilateral extracranial stenosis.  Neurology follow-up placed on aspirin  81 mg daily and Plavix 75 mg daily x 3 months then Plavix alone.  Recommendations of 30-day heart monitor at discharge.  Patient did complete a 3-day empiric course of IV Rocephin for acute cystitis/UTI.  Hospital course 08/11/2023 episode of PSVT heart rate in 120s narrow complex tachycardia.  Valsalva maneuver performed patient terminated within about 3 seconds.  Beta-blocker was not started due to resting heart rate in the 50s and low 60s and monitored.  Lovenox  was initiated for  DVT prophylaxis.  She is maintained on a regular consistency diet.  Therapy evaluations completed due to patient decreased functional mobility was admitted for a comprehensive rehab program.Patient transferred to CIR on 08/13/2023 .    Patient currently requires min with basic self-care skills secondary to decreased cardiorespiratoy endurance, motor apraxia, decreased coordination, and decreased motor planning, decreased motor planning, and decreased initiation, decreased attention, decreased awareness, decreased problem solving, decreased safety awareness, and decreased memory.  Prior to hospitalization, patient could complete BADL with independent .  Patient will benefit from skilled intervention to decrease level of assist with basic self-care skills, increase independence with basic self-care skills, and increase level of independence with iADL prior to discharge home with care partner.  Anticipate patient will require intermittent supervision and follow up outpatient.  OT - End of Session Activity Tolerance: Tolerates < 10 min activity with changes in vital signs Endurance Deficit: Yes Endurance Deficit Description: reports lightheadedness with activity OT Assessment Rehab Potential (ACUTE ONLY): Good OT Patient demonstrates impairments in the following area(s): Balance;Vision;Cognition;Safety;Endurance;Motor;Perception OT Basic ADL's Functional Problem(s): Bathing;Dressing;Toileting OT Advanced ADL's Functional Problem(s): Simple Meal Preparation OT Transfers Functional Problem(s): Toilet;Tub/Shower OT Additional Impairment(s): None OT Plan OT Intensity: Minimum of 1-2 x/day, 45 to 90 minutes  OT Duration/Estimated Length of Stay: 3-5 days OT Treatment/Interventions: Neuromuscular re-education;Patient/family education;Self Care/advanced ADL retraining;Therapeutic Exercise;UE/LE Coordination activities;Cognitive remediation/compensation;Discharge planning;DME/adaptive equipment  instruction;Functional mobility training;Therapeutic Activities;UE/LE Strength taining/ROM;Visual/perceptual remediation/compensation;Balance/vestibular training OT Self Feeding Anticipated Outcome(s): independent OT Basic Self-Care Anticipated Outcome(s): independent OT Toileting Anticipated Outcome(s): independent OT Bathroom Transfers Anticipated Outcome(s): modified independent OT Recommendation Recommendations for Other Services: Therapeutic Recreation consult Therapeutic Recreation Interventions: Kitchen group;Stress management Patient destination: Home Follow Up Recommendations: Outpatient OT Equipment Recommended: To be determined   OT Evaluation Precautions/Restrictions  Precautions Precautions: Fall Restrictions Weight Bearing Restrictions Per Provider Order: No   Vital Signs Therapy Vitals Pulse Rate: 81 BP: 121/64 Patient Position (if appropriate): Sitting Oxygen Therapy SpO2: 98 % O2 Device: Room Air Pain Pain Assessment Pain Scale: Faces Faces Pain Scale: No hurt Pain Type: Acute pain Pain Location: Teeth Pain Orientation: Left Pain Descriptors / Indicators: Aching Pain Onset: On-going Pain Intervention(s): Medication (See eMAR);Shower Multiple Pain Sites: No Home Living/Prior Functioning Home Living Living Arrangements: Alone Available Help at Discharge: Family Type of Home: House Home Access: Stairs to enter Secretary/administrator of Steps: 4 Entrance Stairs-Rails: Right, Left Home Layout: Two level, Bed/bath upstairs Alternate Level Stairs-Number of Steps: flight Alternate Level Stairs-Rails: Left Bathroom Shower/Tub: Tub/shower unit, Door [employer] Toilet: Standard Bathroom Accessibility: Yes Additional Comments: reports she has a tub transfer bench  Lives With: Alone IADL History Homemaking Responsibilities: Yes Meal Prep Responsibility: Primary Laundry Responsibility: Primary Cleaning Responsibility: Primary Bill Paying/Finance  Responsibility: Primary Shopping Responsibility: Primary Child Care Responsibility: No Current License: Yes Mode of Transportation: Car Occupation: Retired Type of Occupation: Field seismologist Leisure and Hobbies: [employer] - gardening, lunches, movies Prior Function Level of Independence: Independent with basic ADLs, Independent with gait, Independent with transfers  Able to Take Stairs?: Yes Driving: Yes Vocation: Retired Gaffer: Investment banker, operational Baseline Vision/History: 1 Wears glasses Ability to See in Adequate Light: 0 Adequate Patient Visual Report: No change from baseline Vision Assessment?: Yes Eye Alignment: Within Functional Limits Ocular Range of Motion: Within Functional Limits Alignment/Gaze Preference: Within Defined Limits Tracking/Visual Pursuits: Able to track stimulus in all quads without difficulty Saccades: Within functional limits Convergence: Impaired - to be further tested in functional context Visual Fields: No apparent deficits Additional Comments: left eye open more than right eye Perception  Perception: Within Functional Limits Praxis Praxis: WFL Praxis Impairment Details: Ideomotor;Motor planning Cognition Cognition Overall Cognitive Status: Impaired/Different from baseline Arousal/Alertness: Awake/alert Orientation Level: Person;Place;Situation Person: Oriented Place: Oriented Situation: Oriented Memory: Impaired Memory Impairment: Retrieval deficit;Storage deficit Attention: Sustained Sustained Attention: Appears intact Awareness: Impaired Awareness Impairment: Emergent impairment Problem Solving: Impaired Problem Solving Impairment: Functional basic Executive Function: Sequencing;Organizing;Self Monitoring Sequencing: Impaired Sequencing Impairment: Functional basic;Functional complex Organizing: Impaired Organizing Impairment: Functional basic;Functional complex Self Monitoring:  Impaired Self Monitoring Impairment: Functional basic;Functional complex Behaviors: Restless Safety/Judgment: Impaired Comments: eager to drive Brief Interview for Mental Status (BIMS) Repetition of Three Words (First Attempt): 3 Temporal Orientation: Year: Correct Temporal Orientation: Month: Accurate within 5 days Temporal Orientation: Day: Correct Recall: Sock: Yes, no cue required Recall: Blue: Yes, no cue required Recall: Bed: Yes, no cue required BIMS Summary Score: 15 Sensation Sensation Light Touch: Appears Intact Hot/Cold: Appears Intact Proprioception: Impaired by gross assessment Stereognosis: Appears Intact Coordination Gross Motor Movements are Fluid and Coordinated: No Fine Motor Movements are Fluid and Coordinated: Yes 9 Hole Peg Test: right 33.77  left 32.49 Motor  Motor Motor: Hemiplegia;Motor apraxia Motor - Skilled Clinical Observations: Mild motor planning deficits LUE  Trunk/Postural  Assessment  Cervical Assessment Cervical Assessment: Within Functional Limits Thoracic Assessment Thoracic Assessment: Within Functional Limits Lumbar Assessment Lumbar Assessment: Within Functional Limits Postural Control Postural Control: Within Functional Limits  Balance Balance Balance Assessed: Yes Static Sitting Balance Static Sitting - Balance Support: No upper extremity supported;Feet supported Static Sitting - Level of Assistance: 7: Independent Dynamic Sitting Balance Dynamic Sitting - Balance Support: No upper extremity supported;Feet supported;During functional activity Dynamic Sitting - Level of Assistance: 7: Independent Static Standing Balance Static Standing - Balance Support: Right upper extremity supported;Left upper extremity supported;During functional activity Static Standing - Level of Assistance: 5: Stand by assistance Dynamic Standing Balance Dynamic Standing - Balance Support: Right upper extremity supported;Left upper extremity  supported;During functional activity Dynamic Standing - Level of Assistance: 5: Stand by assistance Extremity/Trunk Assessment RUE Assessment RUE Assessment: Within Functional Limits General Strength Comments: Grip strength 50lb LUE Assessment LUE Assessment: Within Functional Limits General Strength Comments: Grip strength 38lb  Care Tool Care Tool Self Care Eating    Refused    Oral Care    Oral Care Assist Level: Set up assist    Bathing   Body parts bathed by patient: Right arm;Left arm;Chest;Abdomen;Front perineal area;Buttocks;Face;Left lower leg;Right lower leg;Left upper leg;Right upper leg     Assist Level: Supervision/Verbal cueing    Upper Body Dressing(including orthotics)   What is the patient wearing?: Pull over shirt   Assist Level: Set up assist    Lower Body Dressing (excluding footwear)   What is the patient wearing?: Underwear/pull up;Pants Assist for lower body dressing: Set up assist    Putting on/Taking off footwear   What is the patient wearing?: Socks Assist for footwear: Set up assist       Care Tool Toileting Toileting activity   Assist for toileting: Contact Guard/Touching assist     Care Tool Bed Mobility Roll left and right activity   Roll left and right assist level: Independent    Sit to lying activity   Sit to lying assist level: Independent    Lying to sitting on side of bed activity   Lying to sitting on side of bed assist level: the ability to move from lying on the back to sitting on the side of the bed with no back support.: Independent     Care Tool Transfers Sit to stand transfer   Sit to stand assist level: Contact Guard/Touching assist    Chair/bed transfer   Chair/bed transfer assist level: Contact Guard/Touching assist     Toilet transfer   Assist Level: Contact Guard/Touching assist     Care Tool Cognition  Expression of Ideas and Wants Expression of Ideas and Wants: 4. Without difficulty (complex and  basic) - expresses complex messages without difficulty and with speech that is clear and easy to understand  Understanding Verbal and Non-Verbal Content Understanding Verbal and Non-Verbal Content: 4. Understands (complex and basic) - clear comprehension without cues or repetitions   Memory/Recall Ability Memory/Recall Ability : Current season;Location of own room;Staff names and faces;That he or she is in a hospital/hospital unit   Refer to Care Plan for Long Term Goals  SHORT TERM GOAL WEEK 1 OT Short Term Goal 1 (Week 1): Patient will gather needed BADL items prior to bathing and dressing task without prompting OT Short Term Goal 2 (Week 1): STG=LTG due to LOS  Recommendations for other services: None    Skilled Therapeutic Intervention:  Patient received supine in bed awake and eager for therapy session.  Patient is verbal regarding wanting to get home as soon as possible, and reports wanting to attend a family event out of town on July 4th.  Patient eager to shower, but needs cueing and assistance to organize needed clothing items from bags that family prepared.  Patient needed increased time and cueing to remove items - socks, glasses, hearing aides before getting into shower.  Patient moving well, although drifts toward right with walking - mild.  Patient needs overt cueing to locate room after walking to gym.   Patient left up in recliner visiting with SW at end of session.  Chair pad alarm in place and engaged, and call bell, phones in reach.   ADL ADL Eating: Not assessed Grooming: Setup Where Assessed-Grooming: Standing at sink Upper Body Bathing: Modified independent Where Assessed-Upper Body Bathing: Shower Lower Body Bathing: Supervision/safety Where Assessed-Lower Body Bathing: Shower Upper Body Dressing: Setup Lower Body Dressing: Setup Toileting: Contact guard Where Assessed-Toileting: Teacher, adult education: Furniture conservator/restorer Method: Music therapist: Close supervison Web designer Method: Ship broker: Insurance underwriter: Administrator, arts Method: Designer, industrial/product: Event organiser  Bed Mobility Bed Mobility: Rolling Right;Rolling Left;Supine to Sit;Sit to Supine Rolling Right: Independent Rolling Left: Independent Supine to Sit: Independent Sit to Supine: Independent Transfers Sit to Stand: Contact Guard/Touching assist Stand to Sit: Contact Guard/Touching assist   Discharge Criteria: Patient will be discharged from OT if patient refuses treatment 3 consecutive times without medical reason, if treatment goals not met, if there is a change in medical status, if patient makes no progress towards goals or if patient is discharged from hospital.  The above assessment, treatment plan, treatment alternatives and goals were discussed and mutually agreed upon: by patient  Ashlynn Gunnels M 08/14/2023, 1:15 PM

## 2023-08-14 NOTE — Discharge Summary (Signed)
 Physician Discharge Summary  Patient ID: Alicia Wilkerson MRN: [id number] DOB/AGE: 1950-06-17 73 y.o.  Admit date: 08/13/2023 Discharge date: 08/19/2023  Discharge Diagnoses:  Principal Problem:   Right middle cerebral artery stroke [facility]) DVT prophylaxis Hyperlipidemia UTI/sepsis Episode of PSVT Hyperglycemia CAD Mood stabilization Basal cell carcinoma  Discharged Condition: Stable  Significant Diagnostic Studies: VAS US  CAROTID Result Date: 08/11/2023 Carotid Arterial Duplex Study Patient Name:  Alicia Wilkerson  Date of Exam:   08/11/2023 Medical Rec #: [id number]        Accession #:    [id number] Date of Birth: [birth year]        Patient Gender: F Patient Age:   73 years Exam Location:  [facility] Procedure:      VAS US  CAROTID Referring Phys: ELLOUISE MARI --------------------------------------------------------------------------------  Risk Factors: Hypertension, hyperlipidemia, coronary artery disease. Performing Technologist: Ezzie Potters RVT, RDMS  Examination Guidelines: A complete evaluation includes B-mode imaging, spectral Doppler, color Doppler, and power Doppler as needed of all accessible portions of each vessel. Bilateral testing is considered an integral part of a complete examination. Limited examinations for reoccurring indications may be performed as noted.  Right Carotid Findings: +----------+--------+--------+--------+------------------+--------+           PSV cm/sEDV cm/sStenosisPlaque DescriptionComments +----------+--------+--------+--------+------------------+--------+ CCA Prox  71      0                                          +----------+--------+--------+--------+------------------+--------+ CCA Distal51      0                                          +----------+--------+--------+--------+------------------+--------+ ICA Prox                  Occluded                            +----------+--------+--------+--------+------------------+--------+ ICA Distal                Occluded                           +----------+--------+--------+--------+------------------+--------+ ECA       144     3                                          +----------+--------+--------+--------+------------------+--------+ +----------+--------+-------+----------------+-------------------+           PSV cm/sEDV cmsDescribe        Arm Pressure (mmHG) +----------+--------+-------+----------------+-------------------+ [id number]            Multiphasic, WNL                    +----------+--------+-------+----------------+-------------------+ +---------+--------+--+--------+--+---------+ VertebralPSV cm/s58EDV cm/s15Antegrade +---------+--------+--+--------+--+---------+  Left Carotid Findings: +----------+--------+--------+--------+------------------+------------------+           PSV cm/sEDV cm/sStenosisPlaque DescriptionComments           +----------+--------+--------+--------+------------------+------------------+ CCA Prox  114     23  intimal thickening +----------+--------+--------+--------+------------------+------------------+ CCA Distal78      16                                intimal thickening +----------+--------+--------+--------+------------------+------------------+ ICA Prox  85      19              focal and calcific                   +----------+--------+--------+--------+------------------+------------------+ ICA Distal77      23                                                   +----------+--------+--------+--------+------------------+------------------+ ECA       92      0                                                    +----------+--------+--------+--------+------------------+------------------+ +----------+--------+--------+----------------+-------------------+           PSV cm/sEDV  cm/sDescribe        Arm Pressure (mmHG) +----------+--------+--------+----------------+-------------------+ [id number]             Multiphasic, WNL                    +----------+--------+--------+----------------+-------------------+ +---------+--------+--+--------+-+---------+ VertebralPSV cm/s62EDV cm/s6Antegrade +---------+--------+--+--------+-+---------+   Summary: Right Carotid: Evidence consistent with a total occlusion of the right ICA. Left Carotid: The extracranial vessels were near-normal with only minimal wall               thickening or plaque. Vertebrals:  Bilateral vertebral arteries demonstrate antegrade flow. Subclavians: Normal flow hemodynamics were seen in bilateral subclavian              arteries. *See table(s) above for measurements and observations.  Electronically signed by Debby Robertson on 08/11/2023 at 4:32:35 PM.    Final    ECHOCARDIOGRAM COMPLETE Result Date: 08/11/2023    ECHOCARDIOGRAM REPORT   Patient Name:   Alicia Wilkerson Date of Exam: 08/11/2023 Medical Rec #:  [id number]       Height:       63.0 in Accession #:    [id number]      Weight:       123.7 lb Date of Birth:  Feb 02, 1951       BSA:          1.576 m Patient Age:    73 years        BP:           129/79 mmHg Patient Gender: F               HR:           60 bpm. Exam Location:  Inpatient Procedure: 2D Echo, Cardiac Doppler and Color Doppler (Both Spectral and Color            Flow Doppler were utilized during procedure). Indications:    CVA  History:        Patient has prior history of Echocardiogram examinations, most                 recent 11/14/2018.  Sonographer:  Therisa Crouch Referring Phys: [id number] CORTNEY E DE LA TORRE IMPRESSIONS  1. Left ventricular ejection fraction, by estimation, is 60 to 65%. The left ventricle has normal function. The left ventricle has no regional wall motion abnormalities. There is mild left ventricular hypertrophy. Left ventricular diastolic parameters are consistent  with Grade I diastolic dysfunction (impaired relaxation). Consider evaluation for infiltrative cardiomyopathy. (I.E HCM, amyloid)  2. Right ventricular systolic function is normal. The right ventricular size is normal. Mildly increased right ventricular wall thickness.  3. Left atrial size was moderately dilated.  4. Small to moderate sized pericardial effusion. No signs of tamponade.  5. The mitral valve is normal in structure. No evidence of mitral valve regurgitation. No evidence of mitral stenosis.  6. The aortic valve is normal in structure. Aortic valve regurgitation is not visualized. No aortic stenosis is present.  7. The inferior vena cava is normal in size with greater than 50% respiratory variability, suggesting right atrial pressure of 3 mmHg. FINDINGS  Left Ventricle: Left ventricular ejection fraction, by estimation, is 60 to 65%. The left ventricle has normal function. The left ventricle has no regional wall motion abnormalities. The left ventricular internal cavity size was normal in size. There is  mild left ventricular hypertrophy. Left ventricular diastolic parameters are consistent with Grade I diastolic dysfunction (impaired relaxation). Right Ventricle: The right ventricular size is normal. Mildly increased right ventricular wall thickness. Right ventricular systolic function is normal. Left Atrium: Left atrial size was moderately dilated. Right Atrium: Right atrial size was normal in size. Pericardium: A moderately sized pericardial effusion is present. Mitral Valve: The mitral valve is normal in structure. No evidence of mitral valve regurgitation. No evidence of mitral valve stenosis. Tricuspid Valve: The tricuspid valve is normal in structure. Tricuspid valve regurgitation is not demonstrated. No evidence of tricuspid stenosis. Aortic Valve: The aortic valve is normal in structure. There is mild aortic valve annular calcification. Aortic valve regurgitation is not visualized. No aortic  stenosis is present. Pulmonic Valve: The pulmonic valve was normal in structure. Pulmonic valve regurgitation is not visualized. No evidence of pulmonic stenosis. Aorta: The aortic root is normal in size and structure. Venous: The inferior vena cava is normal in size with greater than 50% respiratory variability, suggesting right atrial pressure of 3 mmHg. IAS/Shunts: No atrial level shunt detected by color flow Doppler.  LEFT VENTRICLE PLAX 2D LVIDd:         3.00 cm     Diastology LVIDs:         1.70 cm     LV e' medial:    4.35 cm/s LV PW:         1.10 cm     LV E/e' medial:  16.3 LV IVS:        1.30 cm     LV e' lateral:   5.55 cm/s LVOT diam:     1.90 cm     LV E/e' lateral: 12.8 LVOT Area:     2.84 cm  LV Volumes (MOD) LV vol d, MOD A2C: 38.2 ml LV vol d, MOD A4C: 37.5 ml LV vol s, MOD A2C: 13.2 ml LV vol s, MOD A4C: 12.0 ml LV SV MOD A2C:     25.0 ml LV SV MOD A4C:     37.5 ml LV SV MOD BP:      25.0 ml RIGHT VENTRICLE             IVC RV Basal diam:  2.80 cm  IVC diam: 0.90 cm RV S prime:     25.60 cm/s TAPSE (M-mode): 1.4 cm LEFT ATRIUM           Index LA diam:      3.90 cm 2.47 cm/m LA Vol (A4C): 65.8 ml 41.74 ml/m   AORTA Ao Root diam: 3.00 cm Ao Asc diam:  3.20 cm MITRAL VALVE               TRICUSPID VALVE MV Area (PHT): 2.90 cm    TR Peak grad:   19.5 mmHg MV Decel Time: 262 msec    TR Vmax:        221.00 cm/s MV E velocity: 71.10 cm/s MV A velocity: 71.80 cm/s  SHUNTS MV E/A ratio:  0.99        Systemic Diam: 1.90 cm Aditya Sabharwal Electronically signed by Ria Commander Signature Date/Time: 08/11/2023/9:19:13 AM    Final    CT HEAD WO CONTRAST ( ) Result Date: 08/10/2023 CLINICAL DATA:  Headache. EXAM: CT HEAD WITHOUT CONTRAST TECHNIQUE: Contiguous axial images were obtained from the base of the skull through the vertex without intravenous contrast. RADIATION DOSE REDUCTION: This exam was performed according to the departmental dose-optimization program which includes automated exposure  control, adjustment of the mA and/or kV according to patient size and/or use of iterative reconstruction technique. COMPARISON:  August 09, 2023, MR head dated August 10, 2023 FINDINGS: Brain: There is generalized cerebral atrophy with widening of the extra-axial spaces and ventricular dilatation. There are areas of decreased attenuation within the white matter tracts of the supratentorial brain, consistent with microvascular disease changes. The patchy small volume acute right PCA territory infarct seen on the earlier MR head (August 10, 2023) is not clearly visualized on the current plain brain CT. Vascular: Marked severity bilateral cavernous carotid artery calcification is noted. Skull: Normal. Negative for fracture or focal lesion. Sinuses/Orbits: There is mild left ethmoid sinus and mild sphenoid sinus mucosal thickening. Other: None. IMPRESSION: 1. Generalized cerebral atrophy with widening of the extra-axial spaces and ventricular dilatation. 2. The patchy small volume acute right PCA territory infarct seen on the earlier MR head is not clearly visualized on the current plain brain CT. 3. Mild left ethmoid sinus and mild sphenoid sinus mucosal thickening. Electronically Signed   By: Suzen Dials M.D.   On: 08/10/2023 18:28   MR BRAIN WO CONTRAST Result Date: 08/10/2023 CLINICAL DATA:  Initial evaluation for acute neuro deficit, stroke suspected. EXAM: MRI HEAD WITHOUT CONTRAST TECHNIQUE: Multiplanar, multiecho pulse sequences of the brain and surrounding structures were obtained without intravenous contrast. COMPARISON:  CTs from 08/09/2023 FINDINGS: Brain: Mildly advanced cerebral atrophy. No significant cerebral white matter disease for age. 1.2 cm acute ischemic nonhemorrhagic infarct present at the ventral right thalamic capsular region (series 5, image 75). Patchy small volume acute ischemic nonhemorrhagic infarcts at the right occipital lobe, right PCA distribution (series 5, images 68, 70, 73). Mild  patchy involvement of the mesial right temporal lobe/right hippocampus (series 5, images 72, 69). Few additional punctate acute ischemic nonhemorrhagic infarcts about the right frontal lobe (series 5, images 83, 80). No other evidence for acute or subacute infarct. Gray-white matter differentiation otherwise maintained. No acute or chronic intracranial blood products. No mass lesion, midline shift or mass effect. No hydrocephalus or extra-axial fluid collection. Prominent dural calcifications noted along the anterior falx. Pituitary gland within normal limits. Vascular: Major intracranial vascular flow voids are maintained. Skull and upper cervical spine: Craniocervical junction within normal limits.  Bone marrow signal intensity normal. No scalp soft tissue abnormality. Sinuses/Orbits: Globes orbital soft tissues within normal limits. Small right maxillary sinus retention cyst noted. Small volume pneumatized secretions noted within the left sphenoid sinus. No significant mastoid effusion. Other: None. IMPRESSION: 1. Patchy small volume acute ischemic nonhemorrhagic right PCA territory infarcts as above, with a few additional punctate infarcts within the right frontal lobe. 2. Underlying mildly advanced cerebral atrophy. Electronically Signed   By: Morene Hoard M.D.   On: 08/10/2023 02:39   CT ANGIO HEAD NECK W WO CM Result Date: 08/09/2023 CLINICAL DATA:  Initial evaluation for acute diplopia, Horner syndrome. EXAM: CT ANGIOGRAPHY HEAD AND NECK WITH AND WITHOUT CONTRAST TECHNIQUE: Multidetector CT imaging of the head and neck was performed using the standard protocol during bolus administration of intravenous contrast. Multiplanar CT image reconstructions and MIPs were obtained to evaluate the vascular anatomy. Carotid stenosis measurements (when applicable) are obtained utilizing NASCET criteria, using the distal internal carotid diameter as the denominator. RADIATION DOSE REDUCTION: This exam was  performed according to the departmental dose-optimization program which includes automated exposure control, adjustment of the mA and/or kV according to patient size and/or use of iterative reconstruction technique. CONTRAST:  75mL OMNIPAQUE  IOHEXOL  350 MG/ML SOLN COMPARISON:  CT from earlier the same day. FINDINGS: CTA NECK FINDINGS Aortic arch: Visualized aortic arch within normal limits for caliber. Origin of the great vessels incompletely visualized on this exam. Mild aortic atherosclerosis. Right carotid system: Right CCA tortuous but patent without stenosis. Atheromatous change about the right carotid bulb. There is occlusion of the right ICA just distal to the bifurcation (series 7, image 110). Right ICA remains occluded to the terminus. Left carotid system: Left common and internal carotid arteries are tortuous but patent without dissection. Mild atheromatous change about the left carotid bulb without hemodynamically significant stenosis. Vertebral arteries: Left vertebral artery hypoplastic and likely arises from the aortic arch, although the origin is not visualized. Atheromatous change at the origin of the dominant right vertebral artery with mild stenosis. Visualized vertebral arteries patent without stenosis or dissection. Skeleton: No worrisome osseous lesions. Moderate multilevel cervical spondylosis, most pronounced at C5-6 and C6-7. Other neck: No other acute finding. Upper chest: Small area of reticulonodular tree-in-bud densities within the left upper lobe, likely mild infection/small airways disease (series 7, image 138). No other acute finding. Review of the MIP images confirms the above findings CTA HEAD FINDINGS Anterior circulation: For atheromatous change about the left carotid siphon without hemodynamically significant stenosis. Right ICA remains occluded to the terminus. A1 segments patent bilaterally. Left A1 dominant. Normal anterior communicating artery complex. Anterior cerebral  arteries patent without stenosis. Left M1 segment and distal left MCA branches are widely patent and well perfused. Revascularization of the right MCA via collateral flow across the circle-of-Willis. Right MCA patent and perfused. Posterior circulation: Dominant right V4 segment patent without stenosis. Right PICA patent. Hypoplastic left vertebral artery largely terminates in PICA, although a tiny branch ascending towards the vertebrobasilar junction. Left PICA patent as well. Basilar patent without stenosis. Superior cerebral arteries patent bilaterally. Left PCA supplied via a hypoplastic left P1 segment and prominent left posterior communicating artery. Left PCA patent to its distal aspect without significant stenosis. Fetal type origin of the right PCA with severe stenosis versus occlusion at the origin of the right PCOM (series 8, image 107). Right PCA attenuated but patent distally without visible stenosis. Venous sinuses: Patent allowing for timing the contrast bolus. Anatomic variants: As above.  No aneurysm. Review of the MIP images confirms the above findings IMPRESSION: 1. Occlusion of the right ICA just distal to the bifurcation, age indeterminate, but presumably acute given patient's symptoms. Right ICA remains occluded to the terminus. Revascularization of the right MCA via collateral flow across the circle-of-Willis. 2. Fetal type origin of the right PCA with severe stenosis versus occlusion at the origin of the right PCOM. Right PCA attenuated but patent distally. 3. Mild atheromatous change about the left carotid bulb and left carotid siphon without hemodynamically significant stenosis. 4. Small area of reticulonodular tree-in-bud densities within the left upper lobe, likely mild infection/small airways disease. 5.  Aortic Atherosclerosis (ICD10-I70.0). Critical Value/emergent results were called by telephone at the time of interpretation on 08/09/2023 at 10:40 pm to provider DAN FLOYD , who  verbally acknowledged these results. Electronically Signed   By: Morene Hoard M.D.   On: 08/09/2023 22:43   DG Chest 2 View Result Date: 08/09/2023 EXAM: 2 VIEW(S) XRAY OF THE CHEST 08/09/2023 09:22:00 PM COMPARISON: 02/23/2022 CLINICAL HISTORY: r/o mass. AMS FINDINGS: LUNGS AND PLEURA: Mild lingular and right middle lobe scarring, chronic. Calcified granuloma in the left lower lung. No pulmonary edema. No pleural effusion. No pneumothorax. HEART AND MEDIASTINUM: No acute abnormality of the cardiac and mediastinal silhouettes. Thoracic aortic atherosclerosis. BONES AND SOFT TISSUES: No acute osseous abnormality. IMPRESSION: 1. No acute findings. Electronically signed by: Pinkie Pebbles MD 08/09/2023 09:28 PM EDT RP Workstation: [id number]   CT HEAD WO CONTRAST Result Date: 08/09/2023 CLINICAL DATA:  Neuro deficit, acute, stroke suspected; Facial trauma, blunt EXAM: CT HEAD WITHOUT CONTRAST CT MAXILLOFACIAL WITHOUT CONTRAST TECHNIQUE: Multidetector CT imaging of the head and maxillofacial structures were performed using the standard protocol without intravenous contrast. Multiplanar CT image reconstructions of the maxillofacial structures were also generated. RADIATION DOSE REDUCTION: This exam was performed according to the departmental dose-optimization program which includes automated exposure control, adjustment of the mA and/or kV according to patient size and/or use of iterative reconstruction technique. COMPARISON:  None Available. FINDINGS: CT HEAD FINDINGS Brain: Atherosclerotic calcifications are present within the cavernous internal carotid arteries. No evidence of large-territorial acute infarction. No parenchymal hemorrhage. No mass lesion. No extra-axial collection. No mass effect or midline shift. No hydrocephalus. Basilar cisterns are patent. Vascular: No hyperdense vessel. Atherosclerotic calcifications are present within the cavernous internal carotid arteries. Skull: No acute  fracture or focal lesion. Other: None. CT MAXILLOFACIAL FINDINGS Osseous: No fracture or mandibular dislocation. No destructive process. Sinuses/Orbits: Right maxillary sinus mucosal thickening. Paranasal sinuses and mastoid air cells are clear. The orbits are unremarkable. Soft tissues: Negative. Visualized upper cervical spine: Degenerative changes. IMPRESSION: 1. No acute intracranial abnormality. 2.  No acute displaced facial fracture. Electronically Signed   By: Morgane  Naveau M.D.   On: 08/09/2023 20:29   CT Maxillofacial Wo Contrast Result Date: 08/09/2023 CLINICAL DATA:  Neuro deficit, acute, stroke suspected; Facial trauma, blunt EXAM: CT HEAD WITHOUT CONTRAST CT MAXILLOFACIAL WITHOUT CONTRAST TECHNIQUE: Multidetector CT imaging of the head and maxillofacial structures were performed using the standard protocol without intravenous contrast. Multiplanar CT image reconstructions of the maxillofacial structures were also generated. RADIATION DOSE REDUCTION: This exam was performed according to the departmental dose-optimization program which includes automated exposure control, adjustment of the mA and/or kV according to patient size and/or use of iterative reconstruction technique. COMPARISON:  None Available. FINDINGS: CT HEAD FINDINGS Brain: Atherosclerotic calcifications are present within the cavernous internal carotid arteries. No evidence of large-territorial acute infarction. No parenchymal hemorrhage.  No mass lesion. No extra-axial collection. No mass effect or midline shift. No hydrocephalus. Basilar cisterns are patent. Vascular: No hyperdense vessel. Atherosclerotic calcifications are present within the cavernous internal carotid arteries. Skull: No acute fracture or focal lesion. Other: None. CT MAXILLOFACIAL FINDINGS Osseous: No fracture or mandibular dislocation. No destructive process. Sinuses/Orbits: Right maxillary sinus mucosal thickening. Paranasal sinuses and mastoid air cells are  clear. The orbits are unremarkable. Soft tissues: Negative. Visualized upper cervical spine: Degenerative changes. IMPRESSION: 1. No acute intracranial abnormality. 2.  No acute displaced facial fracture. Electronically Signed   By: Morgane  Naveau M.D.   On: 08/09/2023 20:29    Labs:  Basic Metabolic Panel: Recent Labs  Lab 08/12/23 0507 08/14/23 0632  NA 137 138  K 4.3 4.3  CL 105 105  CO2 26 26  GLUCOSE 102* 82  BUN 9 12  CREATININE 0.64 0.67  CALCIUM  9.0 8.8*  MG 2.1  --     CBC: Recent Labs  Lab 08/12/23 0507 08/13/23 0450 08/14/23 0632  WBC 10.6* 10.6* 8.8  NEUTROABS  --   --  5.4  HGB 12.4 11.8* 11.7*  HCT 35.6* 33.7* 33.7*  MCV 98.9 99.7 100.6*  PLT 322 301 306    CBG: No results for input(s): GLUCAP in the last 168 hours.  Family history.  Mother with osteoporosis and colon cancer father with heart failure.  Denies any breast cancer or rectal cancer  Brief HPI:   Alicia Wilkerson is a 73 y.o. right-handed female with history significant for hypertension hyperlipidemia anxiety/depression, CAD, history of basal cell carcinoma left upper arm right lower leg bilateral lower leg and hip area 2019, osteoporosis.  Per chart review patient lives alone in two-level home bed and bath upstairs.  Independent prior to admission.  Presented 08/09/2023 with altered mental status after being confused for 1 to 2 days.  Patient had a reported fall x 2 and did strike her head on the floor.  Cranial CT scan negative for acute changes.  No acute displaced facial fracture.  CTA of head and neck showed occlusion of the right ICA just distal to the bifurcation, age-indeterminate, but presumably acute given patient's symptoms.  Right ICA remained occluded to the terminus.  Revascularization of the right MCA via collateral flow across the circle of Willis.  No intervention was required for internal carotid artery occlusion.  MRI showed patchy small volume acute ischemic nonhemorrhagic right PCA  territory infarct with a few additional punctate infarcts within the right frontal lobe.  Underlying mildly advanced cerebral atrophy.  Admission chemistries unremarkable except WBC 16,700 potassium 3.4 total bilirubin 1.6 hemoglobin A1c 3.7 urinalysis negative and culture OB urine less than 10,000 insignificant growth.  Echocardiogram with ejection fraction of 60 to 65% no wall motion abnormalities grade 1 diastolic dysfunction.  Carotid Doppler showed no significant bilateral extracranial stenosis.  Neurology follow-up placed on low-dose aspirin  and Plavix  x 3 months then Plavix  alone.  Patient did complete a 3-day empiric course of IV Rocephin  for acute cystitis/UTI.  Hospital course 08/11/2023 episode of PSVT heart rate in the 120s narrow complex tachycardia.  Valsalva maneuver performed patient terminated within about 3 seconds.  Beta-blocker was not started due to resting heart rate in the 50s and low 60s.  Lovenox  added for DVT prophylaxis.  Therapy evaluations completed due to patient's decreased functional mobility was admitted for a comprehensive rehab program.   Hospital Course: Alicia Wilkerson was admitted to rehab 08/13/2023 for inpatient therapies to consist  of PT, ST and OT at least three hours five days a week. Past admission physiatrist, therapy team and rehab RN have worked together to provide customized collaborative inpatient rehab.  Pertaining to patient's ischemic right MCA PCA territory infarct.  Followed by neurology services.  Lovenox  for DVT prophylaxis.  She will continue on low-dose aspirin  and Plavix  x 3 months then Plavix  alone.  Mood stabilization with the use of Lexapro .  She was using some melatonin at night.  Emotional support provided.  Crestor  ongoing for hyperlipidemia.  She did complete an empiric course of IV Rocephin  for UTI with no dysuria or hematuria.  Hospital course episode of PSVT 08/11/2023 Valsalva maneuver performed terminated within 3 seconds no plan for beta-blocker  at this time and monitored.  Blood pressure remained controlled on low-dose Cozaar .  Hyperglycemia initial glucose 307 hemoglobin A1c of 3.7 not consistent with diabetes.  Hyperglycemia likely due to stress from CVA and UTI.  CAD low-dose aspirin  as prior to admission.  No chest pain or shortness of breath.   Blood pressures were monitored on TID basis and remained controlled and monitored     Rehab course: During patient's stay in rehab weekly team conferences were held to monitor patient's progress, set goals and discuss barriers to discharge. At admission, patient required contact-guard step pivot transfers minimal guard contact-guard 190 feet without assistive device  He/She  has had improvement in activity tolerance, balance, postural control as well as ability to compensate for deficits. He/She has had improvement in functional use RUE/LUE  and RLE/LLE as well as improvement in awareness.  Working with energy conservation techniques.  Requires min with/contact-guard basic self-care skills secondary to decreased cardiorespiratory endurance as well as motor apraxia decreased coordination.  Set up for upper body dressing as well as lower body dressing.Alicia Wilkerson to the shower manage water needed some prompting for full shower including hair care.  Contact-guard for toilet activity.  Contact-guard for chair to bed transfer.  Patient able to organize her clothing and to clean and dirty bags and choose clothing for the day.  Ambulates extended distances with standby assist.  Independent level for navigating stairs.  SLP follow-up for high-level cognitive deficits in the domains of delayed memory and executive functioning, specifically evident during organization and complex sequencing.  Working with memory compensatory strategies.  Recommendations of intermittent supervision.  Full family teaching completed plan discharge to home       Disposition:  Discharge disposition: 01-Home or Self  Care        Diet: Regular  Special Instructions: No driving smoking or alcohol  Continue aspirin  81 mg daily and Plavix  75 mg daily x 3 months then Plavix  alone  Medications at discharge. 1.  Tylenol  as needed 2.  Aspirin  81 mg p.o. daily 3.  Klonopin  0.5 mg p.o. twice daily as needed anxiety 4.  Plavix  75 mg p.o. daily 5.  Lexapro  10 mg p.o. nightly 6.  Crestor  20 mg p.o. nightly 7.  Magnesium  oxide 400 mg daily 8.  Omega-3 2 capsules bedtime 9.  Cozaar  12.5 mg nightly   30-35 minutes were spent completing discharge summary and discharge planning  Discharge Instructions     Ambulatory referral to Neurology   Complete by: As directed    An appointment is requested in approximately: 4 weeks multiple ischemic right MCA/PCA territory infarctions   Ambulatory referral to Physical Medicine Rehab   Complete by: As directed    Moderate complexity follow-up 1 to 2 weeks ischemic  right MCA/PCA territory infarction        Follow-up Information     Raulkar, Sven SQUIBB, MD Follow up.   Specialty: Physical Medicine and Rehabilitation Why: Office to call for appointment Contact information: [address] [place] [address] [phone]         Corina Norleen SAUNDERS, PsyD Follow up.   Specialty: Psychology Why: Moderate complexity follow-up 1 to 2 weeks for coping Contact information: [address] [place] [address] [phone]         Onita Duos, MD Follow up on 10/29/2023.   Specialty: Neurology Why: apppointment @ 9:30 am be there at 9:15 am Contact information: [address] [place] [address] [phone]                 Signed: Toribio PARAS Alicia Wilkerson 08/19/2023, 4:39 AM

## 2023-08-14 NOTE — Plan of Care (Signed)
 Patient ID: Alicia Wilkerson, female   DOB: 1951/01/06, 73 y.o.   MRN: [id number] Per CVA 3 team discussion; patient is doing well post right PCA/right frontal lobe CVA and ready for discharge on 08/19/23. Her 6 minute walk test was >1,000 feet without an assistive device. Patient has a RW, SPC and shower seat at home.Goals for discharge set for Mod I overall.  Team recommends OP follow up services and no DME.  SW confirmed son to come in to stay with her. Team addressed 13 steps (steep and narrow). Education ongoing for secondary risk management , safety, medications including DAPT x 3 months then Plavix solo and dietary modification recommendations.  Naoma Bacca

## 2023-08-14 NOTE — Plan of Care (Signed)
  Problem: RH Problem Solving Goal: LTG Patient will demonstrate problem solving for (SLP) Description: LTG:  Patient will demonstrate problem solving for basic/complex daily situations with cues  (SLP) Flowsheets (Taken 08/14/2023 1241) LTG: Patient will demonstrate problem solving for (SLP): Complex daily situations LTG Patient will demonstrate problem solving for: Modified Independent   Problem: RH Memory Goal: LTG Patient will demonstrate ability for day to day (SLP) Description: LTG:   Patient will demonstrate ability for day to day recall/carryover during cognitive/linguistic activities with assist  (SLP) Flowsheets (Taken 08/14/2023 1241) LTG: Patient will demonstrate ability for day to day recall: New information LTG: Patient will demonstrate ability for day to day recall/carryover during cognitive/linguistic activities with assist (SLP): Modified Independent   Problem: RH Awareness Goal: LTG: Patient will demonstrate awareness during functional activites type of (SLP) Description: LTG: Patient will demonstrate awareness during functional activites type of (SLP) Flowsheets (Taken 08/14/2023 1241) Patient will demonstrate during cognitive/linguistic activities awareness type of: Intellectual LTG: Patient will demonstrate awareness during cognitive/linguistic activities with assistance of (SLP): Modified Independent

## 2023-08-15 LAB — CULTURE, BLOOD (ROUTINE X 2)
Culture: NO GROWTH
Culture: NO GROWTH

## 2023-08-15 MED ORDER — SENNOSIDES-DOCUSATE SODIUM 8.6-50 MG PO TABS
1.0000 | ORAL_TABLET | Freq: Two times a day (BID) | ORAL | Status: DC
  Administered 2023-08-16 – 2023-08-18 (×5): 1 via ORAL
  Filled 2023-08-15 (×8): qty 1

## 2023-08-15 NOTE — Progress Notes (Signed)
 Physical Therapy Session Note  Patient Details  Name: Alicia Wilkerson MRN: [id number] Date of Birth: [birth year]  Today's Date: 08/15/2023 PT Individual Time: 1052-1200; 1308 - 1335 PT Individual Time Calculation (min): 68 min; 27 min   Short Term Goals: Week 1:  PT Short Term Goal 1 (Week 1): STG = LTG due to ELOS  SESSION 1 Skilled Therapeutic Interventions/Progress Updates: Patient supine in bed on entrance to room. Patient alert and agreeable to PT session.   Patient reported no pain during session  Therapeutic Activity: Bed Mobility: Pt performed supine<sit on EOB independently.  Transfers: Pt performed sit<>stand transfers in preparation for functional mobility with supervision.  Pt ambulated throughout session without AD and with supervision and mild gait deviations but able to maintain standing balance.   Neuromuscular Re-ed: - BITS Memory Recall in very minimally distracting environment (alone and silent in ortho gym). Pt performed supervision with no AD.  - Pt had 21 successful trials with 100% accuracy with words. Pt recalled up to 4 letters with no assistance, but required hinted cues to check last word (only word left on screen) and to recall. Pt also required cues to remember that big words on screen are the ones to read, and smaller ones after are the ones to put in order. Overall, pt required increased assistance with recalling short term memory that was not immediate (after a minute or so), or, pt would attempt to find the words of previous group of words vs new words that were displayed on screen.  - Pt had 17 successful trials 73.91% with 6 unsuccessful trials. Pt had increased difficulty recalling sequence as this trial contained images to find from words displayed. Pt also had short-term memory deficit of recalling to start sequence with beginning word (image) vs what was lastly given.   - Pt instructed to pick up pinch pins in sequential order from net, the place on  table next to respective bean bag color, then place on lamented dots on floor in same order. PTA cued pt to recall that black pin means to take it to end of main gym to place on mat (stated this randomly prior to setting up intervention) as pt has shown to struggle with memory recall when other tasks are underway - pt recalled this at the end of the intervention when cued to pick up blue and black pin. Pt performed task and assisted PTA with picking up items from floor and table with supervision. Pt recalled sequence throughout session without issue.    NMR performed for improvements in motor control and coordination, balance, sequencing, judgement, and self confidence/ efficacy in performing all aspects of mobility at highest level of independence.   Patient sitting EOB at end of session with brakes locked, bed alarm set, and all needs within reach.  SESSION 2 Skilled Therapeutic Interventions/Progress Updates: Patient supine in bed on entrance to room. Patient alert and agreeable to PT session.   Covering physician present at beginning and asked, if able, to record true orthostatics (communicated objective findings with Dr. Emeline).   Supine - 130/52 (73) Sitting EOB - 137/69 (89) Standing - 121/56 (74) (2-3 minute period in-between each change of position)   Following orthostatics, Pt ambulated supervision from room<>ortho gym (pt with one moment of scissoring step to catch LOB, and did so without intervention - son reported that family has noticed that pt started to drift during past few years) with son present to educate on pt's cognitive deficits with use of  BITS memory system. Pt participated in finding images in sequential order from words in same order. Pt with 69% accuracy in minimally distracting environment (PTA and son intentionally creating conversation to distract pt). Pt had difficulty recalling sequence of images to match words previously displayed throughout due to increase in  distractions. Pt and son further educated on one of pt's deficits of ability to recall new information after certain amount of time has passed.   Patient sitting EOB at end of session with brakes locked, son present and all needs within reach.     Therapy Documentation Precautions:  Precautions Precautions: Fall Restrictions Weight Bearing Restrictions Per Provider Order: No  Therapy/Group: Individual Therapy  Chanda Laperle PTA 08/15/2023, 12:44 PM

## 2023-08-15 NOTE — Progress Notes (Signed)
 Occupational Therapy Session Note  Patient Details  Name: Alicia Wilkerson MRN: [id number] Date of Birth: 1951-01-28  Today's Date: 08/15/2023 OT Individual Time: 8596-8552 OT Individual Time Calculation (min): 44 min    Short Term Goals: Week 1:  OT Short Term Goal 1 (Week 1): Patient will gather needed BADL items prior to bathing and dressing task without prompting OT Short Term Goal 2 (Week 1): STG=LTG due to LOS  Skilled Therapeutic Interventions/Progress Updates:   Patient received supine in bed - son at bedside.  Patient agreeable to OT session, and son requesting to go along.  Patient able to navigate to main gym with minimal cueing.  Patient drifts toward objects without fully colliding into objects.  Son indicates that this behavior was occurring over the past two years, and to the point he felt he had to manage her when they took walks together to keep her from colliding with people or obstacles.  Patient agrees with son's statement.  Son also indicates that multitasking, focused attention were areas of concern prior to this stroke.   Worked on gross motor coordination skills while multitasking.  Patient had no loss of balance, and able to maintain cognitive and physical task well.  Patient returned to room and left in bed with bed alarm engaged and son at bedside.  Had long discussion with patient and her son regarding discharge recommendations; e.g. Patient will need supervision upon discharge from hospital.  Patient will not need 24 hour supervision, family member could leave her long enough to run an errand x 2 hours.  Recommend assistance for medication management, meal preparation, and showering - as patient tends to have moments of lightheadedness in shower.  Patient's son plans to talk with family to determine best discharge situation to ensure she has adequate support.    Therapy Documentation Precautions:  Precautions Precautions: Fall Restrictions Weight Bearing Restrictions  Per Provider Order: No  Pain:  Denies pain     Therapy/Group: Individual Therapy  [name] M 08/15/2023, 3:42 PM

## 2023-08-15 NOTE — Progress Notes (Signed)
 PROGRESS NOTE   Subjective/Complaints: No events overnight.  Patient denies any symptomatic orthostasis.  Orthostatic vitals with OT this a.m. negative.  diastolic hypotension and bradycardia overnight as below    08/15/2023    6:21 AM 08/15/2023    6:16 AM 08/14/2023    9:28 PM  Vitals with BMI  Weight 120 lbs 13 oz    BMI 21.41    Systolic  109 125  Diastolic  55 58  Pulse  55 59    No results for input(s): GLUCAP in the last 72 hours.  P.o. intakes appropriate, continent of bowel bladder Last BM 6/18, smear  ROS: Patient denies fever, rash, sore throat, blurred vision,  nausea, vomiting, diarrhea, cough, shortness of breath or chest pain, joint or back/neck pain, headache, or mood change.    Objective:   No results found. Recent Labs    08/13/23 0450 08/14/23 0632  WBC 10.6* 8.8  HGB 11.8* 11.7*  HCT 33.7* 33.7*  PLT 301 306   Recent Labs    08/14/23 0632  NA 138  K 4.3  CL 105  CO2 26  GLUCOSE 82  BUN 12  CREATININE 0.67  CALCIUM  8.8*    Intake/Output Summary (Last 24 hours) at 08/15/2023 0909 Last data filed at 08/15/2023 0734 Gross per 24 hour  Intake 636 ml  Output --  Net 636 ml        Physical Exam: Vital Signs Blood pressure (!) 109/55, pulse (!) 55, temperature 97.8 F (36.6 C), temperature source Oral, resp. rate 18, height 5' 3 (1.6 m), weight 54.8 kg, last menstrual period 02/24/2002, SpO2 97%.  General: Alert and oriented x 3, No apparent distress.  Laying in bed. HEENT: Head is normocephalic, atraumatic, PERRLA, EOMI, sclera anicteric, oral mucosa pink and moist, dentition intact, ext ear canals clear,  Neck: Supple without JVD or lymphadenopathy Heart: Reg rate and rhythm. No murmurs rubs or gallops Chest: CTA bilaterally without wheezes, rales, or rhonchi; no distress Abdomen: Soft, non-tender, non-distended, bowel sounds positive. Extremities: No clubbing, cyanosis, or  edema. Pulses are 2+ Psych: Pt's affect is appropriate. Pt is cooperative Skin: Clean and intact without signs of breakdown. A few abrasions  Neuro:  Alert and oriented x 3. Normal insight and awareness. Intact Memory. Normal language and speech. Cranial nerve exam unremarkable. MMT: RUE and RLE 5/5 prox to distal. LUE 4/5 delt, bicep, tricep, 4+ wrist and hand. LLE 5- to 5/5 prox to distal. Sensory exam normal for light touch and pain in all 4 limbs. No limb ataxia or cerebellar signs. No abnormal tone appreciated. No pronator drift.    Musculoskeletal: Full ROM, No pain with AROM or PROM in the neck, trunk, or extremities. Posture appropriate    Physical exam unchanged from the above on reexamination 08/15/23    Assessment/Plan: 1. Functional deficits which require 3+ hours per day of interdisciplinary therapy in a comprehensive inpatient rehab setting. Physiatrist is providing close team supervision and 24 hour management of active medical problems listed below. Physiatrist and rehab team continue to assess barriers to discharge/monitor patient progress toward functional and medical goals  Care Tool:  Bathing    Body parts bathed  by patient: Right arm, Left arm, Chest, Abdomen, Front perineal area, Buttocks, Face, Left lower leg, Right lower leg, Left upper leg, Right upper leg         Bathing assist Assist Level: Supervision/Verbal cueing     Upper Body Dressing/Undressing Upper body dressing   What is the patient wearing?: Pull over shirt    Upper body assist Assist Level: Set up assist    Lower Body Dressing/Undressing Lower body dressing      What is the patient wearing?: Underwear/pull up, Pants     Lower body assist Assist for lower body dressing: Set up assist     Toileting Toileting    Toileting assist Assist for toileting: Contact Guard/Touching assist     Transfers Chair/bed transfer  Transfers assist     Chair/bed transfer assist level: Contact  Guard/Touching assist     Locomotion Ambulation   Ambulation assist      Assist level: Supervision/Verbal cueing Assistive device: No Device Max distance: 1042ft   Walk 10 feet activity   Assist     Assist level: Supervision/Verbal cueing Assistive device: No Device   Walk 50 feet activity   Assist    Assist level: Supervision/Verbal cueing Assistive device: No Device    Walk 150 feet activity   Assist    Assist level: Supervision/Verbal cueing Assistive device: No Device    Walk 10 feet on uneven surface  activity   Assist     Assist level: Contact Guard/Touching assist     Wheelchair     Assist Is the patient using a wheelchair?: No             Wheelchair 50 feet with 2 turns activity    Assist            Wheelchair 150 feet activity     Assist          Blood pressure (!) 109/55, pulse (!) 55, temperature 97.8 F (36.6 C), temperature source Oral, resp. rate 18, height 5' 3 (1.6 m), weight 54.8 kg, last menstrual period 02/24/2002, SpO2 97%.  Medical Problem List and Plan: 1. Functional deficits secondary to multiple ischemic infarcts right MCA/PCA territory.  Plan 30-day heart monitor at discharge             -patient may shower             -ELOS/Goals: 7 to 10 days, mod I goals PT/OT/SLP             -Stable for CIR therapies today including PT, OT, and SLP    2.  Antithrombotics: -DVT/anticoagulation:  Pharmaceutical: Lovenox              -antiplatelet therapy: Aspirin  81 mg daily and Plavix  85 mg daily x 3 months then Plavix  alone 3. Pain Management: Tylenol  650 mg as needed, benzocaine  for throat discomfort 4. Hx of depression/Behavior/Sleep: Lexapro  10 mg nightly, melatonin 3 mg nightly as needed, Klonopin  0.5 mg twice daily as needed anxiety             -antipsychotic agents: N/A  -mood appropriate today. Slept well  5. Neuropsych/cognition: This patient is capable of making decisions on her own behalf.              - Sister notes subtle cognitive decline prior to admission; ongoing difficulty with attention, with patient masks by redirecting conversation or concentrating on what she can do.  No sundowning.    -6/20 SLP eval today. Pt seemed quite  appropriate today on my eval.   6. Skin/Wound Care: Routine skin checks 7. Fluids/Electrolytes/Nutrition: encourage PO  I personally reviewed the patient's labs today.    -albumin 2.8---protein supp  8.  Hyperlipidemia.  Crestor  9.  UTI/sepsis.  IV Rocephin  completed. 10.  Episode of PSVT 08/11/2023.  Valsalva maneuvers and PSVT terminated within 3 seconds.  No plan for current beta-blocker due to resting heart rate in the 50s and low 60s..  11.  Hyperglycemia.  Initial glucose 307.  Hemoglobin A1c 3.7 not consistent with diabetes.  Hyperglycemia likely due to stress from CVA and UTI   -no cbg's needed  12.  CAD.  Low-dose aspirin  prior to admission.  13.  hypotension/dizziness during OT this morning 6/20 - BP ok per OT 2 min after sx    -BP's in general have been in range over last few days    -monitor for any further sx            -encourage adequate fluids  6-21: Diastolic hypotension today; ordered a.m. orthostats--negative.  Encourage p.o. fluids-with approximately 600 cc yesterday.  Add TED hose.  14.  Constipation.  - Last bowel movement Smear 6/18  -Add Senokot S1 tab twice daily  LOS: 2 days A FACE TO FACE EVALUATION WAS PERFORMED  Joesph JAYSON Likes 08/15/2023, 9:09 AM

## 2023-08-15 NOTE — Progress Notes (Signed)
 Occupational Therapy Session Note  Patient Details  Name: Alicia Wilkerson MRN: [id number] Date of Birth: June 18, 1950  Today's Date: 08/15/2023 OT Individual Time: 0920-1002 OT Individual Time Calculation (min): 42 min    Short Term Goals: Week 1:  OT Short Term Goal 1 (Week 1): Patient will gather needed BADL items prior to bathing and dressing task without prompting OT Short Term Goal 2 (Week 1): STG=LTG due to LOS  Skilled Therapeutic Interventions/Progress Updates:    Patient received sidelying in bed.  Patient agreeable to OT session declined shower as she showered yesterday.  Patient changed into clean clothing with min assist to choose clothing.  Patient walked to bathroom with supervision, completed toileting with supervision.  Practiced flight of stairs in stairwell.  Able to ascend stairs with close supervision, and descend with CGA.  Walked to [employer] with cueing for wayfinding.  Completed organizational task to address patient's impulsivity.  Patient moves and acts quickly.  She was able to catch her errors - minor with time/ questioning cues.  Patient able to state she needs more focus to successfully complete a task.  Walked back to room allowing patient to find her way, using signage and environmental cues.  Patient left up in bed with alarm engaged and call bell/ personal items in reach.    Therapy Documentation Precautions:  Precautions Precautions: Fall Restrictions Weight Bearing Restrictions Per Provider Order: No  Pain:  Denies pain     Therapy/Group: Individual Therapy  Aili Casillas M 08/15/2023, 12:25 PM

## 2023-08-15 NOTE — Plan of Care (Signed)
  Problem: Consults Goal: RH STROKE PATIENT EDUCATION Description: See Patient Education module for education specifics  Outcome: Progressing   Problem: RH BOWEL ELIMINATION Goal: RH STG MANAGE BOWEL WITH ASSISTANCE Description: STG Manage Bowel with toileting Assistance. Outcome: Progressing Goal: RH STG MANAGE BOWEL W/MEDICATION W/ASSISTANCE Description: STG Manage Bowel with Medication with mod I Assistance. Outcome: Progressing   Problem: RH SAFETY Goal: RH STG ADHERE TO SAFETY PRECAUTIONS W/ASSISTANCE/DEVICE Description: STG Adhere to Safety Precautions With cues Assistance/Device. Outcome: Progressing   Problem: RH PAIN MANAGEMENT Goal: RH STG PAIN MANAGED AT OR BELOW PT'S PAIN GOAL Description: Pain < 4 with prns Outcome: Progressing   Problem: RH KNOWLEDGE DEFICIT Goal: RH STG INCREASE KNOWLEDGE OF HYPERTENSION Description: Patient will be able to manage HTN using educational resources for medications, dietary modification independently Outcome: Progressing Goal: RH STG INCREASE KNOWLEGDE OF HYPERLIPIDEMIA Description: Patient will be able to manage HLD using educational resources for medications, dietary modification independently Outcome: Progressing Goal: RH STG INCREASE KNOWLEDGE OF STROKE PROPHYLAXIS Description: Patient will be able to manage secondary risks using educational resources for medications, dietary modification independently Outcome: Progressing

## 2023-08-16 MED ORDER — BOOST / RESOURCE BREEZE PO LIQD CUSTOM: 1.0000 | Freq: Three times a day (TID) | ORAL | Status: DC

## 2023-08-16 NOTE — Progress Notes (Signed)
 PROGRESS NOTE   Subjective/Complaints: No events overnight.   Heart rate looking better. Vitals stable. Patient without acute complaints.     08/16/2023    5:59 AM 08/15/2023    7:42 PM 08/15/2023   12:57 PM  Vitals with BMI  Systolic 115 142 863  Diastolic 64 59 56  Pulse 60 65 62   Son in the room, additional son and daughter on the phone when entering, had multiple questions about her imaging at presentation, interpretation of findings, goals for rehab, risks for recurrent stroke after rehab, patient fatigue, and expected independence.  Reviewed these items with family.  Medium bowel movement yesterday.  ROS: Patient denies fever, rash, sore throat, blurred vision,  nausea, vomiting, diarrhea, cough, shortness of breath or chest pain, joint or back/neck pain, headache, or mood change.    Objective:   No results found. Recent Labs    08/14/23 0632  WBC 8.8  HGB 11.7*  HCT 33.7*  PLT 306   Recent Labs    08/14/23 0632  NA 138  K 4.3  CL 105  CO2 26  GLUCOSE 82  BUN 12  CREATININE 0.67  CALCIUM  8.8*    Intake/Output Summary (Last 24 hours) at 08/16/2023 1007 Last data filed at 08/16/2023 0910 Gross per 24 hour  Intake 720 ml  Output --  Net 720 ml        Physical Exam: Vital Signs Blood pressure 115/64, pulse 60, temperature 97.6 F (36.4 C), temperature source Oral, resp. rate 18, height 5' 3 (1.6 m), weight 54.8 kg, last menstrual period 02/24/2002, SpO2 95%.  General: Alert and oriented x 3, No apparent distress.  Laying in bed. HEENT: Head is normocephalic, atraumatic, PERRLA, EOMI, sclera anicteric, oral mucosa pink and moist, dentition intact,  Neck: Supple without JVD or lymphadenopathy Heart: Reg rate and rhythm. No murmurs rubs or gallops Chest: CTA bilaterally without wheezes, rales, or rhonchi; no distress Abdomen: Soft, non-tender, non-distended, bowel sounds positive. Extremities: No  clubbing, cyanosis, or edema. Pulses are 2+ Psych: Pt's affect is appropriate. Pt is cooperative Skin: Clean and intact without signs of breakdown. A few abrasions  Neuro:  Alert and oriented x 3.  Normal insight and awareness.  + cognitive deficits - concentration, memory, and higher level processing - difficult to pick up on in normal conversation  Normal language and speech. Cranial nerve exam unremarkable.   MMT: all 4 limbs antigravity and against resistance, sublte L sided weakness in UE>LE.   Sensory exam normal for light touch and pain in all 4 limbs. No limb ataxia or cerebellar signs. No abnormal tone appreciated. No pronator drift.    Musculoskeletal: Full ROM, No pain with AROM or PROM in the neck, trunk, or extremities. Posture appropriate     Assessment/Plan: 1. Functional deficits which require 3+ hours per day of interdisciplinary therapy in a comprehensive inpatient rehab setting. Physiatrist is providing close team supervision and 24 hour management of active medical problems listed below. Physiatrist and rehab team continue to assess barriers to discharge/monitor patient progress toward functional and medical goals  Care Tool:  Bathing    Body parts bathed by patient: Right arm, Left arm,  Chest, Abdomen, Front perineal area, Buttocks, Face, Left lower leg, Right lower leg, Left upper leg, Right upper leg         Bathing assist Assist Level: Supervision/Verbal cueing     Upper Body Dressing/Undressing Upper body dressing   What is the patient wearing?: Pull over shirt    Upper body assist Assist Level: Set up assist    Lower Body Dressing/Undressing Lower body dressing      What is the patient wearing?: Underwear/pull up, Pants     Lower body assist Assist for lower body dressing: Set up assist     Toileting Toileting    Toileting assist Assist for toileting: Supervision/Verbal cueing     Transfers Chair/bed transfer  Transfers assist      Chair/bed transfer assist level: Supervision/Verbal cueing     Locomotion Ambulation   Ambulation assist      Assist level: Supervision/Verbal cueing Assistive device: No Device Max distance: 1038ft   Walk 10 feet activity   Assist     Assist level: Supervision/Verbal cueing Assistive device: No Device   Walk 50 feet activity   Assist    Assist level: Supervision/Verbal cueing Assistive device: No Device    Walk 150 feet activity   Assist    Assist level: Supervision/Verbal cueing Assistive device: No Device    Walk 10 feet on uneven surface  activity   Assist     Assist level: Contact Guard/Touching assist     Wheelchair     Assist Is the patient using a wheelchair?: No             Wheelchair 50 feet with 2 turns activity    Assist            Wheelchair 150 feet activity     Assist          Blood pressure 115/64, pulse 60, temperature 97.6 F (36.4 C), temperature source Oral, resp. rate 18, height 5' 3 (1.6 m), weight 54.8 kg, last menstrual period 02/24/2002, SpO2 95%.  Medical Problem List and Plan: 1. Functional deficits secondary to multiple ischemic infarcts right MCA/PCA territory.  Plan 30-day heart monitor at discharge             -patient may shower             -ELOS/Goals: 7 to 10 days, mod I goals PT/OT/SLP             -Stable for CIR therapies today including PT, OT, and SLP     - 6/22: Sons note cognitive changes and wandering gait starting several months prior to admission - concerned about prognosis of cognitive and functional status when returning home, as well as ongoing fatigue. Wish to discuss need for 24/7 assistance prior to teams Wednesday  - defer to primary medical/therapy team but per chart review seems reasonable for intermittent.   2.  Antithrombotics: -DVT/anticoagulation:  Pharmaceutical: Lovenox              -antiplatelet therapy: Aspirin  81 mg daily and Plavix  85 mg daily x 3 months  then Plavix  alone 3. Pain Management: Tylenol  650 mg as needed, benzocaine  for throat discomfort 4. Hx of depression/Behavior/Sleep: Lexapro  10 mg nightly, melatonin 3 mg nightly as needed, Klonopin  0.5 mg twice daily as needed anxiety             -antipsychotic agents: N/A  -mood appropriate today. Slept well  5. Neuropsych/cognition: This patient is capable of making decisions on her own behalf.             -  Sister notes subtle cognitive decline prior to admission; ongoing difficulty with attention, with patient masks by redirecting conversation or concentrating on what she can do.  No sundowning.    -6/20 SLP eval today. Pt seemed quite appropriate today on my eval.   6/22: Family notes increased fatigue and anxiety (ex. Got very frustrated and confused trying to use her phone apps last evening); discussed fatigue as common with strokes and avoiding overstimulation -> anxiety.    - Family additionally concerned with cognitive decline prior to stroke, findings of cererbral atrophy on imaging - discussed OP neurology/neuropsych for formal workup, they would like the pursue this  6. Skin/Wound Care: Routine skin checks 7. Fluids/Electrolytes/Nutrition: encourage PO  I personally reviewed the patient's labs today.    -albumin 2.8---protein supplementation with Boost  8.  Hyperlipidemia.  Crestor  9.  UTI/sepsis.  IV Rocephin  completed. 10.  Episode of PSVT 08/11/2023.  Valsalva maneuvers and PSVT terminated within 3 seconds.  No plan for current beta-blocker due to resting heart rate in the 50s and low 60s..  11.  Hyperglycemia.  Initial glucose 307.  Hemoglobin A1c 3.7 not consistent with diabetes.  Hyperglycemia likely due to stress from CVA and UTI   -no cbg's needed  12.  CAD.  Low-dose aspirin  prior to admission.  13.  hypotension/dizziness during OT this morning 6/20 - BP ok per OT 2 min after sx    -BP's in general have been in range over last few days    -monitor for any further  sx            -encourage adequate fluids  6-21: Diastolic hypotension today; ordered a.m. orthostats--negative.  Encourage p.o. fluids-with approximately 600 cc yesterday.  Add TED hose.  6/22: BP is looking better--no s/s, resolved  14.  Constipation.  - Last bowel movement Smear 6/18  -Add Senokot S1 tab twice daily  Medium bowel movement 6/21 per patient report  LOS: 3 days A FACE TO FACE EVALUATION WAS PERFORMED - spent approximately 35 minutes on chart review, patient interview, physical exam + reviewing imaging, diagnoses, clinical expectations for rehab and prognosis with patient's family.   Joesph JAYSON Likes 08/16/2023, 10:07 AM

## 2023-08-16 NOTE — Progress Notes (Signed)
 Occupational Therapy Session Note  Patient Details  Name: DIANELLY FERRAN MRN: [id number] Date of Birth: 01/29/1951  Today's Date: 08/16/2023 OT Individual Time: 1015-1045 OT Individual Time Calculation (min): 30 min    Short Term Goals: Week 1:  OT Short Term Goal 1 (Week 1): Patient will gather needed BADL items prior to bathing and dressing task without prompting OT Short Term Goal 2 (Week 1): STG=LTG due to LOS  Skilled Therapeutic Interventions/Progress Updates:     Patient resting in bed at the time of arrival with family present.  The pt indicated that she rested well and she reported slight pain response  of a 3 on a 0-10 with the upper region of  her jaw .  The pt went on to say that nursing was made aware of her concerns.  The pt was able to come from supine in bed to EOB with SBA.  The pt was able to ambulate from bed LOF to the w/c for completion of UB exercise.  The pt complete UB exercise  2 sets of 10 for bicep curl, horizontal abduction, and elbow extension with rest breaks as needed, the pt required 1 rest break.  The pt was encouraged to incorporate relaxation breathing for > compliance. The pt was instructed in some modified exercises that she could perform at home such as UB cycle, wall push-up in sitting, single stair step ups, and chair marching exercises 2 sets of 10 with rest breaks as needed. At the end of the session, the patient was able to ambulate back to her bed with her call light within reach and all additional needs addressed.   Therapy Documentation Precautions:  Precautions Precautions: Fall Restrictions Weight Bearing Restrictions Per Provider Order: No  Therapy/Group: Individual Therapy  Elvera JONETTA Mace 08/16/2023, 5:05 PM

## 2023-08-16 NOTE — Progress Notes (Signed)
 Physical Therapy Session Note  Patient Details  Name: Alicia Wilkerson MRN: [id number] Date of Birth: 1951/01/28  Today's Date: 08/16/2023 PT Individual Time: 9056-8980 PT Individual Time Calculation (min): 36 min   Short Term Goals: Week 1:  PT Short Term Goal 1 (Week 1): STG = LTG due to ELOS  Skilled Therapeutic Interventions/Progress Updates: Pt received supine in bed on the phone with son present in the room; pt denies pain and is agreeable to PT tx.  Gait through breezeway > 400 ft, down elevator to main entrance, through hospital and then back to rehab unit with PT CGA and PT verbal cues for looking around hospital while walking straight to work on multitasking. Pt noted to slightly scissor with walking. Son voices concerns regarding Mom's asymmetry with walking and states that some was present prior to stroke but it has since worsened; he is also concerned about her scissoring and inability to walk in a straight line. PT stated that today's gait was functional and did not witness the almost bumping into people or things but that it has been prior documented in her other treatments. He also voiced concerns over her cognition and not being able to recall directions for games that they played last night that she has played all her life. PT encouraged him to speak with the MD regarding concerns.  Therex: standing hip flexion to increase functional unilateral LE strength/balance x 20 followed by narrow base of support bil UE reaches x 20; Airex squats x 10 with concentration on quality of control and eyes closed static standing x 1 min on Airex with minimal sway.  Pt returned to room with son present; supine in bed (MI), all needs within reach; bed alarm on. No complaints of pain.      Therapy Documentation Precautions:  Precautions Precautions: Fall Restrictions Weight Bearing Restrictions Per Provider Order: No    Therapy/Group: Individual Therapy  [name] 08/16/2023, 7:12 AM

## 2023-08-16 NOTE — Plan of Care (Signed)
  Problem: Consults Goal: RH STROKE PATIENT EDUCATION Description: See Patient Education module for education specifics  Outcome: Progressing   Problem: RH BOWEL ELIMINATION Goal: RH STG MANAGE BOWEL WITH ASSISTANCE Description: STG Manage Bowel with toileting Assistance. Outcome: Progressing Goal: RH STG MANAGE BOWEL W/MEDICATION W/ASSISTANCE Description: STG Manage Bowel with Medication with mod I Assistance. Outcome: Progressing   Problem: RH SAFETY Goal: RH STG ADHERE TO SAFETY PRECAUTIONS W/ASSISTANCE/DEVICE Description: STG Adhere to Safety Precautions With cues Assistance/Device. Outcome: Progressing   Problem: RH PAIN MANAGEMENT Goal: RH STG PAIN MANAGED AT OR BELOW PT'S PAIN GOAL Description: Pain < 4 with prns Outcome: Progressing

## 2023-08-17 NOTE — Progress Notes (Signed)
 Physical Therapy Session Note  Patient Details  Name: Alicia Wilkerson MRN: [id number] Date of Birth: [birth year]  Today's Date: 08/17/2023 PT Individual Time: 1100-1155 PT Individual Time Calculation (min): 55 min   Short Term Goals: Week 1:  PT Short Term Goal 1 (Week 1): STG = LTG due to ELOS  Skilled Therapeutic Interventions/Progress Updates:    Patient just finished blow drying her hair while standing at the sink on entrance to room. Patient alert and agreeable to PT session.   Patient reported that she feels like she walks well and that she has always struggled with her balance. Today we focused on dual tasking, following directions, and pattern recognition while playing Wii sport's tennis and bowling. Pt also performed several cognitive task on the BITS device.   Therapeutic Activity:  Wii Sports  - Tennis 2 games -Bowling  1/2 game   Pt struggled to remember how to perform simple task and when cued constantly repeated the same mistakes. Pt needed several cues to perform simple button pressing and could not remember how to repeat a process that she previously performed correctly. Struggled to repeat success or process why some inputs resulted in failure and why some resulted in success.   Neuromuscular Re-ed: NMR facilitated during session with focus on cognitive processes, reaction speed, and memory.  -BITS Device  -maze test -trail making part B -geoboard duplication with memory component  -word order memory    NMR performed for improvements in motor control and coordination, balance, sequencing, judgement, and self confidence/ efficacy in performing all aspects of mobility at highest level of independence.    Patient left in bed at end of session with son sitting in recliner for supervision and all needs within reach.   Therapy Documentation Precautions:  Precautions Precautions: Fall Restrictions Weight Bearing Restrictions Per Provider Order:  No    Therapy/Group: Individual Therapy  Anallely Rosell 08/17/2023, 12:30 PM

## 2023-08-17 NOTE — Progress Notes (Addendum)
 PROGRESS NOTE   Subjective/Complaints: No new concerns this morning, sister has concerns regarding discharge, will call her today, reviewed vitals and Sw note, BP slightly soft     08/17/2023    6:39 AM 08/16/2023    7:33 PM 08/16/2023    1:07 PM  Vitals with BMI  Systolic 116 102 877  Diastolic 58 65 59  Pulse 56 64 66     ROS: Patient denies fever, rash, sore throat, blurred vision,  nausea, vomiting, diarrhea, cough, shortness of breath or chest pain, joint or back/neck pain, headache, or mood change.    Objective:   No results found. No results for input(s): WBC, HGB, HCT, PLT in the last 72 hours.  No results for input(s): NA, K, CL, CO2, GLUCOSE, BUN, CREATININE, CALCIUM  in the last 72 hours.   Intake/Output Summary (Last 24 hours) at 08/17/2023 1124 Last data filed at 08/16/2023 1906 Gross per 24 hour  Intake 480 ml  Output --  Net 480 ml        Physical Exam: Vital Signs Blood pressure (!) 116/58, pulse (!) 56, temperature 98.1 F (36.7 C), temperature source Oral, resp. rate 18, height 5' 3 (1.6 m), weight 54.8 kg, last menstrual period 02/24/2002, SpO2 94%.  General: Alert and oriented x 3, No apparent distress.  Laying in bed. HEENT: Head is normocephalic, atraumatic, PERRLA, EOMI, sclera anicteric, oral mucosa pink and moist, dentition intact,  Neck: Supple without JVD or lymphadenopathy Heart: Reg rate and rhythm. No murmurs rubs or gallops Chest: CTA bilaterally without wheezes, rales, or rhonchi; no distress Abdomen: Soft, non-tender, non-distended, bowel sounds positive. Extremities: No clubbing, cyanosis, or edema. Pulses are 2+ Psych: Pt's affect is appropriate. Pt is cooperative Skin: Clean and intact without signs of breakdown. A few abrasions  Neuro:  Alert and oriented x 3.  Normal insight and awareness.  + cognitive deficits - concentration, memory, and higher  level processing - difficult to pick up on in normal conversation  Normal language and speech. Cranial nerve exam unremarkable.   MMT: all 4 limbs antigravity and against resistance, 4/5 strength left side  Sensory exam normal for light touch and pain in all 4 limbs. No limb ataxia or cerebellar signs. No abnormal tone appreciated. No pronator drift.    Musculoskeletal: Full ROM, No pain with AROM or PROM in the neck, trunk, or extremities. Posture appropriate     Assessment/Plan: 1. Functional deficits which require 3+ hours per day of interdisciplinary therapy in a comprehensive inpatient rehab setting. Physiatrist is providing close team supervision and 24 hour management of active medical problems listed below. Physiatrist and rehab team continue to assess barriers to discharge/monitor patient progress toward functional and medical goals  Care Tool:  Bathing    Body parts bathed by patient: Right arm, Left arm, Chest, Abdomen, Front perineal area, Buttocks, Face, Left lower leg, Right lower leg, Left upper leg, Right upper leg         Bathing assist Assist Level: Supervision/Verbal cueing     Upper Body Dressing/Undressing Upper body dressing   What is the patient wearing?: Pull over shirt    Upper body assist Assist Level: Set up  assist    Lower Body Dressing/Undressing Lower body dressing      What is the patient wearing?: Underwear/pull up, Pants     Lower body assist Assist for lower body dressing: Set up assist     Toileting Toileting    Toileting assist Assist for toileting: Contact Guard/Touching assist     Transfers Chair/bed transfer  Transfers assist     Chair/bed transfer assist level: Supervision/Verbal cueing     Locomotion Ambulation   Ambulation assist      Assist level: Supervision/Verbal cueing Assistive device: No Device Max distance: 1023ft   Walk 10 feet activity   Assist     Assist level: Supervision/Verbal  cueing Assistive device: No Device   Walk 50 feet activity   Assist    Assist level: Supervision/Verbal cueing Assistive device: No Device    Walk 150 feet activity   Assist    Assist level: Supervision/Verbal cueing Assistive device: No Device    Walk 10 feet on uneven surface  activity   Assist     Assist level: Contact Guard/Touching assist     Wheelchair     Assist Is the patient using a wheelchair?: No             Wheelchair 50 feet with 2 turns activity    Assist            Wheelchair 150 feet activity     Assist          Blood pressure (!) 116/58, pulse (!) 56, temperature 98.1 F (36.7 C), temperature source Oral, resp. rate 18, height 5' 3 (1.6 m), weight 54.8 kg, last menstrual period 02/24/2002, SpO2 94%.  Medical Problem List and Plan: 1. Functional deficits secondary to multiple ischemic infarcts right MCA/PCA territory.  Plan 30-day heart monitor at discharge             -patient may shower             -ELOS/Goals: 7 to 10 days, mod I goals PT/OT/SLP             -Stable for CIR therapies today including PT, OT, and SLP     - 6/22: Sons note cognitive changes and wandering gait starting several months prior to admission - concerned about prognosis of cognitive and functional status when returning home, as well as ongoing fatigue. Wish to discuss need for 24/7 assistance prior to teams Wednesday  - defer to primary medical/therapy team but per chart review seems reasonable for intermittent.   Called sister to discuss her concerns regarding her upcoming discharge  2.  Antithrombotics: -DVT/anticoagulation:  Pharmaceutical: Lovenox              -antiplatelet therapy: continue Aspirin  81 mg daily and Plavix  85 mg daily x 3 months then Plavix  alone  3. Jaw pain: continue Tylenol  650 mg as needed, benzocaine  for throat discomfort, discussed that this has been going on since he had her first stroke  4. Hx of  depression/Behavior/Sleep: continue Lexapro  10 mg nightly, melatonin 3 mg nightly as needed, Klonopin  0.5 mg twice daily as needed anxiety             -antipsychotic agents: N/A  -mood appropriate today. Slept well  5. Neuropsych/cognition: This patient is capable of making decisions on her own behalf.             - Sister notes subtle cognitive decline prior to admission; ongoing difficulty with attention, with patient masks by redirecting conversation  or concentrating on what she can do.  No sundowning.    -6/20 SLP eval today. Pt seemed quite appropriate today on my eval.   6/22: Family notes increased fatigue and anxiety (ex. Got very frustrated and confused trying to use her phone apps last evening); discussed fatigue as common with strokes and avoiding overstimulation -> anxiety.    - Family additionally concerned with cognitive decline prior to stroke, findings of cererbral atrophy on imaging - discussed OP neurology/neuropsych for formal workup, they would like the pursue this, asked Rolan to please place referral  6. Skin/Wound Care: Routine skin checks 7. Fluids/Electrolytes/Nutrition: encourage PO  I personally reviewed the patient's labs today.    -albumin 2.8---protein supplementation with Boost  8.  Hyperlipidemia. continue Crestor   9.  UTI/sepsis.  IV Rocephin  completed.  10.  Episode of PSVT 08/11/2023.  Valsalva maneuvers and PSVT terminated within 3 seconds.  No plan for current beta-blocker due to resting heart rate in the 50s and low 60s..  11.  Hyperglycemia.  Initial glucose 307.  Hemoglobin A1c 3.7 not consistent with diabetes.  Hyperglycemia likely due to stress from CVA and UTI   -no cbg's needed  12.  CAD.  Low-dose aspirin  prior to admission.  13.  Hypotension/dizziness during OT this morning 6/20 - BP ok per OT 2 min after sx    -BP's in general have been in range over last few days    -monitor for any further sx            -encourage adequate fluids  6-21:  Diastolic hypotension today; ordered a.m. orthostats--negative.  Encourage p.o. fluids-with approximately 600 cc yesterday.  Add TED hose.  BP reviewed and slightly soft  14.  Constipation.  - Last bowel movement Smear 6/18  Continue Senokot S1 tab twice daily  Medium bowel movement 6/21 per patient report  LOS: 4 days   Sven SHAUNNA Elks 08/17/2023, 11:24 AM

## 2023-08-17 NOTE — Progress Notes (Signed)
 Occupational Therapy Session Note  Patient Details  Name: Alicia Wilkerson MRN: [id number] Date of Birth: [birth year]  {CHL IP REHAB OT TIME CALCULATIONS:[id number]}   Short Term Goals: Week 1:  OT Short Term Goal 1 (Week 1): Patient will gather needed BADL items prior to bathing and dressing task without prompting OT Short Term Goal 2 (Week 1): STG=LTG due to LOS  Skilled Therapeutic Interventions/Progress Updates:     Patient agreeable to participate in OT session. Reports *** pain level.   Patient participated in skilled OT session focusing on ***. Therapist facilitated/assessed/developed/educated/integrated/elicited *** in order to improve/facilitate/promote   Therapy Documentation Precautions:  Precautions Precautions: Fall Restrictions Weight Bearing Restrictions Per Provider Order: No  Therapy/Group: Individual Therapy  Leita Howell, OTR/L,CBIS  Supplemental OT - [facility] and [facility] Secure Chat Preferred   08/17/2023, 5:54 PM

## 2023-08-17 NOTE — Progress Notes (Signed)
 Occupational Therapy Session Note  Patient Details  Name: Alicia Wilkerson MRN: [id number] Date of Birth: 1950/07/08  Today's Date: 08/17/2023 OT Individual Time: 9098-8984 OT Individual Time Calculation (min): 74 min    Short Term Goals: Week 1:  OT Short Term Goal 1 (Week 1): Patient will gather needed BADL items prior to bathing and dressing task without prompting OT Short Term Goal 2 (Week 1): STG=LTG due to LOS  Skilled Therapeutic Interventions/Progress Updates:    Patient received seated on edge of bed.  Patient agreeable to OT and agreeable to shower - excited that her daughter brought her hair products.  Patient stood for the majority of her shower, but opted to sit at times - nice to see some anticipatory awareness!  Patient able to organize her clothing into clean and dirty bags, and choose clothing for the day.  Walked to shower, managed water, needed no prompting for full shower including hair care.  Patient dressed herself without assistance, and able to put in hearing aides and contacts.   Patient used curling brush and hairdryer to style her hair.  Patient expressing frustration at not knowing who will  be with her when she leaves the hospital.  She is aware of recommendation to not drive and to have supervision for showering and medication management.  Direct handoff to PT.    Therapy Documentation Precautions:  Precautions Precautions: Fall Restrictions Weight Bearing Restrictions Per Provider Order: No   Pain: Pain Assessment Pain Score: 0-No pain     Therapy/Group: Individual Therapy  Germain Koopmann M 08/17/2023, 1:03 PM

## 2023-08-17 NOTE — Plan of Care (Signed)
  Problem: Consults Goal: RH STROKE PATIENT EDUCATION Description: See Patient Education module for education specifics  Outcome: Progressing   Problem: RH BOWEL ELIMINATION Goal: RH STG MANAGE BOWEL WITH ASSISTANCE Description: STG Manage Bowel with toileting Assistance. Outcome: Progressing Goal: RH STG MANAGE BOWEL W/MEDICATION W/ASSISTANCE Description: STG Manage Bowel with Medication with mod I Assistance. Outcome: Progressing   Problem: RH SAFETY Goal: RH STG ADHERE TO SAFETY PRECAUTIONS W/ASSISTANCE/DEVICE Description: STG Adhere to Safety Precautions With cues Assistance/Device. Outcome: Progressing   Problem: RH PAIN MANAGEMENT Goal: RH STG PAIN MANAGED AT OR BELOW PT'S PAIN GOAL Description: Pain < 4 with prns Outcome: Progressing   Problem: RH KNOWLEDGE DEFICIT Goal: RH STG INCREASE KNOWLEDGE OF HYPERTENSION Description: Patient will be able to manage HTN using educational resources for medications, dietary modification independently Outcome: Progressing Goal: RH STG INCREASE KNOWLEGDE OF HYPERLIPIDEMIA Description: Patient will be able to manage HLD using educational resources for medications, dietary modification independently Outcome: Progressing Goal: RH STG INCREASE KNOWLEDGE OF STROKE PROPHYLAXIS Description: Patient will be able to manage secondary risks using educational resources for medications, dietary modification independently Outcome: Progressing

## 2023-08-17 NOTE — Progress Notes (Signed)
 Occupational Therapy Session Note  Patient Details  Name: Alicia Wilkerson MRN: [id number] Date of Birth: Dec 22, 1950  Today's Date: 08/17/2023 OT Individual Time: 1418-1530 OT Individual Time Calculation (min): 72 min    Short Term Goals: Week 1:  OT Short Term Goal 1 (Week 1): Patient will gather needed BADL items prior to bathing and dressing task without prompting OT Short Term Goal 2 (Week 1): STG=LTG due to LOS  Skilled Therapeutic Interventions/Progress Updates:     Pt received sitting up in bed dressed for the day with TEDs donned. Pt presenting to be in good spirits receptive to skilled OT session reporting 0/10 pain- OT offering intermittent rest breaks, repositioning, and therapeutic support to optimize participation in therapy session.   Pt completed functional mobility to therapy gym no AD mod I. Engaged Pt in simulated cooking activity to address functional cognition and dual tasking deficits. Tasked Pt with following 8 step recipe including retrieving all neccessary food and cooking utensil, following recipe to prepare the meal, and cleaning up environment following. She was able to appropriately sequence through activity and demonstrate appropriate safety awareness during task, however some unorganized thinking skills during activity with Pt quickly moving from one step to the next with verbal cues provided for pacing. During activity, provided education on energy conservation and fall prevention strategies to increase safety. Recommending Pt have supervision for cooking tasks as she occasionally becomes light headed during long periods of standing.   Pt expressing frustration about accessing her MyChart account stating It has taken me an hour to get in this week and I want to know what is going on. Provided therapeutic support and engaged Pt in sequencing through steps of accessing account via her phone and instructed Pt to write down the steps so she can later access it  independently. Pt able follow written steps to access account following demonstration mod I.   Worked on gross motor coordination dual tasking skills by engaging Pt in ball toss activity while ambulating through busy gym. Pt tasked with tossing 2.2# weighted ball while walking forwards/backwards and side stepping completing task with distant supervision, no LOB. Pt then tasked with visually scanning from R<>L while walking back to her room no AD and naming off animal and fruits totaling 10 of each. Pt able to complete task with distant SUP provided for safety.   Pt was left resting in bed with call bell in reach, bed alarm on, and all needs met.    Therapy Documentation Precautions:  Precautions Precautions: Fall Restrictions Weight Bearing Restrictions Per Provider Order: No  Therapy/Group: Individual Therapy  Katheryn SHAUNNA Mines 08/17/2023, 8:02 AM

## 2023-08-17 NOTE — Progress Notes (Signed)
 Occupational Therapy Discharge Summary  Patient Details  Name: Alicia Wilkerson MRN: [id number] Date of Birth: [birth year]  Date of Discharge from OT service:{Time; dates multiple:[id number]}     Patient has met 9 of 10 long term goals due to improved activity tolerance, improved balance, functional use of  LEFT upper and LEFT lower extremity, improved attention, improved awareness, and improved coordination.  Patient to discharge at overall Supervision level.  Patient's care partner is independent to provide the necessary cognitive assistance at discharge.    Reasons goals not met: NA  Recommendation:  Patient will benefit from ongoing skilled OT services in outpatient setting to continue to advance functional skills in the area of BADL, iADL, and Reduce care partner burden.  Equipment: No equipment provided  Reasons for discharge: treatment goals met and discharge from hospital  Patient/family agrees with progress made and goals achieved: Yes  OT Discharge Precautions/Restrictions  Precautions Precautions: Fall Restrictions Weight Bearing Restrictions Per Provider Order: No Pain Pain Assessment Pain Score: 0-No pain ADL ADL Eating: Independent Where Assessed-Eating: Chair Grooming: Independent Where Assessed-Grooming: Standing at sink Upper Body Bathing: Independent Where Assessed-Upper Body Bathing: Shower Lower Body Bathing: Modified independent Where Assessed-Lower Body Bathing: Shower Upper Body Dressing: Independent Where Assessed-Upper Body Dressing: Chair Lower Body Dressing: Modified independent Where Assessed-Lower Body Dressing: Chair Toileting: Independent Where Assessed-Toileting: Teacher, adult education: Community education officer Method: Insurance claims handler: Close supervison Web designer Method: Ship broker: Insurance underwriter: Administrator, arts Method:  Designer, industrial/product: Sales promotion account executive Baseline Vision/History: 1 Wears glasses (or contacts) Patient Visual Report: No change from baseline Vision Assessment?: Yes Eye Alignment: Within Functional Limits Ocular Range of Motion: Within Functional Limits Alignment/Gaze Preference: Within Defined Limits Tracking/Visual Pursuits: Able to track stimulus in all quads without difficulty Saccades: Within functional limits Convergence: Impaired - to be further tested in functional context Visual Fields: No apparent deficits Perception  Perception: Within Functional Limits Praxis Praxis: WFL Cognition Cognition Overall Cognitive Status: Impaired/Different from baseline Arousal/Alertness: Awake/alert Orientation Level: Person;Place;Situation Person: Oriented Place: Oriented Situation: Oriented Memory: Impaired Memory Impairment: Retrieval deficit;Storage deficit Attention: Selective Sustained Attention: Appears intact Selective Attention: Appears intact Awareness: Impaired Awareness Impairment: Anticipatory impairment Problem Solving: Impaired Problem Solving Impairment: Functional complex Executive Function: Self Monitoring;Self Correcting Sequencing Impairment: Functional complex Organizing: Impaired Organizing Impairment: Functional complex Self Monitoring: Impaired Self Monitoring Impairment: Functional complex Self Correcting: Impaired Self Correcting Impairment: Functional complex Safety/Judgment: Impaired Brief Interview for Mental Status (BIMS) Repetition of Three Words (First Attempt): 3 Temporal Orientation: Year: Correct Temporal Orientation: Month: Accurate within 5 days Temporal Orientation: Day: Correct Recall: Sock: Yes, no cue required Recall: Blue: Yes, no cue required Recall: Bed: Yes, no cue required BIMS Summary Score: 15 Sensation Sensation Light Touch: Appears Intact Hot/Cold: Appears Intact Proprioception: Impaired by  gross assessment Stereognosis: Appears Intact Coordination Gross Motor Movements are Fluid and Coordinated: No Fine Motor Movements are Fluid and Coordinated: Yes Motor  Motor Motor: Hemiplegia;Motor apraxia Motor - Discharge Observations: motor planning improving, very mild left sided impairments Mobility  Bed Mobility Bed Mobility: Rolling Right;Rolling Left;Supine to Sit;Sit to Supine Rolling Right: Independent Rolling Left: Independent Supine to Sit: Independent Sit to Supine: Independent Transfers Sit to Stand: Independent Stand to Sit: Independent  Trunk/Postural Assessment  Cervical Assessment Cervical Assessment: Within Functional Limits Thoracic Assessment Thoracic Assessment: Within Functional Limits Lumbar Assessment Lumbar Assessment: Within Functional Limits Postural Control Postural Control: Within Functional Limits  Balance Balance Balance  Assessed: Yes Static Sitting Balance Static Sitting - Balance Support: No upper extremity supported;Feet supported Static Sitting - Level of Assistance: 7: Independent Dynamic Sitting Balance Dynamic Sitting - Balance Support: No upper extremity supported;Feet supported;During functional activity Dynamic Sitting - Level of Assistance: 7: Independent Static Standing Balance Static Standing - Balance Support: No upper extremity supported;During functional activity Static Standing - Level of Assistance: 7: Independent Dynamic Standing Balance Dynamic Standing - Balance Support: No upper extremity supported;During functional activity Dynamic Standing - Level of Assistance: 7: Independent Extremity/Trunk Assessment RUE Assessment RUE Assessment: Within Functional Limits General Strength Comments: Grip strength 50lb LUE Assessment LUE Assessment: Within Functional Limits General Strength Comments: Grip strength 38lb   Fransico Allean HERO 08/17/2023, 9:54 AM

## 2023-08-18 ENCOUNTER — Other Ambulatory Visit (HOSPITAL_COMMUNITY): Payer: Self-pay

## 2023-08-18 MED ORDER — FISH OIL 600 MG PO CAPS
2.0000 | ORAL_CAPSULE | Freq: Every day | ORAL | 0 refills | Status: AC
  Filled 2023-08-18: qty 30, fill #0

## 2023-08-18 MED ORDER — LOSARTAN POTASSIUM 25 MG PO TABS
12.5000 mg | ORAL_TABLET | Freq: Every day | ORAL | Status: DC
Start: 2023-08-18 — End: 2023-08-19
  Administered 2023-08-18 – 2023-08-19 (×2): 12.5 mg via ORAL
  Filled 2023-08-18 (×2): qty 1

## 2023-08-18 MED ORDER — LOSARTAN POTASSIUM 25 MG PO TABS
12.5000 mg | ORAL_TABLET | Freq: Every day | ORAL | 0 refills | Status: DC
  Filled 2023-08-18: qty 30, 60d supply, fill #0

## 2023-08-18 MED ORDER — ESCITALOPRAM OXALATE 10 MG PO TABS
10.0000 mg | ORAL_TABLET | Freq: Every day | ORAL | 0 refills | Status: DC
  Filled 2023-08-18: qty 30, 30d supply, fill #0

## 2023-08-18 MED ORDER — MAGNESIUM OXIDE -MG SUPPLEMENT 400 (240 MG) MG PO TABS
400.0000 mg | ORAL_TABLET | Freq: Every day | ORAL | 0 refills | Status: DC
  Filled 2023-08-18: qty 30, 30d supply, fill #0

## 2023-08-18 MED ORDER — CLONAZEPAM 0.5 MG PO TABS
0.5000 mg | ORAL_TABLET | Freq: Two times a day (BID) | ORAL | 0 refills | Status: DC | PRN
  Filled 2023-08-18: qty 30, 15d supply, fill #0

## 2023-08-18 MED ORDER — CLOPIDOGREL BISULFATE 75 MG PO TABS
75.0000 mg | ORAL_TABLET | Freq: Every day | ORAL | 0 refills | Status: DC
  Filled 2023-08-18: qty 30, 30d supply, fill #0

## 2023-08-18 MED ORDER — ROSUVASTATIN CALCIUM 20 MG PO TABS
20.0000 mg | ORAL_TABLET | Freq: Every day | ORAL | 0 refills | Status: DC
  Filled 2023-08-18 – 2023-08-19 (×2): qty 30, 30d supply, fill #0

## 2023-08-18 NOTE — Progress Notes (Signed)
 Inpatient Rehabilitation Care Coordinator Discharge Note   Patient Details  Name: Alicia Wilkerson MRN: [id number] Date of Birth: [birth year]   Discharge location: HOME WITH FRIEND STAYING FOR A COUPLE DAYS FOR THE TRANSITION  Length of Stay: 6 DAYS  Discharge activity level: MOD/I LEVEL  Home/community participation: ACTIVE  Patient response un:Yzjouy Literacy - How often do you need to have someone help you when you read instructions, pamphlets, or other written material from your doctor or pharmacy?: Never  Patient response un:Dnrpjo Isolation - How often do you feel lonely or isolated from those around you?: Rarely  Services provided included: MD, RD, PT, OT, SLP, RN, CM, TR, Pharmacy, SW  Financial Services:  Financial Services Utilized: Private Insurance [employer] MEDICARE  Choices offered to/list presented to: PT  Follow-up services arranged:  Outpatient    Outpatient Servicies: [facility] [address]  PT  OT  SP WILL CALL YOU TO SET UP FOLLOW UP APPOINTMENTS  SWITCHED TO [facility] WORKER SENT TO WRONG ONE.   PT REQUESTING TUB BENCH SO WILL NOT HAVE TO HAVE SOMEONE GO INTO HER ATTIC FOR THE OTHER ONE. AWARE PRIVATE PAY  Patient response to transportation need: Is the patient able to respond to transportation needs?: Yes In the past 12 months, has lack of transportation kept you from medical appointments or from getting medications?: No In the past 12 months, has lack of transportation kept you from meetings, work, or from getting things needed for daily living?: No   Patient/Family verbalized understanding of follow-up arrangements:  Yes  Individual responsible for coordination of the follow-up plan: SELF AND [name]-DAUGHTER [phone]  Confirmed correct DME delivered: Raymonde Asberry MATSU 08/18/2023    Comments (or additional information):SON WAS SUPPOSED TO COME AND DECIDED COULD NOT. FRIEND TO STAY FOR A FEW DAYS TO MAKE SURE TRANSITION IS GOOD  FROM HOSPITAL-HOME. DAUGHTER WENT BACK TO [place] AND SISTER IN [place]. MD FELT PT CAPABLE OF MAKING HER OWN DECISIONS. DISCUSSION WITH PT AND FRIEND WHO WILL STAY WITH A FEW DAYS TO MAKE SURE DOES WELL AT HOME FOR TRANSITION. WILL ORDER TB BENCH SO WILL NOT HAVE TO GO INTO ATTIC TO GET.    Manasvini Whatley G

## 2023-08-18 NOTE — Progress Notes (Signed)
 Speech Language Pathology Discharge Summary  Patient Details  Name: Alicia Wilkerson MRN: [id number] Date of Birth: [birth year]  Date of Discharge from SLP service:August 18, 2023  Today's Date: 08/18/2023 SLP Individual Time: 1330-1430 SLP Individual Time Calculation (min): 60 min   Skilled Therapeutic Interventions:  Pt greeted at bedside, as she was completing a phone call w/ family re upcoming d/c. She was very pleasant and cooperative throughout tx tasks targeting cognition. She initially completed a verbal time management task w/ minA for information processing, working memory, and calculations. However, anticipate hearing deficits negatively impacted success as she completed a written money management task independently (given additional processing time). She also completed a check balancing task @ modI. She then benefited from s cues for organization during complex task re data sorting. SLP introduced apps for pt to download that target various cognitive domains d/t her report of enjoying brain games. She verbalized understanding of final education re cognition and d/c recommendations, including recommendation not to drive and demonstrated adequate reasoning overall. Direct handoff completed w/ PT at the end of tx tasks.    Patient has met 2 of 3 long term goals.  Patient to discharge at overall Supervision;Modified Independent level.  Reasons goals not met: short LOS and # of tx sessions   Clinical Impression/Discharge Summary:  Pt has made good progress this stay despite minimal tx sessions completed d/t short LOS. She continues to demonstrate slight cognitive deficits during complex cognitive tasks. Deficits noted in the areas of attention to detail, organization, and complex problem solving. Functional cognition WFL. She varies from modI to supervision at this time. Do not anticipate need for 24/7 supervision at this time. Pt education complete. She would benefit from continued ST upon  d/c to maximize pt independence and facilitate return to PLOF.   Care Partner:  Caregiver Able to Provide Assistance: Yes  Type of Caregiver Assistance: Cognitive  Recommendation:  Outpatient SLP  Rationale for SLP Follow Up: Maximize cognitive function and independence   Equipment: n/a   Reasons for discharge: Discharged from hospital   Patient/Family Agrees with Progress Made and Goals Achieved: Yes    Recardo DELENA Mole 08/18/2023, 2:15 PM

## 2023-08-18 NOTE — Patient Instructions (Signed)
 1) Strengthening: Chest Pull - Resisted   Hold Theraband in front of body with hands about shoulder width a part. Pull band a part and back together slowly. Repeat __10-15__ times. Complete ___1_ set(s) per session.. Repeat ___1_ session(s) per day.  http://orth.exer.us/926   Copyright  VHI. All rights reserved.   2) PNF Strengthening: Resisted   Standing with resistive band around each hand, bring right arm up and away, thumb back. Repeat _10-15___ times per set. Do __1__ sets per session. Do __1__ sessions per day.    3) Resisted External Rotation: in Neutral - Bilateral   Sit or stand, tubing in both hands, elbows at sides, bent to 90, forearms forward. Pinch shoulder blades together and rotate forearms out. Keep elbows at sides. Repeat _10-15___ times per set. Do __1__ sets per session. Do _1___ sessions per day.  http://orth.exer.us/966   Copyright  VHI. All rights reserved.   4) PNF Strengthening: Resisted   Standing, hold resistive band above head. Bring right arm down and out from side. Repeat _10-15___ times per set. Do __1__ sets per session. Do __1__ sessions per day.  http://orth.exer.us/922   Copyright  VHI. All rights reserved.     ELASTIC BAND TRICEPS EXTENSION - SELF FIXATION  While seated, hold and fixate one end of an elastic band against your chest. Hold the other end with your opposite hand with your elbow bent and arm by your side.   Start by pulling the band downward so that the elbow goes from a bent position to a straightened position as shown. Return to starting position and repeat.  Complete 10-15 repetitions, 1 set.     Theraband Elbow Flexion  Loop the middle of the band around one foot  With arms straight by side and palms facing forward, hold onto each end of the band Bend arms bringing hands toward shoulders, keeping elbows by your side Return to starting position . Complete 10-15 repetitions, 1 set.

## 2023-08-18 NOTE — Progress Notes (Addendum)
 Patient ID: Alicia Wilkerson, female   DOB: 1950-03-01, 73 y.o.   MRN: [id number]  Attempted to call daughter-[name] to return her call. Pt reports friend coming to transport home tomorrow. Await return call. According to MD has spoken with her sister-[name] yesterday and answered her questions  10:00 AM Met with pt who will have friend come to transport home and stay with her for a few days for the transition home from the hospital and make sure al goes well. Son from [facility] is not coming now and daughter has returned to [place], sister and other son is in [place]. Pt is aware she is capable of making her own decisions and feels capable of taking care of herself at home. No equipment needs and have made OP referral for therapies

## 2023-08-18 NOTE — Progress Notes (Signed)
 PROGRESS NOTE   Subjective/Complaints: Discussed plan for d/c home tomorrow, working on stairs with PT today, discussed jaw pain- she feels it is well controlled with tylenol      08/18/2023    5:52 AM 08/17/2023    9:13 PM 08/17/2023    1:29 PM  Vitals with BMI  Systolic 125 122 878  Diastolic 58 55 50  Pulse 59 65 64     ROS: Patient denies headache, or mood change. +jaw pain   Objective:   No results found. No results for input(s): WBC, HGB, HCT, PLT in the last 72 hours.  No results for input(s): NA, K, CL, CO2, GLUCOSE, BUN, CREATININE, CALCIUM  in the last 72 hours.   Intake/Output Summary (Last 24 hours) at 08/18/2023 0950 Last data filed at 08/17/2023 1818 Gross per 24 hour  Intake 480 ml  Output --  Net 480 ml        Physical Exam: Vital Signs Blood pressure (!) 125/58, pulse (!) 59, temperature 98.4 F (36.9 C), temperature source Oral, resp. rate 18, height 5' 3 (1.6 m), weight 54.8 kg, last menstrual period 02/24/2002, SpO2 99%.  General: Alert and oriented x 3, No apparent distress.  Laying in bed. HEENT: Head is normocephalic, atraumatic, PERRLA, EOMI, sclera anicteric, oral mucosa pink and moist, dentition intact,  Neck: Supple without JVD or lymphadenopathy Heart: Reg rate and rhythm. No murmurs rubs or gallops Chest: CTA bilaterally without wheezes, rales, or rhonchi; no distress Abdomen: Soft, non-tender, non-distended, bowel sounds positive. Extremities: No clubbing, cyanosis, or edema. Pulses are 2+ Psych: Pt's affect is appropriate. Pt is cooperative Skin: Clean and intact without signs of breakdown. A few abrasions  Neuro:  Alert and oriented x 3.  Normal insight and awareness.  + cognitive deficits - concentration, memory, and higher level processing - difficult to pick up on in normal conversation  Normal language and speech. Cranial nerve exam unremarkable.    5/5 right side, 4/5 left side  Sensory exam normal for light touch and pain in all 4 limbs. No limb ataxia or cerebellar signs. No abnormal tone appreciated. No pronator drift.    Musculoskeletal: Full ROM, No pain with AROM or PROM in the neck, trunk, or extremities. Posture appropriate     Assessment/Plan: 1. Functional deficits which require 3+ hours per day of interdisciplinary therapy in a comprehensive inpatient rehab setting. Physiatrist is providing close team supervision and 24 hour management of active medical problems listed below. Physiatrist and rehab team continue to assess barriers to discharge/monitor patient progress toward functional and medical goals  Care Tool:  Bathing    Body parts bathed by patient: Right arm, Left arm, Chest, Abdomen, Front perineal area, Buttocks, Face, Left lower leg, Right lower leg, Left upper leg, Right upper leg         Bathing assist Assist Level: Independent     Upper Body Dressing/Undressing Upper body dressing   What is the patient wearing?: Bra, Pull over shirt    Upper body assist Assist Level: Independent    Lower Body Dressing/Undressing Lower body dressing      What is the patient wearing?: Underwear/pull up, Pants  Lower body assist Assist for lower body dressing: Independent     Toileting Toileting    Toileting assist Assist for toileting: Independent     Transfers Chair/bed transfer  Transfers assist     Chair/bed transfer assist level: Independent     Locomotion Ambulation   Ambulation assist      Assist level: Supervision/Verbal cueing Assistive device: No Device Max distance: 1070ft   Walk 10 feet activity   Assist     Assist level: Supervision/Verbal cueing Assistive device: No Device   Walk 50 feet activity   Assist    Assist level: Supervision/Verbal cueing Assistive device: No Device    Walk 150 feet activity   Assist    Assist level: Supervision/Verbal  cueing Assistive device: No Device    Walk 10 feet on uneven surface  activity   Assist     Assist level: Contact Guard/Touching assist     Wheelchair     Assist Is the patient using a wheelchair?: No             Wheelchair 50 feet with 2 turns activity    Assist            Wheelchair 150 feet activity     Assist          Blood pressure (!) 125/58, pulse (!) 59, temperature 98.4 F (36.9 C), temperature source Oral, resp. rate 18, height 5' 3 (1.6 m), weight 54.8 kg, last menstrual period 02/24/2002, SpO2 99%.  Medical Problem List and Plan: 1. Functional deficits secondary to multiple ischemic infarcts right MCA/PCA territory.  Plan 30-day heart monitor at discharge             -patient may shower             -ELOS/Goals: 7 to 10 days, mod I goals PT/OT/SLP             -Stable for CIR therapies today including PT, OT, and SLP     - 6/22: Sons note cognitive changes and wandering gait starting several months prior to admission - concerned about prognosis of cognitive and functional status when returning home, as well as ongoing fatigue. Wish to discuss need for 24/7 assistance prior to teams Wednesday  - defer to primary medical/therapy team but per chart review seems reasonable for intermittent.   Called sister to discuss her concerns regarding her upcoming discharge  Encouraged patient to call PCP to make follow-up appointment  2.  Antithrombotics: -DVT/anticoagulation:  Pharmaceutical: continue Lovenox              -antiplatelet therapy: continue Aspirin  81 mg daily and Plavix  85 mg daily x 3 months then Plavix  alone  3. Jaw pain: continue Tylenol  650 mg as needed, benzocaine  for throat discomfort, discussed that this has been going on since he had her first stroke, discussed nerve calming medications but she feels that tylenol  provides enough relief  4. Hx of depression/Behavior/Sleep: continue Lexapro  10 mg nightly, melatonin 3 mg nightly as  needed, Klonopin  0.5 mg twice daily as needed anxiety             -antipsychotic agents: N/A  -mood appropriate today. Slept well  5. Neuropsych/cognition: This patient is capable of making decisions on her own behalf.             - Sister notes subtle cognitive decline prior to admission; ongoing difficulty with attention, with patient masks by redirecting conversation or concentrating on what she can do.  No  sundowning.    -6/20 SLP eval today. Pt seemed quite appropriate today on my eval.   6/22: Family notes increased fatigue and anxiety (ex. Got very frustrated and confused trying to use her phone apps last evening); discussed fatigue as common with strokes and avoiding overstimulation -> anxiety.    - Family additionally concerned with cognitive decline prior to stroke, findings of cererbral atrophy on imaging - discussed OP neurology/neuropsych for formal workup, they would like the pursue this, asked Rolan to please place referral  6. Skin/Wound Care: Routine skin checks 7. Fluids/Electrolytes/Nutrition: encourage PO  I personally reviewed the patient's labs today.    -albumin 2.8---protein supplementation with Boost  8.  Hyperlipidemia. continue Crestor   9.  UTI/sepsis.  IV Rocephin  completed.  10.  Episode of PSVT 08/11/2023.  Valsalva maneuvers and PSVT terminated within 3 seconds.  No plan for current beta-blocker due to resting heart rate in the 50s and low 60s..  11.  Hyperglycemia.  Initial glucose 307.  Hemoglobin A1c 3.7 not consistent with diabetes.  Hyperglycemia likely due to stress from CVA and UTI   -no cbg's needed  12.  CAD.  Low-dose aspirin  prior to admission.  13.  Hypotension/dizziness during OT this morning 6/20 - BP ok per OT 2 min after sx    -BP's in general have been in range over last few days    -monitor for any further sx            -encourage adequate fluids  6-21: Diastolic hypotension today; ordered a.m. orthostats--negative.  Encourage p.o.  fluids-with approximately 600 cc yesterday.  Add TED hose.  BP reviewed and slightly soft  14.  Constipation.  - Last bowel movement Smear 6/18  Continue Senokot S1 tab twice daily  Medium bowel movement 6/23  LOS: 5 days   Alicia Wilkerson 08/18/2023, 9:50 AM

## 2023-08-18 NOTE — Progress Notes (Signed)
 Physical Therapy Discharge Summary  Patient Details  Name: Alicia Wilkerson MRN: [id number] Date of Birth: 03-26-1950  Date of Discharge from PT service:August 18, 2023   Patient has met 8 of 8 long term goals due to improved activity tolerance, improved balance, improved postural control, increased strength, ability to compensate for deficits, improved attention, and improved awareness.  Patient to discharge at an ambulatory level Independent.   Reasons goals not met: n/a goals met.   Recommendation:  Patient will benefit from ongoing skilled PT services in outpatient setting to continue to advance safe functional mobility, address ongoing impairments in dynamic standing balance, dual-cog tasks, and minimize fall risk.  Equipment: No equipment provided  Reasons for discharge: treatment goals met and discharge from hospital  Patient/family agrees with progress made and goals achieved: Yes  PT Discharge Precautions/Restrictions Precautions Precautions: Fall Restrictions Weight Bearing Restrictions Per Provider Order: No Pain Pain Assessment Pain Scale: 0-10 Pain Score: 0-No pain Pain Interference Pain Interference Pain Effect on Sleep: 0. Does not apply - I have not had any pain or hurting in the past 5 days Pain Interference with Therapy Activities: 0. Does not apply - I have not received rehabilitationtherapy in the past 5 days Pain Interference with Day-to-Day Activities: 1. Rarely or not at all Vision/Perception  Vision - History Ability to See in Adequate Light: 0 Adequate Perception Perception: Within Functional Limits Praxis Praxis: WFL  Cognition Overall Cognitive Status: History of cognitive impairments - at baseline Arousal/Alertness: Awake/alert Orientation Level: Oriented X4 Year: 2025 Month: June Day of Week: Correct Attention: Alternating;Sustained;Selective Sustained Attention: Appears intact Selective Attention: Appears intact Alternating Attention:  Appears intact Memory: Impaired Memory Impairment: Decreased short term memory Decreased Short Term Memory: Verbal complex;Functional complex Awareness: Impaired Awareness Impairment: Anticipatory impairment Problem Solving: Impaired Problem Solving Impairment: Functional complex Executive Function: Self Monitoring;Self Correcting Organizing: Impaired Organizing Impairment: Functional complex Self Monitoring: Impaired Self Monitoring Impairment: Functional complex Safety/Judgment: Impaired Comments: eager to drive Sensation Sensation Light Touch: Appears Intact Hot/Cold: Appears Intact Proprioception: Appears Intact Stereognosis: Appears Intact Coordination Gross Motor Movements are Fluid and Coordinated: Yes Fine Motor Movements are Fluid and Coordinated: Yes Motor  Motor Motor: Hemiplegia;Motor apraxia Motor - Discharge Observations: motor planning improving, very mild left sided impairments  Mobility Bed Mobility Bed Mobility: Rolling Right;Rolling Left;Supine to Sit;Sit to Supine Rolling Right: Independent Rolling Left: Independent Supine to Sit: Independent Sit to Supine: Independent Transfers Transfers: Sit to Stand;Stand to [employer] Transfers Sit to Stand: Independent Stand to Sit: Independent Stand Pivot Transfers: Independent Transfer (Assistive device): None Locomotion  Gait Ambulation: Yes Gait Assistance: Independent Gait Distance (Feet): 500 Feet Assistive device: None Gait Gait: Yes Gait Pattern: Within Functional Limits Gait Pattern: Within Functional Limits Stairs / Additional Locomotion Stairs: Yes Stairs Assistance: Independent Stair Management Technique: Alternating pattern;Forwards;One rail Right Number of Stairs: 12 Height of Stairs: 6 Pick up small object from the floor assist level: Independent Wheelchair Mobility Wheelchair Mobility: No  Trunk/Postural Assessment  Cervical Assessment Cervical Assessment: Within Functional  Limits Thoracic Assessment Thoracic Assessment: Within Functional Limits Lumbar Assessment Lumbar Assessment: Within Functional Limits Postural Control Postural Control: Within Functional Limits  Balance Balance Balance Assessed: Yes Standardized Balance Assessment Standardized Balance Assessment: Berg Balance Test Berg Balance Test Sit to Stand: Able to stand without using hands and stabilize independently Standing Unsupported: Able to stand safely 2 minutes Sitting with Back Unsupported but Feet Supported on Floor or Stool: Able to sit safely and securely 2 minutes Stand to Sit:  Sits safely with minimal use of hands Transfers: Able to transfer safely, minor use of hands Standing Unsupported with Eyes Closed: Able to stand 10 seconds safely Standing Ubsupported with Feet Together: Able to place feet together independently and stand 1 minute safely From Standing, Reach Forward with Outstretched Arm: Can reach confidently >25 cm (10) From Standing Position, Pick up Object from Floor: Able to pick up shoe safely and easily From Standing Position, Turn to Look Behind Over each Shoulder: Looks behind from both sides and weight shifts well Turn 360 Degrees: Able to turn 360 degrees safely in 4 seconds or less Standing Unsupported, Alternately Place Feet on Step/Stool: Able to stand independently and safely and complete 8 steps in 20 seconds Standing Unsupported, One Foot in Front: Able to plae foot ahead of the other independently and hold 30 seconds Standing on One Leg: Able to lift leg independently and hold > 10 seconds Total Score: 55 Extremity Assessment      RLE Assessment RLE Assessment: Within Functional Limits LLE Assessment LLE Assessment: Within Functional Limits   Stefan Karen P Clyde Upshaw  PT, DPT, CSRS  08/18/2023, 2:46 PM

## 2023-08-18 NOTE — Progress Notes (Signed)
 Physical Therapy Session Note  Patient Details  Name: Alicia Wilkerson MRN: [id number] Date of Birth: 03/09/1950  Today's Date: 08/18/2023 PT Individual Time: 0230-0330 PT Individual Time Calculation (min): 60 min   Short Term Goals: Week 1:  PT Short Term Goal 1 (Week 1): STG = LTG due to ELOS  Skilled Therapeutic Interventions/Progress Updates:    Patient lying in bed on entrance to room. Patient alert and agreeable to PT session.   1st Session   Patient reported that she wanted to do steps to show her sister that she is doing well. Pt reports that she feels good about going home, and that she would have a friend stay with her the first whole day. Pt performed a series of balance outcome measures, performed steps, and a subjective questionnaire. Overall pt is still on track for estimated discharge date.   The Activities-specific Balance Confidence (ABC) Scale* Instructions to Participants: For each of the following activities, please indicate your level of confidence in doing the activity without losing your balance or becoming unsteady from choosing one of the percentage points on the scale from 0% to 100% If you do not currently do the activity in question, try and imagine how confident you would be if you had to do the activity. If you normally use a walking aid to do the activity or hold onto someone, rate your confidence as if you were using these supports.     No Confidence 0% 10 20 30  40 50 60 70 80 90 100% Completely Confident    How confident are you that you will not lose your balance or become unsteady when you. 1. .walk around the house? __80___% 2. .walk up or down stairs? ___100__% 3. .bend over and pick up a slipper from the front of a closet floor? __100___% 4. .reach for a small can off a shelf at eye level? __100___% 5. .stand on your tip toes and reach for something above your head? __100___% 6. .stand on a chair and reach for something? ___70__% 7. .sweep the floor?  __100___% 8. .walk outside the house to a car parked in the driveway? __100___% 9. .get into or out of a car? ___100__% 10. .walk across a parking lot to the mall? __100___% 11. .walk up or down a ramp? ___100__% 12. .walk in a crowded mall where people rapidly walk past you? __100___% 13. SABRAare bumped into by people as you walk through the mall? ___100__% 14. .step onto or off of an escalator while you are holding onto a railing? ___90__% 15. .step onto or off an escalator while holding onto parcels such that you cannot hold onto the railing? ___100__% 16. .walk outside on icy sidewalks? ___0__%   Alicia Wilkerson & Alicia Wilkerson. The Activities-specific Balance Confidence (ABC) Scale. Journal of Gerontology  Med Sci [phone]; 50(1):M28-34. Total ABC Score: ____1440______ Scoring: _________1440____ / 16 =  Total ABC Score: _______90___% of self confidence   Therapeutic Activity: Bed Mobility: Pt performed supine<>sit on EOB independently without cues.  Transfers: Pt performed sit<>stand and stand sit  transfers throughout session without a device and independently.   Stair Stepping  -10 steps without rails -10 steps holding laundry basket Gait Training:   Neuromuscular Re-ed: NMR facilitated during session with focus on dynamic balance and higher level gait.  -BERG Balance Test  NMR performed for improvements in motor control and coordination, balance, sequencing, judgement, and self confidence/ efficacy in performing all aspects of mobility at highest level of independence.  Patient in room at end of session with brakes locked, and all needs within reach.   2nd Session  Patient sitting on bed on entrance to room. Patient alert and agreeable to PT session.   Therapeutic Activity: Transfers: Pt performed all transfers independent.   -Stair Stepping with laundry basket - Pt performed supervision with no vc needed.  -Nustep - 62SPM x x .43 miles x 2.3 METS - Pt performed  supervision  Neuromuscular Re-ed: NMR facilitated during session with focus on fall prevention, step strategies, dual tasking, reaction time.  -Trust Fall Matrix - 3x10 (anterior, posterior, left, and right) -Single Leg Balance to lateral step 2x10  -Walking and Catching  -Walking and Catching dual tasking recall games (naming states, counting backwards, naming presidents)  Pt showed the ability to catch and walk while counting backwards quite well without stopping, when she needed to recall difficult answers she stopped for several seconds and needed vc to continue walking.   NMR performed for improvements in motor control and coordination, balance, sequencing, judgement, and self confidence/ efficacy in performing all aspects of mobility at highest level of independence.    Patient left in bed at end of session with brakes locked, and all needs within reach.   Therapy Documentation Precautions:  Precautions Precautions: Fall Restrictions Weight Bearing Restrictions Per Provider Order: No    Balance: Standardized Balance Assessment Standardized Balance Assessment: Berg Balance Test Berg Balance Test Sit to Stand: Able to stand without using hands and stabilize independently Standing Unsupported: Able to stand safely 2 minutes Sitting with Back Unsupported but Feet Supported on Floor or Stool: Able to sit safely and securely 2 minutes Stand to Sit: Sits safely with minimal use of hands Transfers: Able to transfer safely, minor use of hands Standing Unsupported with Eyes Closed: Able to stand 10 seconds safely Standing Ubsupported with Feet Together: Able to place feet together independently and stand 1 minute safely From Standing, Reach Forward with Outstretched Arm: Can reach confidently >25 cm (10) From Standing Position, Pick up Object from Floor: Able to pick up shoe safely and easily From Standing Position, Turn to Look Behind Over each Shoulder: Looks behind from both  sides and weight shifts well Turn 360 Degrees: Able to turn 360 degrees safely in 4 seconds or less Standing Unsupported, Alternately Place Feet on Step/Stool: Able to stand independently and safely and complete 8 steps in 20 seconds Standing Unsupported, One Foot in Front: Able to plae foot ahead of the other independently and hold 30 seconds Standing on One Leg: Able to lift leg independently and hold > 10 seconds Total Score: 55   Therapy/Group: Individual Therapy  Nickson Middlesworth 08/18/2023, 12:01 PM

## 2023-08-18 NOTE — Plan of Care (Signed)
  Problem: RH Problem Solving Goal: LTG Patient will demonstrate problem solving for (SLP) Description: LTG:  Patient will demonstrate problem solving for basic/complex daily situations with cues  (SLP) Outcome: Not Met (add Reason) emerging success    Problem: RH Memory Goal: LTG Patient will demonstrate ability for day to day (SLP) Description: LTG:   Patient will demonstrate ability for day to day recall/carryover during cognitive/linguistic activities with assist  (SLP) Outcome: Completed/Met   Problem: RH Awareness Goal: LTG: Patient will demonstrate awareness during functional activites type of (SLP) Description: LTG: Patient will demonstrate awareness during functional activites type of (SLP) Outcome: Completed/Met

## 2023-08-18 NOTE — Progress Notes (Incomplete)
 Inpatient Rehabilitation Discharge Medication Review by a Pharmacist  A complete drug regimen review was completed for this patient to identify any potential clinically significant medication issues.  High Risk Drug Classes Is patient taking? Indication by Medication  Antipsychotic No   Anticoagulant No   Antibiotic No   Opioid No   Antiplatelet Yes Aspirin , Plavix  (DAPT x 3 months, then Plavix  alone starting 11/10/2023): CVA ppx  Hypoglycemics/insulin No   Vasoactive Medication {Receiving?:26196} ?Losartan : ***  Chemotherapy No   Other Yes Tylenol : pain Crestor : HLD Lexapro , clonazepam : mood MagOx, fish oil: supplement      Type of Medication Issue Identified Description of Issue Recommendation(s)  Drug Interaction(s) (clinically significant)     Duplicate Therapy     Allergy     No Medication Administration End Date     Incorrect Dose     Additional Drug Therapy Needed     Significant med changes from prior encounter (inform family/care partners about these prior to discharge).  Restart or discontinue as appropriate. Communicate medication changes with patient/family at discharge  Other       Clinically significant medication issues were identified that warrant physician communication and completion of prescribed/recommended actions by midnight of the next day:  {Yes or No?:26198}  Name of provider notified for urgent issues identified: ***   Provider Method of Notification: ***    Pharmacist comments: ***   Time spent performing this drug regimen review (minutes): 30   Thank you for allowing pharmacy to be a part of this patient's care.   Bascom JAYSON Louder, PharmD 08/18/2023 12:00 AM    **Pharmacist phone directory can be found on [email or web address] listed under [facility]**

## 2023-08-19 ENCOUNTER — Other Ambulatory Visit (HOSPITAL_COMMUNITY): Payer: Self-pay

## 2023-08-19 DIAGNOSIS — H524 Presbyopia: Secondary | ICD-10-CM | POA: Diagnosis not present

## 2023-08-19 NOTE — Progress Notes (Signed)
 Inpatient Rehabilitation Discharge Medication Review by a Pharmacist  A complete drug regimen review was completed for this patient to identify any potential clinically significant medication issues.  High Risk Drug Classes Is patient taking? Indication by Medication  Antipsychotic No   Anticoagulant No   Antibiotic No   Opioid No   Antiplatelet Yes Aspirin , Plavix  (DAPT x 3 months, then Plavix  alone starting 11/10/2023): CVA ppx  Hypoglycemics/insulin No   Vasoactive Medication Yes Losartan : HTN  Chemotherapy No   Other Yes Tylenol : pain Crestor : HLD Lexapro , clonazepam : mood MagOx, fish oil: supplement      Type of Medication Issue Identified Description of Issue Recommendation(s)  Drug Interaction(s) (clinically significant)     Duplicate Therapy     Allergy     No Medication Administration End Date     Incorrect Dose     Additional Drug Therapy Needed     Significant med changes from prior encounter (inform family/care partners about these prior to discharge).  Restart or discontinue as appropriate. Communicate medication changes with patient/family at discharge  Other       Clinically significant medication issues were identified that warrant physician communication and completion of prescribed/recommended actions by midnight of the next day:  No  Name of provider notified for urgent issues identified:    Provider Method of Notification:    Pharmacist comments:    Time spent performing this drug regimen review (minutes): 30   Sergio Batch, PharmD, [place], AAHIVP, CPP Infectious Disease Pharmacist 08/19/2023 7:04 AM

## 2023-08-19 NOTE — Progress Notes (Signed)
 PROGRESS NOTE   Subjective/Complaints: No new complaints this morning Vitals stable except for bradycardia Friend is at bedside to help patient transition home     08/19/2023    6:49 AM 08/18/2023    7:34 PM 08/18/2023    2:12 PM  Vitals with BMI  Systolic 111 119 881  Diastolic 72 60 56  Pulse 55 63 67     ROS: Patient denies headache, or mood change. +jaw pain   Objective:   No results found. No results for input(s): WBC, HGB, HCT, PLT in the last 72 hours.  No results for input(s): NA, K, CL, CO2, GLUCOSE, BUN, CREATININE, CALCIUM  in the last 72 hours.   Intake/Output Summary (Last 24 hours) at 08/19/2023 0905 Last data filed at 08/18/2023 2137 Gross per 24 hour  Intake 480 ml  Output --  Net 480 ml        Physical Exam: Vital Signs Blood pressure 111/72, pulse (!) 55, temperature 97.7 F (36.5 C), temperature source Oral, resp. rate 18, height 5' 3 (1.6 m), weight 54.8 kg, last menstrual period 02/24/2002, SpO2 97%.  General: Alert and oriented x 3, No apparent distress.  Laying in bed. HEENT: Head is normocephalic, atraumatic, PERRLA, EOMI, sclera anicteric, oral mucosa pink and moist, dentition intact,  Neck: Supple without JVD or lymphadenopathy Heart: Reg rate and rhythm. No murmurs rubs or gallops Chest: CTA bilaterally without wheezes, rales, or rhonchi; no distress Abdomen: Soft, non-tender, non-distended, bowel sounds positive. Extremities: No clubbing, cyanosis, or edema. Pulses are 2+ Psych: Pt's affect is appropriate. Pt is cooperative Skin: Clean and intact without signs of breakdown. A few abrasions  Neuro:  Alert and oriented x 3.  Normal insight and awareness.  + cognitive deficits - concentration, memory, and higher level processing - difficult to pick up on in normal conversation  Normal language and speech. Cranial nerve exam unremarkable.   5/5 right side,  4/5 left side  Sensory exam normal for light touch and pain in all 4 limbs. No limb ataxia or cerebellar signs. No abnormal tone appreciated. No pronator drift.    Musculoskeletal: Full ROM, No pain with AROM or PROM in the neck, trunk, or extremities. Posture appropriate     Assessment/Plan: 1. Functional deficits which require 3+ hours per day of interdisciplinary therapy in a comprehensive inpatient rehab setting. Physiatrist is providing close team supervision and 24 hour management of active medical problems listed below. Physiatrist and rehab team continue to assess barriers to discharge/monitor patient progress toward functional and medical goals  Care Tool:  Bathing    Body parts bathed by patient: Right arm, Left arm, Chest, Abdomen, Front perineal area, Buttocks, Face, Left lower leg, Right lower leg, Left upper leg, Right upper leg         Bathing assist Assist Level: Independent     Upper Body Dressing/Undressing Upper body dressing   What is the patient wearing?: Bra, Pull over shirt    Upper body assist Assist Level: Independent    Lower Body Dressing/Undressing Lower body dressing      What is the patient wearing?: Underwear/pull up, Pants     Lower body assist Assist for  lower body dressing: Independent     Toileting Toileting    Toileting assist Assist for toileting: Independent     Transfers Chair/bed transfer  Transfers assist     Chair/bed transfer assist level: Independent     Locomotion Ambulation   Ambulation assist      Assist level: Independent Assistive device: No Device Max distance: 500'   Walk 10 feet activity   Assist     Assist level: Independent Assistive device: No Device   Walk 50 feet activity   Assist    Assist level: Independent Assistive device: No Device    Walk 150 feet activity   Assist    Assist level: Independent Assistive device: No Device    Walk 10 feet on uneven surface   activity   Assist     Assist level: Independent     Wheelchair     Assist Is the patient using a wheelchair?: No             Wheelchair 50 feet with 2 turns activity    Assist            Wheelchair 150 feet activity     Assist          Blood pressure 111/72, pulse (!) 55, temperature 97.7 F (36.5 C), temperature source Oral, resp. rate 18, height 5' 3 (1.6 m), weight 54.8 kg, last menstrual period 02/24/2002, SpO2 97%.  Medical Problem List and Plan: 1. Functional deficits secondary to multiple ischemic infarcts right MCA/PCA territory.  Plan 30-day heart monitor at discharge             -patient may shower             -ELOS/Goals: 7 to 10 days, mod I goals PT/OT/SLP             -d/c home   - 6/22: Sons note cognitive changes and wandering gait starting several months prior to admission - concerned about prognosis of cognitive and functional status when returning home, as well as ongoing fatigue. Wish to discuss need for 24/7 assistance prior to teams Wednesday  - defer to primary medical/therapy team but per chart review seems reasonable for intermittent.   Called sister to discuss her concerns regarding her upcoming discharge  Encouraged patient to call PCP to make follow-up appointment  2.  Antithrombotics: -DVT/anticoagulation:  Pharmaceutical: continue Lovenox              -antiplatelet therapy: continue Aspirin  81 mg daily and Plavix  85 mg daily x 3 months then Plavix  alone  3. Jaw pain: continue Tylenol  650 mg as needed, benzocaine  for throat discomfort, discussed that this has been going on since he had her first stroke, discussed nerve calming medications but she feels that tylenol  provides enough relief  4. Hx of depression/Behavior/Sleep: continue Lexapro  10 mg nightly, melatonin 3 mg nightly as needed, Klonopin  0.5 mg twice daily as needed anxiety             -antipsychotic agents: N/A  -mood appropriate today. Slept well  5.  Neuropsych/cognition: This patient is capable of making decisions on her own behalf.             - Sister notes subtle cognitive decline prior to admission; ongoing difficulty with attention, with patient masks by redirecting conversation or concentrating on what she can do.  No sundowning.    -6/20 SLP eval today. Pt seemed quite appropriate today on my eval.   6/22: Family notes increased  fatigue and anxiety (ex. Got very frustrated and confused trying to use her phone apps last evening); discussed fatigue as common with strokes and avoiding overstimulation -> anxiety.    - Family additionally concerned with cognitive decline prior to stroke, findings of cererbral atrophy on imaging - discussed OP neurology/neuropsych for formal workup, they would like the pursue this, asked Rolan to please place referral  6. Skin/Wound Care: Routine skin checks 7. Fluids/Electrolytes/Nutrition: encourage PO  I personally reviewed the patient's labs today.    -albumin 2.8---protein supplementation with Boost  8.  Hyperlipidemia. continue Crestor   9.  UTI/sepsis.  IV Rocephin  completed.  10.  Episode of PSVT 08/11/2023.  Valsalva maneuvers and PSVT terminated within 3 seconds.  No plan for current beta-blocker due to resting heart rate in the 50s and low 60s..  11.  Hyperglycemia.  Initial glucose 307.  Hemoglobin A1c 3.7 not consistent with diabetes.  Hyperglycemia likely due to stress from CVA and UTI   -no cbg's needed  12.  CAD.  Continue aspirin   13.  Hypotension/dizziness during OT this morning 6/20 - BP ok per OT 2 min after sx    -BP's in general have been in range over last few days    -monitor for any further sx            -encourage adequate fluids  6-21: Diastolic hypotension today; ordered a.m. orthostats--negative.  Encourage p.o. fluids-with approximately 600 cc yesterday.  Add TED hose.  BP reviewed and slightly soft  14.  Constipation.  - Last bowel movement Smear 6/18  Continue  Senokot S1 tab twice daily  Medium bowel movement 6/23  LOS: 6 days   Alicia Wilkerson 08/19/2023, 9:05 AM

## 2023-08-23 DIAGNOSIS — I639 Cerebral infarction, unspecified: Secondary | ICD-10-CM | POA: Diagnosis not present

## 2023-08-24 ENCOUNTER — Other Ambulatory Visit (HOSPITAL_COMMUNITY): Payer: Self-pay

## 2023-08-24 NOTE — Progress Notes (Unsigned)
 Subjective:    Patient ID: Alicia Wilkerson, female    DOB: 1950-03-24, 73 y.o.   MRN: [id number]     HPI Alicia Wilkerson is here for follow up from the hospital   6/15-presented to ED with confusion.  She was brought in by her daughter.  per her daughter she had been confused over the past 1-2 days.  Daughter lives in [place]  and first noted the patient to be confused on 6/14-last known well was 6/13.  On 6/14 daughter was face timing patient and she exhibited what she felt was a facial droop.  She also experienced a ground-level fall on that day-she was leaning forward to pick something up and tripped resulting in hitting her head on the floor without LOC.  She also fell out of bed later that day which was associated with no LOC.  Daughter arrived from [place]  and brought her to the emergency room.  Admitted for acute metabolic encephalopathy due to UTI.  WBC 16.7, glucose 309, UA positive for infection.  CT head/face without acute intracranial abnormality, no acute displaced facial fracture.  Chest x-ray negative.  CT head/angio-occlusion of right ICA just distal to the bifurcation, age indeterminate presumably acute given patient's symptoms.  Right ICA remains occluded to the terminus.  Revascularization of the right MCA via collateral flow across circle of Willis.  Fetal type origin of the right PCA with severe stenosis versus occlusion at the origin of the right PCOM.  Right PCA attenuated but patent distantly.  Mild atheromatous change from the left carotid bulb and left carotid siphon without hemodynamically significant stenosis.  Small area of return nodular tree-in-bud densities within left upper lobe, likely mild infection/small airway disease.  Aortic atherosclerosis.  MRI brain-patchy small volume acute ischemic nonhemorrhagic right PCA territory infarcts as above, few additional punctate infarcts in the right frontal lobe.  Underlying mildly advanced cerebral atrophy.  CT head  generalized cerebral atrophy with widening of the extra-axial spaces and ventricular dilation.  Patchy small volume acute right PCA territory infarct seen on earlier MR head not clearly visualized on current plain brain CT.  Mild left ethmoid sinus and mild sphenoid sinus mucosal thickening  Acute ischemic stroke: Imaging as above PT recommended CIR On Crestor  20 mg daily On aspirin , Plavix  Neurology consulted-recommended 30-day Holter monitor at discharge Aspirin  81 mg daily and Plavix  75 mg daily x 3 months then Plavix  alone If atrial fibrillation is diagnosed would recommend starting OAC likely Eliquis instead  Acute cystitis: Resolved as of 08/13/23 Urine culture growing less than 10,000 CFU Completed 3 days of IV Rocephin   PSVT: Had episode of PSVT with heart rate in 120s, narrow complex tachycardia He has to be terminated within 3 seconds with Valsalva maneuver Repleted with potassium and magnesium  Beta-blocker is not able to be started because of bradycardia, hypotension Try to keep potassium more than 4, magnesium  more than 2  Severe sepsis: Resolved as of 6/18 Urine was the source of the infection  Hyperglycemia: Resolved As of 6/19  Hypertension: Continue Cozaar  25 mg daily  ICAO, right: Seen on MRA and carotid US   No intervention needed  CAD, HLD: On asa, crestor    Charged to CIR 6/19-6/25   Follow-up with Dr. Lorilee Follow-up with Dr. Corina Follow-up with Dr. Onita on 10/29/2023  Medications and allergies reviewed with patient and updated if appropriate.  Current Outpatient Medications on File Prior to Visit  Medication Sig Dispense Refill   acetaminophen  (TYLENOL ) 325  MG tablet Take 2 tablets (650 mg total) by mouth every 6 (six) hours as needed for mild pain (pain score 1-3) (or Fever >/= 101).     aspirin  EC 81 MG tablet Take 1 tablet (81 mg total) by mouth daily. Swallow whole.     clonazePAM  (KLONOPIN ) 0.5 MG tablet Take 1 tablet (0.5 mg total) by  mouth 2 (two) times daily as needed (anxiety, sleep). 30 tablet 0   clopidogrel  (PLAVIX ) 75 MG tablet Take 1 tablet (75 mg total) by mouth daily. 30 tablet 0   escitalopram  (LEXAPRO ) 10 MG tablet Take 1 tablet (10 mg total) by mouth at bedtime. 30 tablet 0   losartan  (COZAAR ) 25 MG tablet Take 0.5 tablets (12.5 mg total) by mouth at bedtime. 30 tablet 0   magnesium  oxide (MAG-OX) 400 (240 Mg) MG tablet Take 1 tablet (400 mg total) by mouth daily. 30 tablet 0   Omega-3 Fatty Acids  (FISH OIL ) 600 MG CAPS Take 2 capsules by mouth at bedtime. 30 capsule 0   rosuvastatin  (CRESTOR ) 20 MG tablet Take 1 tablet (20 mg total) by mouth at bedtime. 30 tablet 0   No current facility-administered medications on file prior to visit.     Review of Systems     Objective:  There were no vitals filed for this visit. BP Readings from Last 3 Encounters:  08/19/23 111/72  08/13/23 113/79  08/09/23 (!) 141/69   Wt Readings from Last 3 Encounters:  08/15/23 120 lb 13 oz (54.8 kg)  08/13/23 128 lb 1.4 oz (58.1 kg)  08/09/23 122 lb (55.3 kg)   There is no height or weight on file to calculate BMI.    Physical Exam     Lab Results  Component Value Date   WBC 8.8 08/14/2023   HGB 11.7 (L) 08/14/2023   HCT 33.7 (L) 08/14/2023   PLT 306 08/14/2023   GLUCOSE 82 08/14/2023   CHOL 128 08/11/2023   TRIG 59 08/11/2023   HDL 52 08/11/2023   LDLCALC 64 08/11/2023   ALT 16 08/14/2023   AST 17 08/14/2023   NA 138 08/14/2023   K 4.3 08/14/2023   CL 105 08/14/2023   CREATININE 0.67 08/14/2023   BUN 12 08/14/2023   CO2 26 08/14/2023   TSH 1.594 08/10/2023   INR 1.0 08/09/2023   HGBA1C 3.7 (L) 08/10/2023     Assessment & Plan:    See Problem List for Assessment and Plan of chronic medical problems.

## 2023-08-25 ENCOUNTER — Encounter: Payer: Self-pay | Admitting: Internal Medicine

## 2023-08-25 ENCOUNTER — Ambulatory Visit (INDEPENDENT_AMBULATORY_CARE_PROVIDER_SITE_OTHER): Admitting: Internal Medicine

## 2023-08-25 VITALS — BP 110/78 | HR 63 | Temp 98.0°F | Ht 63.0 in | Wt 117.0 lb

## 2023-08-25 DIAGNOSIS — I6521 Occlusion and stenosis of right carotid artery: Secondary | ICD-10-CM

## 2023-08-25 DIAGNOSIS — F419 Anxiety disorder, unspecified: Secondary | ICD-10-CM

## 2023-08-25 DIAGNOSIS — E782 Mixed hyperlipidemia: Secondary | ICD-10-CM | POA: Diagnosis not present

## 2023-08-25 DIAGNOSIS — I779 Disorder of arteries and arterioles, unspecified: Secondary | ICD-10-CM | POA: Insufficient documentation

## 2023-08-25 DIAGNOSIS — I69319 Unspecified symptoms and signs involving cognitive functions following cerebral infarction: Secondary | ICD-10-CM

## 2023-08-25 DIAGNOSIS — I10 Essential (primary) hypertension: Secondary | ICD-10-CM | POA: Diagnosis not present

## 2023-08-25 NOTE — Patient Instructions (Addendum)
         Medications changes include :   None    Aspirin  81 mg daily and Plavix  75 mg daily x 3 months then Plavix  alone      Return in about 3 months (around 11/25/2023) for follow up.

## 2023-08-25 NOTE — Assessment & Plan Note (Addendum)
 Multiple infarcts noted on MRI brain 08/08/2023 Some residual cognitive deficits Outpatient PT, OT and speech appointment scheduled-advised trying to move these appointments up if possible so that she can start outpatient therapy sooner than later Has follow-up with physical medicine and rehab, neurology and cardiology 30-day Holter monitor to evaluate for A-fib-the day she went to the emergency room she did note several episodes of heart rate in the 170s on her watch which may have been atrial fibrillation Was not able to put the Holter monitor on, but our nurse was able to assist her and she is now wearing it Continue aspirin  81 mg daily and Plavix  75 mg daily for 3 months then Plavix  75 mg daily alone Continue Crestor  20 mg daily

## 2023-08-25 NOTE — Assessment & Plan Note (Signed)
 Chronic Fairly controlled Recent events have increased anxiety slightly which is understandable Continue Lexapro  10 mg daily

## 2023-08-25 NOTE — Assessment & Plan Note (Signed)
 Chronic Blood pressure well controlled Continue losartan  12.5 mg daily

## 2023-08-25 NOTE — Assessment & Plan Note (Signed)
 Chronic Lab Results  Component Value Date   LDLCALC 64 08/11/2023   Continue Crestor  20 mg daily

## 2023-08-25 NOTE — Assessment & Plan Note (Signed)
 On aspirin  81 mg daily, Plavix  75 mg daily and Crestor  20 mg daily

## 2023-09-04 ENCOUNTER — Encounter: Admitting: Registered Nurse

## 2023-09-09 ENCOUNTER — Ambulatory Visit (HOSPITAL_BASED_OUTPATIENT_CLINIC_OR_DEPARTMENT_OTHER): Admitting: Cardiology

## 2023-09-10 NOTE — Progress Notes (Unsigned)
 Subjective:    Patient ID: Alicia Wilkerson, female    DOB: Jun 30, 1950, 73 y.o.   MRN: [id number]  HPI: Alicia Wilkerson is a 73 y.o. female who is here or HFU appointment for follow up of her  Right Middle Cerebral Artery Stroke, Essential Hypertension and Hyperlipidemia. She presented to [facility] on 06/125/2025 with altered mental status.   Dr. Marcene: H&P  Alicia Wilkerson is a 73 y.o. female with medical history significant for essential pretension, hyperlipidemia, who is admitted to [facility] on 08/09/2023 with acute encephalopathy after presenting from home to [facility] ED complaining of altered mental status.    In setting of the patient's altered mental status, history is provided by patient, as well as her daughter, in addition to my discussions with the EDP ED chart review.   Daughter conveys that the patient has been altered, confused over the last 1 to 2 days.  Daughter, who lives in the state of ER, first noted the patient to be confused on Saturday, 08/08/2023, with last known well occurring on Friday, 08/07/2023.  On Saturday, 08/08/2023, daughter also felt, via FaceTime evaluation, the patient was exhibiting some evidence of facial droop.   The patient reportedly experienced a ground-level fall on Saturday, 08/08/2023, in which she was leaning forward to pick something up, and tripped, resulting in her hitting her head on the floor, without any associated loss of consciousness.  She is on daily baby aspirin  as an outpatient in the absence of any additional blood thinners at home.  Additionally, the patient reportedly fell out of her bed later on Saturday, 08/08/2023, which was also associated with no loss of consciousness.   No known history of diabetes .   Given ongoing confusion exhibited by the patient, daughter arrived from [place]  earlier today, and initially brought the patient to a local urgent care, before receiving recommendation to present to the ED for further evaluation  management of the above.     CT Head: WO Contrast:  Narrative & Impression  CLINICAL DATA:  Neuro deficit, acute, stroke suspected; Facial trauma, blunt   EXAM: CT HEAD WITHOUT CONTRAST   CT MAXILLOFACIAL WITHOUT CONTRAST   TECHNIQUE: Multidetector CT imaging of the head and maxillofacial structures were performed using the standard protocol without intravenous contrast. Multiplanar CT image reconstructions of the maxillofacial structures were also generated.   RADIATION DOSE REDUCTION: This exam was performed according to the departmental dose-optimization program which includes automated exposure control, adjustment of the mA and/or kV according to patient size and/or use of iterative reconstruction technique.   COMPARISON:  None Available.   FINDINGS: CT HEAD FINDINGS   Brain:   Atherosclerotic calcifications are present within the cavernous internal carotid arteries. No evidence of large-territorial acute infarction. No parenchymal hemorrhage. No mass lesion. No extra-axial collection.   No mass effect or midline shift. No hydrocephalus. Basilar cisterns are patent.   Vascular: No hyperdense vessel. Atherosclerotic calcifications are present within the cavernous internal carotid arteries.   Skull: No acute fracture or focal lesion.   Other: None.   CT MAXILLOFACIAL FINDINGS   Osseous: No fracture or mandibular dislocation. No destructive process.   Sinuses/Orbits: Right maxillary sinus mucosal thickening. Paranasal sinuses and mastoid air cells are clear. The orbits are unremarkable.   Soft tissues: Negative.   Visualized upper cervical spine: Degenerative changes.   IMPRESSION: 1. No acute intracranial abnormality. 2.  No acute displaced facial fracture.     CTA:  IMPRESSION: 1.  Occlusion of the right ICA just distal to the bifurcation, age indeterminate, but presumably acute given patient's symptoms. Right ICA remains occluded to the terminus.  Revascularization of the right MCA via collateral flow across the circle-of-Willis. 2. Fetal type origin of the right PCA with severe stenosis versus occlusion at the origin of the right PCOM. Right PCA attenuated but patent distally. 3. Mild atheromatous change about the left carotid bulb and left carotid siphon without hemodynamically significant stenosis. 4. Small area of reticulonodular tree-in-bud densities within the left upper lobe, likely mild infection/small airways disease. 5.  Aortic Atherosclerosis (ICD10-I70.0).   Critical Value/emergent results were called by telephone at the time of interpretation on 08/09/2023 at 10:40 pm to provider DAN FLOYD , who verbally acknowledged these results.    MR: Brain: IMPRESSION: 1. Patchy small volume acute ischemic nonhemorrhagic right PCA territory infarcts as above, with a few additional punctate infarcts within the right frontal lobe. 2. Underlying mildly advanced cerebral atrophy.  Neurology Consulted: Recommended: Low Dose Aspirin  and Plavix  x 3 months  then Plavix  alone   Alicia Wilkerson was admitted to inpatient rehabilitation on 08/13/2023 and discharged home on 08/19/2023. She is scheduled for Outpatient therapuy at [facility] on 09/23/2023.  She denies any pain . She rates her pain 0. Also reports she has a good appetite.   Pain Inventory Average Pain 0 Pain Right Now 0 My pain is no pain  LOCATION OF PAIN  no pain  BOWEL Number of stools per week: daily Oral laxative use No  Type of laxative n/a Enema or suppository use No  History of colostomy No  Incontinent No   BLADDER Normal In and out cath, frequency n/a Able to self cath No  Bladder incontinence No  Frequent urination No  Leakage with coughing No  Difficulty starting stream No  Incomplete bladder emptying No    Mobility walk without assistant Walks 10 minutes Ability to climb steps? Yes Do you Drive? No Do you have goals in this  area? yes retired Do you have any goals in this area?  yes  Neuro/Psych  N/a Prior Studies Any changes since last visit?  no N/a  Physicians involved in your care Primary care [facility]   Family History  Problem Relation Age of Onset   Osteoporosis Mother    Colon cancer Mother        deceased   Heart failure Father    Skin cancer Father    Hypertension Father    Heart disease Father    Heart disease Sister    Crohn's disease Daughter    Breast cancer Neg Hx    Social History   Socioeconomic History   Marital status: Widowed    Spouse name: Not on file   Number of children: 3   Years of education: Not on file   Highest education level: Not on file  Occupational History   Occupation: RETIRED  Tobacco Use   Smoking status: Never   Smokeless tobacco: Never  Vaping Use   Vaping status: Never Used  Substance and Sexual Activity   Alcohol use: Not Currently   Drug use: No   Sexual activity: Not Currently    Partners: Male    Birth control/protection: Surgical, Post-menopausal    Comment: BTL & hysterectomy  Other Topics Concern   Not on file  Social History Narrative   Lives alone. 2025   Social Drivers of Health   Financial Resource Strain: Low Risk  (05/01/2023)  Overall Financial Resource Strain (CARDIA)    Difficulty of Paying Living Expenses: Not very hard  Food Insecurity: No Food Insecurity (08/10/2023)   Hunger Vital Sign    Worried About Running Out of Food in the Last Year: Never true    Ran Out of Food in the Last Year: Never true  Transportation Needs: No Transportation Needs (08/10/2023)   PRAPARE - Administrator, Civil Service (Medical): No    Lack of Transportation (Non-Medical): No  Physical Activity: Insufficiently Active (05/01/2023)   Exercise Vital Sign    Days of Exercise per Week: 4 days    Minutes of Exercise per Session: 30 min  Stress: Stress Concern Present (05/01/2023)   [employer] of Occupational Health -  Occupational Stress Questionnaire    Feeling of Stress : To some extent  Social Connections: Moderately Isolated (08/10/2023)   Social Connection and Isolation Panel    Frequency of Communication with Friends and Family: More than three times a week    Frequency of Social Gatherings with Friends and Family: More than three times a week    Attends Religious Services: Never    Database administrator or Organizations: Yes    Attends Banker Meetings: 1 to 4 times per year    Marital Status: Widowed   Past Surgical History:  Procedure Laterality Date   ANKLE SURGERY  03/14/2015   BREAST BIOPSY Left 08/30/2021   CYSTIC APOCRINE METAPLASIA   CERVICAL CONIZATION W/BX N/A 12/13/2018   Procedure: CONIZATION CERVIX WITH BIOPSY;  Surgeon: Cleotilde Ronal RAMAN, MD;  Location: [facility] OR;  Service: Gynecology;  Laterality: N/A;   CYSTOSCOPY N/A 10/18/2019   Procedure: CYSTOSCOPY;  Surgeon: Cleotilde Ronal RAMAN, MD;  Location: [facility];  Service: Gynecology;  Laterality: N/A;   IR RADIOLOGY PERIPHERAL GUIDED IV START  01/05/2019   IR US  GUIDE VASC ACCESS RIGHT  01/05/2019   KNEE SURGERY Left 1991   per pt retained hardward   LEEP N/A 12/13/2018   Procedure: possible LOOP ELECTROSURGICAL EXCISION PROCEDURE (LEEP);  Surgeon: Cleotilde Ronal RAMAN, MD;  Location: [facility] OR;  Service: Gynecology;  Laterality: N/A;   ORIF ANKLE FRACTURE Left 03/14/2015   per pt retained hardware   ORIF WRIST FRACTURE Left 03/10/2022   Procedure: OPEN REDUCTION INTERNAL FIXATION (ORIF) WRIST FRACTURE;  Surgeon: Shari Easter, MD;  Location: [facility] OR;  Service: Orthopedics;  Laterality: Left;  regional with iv sedation   SPLENECTOMY, TOTAL  1954   due to spherocytosis   TOTAL LAPAROSCOPIC HYSTERECTOMY WITH SALPINGECTOMY N/A 10/18/2019   Procedure: TOTAL LAPAROSCOPIC HYSTERECTOMY WITH BILATERAL SALPINGECTOMY AND BILATERAL OOPHORECTOMY;  Surgeon: Cleotilde Ronal RAMAN, MD;  Location: [facility];  Service:  Gynecology;  Laterality: N/A;  BILATERAL SALPINGECTOMY POSS OOPHORECTOMY   TUBAL LIGATION Bilateral 1995   Past Medical History:  Diagnosis Date   Anemia    Anxiety disorder    Basal cell carcinoma (BCC) of left upper arm 12/2015   Basal cell carcinoma (BCC) of right lower leg 04/2016   Borderline hypertension    CAD (coronary artery disease)    a. nonobst by cor CT 12/2018.   CIN II (cervical intraepithelial neoplasia II)    CIN  1   Depression    Eczema    Family history of malignant neoplasm of gastrointestinal tract 08/14/2020   Family history of premature CAD    History of basal cell carcinoma (BCC) excision    left upper arm 2017;  right lower leg 2018;  bilateral lower leg and hip area 2019;   inner right lower leg, outer right thigh, & upper outside left arm 09/ 2020   History of cervical dysplasia 2013   History of hereditary spherocytosis    s/p splenectomy in 1954   History of hyperparathyroidism    per pt yrs ago was put on mega dose of vit d which caused the hyperparathyroid resolved after stopping vit d   History of pelvic fracture 2013   Left inguinal hernia 01/02/2023   MAI (mycobacterium avium-intracellulare) (HCC)    ? possibly by CT 12/2018   Mild hyperlipidemia    Osteoporosis    PONV (postoperative nausea and vomiting)    S/P hernia repair 03/02/2023   S/P splenectomy    spherocytosis   Wears glasses    White coat syndrome without diagnosis of hypertension    BP 99/68   Pulse 63   Resp 16   Ht 5' 3 (1.6 m)   Wt 116 lb 6.4 oz (52.8 kg)   LMP 02/24/2002   SpO2 98%   BMI 20.62 kg/m   Opioid Risk Score:   Fall Risk Score:  `1  Depression screen PHQ 2/9     09/11/2023    1:56 PM 08/25/2023    1:11 PM 05/01/2023    1:28 PM 02/24/2023    2:35 PM 09/04/2022    9:50 AM 04/02/2022    4:18 PM 11/19/2021    4:29 PM  Depression screen PHQ 2/9  Decreased Interest 0 0 0 0 0 0 0  Down, Depressed, Hopeless 0 0 0 0 0 0 0  PHQ - 2 Score 0 0 0 0 0 0 0   Altered sleeping 0 0 0      Tired, decreased energy 0 0 0      Change in appetite 0 0 0      Feeling bad or failure about yourself  0 0 0      Trouble concentrating 0 0 0      Moving slowly or fidgety/restless 0 0 0      Suicidal thoughts 0 0 0      PHQ-9 Score 0 0 0      Difficult doing work/chores Not difficult at all Not difficult at all Not difficult at all        Review of Systems  All other systems reviewed and are negative.      Objective:   Physical Exam Vitals and nursing note reviewed.  Constitutional:      Appearance: Normal appearance.  Cardiovascular:     Rate and Rhythm: Normal rate and regular rhythm.  Musculoskeletal:     Comments: Normal Muscle Bulk and Muscle Testing Reveals:  Upper Extremities: Full ROM and Muscle Strength  5/5 Lower Extremities : Full ROM and Muscle Strength 5/5 Arises from Table with ease Narrow Based Gait     Skin:    General: Skin is warm and dry.  Neurological:     Mental Status: She is alert and oriented to person, place, and time.  Psychiatric:        Mood and Affect: Mood normal.        Behavior: Behavior normal.           Assessment & Plan:  Right Middle Cerebral Artery Stroke: Continue current medication regimen. She has a scheduled appointment with [facility]. She has a scheduled appointment with neurology. Continue to Monitor. ,Essential Hypertension: Continue current medication regimen. PCP  following. Continue to Monitor  Hyperlipidemia. : Continue current medication regimen. Continue to Monitor.  F/U with Dr Lorilee in 4- 6 weeks

## 2023-09-11 ENCOUNTER — Encounter: Attending: Registered Nurse | Admitting: Registered Nurse

## 2023-09-11 ENCOUNTER — Encounter: Payer: Self-pay | Admitting: Registered Nurse

## 2023-09-11 VITALS — BP 99/68 | HR 63 | Resp 16 | Ht 63.0 in | Wt 116.4 lb

## 2023-09-11 DIAGNOSIS — I10 Essential (primary) hypertension: Secondary | ICD-10-CM | POA: Insufficient documentation

## 2023-09-11 DIAGNOSIS — I63511 Cerebral infarction due to unspecified occlusion or stenosis of right middle cerebral artery: Secondary | ICD-10-CM | POA: Insufficient documentation

## 2023-09-11 DIAGNOSIS — E7849 Other hyperlipidemia: Secondary | ICD-10-CM | POA: Diagnosis not present

## 2023-09-11 NOTE — Patient Instructions (Signed)
 Call your Primary Care Physician Regarding your Refills Plavix , any questions please call or send My-Chart Message.   [phone]

## 2023-09-14 ENCOUNTER — Other Ambulatory Visit: Payer: Self-pay

## 2023-09-14 MED ORDER — CLONAZEPAM 0.5 MG PO TABS: 0.5000 mg | ORAL_TABLET | Freq: Two times a day (BID) | ORAL | 4 refills | Status: AC | PRN

## 2023-09-14 MED ORDER — MAGNESIUM OXIDE -MG SUPPLEMENT 400 (240 MG) MG PO TABS: 400.0000 mg | ORAL_TABLET | Freq: Every day | ORAL | 5 refills | Status: DC

## 2023-09-14 MED ORDER — ESCITALOPRAM OXALATE 10 MG PO TABS: 10.0000 mg | ORAL_TABLET | Freq: Every day | ORAL | 5 refills | Status: AC

## 2023-09-14 MED ORDER — CLOPIDOGREL BISULFATE 75 MG PO TABS: 75.0000 mg | ORAL_TABLET | Freq: Every day | ORAL | 5 refills | Status: DC

## 2023-09-14 MED ORDER — CLOPIDOGREL BISULFATE 75 MG PO TABS: 75.0000 mg | ORAL_TABLET | Freq: Every day | ORAL | 0 refills | Status: DC

## 2023-09-14 MED ORDER — MAGNESIUM OXIDE -MG SUPPLEMENT 400 (240 MG) MG PO TABS: 400.0000 mg | ORAL_TABLET | Freq: Every day | ORAL | 0 refills | Status: DC

## 2023-09-15 ENCOUNTER — Telehealth: Payer: Self-pay | Admitting: Physical Medicine and Rehabilitation

## 2023-09-15 NOTE — Telephone Encounter (Signed)
 Patient needs refill on Plavix --tried to get it from pharmacy and they told her it was too soon to refill.  Please call patient.

## 2023-09-15 NOTE — Progress Notes (Signed)
 Urbano Albright, MD  Physician Physical Medicine and Rehabilitation   Consult Note    Addendum   Date of Service: 08/11/2023 12:20 PM  Related encounter: ED to Hosp-Admission (Discharged) from 08/09/2023 in [facility]   Expand All Collapse All           Physical Medicine and Rehabilitation Consult Reason for Consult:Rehab Referring Physician: Dr. Laurence     HPI: Alicia Wilkerson is a 73 y.o. female with past medical history of hypertension and hyperlipidemia who presented with altered mental status after being confused for 1 to 2 days.  Patient had a fall where she hit her head on the floor.  Patient had an MRI of her brain which showed acute ischemic right PCA territory infarcts with a few additional punctate infarcts within the right frontal lobe.  She is also found to have possible cystitis and was treated with ceftriaxone .  Patient was seen by neurology and she has been started on aspirin  and Plavix .  Pt reports episodes when her HR has been elevated.  Patient was seen by PT and OT and found to have functional deficits and intensive inpatient rehab recommended.   Chart reviewed indicates two-level home with 4 steps to enter.  Family available to assist 24/7 after discharge for temporary period. At baseline she lives independently.   Home: Home Living Family/patient expects to be discharged to:: Private residence Living Arrangements: Alone Available Help at Discharge: Family Type of Home: House Home Access: Stairs to enter Secretary/administrator of Steps: 4 Entrance Stairs-Rails: Right, Left (wide) Home Layout: Two level, Bed/bath upstairs Alternate Level Stairs-Number of Steps: flight Alternate Level Stairs-Rails: Left Bathroom Shower/Tub: Engineer, manufacturing systems: Standard Bathroom Accessibility: Yes Home Equipment: Agricultural consultant (2 wheels), [employer] - single point, BSC/3in1, Wheelchair - manual, Shower seat (maybe rshower seat, WC, BSC?  daughter will check)  Functional History: Prior Function Prior Level of Function : Independent/Modified Independent, Driving Functional Status:  Mobility: Bed Mobility Overal bed mobility: Needs Assistance Bed Mobility: Supine to Sit, Sit to Supine Supine to sit: Contact guard, HOB elevated Sit to supine: Contact guard assist Transfers Overall transfer level: Needs assistance Equipment used: None, 1 person hand held assist Transfers: Sit to/from Stand, Bed to chair/wheelchair/BSC Sit to Stand: Contact guard assist Bed to/from chair/wheelchair/BSC transfer type:: Step pivot Step pivot transfers: Contact guard assist, Min assist Ambulation/Gait Ambulation/Gait assistance: Min assist, Max assist, +2 safety/equipment Gait Distance (Feet): 75 Feet Assistive device: None, 1 person hand held assist Gait Pattern/deviations: Step-through pattern, Decreased stride length, Drifts right/left Gait velocity: decr   ADL: ADL Overall ADL's : Needs assistance/impaired Eating/Feeding: Set up, Sitting Grooming: Contact guard assist, Sitting Upper Body Bathing: Contact guard assist, Sitting Lower Body Bathing: Minimal assistance, Sit to/from stand Upper Body Dressing : Contact guard assist, Sitting Lower Body Dressing: Minimal assistance, Sit to/from stand Toilet Transfer: Minimal assistance, Moderate assistance, +2 for physical assistance, +2 for safety/equipment Functional mobility during ADLs: Minimal assistance, Moderate assistance, Maximal assistance, +2 for safety/equipment, +2 for physical assistance General ADL Comments: LOB with turning in hall requiring max A to correct   Cognition: Cognition Arousal: Alert Behavior During Therapy: WFL for tasks assessed/performed     Review of Systems  Constitutional:  Negative for chills and fever.  HENT:  Negative for congestion.        Toothache  Eyes:  Negative for blurred vision and double vision.  Respiratory:  Negative for shortness of  breath.   Cardiovascular:  Positive for palpitations. Negative for chest pain.  Gastrointestinal:  Negative for abdominal pain, diarrhea, nausea and vomiting.  Genitourinary:        + UTI  Musculoskeletal:  Negative for joint pain.  Skin:  Negative for rash.  Neurological:  Negative for tingling, sensory change, speech change, focal weakness and headaches.  Psychiatric/Behavioral:  The patient is nervous/anxious.         Past Medical History:  Diagnosis Date   Anemia     Anxiety disorder     Basal cell carcinoma (BCC) of left upper arm 12/2015   Basal cell carcinoma (BCC) of right lower leg 04/2016   Borderline hypertension     CAD (coronary artery disease)      a. nonobst by cor CT 12/2018.   CIN II (cervical intraepithelial neoplasia II)      CIN  1   Depression     Eczema     Family history of premature CAD     History of basal cell carcinoma (BCC) excision      left upper arm 2017;   right lower leg 2018;  bilateral lower leg and hip area 2019;   inner right lower leg, outer right thigh, & upper outside left arm 09/ 2020   History of cervical dysplasia 2013   History of hereditary spherocytosis      s/p splenectomy in 1954   History of hyperparathyroidism      per pt yrs ago was put on mega dose of vit d which caused the hyperparathyroid resolved after stopping vit d   History of pelvic fracture 2013   MAI (mycobacterium avium-intracellulare) (HCC)      ? possibly by CT 12/2018   Mild hyperlipidemia     Osteoporosis     PONV (postoperative nausea and vomiting)     S/P hernia repair 03/02/2023   S/P splenectomy      spherocytosis   Wears glasses     White coat syndrome without diagnosis of hypertension               Past Surgical History:  Procedure Laterality Date   ANKLE SURGERY   03/14/2015   BREAST BIOPSY Left 08/30/2021    CYSTIC APOCRINE METAPLASIA   CERVICAL CONIZATION W/BX N/A 12/13/2018    Procedure: CONIZATION CERVIX WITH BIOPSY;  Surgeon: Cleotilde Ronal RAMAN, MD;  Location: [facility] OR;  Service: Gynecology;  Laterality: N/A;   CYSTOSCOPY N/A 10/18/2019    Procedure: CYSTOSCOPY;  Surgeon: Cleotilde Ronal RAMAN, MD;  Location: [facility];  Service: Gynecology;  Laterality: N/A;   IR RADIOLOGY PERIPHERAL GUIDED IV START   01/05/2019   IR US  GUIDE VASC ACCESS RIGHT   01/05/2019   KNEE SURGERY Left 1991    per pt retained hardward   LEEP N/A 12/13/2018    Procedure: possible LOOP ELECTROSURGICAL EXCISION PROCEDURE (LEEP);  Surgeon: Cleotilde Ronal RAMAN, MD;  Location: [facility] OR;  Service: Gynecology;  Laterality: N/A;   ORIF ANKLE FRACTURE Left 03/14/2015    per pt retained hardware   ORIF WRIST FRACTURE Left 03/10/2022    Procedure: OPEN REDUCTION INTERNAL FIXATION (ORIF) WRIST FRACTURE;  Surgeon: Shari Easter, MD;  Location: [facility] OR;  Service: Orthopedics;  Laterality: Left;  regional with iv sedation   SPLENECTOMY, TOTAL   1954    due to spherocytosis   TOTAL LAPAROSCOPIC HYSTERECTOMY WITH SALPINGECTOMY N/A 10/18/2019    Procedure: TOTAL LAPAROSCOPIC HYSTERECTOMY WITH BILATERAL SALPINGECTOMY AND BILATERAL OOPHORECTOMY;  Surgeon: Cleotilde Ronal  S, MD;  Location: [facility];  Service: Gynecology;  Laterality: N/A;  BILATERAL SALPINGECTOMY POSS OOPHORECTOMY   TUBAL LIGATION Bilateral 1995             Family History  Problem Relation Age of Onset   Osteoporosis Mother     Colon cancer Mother          deceased   Heart failure Father     Skin cancer Father     Hypertension Father     Heart disease Father     Heart disease Sister     Crohn's disease Daughter     Breast cancer Neg Hx          Social History:  reports that she has never smoked. She has never used smokeless tobacco. She reports that she does not currently use alcohol. She reports that she does not use drugs. Allergies:  Allergies       Allergies  Allergen Reactions   Penicillins Rash      Did it involve swelling of the face/tongue/throat, SOB, or low BP?  No Did it involve sudden or severe rash/hives, skin peeling, or any reaction on the inside of your mouth or nose?  #  #  #  YES  #  #  #  Did you need to seek medical attention at a hospital or doctor's office? #  #  #  YES  #  #  #  When did it last happen?   years ago    If all above answers are NO, may proceed with cephalosporin use.     Sulfa Antibiotics Hives and Other (See Comments)   Nickel Hives   Penicillin G Other (See Comments)            Medications Prior to Admission  Medication Sig Dispense Refill   aspirin  EC 81 MG tablet Take 81 mg by mouth at bedtime.       clonazePAM  (KLONOPIN ) 0.5 MG tablet 1 tab by mouth once daily as needed 30 tablet 0   escitalopram  (LEXAPRO ) 10 MG tablet TAKE ONE TABLET BY MOUTH ONCE A DAY (Patient taking differently: Take 10 mg by mouth at bedtime.) 90 tablet 0   losartan  (COZAAR ) 25 MG tablet Take 1 tablet (25 mg total) by mouth daily. (Patient taking differently: Take 25 mg by mouth at bedtime.) 90 tablet 1   Omega-3 Fatty Acids  (FISH OIL ) 600 MG CAPS Take 1,200 capsules by mouth at bedtime.       rosuvastatin  (CRESTOR ) 20 MG tablet Take 1 tablet (20 mg total) by mouth daily. (Patient taking differently: Take 20 mg by mouth at bedtime.) 90 tablet 0            Blood pressure (!) 122/57, pulse 61, temperature 98 F (36.7 C), temperature source Oral, resp. rate 20, height 5' 3 (1.6 m), weight 56.1 kg, last menstrual period 02/24/2002, SpO2 95%. Physical Exam   General: No apparent distress HEENT: Head is normocephalic, atraumatic, sclera anicteric, oral mucosa pink and moist, wearing glasses Neck: Supple without JVD or lymphadenopathy Heart: Reg rate and rhythm. No murmurs rubs or gallops Chest: CTA bilaterally without wheezes, rales, or rhonchi; no distress Abdomen: Soft, non-tender, non-distended, bowel sounds positive. Extremities: No clubbing, cyanosis, or edema. Pulses are 2+ Psych: Pt's affect is appropriate. Pt is  cooperative Skin: Clean and intact without signs of breakdown Neuro:     Mental Status: AAOx4, fund of knowledge appropriate. Mildly altered attention.  Able to  spell world forwards, 1 error with world Backwards Speech/Languate: Naming and repetition intact, fluent, follows simple commands CRANIAL NERVES: II: PERRL. Visual fields full III, IV, VI: EOM intact, no gaze preference or deviation, right eye ptosis V: normal sensation bilaterally VII: no asymmetry VIII: normal hearing to speech IX, X: normal palatal elevation XI: 5/5 head turn and 5/5 shoulder shrug bilaterally XII: Tongue midline     MOTOR: RUE: 5/5 Deltoid, 5/5 Biceps, 5/5 Triceps,5/5 Grip LUE: 5/5 Deltoid, 5/5 Biceps, 5/5 Triceps, 5/5 Grip RLE: HF 5/5, KE 5/5, ADF 5/5, APF 5/5 LLE: HF 5/5, KE 5/5, ADF 5/5, APF 5/5     REFLEXES: No ankle clonus   SENSORY: Normal to touch all 4 extremities   Coordination: Normal finger to nose and heel to shin, no tremor       Lab Results Last 24 Hours       Results for orders placed or performed during the hospital encounter of 08/09/23 (from the past 24 hours)  Lipid panel     Status: None    Collection Time: 08/11/23  6:13 AM  Result Value Ref Range    Cholesterol 128 0 - 200 mg/dL    Triglycerides 59 <849 mg/dL    HDL 52 >59 mg/dL    Total CHOL/HDL Ratio 2.5 RATIO    VLDL 12 0 - 40 mg/dL    LDL Cholesterol 64 0 - 99 mg/dL  CBC     Status: Abnormal    Collection Time: 08/11/23  6:13 AM  Result Value Ref Range    WBC 11.6 (H) 4.0 - 10.5 K/uL    RBC 3.48 (L) 3.87 - 5.11 MIL/uL    Hemoglobin 12.0 12.0 - 15.0 g/dL    HCT 65.9 (L) 63.9 - 46.0 %    MCV 97.7 80.0 - 100.0 fL    MCH 34.5 (H) 26.0 - 34.0 pg    MCHC 35.3 30.0 - 36.0 g/dL    RDW 83.8 (H) 88.4 - 15.5 %    Platelets 346 150 - 400 K/uL    nRBC 0.0 0.0 - 0.2 %  Basic metabolic panel with GFR     Status: Abnormal    Collection Time: 08/11/23  6:13 AM  Result Value Ref Range    Sodium 135 135 - 145 mmol/L     Potassium 3.6 3.5 - 5.1 mmol/L    Chloride 101 98 - 111 mmol/L    CO2 25 22 - 32 mmol/L    Glucose, Bld 106 (H) 70 - 99 mg/dL    BUN 9 8 - 23 mg/dL    Creatinine, Ser 9.31 0.44 - 1.00 mg/dL    Calcium  8.7 (L) 8.9 - 10.3 mg/dL    GFR, Estimated >39 >39 mL/min    Anion gap 9 5 - 15       Imaging Results (Last 48 hours)  ECHOCARDIOGRAM COMPLETE Result Date: 08/11/2023    ECHOCARDIOGRAM REPORT   Patient Name:   Alicia Wilkerson Date of Exam: 08/11/2023 Medical Rec #:  [id number]       Height:       63.0 in Accession #:    [id number]      Weight:       123.7 lb Date of Birth:  02/15/1951       BSA:          1.576 m Patient Age:    73 years        BP:  129/79 mmHg Patient Gender: F               HR:           60 bpm. Exam Location:  Inpatient Procedure: 2D Echo, Cardiac Doppler and Color Doppler (Both Spectral and Color            Flow Doppler were utilized during procedure). Indications:    CVA  History:        Patient has prior history of Echocardiogram examinations, most                 recent 11/14/2018.  Sonographer:    Therisa Crouch Referring Phys: [id number] CORTNEY E DE LA TORRE IMPRESSIONS  1. Left ventricular ejection fraction, by estimation, is 60 to 65%. The left ventricle has normal function. The left ventricle has no regional wall motion abnormalities. There is mild left ventricular hypertrophy. Left ventricular diastolic parameters are consistent with Grade I diastolic dysfunction (impaired relaxation). Consider evaluation for infiltrative cardiomyopathy. (I.E HCM, amyloid)  2. Right ventricular systolic function is normal. The right ventricular size is normal. Mildly increased right ventricular wall thickness.  3. Left atrial size was moderately dilated.  4. Small to moderate sized pericardial effusion. No signs of tamponade.  5. The mitral valve is normal in structure. No evidence of mitral valve regurgitation. No evidence of mitral stenosis.  6. The aortic valve is normal in structure.  Aortic valve regurgitation is not visualized. No aortic stenosis is present.  7. The inferior vena cava is normal in size with greater than 50% respiratory variability, suggesting right atrial pressure of 3 mmHg. FINDINGS  Left Ventricle: Left ventricular ejection fraction, by estimation, is 60 to 65%. The left ventricle has normal function. The left ventricle has no regional wall motion abnormalities. The left ventricular internal cavity size was normal in size. There is  mild left ventricular hypertrophy. Left ventricular diastolic parameters are consistent with Grade I diastolic dysfunction (impaired relaxation). Right Ventricle: The right ventricular size is normal. Mildly increased right ventricular wall thickness. Right ventricular systolic function is normal. Left Atrium: Left atrial size was moderately dilated. Right Atrium: Right atrial size was normal in size. Pericardium: A moderately sized pericardial effusion is present. Mitral Valve: The mitral valve is normal in structure. No evidence of mitral valve regurgitation. No evidence of mitral valve stenosis. Tricuspid Valve: The tricuspid valve is normal in structure. Tricuspid valve regurgitation is not demonstrated. No evidence of tricuspid stenosis. Aortic Valve: The aortic valve is normal in structure. There is mild aortic valve annular calcification. Aortic valve regurgitation is not visualized. No aortic stenosis is present. Pulmonic Valve: The pulmonic valve was normal in structure. Pulmonic valve regurgitation is not visualized. No evidence of pulmonic stenosis. Aorta: The aortic root is normal in size and structure. Venous: The inferior vena cava is normal in size with greater than 50% respiratory variability, suggesting right atrial pressure of 3 mmHg. IAS/Shunts: No atrial level shunt detected by color flow Doppler.  LEFT VENTRICLE PLAX 2D LVIDd:         3.00 cm     Diastology LVIDs:         1.70 cm     LV e' medial:    4.35 cm/s LV PW:          1.10 cm     LV E/e' medial:  16.3 LV IVS:        1.30 cm     LV e' lateral:   5.55  cm/s LVOT diam:     1.90 cm     LV E/e' lateral: 12.8 LVOT Area:     2.84 cm  LV Volumes (MOD) LV vol d, MOD A2C: 38.2 ml LV vol d, MOD A4C: 37.5 ml LV vol s, MOD A2C: 13.2 ml LV vol s, MOD A4C: 12.0 ml LV SV MOD A2C:     25.0 ml LV SV MOD A4C:     37.5 ml LV SV MOD BP:      25.0 ml RIGHT VENTRICLE             IVC RV Basal diam:  2.80 cm     IVC diam: 0.90 cm RV S prime:     25.60 cm/s TAPSE (M-mode): 1.4 cm LEFT ATRIUM           Index LA diam:      3.90 cm 2.47 cm/m LA Vol (A4C): 65.8 ml 41.74 ml/m   AORTA Ao Root diam: 3.00 cm Ao Asc diam:  3.20 cm MITRAL VALVE               TRICUSPID VALVE MV Area (PHT): 2.90 cm    TR Peak grad:   19.5 mmHg MV Decel Time: 262 msec    TR Vmax:        221.00 cm/s MV E velocity: 71.10 cm/s MV A velocity: 71.80 cm/s  SHUNTS MV E/A ratio:  0.99        Systemic Diam: 1.90 cm Aditya Sabharwal Electronically signed by Ria Commander Signature Date/Time: 08/11/2023/9:19:13 AM    Final     CT HEAD WO CONTRAST ( ) Result Date: 08/10/2023 CLINICAL DATA:  Headache. EXAM: CT HEAD WITHOUT CONTRAST TECHNIQUE: Contiguous axial images were obtained from the base of the skull through the vertex without intravenous contrast. RADIATION DOSE REDUCTION: This exam was performed according to the departmental dose-optimization program which includes automated exposure control, adjustment of the mA and/or kV according to patient size and/or use of iterative reconstruction technique. COMPARISON:  August 09, 2023, MR head dated August 10, 2023 FINDINGS: Brain: There is generalized cerebral atrophy with widening of the extra-axial spaces and ventricular dilatation. There are areas of decreased attenuation within the white matter tracts of the supratentorial brain, consistent with microvascular disease changes. The patchy small volume acute right PCA territory infarct seen on the earlier MR head (August 10, 2023) is not  clearly visualized on the current plain brain CT. Vascular: Marked severity bilateral cavernous carotid artery calcification is noted. Skull: Normal. Negative for fracture or focal lesion. Sinuses/Orbits: There is mild left ethmoid sinus and mild sphenoid sinus mucosal thickening. Other: None. IMPRESSION: 1. Generalized cerebral atrophy with widening of the extra-axial spaces and ventricular dilatation. 2. The patchy small volume acute right PCA territory infarct seen on the earlier MR head is not clearly visualized on the current plain brain CT. 3. Mild left ethmoid sinus and mild sphenoid sinus mucosal thickening. Electronically Signed   By: Suzen Dials M.D.   On: 08/10/2023 18:28    MR BRAIN WO CONTRAST Result Date: 08/10/2023 CLINICAL DATA:  Initial evaluation for acute neuro deficit, stroke suspected. EXAM: MRI HEAD WITHOUT CONTRAST TECHNIQUE: Multiplanar, multiecho pulse sequences of the brain and surrounding structures were obtained without intravenous contrast. COMPARISON:  CTs from 08/09/2023 FINDINGS: Brain: Mildly advanced cerebral atrophy. No significant cerebral white matter disease for age. 1.2 cm acute ischemic nonhemorrhagic infarct present at the ventral right thalamic capsular region (series 5, image 75). Patchy small volume acute  ischemic nonhemorrhagic infarcts at the right occipital lobe, right PCA distribution (series 5, images 68, 70, 73). Mild patchy involvement of the mesial right temporal lobe/right hippocampus (series 5, images 72, 69). Few additional punctate acute ischemic nonhemorrhagic infarcts about the right frontal lobe (series 5, images 83, 80). No other evidence for acute or subacute infarct. Gray-white matter differentiation otherwise maintained. No acute or chronic intracranial blood products. No mass lesion, midline shift or mass effect. No hydrocephalus or extra-axial fluid collection. Prominent dural calcifications noted along the anterior falx. Pituitary gland  within normal limits. Vascular: Major intracranial vascular flow voids are maintained. Skull and upper cervical spine: Craniocervical junction within normal limits. Bone marrow signal intensity normal. No scalp soft tissue abnormality. Sinuses/Orbits: Globes orbital soft tissues within normal limits. Small right maxillary sinus retention cyst noted. Small volume pneumatized secretions noted within the left sphenoid sinus. No significant mastoid effusion. Other: None. IMPRESSION: 1. Patchy small volume acute ischemic nonhemorrhagic right PCA territory infarcts as above, with a few additional punctate infarcts within the right frontal lobe. 2. Underlying mildly advanced cerebral atrophy. Electronically Signed   By: Morene Hoard M.D.   On: 08/10/2023 02:39    CT ANGIO HEAD NECK W WO CM Result Date: 08/09/2023 CLINICAL DATA:  Initial evaluation for acute diplopia, Horner syndrome. EXAM: CT ANGIOGRAPHY HEAD AND NECK WITH AND WITHOUT CONTRAST TECHNIQUE: Multidetector CT imaging of the head and neck was performed using the standard protocol during bolus administration of intravenous contrast. Multiplanar CT image reconstructions and MIPs were obtained to evaluate the vascular anatomy. Carotid stenosis measurements (when applicable) are obtained utilizing NASCET criteria, using the distal internal carotid diameter as the denominator. RADIATION DOSE REDUCTION: This exam was performed according to the departmental dose-optimization program which includes automated exposure control, adjustment of the mA and/or kV according to patient size and/or use of iterative reconstruction technique. CONTRAST:  75mL OMNIPAQUE  IOHEXOL  350 MG/ML SOLN COMPARISON:  CT from earlier the same day. FINDINGS: CTA NECK FINDINGS Aortic arch: Visualized aortic arch within normal limits for caliber. Origin of the great vessels incompletely visualized on this exam. Mild aortic atherosclerosis. Right carotid system: Right CCA tortuous but  patent without stenosis. Atheromatous change about the right carotid bulb. There is occlusion of the right ICA just distal to the bifurcation (series 7, image 110). Right ICA remains occluded to the terminus. Left carotid system: Left common and internal carotid arteries are tortuous but patent without dissection. Mild atheromatous change about the left carotid bulb without hemodynamically significant stenosis. Vertebral arteries: Left vertebral artery hypoplastic and likely arises from the aortic arch, although the origin is not visualized. Atheromatous change at the origin of the dominant right vertebral artery with mild stenosis. Visualized vertebral arteries patent without stenosis or dissection. Skeleton: No worrisome osseous lesions. Moderate multilevel cervical spondylosis, most pronounced at C5-6 and C6-7. Other neck: No other acute finding. Upper chest: Small area of reticulonodular tree-in-bud densities within the left upper lobe, likely mild infection/small airways disease (series 7, image 138). No other acute finding. Review of the MIP images confirms the above findings CTA HEAD FINDINGS Anterior circulation: For atheromatous change about the left carotid siphon without hemodynamically significant stenosis. Right ICA remains occluded to the terminus. A1 segments patent bilaterally. Left A1 dominant. Normal anterior communicating artery complex. Anterior cerebral arteries patent without stenosis. Left M1 segment and distal left MCA branches are widely patent and well perfused. Revascularization of the right MCA via collateral flow across the circle-of-Willis. Right MCA patent and  perfused. Posterior circulation: Dominant right V4 segment patent without stenosis. Right PICA patent. Hypoplastic left vertebral artery largely terminates in PICA, although a tiny branch ascending towards the vertebrobasilar junction. Left PICA patent as well. Basilar patent without stenosis. Superior cerebral arteries patent  bilaterally. Left PCA supplied via a hypoplastic left P1 segment and prominent left posterior communicating artery. Left PCA patent to its distal aspect without significant stenosis. Fetal type origin of the right PCA with severe stenosis versus occlusion at the origin of the right PCOM (series 8, image 107). Right PCA attenuated but patent distally without visible stenosis. Venous sinuses: Patent allowing for timing the contrast bolus. Anatomic variants: As above.  No aneurysm. Review of the MIP images confirms the above findings IMPRESSION: 1. Occlusion of the right ICA just distal to the bifurcation, age indeterminate, but presumably acute given patient's symptoms. Right ICA remains occluded to the terminus. Revascularization of the right MCA via collateral flow across the circle-of-Willis. 2. Fetal type origin of the right PCA with severe stenosis versus occlusion at the origin of the right PCOM. Right PCA attenuated but patent distally. 3. Mild atheromatous change about the left carotid bulb and left carotid siphon without hemodynamically significant stenosis. 4. Small area of reticulonodular tree-in-bud densities within the left upper lobe, likely mild infection/small airways disease. 5.  Aortic Atherosclerosis (ICD10-I70.0). Critical Value/emergent results were called by telephone at the time of interpretation on 08/09/2023 at 10:40 pm to provider DAN FLOYD , who verbally acknowledged these results. Electronically Signed   By: Morene Hoard M.D.   On: 08/09/2023 22:43    DG Chest 2 View Result Date: 08/09/2023 EXAM: 2 VIEW(S) XRAY OF THE CHEST 08/09/2023 09:22:00 PM COMPARISON: 02/23/2022 CLINICAL HISTORY: r/o mass. AMS FINDINGS: LUNGS AND PLEURA: Mild lingular and right middle lobe scarring, chronic. Calcified granuloma in the left lower lung. No pulmonary edema. No pleural effusion. No pneumothorax. HEART AND MEDIASTINUM: No acute abnormality of the cardiac and mediastinal silhouettes. Thoracic  aortic atherosclerosis. BONES AND SOFT TISSUES: No acute osseous abnormality. IMPRESSION: 1. No acute findings. Electronically signed by: Pinkie Pebbles MD 08/09/2023 09:28 PM EDT RP Workstation: [id number]    CT HEAD WO CONTRAST Result Date: 08/09/2023 CLINICAL DATA:  Neuro deficit, acute, stroke suspected; Facial trauma, blunt EXAM: CT HEAD WITHOUT CONTRAST CT MAXILLOFACIAL WITHOUT CONTRAST TECHNIQUE: Multidetector CT imaging of the head and maxillofacial structures were performed using the standard protocol without intravenous contrast. Multiplanar CT image reconstructions of the maxillofacial structures were also generated. RADIATION DOSE REDUCTION: This exam was performed according to the departmental dose-optimization program which includes automated exposure control, adjustment of the mA and/or kV according to patient size and/or use of iterative reconstruction technique. COMPARISON:  None Available. FINDINGS: CT HEAD FINDINGS Brain: Atherosclerotic calcifications are present within the cavernous internal carotid arteries. No evidence of large-territorial acute infarction. No parenchymal hemorrhage. No mass lesion. No extra-axial collection. No mass effect or midline shift. No hydrocephalus. Basilar cisterns are patent. Vascular: No hyperdense vessel. Atherosclerotic calcifications are present within the cavernous internal carotid arteries. Skull: No acute fracture or focal lesion. Other: None. CT MAXILLOFACIAL FINDINGS Osseous: No fracture or mandibular dislocation. No destructive process. Sinuses/Orbits: Right maxillary sinus mucosal thickening. Paranasal sinuses and mastoid air cells are clear. The orbits are unremarkable. Soft tissues: Negative. Visualized upper cervical spine: Degenerative changes. IMPRESSION: 1. No acute intracranial abnormality. 2.  No acute displaced facial fracture. Electronically Signed   By: Morgane  Naveau M.D.   On: 08/09/2023 20:29    CT  Maxillofacial Wo Contrast Result  Date: 08/09/2023 CLINICAL DATA:  Neuro deficit, acute, stroke suspected; Facial trauma, blunt EXAM: CT HEAD WITHOUT CONTRAST CT MAXILLOFACIAL WITHOUT CONTRAST TECHNIQUE: Multidetector CT imaging of the head and maxillofacial structures were performed using the standard protocol without intravenous contrast. Multiplanar CT image reconstructions of the maxillofacial structures were also generated. RADIATION DOSE REDUCTION: This exam was performed according to the departmental dose-optimization program which includes automated exposure control, adjustment of the mA and/or kV according to patient size and/or use of iterative reconstruction technique. COMPARISON:  None Available. FINDINGS: CT HEAD FINDINGS Brain: Atherosclerotic calcifications are present within the cavernous internal carotid arteries. No evidence of large-territorial acute infarction. No parenchymal hemorrhage. No mass lesion. No extra-axial collection. No mass effect or midline shift. No hydrocephalus. Basilar cisterns are patent. Vascular: No hyperdense vessel. Atherosclerotic calcifications are present within the cavernous internal carotid arteries. Skull: No acute fracture or focal lesion. Other: None. CT MAXILLOFACIAL FINDINGS Osseous: No fracture or mandibular dislocation. No destructive process. Sinuses/Orbits: Right maxillary sinus mucosal thickening. Paranasal sinuses and mastoid air cells are clear. The orbits are unremarkable. Soft tissues: Negative. Visualized upper cervical spine: Degenerative changes. IMPRESSION: 1. No acute intracranial abnormality. 2.  No acute displaced facial fracture. Electronically Signed   By: Morgane  Naveau M.D.   On: 08/09/2023 20:29       Assessment/Plan: Diagnosis: Right PCA territory infarcts and right frontal lobe punctate infarcts Does the need for close, 24 hr/day medical supervision in concert with the patient's rehab needs make it unreasonable for this patient to be served in a less intensive  setting? Yes Co-Morbidities requiring supervision/potential complications:  - Acute cystitis, elevated bilirubin, hypertension, GAD, tachycardia, toothache Due to bladder management, bowel management, safety, skin/wound care, disease management, medication administration, pain management, and patient education, does the patient require 24 hr/day rehab nursing? Yes Does the patient require coordinated care of a physician, rehab nurse, therapy disciplines of PT, OT, SLP to address physical and functional deficits in the context of the above medical diagnosis(es)? Yes Addressing deficits in the following areas: balance, endurance, locomotion, strength, transferring, bowel/bladder control, bathing, dressing, feeding, grooming, toileting, cognition, speech, language, swallowing, and psychosocial support Can the patient actively participate in an intensive therapy program of at least 3 hrs of therapy per day at least 5 days per week? Yes The potential for patient to make measurable gains while on inpatient rehab is excellent Anticipated functional outcomes upon discharge from inpatient rehab are modified independent  with PT, modified independent with OT, modified independent with SLP. Estimated rehab length of stay to reach the above functional goals is: 5-7 days Anticipated discharge destination: Home Overall Rehab/Functional Prognosis: excellent   POST ACUTE RECOMMENDATIONS: This patient's condition is appropriate for continued rehabilitative care in the following setting: CIR Patient has agreed to participate in recommended program. Yes Note that insurance prior authorization may be required for reimbursement for recommended care.   Comment: Patient would be a candidate for CIR when medically stable.  Rehab coordinator to follow-up.       MEDICAL RECOMMENDATIONS: Patient appears to be having episodes of tachycardia. Possibly deconditioning.  Could consider checking orthostatic vital signs.   Neurology note from yesterday indicates irregular rhythm resembling A-fib and a rapid, narrow complex tachycardia May consider cardiology evaluation Consider Orajel for toothache     I have personally performed a face to face diagnostic evaluation of this patient. Additionally, I have examined the patient's medical record including any pertinent labs and radiographic images.  Thanks,   Murray Collier, MD 08/11/2023       Revision History  Routing History

## 2023-09-23 ENCOUNTER — Ambulatory Visit: Admitting: Occupational Therapy

## 2023-09-23 ENCOUNTER — Other Ambulatory Visit: Payer: Self-pay

## 2023-09-23 ENCOUNTER — Other Ambulatory Visit (HOSPITAL_COMMUNITY): Payer: Self-pay

## 2023-09-23 ENCOUNTER — Ambulatory Visit

## 2023-09-23 ENCOUNTER — Ambulatory Visit: Attending: Physical Medicine & Rehabilitation

## 2023-09-23 DIAGNOSIS — R2689 Other abnormalities of gait and mobility: Secondary | ICD-10-CM | POA: Insufficient documentation

## 2023-09-23 DIAGNOSIS — R41841 Cognitive communication deficit: Secondary | ICD-10-CM | POA: Insufficient documentation

## 2023-09-23 DIAGNOSIS — R29818 Other symptoms and signs involving the nervous system: Secondary | ICD-10-CM

## 2023-09-23 NOTE — Therapy (Signed)
 OUTPATIENT PHYSICAL THERAPY NEURO EVALUATION   Patient Name: Alicia Wilkerson MRN: [id number] DOB:30-Nov-1950, 73 y.o., female Today's Date: 09/23/2023   PCP: Geofm Hastings, MD REFERRING PROVIDER: Pegge Toribio PARAS, PA-C // Lorilee Sven SQUIBB, MD  END OF SESSION:  PT End of Session - 09/23/23 1438     Visit Number 1    Authorization Type [employer]    PT Start Time 1445    PT Stop Time 1530    PT Time Calculation (min) 45 min          Past Medical History:  Diagnosis Date   Anemia    Anxiety disorder    Basal cell carcinoma (BCC) of left upper arm 12/2015   Basal cell carcinoma (BCC) of right lower leg 04/2016   Borderline hypertension    CAD (coronary artery disease)    a. nonobst by cor CT 12/2018.   CIN II (cervical intraepithelial neoplasia II)    CIN  1   Depression    Eczema    Family history of malignant neoplasm of gastrointestinal tract 08/14/2020   Family history of premature CAD    History of basal cell carcinoma (BCC) excision    left upper arm 2017;   right lower leg 2018;  bilateral lower leg and hip area 2019;   inner right lower leg, outer right thigh, & upper outside left arm 09/ 2020   History of cervical dysplasia 2013   History of hereditary spherocytosis    s/p splenectomy in 1954   History of hyperparathyroidism    per pt yrs ago was put on mega dose of vit d which caused the hyperparathyroid resolved after stopping vit d   History of pelvic fracture 2013   Left inguinal hernia 01/02/2023   MAI (mycobacterium avium-intracellulare) (HCC)    ? possibly by CT 12/2018   Mild hyperlipidemia    Osteoporosis    PONV (postoperative nausea and vomiting)    S/P hernia repair 03/02/2023   S/P splenectomy    spherocytosis   Wears glasses    White coat syndrome without diagnosis of hypertension    Past Surgical History:  Procedure Laterality Date   ANKLE SURGERY  03/14/2015   BREAST BIOPSY Left 08/30/2021   CYSTIC APOCRINE METAPLASIA    CERVICAL CONIZATION W/BX N/A 12/13/2018   Procedure: CONIZATION CERVIX WITH BIOPSY;  Surgeon: Cleotilde Ronal RAMAN, MD;  Location: [facility] OR;  Service: Gynecology;  Laterality: N/A;   CYSTOSCOPY N/A 10/18/2019   Procedure: CYSTOSCOPY;  Surgeon: Cleotilde Ronal RAMAN, MD;  Location: [facility];  Service: Gynecology;  Laterality: N/A;   IR RADIOLOGY PERIPHERAL GUIDED IV START  01/05/2019   IR US  GUIDE VASC ACCESS RIGHT  01/05/2019   KNEE SURGERY Left 1991   per pt retained hardward   LEEP N/A 12/13/2018   Procedure: possible LOOP ELECTROSURGICAL EXCISION PROCEDURE (LEEP);  Surgeon: Cleotilde Ronal RAMAN, MD;  Location: [facility] OR;  Service: Gynecology;  Laterality: N/A;   ORIF ANKLE FRACTURE Left 03/14/2015   per pt retained hardware   ORIF WRIST FRACTURE Left 03/10/2022   Procedure: OPEN REDUCTION INTERNAL FIXATION (ORIF) WRIST FRACTURE;  Surgeon: Shari Easter, MD;  Location: [facility] OR;  Service: Orthopedics;  Laterality: Left;  regional with iv sedation   SPLENECTOMY, TOTAL  1954   due to spherocytosis   TOTAL LAPAROSCOPIC HYSTERECTOMY WITH SALPINGECTOMY N/A 10/18/2019   Procedure: TOTAL LAPAROSCOPIC HYSTERECTOMY WITH BILATERAL SALPINGECTOMY AND BILATERAL OOPHORECTOMY;  Surgeon: Cleotilde Ronal RAMAN, MD;  Location: [facility];  Service: Gynecology;  Laterality: N/A;  BILATERAL SALPINGECTOMY POSS OOPHORECTOMY   TUBAL LIGATION Bilateral 1995   Patient Active Problem List   Diagnosis Date Noted   Carotid artery disease (HCC) 08/25/2023   Essential (primary) hypertension 08/13/2023   Right middle cerebral artery stroke (HCC) 08/13/2023   ICAO (internal carotid artery occlusion), right 08/12/2023   PSVT (paroxysmal supraventricular tachycardia) (HCC) 08/11/2023   Residual cognitive deficit as late effect of cerebrovascular accident 08/09/2023   Lower back pain 01/02/2023   Vitamin D  deficiency 11/24/2022   Aortic atherosclerosis (HCC) 11/04/2019   HLD (hyperlipidemia) 11/03/2019   CAD (coronary  artery disease) 11/03/2019   S/P total hysterectomy and bilateral salpingo-oophorectomy, 2021 11/03/2019   Hearing loss 10/13/2017   Basal cell carcinoma (BCC) of skin of lower extremity including hip 05/19/2017   Anxiety 10/10/2016   Eczema 10/10/2016   H/O splenectomy for hemolytic spherocytosis 10/10/2016   H/O basal cell carcinoma excision 10/10/2016   History of cervical dysplasia 08/21/2016    ONSET DATE: 08/09/23  REFERRING DIAG:  P36.488 (ICD-10-CM) - Cerebral infarction due to unspecified occlusion or stenosis of right middle cerebral artery    THERAPY DIAG:  No diagnosis found.  Rationale for Evaluation and Treatment: Rehabilitation  SUBJECTIVE:                                                                                                                                                                                             SUBJECTIVE STATEMENT: If they didn't tell me I had a stroke I wouldn't know.  Feeling back to normal.  No falls or issues since the initial event. No visual deficits. Reports return to typical activities other than driving which has been restricted by medical staff recommendation. Pt accompanied by: self  PERTINENT HISTORY: medical history significant for essential pretension, hyperlipidemia, who is admitted to [facility] on 08/09/2023 with acute encephalopathy after presenting from home to [facility] ED complaining of altered mental status.  PAIN:  Are you having pain? No  PRECAUTIONS: None  RED FLAGS: None   WEIGHT BEARING RESTRICTIONS: No  FALLS: Has patient fallen in last 6 months? Has had a few falls related to tripping on items.   LIVING ENVIRONMENT: Lives with: lives alone Lives in: House/apartment Stairs:   Has following equipment at home: None  PLOF: Independent  PATIENT GOALS:  I feel fine, no issues  OBJECTIVE:  Note: Objective measures were completed at Evaluation unless otherwise noted.  DIAGNOSTIC FINDINGS: 1.  Patchy small volume acute ischemic nonhemorrhagic right PCA territory infarcts as above, with a few additional punctate infarcts within the right  frontal lobe. 2. Underlying mildly advanced cerebral atrophy.  COGNITION: Overall cognitive status: Within functional limits for tasks assessed   SENSATION: WFL  COORDINATION: WNL  EDEMA:  none  MUSCLE TONE: not tested  MUSCLE LENGTH: WNL  DTRs:  NT  POSTURE: No Significant postural limitations  LOWER EXTREMITY ROM:     Active  Right Eval Left Eval  Hip flexion    Hip extension    Hip abduction    Hip adduction    Hip internal rotation    Hip external rotation    Knee flexion    Knee extension    Ankle dorsiflexion 15 10  Ankle plantarflexion    Ankle inversion    Ankle eversion     (Blank rows = not tested)  LOWER EXTREMITY MMT:    MMT Right Eval Left Eval  Hip flexion 5 5  Hip extension    Hip abduction 5 5  Hip adduction 5 5  Hip internal rotation    Hip external rotation    Knee flexion 5 5  Knee extension 5 5  Ankle dorsiflexion 5 4  Ankle plantarflexion 5 5  Ankle inversion    Ankle eversion    (Blank rows = not tested)  BED MOBILITY:  Independent  TRANSFERS: Independent  RAMP:  Independent  CURB:  Independent  STAIRS: Independent GAIT: Findings: Comments: Independent, no deviations noted  FUNCTIONAL TESTS:  5 times sit to stand: 15 sec Dynamic Gait Index: Dynamic Gait Index  Mark the lowest level that applies.   Date Performed 09/22/23  Gait level surface (3) Normal: walks 20', no AD, good speed, no evidence for imbalance, normal gait pattern  2. Change in gait speed (3) Normal: Able to smoothly change walking speed without loss of balance or gait deviation. Shows a significant difference in walking speeds between normal, fast and slow speeds  3. Gait with horizontal head turns (3) Normal: Performs head turns smoothly with no change in gait  4. Gait with vertical head turns (3)  Normal: Performs head turns smoothly with no change in gait  5. Gait and pivot turn (3) Normal: Pivot turns safely within 3 seconds and stops quickly with no loss of balance  6. Step over obstacle (3) Normal: Is able to step over the box without changing gait speed, no evidence of imbalance  7. Step around obstacle (3) Normal: Is able to walk around cones safely without changing gait speed; no evidence of imbalance  8. Steps (3) Normal: Alternating feet, no rail  Total score 24/24    Score Interpretation: Score of <19 indicates high risk of falls.  Minimally Clinically Important Difference (MCID):  =DGI scores of<21/24 = 1.80 points DGI scores of >21/24 = 0.60 points   Houlton T, Inbar-Borovsky N, Brozgol M, Giladi N, [place] JM. The Dynamic Gait Index in healthy older adults: the role of stair climbing, fear of falling and gender. Gait Posture. 2009 Feb;29(2):237-41. doi: 10.1016/j.gaitpost.2008.08.013. Epub 2008 Oct 8. PMID: 81154560; PMCID: EFR7290501.  Pardasaney, MYRTIS LOIS Bonus, GEANNIE POUR., et al. (2012). Sensitivity to change and responsiveness of four balance measures for community-dwelling older adults. Physical therapy 92(3): 388-397.    M-CTSIB  Condition 1: Firm Surface, EO 30 Sec, Normal Sway  Condition 2: Firm Surface, EC 30 Sec, Normal Sway  Condition 3: Foam Surface, EO  Sec,  Sway  Condition 4: Foam Surface, EC  Sec,  Sway  TREATMENT DATE:     PATIENT EDUCATION: Education details: assessment details, no deficits or limitations identified.  Person educated: Patient Education method: Explanation Education comprehension: verbalized understanding  HOME EXERCISE PROGRAM:   GOALS: Goals reviewed with patient? No  SHORT TERM GOALS: Target date: N/A    ASSESSMENT:  CLINICAL IMPRESSION: Patient is a 72 y.o. lady who was seen today  for physical therapy evaluation and treatment for hx of CVA. Thankfully, she presents with no complaints and reports feeling back to her typical performance. Exhibits slight weakness to left ankle dorsiflexion and slight asymmetry to ROM but no deviations or foot clearance issues present during gait.  (-)Romberg Test, Dynamic Gait Index score 24/24 indicating low risk for falls, and demonstrates independent functional mobility and ambulation without need for AD and no impairment appreciated and even negotiating stairs with reciprocal fashion w/out need for HR.  No indication for PT services at this time and pt verbalizes agreement feeling confident in resuming her typical activities and responsibilities.   OBJECTIVE IMPAIRMENTS: none identified.   ACTIVITY LIMITATIONS: none noted  PARTICIPATION LIMITATIONS: N/A  PERSONAL FACTORS: Time since onset of injury/illness/exacerbation and 1 comorbidity: PMH are also affecting patient's functional outcome.   REHAB POTENTIAL: N/A  CLINICAL DECISION MAKING: Stable/uncomplicated  EVALUATION COMPLEXITY: Low  PLAN:  PT FREQUENCY: one time visit  PT DURATION:   PLANNED INTERVENTIONS: Evaluation only  PLAN FOR NEXT SESSION: N/A   4:46 PM, 09/23/23 M. Kelly Cristian Grieves, PT, DPT Physical Therapist- [place] Office Number: [phone]

## 2023-09-23 NOTE — Therapy (Signed)
 OUTPATIENT SPEECH LANGUAGE PATHOLOGY EVALUATION   Patient Name: Alicia Wilkerson MRN: [id number] DOB:09-Feb-1951, 72 y.o., female Today's Date: 09/23/2023  PCP: Alicia Hastings, MD REFERRING PROVIDER: Babs Hussar, MD  END OF SESSION:  End of Session - 09/23/23 1743     Visit Number 1    Number of Visits 1    Date for SLP Re-Evaluation 09/23/23    SLP Start Time 1403    SLP Stop Time  1442    SLP Time Calculation (min) 39 min    Activity Tolerance Patient tolerated treatment well          Past Medical History:  Diagnosis Date   Anemia    Anxiety disorder    Basal cell carcinoma (BCC) of left upper arm 12/2015   Basal cell carcinoma (BCC) of right lower leg 04/2016   Borderline hypertension    CAD (coronary artery disease)    a. nonobst by cor CT 12/2018.   CIN II (cervical intraepithelial neoplasia II)    CIN  1   Depression    Eczema    Family history of malignant neoplasm of gastrointestinal tract 08/14/2020   Family history of premature CAD    History of basal cell carcinoma (BCC) excision    left upper arm 2017;   right lower leg 2018;  bilateral lower leg and hip area 2019;   inner right lower leg, outer right thigh, & upper outside left arm 09/ 2020   History of cervical dysplasia 2013   History of hereditary spherocytosis    s/p splenectomy in 1954   History of hyperparathyroidism    per pt yrs ago was put on mega dose of vit d which caused the hyperparathyroid resolved after stopping vit d   History of pelvic fracture 2013   Left inguinal hernia 01/02/2023   MAI (mycobacterium avium-intracellulare) (HCC)    ? possibly by CT 12/2018   Mild hyperlipidemia    Osteoporosis    PONV (postoperative nausea and vomiting)    S/P hernia repair 03/02/2023   S/P splenectomy    spherocytosis   Wears glasses    White coat syndrome without diagnosis of hypertension    Past Surgical History:  Procedure Laterality Date   ANKLE SURGERY  03/14/2015   BREAST BIOPSY  Left 08/30/2021   CYSTIC APOCRINE METAPLASIA   CERVICAL CONIZATION W/BX N/A 12/13/2018   Procedure: CONIZATION CERVIX WITH BIOPSY;  Surgeon: Alicia Ronal RAMAN, MD;  Location: [facility] OR;  Service: Gynecology;  Laterality: N/A;   CYSTOSCOPY N/A 10/18/2019   Procedure: CYSTOSCOPY;  Surgeon: Alicia Ronal RAMAN, MD;  Location: [facility];  Service: Gynecology;  Laterality: N/A;   IR RADIOLOGY PERIPHERAL GUIDED IV START  01/05/2019   IR US  GUIDE VASC ACCESS RIGHT  01/05/2019   KNEE SURGERY Left 1991   per pt retained hardward   LEEP N/A 12/13/2018   Procedure: possible LOOP ELECTROSURGICAL EXCISION PROCEDURE (LEEP);  Surgeon: Alicia Ronal RAMAN, MD;  Location: [facility] OR;  Service: Gynecology;  Laterality: N/A;   ORIF ANKLE FRACTURE Left 03/14/2015   per pt retained hardware   ORIF WRIST FRACTURE Left 03/10/2022   Procedure: OPEN REDUCTION INTERNAL FIXATION (ORIF) WRIST FRACTURE;  Surgeon: Alicia Easter, MD;  Location: [facility] OR;  Service: Orthopedics;  Laterality: Left;  regional with iv sedation   SPLENECTOMY, TOTAL  1954   due to spherocytosis   TOTAL LAPAROSCOPIC HYSTERECTOMY WITH SALPINGECTOMY N/A 10/18/2019   Procedure: TOTAL LAPAROSCOPIC HYSTERECTOMY WITH BILATERAL SALPINGECTOMY AND BILATERAL OOPHORECTOMY;  Surgeon: Alicia Ronal RAMAN, MD;  Location: [facility];  Service: Gynecology;  Laterality: N/A;  BILATERAL SALPINGECTOMY POSS OOPHORECTOMY   TUBAL LIGATION Bilateral 1995   Patient Active Problem List   Diagnosis Date Noted   Carotid artery disease (HCC) 08/25/2023   Essential (primary) hypertension 08/13/2023   Right middle cerebral artery stroke (HCC) 08/13/2023   ICAO (internal carotid artery occlusion), right 08/12/2023   PSVT (paroxysmal supraventricular tachycardia) (HCC) 08/11/2023   Residual cognitive deficit as late effect of cerebrovascular accident 08/09/2023   Lower back pain 01/02/2023   Vitamin D  deficiency 11/24/2022   Aortic atherosclerosis (HCC) 11/04/2019    HLD (hyperlipidemia) 11/03/2019   CAD (coronary artery disease) 11/03/2019   S/P total hysterectomy and bilateral salpingo-oophorectomy, 2021 11/03/2019   Hearing loss 10/13/2017   Basal cell carcinoma (BCC) of skin of lower extremity including hip 05/19/2017   Anxiety 10/10/2016   Eczema 10/10/2016   H/O splenectomy for hemolytic spherocytosis 10/10/2016   H/O basal cell carcinoma excision 10/10/2016   History of cervical dysplasia 08/21/2016    ONSET DATE: 08/13/23   REFERRING DIAG: P36.488 (ICD-10-CM) - Cerebral infarction due to unspecified occlusion or stenosis of right middle cerebral artery  THERAPY DIAG:  Cognitive communication deficit   Rationale for Evaluation and Treatment: Rehabilitation  SUBJECTIVE:   SUBJECTIVE STATEMENT: I have the clear for day meds and the dark for night meds. (Pt, how she set up her medbox)  Pt accompanied by: self  PERTINENT HISTORY: PMH of HTN who presented to [facility] on 08/09/23 with 2 day history of AMS. Pt's daughter reports pt experienced a ground level fall on 6/14 in which she leaned forward to pick something up, fell and hit her head. No LOC. Endorsed a second fall later that day out of the bed. Imaging in the ED notable for occlusion of right ICA just distal to the bifurcation with revascularization of the R MCA via collateral flow across COW. CTA head/neck also showed R PCA with severe stenosis/occlusion with distal patency. MRI revealed multiple embolic appearing infarcts in the right MCA and PCA territories. ED workup also revealed UTI and tachycardia with intermittent rhythm regularity.    PAIN:  Are you having pain? No  FALLS: Has patient fallen in last 6 months?  Number of falls: 2-3  LIVING ENVIRONMENT: Lives with: lives alone Lives in: House/apartment  PLOF:  Level of assistance: Independent with ADLs, Independent with IADLs Employment: Retired  PATIENT GOALS: Here following hospital d/c from CIR  OBJECTIVE:  Note:  Objective measures were completed at Evaluation unless otherwise noted.  DIAGNOSTIC FINDINGS:  Clinical Impression/Discharge Summary (ST): 08/18/23 Pt has made good progress this stay despite minimal tx sessions completed d/t short LOS. She continues to demonstrate slight cognitive deficits during complex cognitive tasks. Deficits noted in the areas of attention to detail, organization, and complex problem solving. Functional cognition WFL. She varies from mod I to supervision at this time. Do not anticipate need for 24/7 supervision at this time. Pt education complete. She would benefit from continued ST upon d/c to maximize pt independence and facilitate return to PLOF.    Care Partner:  Caregiver Able to Provide Assistance: Yes  Type of Caregiver Assistance: Cognitive   Recommendation:  Outpatient SLP  Rationale for SLP Follow Up: Maximize cognitive function and independence    Equipment: n/a    Reasons for discharge: Discharged from hospital    COGNITION: Overall cognitive status: Within functional limits for tasks assessed Areas of impairment:  Executive function: Impaired: Comment: pt req'd min verbal cue for where to look for upcoming appointments in MyChart.  Functional deficits: Pt stated it took her longer to compete her bills but there were no errors. Medication and appointment management appear at baseline, per pt.  COGNITIVE COMMUNICATION: Following directions: Follows multi-step commands with increased time  Auditory comprehension: WFL Verbal expression: WFL Functional communication: WFL  ORAL MOTOR EXAMINATION: Overall status: WFL  STANDARDIZED ASSESSMENTS: [employer] Mental Status (SLUMS): 27/30  (WNL)  Orientation: 3/3 Delayed Recall w/ Interference: 4/5 Numeric Calculation and Registration: 3/3 Immediate Recall w/ Interference (Generative naming): 3/3 Registration and Digit Span: 2/2 Visual Spatial/Exec Functioning: 4/6 Executive  Functioning/Extrapolation: 8/8                                                                                                                            TREATMENT DATE: n/a   PATIENT EDUCATION: Education details: results of eval (pt was WNL), request re-evaluation if she notices something is different from baseline that she was not expecting to be different Person educated: Patient Education method: Explanation Education comprehension: verbalized understanding    ASSESSMENT:  CLINICAL IMPRESSION: Patient is a 73 y.o. F who was seen today for assessment of cognitive linguistics in light of CVA 08/13/23. Today pt scored WNL on SLUMS and does not report any errors on med management, billpay (even though this took longer than two months ago), and appointment management. She navigated to appointments in MyChart app with min A once (pt prefers to use paper calendar which she had in her pocketbook).  As of today, it does not appear pt will benefit from ST due to skills WFL/WNL. Pt was told if she notices something is uncomfortably different compared to prior to CVA she should request a referral and re-evaluation will be completed to see if ST is warranted at that time.  PLAN:  SLP FREQUENCY: one time visit   Kooper Godshall, CCC-SLP 09/23/2023, 5:45 PM

## 2023-09-23 NOTE — Therapy (Signed)
 OUTPATIENT OCCUPATIONAL THERAPY NEURO EVALUATION  Patient Name: Alicia Wilkerson MRN: [id number] DOB:08/04/1950, 74 y.o., female Today's Date: 09/23/2023  PCP: Geofm Hastings, JINNY, MD REFERRING PROVIDER: Babs Arthea DASEN, MD  END OF SESSION:  OT End of Session - 09/23/23 1344     Visit Number 1    Number of Visits 1    Authorization Type [employer] [id number]    OT Start Time 1324   pt arrival time   OT Stop Time 1404    OT Time Calculation (min) 40 min          Past Medical History:  Diagnosis Date   Anemia    Anxiety disorder    Basal cell carcinoma (BCC) of left upper arm 12/2015   Basal cell carcinoma (BCC) of right lower leg 04/2016   Borderline hypertension    CAD (coronary artery disease)    a. nonobst by cor CT 12/2018.   CIN II (cervical intraepithelial neoplasia II)    CIN  1   Depression    Eczema    Family history of malignant neoplasm of gastrointestinal tract 08/14/2020   Family history of premature CAD    History of basal cell carcinoma (BCC) excision    left upper arm 2017;   right lower leg 2018;  bilateral lower leg and hip area 2019;   inner right lower leg, outer right thigh, & upper outside left arm 09/ 2020   History of cervical dysplasia 2013   History of hereditary spherocytosis    s/p splenectomy in 1954   History of hyperparathyroidism    per pt yrs ago was put on mega dose of vit d which caused the hyperparathyroid resolved after stopping vit d   History of pelvic fracture 2013   Left inguinal hernia 01/02/2023   MAI (mycobacterium avium-intracellulare) (HCC)    ? possibly by CT 12/2018   Mild hyperlipidemia    Osteoporosis    PONV (postoperative nausea and vomiting)    S/P hernia repair 03/02/2023   S/P splenectomy    spherocytosis   Wears glasses    White coat syndrome without diagnosis of hypertension    Past Surgical History:  Procedure Laterality Date   ANKLE SURGERY  03/14/2015   BREAST BIOPSY Left 08/30/2021   CYSTIC APOCRINE  METAPLASIA   CERVICAL CONIZATION W/BX N/A 12/13/2018   Procedure: CONIZATION CERVIX WITH BIOPSY;  Surgeon: Cleotilde Ronal RAMAN, MD;  Location: [facility] OR;  Service: Gynecology;  Laterality: N/A;   CYSTOSCOPY N/A 10/18/2019   Procedure: CYSTOSCOPY;  Surgeon: Cleotilde Ronal RAMAN, MD;  Location: [facility];  Service: Gynecology;  Laterality: N/A;   IR RADIOLOGY PERIPHERAL GUIDED IV START  01/05/2019   IR US  GUIDE VASC ACCESS RIGHT  01/05/2019   KNEE SURGERY Left 1991   per pt retained hardward   LEEP N/A 12/13/2018   Procedure: possible LOOP ELECTROSURGICAL EXCISION PROCEDURE (LEEP);  Surgeon: Cleotilde Ronal RAMAN, MD;  Location: [facility] OR;  Service: Gynecology;  Laterality: N/A;   ORIF ANKLE FRACTURE Left 03/14/2015   per pt retained hardware   ORIF WRIST FRACTURE Left 03/10/2022   Procedure: OPEN REDUCTION INTERNAL FIXATION (ORIF) WRIST FRACTURE;  Surgeon: Shari Easter, MD;  Location: [facility] OR;  Service: Orthopedics;  Laterality: Left;  regional with iv sedation   SPLENECTOMY, TOTAL  1954   due to spherocytosis   TOTAL LAPAROSCOPIC HYSTERECTOMY WITH SALPINGECTOMY N/A 10/18/2019   Procedure: TOTAL LAPAROSCOPIC HYSTERECTOMY WITH BILATERAL SALPINGECTOMY AND BILATERAL OOPHORECTOMY;  Surgeon: Cleotilde Ronal  S, MD;  Location: [facility];  Service: Gynecology;  Laterality: N/A;  BILATERAL SALPINGECTOMY POSS OOPHORECTOMY   TUBAL LIGATION Bilateral 1995   Patient Active Problem List   Diagnosis Date Noted   Carotid artery disease (HCC) 08/25/2023   Essential (primary) hypertension 08/13/2023   Right middle cerebral artery stroke (HCC) 08/13/2023   ICAO (internal carotid artery occlusion), right 08/12/2023   PSVT (paroxysmal supraventricular tachycardia) (HCC) 08/11/2023   Residual cognitive deficit as late effect of cerebrovascular accident 08/09/2023   Lower back pain 01/02/2023   Vitamin D  deficiency 11/24/2022   Aortic atherosclerosis (HCC) 11/04/2019   HLD (hyperlipidemia) 11/03/2019    CAD (coronary artery disease) 11/03/2019   S/P total hysterectomy and bilateral salpingo-oophorectomy, 2021 11/03/2019   Hearing loss 10/13/2017   Basal cell carcinoma (BCC) of skin of lower extremity including hip 05/19/2017   Anxiety 10/10/2016   Eczema 10/10/2016   H/O splenectomy for hemolytic spherocytosis 10/10/2016   H/O basal cell carcinoma excision 10/10/2016   History of cervical dysplasia 08/21/2016    ONSET DATE: 08/09/23  REFERRING DIAG: P36.488 (ICD-10-CM) - Cerebral infarction due to unspecified occlusion or stenosis of right middle cerebral artery  THERAPY DIAG:  No diagnosis found.  Rationale for Evaluation and Treatment: Rehabilitation  SUBJECTIVE:   SUBJECTIVE STATEMENT: Pt reports that the biggest challenge is that she is not cleared to drive.  Pt reports that she has been having to arrange transportation with [employer] and [employer] and ordering [employer] for meals and groceries.  Pt reports initially that her focus was a little off, reporting foggy head but reports that she is now much better at sequential planning and balancing her checkbook.   Pt accompanied by: self  PERTINENT HISTORY: 73 y.o. female presented to hospital 08/09/23 with AMS and multiple recent falls. MRI brain with patchy small volume acute right PCA territory infarcts, with a few additional punctate infarcts within the right frontal lobe. PMH significant for HTN, HLD, osteoporosis, cervical intraepithelial neoplasia II.  PRECAUTIONS: Other: not cleared to drive  WEIGHT BEARING RESTRICTIONS: No  PAIN:  Are you having pain? No  FALLS: Has patient fallen in last 6 months? Yes. Number of falls 3 around the time of the stroke  LIVING ENVIRONMENT: Lives with: lives alone Lives in: House/apartment Stairs: Yes: 3 steps to enter, rail on L when going up; 11-13 to 2nd floor with bedroom/full bathroom. Has following equipment at home: Single point cane, Walker - 2 wheeled, and Shower bench - not currently  using any  PLOF: Independent, Independent with basic ADLs, and Independent with household mobility without device  PATIENT GOALS: to return to driving  OBJECTIVE:  Note: Objective measures were completed at Evaluation unless otherwise noted.  HAND DOMINANCE: Right  ADLs: Overall ADLs: Mod I - Independent Transfers/ambulation related to ADLs: not using any AD/DME Tub Shower transfers: Mod I - Independent, stepping over tub ledge Equipment: Transfer tub bench  IADLs: Shopping: using [employer] to deliver groceries Light housekeeping: Mod I - Independent Meal Prep: Mod I - Independent, no issues with sequencing of meal prep Community mobility: using Uber/Ly[employer] for transportation as not cleared to return to driving. Medication management: Mod I Financial management: Mod I, reporting improved sequencing with balancing checkbook and bill pay Handwriting: no changes  MOBILITY STATUS: Independent  POSTURE COMMENTS:  No Significant postural limitations  ACTIVITY TOLERANCE: Activity tolerance: WFL  FUNCTIONAL OUTCOME MEASURES: Pill box assessment: completed in 6:55 due to splitting pill box near finishing and needing to double  back to ensure correct fill.  Pt with no errors and demonstrating good sequencing and checks/balances to ensure reduced errors.  The Pillbox Test: A Measure of Executive Functioning and Estimate of Medication Management. A straight pass/fail designation is determined by 3 or more errors of omission or misplacement on the task. The pt completed the test with 0 errors.   UPPER EXTREMITY ROM:  WFL bilaterally  UPPER EXTREMITY MMT:   grossly 4+ to 5 overall  COORDINATION: Finger Nose Finger test: [facility] 9 Hole Peg test: Right: 30.78 sec; Left: 32.56 sec  SENSATION: WFL  COGNITION: Overall cognitive status: Within functional limits for tasks assessed  5 word recall: apple, pen, tie, house, car Apple, pen, tie, car, house after 5 mins    VISION: Subjective  report: no changes Baseline vision: Wears contacts Visual history: cataracts  VISION ASSESSMENT: Ocular ROM: WFL Gaze preference/alignment: WDL Tracking/Visual pursuits: Able to track stimulus in all quads without difficulty Saccades: WFL Convergence: WFL Visual Fields: no apparent deficits                                                                                                                             TREATMENT DATE:  09/23/23 Educated on difference between physiatrist and neurologist in regards to post stroke care. Reviewed typical return to driving recommendations and encouraged pt to f/u with PM&R at next visit to pursue safe return to driving.     PATIENT EDUCATION: Education details: Educated on role and purpose of OT as well as potential interventions and goals for therapy based on initial evaluation findings. Person educated: Patient Education method: Explanation Education comprehension: verbalized understanding  HOME EXERCISE PROGRAM: TBD   GOALS: Goals reviewed with patient? Yes  SHORT TERM GOALS: Target date: 09/23/23  Pt will verbalize understanding of safe return to driving recommendations and clearance for return to driving. Baseline: Goal status: MET   ASSESSMENT:  CLINICAL IMPRESSION: Patient is a 73 y.o. female who was seen today for occupational therapy evaluation for impairments s/p CVA. Pt reports that she is back to baseline with ADLs/IADLs and sequencing during IADLs (such as meal prep, medication management, and financial management).  Pt's main concern in when she will be cleared to return to driving.  As pt lives alone, she has been having to arrange transportation and grocery delivery.  Pt with no further skilled occupational therapy needs at this time.  PERFORMANCE DEFICITS: in functional skills including IADLs and decreased knowledge of precautions - safe return to driving.  IMPAIRMENTS: are limiting patient from IADLs.    CO-MORBIDITIES: may have co-morbidities  that affects occupational performance. Patient will benefit from skilled OT to address above impairments and improve overall function.  MODIFICATION OR ASSISTANCE TO COMPLETE EVALUATION: No modification of tasks or assist necessary to complete an evaluation.  OT OCCUPATIONAL PROFILE AND HISTORY: Problem focused assessment: Including review of records relating to presenting problem.  CLINICAL DECISION MAKING: LOW - limited treatment options, no task  modification necessary  REHAB POTENTIAL: Excellent  EVALUATION COMPLEXITY: Low    PLAN:  OT FREQUENCY: one time visit  OT DURATION: other: eval only  PLANNED INTERVENTIONS: 97168 OT Re-evaluation and [id number] self care/ADL training  RECOMMENDED OTHER SERVICES: NA  CONSULTED AND AGREED WITH PLAN OF CARE: Patient  PLAN FOR NEXT SESSION: NA, eval only   Paige Monarrez, OTR/L 09/23/2023, 4:06 PM  [facility] at [facility] [address] [place], [place] [address] Phone # [phone] Fax # [phone]

## 2023-09-24 ENCOUNTER — Ambulatory Visit: Attending: Cardiology

## 2023-09-24 DIAGNOSIS — I639 Cerebral infarction, unspecified: Secondary | ICD-10-CM

## 2023-10-08 ENCOUNTER — Emergency Department (HOSPITAL_BASED_OUTPATIENT_CLINIC_OR_DEPARTMENT_OTHER)

## 2023-10-08 ENCOUNTER — Ambulatory Visit: Payer: Self-pay | Admitting: *Deleted

## 2023-10-08 ENCOUNTER — Emergency Department (HOSPITAL_BASED_OUTPATIENT_CLINIC_OR_DEPARTMENT_OTHER)
Admission: EM | Admit: 2023-10-08 | Discharge: 2023-10-08 | Disposition: A | Discharge status: Home / Self Care | Source: Ambulatory Visit | Attending: Emergency Medicine | Admitting: Emergency Medicine

## 2023-10-08 ENCOUNTER — Other Ambulatory Visit: Payer: Self-pay

## 2023-10-08 DIAGNOSIS — S00531A Contusion of lip, initial encounter: Secondary | ICD-10-CM | POA: Diagnosis not present

## 2023-10-08 DIAGNOSIS — Z7902 Long term (current) use of antithrombotics/antiplatelets: Secondary | ICD-10-CM | POA: Diagnosis not present

## 2023-10-08 DIAGNOSIS — S82001A Unspecified fracture of right patella, initial encounter for closed fracture: Secondary | ICD-10-CM | POA: Diagnosis not present

## 2023-10-08 DIAGNOSIS — I2510 Atherosclerotic heart disease of native coronary artery without angina pectoris: Secondary | ICD-10-CM | POA: Insufficient documentation

## 2023-10-08 DIAGNOSIS — W010XXA Fall on same level from slipping, tripping and stumbling without subsequent striking against object, initial encounter: Secondary | ICD-10-CM | POA: Insufficient documentation

## 2023-10-08 DIAGNOSIS — M25561 Pain in right knee: Secondary | ICD-10-CM | POA: Insufficient documentation

## 2023-10-08 DIAGNOSIS — S82031A Displaced transverse fracture of right patella, initial encounter for closed fracture: Secondary | ICD-10-CM | POA: Diagnosis not present

## 2023-10-08 DIAGNOSIS — W19XXXA Unspecified fall, initial encounter: Secondary | ICD-10-CM

## 2023-10-08 DIAGNOSIS — Z85828 Personal history of other malignant neoplasm of skin: Secondary | ICD-10-CM | POA: Insufficient documentation

## 2023-10-08 DIAGNOSIS — Z7982 Long term (current) use of aspirin: Secondary | ICD-10-CM | POA: Insufficient documentation

## 2023-10-08 DIAGNOSIS — S8991XA Unspecified injury of right lower leg, initial encounter: Secondary | ICD-10-CM | POA: Diagnosis not present

## 2023-10-08 MED ORDER — OXYCODONE HCL 5 MG PO CAPS: 5.0000 mg | ORAL_CAPSULE | Freq: Three times a day (TID) | ORAL | 0 refills | Status: DC | PRN

## 2023-10-08 NOTE — Telephone Encounter (Signed)
 Patient being seen at ER right now.

## 2023-10-08 NOTE — ED Provider Notes (Signed)
 [facility] Provider Note   CSN: [id number] Arrival date & time: 10/08/23  1442     Patient presents with: No chief complaint on file.   Alicia Wilkerson is a 73 y.o. female.   The history is provided by the patient and a relative.  Patient presents after fall.  Has had previous stroke.  Clemens going up her driveway 2 days ago.  Did hit her face and her right knee.  Now difficulty walking due to the right knee pain and swelling.  Does have superficial laceration that appears well-healed.  Happened 2 days ago and has been at her mental baseline since.    Past Medical History:  Diagnosis Date   Anemia    Anxiety disorder    Basal cell carcinoma (BCC) of left upper arm 12/2015   Basal cell carcinoma (BCC) of right lower leg 04/2016   Borderline hypertension    CAD (coronary artery disease)    a. nonobst by cor CT 12/2018.   CIN II (cervical intraepithelial neoplasia II)    CIN  1   Depression    Eczema    Family history of malignant neoplasm of gastrointestinal tract 08/14/2020   Family history of premature CAD    History of basal cell carcinoma (BCC) excision    left upper arm 2017;   right lower leg 2018;  bilateral lower leg and hip area 2019;   inner right lower leg, outer right thigh, & upper outside left arm 09/ 2020   History of cervical dysplasia 2013   History of hereditary spherocytosis    s/p splenectomy in 1954   History of hyperparathyroidism    per pt yrs ago was put on mega dose of vit d which caused the hyperparathyroid resolved after stopping vit d   History of pelvic fracture 2013   Left inguinal hernia 01/02/2023   MAI (mycobacterium avium-intracellulare) (HCC)    ? possibly by CT 12/2018   Mild hyperlipidemia    Osteoporosis    PONV (postoperative nausea and vomiting)    S/P hernia repair 03/02/2023   S/P splenectomy    spherocytosis   Wears glasses    White coat syndrome without diagnosis of hypertension      Prior to Admission medications   Medication Sig Start Date End Date Taking? Authorizing Provider  oxycodone  (OXY-IR) 5 MG capsule Take 1 capsule (5 mg total) by mouth every 8 (eight) hours as needed. 10/08/23  Yes Patsey Lot, MD  acetaminophen  (TYLENOL ) 325 MG tablet Take 2 tablets (650 mg total) by mouth every 6 (six) hours as needed for mild pain (pain score 1-3) (or Fever >/= 101). 08/14/23   Angiulli, Toribio PARAS, PA-C  aspirin  EC 81 MG tablet Take 1 tablet (81 mg total) by mouth daily. Swallow whole. 08/13/23 11/11/23  Laurence Locus, DO  clonazePAM  (KLONOPIN ) 0.5 MG tablet Take 1 tablet (0.5 mg total) by mouth 2 (two) times daily as needed for anxiety. 09/14/23   Geofm Glade PARAS, MD  clopidogrel  (PLAVIX ) 75 MG tablet Take 1 tablet (75 mg total) by mouth daily. 09/14/23   Geofm Glade PARAS, MD  escitalopram  (LEXAPRO ) 10 MG tablet Take 1 tablet (10 mg total) by mouth at bedtime. 09/14/23   Geofm Glade PARAS, MD  losartan  (COZAAR ) 25 MG tablet Take 0.5 tablets (12.5 mg total) by mouth at bedtime. 08/18/23   Angiulli, Toribio PARAS, PA-C  magnesium  oxide (MAG-OX) 400 (240 Mg) MG tablet Take 1 tablet (400 mg  total) by mouth daily. 09/14/23   Geofm Glade PARAS, MD  Omega-3 Fatty Acids  (FISH OIL ) 600 MG CAPS Take 2 capsules by mouth at bedtime. 08/18/23   Angiulli, Toribio PARAS, PA-C  rosuvastatin  (CRESTOR ) 20 MG tablet Take 1 tablet (20 mg total) by mouth at bedtime. 08/18/23   Angiulli, Toribio PARAS, PA-C    Allergies: Penicillins, Sulfa antibiotics, Nickel, and Penicillin g    Review of Systems  Updated Vital Signs BP 118/65 (BP Location: Right Arm)   Pulse 66   Temp 98.2 F (36.8 C)   Resp 16   Ht 5' 3 (1.6 m)   Wt 55.3 kg   LMP 02/24/2002   SpO2 100%   BMI 21.61 kg/m   Physical Exam Vitals and nursing note reviewed.  HENT:     Head:     Comments: Small bruising to the left upper lip.  No underlying bony tenderness.  No midline cervical tenderness. Eyes:     Extraocular Movements: Extraocular movements  intact.  Cardiovascular:     Rate and Rhythm: Regular rhythm.  Chest:     Chest wall: No tenderness.  Abdominal:     Tenderness: There is no abdominal tenderness.  Musculoskeletal:        General: Tenderness present.     Cervical back: Neck supple.     Comments: Tenderness to right anterior knee with some swelling.  Does have some Abbed laceration or abrasion.  Does have pain with straight leg raise on that side.  However is able to raise the foot with the upper leg.  Neurological:     Mental Status: She is alert.     (all labs ordered are listed, but only abnormal results are displayed) Labs Reviewed - No data to display  EKG: None  Radiology: DG Knee Complete 4 Views Right Result Date: 10/08/2023 CLINICAL DATA:  Pain after fall. EXAM: RIGHT KNEE - COMPLETE 4+ VIEW COMPARISON:  None Available. FINDINGS: Comminuted displaced mid patellar fracture. There is articular distraction of 2 mm. Small joint effusion. Normal tibiofemoral alignment without additional fracture. There is generalized soft tissue edema IMPRESSION: Comminuted displaced mid patellar fracture. Electronically Signed   By: Andrea Gasman M.D.   On: 10/08/2023 16:20     Procedures   Medications Ordered in the ED - No data to display                                  Medical Decision Making Amount and/or Complexity of Data Reviewed Radiology: ordered.  Risk Prescription drug management.   Patient with fall.  Did hit head and is not on Plavix , however 2 days ago and I think with 2 days having passed do not need intracranial imaging at that time.  Cervical spine clinically cleared.  However does have some pain and swelling of the right anterior knee.  Differential diagnosis includes contusions but also injury such as patellar fracture.  X-ray-patellar fracture.  Independently interpreted by me.  It appears that the abrasion over the knee is not a large deep laceration.  Will give knee immobilizer and  follow-up with orthopedic surgery.  Has crutches and a walker at home.  Will discharge.     Final diagnoses:  Fall, initial encounter  Closed displaced transverse fracture of right patella, initial encounter    ED Discharge Orders          Ordered    oxycodone  (OXY-IR) 5 MG  capsule  Every 8 hours PRN        10/08/23 1642               Patsey Lot, MD 10/08/23 1654

## 2023-10-08 NOTE — ED Notes (Signed)
 DC paperwork given and verbally understood.
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
## 2023-10-08 NOTE — Telephone Encounter (Unsigned)
 Copied from CRM [id number]. Topic: General - Other >> Oct 08, 2023  3:22 PM Berneda FALCON wrote: Reason for CRM: Patient's son Alm states they are returning a phone call from [place]. Pt is currently in the hospital.  Please call son back at [phone]

## 2023-10-08 NOTE — ED Triage Notes (Signed)
 Pt reports:  Fall Tripped on Tuesday  On plavix  Clemens on her face Right knee swelling Hit knee on fall  Unable to bear weight on right leg

## 2023-10-09 ENCOUNTER — Encounter: Attending: Physical Medicine and Rehabilitation | Admitting: Physical Medicine and Rehabilitation

## 2023-10-09 VITALS — BP 119/76 | HR 74 | Ht 63.0 in | Wt 120.0 lb

## 2023-10-09 DIAGNOSIS — I63511 Cerebral infarction due to unspecified occlusion or stenosis of right middle cerebral artery: Secondary | ICD-10-CM | POA: Diagnosis not present

## 2023-10-09 DIAGNOSIS — M810 Age-related osteoporosis without current pathological fracture: Secondary | ICD-10-CM | POA: Insufficient documentation

## 2023-10-09 DIAGNOSIS — I639 Cerebral infarction, unspecified: Secondary | ICD-10-CM | POA: Insufficient documentation

## 2023-10-09 DIAGNOSIS — S82031A Displaced transverse fracture of right patella, initial encounter for closed fracture: Secondary | ICD-10-CM | POA: Diagnosis not present

## 2023-10-09 NOTE — Progress Notes (Signed)
 Subjective:    Patient ID: Alicia Wilkerson, female    DOB: 07/01/1950, 73 y.o.   MRN: [id number]  HPI: Alicia Wilkerson is a 73 y.o. female who is here or HFU appointment for follow up of her  Right Middle Cerebral Artery Stroke, Essential Hypertension and Hyperlipidemia. She presented to [facility] on 06/125/2025 with altered mental status.   Dr. Marcene: H&P  Alicia Wilkerson is a 73 y.o. female with medical history significant for essential pretension, hyperlipidemia, who is admitted to [facility] on 08/09/2023 with acute encephalopathy after presenting from home to [facility] ED complaining of altered mental status.    In setting of the patient's altered mental status, history is provided by patient, as well as her daughter, in addition to my discussions with the EDP ED chart review.   Daughter conveys that the patient has been altered, confused over the last 1 to 2 days.  Daughter, who lives in the state of ER, first noted the patient to be confused on Saturday, 08/08/2023, with last known well occurring on Friday, 08/07/2023.  On Saturday, 08/08/2023, daughter also felt, via FaceTime evaluation, the patient was exhibiting some evidence of facial droop.   The patient reportedly experienced a ground-level fall on Saturday, 08/08/2023, in which she was leaning forward to pick something up, and tripped, resulting in her hitting her head on the floor, without any associated loss of consciousness.  She is on daily baby aspirin  as an outpatient in the absence of any additional blood thinners at home.  Additionally, the patient reportedly fell out of her bed later on Saturday, 08/08/2023, which was also associated with no loss of consciousness.   No known history of diabetes .   Given ongoing confusion exhibited by the patient, daughter arrived from [place]  earlier today, and initially brought the patient to a local urgent care, before receiving recommendation to present to the ED for further evaluation  management of the above.     CT Head: WO Contrast:  Narrative & Impression  CLINICAL DATA:  Neuro deficit, acute, stroke suspected; Facial trauma, blunt   EXAM: CT HEAD WITHOUT CONTRAST   CT MAXILLOFACIAL WITHOUT CONTRAST   TECHNIQUE: Multidetector CT imaging of the head and maxillofacial structures were performed using the standard protocol without intravenous contrast. Multiplanar CT image reconstructions of the maxillofacial structures were also generated.   RADIATION DOSE REDUCTION: This exam was performed according to the departmental dose-optimization program which includes automated exposure control, adjustment of the mA and/or kV according to patient size and/or use of iterative reconstruction technique.   COMPARISON:  None Available.   FINDINGS: CT HEAD FINDINGS   Brain:   Atherosclerotic calcifications are present within the cavernous internal carotid arteries. No evidence of large-territorial acute infarction. No parenchymal hemorrhage. No mass lesion. No extra-axial collection.   No mass effect or midline shift. No hydrocephalus. Basilar cisterns are patent.   Vascular: No hyperdense vessel. Atherosclerotic calcifications are present within the cavernous internal carotid arteries.   Skull: No acute fracture or focal lesion.   Other: None.   CT MAXILLOFACIAL FINDINGS   Osseous: No fracture or mandibular dislocation. No destructive process.   Sinuses/Orbits: Right maxillary sinus mucosal thickening. Paranasal sinuses and mastoid air cells are clear. The orbits are unremarkable.   Soft tissues: Negative.   Visualized upper cervical spine: Degenerative changes.   IMPRESSION: 1. No acute intracranial abnormality. 2.  No acute displaced facial fracture.     CTA:  IMPRESSION: 1.  Occlusion of the right ICA just distal to the bifurcation, age indeterminate, but presumably acute given patient's symptoms. Right ICA remains occluded to the terminus.  Revascularization of the right MCA via collateral flow across the circle-of-Willis. 2. Fetal type origin of the right PCA with severe stenosis versus occlusion at the origin of the right PCOM. Right PCA attenuated but patent distally. 3. Mild atheromatous change about the left carotid bulb and left carotid siphon without hemodynamically significant stenosis. 4. Small area of reticulonodular tree-in-bud densities within the left upper lobe, likely mild infection/small airways disease. 5.  Aortic Atherosclerosis (ICD10-I70.0).   Critical Value/emergent results were called by telephone at the time of interpretation on 08/09/2023 at 10:40 pm to provider DAN FLOYD , who verbally acknowledged these results.    MR: Brain: IMPRESSION: 1. Patchy small volume acute ischemic nonhemorrhagic right PCA territory infarcts as above, with a few additional punctate infarcts within the right frontal lobe. 2. Underlying mildly advanced cerebral atrophy.  Neurology Consulted: Recommended: Low Dose Aspirin  and Plavix  x 3 months  then Plavix  alone   [name] was admitted to inpatient rehabilitation on 08/13/2023 and discharged home on 08/19/2023. She is scheduled for Outpatient therapuy at [facility] on 09/23/2023.  She denies any pain . She rates her pain 0. Also reports she has a good appetite.   Doing well at home She wants to be able to drive as it is very isolating Sleeping well at night Pain is in right knee where she had a fracture  Pain Inventory Average Pain 3 Pain Right Now 0 My pain is .  In the last 24 hours, has pain interfered with the following? General activity 2 Relation with others 0 Enjoyment of life 0 What TIME of day is your pain at its worst? varies Sleep (in general) Good  Pain is worse with: . Pain improves with: . Relief from Meds: .  Family History  Problem Relation Age of Onset   Osteoporosis Mother    Colon cancer Mother        deceased    Heart failure Father    Skin cancer Father    Hypertension Father    Heart disease Father    Heart disease Sister    Crohn's disease Daughter    Breast cancer Neg Hx    Social History   Socioeconomic History   Marital status: Widowed    Spouse name: Not on file   Number of children: 3   Years of education: Not on file   Highest education level: Not on file  Occupational History   Occupation: RETIRED  Tobacco Use   Smoking status: Never   Smokeless tobacco: Never  Vaping Use   Vaping status: Never Used  Substance and Sexual Activity   Alcohol use: Not Currently   Drug use: No   Sexual activity: Not Currently    Partners: Male    Birth control/protection: Surgical, Post-menopausal    Comment: BTL & hysterectomy  Other Topics Concern   Not on file  Social History Narrative   Lives alone. 2025   Social Drivers of Corporate investment banker Strain: Low Risk  (05/01/2023)   Overall Financial Resource Strain (CARDIA)    Difficulty of Paying Living Expenses: Not very hard  Food Insecurity: No Food Insecurity (08/10/2023)   Hunger Vital Sign    Worried About Running Out of Food in the Last Year: Never true    Ran Out of Food in the Last Year: Never true  Transportation Needs: No Transportation Needs (08/10/2023)   PRAPARE - Administrator, Civil Service (Medical): No    Lack of Transportation (Non-Medical): No  Physical Activity: Insufficiently Active (05/01/2023)   Exercise Vital Sign    Days of Exercise per Week: 4 days    Minutes of Exercise per Session: 30 min  Stress: Stress Concern Present (05/01/2023)   [employer] of Occupational Health - Occupational Stress Questionnaire    Feeling of Stress : To some extent  Social Connections: Moderately Isolated (08/10/2023)   Social Connection and Isolation Panel    Frequency of Communication with Friends and Family: More than three times a week    Frequency of Social Gatherings with Friends and Family: More than  three times a week    Attends Religious Services: Never    Database administrator or Organizations: Yes    Attends Banker Meetings: 1 to 4 times per year    Marital Status: Widowed   Past Surgical History:  Procedure Laterality Date   ANKLE SURGERY  03/14/2015   BREAST BIOPSY Left 08/30/2021   CYSTIC APOCRINE METAPLASIA   CERVICAL CONIZATION W/BX N/A 12/13/2018   Procedure: CONIZATION CERVIX WITH BIOPSY;  Surgeon: Cleotilde Ronal RAMAN, MD;  Location: [facility] OR;  Service: Gynecology;  Laterality: N/A;   CYSTOSCOPY N/A 10/18/2019   Procedure: CYSTOSCOPY;  Surgeon: Cleotilde Ronal RAMAN, MD;  Location: [facility];  Service: Gynecology;  Laterality: N/A;   IR RADIOLOGY PERIPHERAL GUIDED IV START  01/05/2019   IR US  GUIDE VASC ACCESS RIGHT  01/05/2019   KNEE SURGERY Left 1991   per pt retained hardward   LEEP N/A 12/13/2018   Procedure: possible LOOP ELECTROSURGICAL EXCISION PROCEDURE (LEEP);  Surgeon: Cleotilde Ronal RAMAN, MD;  Location: [facility] OR;  Service: Gynecology;  Laterality: N/A;   ORIF ANKLE FRACTURE Left 03/14/2015   per pt retained hardware   ORIF WRIST FRACTURE Left 03/10/2022   Procedure: OPEN REDUCTION INTERNAL FIXATION (ORIF) WRIST FRACTURE;  Surgeon: Shari Easter, MD;  Location: [facility] OR;  Service: Orthopedics;  Laterality: Left;  regional with iv sedation   SPLENECTOMY, TOTAL  1954   due to spherocytosis   TOTAL LAPAROSCOPIC HYSTERECTOMY WITH SALPINGECTOMY N/A 10/18/2019   Procedure: TOTAL LAPAROSCOPIC HYSTERECTOMY WITH BILATERAL SALPINGECTOMY AND BILATERAL OOPHORECTOMY;  Surgeon: Cleotilde Ronal RAMAN, MD;  Location[facility] ;  Service: Gynecology;  Laterality: N/A;  BILATERAL SALPINGECTOMY POSS OOPHORECTOMY   TUBAL LIGATION Bilateral 1995   Past Surgical History:  Procedure Laterality Date   ANKLE SURGERY  03/14/2015   BREAST BIOPSY Left 08/30/2021   CYSTIC APOCRINE METAPLASIA   CERVICAL CONIZATION W/BX N/A 12/13/2018   Procedure: CONIZATION CERVIX WITH  BIOPSY;  Surgeon: Cleotilde Ronal RAMAN, MD;  Location: [facility] OR;  Service: Gynecology;  Laterality: N/A;   CYSTOSCOPY N/A 10/18/2019   Procedure: CYSTOSCOPY;  Surgeon: Cleotilde Ronal RAMAN, MD;  Location: [facility];  Service: Gynecology;  Laterality: N/A;   IR RADIOLOGY PERIPHERAL GUIDED IV START  01/05/2019   IR US  GUIDE VASC ACCESS RIGHT  01/05/2019   KNEE SURGERY Left 1991   per pt retained hardward   LEEP N/A 12/13/2018   Procedure: possible LOOP ELECTROSURGICAL EXCISION PROCEDURE (LEEP);  Surgeon: Cleotilde Ronal RAMAN, MD;  Location: [facility] OR;  Service: Gynecology;  Laterality: N/A;   ORIF ANKLE FRACTURE Left 03/14/2015   per pt retained hardware   ORIF WRIST FRACTURE Left 03/10/2022   Procedure: OPEN REDUCTION INTERNAL FIXATION (ORIF) WRIST  FRACTURE;  Surgeon: Shari Easter, MD;  Location: [facility] OR;  Service: Orthopedics;  Laterality: Left;  regional with iv sedation   SPLENECTOMY, TOTAL  1954   due to spherocytosis   TOTAL LAPAROSCOPIC HYSTERECTOMY WITH SALPINGECTOMY N/A 10/18/2019   Procedure: TOTAL LAPAROSCOPIC HYSTERECTOMY WITH BILATERAL SALPINGECTOMY AND BILATERAL OOPHORECTOMY;  Surgeon: Cleotilde Ronal RAMAN, MD;  Location: [facility];  Service: Gynecology;  Laterality: N/A;  BILATERAL SALPINGECTOMY POSS OOPHORECTOMY   TUBAL LIGATION Bilateral 1995   Past Medical History:  Diagnosis Date   Anemia    Anxiety disorder    Basal cell carcinoma (BCC) of left upper arm 12/2015   Basal cell carcinoma (BCC) of right lower leg 04/2016   Borderline hypertension    CAD (coronary artery disease)    a. nonobst by cor CT 12/2018.   CIN II (cervical intraepithelial neoplasia II)    CIN  1   Depression    Eczema    Family history of malignant neoplasm of gastrointestinal tract 08/14/2020   Family history of premature CAD    History of basal cell carcinoma (BCC) excision    left upper arm 2017;   right lower leg 2018;  bilateral lower leg and hip area 2019;   inner right lower leg, outer  right thigh, & upper outside left arm 09/ 2020   History of cervical dysplasia 2013   History of hereditary spherocytosis    s/p splenectomy in 1954   History of hyperparathyroidism    per pt yrs ago was put on mega dose of vit d which caused the hyperparathyroid resolved after stopping vit d   History of pelvic fracture 2013   Left inguinal hernia 01/02/2023   MAI (mycobacterium avium-intracellulare) (HCC)    ? possibly by CT 12/2018   Mild hyperlipidemia    Osteoporosis    PONV (postoperative nausea and vomiting)    S/P hernia repair 03/02/2023   S/P splenectomy    spherocytosis   Wears glasses    White coat syndrome without diagnosis of hypertension    BP 119/76   Pulse 74   Ht 5' 3 (1.6 m)   Wt 120 lb (54.4 kg)   LMP 02/24/2002   SpO2 98%   BMI 21.26 kg/m   Opioid Risk Score:   Fall Risk Score:  `1  Depression screen PHQ 2/9     09/11/2023    1:56 PM 08/25/2023    1:11 PM 05/01/2023    1:28 PM 02/24/2023    2:35 PM 09/04/2022    9:50 AM 04/02/2022    4:18 PM 11/19/2021    4:29 PM  Depression screen PHQ 2/9  Decreased Interest 0 0 0 0 0 0 0  Down, Depressed, Hopeless 0 0 0 0 0 0 0  PHQ - 2 Score 0 0 0 0 0 0 0  Altered sleeping 0 0 0      Tired, decreased energy 0 0 0      Change in appetite 0 0 0      Feeling bad or failure about yourself  0 0 0      Trouble concentrating 0 0 0      Moving slowly or fidgety/restless 0 0 0      Suicidal thoughts 0 0 0      PHQ-9 Score 0 0 0      Difficult doing work/chores Not difficult at all Not difficult at all Not difficult at all  Opioid Risk Score:   Fall Risk Score:  `1  Depression screen PHQ 2/9    Review of Systems  All other systems reviewed and are negative.      Objective:   Physical Exam Vitals and nursing note reviewed.  Constitutional:      Appearance: Normal appearance.  Cardiovascular:     Rate and Rhythm: Normal rate and regular rhythm.  Musculoskeletal:     Comments:  Normal Muscle Bulk and Muscle Testing Reveals:  Upper Extremities: Full ROM and Muscle Strength  5/5 Lower Extremities : Full ROM and Muscle Strength 5/5 Arises from Table with ease Narrow Based Gait     Skin:    General: Skin is warm and dry.  Neurological:     Mental Status: She is alert and oriented to person, place, and time.  Psychiatric:        Mood and Affect: Mood normal.        Behavior: Behavior normal.    Stable 8/15       Assessment & Plan:  Right Middle Cerebral Artery Stroke: Continue current medication regimen. She has a scheduled appointment with [facility]. She has a scheduled appointment with neurology. Continue to Monitor. Continue aspirin  and plavix  Discussed that after 3 months she can stop aspirin  Encourage activity Discussed that sons not balance is off Recommended plant based omega 3's Discussed journaling her exercise Discussed that she has been recovering well ,Essential Hypertension: Continue current medication regimen. PCP following. Continue to Monitor D/c losartan   Hyperlipidemia. : Continue current medication regimen. Continue to Monitor.  Osteoporosis: Discussed that this has been longstanding Discussed that she hates walking on the treadmill Discussed that being sedentary is a risk factor F/U with Dr Lorilee in 4- 6 weeks

## 2023-10-09 NOTE — Patient Instructions (Addendum)
 Red light therapy [place], [place]  Methylcobalamin B12/methylfolate Pure Encapsulations  Green leafy vegetables  Chia seeds, hemp seeds, algea oil  Wild blueberries [employer]

## 2023-10-09 NOTE — Telephone Encounter (Signed)
 Left message for patient's son that I had called on yesterday to encourage mom to be seen but I could hear them on the phone being worked up by nurse at the ER.

## 2023-10-11 ENCOUNTER — Other Ambulatory Visit: Payer: Self-pay | Admitting: Nurse Practitioner

## 2023-10-13 DIAGNOSIS — S82041A Displaced comminuted fracture of right patella, initial encounter for closed fracture: Secondary | ICD-10-CM | POA: Diagnosis not present

## 2023-10-15 ENCOUNTER — Telehealth: Payer: Self-pay

## 2023-10-15 DIAGNOSIS — S82041A Displaced comminuted fracture of right patella, initial encounter for closed fracture: Secondary | ICD-10-CM | POA: Diagnosis not present

## 2023-10-15 NOTE — Telephone Encounter (Signed)
 Copied from CRM #[id number]. Topic: General - Other >> Oct 15, 2023  4:05 PM Lauren C wrote: Reason for CRM: Pts son calling in to update us  with on status after she was red word triaged. She was diagnosed with a shattered patella at hospital. She is on schedule to have surgery on rt knee this Monday at [facility] long. She will be inpatient for a day. Son will give us  a call back to schedule hospital follow up once she has her schedule. FYI

## 2023-10-15 NOTE — H&P (Signed)
 Patient's anticipated LOS is less than 2 midnights, meeting these requirements: - Younger than 90 - Lives within 1 hour of care - Has a competent adult at home to recover with post-op recover - NO history of  - Chronic pain requiring opiods  - Diabetes  - Coronary Artery Disease  - Heart failure  - Heart attack  - Stroke  - DVT/VTE  - Cardiac arrhythmia  - Respiratory Failure/COPD  - Renal failure  - Anemia  - Advanced Liver disease     Alicia Wilkerson is an 73 y.o. female.    Chief Complaint: right knee pain  HPI: Pt is a 73 y.o. female complaining of right knee pain after recent fall. Pain had continually increased since the beginning. X-rays in the clinic show displaced right patella fracture.  Various options are discussed with the patient. Risks, benefits and expectations were discussed with the patient. Patient understand the risks, benefits and expectations and wishes to proceed with surgery.   PCP:  Geofm Glade PARAS, MD  D/C Plans: Home  PMH: Past Medical History:  Diagnosis Date   Anemia    Anxiety disorder    Basal cell carcinoma (BCC) of left upper arm 12/2015   Basal cell carcinoma (BCC) of right lower leg 04/2016   Borderline hypertension    CAD (coronary artery disease)    a. nonobst by cor CT 12/2018.   CIN II (cervical intraepithelial neoplasia II)    CIN  1   Depression    Eczema    Family history of malignant neoplasm of gastrointestinal tract 08/14/2020   Family history of premature CAD    History of basal cell carcinoma (BCC) excision    left upper arm 2017;   right lower leg 2018;  bilateral lower leg and hip area 2019;   inner right lower leg, outer right thigh, & upper outside left arm 09/ 2020   History of cervical dysplasia 2013   History of hereditary spherocytosis    s/p splenectomy in 1954   History of hyperparathyroidism    per pt yrs ago was put on mega dose of vit d which caused the hyperparathyroid resolved after stopping vit d    History of pelvic fracture 2013   Left inguinal hernia 01/02/2023   MAI (mycobacterium avium-intracellulare) (HCC)    ? possibly by CT 12/2018   Mild hyperlipidemia    Osteoporosis    PONV (postoperative nausea and vomiting)    S/P hernia repair 03/02/2023   S/P splenectomy    spherocytosis   Wears glasses    White coat syndrome without diagnosis of hypertension     PSH: Past Surgical History:  Procedure Laterality Date   ANKLE SURGERY  03/14/2015   BREAST BIOPSY Left 08/30/2021   CYSTIC APOCRINE METAPLASIA   CERVICAL CONIZATION W/BX N/A 12/13/2018   Procedure: CONIZATION CERVIX WITH BIOPSY;  Surgeon: Cleotilde Ronal RAMAN, MD;  Location: [facility] OR;  Service: Gynecology;  Laterality: N/A;   CYSTOSCOPY N/A 10/18/2019   Procedure: CYSTOSCOPY;  Surgeon: Cleotilde Ronal RAMAN, MD;  Location: [facility];  Service: Gynecology;  Laterality: N/A;   IR RADIOLOGY PERIPHERAL GUIDED IV START  01/05/2019   IR US  GUIDE VASC ACCESS RIGHT  01/05/2019   KNEE SURGERY Left 1991   per pt retained hardward   LEEP N/A 12/13/2018   Procedure: possible LOOP ELECTROSURGICAL EXCISION PROCEDURE (LEEP);  Surgeon: Cleotilde Ronal RAMAN, MD;  Location: [facility] OR;  Service: Gynecology;  Laterality: N/A;   ORIF ANKLE  FRACTURE Left 03/14/2015   per pt retained hardware   ORIF WRIST FRACTURE Left 03/10/2022   Procedure: OPEN REDUCTION INTERNAL FIXATION (ORIF) WRIST FRACTURE;  Surgeon: Shari Easter, MD;  Location: [facility] OR;  Service: Orthopedics;  Laterality: Left;  regional with iv sedation   SPLENECTOMY, TOTAL  1954   due to spherocytosis   TOTAL LAPAROSCOPIC HYSTERECTOMY WITH SALPINGECTOMY N/A 10/18/2019   Procedure: TOTAL LAPAROSCOPIC HYSTERECTOMY WITH BILATERAL SALPINGECTOMY AND BILATERAL OOPHORECTOMY;  Surgeon: Cleotilde Ronal RAMAN, MD;  Location: [facility];  Service: Gynecology;  Laterality: N/A;  BILATERAL SALPINGECTOMY POSS OOPHORECTOMY   TUBAL LIGATION Bilateral 1995    Social History:  reports that she  has never smoked. She has never used smokeless tobacco. She reports that she does not currently use alcohol. She reports that she does not use drugs. BMI: Estimated body mass index is 21.26 kg/m as calculated from the following:   Height as of 10/09/23: 5' 3 (1.6 m).   Weight as of 10/09/23: 54.4 kg.  Lab Results  Component Value Date   ALBUMIN 2.8 (L) 08/14/2023   Diabetes: Patient does not have a diagnosis of diabetes. Lab Results  Component Value Date   HGBA1C 3.7 (L) 08/10/2023     Smoking Status:   reports that she has never smoked. She has never used smokeless tobacco.    Allergies:  Allergies  Allergen Reactions   Penicillins Rash    Did it involve swelling of the face/tongue/throat, SOB, or low BP? No Did it involve sudden or severe rash/hives, skin peeling, or any reaction on the inside of your mouth or nose?  #  #  #  YES  #  #  #  Did you need to seek medical attention at a hospital or doctor's office? #  #  #  YES  #  #  #  When did it last happen?   years ago    If all above answers are NO, may proceed with cephalosporin use.    Sulfa Antibiotics Hives and Other (See Comments)   Nickel Hives   Penicillin G Other (See Comments)    Medications: No current facility-administered medications for this encounter.   Current Outpatient Medications  Medication Sig Dispense Refill   acetaminophen  (TYLENOL ) 325 MG tablet Take 2 tablets (650 mg total) by mouth every 6 (six) hours as needed for mild pain (pain score 1-3) (or Fever >/= 101).     aspirin  EC 81 MG tablet Take 1 tablet (81 mg total) by mouth daily. Swallow whole.     clonazePAM  (KLONOPIN ) 0.5 MG tablet Take 1 tablet (0.5 mg total) by mouth 2 (two) times daily as needed for anxiety. 30 tablet 4   clopidogrel  (PLAVIX ) 75 MG tablet Take 1 tablet (75 mg total) by mouth daily. 30 tablet 5   escitalopram  (LEXAPRO ) 10 MG tablet Take 1 tablet (10 mg total) by mouth at bedtime. 30 tablet 5   losartan  (COZAAR ) 25 MG  tablet Take 0.5 tablets (12.5 mg total) by mouth at bedtime. 30 tablet 0   magnesium  oxide (MAG-OX) 400 (240 Mg) MG tablet Take 1 tablet (400 mg total) by mouth daily. 30 tablet 5   Omega-3 Fatty Acids  (FISH OIL ) 600 MG CAPS Take 2 capsules by mouth at bedtime. 30 capsule 0   oxycodone  (OXY-IR) 5 MG capsule Take 1 capsule (5 mg total) by mouth every 8 (eight) hours as needed. 8 capsule 0   rosuvastatin  (CRESTOR ) 20 MG tablet Take 1 tablet (  20 mg total) by mouth daily. Must keep scheduled appointment for future refills. Thank you. 30 tablet 0    No results found for this or any previous visit (from the past 48 hours). No results found.  ROS: Pain with rom of the right lower extremity  Physical Exam: Alert and oriented 73 y.o. female in no acute distress Cranial nerves 2-12 intact Cervical spine: full rom with no tenderness, nv intact distally Chest: active breath sounds bilaterally, no wheeze rhonchi or rales Heart: regular rate and rhythm, no murmur Abd: non tender non distended with active bowel sounds Hip is stable with rom  Right knee with large effusion and decreased rom Nv intact distally Antalgic gait with knee immobilizer  Assessment/Plan Assessment: displaced right patella fracture  Plan:  Patient will undergo a right patella ORIF by Dr. Kay at [place] Risks benefits and expectations were discussed with the patient. Patient understand risks, benefits and expectations and wishes to proceed. Preoperative templating of the joint replacement has been completed, documented, and submitted to the Operating Room personnel in order to optimize intra-operative equipment management.   Arvella Fireman PA-C, MPAS [facility] is now [employer] [address], [place], [place] [address] Phone: [phone] [email or web address] Facebook  [employer]

## 2023-10-16 ENCOUNTER — Other Ambulatory Visit: Payer: Self-pay

## 2023-10-16 ENCOUNTER — Encounter (HOSPITAL_COMMUNITY): Payer: Self-pay | Admitting: Orthopedic Surgery

## 2023-10-16 ENCOUNTER — Encounter (HOSPITAL_COMMUNITY): Payer: Self-pay

## 2023-10-16 NOTE — Progress Notes (Signed)
 Attempted to obtain medical history via telephone, unable to reach at this time. HIPAA compliant voicemail message left requesting return call to pre surgical testing department.

## 2023-10-16 NOTE — Progress Notes (Addendum)
 For Anesthesia: PCP - Glade JINNY Hope, MD  Cardiologist - Dr. WENDI Bruckner Neurologist- Dr. Sven  Bowel Prep reminder: N/A  Chest x-ray -  EKG - 08/11/23 in [facility] Stress Test - 11/18/21 in [facility] ECHO - 08/11/23 in [facility] Cardiac Cath -  Pacemaker/ICD device last checked: N/A Pacemaker orders received: N/A Device Rep notified: N/A  Spinal Cord Stimulator: N/A  Sleep Study - N/A CPAP - N/A  Fasting Blood Sugar - N/A Checks Blood Sugar __N/A___ times a day Date and result of last Hgb A1c-N/A   Blood Thinner Instructions: Plavix  last dose 10/15/23 at 1215 P Aspirin  Instructions: continue Last Dose:  Activity level:  activities of daily living without stopping and without chest pain and/or shortness of breath.    Anesthesia review: CAD, History of stroke 08/07/2023  Patient denies shortness of breath, fever, cough and chest pain at PAT appointment   Patient verbalized understanding of instructions that were reviewed over the telephone.

## 2023-10-16 NOTE — Progress Notes (Addendum)
 Anesthesia Chart Review   Case: [id number] Date/Time: 10/19/23 1545   Procedure: OPEN REDUCTION INTERNAL FIXATION (ORIF) PATELLA (Right: Knee)   Anesthesia type: General   Diagnosis: Closed displaced comminuted fracture of right patella, initial encounter [S82.041A]   Pre-op diagnosis: Closed displaced comminuted fracture of right patella, initial encounter   Location: [facility] ROOM 10 / [facility]   Surgeons: Kay Kemps, MD       DISCUSSION:.73 y.o. never smoker with h/o PONV, HTN, CAD, s/p splenectomy, stroke 08/09/2023, closed displace comminuted fracture of right patella scheduled for above procedure 10/19/2023 with Dr. Kemps Kay.   Pt s/p right middle cerebral artery stroke 08/09/2023. Pt to remain on ASA and Clopidegrel for 3 months and then Plavix  alone. Discharged to rhab facility. Left rehab facility on 08/19/23.  She has not had follow up with neurology yet. Seen by cardiology during admission. 30 day heart rate monitor to be done after discharge, no a-fib noted.   Coronary CTA with moderate-to-severe calcified plaque in the prox-mid LAD with negative FFR. Ca score 544 (96%). Myoview  10/2021 with no ischemia and robust EF.   Pt last seen by cardiology 05/19/2022, stable at this visit with 1 year follow up recommended.   S/p mechanical fall 8/14 suffering patella fracture.   No clearance from neuro regarding Plavix .  Pt reports last dose of Plavix  10/15/23. Neurology office is closed today.  Discussed with Dr. Leopoldo. Will call Monday morning before procedure.   Addendum 10/19/2023:  Per Dr. Rosemarie with neurology, ok to hold plavix  for patella surgery and resume after surgery when safe with a small bu tacceptable periprocedural risk of stroke/TIA if pt willing. avoid hypotension in peiop period.  VS: Ht 5' 2.75 (1.594 m)   Wt 55.3 kg   LMP 02/24/2002   BMI 21.78 kg/m   PROVIDERS: Geofm Glade PARAS, MD is PCP    LABS: Pt not seen in PAT clinic, same day workup, labs DOS.  (all labs  ordered are listed, but only abnormal results are displayed)  Labs Reviewed - No data to display   IMAGES:   EKG:   CV: Echo 08/11/2023 1. Left ventricular ejection fraction, by estimation, is 60 to 65%. The  left ventricle has normal function. The left ventricle has no regional  wall motion abnormalities. There is mild left ventricular hypertrophy.  Left ventricular diastolic parameters  are consistent with Grade I diastolic dysfunction (impaired relaxation).  Consider evaluation for infiltrative cardiomyopathy. (I.E HCM, amyloid)   2. Right ventricular systolic function is normal. The right ventricular  size is normal. Mildly increased right ventricular wall thickness.   3. Left atrial size was moderately dilated.   4. Small to moderate sized pericardial effusion. No signs of tamponade.   5. The mitral valve is normal in structure. No evidence of mitral valve  regurgitation. No evidence of mitral stenosis.   6. The aortic valve is normal in structure. Aortic valve regurgitation is  not visualized. No aortic stenosis is present.   7. The inferior vena cava is normal in size with greater than 50%  respiratory variability, suggesting right atrial pressure of 3 mmHg.   Myocardial Perfusion 11/18/2021   The study is normal. The study is low risk.   No ST deviation was noted.   LV perfusion is abnormal. There is no evidence of ischemia. There is no evidence of infarction. Defect 1: There is a small defect with severe reduction in uptake present in the apical to mid anteroseptal location(s) that is  fixed. There is normal wall motion in the defect area. Consistent with artifact caused by breast attenuation.   Left ventricular function is normal. Nuclear stress EF: 78 %. The left ventricular ejection fraction is hyperdynamic (>65%). End diastolic cavity size is normal. End systolic cavity size is normal.   Prior study not available for comparison.   Normal stress nuclear study with fixed  anteroseptal defect possibly secondary to breast attenuation.  There is no ischemia noted.  Gated ejection fraction 78% with normal wall motion. Past Medical History:  Diagnosis Date   Anemia    Anxiety disorder    Basal cell carcinoma (BCC) of left upper arm 12/2015   Basal cell carcinoma (BCC) of right lower leg 04/2016   Borderline hypertension    CAD (coronary artery disease)    a. nonobst by cor CT 12/2018.   CIN II (cervical intraepithelial neoplasia II)    CIN  1   Depression    Eczema    Family history of malignant neoplasm of gastrointestinal tract 08/14/2020   Family history of premature CAD    Hard of hearing    wears hearing aids   History of basal cell carcinoma (BCC) excision    left upper arm 2017;   right lower leg 2018;  bilateral lower leg and hip area 2019;   inner right lower leg, outer right thigh, & upper outside left arm 09/ 2020   History of cervical dysplasia 2013   History of hereditary spherocytosis    s/p splenectomy in 1954   History of hyperparathyroidism    per pt yrs ago was put on mega dose of vit d which caused the hyperparathyroid resolved after stopping vit d   History of pelvic fracture 2013   Ischemic stroke (HCC) 08/07/2023   Left inguinal hernia 01/02/2023   MAI (mycobacterium avium-intracellulare) (HCC)    ? possibly by CT 12/2018   Mild hyperlipidemia    Osteoporosis    PONV (postoperative nausea and vomiting)    S/P hernia repair 03/02/2023   S/P splenectomy    spherocytosis   Wears glasses    White coat syndrome without diagnosis of hypertension     Past Surgical History:  Procedure Laterality Date   ANKLE SURGERY  03/14/2015   BREAST BIOPSY Left 08/30/2021   CYSTIC APOCRINE METAPLASIA   CERVICAL CONIZATION W/BX N/A 12/13/2018   Procedure: CONIZATION CERVIX WITH BIOPSY;  Surgeon: Cleotilde Ronal RAMAN, MD;  Location: [facility] OR;  Service: Gynecology;  Laterality: N/A;   CYSTOSCOPY N/A 10/18/2019   Procedure: CYSTOSCOPY;  Surgeon: Cleotilde Ronal RAMAN, MD;  Location: [facility];  Service: Gynecology;  Laterality: N/A;   IR RADIOLOGY PERIPHERAL GUIDED IV START  01/05/2019   IR US  GUIDE VASC ACCESS RIGHT  01/05/2019   KNEE SURGERY Left 1991   per pt retained hardward   LEEP N/A 12/13/2018   Procedure: possible LOOP ELECTROSURGICAL EXCISION PROCEDURE (LEEP);  Surgeon: Cleotilde Ronal RAMAN, MD;  Location: [facility] OR;  Service: Gynecology;  Laterality: N/A;   ORIF ANKLE FRACTURE Left 03/14/2015   per pt retained hardware   ORIF WRIST FRACTURE Left 03/10/2022   Procedure: OPEN REDUCTION INTERNAL FIXATION (ORIF) WRIST FRACTURE;  Surgeon: Shari Easter, MD;  Location: [facility] OR;  Service: Orthopedics;  Laterality: Left;  regional with iv sedation   SPLENECTOMY, TOTAL  1954   due to spherocytosis   TOTAL LAPAROSCOPIC HYSTERECTOMY WITH SALPINGECTOMY N/A 10/18/2019   Procedure: TOTAL LAPAROSCOPIC HYSTERECTOMY WITH BILATERAL SALPINGECTOMY AND BILATERAL OOPHORECTOMY;  Surgeon: Cleotilde Ronal RAMAN, MD;  Location: [facility];  Service: Gynecology;  Laterality: N/A;  BILATERAL SALPINGECTOMY POSS OOPHORECTOMY   TUBAL LIGATION Bilateral 1995    MEDICATIONS: No current facility-administered medications for this encounter.    acetaminophen  (TYLENOL ) 325 MG tablet   aspirin  EC 81 MG tablet   clonazePAM  (KLONOPIN ) 0.5 MG tablet   clopidogrel  (PLAVIX ) 75 MG tablet   escitalopram  (LEXAPRO ) 10 MG tablet   losartan  (COZAAR ) 25 MG tablet   magnesium  oxide (MAG-OX) 400 (240 Mg) MG tablet   Omega-3 Fatty Acids  (FISH OIL ) 600 MG CAPS   oxycodone  (OXY-IR) 5 MG capsule   rosuvastatin  (CRESTOR ) 20 MG tablet      [facility] Ward, PA-C [facility] Pre-Surgical Testing [phone]

## 2023-10-19 ENCOUNTER — Ambulatory Visit (HOSPITAL_COMMUNITY): Payer: Self-pay | Admitting: Physician Assistant

## 2023-10-19 ENCOUNTER — Ambulatory Visit (HOSPITAL_COMMUNITY)

## 2023-10-19 ENCOUNTER — Encounter (HOSPITAL_COMMUNITY): Payer: Self-pay | Admitting: Orthopedic Surgery

## 2023-10-19 ENCOUNTER — Encounter (HOSPITAL_COMMUNITY)
Admission: RE | Disposition: A | Discharge status: Home / Self Care | Payer: Self-pay | Source: Home / Self Care | Attending: Orthopedic Surgery

## 2023-10-19 ENCOUNTER — Other Ambulatory Visit: Payer: Self-pay

## 2023-10-19 ENCOUNTER — Observation Stay (HOSPITAL_COMMUNITY)
Admission: RE | Admit: 2023-10-19 | Discharge: 2023-10-20 | Disposition: A | Discharge status: Home / Self Care | Attending: Orthopedic Surgery | Admitting: Orthopedic Surgery

## 2023-10-19 DIAGNOSIS — G8918 Other acute postprocedural pain: Secondary | ICD-10-CM | POA: Diagnosis not present

## 2023-10-19 DIAGNOSIS — I10 Essential (primary) hypertension: Secondary | ICD-10-CM | POA: Diagnosis not present

## 2023-10-19 DIAGNOSIS — S82001A Unspecified fracture of right patella, initial encounter for closed fracture: Secondary | ICD-10-CM | POA: Diagnosis present

## 2023-10-19 DIAGNOSIS — S82041A Displaced comminuted fracture of right patella, initial encounter for closed fracture: Secondary | ICD-10-CM

## 2023-10-19 DIAGNOSIS — Z7982 Long term (current) use of aspirin: Secondary | ICD-10-CM | POA: Diagnosis not present

## 2023-10-19 DIAGNOSIS — W19XXXA Unspecified fall, initial encounter: Secondary | ICD-10-CM | POA: Insufficient documentation

## 2023-10-19 DIAGNOSIS — I2510 Atherosclerotic heart disease of native coronary artery without angina pectoris: Principal | ICD-10-CM | POA: Insufficient documentation

## 2023-10-19 DIAGNOSIS — Z472 Encounter for removal of internal fixation device: Secondary | ICD-10-CM | POA: Diagnosis not present

## 2023-10-19 DIAGNOSIS — F418 Other specified anxiety disorders: Secondary | ICD-10-CM | POA: Diagnosis not present

## 2023-10-19 DIAGNOSIS — R2689 Other abnormalities of gait and mobility: Secondary | ICD-10-CM | POA: Insufficient documentation

## 2023-10-19 HISTORY — DX: Unspecified hearing loss, unspecified ear: H91.90

## 2023-10-19 HISTORY — PX: ORIF PATELLA: SHX5033

## 2023-10-19 LAB — BASIC METABOLIC PANEL WITH GFR
Anion gap: 11 (ref 5–15)
BUN: 13 mg/dL (ref 8–23)
CO2: 26 mmol/L (ref 22–32)
Calcium: 9.6 mg/dL (ref 8.9–10.3)
Chloride: 102 mmol/L (ref 98–111)
Creatinine, Ser: 0.54 mg/dL (ref 0.44–1.00)
GFR, Estimated: >60 mL/min (ref >60)
Glucose, Bld: 90 mg/dL (ref 70–99)
Potassium: 4.1 mmol/L (ref 3.5–5.1)
Sodium: 139 mmol/L (ref 135–145)

## 2023-10-19 SURGERY — OPEN REDUCTION INTERNAL FIXATION (ORIF) PATELLA
Anesthesia: General | Site: Knee | Laterality: Right

## 2023-10-19 MED ORDER — OMEGA-3-ACID ETHYL ESTERS 1 G PO CAPS
1000.0000 mg | ORAL_CAPSULE | Freq: Every day | ORAL | Status: DC
  Administered 2023-10-19: 1000 mg via ORAL
  Filled 2023-10-19: qty 1

## 2023-10-19 MED ORDER — METHOCARBAMOL 500 MG PO TABS
500.0000 mg | ORAL_TABLET | Freq: Four times a day (QID) | ORAL | Status: DC | PRN
  Administered 2023-10-19 – 2023-10-20 (×2): 500 mg via ORAL
  Filled 2023-10-19 (×2): qty 1

## 2023-10-19 MED ORDER — MIDAZOLAM HCL 2 MG/2ML IJ SOLN: 0.5000 mg | Freq: Once | INTRAMUSCULAR | Status: DC | PRN

## 2023-10-19 MED ORDER — CEFAZOLIN SODIUM-DEXTROSE 2-4 GM/100ML-% IV SOLN
2.0000 g | Freq: Four times a day (QID) | INTRAVENOUS | Status: AC
  Administered 2023-10-19 – 2023-10-20 (×2): 2 g via INTRAVENOUS
  Filled 2023-10-19 (×2): qty 100

## 2023-10-19 MED ORDER — ASPIRIN 81 MG PO TBEC
81.0000 mg | DELAYED_RELEASE_TABLET | Freq: Two times a day (BID) | ORAL | Status: DC
  Administered 2023-10-20: 81 mg via ORAL
  Filled 2023-10-19: qty 1

## 2023-10-19 MED ORDER — ONDANSETRON HCL 4 MG/2ML IJ SOLN
INTRAMUSCULAR | Status: DC | PRN
  Administered 2023-10-19: 4 mg via INTRAVENOUS

## 2023-10-19 MED ORDER — PROPOFOL 500 MG/50ML IV EMUL
INTRAVENOUS | Status: DC | PRN
Start: 2023-10-19 — End: 2023-10-19
  Administered 2023-10-19: 25 ug/kg/min via INTRAVENOUS

## 2023-10-19 MED ORDER — 0.9 % SODIUM CHLORIDE (POUR BTL) OPTIME
TOPICAL | Status: DC | PRN
  Administered 2023-10-19: 1000 mL

## 2023-10-19 MED ORDER — HYDROMORPHONE HCL 1 MG/ML IJ SOLN: 0.5000 mg | INTRAMUSCULAR | Status: DC | PRN

## 2023-10-19 MED ORDER — CLOPIDOGREL BISULFATE 75 MG PO TABS
75.0000 mg | ORAL_TABLET | Freq: Every day | ORAL | Status: DC
  Administered 2023-10-20: 75 mg via ORAL
  Filled 2023-10-19: qty 1

## 2023-10-19 MED ORDER — DEXAMETHASONE SODIUM PHOSPHATE 10 MG/ML IJ SOLN
INTRAMUSCULAR | Status: DC | PRN
  Administered 2023-10-19: 8 mg via INTRAVENOUS

## 2023-10-19 MED ORDER — OXYCODONE HCL 5 MG PO TABS
5.0000 mg | ORAL_TABLET | ORAL | Status: DC | PRN
  Filled 2023-10-19: qty 1

## 2023-10-19 MED ORDER — ORAL CARE MOUTH RINSE: 15.0000 mL | Freq: Once | OROMUCOSAL | Status: AC

## 2023-10-19 MED ORDER — PROPOFOL 10 MG/ML IV BOLUS
INTRAVENOUS | Status: DC | PRN
  Administered 2023-10-19: 100 mg via INTRAVENOUS

## 2023-10-19 MED ORDER — LOSARTAN POTASSIUM 25 MG PO TABS: 12.5000 mg | ORAL_TABLET | Freq: Every day | ORAL | Status: DC

## 2023-10-19 MED ORDER — LIDOCAINE HCL (CARDIAC) PF 100 MG/5ML IV SOSY
PREFILLED_SYRINGE | INTRAVENOUS | Status: DC | PRN
Start: 2023-10-19 — End: 2023-10-19
  Administered 2023-10-19: 20 mg via INTRAVENOUS

## 2023-10-19 MED ORDER — CLONAZEPAM 0.5 MG PO TABS
0.5000 mg | ORAL_TABLET | Freq: Two times a day (BID) | ORAL | Status: DC | PRN
  Administered 2023-10-20: 0.5 mg via ORAL
  Filled 2023-10-19: qty 1

## 2023-10-19 MED ORDER — HYDROMORPHONE HCL 1 MG/ML IJ SOLN: 0.2500 mg | INTRAMUSCULAR | Status: DC | PRN

## 2023-10-19 MED ORDER — METOCLOPRAMIDE HCL 5 MG/ML IJ SOLN: 5.0000 mg | Freq: Three times a day (TID) | INTRAMUSCULAR | Status: DC | PRN

## 2023-10-19 MED ORDER — VANCOMYCIN HCL IN DEXTROSE 1-5 GM/200ML-% IV SOLN
1000.0000 mg | Freq: Once | INTRAVENOUS | Status: AC
  Administered 2023-10-19: 1000 mg via INTRAVENOUS
  Filled 2023-10-19: qty 200

## 2023-10-19 MED ORDER — MIDAZOLAM HCL 2 MG/2ML IJ SOLN: 1.0000 mg | INTRAMUSCULAR | Status: DC

## 2023-10-19 MED ORDER — EPHEDRINE 5 MG/ML INJ
INTRAVENOUS | Status: AC
  Filled 2023-10-19: qty 5

## 2023-10-19 MED ORDER — METOCLOPRAMIDE HCL 5 MG PO TABS: 5.0000 mg | ORAL_TABLET | Freq: Three times a day (TID) | ORAL | Status: DC | PRN

## 2023-10-19 MED ORDER — MEPERIDINE HCL 100 MG/ML IJ SOLN: 6.2500 mg | INTRAMUSCULAR | Status: DC | PRN

## 2023-10-19 MED ORDER — METHOCARBAMOL 1000 MG/10ML IJ SOLN: 500.0000 mg | Freq: Four times a day (QID) | INTRAMUSCULAR | Status: DC | PRN

## 2023-10-19 MED ORDER — ACETAMINOPHEN 325 MG PO TABS
325.0000 mg | ORAL_TABLET | Freq: Four times a day (QID) | ORAL | Status: DC | PRN
  Administered 2023-10-19 – 2023-10-20 (×2): 650 mg via ORAL
  Filled 2023-10-19 (×2): qty 2

## 2023-10-19 MED ORDER — FENTANYL CITRATE PF 50 MCG/ML IJ SOSY
50.0000 ug | PREFILLED_SYRINGE | INTRAMUSCULAR | Status: DC
  Administered 2023-10-19: 100 ug via INTRAVENOUS
  Filled 2023-10-19: qty 2

## 2023-10-19 MED ORDER — ESCITALOPRAM OXALATE 10 MG PO TABS
10.0000 mg | ORAL_TABLET | Freq: Every day | ORAL | Status: DC
  Administered 2023-10-19: 10 mg via ORAL
  Filled 2023-10-19: qty 1

## 2023-10-19 MED ORDER — DOCUSATE SODIUM 100 MG PO CAPS
100.0000 mg | ORAL_CAPSULE | Freq: Two times a day (BID) | ORAL | Status: DC
  Filled 2023-10-19 (×2): qty 1

## 2023-10-19 MED ORDER — ACETAMINOPHEN 325 MG PO TABS: 650.0000 mg | ORAL_TABLET | Freq: Four times a day (QID) | ORAL | Status: DC | PRN

## 2023-10-19 MED ORDER — OXYCODONE HCL 5 MG PO TABS: 5.0000 mg | ORAL_TABLET | Freq: Once | ORAL | Status: DC | PRN

## 2023-10-19 MED ORDER — MAGNESIUM OXIDE -MG SUPPLEMENT 400 (240 MG) MG PO TABS
400.0000 mg | ORAL_TABLET | Freq: Every day | ORAL | Status: DC
  Administered 2023-10-20: 400 mg via ORAL
  Filled 2023-10-19: qty 1

## 2023-10-19 MED ORDER — ONDANSETRON HCL 4 MG PO TABS: 4.0000 mg | ORAL_TABLET | Freq: Four times a day (QID) | ORAL | Status: DC | PRN

## 2023-10-19 MED ORDER — LACTATED RINGERS IV SOLN: INTRAVENOUS | Status: DC

## 2023-10-19 MED ORDER — ONDANSETRON HCL 4 MG/2ML IJ SOLN: 4.0000 mg | Freq: Four times a day (QID) | INTRAMUSCULAR | Status: DC | PRN

## 2023-10-19 MED ORDER — SODIUM CHLORIDE 0.9 % IV SOLN: INTRAVENOUS | Status: DC

## 2023-10-19 MED ORDER — PHENYLEPHRINE HCL-NACL 20-0.9 MG/250ML-% IV SOLN
INTRAVENOUS | Status: DC | PRN
Start: 2023-10-19 — End: 2023-10-19
  Administered 2023-10-19: 25 ug/min via INTRAVENOUS

## 2023-10-19 MED ORDER — BUPIVACAINE-EPINEPHRINE (PF) 0.5% -1:200000 IJ SOLN
INTRAMUSCULAR | Status: DC | PRN
Start: 2023-10-19 — End: 2023-10-19
  Administered 2023-10-19: 20 mL via PERINEURAL

## 2023-10-19 MED ORDER — CHLORHEXIDINE GLUCONATE 0.12 % MT SOLN
15.0000 mL | Freq: Once | OROMUCOSAL | Status: AC
  Administered 2023-10-19: 15 mL via OROMUCOSAL

## 2023-10-19 MED ORDER — OXYCODONE HCL 5 MG/5ML PO SOLN: 5.0000 mg | Freq: Once | ORAL | Status: DC | PRN

## 2023-10-19 MED ORDER — ROSUVASTATIN CALCIUM 20 MG PO TABS
20.0000 mg | ORAL_TABLET | Freq: Every day | ORAL | Status: DC
  Administered 2023-10-19: 20 mg via ORAL
  Filled 2023-10-19 (×2): qty 1

## 2023-10-19 MED ORDER — EPHEDRINE SULFATE (PRESSORS) 50 MG/ML IJ SOLN
INTRAMUSCULAR | Status: DC | PRN
  Administered 2023-10-19: 10 mg via INTRAVENOUS

## 2023-10-19 SURGICAL SUPPLY — 36 items
BAG COUNTER SPONGE SURGICOUNT (BAG) IMPLANT
BAG ZIPLOCK 12X15 (MISCELLANEOUS) ×2 IMPLANT
BIT DRILL 2.6 CANN (BIT) IMPLANT
BNDG ELASTIC 4X5.8 VLCR NS LF (GAUZE/BANDAGES/DRESSINGS) IMPLANT
BNDG GAUZE DERMACEA FLUFF 4 (GAUZE/BANDAGES/DRESSINGS) ×4 IMPLANT
COVER SURGICAL LIGHT HANDLE (MISCELLANEOUS) ×2 IMPLANT
CUFF TRNQT CYL 34X4.125X (TOURNIQUET CUFF) ×2 IMPLANT
DRAPE C-ARM 42X120 X-RAY (DRAPES) ×2 IMPLANT
DRAPE U-SHAPE 47X51 STRL (DRAPES) ×2 IMPLANT
DRSG ADAPTIC 3X8 NADH LF (GAUZE/BANDAGES/DRESSINGS) ×2 IMPLANT
DRSG EMULSION OIL 3X16 NADH (GAUZE/BANDAGES/DRESSINGS) IMPLANT
DURAPREP 26ML APPLICATOR (WOUND CARE) ×2 IMPLANT
ELECT REM PT RETURN 15FT ADLT (MISCELLANEOUS) ×2 IMPLANT
FIBERTAPE 2 W/STRL NDL 17 (SUTURE) IMPLANT
GAUZE PAD ABD 8X10 STRL (GAUZE/BANDAGES/DRESSINGS) ×2 IMPLANT
GAUZE SPONGE 4X4 12PLY STRL (GAUZE/BANDAGES/DRESSINGS) IMPLANT
GLOVE BIOGEL PI IND STRL 7.5 (GLOVE) ×2 IMPLANT
GLOVE BIOGEL PI IND STRL 8.5 (GLOVE) ×2 IMPLANT
GLOVE ORTHO TXT STRL SZ7.5 (GLOVE) ×2 IMPLANT
GLOVE SURG ORTHO 8.5 STRL (GLOVE) ×2 IMPLANT
GUIDEWIRE 1.35MM DUAL TROCAR (WIRE) IMPLANT
IMMOBILIZER KNEE 20 THIGH 36 (SOFTGOODS) ×2 IMPLANT
KIT TURNOVER KIT A (KITS) ×2 IMPLANT
MANIFOLD NEPTUNE II (INSTRUMENTS) ×2 IMPLANT
PACK ORTHO EXTREMITY (CUSTOM PROCEDURE TRAY) ×2 IMPLANT
PADDING CAST SYNTHETIC 6X4 NS (CAST SUPPLIES) ×4 IMPLANT
PENCIL SMOKE EVACUATOR (MISCELLANEOUS) IMPLANT
PROTECTOR NERVE ULNAR (MISCELLANEOUS) ×2 IMPLANT
SCREW 4X36MM CANN LO-PRO (Screw) IMPLANT
SCREW CANN 4X34 LO PRO (Screw) IMPLANT
STAPLER SKIN PROX 35W (STAPLE) ×2 IMPLANT
SUT VIC AB 0 CT1 36 (SUTURE) ×2 IMPLANT
SUT VIC AB 1 CT1 27XBRD ANTBC (SUTURE) ×8 IMPLANT
SUT VIC AB 2-0 CT1 TAPERPNT 27 (SUTURE) ×4 IMPLANT
SUTURE FIBERWR #2 38 T-5 BLUE (SUTURE) ×6 IMPLANT
TOWEL OR 17X26 10 PK STRL BLUE (TOWEL DISPOSABLE) ×4 IMPLANT

## 2023-10-19 NOTE — Brief Op Note (Signed)
 10/19/2023  5:52 PM  PATIENT:  Alicia Wilkerson  73 y.o. female  PRE-OPERATIVE DIAGNOSIS:  Closed displaced comminuted fracture of right patella, initial encounter  POST-OPERATIVE DIAGNOSIS:  Closed displaced comminuted fracture of right patella, initial encounter  PROCEDURE:  Procedure(s): OPEN REDUCTION INTERNAL FIXATION (ORIF) PATELLA (Right) using Athrex 4.0 smooth cannulated screws and fibertape tension band technique  SURGEON:  Surgeons and Role:    DEWAINE Kay Kemps, MD - Primary  PHYSICIAN ASSISTANT:   ASSISTANTS: Debby KATHEE Fireman, PA-C   ANESTHESIA:   regional and general  EBL:  minimal  BLOOD ADMINISTERED:none  DRAINS: none   LOCAL MEDICATIONS USED:  NONE  SPECIMEN:  No Specimen  DISPOSITION OF SPECIMEN:  N/A  COUNTS:  YES  TOURNIQUET:   Total Tourniquet Time Documented: Thigh (laterality) - 48 minutes Total: Thigh (laterality) - 48 minutes   DICTATION: .Other Dictation: Dictation Number [id number]  PLAN OF CARE: Admit for overnight observation  PATIENT DISPOSITION:  PACU - hemodynamically stable.   Delay start of Pharmacological VTE agent (>24hrs) due to surgical blood loss or risk of bleeding: no

## 2023-10-19 NOTE — Anesthesia Postprocedure Evaluation (Signed)
 Anesthesia Post Note  Patient: Alicia Wilkerson  Procedure(s) Performed: OPEN REDUCTION INTERNAL FIXATION (ORIF) PATELLA (Right: Knee)     Patient location during evaluation: PACU Anesthesia Type: General Level of consciousness: awake and alert, oriented and patient cooperative Pain management: pain level controlled Vital Signs Assessment: post-procedure vital signs reviewed and stable Respiratory status: spontaneous breathing, nonlabored ventilation and respiratory function stable Cardiovascular status: blood pressure returned to baseline and stable Postop Assessment: no apparent nausea or vomiting Anesthetic complications: no   No notable events documented.  Last Vitals:  Vitals:   10/19/23 1815 10/19/23 1833  BP: (!) 160/76 (!) 158/70  Pulse: 70 64  Resp: 16 18  Temp:  36.5 C  SpO2: 95% 98%    Last Pain:  Vitals:   10/19/23 1833  TempSrc: Oral  PainSc: 0-No pain                 Josemiguel Gries,E. Taye Cato

## 2023-10-19 NOTE — Discharge Instructions (Signed)
 Ice to the knee constantly.  Keep the incision covered and clean and dry for one week, then ok to remove the bandages and to get it wet in the shower. Must keep the knee brace on at all times and sit when showering with the knee straight. Do not bend your knee  Do ankle exercise as instructed every hour, for circulation.   Use the walker while you are up and around for balance.   Take the 81 mg aspirin  TWICE daily for 30 days in addition to your plavix  to prevent blood clots and wear compression hose  Follow up with Dr Kay in two weeks in the office, call [phone] for appt

## 2023-10-19 NOTE — Anesthesia Preprocedure Evaluation (Addendum)
 Anesthesia Evaluation  Patient identified by MRN, date of birth, ID band Patient awake    Reviewed: Allergy & Precautions, NPO status , Patient's Chart, lab work & pertinent test results  History of Anesthesia Complications (+) PONVNegative for: history of anesthetic complications  Airway Mallampati: II  TM Distance: >3 FB Neck ROM: Full    Dental  (+) Dental Advisory Given   Pulmonary neg pulmonary ROS   breath sounds clear to auscultation       Cardiovascular hypertension, Pt. on medications + CAD (calcification LAD on coronary CTA)  + dysrhythmias Supra Ventricular Tachycardia  Rhythm:Regular Rate:Normal  07/2023 ECHO: EF 60 to 65%. 1. The LV has normal function, no regional wall motion abnormalities. There is mild LVH. Grade I diastolic dysfunction (impaired relaxation).  Consider evaluation for infiltrative cardiomyopathy. (I.E HCM, amyloid)   2. RVF is normal. The right ventricular size is normal. Mildly increased right ventricular wall thickness.   3. Left atrial size was moderately dilated.   4. Small to moderate sized pericardial effusion. No signs of tamponade.   5. The mitral valve is normal in structure. No evidence of mitral valve regurgitation. No evidence of mitral stenosis.   6. The aortic valve is normal in structure. Aortic valve regurgitation is not visualized. No aortic stenosis is present.     Neuro/Psych   Anxiety Depression    CVA, No Residual Symptoms    GI/Hepatic negative GI ROS, Neg liver ROS,,,  Endo/Other  negative endocrine ROS    Renal/GU negative Renal ROS     Musculoskeletal   Abdominal   Peds  Hematology plavix    Anesthesia Other Findings   Reproductive/Obstetrics                              Anesthesia Physical Anesthesia Plan  ASA: 3  Anesthesia Plan: General   Post-op Pain Management: Regional block* and Ofirmev  IV (intra-op)*   Induction:  Intravenous  PONV Risk Score and Plan: 4 or greater and Treatment may vary due to age or medical condition, Ondansetron  and Dexamethasone   Airway Management Planned: LMA  Additional Equipment: None  Intra-op Plan:   Post-operative Plan:   Informed Consent: I have reviewed the patients History and Physical, chart, labs and discussed the procedure including the risks, benefits and alternatives for the proposed anesthesia with the patient or authorized representative who has indicated his/her understanding and acceptance.     Dental advisory given  Plan Discussed with: CRNA and Surgeon  Anesthesia Plan Comments: (Discussed with patient and her son Plan GA with femoral nerve block for post op analgesia)         Anesthesia Quick Evaluation

## 2023-10-19 NOTE — Anesthesia Procedure Notes (Addendum)
 Anesthesia Regional Block: Femoral nerve block   Pre-Anesthetic Checklist: , timeout performed,  Correct Patient, Correct Site, Correct Laterality,  Correct Procedure, Correct Position, site marked,  Risks and benefits discussed,  Surgical consent,  Pre-op evaluation,  At surgeon's request and post-op pain management  Laterality: Right and Lower  Prep: chloraprep       Needles:  Injection technique: Single-shot  Needle Type: Echogenic Needle     Needle Length: 9cm  Needle Gauge: 21     Additional Needles:   Procedures:,,,, ultrasound used (permanent image in chart),,    Narrative:  Start time: 10/19/2023 4:07 PM End time: 10/19/2023 4:13 PM Injection made incrementally with aspirations every 5 mL.  Performed by: Personally  Anesthesiologist: Leonce Athens, MD  Additional Notes: Pt identified in Holding room.  Monitors applied. Working IV access confirmed. Sterile prep R inguinal crease.  #21ga ECHOgenic Arrow block needle to femoral nerve with US  guidance.  20cc 0.5% Bupivacaine  1:200k epi injected incrementally after negative test dose.  Patient asymptomatic, VSS, no heme aspirated, tolerated well.   Alicia Wilkerson Leonce, MD

## 2023-10-19 NOTE — Transfer of Care (Signed)
 Immediate Anesthesia Transfer of Care Note  Patient: Alicia Wilkerson  Procedure(s) Performed: Procedure(s): OPEN REDUCTION INTERNAL FIXATION (ORIF) PATELLA (Right)  Patient Location: PACU  Anesthesia Type:General  Level of Consciousness:  sedated, patient cooperative and responds to stimulation  Airway & Oxygen Therapy:Patient Spontanous Breathing and Patient connected to face mask oxgen  Post-op Assessment:  Report given to PACU RN and Post -op Vital signs reviewed and stable  Post vital signs:  Reviewed and stable  Last Vitals:  Vitals:   10/19/23 1623 10/19/23 1624  BP:    Pulse: 61 66  Resp: 12   Temp:    SpO2: 98% 96%    Complications: No apparent anesthesia complications

## 2023-10-19 NOTE — Anesthesia Procedure Notes (Signed)
 Procedure Name: LMA Insertion Date/Time: 10/19/2023 4:55 PM  Performed by: Vincenzo Show, CRNAPre-anesthesia Checklist: Patient identified, Emergency Drugs available, Suction available, Patient being monitored and Timeout performed Patient Re-evaluated:Patient Re-evaluated prior to induction Oxygen Delivery Method: Circle system utilized Preoxygenation: Pre-oxygenation with 100% oxygen Induction Type: IV induction Ventilation: Mask ventilation without difficulty LMA: LMA inserted LMA Size: 4.0 Number of attempts: 1 Placement Confirmation: positive ETCO2 and breath sounds checked- equal and bilateral Tube secured with: Tape Dental Injury: Teeth and Oropharynx as per pre-operative assessment

## 2023-10-19 NOTE — Interval H&P Note (Signed)
 History and Physical Interval Note:  10/19/2023 3:46 PM  Alicia Wilkerson  has presented today for surgery, with the diagnosis of Closed displaced comminuted fracture of right patella, initial encounter.  The various methods of treatment have been discussed with the patient and family. After consideration of risks, benefits and other options for treatment, the patient has consented to  Procedure(s): OPEN REDUCTION INTERNAL FIXATION (ORIF) PATELLA (Right) as a surgical intervention.  The patient's history has been reviewed, patient examined, no change in status, stable for surgery.  I have reviewed the patient's chart and labs.  Questions were answered to the patient's satisfaction.     [name]

## 2023-10-19 NOTE — Op Note (Unsigned)
 Alicia Wilkerson, Alicia Wilkerson MEDICAL RECORD NO: [id number] ACCOUNT NO: [id number] DATE OF BIRTH: [birth year] FACILITY: [facility] LOCATION: [id number] PHYSICIAN: Elspeth SAUNDERS. Kay, MD  Operative Report   DATE OF PROCEDURE: 10/19/2023  PREOPERATIVE DIAGNOSES:  Right displaced and comminuted closed patella fracture.  POSTOPERATIVE DIAGNOSES:  Right displaced and comminuted closed patella fracture.  PROCEDURE PERFORMED:  Open reduction and internal fixation of right patella fracture utilizing Arthrex 4.0 smooth cannulated screws with FiberTape tension band technique.  ATTENDING SURGEON:  Elspeth SAUNDERS. Kay, MD  ASSISTANT:  Debby Crock [place], [place], who was scrubbed during the entire procedure, and necessary for satisfactory completion of surgery.  ANESTHESIA:  General anesthesia was used plus adductor canal block.  ESTIMATED BLOOD LOSS:  Minimal.  FLUID REPLACEMENT:  1000 mL of crystalloid.  INSTRUMENT COUNTS:  Correct.  COMPLICATIONS:  There were no complications.  ANTIBIOTICS:  Perioperative antibiotics were given.  TOURNIQUET TIME:  48 minutes at 250 mmHg.  INDICATIONS:  The patient is a 73 year old female who suffered a displaced patella fracture that was comminuted.  Initial x-rays in the ER showed slight displacement.  Follow-up x-rays a week later in our office showed increasing displacement, especially  the superficial cortex anteriorly was displacing at least 5 mm.  We discussed the concern over the unstable fracture and the fact that the patient is trying to ambulate on the leg and recommended ORIF using a screw tension band construct.  Risks  including but not limited to infection, deep vein thrombosis, and pulmonary embolism discussed with the patient.  Informed consent was obtained.  DESCRIPTION OF PROCEDURE:  After an adequate level of anesthesia was achieved, the patient was positioned in the supine position.  A nonsterile tourniquet was placed on the right proximal thigh.  Sterile prep  and drape of the right leg was performed.   Timeout was called verifying the correct patient and correct site.  We elevated the leg and exsanguinated with an Esmarch bandage and inflated the tourniquet to 250 mmHg.  We then performed a longitudinal midline incision, dissection down through  subcutaneous tissues.  We identified the fractured patella, and I was able to use a parapatellar arthrotomy that was in line with the patella fracture.  We just extended that so I could get my finger up under the fractured patella, aligned the fragment,  and then used large towel clips to compress the fracture together.  We brought C-arm in to visualize our reduction.  Once we were pleased with that, we placed two cannulated guidewires for our 4.0 Arthrex screws.  These were cannulated titanium screws.   Once we had those guidewires placed and our depth measured, we placed two of those 4.0 cannulated screws perpendicular to the fracture site.  We had decent compression but very poor screw purchase due to the osteoporotic bone.  We then placed FiberTape  in a tension band technique through the screws tying over the top of the patella with the FiberTape knot being distal and up against one of the screw holes.  We had very good compression obliterating the fracture site, which we really could not even see  at all on the x-ray once we had the tension band tied.  We then went ahead and did side-to-side repair of the parapatellar arthrotomy with the #2 FiberWire and also used some 0 Vicryl suture around the inline incisions into the patellar tendon and  quadriceps tendon to retrieve our guidewires and also to get the screw in.  We had a really nice repair.  We were able to range the knee.  No gapping at all through 45 degrees of flexion, and x-rays post repair looked really good.  We irrigated  thoroughly.  We then went ahead, and there was a little bit of gravel that was in the subcutaneous tissues that we removed.  We  irrigated thoroughly.  There were no signs of any active infection.  We closed with layered closure with 2-0 Vicryl and then  staples for skin.  A sterile dressing and knee immobilizer were applied.  The patient was transferred to the recovery room in stable condition.   PUS D: 10/19/2023 5:58:42 pm T: 10/19/2023 10:26:00 pm  JOB: [id number]/ [id number]

## 2023-10-20 ENCOUNTER — Encounter (HOSPITAL_COMMUNITY): Payer: Self-pay | Admitting: Orthopedic Surgery

## 2023-10-20 ENCOUNTER — Other Ambulatory Visit: Payer: Self-pay | Admitting: Internal Medicine

## 2023-10-20 ENCOUNTER — Other Ambulatory Visit: Payer: Self-pay

## 2023-10-20 DIAGNOSIS — S82041A Displaced comminuted fracture of right patella, initial encounter for closed fracture: Secondary | ICD-10-CM | POA: Diagnosis not present

## 2023-10-20 MED ORDER — ASPIRIN 81 MG PO TBEC: 81.0000 mg | DELAYED_RELEASE_TABLET | Freq: Two times a day (BID) | ORAL | 0 refills | Status: DC

## 2023-10-20 NOTE — Plan of Care (Signed)
   Problem: Education: Goal: Knowledge of General Education information will improve Description: Including pain rating scale, medication(s)/side effects and non-pharmacologic comfort measures Outcome: Progressing   Problem: Pain Managment: Goal: General experience of comfort will improve and/or be controlled Outcome: Progressing   Problem: Safety: Goal: Ability to remain free from injury will improve Outcome: Progressing

## 2023-10-20 NOTE — Evaluation (Signed)
 Physical Therapy Evaluation Patient Details Name: Alicia Wilkerson MRN: [id number] DOB: [birth year] Today's Date: 10/20/2023  History of Present Illness  Pt is a 73 y/o female admitted 10/19/23 after a fall at home(10/06/23). She is now s/p ORIF R Patella 10/19/23. PMH includes: Anxiety disorder, CAD, CIN II (cervical intraepithelial neoplasia II), Depression, HOH, History of basal cell carcinoma, History of cervical dysplasia (2013), History of hereditary spherocytosis, History of hyperparathyroidism, History of pelvic fracture (2013), Ischemic stroke (08/07/2023), Left inguinal hernia (01/02/2023), MAI (mycobacterium avium-intracellulare) (HCC), Mild hyperlipidemia, Osteoporosis, S/P hernia repair (03/02/2023), S/P splenectomy.  Clinical Impression  Pt admitted with above diagnosis.  Pt reports ind at her baseline, drives, lives alone; son is here from [facility], her 2 other children also live out of town.  Pt is able to amb ~ 200' with RW, intermittent cues for safety;  demonstrates some carryover with improved safety awareness overall compared to OT session. Will see again in pm to reviewed stairs and reinforce safety awareness. Pt has a strong hx of falls.   Pt currently with functional limitations due to the deficits listed below (see PT Problem List). Pt will benefit from acute skilled PT to increase their independence and safety with mobility to allow discharge.           If plan is discharge home, recommend the following: A little help with bathing/dressing/bathroom;Help with stairs or ramp for entrance;Assist for transportation   Can travel by private vehicle        Equipment Recommendations None recommended by PT  Recommendations for Other Services       Functional Status Assessment Patient has had a recent decline in their functional status and demonstrates the ability to make significant improvements in function in a reasonable and predictable amount of time.     Precautions /  Restrictions Precautions Precautions: Fall Recall of Precautions/Restrictions: Intact Required Braces or Orthoses: Knee Immobilizer - Right Knee Immobilizer - Right: On at all times Restrictions RLE Weight Bearing Per Provider Order: Weight bearing as tolerated Other Position/Activity Restrictions: NO ROM R KNEE      Mobility  Bed Mobility Overal bed mobility: Needs Assistance Bed Mobility: Sit to Supine       Sit to supine: Supervision   General bed mobility comments: pt able to use gait belt as leg lifter    Transfers Overall transfer level: Needs assistance Equipment used: Rolling walker (2 wheels) Transfers: Sit to/from Stand Sit to Stand: Contact guard assist, Supervision           General transfer comment: pt able to demonstrate proper hand placement and RLE position with STS    Ambulation/Gait Ambulation/Gait assistance: Contact guard assist, Supervision Gait Distance (Feet): 200 Feet   Gait Pattern/deviations: Step-to pattern Gait velocity: decr but functional     General Gait Details: CGA with fade to supervision, steady gait with RW, no overt LOB; pt able to return demonstrate safe RW position with fwd motion and turns  Careers information officer     Tilt Bed    Modified Rankin (Stroke Patients Only)       Balance Overall balance assessment: History of Falls Sitting-balance support: No upper extremity supported, Feet supported Sitting balance-Leahy Scale: Good Sitting balance - Comments: able to wt shift R/L and fwd to self assist RLE position   Standing balance support: During functional activity, Reliant on assistive device for balance Standing balance-Leahy Scale: Fair Standing balance comment: pt is  able to static stand without UE support                             Pertinent Vitals/Pain Pain Assessment Pain Assessment: Faces Faces Pain Scale: Hurts little more Pain Location: posterior R knee Pain  Descriptors / Indicators: Sharp Pain Intervention(s): Limited activity within patient's tolerance, Monitored during session, Patient requesting pain meds-RN notified, RN gave pain meds during session    Home Living Family/patient expects to be discharged to:: Private residence Living Arrangements: Alone (cat named Chewy) Available Help at Discharge: Family;Available PRN/intermittently Type of Home: House Home Access: Stairs to enter Entrance Stairs-Rails: Doctor, general practice of Steps: 4 not as steep in front, 3 steep on side Alternate Level Stairs-Number of Steps: flight Home Layout: Two level;Bed/bath upstairs;1/2 bath on main level Home Equipment: Rolling Walker (2 wheels);Cane - single point;BSC/3in1;Wheelchair - manual;Tub bench      Prior Function Prior Level of Function : Independent/Modified Independent;Driving             Mobility Comments: was using no DME or SPC ADLs Comments: daughter delivers food via [employer] (dtger in [place]) Pt manages own meds and finances and was driving again     Extremity/Trunk Assessment   Upper Extremity Assessment Upper Extremity Assessment: Overall WFL for tasks assessed;Defer to OT evaluation    Lower Extremity Assessment Lower Extremity Assessment: RLE deficits/detail RLE Deficits / Details: ankle WFL; able to assist wtih SLR with KI in place RLE: Unable to fully assess due to immobilization RLE Coordination: decreased gross motor    Cervical / Trunk Assessment Cervical / Trunk Assessment: Normal  Communication   Communication Communication: No apparent difficulties Factors Affecting Communication: Hearing impaired (hearing aides)    Cognition Arousal: Alert     PT - Cognitive impairments: No apparent impairments                       PT - Cognition Comments: OT reports pt impulsive; pt noted to demonstrate carryover with improved safety awareness during PT session Following commands: Intact        Cueing Cueing Techniques: Verbal cues, Tactile cues     General Comments General comments (skin integrity, edema, etc.): son present throughout and supportive. But he lives in [place].    Exercises     Assessment/Plan    PT Assessment Patient needs continued PT services  PT Problem List Decreased mobility;Decreased safety awareness;Decreased activity tolerance;Decreased balance;Decreased knowledge of use of DME;Decreased knowledge of precautions       PT Treatment Interventions DME instruction;Therapeutic exercise;Gait training;Functional mobility training;Therapeutic activities;Patient/family education;Stair training;Balance training    PT Goals (Current goals can be found in the Care Plan section)  Acute Rehab PT Goals PT Goal Formulation: With patient Time For Goal Achievement: 10/27/23 Potential to Achieve Goals: Good    Frequency Min 6X/week     Co-evaluation               AM-PAC PT 6 Clicks Mobility  Outcome Measure Help needed turning from your back to your side while in a flat bed without using bedrails?: A Little Help needed moving from lying on your back to sitting on the side of a flat bed without using bedrails?: A Little Help needed moving to and from a bed to a chair (including a wheelchair)?: A Little Help needed standing up from a chair using your arms (e.g., wheelchair or bedside chair)?: A Little Help needed  to walk in hospital room?: A Little Help needed climbing 3-5 steps with a railing? : A Little 6 Click Score: 18    End of Session Equipment Utilized During Treatment: Gait belt Activity Tolerance: Patient tolerated treatment well Patient left: with call bell/phone within reach;in bed;with bed alarm set;with family/visitor present Nurse Communication: Mobility status PT Visit Diagnosis: Other abnormalities of gait and mobility (R26.89)    Time: 8891-8871 PT Time Calculation (min) (ACUTE ONLY): 20 min   Charges:   PT Evaluation $PT Eval Low  Complexity: 1 Low   PT General Charges $$ ACUTE PT VISIT: 1 Visit         Shad Ledvina, PT  Acute Rehab Dept ([facility]) [phone]  10/20/2023   [facility] 10/20/2023, 12:24 PM

## 2023-10-20 NOTE — TOC Progression Note (Signed)
 Transition of Care [facility]) - Progression Note    Patient Details  Name: Alicia Wilkerson MRN: [id number] Date of Birth: 05/12/1950  [facility]) CM/SW Contact  NORMAN ASPEN, LCSW Phone Number: 10/20/2023, 3:23 PM  Clinical Narrative:     Met with pt and son at length today to review their concerns about pt's home discharge.  Citing recent CVA and concerns about new mobility limitations due to patella fx.  Pt does not have access to a bed downstairs and requesting hospital bed to ensure better bed mobility/ safety.  Also, asking about HHPT and aide services.  Have reached out to MD about these requests and await response/ agreement to place orders.   Pt and son are hopeful to dc today but I have explained I cannot place referrals without MD orders.  If they choose to go home today, I have agreed to follow up with pt/son by phone once I have heard back from MD and if I can proceed with ordering services and item requested.  Son confirms that he will be staying with pt a few more days before returning to his home in [place] .  I have, also, provided pt and son with handouts on local private duty agencies and additional agencies for senior supports such as transportation.                      Expected Discharge Plan and Services         Expected Discharge Date: 10/20/23                                     Social Drivers of Health (SDOH) Interventions SDOH Screenings   Food Insecurity: No Food Insecurity (10/19/2023)  Housing: Low Risk  (10/19/2023)  Transportation Needs: No Transportation Needs (10/19/2023)  Utilities: Not At Risk (10/19/2023)  Alcohol Screen: Low Risk  (05/01/2023)  Depression (PHQ2-9): Low Risk  (09/11/2023)  Financial Resource Strain: Low Risk  (05/01/2023)  Physical Activity: Insufficiently Active (05/01/2023)  Social Connections: Socially Isolated (10/19/2023)  Stress: Stress Concern Present (05/01/2023)  Tobacco Use: Low Risk  (10/19/2023)  Health  Literacy: Adequate Health Literacy (05/01/2023)    Readmission Risk Interventions    08/13/2023   10:02 AM  Readmission Risk Prevention Plan  Medication Screening Complete  Transportation Screening Complete

## 2023-10-20 NOTE — Progress Notes (Signed)
 Orthopedics Progress Note  Subjective: Patient reports sleeping well. I have no pain  Objective:  Vitals:   10/20/23 0125 10/20/23 0506  BP: 129/68 135/77  Pulse: 64 63  Resp: 18 18  Temp: 97.8 F (36.6 C) 98.3 F (36.8 C)  SpO2: 96% 95%    General: Awake and alert  Musculoskeletal: Right knee dressing intact. No pain with calf pumps Neurovascularly intact  Lab Results  Component Value Date   WBC 8.8 08/14/2023   HGB 11.7 (L) 08/14/2023   HCT 33.7 (L) 08/14/2023   MCV 100.6 (H) 08/14/2023   PLT 306 08/14/2023       Component Value Date/Time   NA 139 10/19/2023 1425   NA 139 08/21/2016 0939   K 4.1 10/19/2023 1425   CL 102 10/19/2023 1425   CO2 26 10/19/2023 1425   GLUCOSE 90 10/19/2023 1425   BUN 13 10/19/2023 1425   BUN 17 08/21/2016 0939   CREATININE 0.54 10/19/2023 1425   CREATININE 0.71 11/04/2019 0955   CALCIUM  9.6 10/19/2023 1425   CALCIUM  9.7 06/17/2012 0906   GFRNONAA >60 10/19/2023 1425   GFRAA >60 10/04/2019 1436    Lab Results  Component Value Date   INR 1.0 08/09/2023    Assessment/Plan: POD #1 s/p Procedure(s): OPEN REDUCTION INTERNAL FIXATION (ORIF) PATELLA Up with therapy today. Patient has steps and stairs at the house. Knee immobilizer must remain on at all times.  D/C planning with social work for home hospital bed and aide Follow up in two weeks in the office  Elspeth R. Kay, MD 10/20/2023 7:47 AM

## 2023-10-20 NOTE — Progress Notes (Signed)
 Physical Therapy Treatment Patient Details Name: Alicia Wilkerson MRN: [id number] DOB: 29-Jul-1950 Today's Date: 10/20/2023   History of Present Illness Pt is a 73 y/o female admitted 10/19/23 after a fall at home(10/06/23). She is now s/p ORIF R Patella 10/19/23. PMH includes: Anxiety disorder, CAD, CIN II (cervical intraepithelial neoplasia II), Depression, HOH, History of basal cell carcinoma, History of cervical dysplasia (2013), History of hereditary spherocytosis, History of hyperparathyroidism, History of pelvic fracture (2013), Ischemic stroke (08/07/2023), Left inguinal hernia (01/02/2023), MAI (mycobacterium avium-intracellulare) (HCC), Mild hyperlipidemia, Osteoporosis, S/P hernia repair (03/02/2023), S/P splenectomy.    PT Comments  Pt progressing this session; ongoing education on consistent use of RW, slowing speed of movements for fall prevention and adhering to use of KI/no R knee ROM. Encouraged pt to stay on first level of home if possible and discussed that going upstairs may increase the possibility of having a fall. Mobility as below, pt is ready to d/c with son assisting prn from PT standpoint. Pt is at risk for falls given hx, occasional impulsivity, decr attention to task.   Pt son, Alm states he can stay for ~ 1 wk but then will have to return home to [facility]. Son present for session. Pt and son inquiring about hospital bed, aide and HHPT--defer to [facility] and final decision per MD.     If plan is discharge home, recommend the following: A little help with bathing/dressing/bathroom;Help with stairs or ramp for entrance;Assist for transportation   Can travel by private vehicle        Equipment Recommendations  None recommended by PT    Recommendations for Other Services       Precautions / Restrictions Precautions Precautions: Fall Recall of Precautions/Restrictions: Intact Precaution/Restrictions Comments: pt recalls no knee ROM and consistent use of KI; Required Braces or  Orthoses: Knee Immobilizer - Right Knee Immobilizer - Right: On at all times Restrictions Weight Bearing Restrictions Per Provider Order: No RLE Weight Bearing Per Provider Order: Weight bearing as tolerated Other Position/Activity Restrictions: NO ROM R KNEE     Mobility  Bed Mobility Overal bed mobility: Needs Assistance Bed Mobility: Supine to Sit     Supine to sit: Supervision Sit to supine: Supervision   General bed mobility comments: pt able to use gait belt as leg lifter/to lower RLE to floor    Transfers Overall transfer level: Needs assistance Equipment used: Rolling walker (2 wheels) Transfers: Sit to/from Stand Sit to Stand: Supervision           General transfer comment: pt able to demonstrate proper hand placement and RLE position with STS    Ambulation/Gait Ambulation/Gait assistance: Supervision Gait Distance (Feet): 180 Feet Assistive device: Rolling walker (2 wheels) Gait Pattern/deviations: Step-to pattern Gait velocity: decr but functional     General Gait Details: supervision for  safety, steady gait with RW, no overt LOB; pt able to return demonstrate safe RW position with fwd motion and turns; reinforced use of RW and not cane at home   Stairs Stairs: Yes Stairs assistance: Supervision, Contact guard assist Stair Management: One rail Right, Step to pattern, With cane, Forwards Number of Stairs:  (up 2 down 3) General stair comments: education provided on correct technique, pt verbalizes technique from prior injuries/rehab. pt is able to ascend/descend stairs with close supervision for safety, no LOB. son present   Wheelchair Mobility     Tilt Bed    Modified Rankin (Stroke Patients Only)       Balance Overall balance  assessment: History of Falls Sitting-balance support: No upper extremity supported, Feet supported Sitting balance-Leahy Scale: Good Sitting balance - Comments: able to wt shift R/L and fwd to self assist RLE  position   Standing balance support: During functional activity, Reliant on assistive device for balance Standing balance-Leahy Scale: Fair Standing balance comment: pt is able to static stand without UE support                            Communication Communication Communication: No apparent difficulties Factors Affecting Communication: Hearing impaired (hearing aides)  Cognition Arousal: Alert Behavior During Therapy: WFL for tasks assessed/performed   PT - Cognitive impairments: No apparent impairments                       PT - Cognition Comments: easily distracted Following commands: Intact      Cueing Cueing Techniques: Verbal cues, Tactile cues  Exercises      General Comments        Pertinent Vitals/Pain Pain Assessment Pain Assessment: Faces Faces Pain Scale: Hurts a little bit Pain Location: R knee after activity Pain Descriptors / Indicators: Aching Pain Intervention(s): Limited activity within patient's tolerance, Monitored during session, Premedicated before session, Repositioned    Home Living Family/patient expects to be discharged to:: Private residence Living Arrangements: Alone (cat named Chewy) Available Help at Discharge: Family;Available PRN/intermittently Type of Home: House Home Access: Stairs to enter Entrance Stairs-Rails: Doctor, general practice of Steps: 4 not as steep in front, 3 steep on side Alternate Level Stairs-Number of Steps: flight Home Layout: Two level;Bed/bath upstairs;1/2 bath on main level Home Equipment: Rolling Walker (2 wheels);Cane - single point;BSC/3in1;Wheelchair - manual;Tub bench      Prior Function            PT Goals (current goals can now be found in the care plan section) Acute Rehab PT Goals PT Goal Formulation: With patient Time For Goal Achievement: 10/27/23 Potential to Achieve Goals: Good Progress towards PT goals: Progressing toward goals    Frequency    Min  6X/week      PT Plan      Co-evaluation              AM-PAC PT 6 Clicks Mobility   Outcome Measure  Help needed turning from your back to your side while in a flat bed without using bedrails?: A Little Help needed moving from lying on your back to sitting on the side of a flat bed without using bedrails?: A Little Help needed moving to and from a bed to a chair (including a wheelchair)?: A Little Help needed standing up from a chair using your arms (e.g., wheelchair or bedside chair)?: A Little Help needed to walk in hospital room?: A Little Help needed climbing 3-5 steps with a railing? : A Little 6 Click Score: 18    End of Session Equipment Utilized During Treatment: Gait belt Activity Tolerance: Patient tolerated treatment well Patient left: in chair;with call bell/phone within reach;with chair alarm set;with family/visitor present Nurse Communication: Mobility status PT Visit Diagnosis: Other abnormalities of gait and mobility (R26.89)     Time: 8555-8491 PT Time Calculation (min) (ACUTE ONLY): 24 min  Charges:    $Gait Training: 23-37 mins PT General Charges $$ ACUTE PT VISIT: 1 Visit                     Rexene, PT  Acute Rehab Dept [facility]) [phone]  10/20/2023    [facility] 10/20/2023, 3:19 PM

## 2023-10-20 NOTE — Discharge Summary (Signed)
 In most cases prophylactic antibiotics for Dental procdeures after total joint surgery are not necessary.  Exceptions are as follows:  1. History of prior total joint infection  2. Severely immunocompromised (Organ Transplant, cancer chemotherapy, Rheumatoid biologic meds such as Humera)  3. Poorly controlled diabetes (A1C &gt; 8.0, blood glucose over 200)  If you have one of these conditions, contact your surgeon for an antibiotic prescription, prior to your dental procedure. Orthopedic Discharge Summary        Physician Discharge Summary  Patient ID: Alicia Wilkerson MRN: [id number] DOB/AGE: 11-May-1950 73 y.o.  Admit date: 10/19/2023 Discharge date: 10/20/2023   Procedures:  Procedure(s) (LRB): OPEN REDUCTION INTERNAL FIXATION (ORIF) PATELLA (Right)  Attending Physician:  Dr. Elspeth Her  Admission Diagnoses:   Right knee displaced and comminuted patella fracture  Discharge Diagnoses:  same   Past Medical History:  Diagnosis Date   Anemia    Anxiety disorder    Basal cell carcinoma (BCC) of left upper arm 12/2015   Basal cell carcinoma (BCC) of right lower leg 04/2016   Borderline hypertension    CAD (coronary artery disease)    a. nonobst by cor CT 12/2018.   CIN II (cervical intraepithelial neoplasia II)    CIN  1   Depression    Eczema    Family history of malignant neoplasm of gastrointestinal tract 08/14/2020   Family history of premature CAD    Hard of hearing    wears hearing aids   History of basal cell carcinoma (BCC) excision    left upper arm 2017;   right lower leg 2018;  bilateral lower leg and hip area 2019;   inner right lower leg, outer right thigh, & upper outside left arm 09/ 2020   History of cervical dysplasia 2013   History of hereditary spherocytosis    s/p splenectomy in 1954   History of hyperparathyroidism    per pt yrs ago was put on mega dose of vit d which caused the hyperparathyroid resolved after stopping vit d    History of pelvic fracture 2013   Ischemic stroke (HCC) 08/07/2023   Left inguinal hernia 01/02/2023   MAI (mycobacterium avium-intracellulare) (HCC)    ? possibly by CT 12/2018   Mild hyperlipidemia    Osteoporosis    PONV (postoperative nausea and vomiting)    S/P hernia repair 03/02/2023   S/P splenectomy    spherocytosis   Wears glasses    White coat syndrome without diagnosis of hypertension     PCP: Geofm Glade PARAS, MD   Discharged Condition: good  Hospital Course:  Patient underwent the above stated procedure on 10/19/2023. Patient tolerated the procedure well and brought to the recovery room in good condition and subsequently to the floor. Patient had an uncomplicated hospital course and was stable for discharge.   Disposition: Discharge disposition: 01-Home or Self Care      with follow up in 2 weeks    Follow-up Information     Her Kemps, MD. Call in 2 week(s).   Specialty: Orthopedic Surgery Why: please call for appt in two weeks [phone] Contact information: [address] [place] [address] [phone]                 Dental Antibiotics:  In most cases prophylactic antibiotics for Dental procdeures after total joint surgery are not necessary.  Exceptions are as follows:  1. History of prior total joint infection  2. Severely immunocompromised (Organ  Transplant, cancer chemotherapy, Rheumatoid biologic meds such as Humera)  3. Poorly controlled diabetes (A1C &gt; 8.0, blood glucose over 200)  If you have one of these conditions, contact your surgeon for an antibiotic prescription, prior to your dental procedure.  Discharge Instructions     Call MD / Call 911   Complete by: As directed    If you experience chest pain or shortness of breath, CALL 911 and be transported to the hospital emergency room.  If you develope a fever above 101 F, pus (white drainage) or increased drainage or redness at the wound, or calf  pain, call your surgeon's office.   Constipation Prevention   Complete by: As directed    Drink plenty of fluids.  Prune juice may be helpful.  You may use a stool softener, such as Colace (over the counter) 100 mg twice a day.  Use MiraLax (over the counter) for constipation as needed.   Diet - low sodium heart healthy   Complete by: As directed    Increase activity slowly as tolerated   Complete by: As directed    Post-operative opioid taper instructions:   Complete by: As directed    POST-OPERATIVE OPIOID TAPER INSTRUCTIONS: It is important to wean off of your opioid medication as soon as possible. If you do not need pain medication after your surgery it is ok to stop day one. Opioids include: Codeine, Hydrocodone (Norco, Vicodin), Oxycodone (Percocet, oxycontin ) and hydromorphone  amongst others.  Long term and even short term use of opiods can cause: Increased pain response Dependence Constipation Depression Respiratory depression And more.  Withdrawal symptoms can include Flu like symptoms Nausea, vomiting And more Techniques to manage these symptoms Hydrate well Eat regular healthy meals Stay active Use relaxation techniques(deep breathing, meditating, yoga) Do Not substitute Alcohol to help with tapering If you have been on opioids for less than two weeks and do not have pain than it is ok to stop all together.  Plan to wean off of opioids This plan should start within one week post op of your joint replacement. Maintain the same interval or time between taking each dose and first decrease the dose.  Cut the total daily intake of opioids by one tablet each day Next start to increase the time between doses. The last dose that should be eliminated is the evening dose.          Allergies as of 10/20/2023       Reactions   Penicillins Rash   Did it involve swelling of the face/tongue/throat, SOB, or low BP? No Did it involve sudden or severe rash/hives, skin peeling,  or any reaction on the inside of your mouth or nose?  #  #  #  YES  #  #  #  Did you need to seek medical attention at a hospital or doctor's office? #  #  #  YES  #  #  #  When did it last happen?   years ago    If all above answers are NO, may proceed with cephalosporin use.   Sulfa Antibiotics Hives, Other (See Comments)   Nickel Hives   Penicillin G Other (See Comments)        Medication List     STOP taking these medications    oxycodone  5 MG capsule Commonly known as: OXY-IR       TAKE these medications    acetaminophen  325 MG tablet Commonly known as: TYLENOL  Take 2 tablets (650 mg total) by  mouth every 6 (six) hours as needed for mild pain (pain score 1-3) (or Fever >/= 101).   aspirin  EC 81 MG tablet Take 1 tablet (81 mg total) by mouth 2 (two) times daily after a meal. Swallow whole. What changed: when to take this   clonazePAM  0.5 MG tablet Commonly known as: KLONOPIN  Take 1 tablet (0.5 mg total) by mouth 2 (two) times daily as needed for anxiety.   clopidogrel  75 MG tablet Commonly known as: PLAVIX  Take 1 tablet (75 mg total) by mouth daily.   escitalopram  10 MG tablet Commonly known as: LEXAPRO  Take 1 tablet (10 mg total) by mouth at bedtime.   Fish Oil  600 MG Caps Take 2 capsules by mouth at bedtime.   HYDROcodone -acetaminophen  5-325 MG tablet Commonly known as: NORCO/VICODIN Take 1 tablet by mouth every 6 (six) hours.   losartan  25 MG tablet Commonly known as: COZAAR  Take 0.5 tablets (12.5 mg total) by mouth at bedtime.   magnesium  oxide 400 (240 Mg) MG tablet Commonly known as: MAG-OX Take 1 tablet (400 mg total) by mouth daily.   rosuvastatin  20 MG tablet Commonly known as: CRESTOR  Take 1 tablet (20 mg total) by mouth daily. Must keep scheduled appointment for future refills. Thank you.          Signed: [name] 10/20/2023, 7:51 AM  [facility] is now [facility]  [place] [address], [place], [place] [address] Phone: [phone] Facebook  Instagram  [employer]

## 2023-10-20 NOTE — Progress Notes (Addendum)
  Diagnosis code:  S82.001A  Height:  63  Weight: 122 lbs   Patient has lower extremity (recent surgery and recent CVA) which requires leg to be positioned in ways not feasible with a normal bed.  Patients lower body requires frequent and immediate changes in body position which cannot be achieved with a normal bed to prevent swelling and to allow for safe transfers in and out of bed.

## 2023-10-20 NOTE — Evaluation (Signed)
 Occupational Therapy Evaluation Patient Details Name: Alicia Wilkerson MRN: [id number] DOB: [birth year] Today's Date: 10/20/2023   History of Present Illness   Pt is a 73 y/o female admitted 10/19/23 after a fall at home(10/06/23). She is now s/p ORIF R Patella 10/19/23. PMH includes: Anxiety disorder, CAD, CIN II (cervical intraepithelial neoplasia II), Depression, HOH, History of basal cell carcinoma, History of cervical dysplasia (2013), History of hereditary spherocytosis, History of hyperparathyroidism, History of pelvic fracture (2013), Ischemic stroke (08/07/2023), Left inguinal hernia (01/02/2023), MAI (mycobacterium avium-intracellulare) (HCC), Mild hyperlipidemia, Osteoporosis, S/P hernia repair (03/02/2023), S/P splenectomy.     Clinical Impressions Pt typically lives alone with her cat Chewy. She reports frequent falls and recent CVA with inpatient rehab post-acute followed by neuro outpatient therapies. She reports using a SPC PRN and managing her own finances/meds. Daughter delivers food to her door via food delivery service. She reports that she has been driving again since her CVA after being cleared by outpatient therapy. Today Pt demonstrates good strength and decent balance with stationary tasks (dynamic untested). She demonstrated bed mobility, toilet transfer, peri care, LB dressing, UB dressing, standing grooming, picking an object up off the floor. Please see ADL section for full education and task fulfillment information. Biggest concern is decreased safety awareness and high fall risk/rehospitalization (please see cognitive section). OT also reviewed/problem solved verbally caring for cat, getting [employer] packages delivered, bathing initially. At this time HIGHLY recommending therapy follow up for home safety and falls risk assessment IN HER HOME ENVIRONMENT to prevent falls and re-admission.      If plan is discharge home, recommend the following:   A little help with  bathing/dressing/bathroom;Assistance with cooking/housework;Assist for transportation     Functional Status Assessment   Patient has had a recent decline in their functional status and demonstrates the ability to make significant improvements in function in a reasonable and predictable amount of time.     Equipment Recommendations   None recommended by OT (Pt has appropriate DME)     Recommendations for Other Services   PT consult     Precautions/Restrictions   Precautions Precautions: Fall Recall of Precautions/Restrictions: Intact Required Braces or Orthoses: Knee Immobilizer - Right Knee Immobilizer - Right: On at all times Restrictions Weight Bearing Restrictions Per Provider Order: Yes RLE Weight Bearing Per Provider Order: Weight bearing as tolerated     Mobility Bed Mobility Overal bed mobility: Needs Assistance Bed Mobility: Supine to Sit     Supine to sit: Min assist, HOB elevated     General bed mobility comments: min A for RLE to lower to floor for comfort while bringing hips EOB    Transfers Overall transfer level: Needs assistance Equipment used: Rolling walker (2 wheels) Transfers: Sit to/from Stand, Bed to chair/wheelchair/BSC Sit to Stand: Contact guard assist     Step pivot transfers: Supervision     General transfer comment: Pt with good hand placement pushing off seated surface, good balance noted with transfers      Balance Overall balance assessment: Mild deficits observed, not formally tested, History of Falls (during therapy Pt is thorough and slow - she has a pattern and history of falls when she puts herself in high risk situations)                                         ADL either performed or assessed with clinical judgement  ADL Overall ADL's : Needs assistance/impaired Eating/Feeding: Independent   Grooming: Wash/dry hands;Supervision/safety;Standing Grooming Details (indicate cue type and reason):  no UE support at sink, good standing balance Upper Body Bathing: Modified independent;Sitting   Lower Body Bathing: Supervison/ safety;Sitting/lateral leans Lower Body Bathing Details (indicate cue type and reason): educated on long handle sponge, but can reach down to toes with knee straight Upper Body Dressing : Modified independent   Lower Body Dressing: Supervision/safety;Sitting/lateral leans;Sit to/from stand Lower Body Dressing Details (indicate cue type and reason): educated on proper sequence, Pt able to demonstrate getting to feet with knee immobilizer on, demonstrated donning knee immobilizer Toilet Transfer: Contact guard assist;Ambulation;Rolling walker (2 wheels) Toilet Transfer Details (indicate cue type and reason): Pt squatted over toilet with R knee straight and use of grab bars. Educated Pt to use toilet riser/3 in 1 for safety and to use BUE for safety up and down from seated position. Pt educated that SQUATTING FOR TOILETING IS NOT SAFE and she should sit Toileting- Clothing Manipulation and Hygiene: Set up;Sitting/lateral lean Toileting - Clothing Manipulation Details (indicate cue type and reason): warn wash cloth provided for peri care. Educated on set up of wet wipes and trash can by the toilet not only for cleanliness, but also to prop RLE up on for comfort     Functional mobility during ADLs: Supervision/safety;Rolling walker (2 wheels) General ADL Comments: Pt impulsive and has decreased problem solving/reasoning with decreased safety awareness. She is resourceful and desires independence more than anything. She has good flexibility and strength. My biggest concern is that she puts herself in high fall risk situations and is at high risk for falls.     Vision Ability to See in Adequate Light: 1 Impaired Patient Visual Report: Peripheral vision impairment (per patient from previous CVA (now baseline - not new))       Perception         Praxis          Pertinent Vitals/Pain Pain Assessment Pain Assessment: No/denies pain     Extremity/Trunk Assessment Upper Extremity Assessment Upper Extremity Assessment: Overall WFL for tasks assessed   Lower Extremity Assessment Lower Extremity Assessment: RLE deficits/detail;Defer to PT evaluation RLE Deficits / Details: in knee immobilizer post-op RLE: Unable to fully assess due to immobilization RLE Coordination: decreased gross motor   Cervical / Trunk Assessment Cervical / Trunk Assessment: Normal   Communication Communication Communication: No apparent difficulties Factors Affecting Communication: Hearing impaired (wears hearing aides)   Cognition Arousal: Alert Behavior During Therapy: Impulsive Cognition: Cognition impaired     Awareness: Online awareness impaired, Intellectual awareness impaired 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 There is a disconnect between choices and actions/consequences to those choices. Decreased safety awareness is #1 concern and failure to recognize that she puts herself in higher risk situations that have resulted in multiple falls and re-hospitalization.                 Following commands: Intact       Cueing  General Comments   Cueing Techniques: Verbal cues;Tactile cues  son present throughout and supportive. But he lives in [place].   Exercises     Shoulder Instructions      Home Living Family/patient expects to be discharged to:: Private residence Living Arrangements: Alone (1 cat: Chewy) Available Help at Discharge: Family;Available PRN/intermittently (children are out of  town) Type of Home: House Home Access: Stairs to enter [employer] of Steps: 4 not as steep in front, 3 steep on side Entrance Stairs-Rails: Right;Left Home Layout: Two  level;Bed/bath upstairs;1/2 bath on main level Alternate Level Stairs-Number of Steps: flight Alternate Level Stairs-Rails: Left Bathroom Shower/Tub: Tub/shower unit;Door   [employer] Toilet: Standard Bathroom Accessibility: Yes How Accessible: Accessible via walker Home Equipment: Agricultural consultant (2 wheels);Cane - single point;BSC/3in1;Wheelchair - manual;Tub bench          Prior Functioning/Environment Prior Level of Function : Independent/Modified Independent;Driving (reports no DME and driving since recent discharge from therapies post CVA.)             Mobility Comments: was using no DME or SPC ADLs Comments: daughter delivers food via [employer] (dtger in [place]) Pt manages own meds and finances and was driving again    OT Problem List: Decreased range of motion;Decreased activity tolerance;Impaired balance (sitting and/or standing);Impaired vision/perception;Decreased safety awareness;Decreased knowledge of use of DME or AE;Decreased knowledge of precautions   OT Treatment/Interventions:        OT Goals(Current goals can be found in the care plan section)   Acute Rehab OT Goals Patient Stated Goal: be independent again OT Goal Formulation: With patient/family Time For Goal Achievement: 11/03/23 Potential to Achieve Goals: Good   OT Frequency:       Co-evaluation              AM-PAC OT 6 Clicks Daily Activity     Outcome Measure Help from another person eating meals?: None Help from another person taking care of personal grooming?: A Little Help from another person toileting, which includes using toliet, bedpan, or urinal?: A Little Help from another person bathing (including washing, rinsing, drying)?: A Little Help from another person to put on and taking off regular upper body clothing?: None Help from another person to put on and taking off regular lower body clothing?: A Little 6 Click Score: 20   End of Session Equipment Utilized During Treatment: Gait  belt;Rolling walker (2 wheels);Right knee immobilizer Nurse Communication: Mobility status;Precautions  Activity Tolerance: Patient tolerated treatment well Patient left: in chair;with call bell/phone within reach;with chair alarm set;with family/visitor present  OT Visit Diagnosis: History of falling (Z91.81);Other symptoms and signs involving cognitive function                Time: 9098-9041 OT Time Calculation (min): 57 min Charges:  OT General Charges $OT Visit: 1 Visit OT Evaluation $OT Eval Low Complexity: 1 Low OT Treatments $Self Care/Home Management : 8-22 mins  Leita DEL OTR/L Acute Rehabilitation Services Office: [phone]  Leita PARAS [facility] 10/20/2023, 10:47 AM

## 2023-10-27 ENCOUNTER — Telehealth: Payer: Self-pay

## 2023-10-27 NOTE — Telephone Encounter (Signed)
 Copied from CRM #[id number]. Topic: General - Other >> Oct 27, 2023  3:12 PM Chiquita SQUIBB wrote: Reason for CRM: Patient is calling in requesting home health aid, transportation and she needs her outpatient therapy changed to inpatient therapy so the insurance will approve it as they said she would have to be house bound to get home health help, that she needs due to her recent surgery. Please advise the patient.

## 2023-10-28 ENCOUNTER — Telehealth: Payer: Self-pay | Admitting: Radiology

## 2023-10-28 NOTE — Telephone Encounter (Signed)
 Ortho should do this

## 2023-10-28 NOTE — Telephone Encounter (Signed)
 Copied from CRM #[id number]. Topic: General - Other >> Oct 27, 2023  3:12 PM Alicia Wilkerson wrote: Reason for CRM: Patient is calling in requesting home health aid, transportation and she needs her outpatient therapy changed to inpatient therapy so the insurance will approve it as they said she would have to be house bound to get home health help, that she needs due to her recent surgery. Please advise the patient. >> Oct 28, 2023  1:07 PM [name] wrote: Patients daughter calling back stating it might be some miscommunication, [name] states she's trying to setup a home health aide for her mom for daily needs since she can't be by herself since the stroke. States she spoke with [place] and patient qualifies for up to 35 hours per week for a home health aide they just need a referral. [name] states patient doesn't need home health aide due to her broken leg she needs assistance during the day since they have to work and can't be with her 24/7, please reach out to [place] for more clarification. Also has provided case managers information at [facility] [employer] [phone]  [name] [phone]

## 2023-10-28 NOTE — Telephone Encounter (Signed)
 Spoke with patient today.

## 2023-10-29 ENCOUNTER — Ambulatory Visit: Admitting: Neurology

## 2023-10-29 ENCOUNTER — Encounter: Payer: Self-pay | Admitting: Neurology

## 2023-10-29 ENCOUNTER — Telehealth: Payer: Self-pay | Admitting: Neurology

## 2023-10-29 VITALS — BP 137/75 | HR 76 | Wt 123.2 lb

## 2023-10-29 DIAGNOSIS — I639 Cerebral infarction, unspecified: Secondary | ICD-10-CM

## 2023-10-29 DIAGNOSIS — R4189 Other symptoms and signs involving cognitive functions and awareness: Secondary | ICD-10-CM

## 2023-10-29 DIAGNOSIS — Z9181 History of falling: Secondary | ICD-10-CM | POA: Diagnosis not present

## 2023-10-29 MED ORDER — CLOPIDOGREL BISULFATE 75 MG PO TABS: 75.0000 mg | ORAL_TABLET | Freq: Every day | ORAL | 3 refills | Status: AC

## 2023-10-29 NOTE — Progress Notes (Unsigned)
 Chief Complaint  Patient presents with   Hospitalization Follow-up   ASSESSMENT AND PLAN  Alicia Wilkerson is a 73 y.o. female   Multiple acute ischemic infarction involving right MCA/PCA territory in June 2025 With occlusion of right internal carotid artery   Etiology of her stroke considered due to failure of collateral and chronic occlusion versus embolic, 30-day cardiac monitoring showed no evidence of atrial fibrillation  Dual antiplatelet aspirin  plus Plavix  for 3 months, then Plavix  75 mg alone   Gait abnormality  Hyperreflexia on exam,  frequent fall, CT cervical showed multilevel degenerative changes  MRI of cervical spine to rule out spondylitic myelopathy  Home health refer   DIAGNOSTIC DATA (LABS, IMAGING, TESTING) - I reviewed patient records, labs, notes, testing and imaging myself where available.   MEDICAL HISTORY:  Alicia Wilkerson is a 73 year old female, seen in request by her primary care doctor Geofm Hastings follow-up on stroke, I also connected with her daughter Wyvonna via the phone call.  History is obtained from the patient and review of electronic medical records. I personally reviewed pertinent available imaging films in PACS.   PMHx of  Anxiety HLD HTN CAD Hereditary spherotycosis  On August 09, 2023, she was FaceTime with her daughter Marcelline, who noticed that mother was not herself, was confused, prior to that, she had suffered few falls  MRI of the brain August 10, 2023 showed patchy small volume of acute ischemic nonhemorrhagic infarction involving right thalamus, right occipital lobe, right mesial temporal lobe,/right hippocampus, mainly at the right PCA distribution, few additional punctuated acute ischemic infarction at the right frontal lobe, also evidence of advanced atrophy,  CT angiogram head and neck showed occlusion of right internal carotid artery, fetal type right PCA with severe stenosis versus occlusion at the origin of the right P-comm, right  PCA attenuated but patent distally  She was seen by Dr. Rosemarie at hospital, etiology of stroke considered to be failure of collateralizing chronic occlusion versus embolic,  Echocardiogram normal ejection fraction 60 to 65%  US  carotid: Total occlusion of right internal carotid artery, left internal carotid artery near normal, vertebral artery antegrade flow bilaterally  Daughter reported that she has slow onset gait abnormality, frequent falls before the stroke, since stroke, she was discharged with aspirin  81+ Plavix  75 mg, the plan is for dual antiplatelet agent for 3 months, then Plavix  alone, she has a history of coronary artery disease, was on aspirin  81 prior to stroke  She fell, suffered displaced right patella fracture, required open reduction and internal fixation on October 19, 2023, now wearing a rigid right leg brace, ambulate with a cane  Daughter is also concerned about much worsening memory since her stroke, she was windowed since young age, raised 3 children all by herself, very independent, retired Firefighter, 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  PHYSICAL EXAMNIATION:  Gen: NAD, conversant, well nourised, well groomed                     Cardiovascular: Regular rate rhythm, no peripheral edema, warm, nontender. Eyes: Conjunctivae clear without exudates or  hemorrhage Neck: Supple, no carotid bruits. Pulmonary: Clear to auscultation bilaterally   NEUROLOGICAL EXAM:  MENTAL STATUS: Speech/cognition: Awake, alert, oriented to history taking and casual conversation    10/29/2023   10:37 AM  Montreal Cognitive Assessment   Visuospatial/ Executive (0/5) 3  Naming  (0/3) 3  Attention: Read list of digits (0/2) 2  Attention: Read list of letters (0/1) 1  Attention: Serial 7 subtraction starting at 100 (0/3) 3  Language: Repeat phrase (0/2) 2  Language : Fluency (0/1) 1  Abstraction (0/2) 2  Delayed Recall (0/5) 4  Orientation (0/6) 6  Total 27    CRANIAL NERVES: CN II: Visual fields are full to confrontation. Pupils are round equal and briskly reactive to light. CN III, IV, VI: extraocular movement are normal. No ptosis. CN V: Facial sensation is intact to light touch CN VII: Face is symmetric with normal eye closure  CN VIII: Hearing is normal to causal conversation. CN IX, X: Phonation is normal. CN XI: Head turning and shoulder shrug are intact  MOTOR: There is no pronator drift of out-stretched arms. Muscle bulk and tone are normal. Muscle strength is normal.  REFLEXES: Reflexes are 2+ and symmetric at the biceps, triceps, knees, and ankles. Plantar responses are extensor bilaterally  SENSORY: Intact to light touch, pinprick and vibratory sensation are intact in fingers and toes.  COORDINATION: There is no trunk or limb dysmetria noted.  GAIT/STANCE: push up,  right leg in brace, rely on her cane  REVIEW OF SYSTEMS:  Full 14 system review of systems performed and notable only for as above All other review of systems were negative.   ALLERGIES: Allergies  Allergen Reactions   Penicillins Rash    Did it involve swelling of the face/tongue/throat, SOB, or low BP? No Did it involve sudden or severe rash/hives, skin peeling, or any reaction on the inside of your mouth or nose?  #  #  #  YES  #  #  #  Did you need to seek medical attention at a hospital or doctor's office? #  #  #  YES  #  #  #  When did it last happen?   years ago    If all above answers are NO, may proceed with cephalosporin use.    Sulfa Antibiotics Hives and Other (See Comments)   Nickel Hives   Penicillin G Other (See Comments)    HOME  MEDICATIONS: Current Outpatient Medications  Medication Sig Dispense Refill   acetaminophen  (TYLENOL ) 325 MG tablet Take 2 tablets (650 mg total) by mouth every 6 (six) hours as needed for mild pain (pain score 1-3) (or Fever >/= 101).     aspirin  EC 81 MG tablet Take 1 tablet (81 mg total) by mouth 2 (two) times daily after a meal. Swallow whole. 60 tablet 0   clonazePAM  (KLONOPIN ) 0.5 MG tablet Take 1 tablet (0.5 mg total) by mouth 2 (two) times daily as needed for anxiety. 30 tablet 4   clopidogrel  (PLAVIX ) 75 MG tablet Take 1 tablet (75 mg total) by mouth daily. 30 tablet 5   escitalopram  (LEXAPRO ) 10 MG tablet Take 1 tablet (10 mg total) by mouth at bedtime. 30 tablet 5   HYDROcodone -acetaminophen  (NORCO/VICODIN) 5-325 MG tablet Take 1 tablet by mouth every 6 (six) hours.     magnesium  oxide (MAG-OX) 400 (240 Mg) MG tablet TAKE 1 TABLET BY MOUTH EVERY DAY 30 tablet 5   Omega-3 Fatty Acids  (FISH OIL ) 600 MG  CAPS Take 2 capsules by mouth at bedtime. 30 capsule 0   rosuvastatin  (CRESTOR ) 20 MG tablet Take 1 tablet (20 mg total) by mouth daily. Must keep scheduled appointment for future refills. Thank you. 30 tablet 0   losartan  (COZAAR ) 25 MG tablet Take 0.5 tablets (12.5 mg total) by mouth at bedtime. (Patient not taking: Reported on 10/29/2023) 30 tablet 0   No current facility-administered medications for this visit.    PAST MEDICAL HISTORY: Past Medical History:  Diagnosis Date   Anemia    Anxiety disorder    Basal cell carcinoma (BCC) of left upper arm 12/2015   Basal cell carcinoma (BCC) of right lower leg 04/2016   Borderline hypertension    CAD (coronary artery disease)    a. nonobst by cor CT 12/2018.   CIN II (cervical intraepithelial neoplasia II)    CIN  1   Depression    Eczema    Family history of malignant neoplasm of gastrointestinal tract 08/14/2020   Family history of premature CAD    Hard of hearing    wears hearing aids   History of basal cell carcinoma (BCC)  excision    left upper arm 2017;   right lower leg 2018;  bilateral lower leg and hip area 2019;   inner right lower leg, outer right thigh, & upper outside left arm 09/ 2020   History of cervical dysplasia 2013   History of hereditary spherocytosis    s/p splenectomy in 1954   History of hyperparathyroidism    per pt yrs ago was put on mega dose of vit d which caused the hyperparathyroid resolved after stopping vit d   History of pelvic fracture 2013   Ischemic stroke (HCC) 08/07/2023   Left inguinal hernia 01/02/2023   MAI (mycobacterium avium-intracellulare) (HCC)    ? possibly by CT 12/2018   Mild hyperlipidemia    Osteoporosis    PONV (postoperative nausea and vomiting)    S/P hernia repair 03/02/2023   S/P splenectomy    spherocytosis   Wears glasses    White coat syndrome without diagnosis of hypertension     PAST SURGICAL HISTORY: Past Surgical History:  Procedure Laterality Date   ANKLE SURGERY  03/14/2015   BREAST BIOPSY Left 08/30/2021   CYSTIC APOCRINE METAPLASIA   CERVICAL CONIZATION W/BX N/A 12/13/2018   Procedure: CONIZATION CERVIX WITH BIOPSY;  Surgeon: Cleotilde Ronal RAMAN, MD;  Location: [facility] OR;  Service: Gynecology;  Laterality: N/A;   CYSTOSCOPY N/A 10/18/2019   Procedure: CYSTOSCOPY;  Surgeon: Cleotilde Ronal RAMAN, MD;  Location: [facility];  Service: Gynecology;  Laterality: N/A;   IR RADIOLOGY PERIPHERAL GUIDED IV START  01/05/2019   IR US  GUIDE VASC ACCESS RIGHT  01/05/2019   KNEE SURGERY Left 1991   per pt retained hardward   LEEP N/A 12/13/2018   Procedure: possible LOOP ELECTROSURGICAL EXCISION PROCEDURE (LEEP);  Surgeon: Cleotilde Ronal RAMAN, MD;  Location: [facility] OR;  Service: Gynecology;  Laterality: N/A;   ORIF ANKLE FRACTURE Left 03/14/2015   per pt retained hardware   ORIF PATELLA Right 10/19/2023   Procedure: OPEN REDUCTION INTERNAL FIXATION (ORIF) PATELLA;  Surgeon: Kay Kemps, MD;  Location: [facility] ORS;  Service: Orthopedics;  Laterality: Right;    ORIF WRIST FRACTURE Left 03/10/2022   Procedure: OPEN REDUCTION INTERNAL FIXATION (ORIF) WRIST FRACTURE;  Surgeon: Shari Easter, MD;  Location: [facility] OR;  Service: Orthopedics;  Laterality: Left;  regional with iv sedation   SPLENECTOMY, TOTAL  1954  due to spherocytosis   TOTAL LAPAROSCOPIC HYSTERECTOMY WITH SALPINGECTOMY N/A 10/18/2019   Procedure: TOTAL LAPAROSCOPIC HYSTERECTOMY WITH BILATERAL SALPINGECTOMY AND BILATERAL OOPHORECTOMY;  Surgeon: Cleotilde Ronal RAMAN, MD;  Location: [facility];  Service: Gynecology;  Laterality: N/A;  BILATERAL SALPINGECTOMY POSS OOPHORECTOMY   TUBAL LIGATION Bilateral 1995    FAMILY HISTORY: Family History  Problem Relation Age of Onset   Osteoporosis Mother    Colon cancer Mother        deceased   Heart failure Father    Skin cancer Father    Hypertension Father    Heart disease Father    Heart disease Sister    Crohn's disease Daughter    Breast cancer Neg Hx     SOCIAL HISTORY: Social History   Socioeconomic History   Marital status: Widowed    Spouse name: Not on file   Number of children: 3   Years of education: Not on file   Highest education level: Not on file  Occupational History   Occupation: RETIRED  Tobacco Use   Smoking status: Never   Smokeless tobacco: Never  Vaping Use   Vaping status: Never Used  Substance and Sexual Activity   Alcohol use: Yes    Comment: social   Drug use: No   Sexual activity: Not Currently    Partners: Male    Birth control/protection: Surgical, Post-menopausal    Comment: BTL & hysterectomy  Other Topics Concern   Not on file  Social History Narrative   Lives alone. 2025   Social Drivers of Corporate investment banker Strain: Low Risk  (05/01/2023)   Overall Financial Resource Strain (CARDIA)    Difficulty of Paying Living Expenses: Not very hard  Food Insecurity: No Food Insecurity (10/19/2023)   Hunger Vital Sign    Worried About Running Out of Food in the Last Year: Never  true    Ran Out of Food in the Last Year: Never true  Transportation Needs: No Transportation Needs (10/19/2023)   PRAPARE - Administrator, Civil Service (Medical): No    Lack of Transportation (Non-Medical): No  Physical Activity: Insufficiently Active (05/01/2023)   Exercise Vital Sign    Days of Exercise per Week: 4 days    Minutes of Exercise per Session: 30 min  Stress: Stress Concern Present (05/01/2023)   [employer] of Occupational Health - Occupational Stress Questionnaire    Feeling of Stress : To some extent  Social Connections: Socially Isolated (10/19/2023)   Social Connection and Isolation Panel    Frequency of Communication with Friends and Family: More than three times a week    Frequency of Social Gatherings with Friends and Family: More than three times a week    Attends Religious Services: Never    Database administrator or Organizations: No    Attends Banker Meetings: Never    Marital Status: Widowed  Intimate Partner Violence: Not At Risk (10/19/2023)   Humiliation, Afraid, Rape, and Kick questionnaire    Fear of Current or Ex-Partner: No    Emotionally Abused: No    Physically Abused: No    Sexually Abused: No      Modena Callander, M.D. Ph.D.  [facility] [address], [address] [place], [place] [address] Ph: [phone] Fax: [phone]  CC:  Laurence Locus, DO [address] [place],  [place] [address]  Geofm Glade PARAS, MD

## 2023-10-29 NOTE — Telephone Encounter (Signed)
 sent to [facility] they obtain [facility] shara [phone]

## 2023-10-29 NOTE — Telephone Encounter (Signed)
 [facility] is going to take this patient.

## 2023-11-03 ENCOUNTER — Encounter: Payer: Self-pay | Admitting: Neurology

## 2023-11-03 DIAGNOSIS — F32A Depression, unspecified: Secondary | ICD-10-CM | POA: Diagnosis not present

## 2023-11-03 DIAGNOSIS — Z7902 Long term (current) use of antithrombotics/antiplatelets: Secondary | ICD-10-CM | POA: Diagnosis not present

## 2023-11-03 DIAGNOSIS — E785 Hyperlipidemia, unspecified: Secondary | ICD-10-CM | POA: Diagnosis not present

## 2023-11-03 DIAGNOSIS — M810 Age-related osteoporosis without current pathological fracture: Secondary | ICD-10-CM | POA: Diagnosis not present

## 2023-11-03 DIAGNOSIS — Z556 Problems related to health literacy: Secondary | ICD-10-CM | POA: Diagnosis not present

## 2023-11-03 DIAGNOSIS — D649 Anemia, unspecified: Secondary | ICD-10-CM | POA: Diagnosis not present

## 2023-11-03 DIAGNOSIS — E210 Primary hyperparathyroidism: Secondary | ICD-10-CM | POA: Diagnosis not present

## 2023-11-03 DIAGNOSIS — F419 Anxiety disorder, unspecified: Secondary | ICD-10-CM | POA: Diagnosis not present

## 2023-11-03 DIAGNOSIS — Z4889 Encounter for other specified surgical aftercare: Secondary | ICD-10-CM | POA: Diagnosis not present

## 2023-11-03 DIAGNOSIS — R269 Unspecified abnormalities of gait and mobility: Secondary | ICD-10-CM | POA: Diagnosis not present

## 2023-11-03 DIAGNOSIS — I10 Essential (primary) hypertension: Secondary | ICD-10-CM | POA: Diagnosis not present

## 2023-11-03 DIAGNOSIS — S82001D Unspecified fracture of right patella, subsequent encounter for closed fracture with routine healing: Secondary | ICD-10-CM | POA: Diagnosis not present

## 2023-11-03 DIAGNOSIS — Z5982 Transportation insecurity: Secondary | ICD-10-CM | POA: Diagnosis not present

## 2023-11-03 DIAGNOSIS — I69398 Other sequelae of cerebral infarction: Secondary | ICD-10-CM | POA: Diagnosis not present

## 2023-11-03 DIAGNOSIS — Z85828 Personal history of other malignant neoplasm of skin: Secondary | ICD-10-CM | POA: Diagnosis not present

## 2023-11-03 DIAGNOSIS — I2510 Atherosclerotic heart disease of native coronary artery without angina pectoris: Secondary | ICD-10-CM | POA: Diagnosis not present

## 2023-11-03 DIAGNOSIS — H9190 Unspecified hearing loss, unspecified ear: Secondary | ICD-10-CM | POA: Diagnosis not present

## 2023-11-03 DIAGNOSIS — Z9181 History of falling: Secondary | ICD-10-CM | POA: Diagnosis not present

## 2023-11-03 DIAGNOSIS — Z7982 Long term (current) use of aspirin: Secondary | ICD-10-CM | POA: Diagnosis not present

## 2023-11-03 NOTE — Telephone Encounter (Signed)
 tanner w/home health called [facility] for verbal orders and home health aide for shower assistance. I approved under Dr. Onita

## 2023-11-04 ENCOUNTER — Other Ambulatory Visit: Payer: Self-pay | Admitting: Nurse Practitioner

## 2023-11-05 ENCOUNTER — Encounter: Payer: Self-pay | Admitting: Internal Medicine

## 2023-11-05 NOTE — Progress Notes (Deleted)
    Subjective:    Patient ID: Alicia Wilkerson, female    DOB: [birth year], 73 y.o.   MRN: [id number]      HPI Aimar is here for No chief complaint on file.   8/14 ED - [facility] 2 days prio going up driveway.  She hit her right knee - xray showed a Comminuted displaced mid patellar fracture.  ORIF patella on 10/19/23     Medications and allergies reviewed with patient and updated if appropriate.  Current Outpatient Medications on File Prior to Visit  Medication Sig Dispense Refill   acetaminophen  (TYLENOL ) 325 MG tablet Take 2 tablets (650 mg total) by mouth every 6 (six) hours as needed for mild pain (pain score 1-3) (or Fever >/= 101).     aspirin  EC 81 MG tablet Take 1 tablet (81 mg total) by mouth 2 (two) times daily after a meal. Swallow whole. 60 tablet 0   clonazePAM  (KLONOPIN ) 0.5 MG tablet Take 1 tablet (0.5 mg total) by mouth 2 (two) times daily as needed for anxiety. 30 tablet 4   clopidogrel  (PLAVIX ) 75 MG tablet Take 1 tablet (75 mg total) by mouth daily. 90 tablet 3   escitalopram  (LEXAPRO ) 10 MG tablet Take 1 tablet (10 mg total) by mouth at bedtime. 30 tablet 5   HYDROcodone -acetaminophen  (NORCO/VICODIN) 5-325 MG tablet Take 1 tablet by mouth every 6 (six) hours.     losartan  (COZAAR ) 25 MG tablet Take 0.5 tablets (12.5 mg total) by mouth at bedtime. (Patient not taking: Reported on 10/29/2023) 30 tablet 0   magnesium  oxide (MAG-OX) 400 (240 Mg) MG tablet TAKE 1 TABLET BY MOUTH EVERY DAY 30 tablet 5   Omega-3 Fatty Acids  (FISH OIL ) 600 MG CAPS Take 2 capsules by mouth at bedtime. 30 capsule 0   rosuvastatin  (CRESTOR ) 20 MG tablet TAKE 1 TABLET (20 MG TOTAL) BY MOUTH DAILY. MUST KEEP SCHEDULED APPOINTMENT FOR FUTURE REFILLS. THANK YOU. 30 tablet 0   No current facility-administered medications on file prior to visit.    Review of Systems     Objective:  There were no vitals filed for this visit. BP Readings from Last 3 Encounters:  10/29/23 137/75  10/20/23 138/66   10/09/23 119/76   Wt Readings from Last 3 Encounters:  10/29/23 123 lb 3.2 oz (55.9 kg)  10/20/23 122 lb (55.3 kg)  10/09/23 120 lb (54.4 kg)   There is no height or weight on file to calculate BMI.    Physical Exam         Assessment & Plan:    See Problem List for Assessment and Plan of chronic medical problems.

## 2023-11-06 ENCOUNTER — Ambulatory Visit: Admitting: Internal Medicine

## 2023-11-06 DIAGNOSIS — Z7902 Long term (current) use of antithrombotics/antiplatelets: Secondary | ICD-10-CM | POA: Diagnosis not present

## 2023-11-06 DIAGNOSIS — Z7982 Long term (current) use of aspirin: Secondary | ICD-10-CM | POA: Diagnosis not present

## 2023-11-06 DIAGNOSIS — H9190 Unspecified hearing loss, unspecified ear: Secondary | ICD-10-CM | POA: Diagnosis not present

## 2023-11-06 DIAGNOSIS — E210 Primary hyperparathyroidism: Secondary | ICD-10-CM | POA: Diagnosis not present

## 2023-11-06 DIAGNOSIS — I2510 Atherosclerotic heart disease of native coronary artery without angina pectoris: Secondary | ICD-10-CM | POA: Diagnosis not present

## 2023-11-06 DIAGNOSIS — S82001D Unspecified fracture of right patella, subsequent encounter for closed fracture with routine healing: Secondary | ICD-10-CM | POA: Diagnosis not present

## 2023-11-06 DIAGNOSIS — I69398 Other sequelae of cerebral infarction: Secondary | ICD-10-CM | POA: Diagnosis not present

## 2023-11-06 DIAGNOSIS — E785 Hyperlipidemia, unspecified: Secondary | ICD-10-CM | POA: Diagnosis not present

## 2023-11-06 DIAGNOSIS — I10 Essential (primary) hypertension: Secondary | ICD-10-CM | POA: Diagnosis not present

## 2023-11-06 DIAGNOSIS — Z9181 History of falling: Secondary | ICD-10-CM | POA: Diagnosis not present

## 2023-11-06 DIAGNOSIS — Z85828 Personal history of other malignant neoplasm of skin: Secondary | ICD-10-CM | POA: Diagnosis not present

## 2023-11-06 DIAGNOSIS — F32A Depression, unspecified: Secondary | ICD-10-CM | POA: Diagnosis not present

## 2023-11-06 DIAGNOSIS — F419 Anxiety disorder, unspecified: Secondary | ICD-10-CM | POA: Diagnosis not present

## 2023-11-06 DIAGNOSIS — Z556 Problems related to health literacy: Secondary | ICD-10-CM | POA: Diagnosis not present

## 2023-11-06 DIAGNOSIS — D649 Anemia, unspecified: Secondary | ICD-10-CM | POA: Diagnosis not present

## 2023-11-06 DIAGNOSIS — R269 Unspecified abnormalities of gait and mobility: Secondary | ICD-10-CM | POA: Diagnosis not present

## 2023-11-06 DIAGNOSIS — Z5982 Transportation insecurity: Secondary | ICD-10-CM | POA: Diagnosis not present

## 2023-11-06 DIAGNOSIS — M810 Age-related osteoporosis without current pathological fracture: Secondary | ICD-10-CM | POA: Diagnosis not present

## 2023-11-10 ENCOUNTER — Ambulatory Visit (HOSPITAL_BASED_OUTPATIENT_CLINIC_OR_DEPARTMENT_OTHER): Payer: Self-pay | Admitting: Cardiology

## 2023-11-10 ENCOUNTER — Encounter (HOSPITAL_BASED_OUTPATIENT_CLINIC_OR_DEPARTMENT_OTHER): Payer: Self-pay | Admitting: Cardiology

## 2023-11-10 ENCOUNTER — Ambulatory Visit (HOSPITAL_BASED_OUTPATIENT_CLINIC_OR_DEPARTMENT_OTHER): Admitting: Cardiology

## 2023-11-10 VITALS — BP 130/70 | HR 82 | Resp 17 | Ht 62.0 in | Wt 122.0 lb

## 2023-11-10 DIAGNOSIS — I2510 Atherosclerotic heart disease of native coronary artery without angina pectoris: Secondary | ICD-10-CM | POA: Diagnosis not present

## 2023-11-10 DIAGNOSIS — Z712 Person consulting for explanation of examination or test findings: Secondary | ICD-10-CM

## 2023-11-10 DIAGNOSIS — I6521 Occlusion and stenosis of right carotid artery: Secondary | ICD-10-CM

## 2023-11-10 DIAGNOSIS — I639 Cerebral infarction, unspecified: Secondary | ICD-10-CM | POA: Diagnosis not present

## 2023-11-10 DIAGNOSIS — Z8673 Personal history of transient ischemic attack (TIA), and cerebral infarction without residual deficits: Secondary | ICD-10-CM

## 2023-11-10 DIAGNOSIS — E7800 Pure hypercholesterolemia, unspecified: Secondary | ICD-10-CM | POA: Diagnosis not present

## 2023-11-10 DIAGNOSIS — I517 Cardiomegaly: Secondary | ICD-10-CM

## 2023-11-10 NOTE — Progress Notes (Signed)
 Cardiology Office Note:  .   Date:  11/10/2023  ID:  Alicia Wilkerson, DOB Sep 13, 1950, MRN [id number] PCP: Geofm Glade PARAS, MD  [facility] Providers Cardiologist:  Shelda Bruckner, MD {  History of Present Illness: .   Alicia Wilkerson is a 73 y.o. female with PMH CVA, hypertension, hyperlipidemia, coronary disease, hereditary spherocytosis. She was previously followed by Dr. Maranda and Dr. Hobart and established care with me on 11/10/23.  Pertinent CV history: TTE 10/2018 with LVEF 60-65%, no LVH, elevated LVEDP, diastolic dysfunction, normal PASP, mild TR. Coronary CTA 12/2018 with calcium  score 544 (96th percentile for age/sex) with diffuse moderate to severe calcified plaque in the prox to mid LAD with possibly severe stenosis in mid LAD. FFR of LAD were prox 0.94, mid 0.92, distal 0.82, arguing against hemodynamically significant stenosis. Chest overread indicated appearance of the lungs suggested chronic indolent atypical infectious process such as myobacterium avium intracellulare (MAI) and aortic atherosclerosis. Pulmonology referral recommended but patient declined. Crestor  and Aspirin  were initiated.    In 10/2021 had myoview  for DOE, normal with likely breast artifact but no ischemia and robust EF.   Today: Admitted in 07/2023 for acute ischemic CVA in R MCA/PCA territory with occlusion of R internal carotid artery (by CT and duplex). Her monitor did not show atrial fibrillation. She was treated with aspirin  and clopidogrel  for 3 mos, then clopidogrel  alone. Lipid panel at time of CVA showed LDL 64, TG 59, Tchol 128, HDL 52. L carotid with only minimal disease. Echo with EF 60-65%, ?infiltrative cardiomyopathy based on appearance, small to moderate pericardial effusion, no significant valve disease.  She is recovering from knee surgery but otherwise doing well. She is now on clopidogrel  alone. Her losartan  was stopped, and her blood pressure has been well controlled.   ROS:  Denies chest pain, shortness of breath at rest. No PND, orthopnea, LE edema or unexpected weight gain. No syncope or palpitations. ROS otherwise negative except as noted.   Studies Reviewed: SABRA    EKG:       Physical Exam:   VS:  BP 130/70 (BP Location: Left Arm, Patient Position: Sitting, Cuff Size: Normal)   Pulse 82   Resp 17   Ht 5' 2 (1.575 m)   Wt 122 lb (55.3 kg)   LMP 02/24/2002   SpO2 98%   BMI 22.31 kg/m    Wt Readings from Last 3 Encounters:  11/10/23 122 lb (55.3 kg)  10/29/23 123 lb 3.2 oz (55.9 kg)  10/20/23 122 lb (55.3 kg)    GEN: Well nourished, well developed in no acute distress HEENT: Normal, moist mucous membranes NECK: No JVD CARDIAC: regular rhythm, normal S1 and S2, no rubs or gallops. 2/6 systolic murmur. VASCULAR: Radial and DP pulses 2+ bilaterally. No carotid bruits RESPIRATORY:  Clear to auscultation without rales, wheezing or rhonchi  ABDOMEN: Soft, non-tender, non-distended MUSCULOSKELETAL:  Ambulates independently SKIN: Warm and dry, no edema NEUROLOGIC:  Alert and oriented x 3. No focal neuro deficits noted. PSYCHIATRIC:  Normal affect    ASSESSMENT AND PLAN: .    CAD without angina CVA due to R internal carotid artery occlusion 07/2023 Hypercholesterolemia -antiplatelet per neurology team, per notes planned for ASA/clopidogrel  for 3 months then clopidogrel  alone -on rosuvastatin  20 mg daily, last LDL at goal of 64 -30 day monitor without definitive afib -we discussed checking lp(a), genetic component, current state of available treatments/clinical trials. She is amenable -has not had any chest pain. Prior symptom  was dyspnea on exertion, has been improved -reviewed red flag warning signs that need immediate medical attention -working to increasing activity post knee surgery -eats mediterranean diet based  LVH History of hypertension -was previously on losartan  but stopped post stroke to avoid hypotension -well controlled today -no  heart failure symptoms -we briefly discussed other tests to evaluate LVH further, but as she has had a lot occur medically recently and is asymptomatic, will defer this for now  Dispo: 6 mos or sooner as needed  Total time of encounter: I spent 45 minutes dedicated to the care of this patient on the date of this encounter to include pre-visit review of records, face-to-face time with the patient discussing conditions above, and clinical documentation with the electronic health record. We specifically spent time today discussing her CVA, findings, recommendations on medications, risk factors for heart attack and stroke.   Signed, Shelda Bruckner, MD   Shelda Bruckner, MD, PhD, [facility]  [facility]  [place]  [facility] [address] [place], [place] [address] [phone]

## 2023-11-10 NOTE — Patient Instructions (Signed)
 Medication Instructions:  Your physician recommends that you continue on your current medications as directed. Please refer to the Current Medication list given to you today.  *If you need a refill on your cardiac medications before your next appointment, please call your pharmacy*  Lab Work: NONE  Testing/Procedures: NONE  Follow-Up: At [facility], you and your health needs are our priority.  As part of our continuing mission to provide you with exceptional heart care, we have created designated Provider Care Teams.  These Care Teams include your primary Cardiologist (physician) and Advanced Practice Providers (APPs -  Physician Assistants and Nurse Practitioners) who all work together to provide you with the care you need, when you need it.  We recommend signing up for the patient portal called MyChart.  Sign up information is provided on this After Visit Summary.  MyChart is used to connect with patients for Virtual Visits (Telemedicine).  Patients are able to view lab/test results, encounter notes, upcoming appointments, etc.  Non-urgent messages can be sent to your provider as well.   To learn more about what you can do with MyChart, go to [email or web address].    Your next appointment:   6 month(s)  The format for your next appointment:   In Person  Provider:   DR LONNI RECHE ORN NP, OR ROSALINE RAMAN NP

## 2023-11-11 ENCOUNTER — Ambulatory Visit: Payer: Self-pay

## 2023-11-11 ENCOUNTER — Encounter (HOSPITAL_BASED_OUTPATIENT_CLINIC_OR_DEPARTMENT_OTHER): Payer: Self-pay | Admitting: *Deleted

## 2023-11-11 ENCOUNTER — Telehealth: Payer: Self-pay | Admitting: Radiology

## 2023-11-11 ENCOUNTER — Encounter: Payer: Self-pay | Admitting: Internal Medicine

## 2023-11-11 DIAGNOSIS — E785 Hyperlipidemia, unspecified: Secondary | ICD-10-CM | POA: Diagnosis not present

## 2023-11-11 DIAGNOSIS — E210 Primary hyperparathyroidism: Secondary | ICD-10-CM | POA: Diagnosis not present

## 2023-11-11 DIAGNOSIS — Z7982 Long term (current) use of aspirin: Secondary | ICD-10-CM | POA: Diagnosis not present

## 2023-11-11 DIAGNOSIS — I69398 Other sequelae of cerebral infarction: Secondary | ICD-10-CM | POA: Diagnosis not present

## 2023-11-11 DIAGNOSIS — S82001D Unspecified fracture of right patella, subsequent encounter for closed fracture with routine healing: Secondary | ICD-10-CM | POA: Diagnosis not present

## 2023-11-11 DIAGNOSIS — Z9181 History of falling: Secondary | ICD-10-CM | POA: Diagnosis not present

## 2023-11-11 DIAGNOSIS — Z85828 Personal history of other malignant neoplasm of skin: Secondary | ICD-10-CM | POA: Diagnosis not present

## 2023-11-11 DIAGNOSIS — R269 Unspecified abnormalities of gait and mobility: Secondary | ICD-10-CM | POA: Diagnosis not present

## 2023-11-11 DIAGNOSIS — I10 Essential (primary) hypertension: Secondary | ICD-10-CM | POA: Diagnosis not present

## 2023-11-11 DIAGNOSIS — H9190 Unspecified hearing loss, unspecified ear: Secondary | ICD-10-CM | POA: Diagnosis not present

## 2023-11-11 DIAGNOSIS — F419 Anxiety disorder, unspecified: Secondary | ICD-10-CM | POA: Diagnosis not present

## 2023-11-11 DIAGNOSIS — D649 Anemia, unspecified: Secondary | ICD-10-CM | POA: Diagnosis not present

## 2023-11-11 DIAGNOSIS — Z5982 Transportation insecurity: Secondary | ICD-10-CM | POA: Diagnosis not present

## 2023-11-11 DIAGNOSIS — I2510 Atherosclerotic heart disease of native coronary artery without angina pectoris: Secondary | ICD-10-CM | POA: Diagnosis not present

## 2023-11-11 DIAGNOSIS — Z556 Problems related to health literacy: Secondary | ICD-10-CM | POA: Diagnosis not present

## 2023-11-11 DIAGNOSIS — M810 Age-related osteoporosis without current pathological fracture: Secondary | ICD-10-CM | POA: Diagnosis not present

## 2023-11-11 DIAGNOSIS — Z7902 Long term (current) use of antithrombotics/antiplatelets: Secondary | ICD-10-CM | POA: Diagnosis not present

## 2023-11-11 NOTE — Telephone Encounter (Signed)
 FYI Only or Action Required?: FYI only for provider.  Patient was last seen in primary care on 08/25/2023 by Geofm Glade PARAS, MD.  Called Nurse Triage reporting Rectal Bleeding.  Symptoms began several weeks ago.  Interventions attempted: Nothing.  Symptoms are: gradually worsening.  Triage Disposition: See Physician Within 24 Hours  Patient/caregiver understands and will follow disposition?: Yes  Copied from CRM #[id number]. Topic: Clinical - Red Word Triage >> Nov 11, 2023 11:01 AM Berneda FALCON wrote: Red Word that prompted transfer to Nurse Triage: Patient states she has been having rectal bleeding since 8/26 and it has progressively gotten worse since then and is now having clots.  Has an appt on 10/1 with [facility] would like to be seen sooner please Reason for Disposition  MODERATE rectal bleeding (e.g., small blood clots, passing blood without stool, or toilet water turns red)  Answer Assessment - Initial Assessment Questions 1. APPEARANCE of BLOOD: What color is it? Is it passed separately, on the surface of the stool, or mixed in with the stool?      Bright red blood that  Passed separately   2. AMOUNT: How much blood was passed?      Use to be only when wiping, now passing clots  3. FREQUENCY: How many times has blood been passed with the stools?      First Bms in the morning  4. ONSET: When was the blood first seen in the stools? (Days or weeks)      Started 3 weeks  5. DIARRHEA: Is there also some diarrhea? If Yes, ask: How many diarrhea stools in the past 24 hours?      No diarrhea but has watery stools  6. CONSTIPATION: Do you have constipation? If Yes, ask: How bad is it?     No  7. RECURRENT SYMPTOMS: Have you had blood in your stools before? If Yes, ask: When was the last time? and What happened that time?      No  8. BLOOD THINNERS: Do you take any blood thinners? (e.g., aspirin , clopidogrel  / Plavix , coumadin, heparin). Notes: Other  strong blood thinners include: Arixtra (fondaparinux), Eliquis (apixaban), Pradaxa (dabigatran), and Xarelto (rivaroxaban).     No, but recently stopped taking aspirin   9. OTHER SYMPTOMS: Do you have any other symptoms?  (e.g., abdomen pain, vomiting, dizziness, fever)     No  10. PREGNANCY: Is there any chance you are pregnant? When was your last menstrual period?       No  Protocols used: Rectal Bleeding-A-AH

## 2023-11-11 NOTE — Telephone Encounter (Signed)
 She does need to go to the emergency room.  She is not on any blood thinner and her bleeding needs to be evaluated which cannot be done as an outpatient quickly.

## 2023-11-11 NOTE — Telephone Encounter (Signed)
 Copied from CRM [id number]. Topic: General - Other >> Nov 11, 2023 10:35 AM Frederich PARAS wrote: Reason for CRM: [employer] , she is calling  she says thept suffered a fracture and she would like the pcp to schedule a bone density and they will cover it. [phone]  direct # for the rep.

## 2023-11-11 NOTE — Telephone Encounter (Signed)
 Patient has been advised to go to the ED for further work up and testing.

## 2023-11-11 NOTE — Progress Notes (Deleted)
    Subjective:    Patient ID: Alicia Wilkerson, female    DOB: [birth year], 73 y.o.   MRN: [id number]      HPI Alicia Wilkerson is here for No chief complaint on file.        Medications and allergies reviewed with patient and updated if appropriate.  Current Outpatient Medications on File Prior to Visit  Medication Sig Dispense Refill   acetaminophen  (TYLENOL ) 325 MG tablet Take 2 tablets (650 mg total) by mouth every 6 (six) hours as needed for mild pain (pain score 1-3) (or Fever >/= 101).     clonazePAM  (KLONOPIN ) 0.5 MG tablet Take 1 tablet (0.5 mg total) by mouth 2 (two) times daily as needed for anxiety. 30 tablet 4   clopidogrel  (PLAVIX ) 75 MG tablet Take 1 tablet (75 mg total) by mouth daily. 90 tablet 3   escitalopram  (LEXAPRO ) 10 MG tablet Take 1 tablet (10 mg total) by mouth at bedtime. 30 tablet 5   magnesium  oxide (MAG-OX) 400 (240 Mg) MG tablet TAKE 1 TABLET BY MOUTH EVERY DAY 30 tablet 5   Omega-3 Fatty Acids  (FISH OIL ) 600 MG CAPS Take 2 capsules by mouth at bedtime. 30 capsule 0   rosuvastatin  (CRESTOR ) 20 MG tablet TAKE 1 TABLET (20 MG TOTAL) BY MOUTH DAILY. MUST KEEP SCHEDULED APPOINTMENT FOR FUTURE REFILLS. THANK YOU. 30 tablet 0   No current facility-administered medications on file prior to visit.    Review of Systems     Objective:  There were no vitals filed for this visit. BP Readings from Last 3 Encounters:  11/10/23 130/70  10/29/23 137/75  10/20/23 138/66   Wt Readings from Last 3 Encounters:  11/10/23 122 lb (55.3 kg)  10/29/23 123 lb 3.2 oz (55.9 kg)  10/20/23 122 lb (55.3 kg)   There is no height or weight on file to calculate BMI.    Physical Exam         Assessment & Plan:    See Problem List for Assessment and Plan of chronic medical problems.

## 2023-11-12 ENCOUNTER — Telehealth: Payer: Self-pay

## 2023-11-12 ENCOUNTER — Ambulatory Visit: Admitting: Internal Medicine

## 2023-11-12 NOTE — Telephone Encounter (Signed)
Will discuss during appointment next month

## 2023-11-12 NOTE — Telephone Encounter (Signed)
 Surgical clearance faxed to [facility]

## 2023-11-13 ENCOUNTER — Ambulatory Visit
Admission: RE | Admit: 2023-11-13 | Discharge: 2023-11-13 | Disposition: A | Discharge status: Home / Self Care | Source: Ambulatory Visit | Attending: Neurology | Admitting: Neurology

## 2023-11-13 DIAGNOSIS — Z9181 History of falling: Secondary | ICD-10-CM

## 2023-11-13 DIAGNOSIS — F419 Anxiety disorder, unspecified: Secondary | ICD-10-CM | POA: Diagnosis not present

## 2023-11-13 DIAGNOSIS — R4189 Other symptoms and signs involving cognitive functions and awareness: Secondary | ICD-10-CM

## 2023-11-13 DIAGNOSIS — Z556 Problems related to health literacy: Secondary | ICD-10-CM | POA: Diagnosis not present

## 2023-11-13 DIAGNOSIS — M810 Age-related osteoporosis without current pathological fracture: Secondary | ICD-10-CM | POA: Diagnosis not present

## 2023-11-13 DIAGNOSIS — H9190 Unspecified hearing loss, unspecified ear: Secondary | ICD-10-CM | POA: Diagnosis not present

## 2023-11-13 DIAGNOSIS — E785 Hyperlipidemia, unspecified: Secondary | ICD-10-CM | POA: Diagnosis not present

## 2023-11-13 DIAGNOSIS — D649 Anemia, unspecified: Secondary | ICD-10-CM | POA: Diagnosis not present

## 2023-11-13 DIAGNOSIS — E210 Primary hyperparathyroidism: Secondary | ICD-10-CM | POA: Diagnosis not present

## 2023-11-13 DIAGNOSIS — I639 Cerebral infarction, unspecified: Secondary | ICD-10-CM

## 2023-11-13 DIAGNOSIS — Z85828 Personal history of other malignant neoplasm of skin: Secondary | ICD-10-CM | POA: Diagnosis not present

## 2023-11-13 DIAGNOSIS — I69398 Other sequelae of cerebral infarction: Secondary | ICD-10-CM | POA: Diagnosis not present

## 2023-11-13 DIAGNOSIS — I10 Essential (primary) hypertension: Secondary | ICD-10-CM | POA: Diagnosis not present

## 2023-11-13 DIAGNOSIS — I2510 Atherosclerotic heart disease of native coronary artery without angina pectoris: Secondary | ICD-10-CM | POA: Diagnosis not present

## 2023-11-13 DIAGNOSIS — Z7982 Long term (current) use of aspirin: Secondary | ICD-10-CM | POA: Diagnosis not present

## 2023-11-13 DIAGNOSIS — F32A Depression, unspecified: Secondary | ICD-10-CM | POA: Diagnosis not present

## 2023-11-13 DIAGNOSIS — R269 Unspecified abnormalities of gait and mobility: Secondary | ICD-10-CM | POA: Diagnosis not present

## 2023-11-13 DIAGNOSIS — S82001D Unspecified fracture of right patella, subsequent encounter for closed fracture with routine healing: Secondary | ICD-10-CM | POA: Diagnosis not present

## 2023-11-13 DIAGNOSIS — Z7902 Long term (current) use of antithrombotics/antiplatelets: Secondary | ICD-10-CM | POA: Diagnosis not present

## 2023-11-13 DIAGNOSIS — Z5982 Transportation insecurity: Secondary | ICD-10-CM | POA: Diagnosis not present

## 2023-11-17 ENCOUNTER — Telehealth: Payer: Self-pay | Admitting: Neurology

## 2023-11-17 DIAGNOSIS — I639 Cerebral infarction, unspecified: Secondary | ICD-10-CM

## 2023-11-17 DIAGNOSIS — Z9181 History of falling: Secondary | ICD-10-CM

## 2023-11-17 DIAGNOSIS — R4189 Other symptoms and signs involving cognitive functions and awareness: Secondary | ICD-10-CM

## 2023-11-17 NOTE — Telephone Encounter (Signed)
 I called patient MRI of cervical spine showed evidence of mild degenerative changes, there is no evidence of the spinal cord or nerve root compression to explain her gait abnormality, fall risk,  Will proceed with MRI of thoracic lumbar spine   MRI cervical spine without contrast imaging: - Mild degenerative changes from C3-4 down to C7-T1.   - At C5-6 mild right foraminal stenosis.  Orders Placed This Encounter  Procedures   MR THORACIC SPINE WO CONTRAST   MR LUMBAR SPINE WO CONTRAST

## 2023-11-18 ENCOUNTER — Telehealth: Payer: Self-pay | Admitting: Neurology

## 2023-11-18 NOTE — Telephone Encounter (Signed)
 sent to [facility] [phone]

## 2023-11-23 DIAGNOSIS — Z85828 Personal history of other malignant neoplasm of skin: Secondary | ICD-10-CM | POA: Diagnosis not present

## 2023-11-24 ENCOUNTER — Encounter: Payer: Self-pay | Admitting: Internal Medicine

## 2023-11-24 NOTE — Progress Notes (Unsigned)
 Subjective:    Patient ID: Alicia Wilkerson, female    DOB: 1950/04/06, 73 y.o.   MRN: [id number]     HPI Alicia Wilkerson is here for follow up of her chronic medical problems.  Cardiology since she was here-30-day monitor that she wore without definitive atrial fibrillation  Saw neurology-stroke likely related to failure of collaterals and chronic occlusion of the right internal carotid artery.  Bowels not well formed and has had some blood when she wipes.  Only on the TP - not in stool.    Medications and allergies reviewed with patient and updated if appropriate.  Current Outpatient Medications on File Prior to Visit  Medication Sig Dispense Refill   acetaminophen  (TYLENOL ) 325 MG tablet Take 2 tablets (650 mg total) by mouth every 6 (six) hours as needed for mild pain (pain score 1-3) (or Fever >/= 101).     clonazePAM  (KLONOPIN ) 0.5 MG tablet Take 1 tablet (0.5 mg total) by mouth 2 (two) times daily as needed for anxiety. 30 tablet 4   clopidogrel  (PLAVIX ) 75 MG tablet Take 1 tablet (75 mg total) by mouth daily. 90 tablet 3   escitalopram  (LEXAPRO ) 10 MG tablet Take 1 tablet (10 mg total) by mouth at bedtime. 30 tablet 5   magnesium  oxide (MAG-OX) 400 (240 Mg) MG tablet TAKE 1 TABLET BY MOUTH EVERY DAY 30 tablet 5   Omega-3 Fatty Acids  (FISH OIL ) 600 MG CAPS Take 2 capsules by mouth at bedtime. 30 capsule 0   rosuvastatin  (CRESTOR ) 20 MG tablet TAKE 1 TABLET (20 MG TOTAL) BY MOUTH DAILY. MUST KEEP SCHEDULED APPOINTMENT FOR FUTURE REFILLS. THANK YOU. 30 tablet 0   No current facility-administered medications on file prior to visit.     Review of Systems  Constitutional:  Negative for fever.  Respiratory:  Negative for cough, shortness of breath and wheezing.   Cardiovascular:  Negative for chest pain, palpitations and leg swelling.  Gastrointestinal:  Positive for anal bleeding. Negative for abdominal pain, blood in stool, constipation and diarrhea (loose stools).  Neurological:   Negative for dizziness, light-headedness and headaches.  Hematological:  Bruises/bleeds easily.  Psychiatric/Behavioral:  Negative for sleep disturbance.        Objective:   Vitals:   11/25/23 1344  BP: 134/70  Pulse: 60  Temp: 98 F (36.7 C)  SpO2: 98%   BP Readings from Last 3 Encounters:  11/25/23 134/70  11/10/23 130/70  10/29/23 137/75   Wt Readings from Last 3 Encounters:  11/25/23 125 lb (56.7 kg)  11/10/23 122 lb (55.3 kg)  10/29/23 123 lb 3.2 oz (55.9 kg)   Body mass index is 22.86 kg/m.    Physical Exam Constitutional:      General: She is not in acute distress.    Appearance: Normal appearance.  HENT:     Head: Normocephalic and atraumatic.  Eyes:     Conjunctiva/sclera: Conjunctivae normal.  Cardiovascular:     Rate and Rhythm: Normal rate and regular rhythm.     Heart sounds: Normal heart sounds.  Pulmonary:     Effort: Pulmonary effort is normal. No respiratory distress.     Breath sounds: Normal breath sounds. No wheezing.  Musculoskeletal:     Cervical back: Neck supple.     Right lower leg: No edema.     Left lower leg: No edema.  Lymphadenopathy:     Cervical: No cervical adenopathy.  Skin:    General: Skin is warm and dry.  Findings: No rash.  Neurological:     Mental Status: She is alert. Mental status is at baseline.  Psychiatric:        Mood and Affect: Mood normal.        Behavior: Behavior normal.        Lab Results  Component Value Date   WBC 8.8 08/14/2023   HGB 11.7 (L) 08/14/2023   HCT 33.7 (L) 08/14/2023   PLT 306 08/14/2023   GLUCOSE 90 10/19/2023   CHOL 128 08/11/2023   TRIG 59 08/11/2023   HDL 52 08/11/2023   LDLCALC 64 08/11/2023   ALT 16 08/14/2023   AST 17 08/14/2023   NA 139 10/19/2023   K 4.1 10/19/2023   CL 102 10/19/2023   CREATININE 0.54 10/19/2023   BUN 13 10/19/2023   CO2 26 10/19/2023   TSH 1.594 08/10/2023   INR 1.0 08/09/2023   HGBA1C 3.7 (L) 08/10/2023     Assessment & Plan:     See Problem List for Assessment and Plan of chronic medical problems.

## 2023-11-24 NOTE — Patient Instructions (Incomplete)
    Flu immunization administered today.     Blood work was ordered.       Medications changes include :   start benefiber or metamucil 1-2 tsp daily for more solid, less frequently stools.  Hydrocortisone suppositories twice daily as needed.  Increase fiber in diet.      Return in about 6 months (around 05/25/2024) for follow up.

## 2023-11-25 ENCOUNTER — Other Ambulatory Visit: Payer: Self-pay

## 2023-11-25 ENCOUNTER — Ambulatory Visit (INDEPENDENT_AMBULATORY_CARE_PROVIDER_SITE_OTHER): Admitting: Internal Medicine

## 2023-11-25 VITALS — BP 134/70 | HR 60 | Temp 98.0°F | Ht 62.0 in | Wt 125.0 lb

## 2023-11-25 DIAGNOSIS — I10 Essential (primary) hypertension: Secondary | ICD-10-CM

## 2023-11-25 DIAGNOSIS — Z7902 Long term (current) use of antithrombotics/antiplatelets: Secondary | ICD-10-CM | POA: Diagnosis not present

## 2023-11-25 DIAGNOSIS — E538 Deficiency of other specified B group vitamins: Secondary | ICD-10-CM | POA: Diagnosis not present

## 2023-11-25 DIAGNOSIS — S82001D Unspecified fracture of right patella, subsequent encounter for closed fracture with routine healing: Secondary | ICD-10-CM | POA: Diagnosis not present

## 2023-11-25 DIAGNOSIS — R4189 Other symptoms and signs involving cognitive functions and awareness: Secondary | ICD-10-CM

## 2023-11-25 DIAGNOSIS — K648 Other hemorrhoids: Secondary | ICD-10-CM

## 2023-11-25 DIAGNOSIS — H9190 Unspecified hearing loss, unspecified ear: Secondary | ICD-10-CM | POA: Diagnosis not present

## 2023-11-25 DIAGNOSIS — R269 Unspecified abnormalities of gait and mobility: Secondary | ICD-10-CM | POA: Diagnosis not present

## 2023-11-25 DIAGNOSIS — I2510 Atherosclerotic heart disease of native coronary artery without angina pectoris: Secondary | ICD-10-CM | POA: Diagnosis not present

## 2023-11-25 DIAGNOSIS — I69398 Other sequelae of cerebral infarction: Secondary | ICD-10-CM | POA: Diagnosis not present

## 2023-11-25 DIAGNOSIS — I69319 Unspecified symptoms and signs involving cognitive functions following cerebral infarction: Secondary | ICD-10-CM

## 2023-11-25 DIAGNOSIS — D649 Anemia, unspecified: Secondary | ICD-10-CM

## 2023-11-25 DIAGNOSIS — Z556 Problems related to health literacy: Secondary | ICD-10-CM | POA: Diagnosis not present

## 2023-11-25 DIAGNOSIS — F419 Anxiety disorder, unspecified: Secondary | ICD-10-CM | POA: Diagnosis not present

## 2023-11-25 DIAGNOSIS — E782 Mixed hyperlipidemia: Secondary | ICD-10-CM

## 2023-11-25 DIAGNOSIS — E559 Vitamin D deficiency, unspecified: Secondary | ICD-10-CM | POA: Diagnosis not present

## 2023-11-25 DIAGNOSIS — Z9181 History of falling: Secondary | ICD-10-CM | POA: Diagnosis not present

## 2023-11-25 DIAGNOSIS — E210 Primary hyperparathyroidism: Secondary | ICD-10-CM | POA: Diagnosis not present

## 2023-11-25 DIAGNOSIS — E785 Hyperlipidemia, unspecified: Secondary | ICD-10-CM | POA: Diagnosis not present

## 2023-11-25 DIAGNOSIS — Z85828 Personal history of other malignant neoplasm of skin: Secondary | ICD-10-CM | POA: Diagnosis not present

## 2023-11-25 DIAGNOSIS — Z5982 Transportation insecurity: Secondary | ICD-10-CM | POA: Diagnosis not present

## 2023-11-25 DIAGNOSIS — M810 Age-related osteoporosis without current pathological fracture: Secondary | ICD-10-CM | POA: Diagnosis not present

## 2023-11-25 DIAGNOSIS — Z23 Encounter for immunization: Secondary | ICD-10-CM

## 2023-11-25 DIAGNOSIS — Z7982 Long term (current) use of aspirin: Secondary | ICD-10-CM | POA: Diagnosis not present

## 2023-11-25 DIAGNOSIS — F32A Depression, unspecified: Secondary | ICD-10-CM | POA: Diagnosis not present

## 2023-11-25 MED ORDER — HYDROCORTISONE ACETATE 25 MG RE SUPP: 25.0000 mg | Freq: Two times a day (BID) | RECTAL | 0 refills | Status: AC

## 2023-11-25 NOTE — Assessment & Plan Note (Signed)
 Mild anemia with last blood work which was June/2025 when she was in the hospital CBC

## 2023-11-25 NOTE — Assessment & Plan Note (Addendum)
 Chronic Following with cardiology On aspirin  81 mg daily, Crestor  20 mg daily

## 2023-11-25 NOTE — Assessment & Plan Note (Addendum)
 Chronic Lab Results  Component Value Date   LDLCALC 64 08/11/2023   Continue Crestor  20 mg daily Check lipid panel, CMP

## 2023-11-25 NOTE — Assessment & Plan Note (Signed)
 Chronic Taking vitamin D daily Check vitamin D level

## 2023-11-25 NOTE — Assessment & Plan Note (Addendum)
 Chronic Blood pressure well controlled Continue losartan  12.5 mg daily CMP, CBC

## 2023-11-25 NOTE — Assessment & Plan Note (Signed)
 Chronic Not currently taking B12 supplementation-level was low a few months ago Will check B12 level Advised that she will need to start taking B12 and if concerns with low B12 level

## 2023-11-25 NOTE — Assessment & Plan Note (Signed)
 [place] cognitive assessment 10/29/23 with neurology: 27/30

## 2023-11-25 NOTE — Assessment & Plan Note (Signed)
 Acute Experiencing rectal bleeding-blood on the tissue when she wipes She is having several loose stools a day which is unusual for her and has a history of hemorrhoids which is likely the cause of the bleeding Advised taking Metamucil or Benefiber 1-2 teaspoons daily to help solidify the stool and decrease the number of stools she is having the day She will also start to increase fiber in her diet If bleeding does not stop advised that she will need to see Dr. Kristie Last colonoscopy 06/2021-reviewed

## 2023-11-25 NOTE — Assessment & Plan Note (Signed)
 Multiple infarcts noted on MRI brain 08/08/2023 Infarcts likely related to occlusion of right ICA Some residual cognitive deficits-improved and testing is near normal Has seen cardiology and neurology 30-day Holter monitor showed no A-fib  continue Plavix  75 mg daily, Crestor  20 mg daily Check lipid panel, CBC, CMP

## 2023-11-26 ENCOUNTER — Ambulatory Visit: Payer: Self-pay | Admitting: Internal Medicine

## 2023-11-26 LAB — CBC WITH DIFFERENTIAL/PLATELET
Basophils Absolute: 0.1 K/uL (ref 0.0–0.1)
Basophils Relative: 1.4 % (ref 0.0–3.0)
Eosinophils Absolute: 0.2 K/uL (ref 0.0–0.7)
Eosinophils Relative: 2 % (ref 0.0–5.0)
HCT: 37.2 % (ref 36.0–46.0)
Hemoglobin: 13.3 g/dL (ref 12.0–15.0)
Lymphocytes Relative: 12.7 % (ref 12.0–46.0)
Lymphs Abs: 1.1 K/uL (ref 0.7–4.0)
MCHC: 35.8 g/dL (ref 30.0–36.0)
MCV: 99.7 fl (ref 78.0–100.0)
Monocytes Absolute: 0.9 K/uL (ref 0.1–1.0)
Monocytes Relative: 10.2 % (ref 3.0–12.0)
Neutro Abs: 6.6 K/uL (ref 1.4–7.7)
Neutrophils Relative %: 73.7 % (ref 43.0–77.0)
Platelets: 292 K/uL (ref 150.0–400.0)
RBC: 3.73 Mil/uL — ABNORMAL LOW (ref 3.87–5.11)
RDW: 16.4 % — ABNORMAL HIGH (ref 11.5–15.5)
WBC: 8.9 K/uL (ref 4.0–10.5)

## 2023-11-26 LAB — COMPREHENSIVE METABOLIC PANEL WITH GFR
ALT: 26 U/L (ref 0–35)
AST: 32 U/L (ref 0–37)
Albumin: 4.3 g/dL (ref 3.5–5.2)
Alkaline Phosphatase: 85 U/L (ref 39–117)
BUN: 19 mg/dL (ref 6–23)
CO2: 29 meq/L (ref 19–32)
Calcium: 9.8 mg/dL (ref 8.4–10.5)
Chloride: 104 meq/L (ref 96–112)
Creatinine, Ser: 0.62 mg/dL (ref 0.40–1.20)
GFR: 89.48 mL/min (ref >60.00)
Glucose, Bld: 84 mg/dL (ref 70–99)
Potassium: 4.2 meq/L (ref 3.5–5.1)
Sodium: 139 meq/L (ref 135–145)
Total Bilirubin: 0.9 mg/dL (ref 0.2–1.2)
Total Protein: 7.5 g/dL (ref 6.0–8.3)

## 2023-11-26 LAB — LIPID PANEL
Cholesterol: 143 mg/dL (ref 0–200)
HDL: 73.9 mg/dL (ref >39.00)
LDL Cholesterol: 59 mg/dL (ref 0–99)
NonHDL: 69.38
Total CHOL/HDL Ratio: 2
Triglycerides: 51 mg/dL (ref 0.0–149.0)
VLDL: 10.2 mg/dL (ref 0.0–40.0)

## 2023-11-26 LAB — VITAMIN D 25 HYDROXY (VIT D DEFICIENCY, FRACTURES): VITD: 25.3 ng/mL — ABNORMAL LOW (ref 30.00–100.00)

## 2023-11-26 LAB — VITAMIN B12: Vitamin B-12: 366 pg/mL (ref 211–911)

## 2023-11-28 ENCOUNTER — Other Ambulatory Visit: Payer: Self-pay | Admitting: Nurse Practitioner

## 2023-11-30 DIAGNOSIS — D649 Anemia, unspecified: Secondary | ICD-10-CM | POA: Diagnosis not present

## 2023-11-30 DIAGNOSIS — Z85828 Personal history of other malignant neoplasm of skin: Secondary | ICD-10-CM | POA: Diagnosis not present

## 2023-11-30 DIAGNOSIS — Z5982 Transportation insecurity: Secondary | ICD-10-CM | POA: Diagnosis not present

## 2023-11-30 DIAGNOSIS — F419 Anxiety disorder, unspecified: Secondary | ICD-10-CM | POA: Diagnosis not present

## 2023-11-30 DIAGNOSIS — H9190 Unspecified hearing loss, unspecified ear: Secondary | ICD-10-CM | POA: Diagnosis not present

## 2023-11-30 DIAGNOSIS — Z7902 Long term (current) use of antithrombotics/antiplatelets: Secondary | ICD-10-CM | POA: Diagnosis not present

## 2023-11-30 DIAGNOSIS — I69398 Other sequelae of cerebral infarction: Secondary | ICD-10-CM | POA: Diagnosis not present

## 2023-11-30 DIAGNOSIS — M810 Age-related osteoporosis without current pathological fracture: Secondary | ICD-10-CM | POA: Diagnosis not present

## 2023-11-30 DIAGNOSIS — F32A Depression, unspecified: Secondary | ICD-10-CM | POA: Diagnosis not present

## 2023-11-30 DIAGNOSIS — E785 Hyperlipidemia, unspecified: Secondary | ICD-10-CM | POA: Diagnosis not present

## 2023-11-30 DIAGNOSIS — R269 Unspecified abnormalities of gait and mobility: Secondary | ICD-10-CM | POA: Diagnosis not present

## 2023-11-30 DIAGNOSIS — E210 Primary hyperparathyroidism: Secondary | ICD-10-CM | POA: Diagnosis not present

## 2023-11-30 DIAGNOSIS — I10 Essential (primary) hypertension: Secondary | ICD-10-CM | POA: Diagnosis not present

## 2023-11-30 DIAGNOSIS — I2510 Atherosclerotic heart disease of native coronary artery without angina pectoris: Secondary | ICD-10-CM | POA: Diagnosis not present

## 2023-11-30 DIAGNOSIS — S82001D Unspecified fracture of right patella, subsequent encounter for closed fracture with routine healing: Secondary | ICD-10-CM | POA: Diagnosis not present

## 2023-11-30 DIAGNOSIS — Z9181 History of falling: Secondary | ICD-10-CM | POA: Diagnosis not present

## 2023-11-30 DIAGNOSIS — Z556 Problems related to health literacy: Secondary | ICD-10-CM | POA: Diagnosis not present

## 2023-11-30 DIAGNOSIS — Z7982 Long term (current) use of aspirin: Secondary | ICD-10-CM | POA: Diagnosis not present

## 2023-11-30 LAB — LIPOPROTEIN A (LPA): Lipoprotein (a): 165 nmol/L — ABNORMAL HIGH (ref <75)

## 2023-12-01 DIAGNOSIS — Z4889 Encounter for other specified surgical aftercare: Secondary | ICD-10-CM | POA: Diagnosis not present

## 2023-12-07 DIAGNOSIS — Z85828 Personal history of other malignant neoplasm of skin: Secondary | ICD-10-CM | POA: Diagnosis not present

## 2023-12-07 DIAGNOSIS — I2510 Atherosclerotic heart disease of native coronary artery without angina pectoris: Secondary | ICD-10-CM | POA: Diagnosis not present

## 2023-12-07 DIAGNOSIS — E210 Primary hyperparathyroidism: Secondary | ICD-10-CM | POA: Diagnosis not present

## 2023-12-07 DIAGNOSIS — Z9181 History of falling: Secondary | ICD-10-CM | POA: Diagnosis not present

## 2023-12-07 DIAGNOSIS — Z7982 Long term (current) use of aspirin: Secondary | ICD-10-CM | POA: Diagnosis not present

## 2023-12-07 DIAGNOSIS — F32A Depression, unspecified: Secondary | ICD-10-CM | POA: Diagnosis not present

## 2023-12-07 DIAGNOSIS — R269 Unspecified abnormalities of gait and mobility: Secondary | ICD-10-CM | POA: Diagnosis not present

## 2023-12-07 DIAGNOSIS — H9190 Unspecified hearing loss, unspecified ear: Secondary | ICD-10-CM | POA: Diagnosis not present

## 2023-12-07 DIAGNOSIS — M810 Age-related osteoporosis without current pathological fracture: Secondary | ICD-10-CM | POA: Diagnosis not present

## 2023-12-07 DIAGNOSIS — I69398 Other sequelae of cerebral infarction: Secondary | ICD-10-CM | POA: Diagnosis not present

## 2023-12-07 DIAGNOSIS — F419 Anxiety disorder, unspecified: Secondary | ICD-10-CM | POA: Diagnosis not present

## 2023-12-07 DIAGNOSIS — Z5982 Transportation insecurity: Secondary | ICD-10-CM | POA: Diagnosis not present

## 2023-12-07 DIAGNOSIS — D649 Anemia, unspecified: Secondary | ICD-10-CM | POA: Diagnosis not present

## 2023-12-07 DIAGNOSIS — S82001D Unspecified fracture of right patella, subsequent encounter for closed fracture with routine healing: Secondary | ICD-10-CM | POA: Diagnosis not present

## 2023-12-07 DIAGNOSIS — Z556 Problems related to health literacy: Secondary | ICD-10-CM | POA: Diagnosis not present

## 2023-12-07 DIAGNOSIS — E785 Hyperlipidemia, unspecified: Secondary | ICD-10-CM | POA: Diagnosis not present

## 2023-12-07 DIAGNOSIS — Z7902 Long term (current) use of antithrombotics/antiplatelets: Secondary | ICD-10-CM | POA: Diagnosis not present

## 2023-12-07 DIAGNOSIS — I10 Essential (primary) hypertension: Secondary | ICD-10-CM | POA: Diagnosis not present

## 2023-12-08 ENCOUNTER — Telehealth: Payer: Self-pay

## 2023-12-08 NOTE — Telephone Encounter (Signed)
 Copied from CRM [id number]. Topic: Clinical - Request for Lab/Test Order >> Dec 08, 2023 12:44 PM Shereese L wrote: Reason for CRM: [facility] calling in to confirm that a bone density order was out in for the patient.. Order for bone sent in on 09/17 and adv her to refax the order

## 2023-12-09 NOTE — Telephone Encounter (Signed)
 Signed plan of care orders faxedto home health at [phone]

## 2023-12-15 ENCOUNTER — Encounter: Payer: Self-pay | Admitting: Neurology

## 2023-12-17 NOTE — Telephone Encounter (Signed)
 Copied from CRM [id number]. Topic: Clinical - Request for Lab/Test Order >> Dec 08, 2023 12:44 PM Shereese L wrote: Reason for CRM: [facility] calling in to confirm that a bone density order was out in for the patient.. Order for bone sent in on 09/17 and adv her to refax the order >> Dec 17, 2023  1:00 PM [place] J wrote: Saratha calling from [place] to see if Dr. Geofm could place the bone density order. They have been requesting this since September and are needing a response as soon as possible. Please call [phone] once there is any update.

## 2023-12-17 NOTE — Telephone Encounter (Signed)
 Copied from CRM [id number]. Topic: Clinical - Request for Lab/Test Order >> Dec 08, 2023 12:44 PM Shereese L wrote: Reason for CRM: [facility] calling in to confirm that a bone density order was out in for the patient.. Order for bone sent in on 09/17 and adv her to refax the order

## 2023-12-21 ENCOUNTER — Ambulatory Visit
Admission: RE | Admit: 2023-12-21 | Discharge: 2023-12-21 | Disposition: A | Discharge status: Home / Self Care | Source: Ambulatory Visit | Attending: Neurology | Admitting: Neurology

## 2023-12-21 DIAGNOSIS — I639 Cerebral infarction, unspecified: Secondary | ICD-10-CM

## 2023-12-21 DIAGNOSIS — R4189 Other symptoms and signs involving cognitive functions and awareness: Secondary | ICD-10-CM | POA: Diagnosis not present

## 2023-12-21 DIAGNOSIS — Z9181 History of falling: Secondary | ICD-10-CM

## 2023-12-23 DIAGNOSIS — M25661 Stiffness of right knee, not elsewhere classified: Secondary | ICD-10-CM | POA: Diagnosis not present

## 2023-12-24 ENCOUNTER — Ambulatory Visit: Payer: Self-pay | Admitting: Neurology

## 2023-12-29 ENCOUNTER — Ambulatory Visit (HOSPITAL_BASED_OUTPATIENT_CLINIC_OR_DEPARTMENT_OTHER): Admitting: Physical Therapy

## 2023-12-30 ENCOUNTER — Telehealth: Payer: Self-pay

## 2023-12-30 NOTE — Telephone Encounter (Signed)
 Copied from CRM [id number]. Topic: Clinical - Request for Lab/Test Order >> Dec 08, 2023 12:44 PM Shereese L wrote: Reason for CRM: [facility] calling in to confirm that a bone density order was out in for the patient.. Order for bone sent in on 09/17 and adv her to refax the order >> Dec 30, 2023  4:44 PM China J wrote: Saratha calling from [place], requesting that a bone density order is place ASAP. She has been requesting this since last month.   Please call: [phone] >> Dec 17, 2023  1:00 PM China J wrote: Saratha calling from [place] to see if Dr. Geofm could place the bone density order. They have been requesting this since September and are needing a response as soon as possible. Please call [phone] once there is any update.

## 2023-12-31 NOTE — Telephone Encounter (Signed)
 Spoke with patient and she declines to have Bone scan done.  Therefore no bone scan will be ordered at this current time.

## 2023-12-31 NOTE — Telephone Encounter (Signed)
 Message left for Alicia Wilkerson that bonescan will not be ordered.

## 2023-12-31 NOTE — Telephone Encounter (Signed)
 I believe the last time I talked to the patient and she was not interested in having a bone density.  Can we call her and double check on this.  Do not want to order something if she does not want it.

## 2024-01-05 ENCOUNTER — Ambulatory Visit (HOSPITAL_BASED_OUTPATIENT_CLINIC_OR_DEPARTMENT_OTHER): Payer: Self-pay | Attending: Physical Medicine and Rehabilitation | Admitting: Physical Therapy

## 2024-01-05 DIAGNOSIS — I63511 Cerebral infarction due to unspecified occlusion or stenosis of right middle cerebral artery: Secondary | ICD-10-CM | POA: Insufficient documentation

## 2024-01-05 DIAGNOSIS — M810 Age-related osteoporosis without current pathological fracture: Secondary | ICD-10-CM | POA: Insufficient documentation

## 2024-01-05 DIAGNOSIS — I639 Cerebral infarction, unspecified: Secondary | ICD-10-CM | POA: Insufficient documentation

## 2024-01-05 NOTE — Therapy (Deleted)
 OUTPATIENT PHYSICAL THERAPY THORACOLUMBAR EVALUATION   Patient Name: Alicia Wilkerson MRN: [id number] DOB:January 31, 1951, 73 y.o., female Today's Date: 01/05/2024  END OF SESSION:   Past Medical History:  Diagnosis Date   Anemia    Anxiety disorder    Basal cell carcinoma (BCC) of left upper arm 12/2015   Basal cell carcinoma (BCC) of right lower leg 04/2016   Borderline hypertension    CAD (coronary artery disease)    a. nonobst by cor CT 12/2018.   CIN II (cervical intraepithelial neoplasia II)    CIN  1   Depression    Eczema    Family history of malignant neoplasm of gastrointestinal tract 08/14/2020   Family history of premature CAD    Hard of hearing    wears hearing aids   History of basal cell carcinoma (BCC) excision    left upper arm 2017;   right lower leg 2018;  bilateral lower leg and hip area 2019;   inner right lower leg, outer right thigh, & upper outside left arm 09/ 2020   History of cervical dysplasia 2013   History of hereditary spherocytosis    s/p splenectomy in 1954   History of hyperparathyroidism    per pt yrs ago was put on mega dose of vit d which caused the hyperparathyroid resolved after stopping vit d   History of pelvic fracture 2013   Ischemic stroke (HCC) 08/07/2023   Left inguinal hernia 01/02/2023   MAI (mycobacterium avium-intracellulare) (HCC)    ? possibly by CT 12/2018   Mild hyperlipidemia    Osteoporosis    PONV (postoperative nausea and vomiting)    S/P hernia repair 03/02/2023   S/P splenectomy    spherocytosis   Wears glasses    White coat syndrome without diagnosis of hypertension    Past Surgical History:  Procedure Laterality Date   ANKLE SURGERY  03/14/2015   BREAST BIOPSY Left 08/30/2021   CYSTIC APOCRINE METAPLASIA   CERVICAL CONIZATION W/BX N/A 12/13/2018   Procedure: CONIZATION CERVIX WITH BIOPSY;  Surgeon: Cleotilde Ronal RAMAN, MD;  Location: [facility] OR;  Service: Gynecology;  Laterality: N/A;   CYSTOSCOPY N/A 10/18/2019    Procedure: CYSTOSCOPY;  Surgeon: Cleotilde Ronal RAMAN, MD;  Location: [facility];  Service: Gynecology;  Laterality: N/A;   IR RADIOLOGY PERIPHERAL GUIDED IV START  01/05/2019   IR US  GUIDE VASC ACCESS RIGHT  01/05/2019   KNEE SURGERY Left 1991   per pt retained hardward   LEEP N/A 12/13/2018   Procedure: possible LOOP ELECTROSURGICAL EXCISION PROCEDURE (LEEP);  Surgeon: Cleotilde Ronal RAMAN, MD;  Location: [facility] OR;  Service: Gynecology;  Laterality: N/A;   ORIF ANKLE FRACTURE Left 03/14/2015   per pt retained hardware   ORIF PATELLA Right 10/19/2023   Procedure: OPEN REDUCTION INTERNAL FIXATION (ORIF) PATELLA;  Surgeon: Kay Kemps, MD;  Location: [facility];  Service: Orthopedics;  Laterality: Right;   ORIF WRIST FRACTURE Left 03/10/2022   Procedure: OPEN REDUCTION INTERNAL FIXATION (ORIF) WRIST FRACTURE;  Surgeon: Shari Easter, MD;  Location: [facility] OR;  Service: Orthopedics;  Laterality: Left;  regional with iv sedation   SPLENECTOMY, TOTAL  1954   due to spherocytosis   TOTAL LAPAROSCOPIC HYSTERECTOMY WITH SALPINGECTOMY N/A 10/18/2019   Procedure: TOTAL LAPAROSCOPIC HYSTERECTOMY WITH BILATERAL SALPINGECTOMY AND BILATERAL OOPHORECTOMY;  Surgeon: Cleotilde Ronal RAMAN, MD;  Location: [facility];  Service: Gynecology;  Laterality: N/A;  BILATERAL SALPINGECTOMY POSS OOPHORECTOMY   TUBAL LIGATION Bilateral 1995   Patient  Active Problem List   Diagnosis Date Noted   B12 deficiency 11/25/2023   Internal hemorrhoids 11/25/2023   Anemia 11/25/2023   History of CVA (cerebrovascular accident) 11/10/2023   Cognitive impairment 10/29/2023   At risk for falls 10/29/2023   Right patella fracture 10/19/2023   Carotid artery disease 08/25/2023   Essential (primary) hypertension 08/13/2023   ICAO (internal carotid artery occlusion), right 08/12/2023   PSVT (paroxysmal supraventricular tachycardia) 08/11/2023   Residual cognitive deficit as late effect of cerebrovascular accident 08/09/2023    Lower back pain 01/02/2023   Vitamin D  deficiency 11/24/2022   Aortic atherosclerosis 11/04/2019   HLD (hyperlipidemia) 11/03/2019   CAD (coronary artery disease) 11/03/2019   S/P total hysterectomy and bilateral salpingo-oophorectomy, 2021 11/03/2019   Hearing loss 10/13/2017   History of basal cell carcinoma (BCC) 05/19/2017   Anxiety 10/10/2016   Eczema 10/10/2016   H/O splenectomy for hemolytic spherocytosis 10/10/2016   H/O basal cell carcinoma excision 10/10/2016    PCP: ***  REFERRING PROVIDER: ***  REFERRING DIAG:  M81.0 (ICD-10-CM) - Osteoporosis, unspecified osteoporosis type, unspecified pathological fracture presence  I63.9 (ICD-10-CM) - Cerebrovascular accident (CVA), unspecified mechanism (HCC)  I63.511 (ICD-10-CM) - Right middle cerebral artery stroke (HCC)    Rationale for Evaluation and Treatment: Rehabilitation  THERAPY DIAG:  No diagnosis found.  ONSET DATE: ***  SUBJECTIVE:                                                                                                                                                                                           SUBJECTIVE STATEMENT: Back in June I had a stroke.  Broke my petella in Aug, h  PERTINENT HISTORY:  ***  PAIN:  Are you having pain? {OPRCPAIN:27236}  PRECAUTIONS: {Therapy precautions:24002}  RED FLAGS: {PT Red Flags:29287}   WEIGHT BEARING RESTRICTIONS: {Yes ***/No:24003}  FALLS:  Has patient fallen in last 6 months? {fallsyesno:27318}  LIVING ENVIRONMENT: Lives with: {OPRC lives with:25569::lives with their family} Lives in: {Lives in:25570} Stairs: {opstairs:27293} Has following equipment at home: {Assistive devices:23999}  OCCUPATION: ***  PLOF: {PLOF:24004}  PATIENT GOALS: ***  NEXT MD VISIT: ***  OBJECTIVE:  Note: Objective measures were completed at Evaluation unless otherwise noted.  DIAGNOSTIC FINDINGS:  ***  PATIENT SURVEYS:  {rehab  surveys:24030}  COGNITION: Overall cognitive status: {cognition:24006}     SENSATION: {sensation:27233}  MUSCLE LENGTH: Hamstrings: Right *** deg; Left *** deg Debby test: Right *** deg; Left *** deg  POSTURE: {posture:25561}  PALPATION: ***  LUMBAR ROM:   AROM eval  Flexion   Extension   Right lateral flexion   Left lateral flexion   Right  rotation   Left rotation    (Blank rows = not tested)  LOWER EXTREMITY ROM:     {AROM/PROM:27142}  Right eval Left eval  Hip flexion    Hip extension    Hip abduction    Hip adduction    Hip internal rotation    Hip external rotation    Knee flexion    Knee extension    Ankle dorsiflexion    Ankle plantarflexion    Ankle inversion    Ankle eversion     (Blank rows = not tested)  LOWER EXTREMITY MMT:    MMT Right eval Left eval  Hip flexion    Hip extension    Hip abduction    Hip adduction    Hip internal rotation    Hip external rotation    Knee flexion    Knee extension    Ankle dorsiflexion    Ankle plantarflexion    Ankle inversion    Ankle eversion     (Blank rows = not tested)  LUMBAR SPECIAL TESTS:  {lumbar special test:25242}  FUNCTIONAL TESTS:  {Functional tests:24029}  GAIT: Distance walked: *** Assistive device utilized: {Assistive devices:23999} Level of assistance: {Levels of assistance:24026} Comments: ***  TREATMENT DATE: ***                                                                                                                                 PATIENT EDUCATION:  Education details: *** Person educated: {Person educated:25204} Education method: {Education Method:25205} Education comprehension: {Education Comprehension:25206}  HOME EXERCISE PROGRAM: ***  ASSESSMENT:  CLINICAL IMPRESSION: Patient is a *** y.o. *** who was seen today for physical therapy evaluation and treatment for ***.   OBJECTIVE IMPAIRMENTS: {opptimpairments:25111}.   ACTIVITY LIMITATIONS:  {activitylimitations:27494}  PARTICIPATION LIMITATIONS: {participationrestrictions:25113}  PERSONAL FACTORS: {Personal factors:25162} are also affecting patient's functional outcome.   REHAB POTENTIAL: {rehabpotential:25112}  CLINICAL DECISION MAKING: {clinical decision making:25114}  EVALUATION COMPLEXITY: {Evaluation complexity:25115}   GOALS: Goals reviewed with patient? {yes/no:20286}  SHORT TERM GOALS: Target date: ***  *** Baseline: Goal status: INITIAL  2.  *** Baseline:  Goal status: INITIAL  3.  *** Baseline:  Goal status: INITIAL  4.  *** Baseline:  Goal status: INITIAL  5.  *** Baseline:  Goal status: INITIAL  6.  *** Baseline:  Goal status: INITIAL  LONG TERM GOALS: Target date: ***  *** Baseline:  Goal status: INITIAL  2.  *** Baseline:  Goal status: INITIAL  3.  *** Baseline:  Goal status: INITIAL  4.  *** Baseline:  Goal status: INITIAL  5.  *** Baseline:  Goal status: INITIAL  6.  *** Baseline:  Goal status: INITIAL  PLAN:  PT FREQUENCY: {rehab frequency:25116}  PT DURATION: {rehab duration:25117}  PLANNED INTERVENTIONS: {rehab planned interventions:25118::97110-Therapeutic exercises,97530- Therapeutic [phone]- Neuromuscular re-education,97535- Self Rjmz,02859- Manual therapy,Patient/Family education}.  PLAN FOR NEXT SESSION: ***   Frankie Cohl Behrens, PT 01/05/2024, 2:46 PM

## 2024-01-05 NOTE — Therapy (Signed)
 Pt arrives for eval visit. She reports she is receiving services for OPPT at another clinic so has decided to defer aquatics until completion of other to avoid possible insurance denial.  She is scheduled for aquatic PT assessment in 1 month  Ronal Foots) [place] MPT 01/05/24 4:57 PM [facility] [address] [place], [place], [address] Phone: [phone]   Fax:  [phone]

## 2024-01-06 DIAGNOSIS — M25661 Stiffness of right knee, not elsewhere classified: Secondary | ICD-10-CM | POA: Diagnosis not present

## 2024-01-06 DIAGNOSIS — Z0100 Encounter for examination of eyes and vision without abnormal findings: Secondary | ICD-10-CM | POA: Diagnosis not present

## 2024-01-12 DIAGNOSIS — M25661 Stiffness of right knee, not elsewhere classified: Secondary | ICD-10-CM | POA: Diagnosis not present

## 2024-01-12 DIAGNOSIS — Z4889 Encounter for other specified surgical aftercare: Secondary | ICD-10-CM | POA: Diagnosis not present

## 2024-02-07 ENCOUNTER — Encounter (HOSPITAL_BASED_OUTPATIENT_CLINIC_OR_DEPARTMENT_OTHER): Payer: Self-pay | Admitting: Obstetrics & Gynecology

## 2024-02-07 NOTE — Progress Notes (Unsigned)
 OUTPATIENT PHYSICAL THERAPY THORACOLUMBAR EVALUATION   Patient Name: Alicia Wilkerson MRN: [id number] DOB:1950-05-19, 73 y.o., female Today's Date: 02/07/2024  END OF SESSION:   Past Medical History:  Diagnosis Date   Anemia    Anxiety disorder    Basal cell carcinoma (BCC) of left upper arm 12/2015   Basal cell carcinoma (BCC) of right lower leg 04/2016   Borderline hypertension    CAD (coronary artery disease)    a. nonobst by cor CT 12/2018.   CIN II (cervical intraepithelial neoplasia II)    CIN  1   Depression    Eczema    Family history of malignant neoplasm of gastrointestinal tract 08/14/2020   Family history of premature CAD    Hard of hearing    wears hearing aids   History of basal cell carcinoma (BCC) excision    left upper arm 2017;   right lower leg 2018;  bilateral lower leg and hip area 2019;   inner right lower leg, outer right thigh, & upper outside left arm 09/ 2020   History of cervical dysplasia 2013   History of hereditary spherocytosis    s/p splenectomy in 1954   History of hyperparathyroidism    per pt yrs ago was put on mega dose of vit d which caused the hyperparathyroid resolved after stopping vit d   History of pelvic fracture 2013   Ischemic stroke (HCC) 08/07/2023   Left inguinal hernia 01/02/2023   MAI (mycobacterium avium-intracellulare) (HCC)    ? possibly by CT 12/2018   Mild hyperlipidemia    Osteoporosis    PONV (postoperative nausea and vomiting)    S/P hernia repair 03/02/2023   S/P splenectomy    spherocytosis   Wears glasses    White coat syndrome without diagnosis of hypertension    Past Surgical History:  Procedure Laterality Date   ANKLE SURGERY  03/14/2015   BREAST BIOPSY Left 08/30/2021   CYSTIC APOCRINE METAPLASIA   CERVICAL CONIZATION W/BX N/A 12/13/2018   Procedure: CONIZATION CERVIX WITH BIOPSY;  Surgeon: Cleotilde Ronal RAMAN, MD;  Location: [facility] OR;  Service: Gynecology;  Laterality: N/A;   CYSTOSCOPY N/A 10/18/2019    Procedure: CYSTOSCOPY;  Surgeon: Cleotilde Ronal RAMAN, MD;  Location: [facility];  Service: Gynecology;  Laterality: N/A;   IR RADIOLOGY PERIPHERAL GUIDED IV START  01/05/2019   IR US  GUIDE VASC ACCESS RIGHT  01/05/2019   KNEE SURGERY Left 1991   per pt retained hardward   LEEP N/A 12/13/2018   Procedure: possible LOOP ELECTROSURGICAL EXCISION PROCEDURE (LEEP);  Surgeon: Cleotilde Ronal RAMAN, MD;  Location: [facility];  Service: Gynecology;  Laterality: N/A;   ORIF ANKLE FRACTURE Left 03/14/2015   per pt retained hardware   ORIF PATELLA Right 10/19/2023   Procedure: OPEN REDUCTION INTERNAL FIXATION (ORIF) PATELLA;  Surgeon: Kay Kemps, MD;  Location: [facility];  Service: Orthopedics;  Laterality: Right;   ORIF WRIST FRACTURE Left 03/10/2022   Procedure: OPEN REDUCTION INTERNAL FIXATION (ORIF) WRIST FRACTURE;  Surgeon: Shari Easter, MD;  Location: [facility] OR;  Service: Orthopedics;  Laterality: Left;  regional with iv sedation   SPLENECTOMY, TOTAL  1954   due to spherocytosis   TOTAL LAPAROSCOPIC HYSTERECTOMY WITH SALPINGECTOMY N/A 10/18/2019   Procedure: TOTAL LAPAROSCOPIC HYSTERECTOMY WITH BILATERAL SALPINGECTOMY AND BILATERAL OOPHORECTOMY;  Surgeon: Cleotilde Ronal RAMAN, MD;  Location: [facility];  Service: Gynecology;  Laterality: N/A;  BILATERAL SALPINGECTOMY POSS OOPHORECTOMY   TUBAL LIGATION Bilateral 1995  Patient Active Problem List   Diagnosis Date Noted   B12 deficiency 11/25/2023   Internal hemorrhoids 11/25/2023   Anemia 11/25/2023   History of CVA (cerebrovascular accident) 11/10/2023   Cognitive impairment 10/29/2023   At risk for falls 10/29/2023   Right patella fracture 10/19/2023   Carotid artery disease 08/25/2023   Essential (primary) hypertension 08/13/2023   ICAO (internal carotid artery occlusion), right 08/12/2023   PSVT (paroxysmal supraventricular tachycardia) 08/11/2023   Residual cognitive deficit as late effect of cerebrovascular accident 08/09/2023    Lower back pain 01/02/2023   Vitamin D  deficiency 11/24/2022   Aortic atherosclerosis 11/04/2019   HLD (hyperlipidemia) 11/03/2019   CAD (coronary artery disease) 11/03/2019   S/P total hysterectomy and bilateral salpingo-oophorectomy, 2021 11/03/2019   Hearing loss 10/13/2017   History of basal cell carcinoma (BCC) 05/19/2017   Anxiety 10/10/2016   Eczema 10/10/2016   H/O splenectomy for hemolytic spherocytosis 10/10/2016   H/O basal cell carcinoma excision 10/10/2016    PCP: Glade Hope MD  REFERRING PROVIDER: Lorilee Sven SQUIBB, MD   REFERRING DIAG:  M81.0 (ICD-10-CM) - Osteoporosis, unspecified osteoporosis type, unspecified pathological fracture presence  I63.9 (ICD-10-CM) - Cerebrovascular accident (CVA), unspecified mechanism (HCC)  I63.511 (ICD-10-CM) - Right middle cerebral artery stroke (HCC)    Rationale for Evaluation and Treatment: Rehabilitation  THERAPY DIAG:  No diagnosis found.  ONSET DATE: ***  SUBJECTIVE:                                                                                                                                                                                           SUBJECTIVE STATEMENT: Done with rehab for my knee.  Want to increase my core strength and cardio. I walk around the block and ride stationary bike  a few times a week for about 15 mins. No pain  PERTINENT HISTORY:  Right patella fx with ORIF 8/25 L wrist fx with ORIF 1/24 L ankle fx 2017  PAIN:  Are you having pain? no  PRECAUTIONS: Fall  RED FLAGS: None   WEIGHT BEARING RESTRICTIONS: No  FALLS:  Has patient fallen in last 6 months? No  OCCUPATION: retired  PLOF: Independent  PATIENT GOALS: increase core strength, improve bone health  NEXT MD VISIT: as needed  OBJECTIVE:  Note: Objective measures were completed at Evaluation unless otherwise noted.  DIAGNOSTIC FINDINGS:  10/25 MRI lumbar spine (without) demonstrating: - Rotoscoliosis convex  right centered at L2.  Mild spondylosis and disc bulging as above.   - No spinal stenosis or foraminal narrowing  MRI thoracic spine (without) demonstrating: - Mild scoliosis, degenerative spondylosis and disc bulging as above.  No spinal stenosis or foraminal narrowing.  PATIENT SURVEYS:  ABC  COGNITION: Overall cognitive status: Within functional limits for tasks assessed     SENSATION: {sensation:27233}   POSTURE: Rotoscoliosis convex right centered at L2    LUMBAR ROM:   WFL  LOWER EXTREMITY ROM:     WFL  LOWER EXTREMITY MMT:    MMT Right eval Left eval  Hip flexion 27.6 27.8  Hip extension    Hip abduction 19.5 23.2  Hip adduction    Hip internal rotation    Hip external rotation    Knee flexion    Knee extension 39.4 47.6  Ankle dorsiflexion    Ankle plantarflexion    Ankle inversion    Ankle eversion     (Blank rows = not tested)   FUNCTIONAL TESTS:  3. able to stand independently using hands  {berg stand unsupported:32781}  {berg sit unsupported:32782}   {berg stand to sit:32783}  {berg transfers:32784}  {berg stand unsupported EC:32785}  {berg stand unsupported feet together:32786}  {berg reaching:32787}    {berg object from floor:32788}  {bern turn to look:32789}    {berg turn 360:32790}  {berg alternating foot:32791}    {berg tandem:32792}   {berg SLS:32793}   5 x STS: 26.02 uncontrolled sit TUG: 10.87   GAIT: Distance walked: 500 ft Assistive device utilized: {Assistive devices:23999} Level of assistance: {Levels of assistance:24026} Comments: small BOS  TREATMENT  Eval Self care:Posture and optometrist instruction                                                                                                                                  PATIENT EDUCATION:  Education details: Discussed eval findings, rehab rationale, aquatic program progression/POC and pools in area. Patient is in agreement  Person educated:  Patient Education method: Explanation Education comprehension: verbalized understanding  HOME EXERCISE PROGRAM: ***  ASSESSMENT:  CLINICAL IMPRESSION: Patient is a 73 y.o. f who was seen today for physical therapy evaluation and treatment for ***.   OBJECTIVE IMPAIRMENTS: {opptimpairments:25111}.   ACTIVITY LIMITATIONS: {activitylimitations:27494}  PARTICIPATION LIMITATIONS: {participationrestrictions:25113}  PERSONAL FACTORS: {Personal factors:25162} are also affecting patient's functional outcome.   REHAB POTENTIAL: {rehabpotential:25112}  CLINICAL DECISION MAKING: {clinical decision making:25114}  EVALUATION COMPLEXITY: {Evaluation complexity:25115}   GOALS: Goals reviewed with patient? {yes/no:20286}  SHORT TERM GOALS: Target date: ***  *** Baseline: Goal status: INITIAL  2.  *** Baseline:  Goal status: INITIAL  3.  *** Baseline:  Goal status: INITIAL  4.  *** Baseline:  Goal status: INITIAL  5.  *** Baseline:  Goal status: INITIAL  6.  *** Baseline:  Goal status: INITIAL  LONG TERM GOALS: Target date: ***  *** Baseline:  Goal status: INITIAL  2.  *** Baseline:  Goal status: INITIAL  3.  *** Baseline:  Goal status: INITIAL  4.  *** Baseline:  Goal status: INITIAL  5.  *** Baseline:  Goal status: INITIAL  6.  ***  Baseline:  Goal status: INITIAL  PLAN:  PT FREQUENCY: {rehab frequency:25116}  PT DURATION: {rehab duration:25117}  PLANNED INTERVENTIONS: 97164- PT Re-evaluation, 97750- Physical Performance Testing, 97110-Therapeutic exercises, 97530- Therapeutic activity, [id number]- Neuromuscular re-education, 97535- Self Care, 02859- Manual therapy, [id number]- Gait training, [phone]- Orthotic Initial, [phone]- Aquatic Therapy, [phone]- Electrical stimulation (unattended), [phone]- Electrical stimulation (manual), [id number]- Ionotophoresis 4mg /ml Dexamethasone , 79439 (1-2 muscles), 20561 (3+ muscles)- Dry Needling, Patient/Family education, Balance  training, Stair training, Taping, Joint mobilization, DME instructions, Cryotherapy, and Moist heat.  PLAN FOR NEXT SESSION: PIERRETTE Shuck [place]) Gregoire Bennis MPT 02/07/2024 6:44 PM [facility] [address] [place], [place], [address] Phone: [phone]   Fax:  [phone]

## 2024-02-08 ENCOUNTER — Ambulatory Visit (HOSPITAL_BASED_OUTPATIENT_CLINIC_OR_DEPARTMENT_OTHER): Attending: Physical Medicine and Rehabilitation | Admitting: Physical Therapy

## 2024-02-08 ENCOUNTER — Other Ambulatory Visit (HOSPITAL_BASED_OUTPATIENT_CLINIC_OR_DEPARTMENT_OTHER): Payer: Self-pay

## 2024-02-08 DIAGNOSIS — M810 Age-related osteoporosis without current pathological fracture: Secondary | ICD-10-CM | POA: Insufficient documentation

## 2024-02-08 DIAGNOSIS — R2689 Other abnormalities of gait and mobility: Secondary | ICD-10-CM | POA: Diagnosis present

## 2024-02-08 DIAGNOSIS — M6281 Muscle weakness (generalized): Secondary | ICD-10-CM

## 2024-02-08 DIAGNOSIS — Z9189 Other specified personal risk factors, not elsewhere classified: Secondary | ICD-10-CM

## 2024-02-08 NOTE — Therapy (Unsigned)
 OUTPATIENT PHYSICAL THERAPY THORACOLUMBAR EVALUATION   Patient Name: Alicia Wilkerson MRN: [id number] DOB:30-Sep-1950, 73 y.o., female Today's Date: 02/07/2024  END OF SESSION:   Past Medical History:  Diagnosis Date   Anemia    Anxiety disorder    Basal cell carcinoma (BCC) of left upper arm 12/2015   Basal cell carcinoma (BCC) of right lower leg 04/2016   Borderline hypertension    CAD (coronary artery disease)    a. nonobst by cor CT 12/2018.   CIN II (cervical intraepithelial neoplasia II)    CIN  1   Depression    Eczema    Family history of malignant neoplasm of gastrointestinal tract 08/14/2020   Family history of premature CAD    Hard of hearing    wears hearing aids   History of basal cell carcinoma (BCC) excision    left upper arm 2017;   right lower leg 2018;  bilateral lower leg and hip area 2019;   inner right lower leg, outer right thigh, & upper outside left arm 09/ 2020   History of cervical dysplasia 2013   History of hereditary spherocytosis    s/p splenectomy in 1954   History of hyperparathyroidism    per pt yrs ago was put on mega dose of vit d which caused the hyperparathyroid resolved after stopping vit d   History of pelvic fracture 2013   Ischemic stroke (HCC) 08/07/2023   Left inguinal hernia 01/02/2023   MAI (mycobacterium avium-intracellulare) (HCC)    ? possibly by CT 12/2018   Mild hyperlipidemia    Osteoporosis    PONV (postoperative nausea and vomiting)    S/P hernia repair 03/02/2023   S/P splenectomy    spherocytosis   Wears glasses    White coat syndrome without diagnosis of hypertension    Past Surgical History:  Procedure Laterality Date   ANKLE SURGERY  03/14/2015   BREAST BIOPSY Left 08/30/2021   CYSTIC APOCRINE METAPLASIA   CERVICAL CONIZATION W/BX N/A 12/13/2018   Procedure: CONIZATION CERVIX WITH BIOPSY;  Surgeon: Cleotilde Ronal RAMAN, MD;  Location: [facility] OR;  Service: Gynecology;  Laterality: N/A;   CYSTOSCOPY N/A 10/18/2019    Procedure: CYSTOSCOPY;  Surgeon: Cleotilde Ronal RAMAN, MD;  Location: [facility];  Service: Gynecology;  Laterality: N/A;   IR RADIOLOGY PERIPHERAL GUIDED IV START  01/05/2019   IR US  GUIDE VASC ACCESS RIGHT  01/05/2019   KNEE SURGERY Left 1991   per pt retained hardward   LEEP N/A 12/13/2018   Procedure: possible LOOP ELECTROSURGICAL EXCISION PROCEDURE (LEEP);  Surgeon: Cleotilde Ronal RAMAN, MD;  Location: [facility] OR;  Service: Gynecology;  Laterality: N/A;   ORIF ANKLE FRACTURE Left 03/14/2015   per pt retained hardware   ORIF PATELLA Right 10/19/2023   Procedure: OPEN REDUCTION INTERNAL FIXATION (ORIF) PATELLA;  Surgeon: Kay Kemps, MD;  Location: [facility];  Service: Orthopedics;  Laterality: Right;   ORIF WRIST FRACTURE Left 03/10/2022   Procedure: OPEN REDUCTION INTERNAL FIXATION (ORIF) WRIST FRACTURE;  Surgeon: Shari Easter, MD;  Location: [facility] OR;  Service: Orthopedics;  Laterality: Left;  regional with iv sedation   SPLENECTOMY, TOTAL  1954   due to spherocytosis   TOTAL LAPAROSCOPIC HYSTERECTOMY WITH SALPINGECTOMY N/A 10/18/2019   Procedure: TOTAL LAPAROSCOPIC HYSTERECTOMY WITH BILATERAL SALPINGECTOMY AND BILATERAL OOPHORECTOMY;  Surgeon: Cleotilde Ronal RAMAN, MD;  Location: [facility];  Service: Gynecology;  Laterality: N/A;  BILATERAL SALPINGECTOMY POSS OOPHORECTOMY   TUBAL LIGATION Bilateral 1995   Patient  Active Problem List   Diagnosis Date Noted   B12 deficiency 11/25/2023   Internal hemorrhoids 11/25/2023   Anemia 11/25/2023   History of CVA (cerebrovascular accident) 11/10/2023   Cognitive impairment 10/29/2023   At risk for falls 10/29/2023   Right patella fracture 10/19/2023   Carotid artery disease 08/25/2023   Essential (primary) hypertension 08/13/2023   ICAO (internal carotid artery occlusion), right 08/12/2023   PSVT (paroxysmal supraventricular tachycardia) 08/11/2023   Residual cognitive deficit as late effect of cerebrovascular accident 08/09/2023    Lower back pain 01/02/2023   Vitamin D  deficiency 11/24/2022   Aortic atherosclerosis 11/04/2019   HLD (hyperlipidemia) 11/03/2019   CAD (coronary artery disease) 11/03/2019   S/P total hysterectomy and bilateral salpingo-oophorectomy, 2021 11/03/2019   Hearing loss 10/13/2017   History of basal cell carcinoma (BCC) 05/19/2017   Anxiety 10/10/2016   Eczema 10/10/2016   H/O splenectomy for hemolytic spherocytosis 10/10/2016   H/O basal cell carcinoma excision 10/10/2016    PCP: Glade Hope MD  REFERRING PROVIDER: Lorilee Sven SQUIBB, MD   REFERRING DIAG:  M81.0 (ICD-10-CM) - Osteoporosis, unspecified osteoporosis type, unspecified pathological fracture presence  I63.9 (ICD-10-CM) - Cerebrovascular accident (CVA), unspecified mechanism (HCC)  I63.511 (ICD-10-CM) - Right middle cerebral artery stroke (HCC)    Rationale for Evaluation and Treatment: Rehabilitation  THERAPY DIAG:  No diagnosis found.  ONSET DATE: ***  SUBJECTIVE:                                                                                                                                                                                           SUBJECTIVE STATEMENT: Done with rehab for my knee.  Want to increase my core strength and cardio. I walk around the block and ride stationary bike  a few times a week for about 15 mins. No pain  PERTINENT HISTORY:  Right patella fx with ORIF 8/25 L wrist fx with ORIF 1/24 L ankle fx 2017  PAIN:  Are you having pain? no  PRECAUTIONS: Fall  RED FLAGS: None   WEIGHT BEARING RESTRICTIONS: No  FALLS:  Has patient fallen in last 6 months? No  OCCUPATION: retired  PLOF: Independent  PATIENT GOALS: increase core strength, improve bone health  NEXT MD VISIT: as needed  OBJECTIVE:  Note: Objective measures were completed at Evaluation unless otherwise noted.  DIAGNOSTIC FINDINGS:  10/25 MRI lumbar spine (without) demonstrating: - Rotoscoliosis convex right  centered at L2.  Mild spondylosis and disc bulging as above.   - No spinal stenosis or foraminal narrowing  MRI thoracic spine (without) demonstrating: - Mild scoliosis, degenerative spondylosis and disc bulging as above.  No spinal stenosis or foraminal narrowing.  PATIENT SURVEYS:  ABC:123/16=76% confident  COGNITION: Overall cognitive status: Within functional limits for tasks assessed       POSTURE: Rotoscoliosis convex right centered at L2    LUMBAR ROM:   WFL  LOWER EXTREMITY ROM:     WFL  LOWER EXTREMITY MMT:    MMT Right eval Left eval  Hip flexion 27.6 27.8  Hip extension    Hip abduction 19.5 23.2  Hip adduction    Hip internal rotation    Hip external rotation    Knee flexion    Knee extension 39.4 47.6  Ankle dorsiflexion    Ankle plantarflexion    Ankle inversion    Ankle eversion     (Blank rows = not tested)   FUNCTIONAL TESTS:  3. able to stand independently using hands  4. able to stand safely for 2 minutes  4. able to sit safely and securely for 2 minutes   3. controls descent by using hands  3. able to transfer safely with definite need of hands  4. able to stand 10 seconds safely  4. able to place feet together independently and stand 1 minute safely  4. can reach forward confidently 25 cm (10 inches)    4. able to pick up slipper safely and easily  4. looks behind from both sides and weight shifts well    2. able to turn 360 degrees safely but slowly  3. able to stand independently and complete 8 steps in > 20 seconds     1. needs help to step but can hold 15 seconds   1. tries to lift leg unable to hold 3 seconds but remains standing independently.   44/56   5 x STS: 26.02 uncontrolled sit TUG: 10.87   GAIT: Distance walked: 500 ft Assistive device utilized: None Level of assistance: Complete Independence Comments: small BOS  TREATMENT  Eval Self care:Posture and optometrist instruction                                                                                                                                   PATIENT EDUCATION:  Education details: Discussed eval findings, rehab rationale, aquatic program progression/POC and pools in area. Patient is in agreement  Person educated: Patient Education method: Explanation Education comprehension: verbalized understanding  HOME EXERCISE PROGRAM: Pt following HEP assigned by land therapy at other clinic (recently) DC for rehab patella fx Aquatic tba  ASSESSMENT:  CLINICAL IMPRESSION: Patient is a 73 y.o. f who was seen today for physical therapy evaluation and treatment for OP with past related pathological fx. She has just completed an episode of PT at another clinic s/p Right patella fx with ORIF 8/25. Pt reports at baseline she is not very active but is trying to increase her activity at MD suggestion to improve bone health as since 2017 she has had 3 pathological fxs.  She has not had a recent bone density test completed.  Testing today demonstrates reduced strength in core and hips.  Requires UE assist to rise from chairs.    OBJECTIVE IMPAIRMENTS: decreased activity tolerance and lack of motivation.   ACTIVITY LIMITATIONS: locomotion level   PERSONAL FACTORS: Behavior pattern are also affecting patient's functional outcome.   REHAB POTENTIAL: Good  CLINICAL DECISION MAKING: Stable/uncomplicated  EVALUATION COMPLEXITY: Low   GOALS: Goals reviewed with patient? Yes  SHORT TERM GOALS: Target date: ***  Pt will tolerate full aquatic sessions consistently without increase in pain and with improving function to demonstrate good toleration and effectiveness of intervention.  Baseline: Goal status: INITIAL  2.  Pt to report compliance with Baseline:  Goal status: INITIAL  3.  *** Baseline:  Goal status: INITIAL  4.  *** Baseline:  Goal status: INITIAL  5.  *** Baseline:  Goal status: INITIAL  6.  *** Baseline:  Goal  status: INITIAL  LONG TERM GOALS: Target date: ***  *** Baseline:  Goal status: INITIAL  2.  *** Baseline:  Goal status: INITIAL  3.  *** Baseline:  Goal status: INITIAL  4.  *** Baseline:  Goal status: INITIAL  5.  *** Baseline:  Goal status: INITIAL  6.  *** Baseline:  Goal status: INITIAL  PLAN:  PT FREQUENCY: {rehab frequency:25116}  PT DURATION: {rehab duration:25117}  PLANNED INTERVENTIONS: 97164- PT Re-evaluation, 97750- Physical Performance Testing, 97110-Therapeutic exercises, 97530- Therapeutic activity, 97112- Neuromuscular re-education, 97535- Self Care, 02859- Manual therapy, [id number]- Gait training, [id number]- Orthotic Initial, [id number]- Aquatic Therapy, [email or web address]- Electrical stimulation (unattended), [id number]- Electrical stimulation (manual), [id number]- Ionotophoresis 4mg /ml Dexamethasone , 79439 (1-2 muscles), 20561 (3+ muscles)- Dry Needling, Patient/Family education, Balance training, Stair training, Taping, Joint mobilization, DME instructions, Cryotherapy, and Moist heat.  PLAN FOR NEXT SESSION: PIERRETTE Shuck [place]) Munira Polson MPT 02/07/2024 6:44 PM [facility] [address] [place], [place], [address] Phone: [phone]   Fax:  [phone]

## 2024-02-09 ENCOUNTER — Other Ambulatory Visit: Payer: Self-pay

## 2024-02-09 ENCOUNTER — Encounter (HOSPITAL_BASED_OUTPATIENT_CLINIC_OR_DEPARTMENT_OTHER): Payer: Self-pay | Admitting: Physical Therapy

## 2024-02-15 ENCOUNTER — Other Ambulatory Visit (HOSPITAL_BASED_OUTPATIENT_CLINIC_OR_DEPARTMENT_OTHER): Payer: Self-pay

## 2024-02-15 ENCOUNTER — Ambulatory Visit (HOSPITAL_BASED_OUTPATIENT_CLINIC_OR_DEPARTMENT_OTHER): Admitting: Physical Therapy

## 2024-02-15 ENCOUNTER — Encounter (HOSPITAL_BASED_OUTPATIENT_CLINIC_OR_DEPARTMENT_OTHER): Payer: Self-pay | Admitting: Physical Therapy

## 2024-02-15 DIAGNOSIS — M810 Age-related osteoporosis without current pathological fracture: Secondary | ICD-10-CM

## 2024-02-15 DIAGNOSIS — M6281 Muscle weakness (generalized): Secondary | ICD-10-CM

## 2024-02-15 DIAGNOSIS — R2689 Other abnormalities of gait and mobility: Secondary | ICD-10-CM

## 2024-02-15 NOTE — Therapy (Signed)
 " OUTPATIENT PHYSICAL THERAPY THORACOLUMBAR EVALUATION   Patient Name: Alicia Wilkerson MRN: [id number] DOB:Jul 25, 1950, 74 y.o., female Today's Date: 02/07/2024  END OF SESSION:   PT End of Session - 02/15/24 1704     Visit Number 2    Date for Recertification  03/25/24    Authorization Type aetna medicare    Progress Note Due on Visit 10    PT Start Time 1700    PT Stop Time 1740    PT Time Calculation (min) 40 min    Activity Tolerance Patient tolerated treatment well    Behavior During Therapy [facility] for tasks assessed/performed             Past Medical History:  Diagnosis Date   Anemia    Anxiety disorder    Basal cell carcinoma (BCC) of left upper arm 12/2015   Basal cell carcinoma (BCC) of right lower leg 04/2016   Borderline hypertension    CAD (coronary artery disease)    a. nonobst by cor CT 12/2018.   CIN II (cervical intraepithelial neoplasia II)    CIN  1   Depression    Eczema    Family history of malignant neoplasm of gastrointestinal tract 08/14/2020   Family history of premature CAD    Hard of hearing    wears hearing aids   History of basal cell carcinoma (BCC) excision    left upper arm 2017;   right lower leg 2018;  bilateral lower leg and hip area 2019;   inner right lower leg, outer right thigh, & upper outside left arm 09/ 2020   History of cervical dysplasia 2013   History of hereditary spherocytosis    s/p splenectomy in 1954   History of hyperparathyroidism    per pt yrs ago was put on mega dose of vit d which caused the hyperparathyroid resolved after stopping vit d   History of pelvic fracture 2013   Ischemic stroke (HCC) 08/07/2023   Left inguinal hernia 01/02/2023   MAI (mycobacterium avium-intracellulare) (HCC)    ? possibly by CT 12/2018   Mild hyperlipidemia    Osteoporosis    PONV (postoperative nausea and vomiting)    S/P hernia repair 03/02/2023   S/P splenectomy    spherocytosis   Wears glasses    White coat syndrome  without diagnosis of hypertension    Past Surgical History:  Procedure Laterality Date   ANKLE SURGERY  03/14/2015   BREAST BIOPSY Left 08/30/2021   CYSTIC APOCRINE METAPLASIA   CERVICAL CONIZATION W/BX N/A 12/13/2018   Procedure: CONIZATION CERVIX WITH BIOPSY;  Surgeon: Cleotilde Ronal RAMAN, MD;  Location: [facility] OR;  Service: Gynecology;  Laterality: N/A;   CYSTOSCOPY N/A 10/18/2019   Procedure: CYSTOSCOPY;  Surgeon: Cleotilde Ronal RAMAN, MD;  Location: [facility];  Service: Gynecology;  Laterality: N/A;   IR RADIOLOGY PERIPHERAL GUIDED IV START  01/05/2019   IR US  GUIDE VASC ACCESS RIGHT  01/05/2019   KNEE SURGERY Left 1991   per pt retained hardward   LEEP N/A 12/13/2018   Procedure: possible LOOP ELECTROSURGICAL EXCISION PROCEDURE (LEEP);  Surgeon: Cleotilde Ronal RAMAN, MD;  Location: [facility] OR;  Service: Gynecology;  Laterality: N/A;   ORIF ANKLE FRACTURE Left 03/14/2015   per pt retained hardware   ORIF PATELLA Right 10/19/2023   Procedure: OPEN REDUCTION INTERNAL FIXATION (ORIF) PATELLA;  Surgeon: Kay Kemps, MD;  Location: [facility] ORS;  Service: Orthopedics;  Laterality: Right;   ORIF WRIST FRACTURE Left 03/10/2022  Procedure: OPEN REDUCTION INTERNAL FIXATION (ORIF) WRIST FRACTURE;  Surgeon: Shari Easter, MD;  Location: [facility] OR;  Service: Orthopedics;  Laterality: Left;  regional with iv sedation   SPLENECTOMY, TOTAL  1954   due to spherocytosis   TOTAL LAPAROSCOPIC HYSTERECTOMY WITH SALPINGECTOMY N/A 10/18/2019   Procedure: TOTAL LAPAROSCOPIC HYSTERECTOMY WITH BILATERAL SALPINGECTOMY AND BILATERAL OOPHORECTOMY;  Surgeon: Cleotilde Ronal RAMAN, MD;  Location: [facility] [place];  Service: Gynecology;  Laterality: N/A;  BILATERAL SALPINGECTOMY POSS OOPHORECTOMY   TUBAL LIGATION Bilateral 1995   Patient Active Problem List   Diagnosis Date Noted   B12 deficiency 11/25/2023   Internal hemorrhoids 11/25/2023   Anemia 11/25/2023   History of CVA (cerebrovascular accident) 11/10/2023    Cognitive impairment 10/29/2023   At risk for falls 10/29/2023   Right patella fracture 10/19/2023   Carotid artery disease 08/25/2023   Essential (primary) hypertension 08/13/2023   ICAO (internal carotid artery occlusion), right 08/12/2023   PSVT (paroxysmal supraventricular tachycardia) 08/11/2023   Residual cognitive deficit as late effect of cerebrovascular accident 08/09/2023   Lower back pain 01/02/2023   Vitamin D  deficiency 11/24/2022   Aortic atherosclerosis 11/04/2019   HLD (hyperlipidemia) 11/03/2019   CAD (coronary artery disease) 11/03/2019   S/P total hysterectomy and bilateral salpingo-oophorectomy, 2021 11/03/2019   Hearing loss 10/13/2017   History of basal cell carcinoma (BCC) 05/19/2017   Anxiety 10/10/2016   Eczema 10/10/2016   H/O splenectomy for hemolytic spherocytosis 10/10/2016   H/O basal cell carcinoma excision 10/10/2016    PCP: Glade Hope MD  REFERRING PROVIDER: Lorilee Sven SQUIBB, MD   REFERRING DIAG:  M81.0 (ICD-10-CM) - Osteoporosis, unspecified osteoporosis type, unspecified pathological fracture presence  I63.9 (ICD-10-CM) - Cerebrovascular accident (CVA), unspecified mechanism (HCC)  I63.511 (ICD-10-CM) - Right middle cerebral artery stroke (HCC)    Rationale for Evaluation and Treatment: Rehabilitation  THERAPY DIAG:  Muscle weakness Osteoporosis without current pathological fx Other abnormalities of gait and mobility  ONSET DATE: chronic  SUBJECTIVE:                                                                                                                                                                                           SUBJECTIVE STATEMENT: No changes.   Initial Subjective Done with rehab for my knee.  Want to increase my core strength and cardio. I walk around the block and ride stationary bike  a few times a week for about 15 mins. No pain.  PERTINENT HISTORY:  Right patella fx with ORIF 8/25 L wrist fx with  ORIF 1/24 L ankle fx 2017  PAIN:  Are you having pain? no  PRECAUTIONS: Fall  RED FLAGS: None   WEIGHT BEARING RESTRICTIONS: No  FALLS:  Has patient fallen in last 6 months? No  OCCUPATION: retired  PLOF: Independent  PATIENT GOALS: increase core strength, improve bone health  NEXT MD VISIT: as needed  OBJECTIVE:  Note: Objective measures were completed at Evaluation unless otherwise noted.  DIAGNOSTIC FINDINGS:  10/25 MRI lumbar spine (without) demonstrating: - Rotoscoliosis convex right centered at L2.  Mild spondylosis and disc bulging as above.   - No spinal stenosis or foraminal narrowing  MRI thoracic spine (without) demonstrating: - Mild scoliosis, degenerative spondylosis and disc bulging as above.  No spinal stenosis or foraminal narrowing.  PATIENT SURVEYS:  ABC:123/16=76% confident  COGNITION: Overall cognitive status: Within functional limits for tasks assessed       POSTURE: Rotoscoliosis convex right centered at L2    LUMBAR ROM:   WFL  LOWER EXTREMITY ROM:     WFL  LOWER EXTREMITY MMT:    MMT Right eval Left eval  Hip flexion 27.6 27.8  Hip extension    Hip abduction 19.5 23.2  Hip adduction    Hip internal rotation    Hip external rotation    Knee flexion    Knee extension 39.4 47.6  Ankle dorsiflexion    Ankle plantarflexion    Ankle inversion    Ankle eversion     (Blank rows = not tested)   FUNCTIONAL TESTS:  Berg: 44/56 5 x STS: 26.02 uncontrolled sit TUG: 10.87   GAIT: Distance walked: 500 ft Assistive device utilized: None Level of assistance: Complete Independence Comments: small BOS  TREATMENT  [facility] Adult PT Treatment:                                                DATE: 02/15/24 Pt seen for aquatic therapy today.  Treatment took place in water 3.5-4.75 ft in depth at the [facility] pool. Temp of water was 91.  Pt entered/exited the pool via stairs using alternating step pattern with hand  rail.  *Intro to setting *walking forward, back and side stepping in 3.6-4.0 ft with unsupported *Side stepping-> ue add/abd RBHB *suitcase carry using RBHB bilaterally then unilaterally marching forward and backward *1/2 noodle pull down for TrA engagement wide stance than staggered *Ue support on wall: toe raises; heel raises; hip add/abd; hip extension; relaxed squats; hip hiking x 10-12 *Step ups bottom step leading R/L ue supported->unsupported.  Cues for r quad control *decompression position with noodle wrapped  posteriorly across chest: cycling  Pt requires the buoyancy and hydrostatic pressure of water for support, and to offload joints by unweighting joint load by at least 50 % in navel deep water and by at least 75-80% in chest to neck deep water.  Viscosity of the water is needed for resistance of strengthening. Water current perturbations provides challenge to standing balance requiring increased core activation.  PATIENT EDUCATION:  Education details: Discussed eval findings, rehab rationale, aquatic program progression/POC and pools in area. Patient is in agreement  Person educated: Patient Education method: Explanation Education comprehension: verbalized understanding  HOME EXERCISE PROGRAM: Pt following HEP assigned by land therapy at other clinic (recently) DC for rehab patella fx Aquatic tba  ASSESSMENT:  CLINICAL IMPRESSION: Pt demonstrates safety and independence in aquatic setting with therapist instructing from deck. She is confident in setting, moving throughout all depths easily.  Pt is directed through various movement patterns and trials in both sitting and standing positions.   Pt is provided VC and demonstration throughout session for execution of exercises while monitoring toleration. She tends to move with quick motions  which she needs multiple vc to slow.  Has some difficulty with lle hip and knee strength and timing with step ups which does improve with cuing and repetition. She is a good candidate for aquatic intervention and will benefit from the properties of water to progress towards functional goals.     Initial Impression Patient is a 73 y.o. f who was seen today for physical therapy evaluation and treatment for OP with past related pathological fx. She has just completed an episode of PT at another clinic s/p Right patella fx with ORIF 8/25. Pt reports at baseline she is not very active but is trying to increase her activity at MD suggestion to improve bone health as since 2017 she has had 3 pathological fxs.  She has not had a recent bone density test completed.  Testing today demonstrates reduced strength in core and hips.  Requires UE assist to rise from chairs.  She is doing some low level exercise a few times a week but as she reports not getting her heart rate up. She will benefit from skilled PT to improve all areas and deficit and instruct on HEP to improve bone health. Plan to see her in aquatics a few times for instruction on water exercise although majority of sessions to be land based for loading to progress bone health.  OBJECTIVE IMPAIRMENTS: decreased activity tolerance and lack of motivation.   ACTIVITY LIMITATIONS: locomotion level  PERSONAL FACTORS: Behavior pattern are also affecting patient's functional outcome.   REHAB POTENTIAL: Good  CLINICAL DECISION MAKING: Stable/uncomplicated  EVALUATION COMPLEXITY: Low   GOALS: Goals reviewed with patient? Yes  SHORT TERM GOALS: Target date: 03/05/24  Pt will tolerate full aquatic sessions consistently without increase in pain and with improving function to demonstrate good toleration and effectiveness of intervention.  Baseline: Goal status: INITIAL  2.  Pt to report compliance with daily exercise of walking or using station  bike/elliptical (at home) Baseline:  Goal status: INITIAL    LONG TERM GOALS: Target date: 03/25/24  Pt will improve on her ABC score above 80% to demonstrate a High level of physical function Baseline: 123/16=76% confident Goal status: INITIAL  2.  Pt will be indep and compliant with final HEP's (land and aquatic as appropriate) for continued management of condition Baseline:  Goal status: INITIAL  3.  Pt will improve strength in hips by at least 5 lbs to demonstrate improved overall physical function Baseline:  Goal status: INITIAL  4.  Pt will improve on 5 X STS test to <or= 21s to demonstrate improving functional lower extremity strength, transitional movements, and balance. (MDC = 4.2sec)   Baseline: 26.02 with uncontrolled sit Goal status: INITIAL    PLAN:  PT FREQUENCY: 2x/week  PT DURATION: 6 weeks  PLANNED INTERVENTIONS:  02835- PT Re-evaluation, 97750- Physical Performance Testing, 97110-Therapeutic exercises, 97530- Therapeutic activity, [id number]- Neuromuscular re-education, [phone]- Self Care, 97140- Manual therapy, [phone]- Gait training, [phone]- Orthotic Initial, [phone]- Aquatic Therapy, [phone]- Electrical stimulation (unattended), [phone]- Electrical stimulation (manual), [id number]- Ionotophoresis 4mg /ml Dexamethasone , 20560 (1-2 muscles), 20561 (3+ muscles)- Dry Needling, Patient/Family education, Balance training, Stair training, Taping, Joint mobilization, DME instructions, Cryotherapy, and Moist heat.  PLAN FOR NEXT SESSION: aquatics instruct on hep for le and core strengthening; balance retraining Land: loaded as tolerated for general strengthening and to encourage improve bone health; HEP   Ronal Success) Landrey Mahurin MPT 02/07/2024 6:44 PM [facility] [address] [place], [place], [address] Phone: [phone]   Fax:  [phone] "

## 2024-02-17 ENCOUNTER — Ambulatory Visit (HOSPITAL_BASED_OUTPATIENT_CLINIC_OR_DEPARTMENT_OTHER)

## 2024-02-17 ENCOUNTER — Encounter (HOSPITAL_BASED_OUTPATIENT_CLINIC_OR_DEPARTMENT_OTHER): Payer: Self-pay

## 2024-02-17 DIAGNOSIS — M6281 Muscle weakness (generalized): Secondary | ICD-10-CM | POA: Diagnosis not present

## 2024-02-17 DIAGNOSIS — M810 Age-related osteoporosis without current pathological fracture: Secondary | ICD-10-CM

## 2024-02-17 DIAGNOSIS — R2689 Other abnormalities of gait and mobility: Secondary | ICD-10-CM

## 2024-02-17 NOTE — Therapy (Signed)
 " OUTPATIENT PHYSICAL THERAPY THORACOLUMBAR TREATMENT   Patient Name: Alicia Wilkerson MRN: [id number] DOB:02/09/1951, 73 y.o., female Today's Date: 02/07/2024  END OF SESSION:   PT End of Session - 02/17/24 1110     Visit Number 3    Date for Recertification  03/25/24    Authorization Type aetna medicare    Progress Note Due on Visit 10    PT Start Time 1103    PT Stop Time 1145    PT Time Calculation (min) 42 min    Activity Tolerance Patient tolerated treatment well    Behavior During Therapy [facility] for tasks assessed/performed              Past Medical History:  Diagnosis Date   Anemia    Anxiety disorder    Basal cell carcinoma (BCC) of left upper arm 12/2015   Basal cell carcinoma (BCC) of right lower leg 04/2016   Borderline hypertension    CAD (coronary artery disease)    a. nonobst by cor CT 12/2018.   CIN II (cervical intraepithelial neoplasia II)    CIN  1   Depression    Eczema    Family history of malignant neoplasm of gastrointestinal tract 08/14/2020   Family history of premature CAD    Hard of hearing    wears hearing aids   History of basal cell carcinoma (BCC) excision    left upper arm 2017;   right lower leg 2018;  bilateral lower leg and hip area 2019;   inner right lower leg, outer right thigh, & upper outside left arm 09/ 2020   History of cervical dysplasia 2013   History of hereditary spherocytosis    s/p splenectomy in 1954   History of hyperparathyroidism    per pt yrs ago was put on mega dose of vit d which caused the hyperparathyroid resolved after stopping vit d   History of pelvic fracture 2013   Ischemic stroke (HCC) 08/07/2023   Left inguinal hernia 01/02/2023   MAI (mycobacterium avium-intracellulare) (HCC)    ? possibly by CT 12/2018   Mild hyperlipidemia    Osteoporosis    PONV (postoperative nausea and vomiting)    S/P hernia repair 03/02/2023   S/P splenectomy    spherocytosis   Wears glasses    White coat syndrome  without diagnosis of hypertension    Past Surgical History:  Procedure Laterality Date   ANKLE SURGERY  03/14/2015   BREAST BIOPSY Left 08/30/2021   CYSTIC APOCRINE METAPLASIA   CERVICAL CONIZATION W/BX N/A 12/13/2018   Procedure: CONIZATION CERVIX WITH BIOPSY;  Surgeon: Cleotilde Ronal RAMAN, MD;  Location: [facility] OR;  Service: Gynecology;  Laterality: N/A;   CYSTOSCOPY N/A 10/18/2019   Procedure: CYSTOSCOPY;  Surgeon: Cleotilde Ronal RAMAN, MD;  Location: [facility];  Service: Gynecology;  Laterality: N/A;   IR RADIOLOGY PERIPHERAL GUIDED IV START  01/05/2019   IR US  GUIDE VASC ACCESS RIGHT  01/05/2019   KNEE SURGERY Left 1991   per pt retained hardward   LEEP N/A 12/13/2018   Procedure: possible LOOP ELECTROSURGICAL EXCISION PROCEDURE (LEEP);  Surgeon: Cleotilde Ronal RAMAN, MD;  Location: [facility] OR;  Service: Gynecology;  Laterality: N/A;   ORIF ANKLE FRACTURE Left 03/14/2015   per pt retained hardware   ORIF PATELLA Right 10/19/2023   Procedure: OPEN REDUCTION INTERNAL FIXATION (ORIF) PATELLA;  Surgeon: Kay Kemps, MD;  Location: [facility] ORS;  Service: Orthopedics;  Laterality: Right;   ORIF WRIST FRACTURE Left 03/10/2022  Procedure: OPEN REDUCTION INTERNAL FIXATION (ORIF) WRIST FRACTURE;  Surgeon: Shari Easter, MD;  Location: [facility] OR;  Service: Orthopedics;  Laterality: Left;  regional with iv sedation   SPLENECTOMY, TOTAL  1954   due to spherocytosis   TOTAL LAPAROSCOPIC HYSTERECTOMY WITH SALPINGECTOMY N/A 10/18/2019   Procedure: TOTAL LAPAROSCOPIC HYSTERECTOMY WITH BILATERAL SALPINGECTOMY AND BILATERAL OOPHORECTOMY;  Surgeon: Cleotilde Ronal RAMAN, MD;  Location: [facility];  Service: Gynecology;  Laterality: N/A;  BILATERAL SALPINGECTOMY POSS OOPHORECTOMY   TUBAL LIGATION Bilateral 1995   Patient Active Problem List   Diagnosis Date Noted   B12 deficiency 11/25/2023   Internal hemorrhoids 11/25/2023   Anemia 11/25/2023   History of CVA (cerebrovascular accident) 11/10/2023    Cognitive impairment 10/29/2023   At risk for falls 10/29/2023   Right patella fracture 10/19/2023   Carotid artery disease 08/25/2023   Essential (primary) hypertension 08/13/2023   ICAO (internal carotid artery occlusion), right 08/12/2023   PSVT (paroxysmal supraventricular tachycardia) 08/11/2023   Residual cognitive deficit as late effect of cerebrovascular accident 08/09/2023   Lower back pain 01/02/2023   Vitamin D  deficiency 11/24/2022   Aortic atherosclerosis 11/04/2019   HLD (hyperlipidemia) 11/03/2019   CAD (coronary artery disease) 11/03/2019   S/P total hysterectomy and bilateral salpingo-oophorectomy, 2021 11/03/2019   Hearing loss 10/13/2017   History of basal cell carcinoma (BCC) 05/19/2017   Anxiety 10/10/2016   Eczema 10/10/2016   H/O splenectomy for hemolytic spherocytosis 10/10/2016   H/O basal cell carcinoma excision 10/10/2016    PCP: Glade Hope MD  REFERRING PROVIDER: Lorilee Sven SQUIBB, MD   REFERRING DIAG:  M81.0 (ICD-10-CM) - Osteoporosis, unspecified osteoporosis type, unspecified pathological fracture presence  I63.9 (ICD-10-CM) - Cerebrovascular accident (CVA), unspecified mechanism (HCC)  I63.511 (ICD-10-CM) - Right middle cerebral artery stroke (HCC)    Rationale for Evaluation and Treatment: Rehabilitation  THERAPY DIAG:  Muscle weakness Osteoporosis without current pathological fx Other abnormalities of gait and mobility  ONSET DATE: chronic  SUBJECTIVE:                                                                                                                                                                                           SUBJECTIVE STATEMENT: A little sore in R side of thoracic area after pulling a muscle last night. Pool therapy went well last visit.    Initial Subjective Done with rehab for my knee.  Want to increase my core strength and cardio. I walk around the block and ride stationary bike  a few times a week for  about 15 mins. No pain.  PERTINENT HISTORY:  Right patella fx with ORIF  8/25 L wrist fx with ORIF 1/24 L ankle fx 2017  PAIN:  Are you having pain? no  PRECAUTIONS: Fall  RED FLAGS: None   WEIGHT BEARING RESTRICTIONS: No  FALLS:  Has patient fallen in last 6 months? No  OCCUPATION: retired  PLOF: Independent  PATIENT GOALS: increase core strength, improve bone health  NEXT MD VISIT: as needed  OBJECTIVE:  Note: Objective measures were completed at Evaluation unless otherwise noted.  DIAGNOSTIC FINDINGS:  10/25 MRI lumbar spine (without) demonstrating: - Rotoscoliosis convex right centered at L2.  Mild spondylosis and disc bulging as above.   - No spinal stenosis or foraminal narrowing  MRI thoracic spine (without) demonstrating: - Mild scoliosis, degenerative spondylosis and disc bulging as above.  No spinal stenosis or foraminal narrowing.  PATIENT SURVEYS:  ABC:123/16=76% confident  COGNITION: Overall cognitive status: Within functional limits for tasks assessed       POSTURE: Rotoscoliosis convex right centered at L2    LUMBAR ROM:   WFL  LOWER EXTREMITY ROM:     WFL  LOWER EXTREMITY MMT:    MMT Right eval Left eval  Hip flexion 27.6 27.8  Hip extension    Hip abduction 19.5 23.2  Hip adduction    Hip internal rotation    Hip external rotation    Knee flexion    Knee extension 39.4 47.6  Ankle dorsiflexion    Ankle plantarflexion    Ankle inversion    Ankle eversion     (Blank rows = not tested)   FUNCTIONAL TESTS:  Berg: 44/56 5 x STS: 26.02 uncontrolled sit TUG: 10.87   GAIT: Distance walked: 500 ft Assistive device utilized: None Level of assistance: Complete Independence Comments: small BOS  TREATMENT  [facility] Adult PT Treatment:                                                  DATE: 02/17/24  -Nu-step L4 x19min -Sit to stands with green band around thighs  -HR/TR 3x10 -partial squats at rail x10 -standing hip  abduction /extension 2# 2x10ea -standing marching 2# 2x10 -standing HSC 2# 2x10ea -LAQ 4# 5 hold 2x10ea -s/l hip abduction 2x10ea   DATE: 02/15/24 Pt seen for aquatic therapy today.  Treatment took place in water 3.5-4.75 ft in depth at the [facility] pool. Temp of water was 91.  Pt entered/exited the pool via stairs using alternating step pattern with hand rail.  *Intro to setting *walking forward, back and side stepping in 3.6-4.0 ft with unsupported *Side stepping-> ue add/abd RBHB *suitcase carry using RBHB bilaterally then unilaterally marching forward and backward *1/2 noodle pull down for TrA engagement wide stance than staggered *Ue support on wall: toe raises; heel raises; hip add/abd; hip extension; relaxed squats; hip hiking x 10-12 *Step ups bottom step leading R/L ue supported->unsupported.  Cues for r quad control *decompression position with noodle wrapped  posteriorly across chest: cycling  Pt requires the buoyancy and hydrostatic pressure of water for support, and to offload joints by unweighting joint load by at least 50 % in navel deep water and by at least 75-80% in chest to neck deep water.  Viscosity of the water is needed for resistance of strengthening. Water current perturbations provides challenge to standing balance requiring increased core activation.  PATIENT EDUCATION:  Education details: Discussed eval findings, rehab rationale, aquatic program progression/POC and pools in area. Patient is in agreement  Person educated: Patient Education method: Explanation Education comprehension: verbalized understanding  HOME EXERCISE PROGRAM: Pt following HEP assigned by land therapy at other clinic (recently) DC for rehab patella fx Aquatic tba  ASSESSMENT:  CLINICAL IMPRESSION: Pt demonstrates significant bilateral  genu valgum with most tasks. Added resistance band to sit to stands to counter this. With standing hip abduction she required cuing for correction of hip ER bilaterally.  Pt felt good muscular fatigue by end of session. Educated pt on DOMS and management. Will continue to progress as tolerated.    Initial Impression Patient is a 73 y.o. f who was seen today for physical therapy evaluation and treatment for OP with past related pathological fx. She has just completed an episode of PT at another clinic s/p Right patella fx with ORIF 8/25. Pt reports at baseline she is not very active but is trying to increase her activity at MD suggestion to improve bone health as since 2017 she has had 3 pathological fxs.  She has not had a recent bone density test completed.  Testing today demonstrates reduced strength in core and hips.  Requires UE assist to rise from chairs.  She is doing some low level exercise a few times a week but as she reports not getting her heart rate up. She will benefit from skilled PT to improve all areas and deficit and instruct on HEP to improve bone health. Plan to see her in aquatics a few times for instruction on water exercise although majority of sessions to be land based for loading to progress bone health.  OBJECTIVE IMPAIRMENTS: decreased activity tolerance and lack of motivation.   ACTIVITY LIMITATIONS: locomotion level  PERSONAL FACTORS: Behavior pattern are also affecting patient's functional outcome.   REHAB POTENTIAL: Good  CLINICAL DECISION MAKING: Stable/uncomplicated  EVALUATION COMPLEXITY: Low   GOALS: Goals reviewed with patient? Yes  SHORT TERM GOALS: Target date: 03/05/24  Pt will tolerate full aquatic sessions consistently without increase in pain and with improving function to demonstrate good toleration and effectiveness of intervention.  Baseline: Goal status: INITIAL  2.  Pt to report compliance with daily exercise of walking or using station  bike/elliptical (at home) Baseline:  Goal status: INITIAL    LONG TERM GOALS: Target date: 03/25/24  Pt will improve on her ABC score above 80% to demonstrate a High level of physical function Baseline: 123/16=76% confident Goal status: INITIAL  2.  Pt will be indep and compliant with final HEP's (land and aquatic as appropriate) for continued management of condition Baseline:  Goal status: INITIAL  3.  Pt will improve strength in hips by at least 5 lbs to demonstrate improved overall physical function Baseline:  Goal status: INITIAL  4.  Pt will improve on 5 X STS test to <or= 21s to demonstrate improving functional lower extremity strength, transitional movements, and balance. (MDC = 4.2sec)   Baseline: 26.02 with uncontrolled sit Goal status: INITIAL    PLAN:  PT FREQUENCY: 2x/week  PT DURATION: 6 weeks  PLANNED INTERVENTIONS: [id number]- PT Re-evaluation, 97750- Physical Performance Testing, 97110-Therapeutic exercises, 97530- Therapeutic activity, 97112- Neuromuscular re-education, 97535- Self Care, [id number]- Manual therapy, [phone]- Gait training, [phone]- Orthotic Initial, [phone]- Aquatic Therapy, [phone]- Electrical stimulation (unattended), [phone]- Electrical stimulation (manual), [id number]- Ionotophoresis 4mg /ml Dexamethasone , 79439 (1-2 muscles), [id number] (3+ muscles)- Dry Needling, Patient/Family education, Balance training, Stair training, Taping, Joint mobilization, DME  instructions, Cryotherapy, and Moist heat.  PLAN FOR NEXT SESSION: aquatics instruct on hep for le and core strengthening; balance retraining Land: loaded as tolerated for general strengthening and to encourage improve bone health; HEP   Asberry FORBES Rodes, PTA 02/17/2024, 12:01 PM  [facility] [address] [place], [place], [address] Phone: [phone]   Fax:  [phone] "

## 2024-02-22 ENCOUNTER — Ambulatory Visit (HOSPITAL_BASED_OUTPATIENT_CLINIC_OR_DEPARTMENT_OTHER): Admitting: Physical Therapy

## 2024-02-22 ENCOUNTER — Encounter (HOSPITAL_BASED_OUTPATIENT_CLINIC_OR_DEPARTMENT_OTHER): Payer: Self-pay | Admitting: Physical Therapy

## 2024-02-22 DIAGNOSIS — R2689 Other abnormalities of gait and mobility: Secondary | ICD-10-CM

## 2024-02-22 DIAGNOSIS — M6281 Muscle weakness (generalized): Secondary | ICD-10-CM

## 2024-02-22 DIAGNOSIS — M810 Age-related osteoporosis without current pathological fracture: Secondary | ICD-10-CM

## 2024-02-22 NOTE — Therapy (Signed)
 " OUTPATIENT PHYSICAL THERAPY THORACOLUMBAR TREATMENT   Patient Name: Alicia Wilkerson MRN: [id number] DOB:08-28-1950, 73 y.o., female Today's Date: 02/07/2024  END OF SESSION:   PT End of Session - 02/22/24 1618     Visit Number 4    Date for Recertification  03/25/24    Authorization Type aetna medicare    Progress Note Due on Visit 10    PT Start Time 1531    PT Stop Time 1610    PT Time Calculation (min) 39 min    Activity Tolerance Patient tolerated treatment well    Behavior During Therapy [facility] for tasks assessed/performed               Past Medical History:  Diagnosis Date   Anemia    Anxiety disorder    Basal cell carcinoma (BCC) of left upper arm 12/2015   Basal cell carcinoma (BCC) of right lower leg 04/2016   Borderline hypertension    CAD (coronary artery disease)    a. nonobst by cor CT 12/2018.   CIN II (cervical intraepithelial neoplasia II)    CIN  1   Depression    Eczema    Family history of malignant neoplasm of gastrointestinal tract 08/14/2020   Family history of premature CAD    Hard of hearing    wears hearing aids   History of basal cell carcinoma (BCC) excision    left upper arm 2017;   right lower leg 2018;  bilateral lower leg and hip area 2019;   inner right lower leg, outer right thigh, & upper outside left arm 09/ 2020   History of cervical dysplasia 2013   History of hereditary spherocytosis    s/p splenectomy in 1954   History of hyperparathyroidism    per pt yrs ago was put on mega dose of vit d which caused the hyperparathyroid resolved after stopping vit d   History of pelvic fracture 2013   Ischemic stroke (HCC) 08/07/2023   Left inguinal hernia 01/02/2023   MAI (mycobacterium avium-intracellulare) (HCC)    ? possibly by CT 12/2018   Mild hyperlipidemia    Osteoporosis    PONV (postoperative nausea and vomiting)    S/P hernia repair 03/02/2023   S/P splenectomy    spherocytosis   Wears glasses    White coat syndrome  without diagnosis of hypertension    Past Surgical History:  Procedure Laterality Date   ANKLE SURGERY  03/14/2015   BREAST BIOPSY Left 08/30/2021   CYSTIC APOCRINE METAPLASIA   CERVICAL CONIZATION W/BX N/A 12/13/2018   Procedure: CONIZATION CERVIX WITH BIOPSY;  Surgeon: Cleotilde Ronal RAMAN, MD;  Location: [facility] OR;  Service: Gynecology;  Laterality: N/A;   CYSTOSCOPY N/A 10/18/2019   Procedure: CYSTOSCOPY;  Surgeon: Cleotilde Ronal RAMAN, MD;  Location: [facility];  Service: Gynecology;  Laterality: N/A;   IR RADIOLOGY PERIPHERAL GUIDED IV START  01/05/2019   IR US  GUIDE VASC ACCESS RIGHT  01/05/2019   KNEE SURGERY Left 1991   per pt retained hardward   LEEP N/A 12/13/2018   Procedure: possible LOOP ELECTROSURGICAL EXCISION PROCEDURE (LEEP);  Surgeon: Cleotilde Ronal RAMAN, MD;  Location: [facility] OR;  Service: Gynecology;  Laterality: N/A;   ORIF ANKLE FRACTURE Left 03/14/2015   per pt retained hardware   ORIF PATELLA Right 10/19/2023   Procedure: OPEN REDUCTION INTERNAL FIXATION (ORIF) PATELLA;  Surgeon: Kay Kemps, MD;  Location: [facility];  Service: Orthopedics;  Laterality: Right;   ORIF WRIST FRACTURE Left  03/10/2022   Procedure: OPEN REDUCTION INTERNAL FIXATION (ORIF) WRIST FRACTURE;  Surgeon: Shari Easter, MD;  Location: [facility] OR;  Service: Orthopedics;  Laterality: Left;  regional with iv sedation   SPLENECTOMY, TOTAL  1954   due to spherocytosis   TOTAL LAPAROSCOPIC HYSTERECTOMY WITH SALPINGECTOMY N/A 10/18/2019   Procedure: TOTAL LAPAROSCOPIC HYSTERECTOMY WITH BILATERAL SALPINGECTOMY AND BILATERAL OOPHORECTOMY;  Surgeon: Cleotilde Ronal RAMAN, MD;  Location: [facility];  Service: Gynecology;  Laterality: N/A;  BILATERAL SALPINGECTOMY POSS OOPHORECTOMY   TUBAL LIGATION Bilateral 1995   Patient Active Problem List   Diagnosis Date Noted   B12 deficiency 11/25/2023   Internal hemorrhoids 11/25/2023   Anemia 11/25/2023   History of CVA (cerebrovascular accident) 11/10/2023    Cognitive impairment 10/29/2023   At risk for falls 10/29/2023   Right patella fracture 10/19/2023   Carotid artery disease 08/25/2023   Essential (primary) hypertension 08/13/2023   ICAO (internal carotid artery occlusion), right 08/12/2023   PSVT (paroxysmal supraventricular tachycardia) 08/11/2023   Residual cognitive deficit as late effect of cerebrovascular accident 08/09/2023   Lower back pain 01/02/2023   Vitamin D  deficiency 11/24/2022   Aortic atherosclerosis 11/04/2019   HLD (hyperlipidemia) 11/03/2019   CAD (coronary artery disease) 11/03/2019   S/P total hysterectomy and bilateral salpingo-oophorectomy, 2021 11/03/2019   Hearing loss 10/13/2017   History of basal cell carcinoma (BCC) 05/19/2017   Anxiety 10/10/2016   Eczema 10/10/2016   H/O splenectomy for hemolytic spherocytosis 10/10/2016   H/O basal cell carcinoma excision 10/10/2016    PCP: Glade Hope MD  REFERRING PROVIDER: Lorilee Sven SQUIBB, MD   REFERRING DIAG:  M81.0 (ICD-10-CM) - Osteoporosis, unspecified osteoporosis type, unspecified pathological fracture presence  I63.9 (ICD-10-CM) - Cerebrovascular accident (CVA), unspecified mechanism (HCC)  I63.511 (ICD-10-CM) - Right middle cerebral artery stroke (HCC)    Rationale for Evaluation and Treatment: Rehabilitation  THERAPY DIAG:  Muscle weakness Osteoporosis without current pathological fx Other abnormalities of gait and mobility  ONSET DATE: chronic  SUBJECTIVE:                                                                                                                                                                                           SUBJECTIVE STATEMENT: I have been on 2, 1 mile hikes and have used my stationary bike 2-3 x for about 20 minutes.  Feel like I am climbing stairs better already.   Initial Subjective Done with rehab for my knee.  Want to increase my core strength and cardio. I walk around the block and ride stationary  bike  a few times a week for about 15 mins. No  pain.  PERTINENT HISTORY:  Right patella fx with ORIF 8/25 L wrist fx with ORIF 1/24 L ankle fx 2017  PAIN:  Are you having pain? no  PRECAUTIONS: Fall  RED FLAGS: None   WEIGHT BEARING RESTRICTIONS: No  FALLS:  Has patient fallen in last 6 months? No  OCCUPATION: retired  PLOF: Independent  PATIENT GOALS: increase core strength, improve bone health  NEXT MD VISIT: as needed  OBJECTIVE:  Note: Objective measures were completed at Evaluation unless otherwise noted.  DIAGNOSTIC FINDINGS:  10/25 MRI lumbar spine (without) demonstrating: - Rotoscoliosis convex right centered at L2.  Mild spondylosis and disc bulging as above.   - No spinal stenosis or foraminal narrowing  MRI thoracic spine (without) demonstrating: - Mild scoliosis, degenerative spondylosis and disc bulging as above.  No spinal stenosis or foraminal narrowing.  PATIENT SURVEYS:  ABC:123/16=76% confident  COGNITION: Overall cognitive status: Within functional limits for tasks assessed       POSTURE: Rotoscoliosis convex right centered at L2    LUMBAR ROM:   WFL  LOWER EXTREMITY ROM:     WFL  LOWER EXTREMITY MMT:    MMT Right eval Left eval  Hip flexion 27.6 27.8  Hip extension    Hip abduction 19.5 23.2  Hip adduction    Hip internal rotation    Hip external rotation    Knee flexion    Knee extension 39.4 47.6  Ankle dorsiflexion    Ankle plantarflexion    Ankle inversion    Ankle eversion     (Blank rows = not tested)   FUNCTIONAL TESTS:  Berg: 44/56 5 x STS: 26.02 uncontrolled sit TUG: 10.87   GAIT: Distance walked: 500 ft Assistive device utilized: None Level of assistance: Complete Independence Comments: small BOS  TREATMENT  [facility] Adult PT Treatment:                                                  DATE: 02/22/24 Pt seen for aquatic therapy today.  Treatment took place in water 3.5-4.75 ft in depth at the  [facility] pool. Temp of water was 91.  Pt entered/exited the pool via stairs using alternating step pattern with hand rail.  *walking forward, back and side stepping in 3.6-4.0 ft unsupported *Side stepping-> ue add/abd RBHB-> horizontal add/abd *suitcase carry using RBHB bilaterally then unilaterally marching forward and backward *Step ups bottom step leading R/L ue supported->unsupported x 10 *stair tapping 1st step then 2nd step alternating x 10   *resisted hip abd in seated position using rider band 2 x 10 *resisted side stepping using rider band R/L *traditional squats with hip isometric abd using rider band *1/2 noodle pull down for TrA engagement wide stance than staggered *decompression position with noodle wrapped  posteriorly across chest: cycling  Pt requires the buoyancy and hydrostatic pressure of water for support, and to offload joints by unweighting joint load by at least 50 % in navel deep water and by at least 75-80% in chest to neck deep water.  Viscosity of the water is needed for resistance of strengthening. Water current perturbations provides challenge to standing balance requiring increased core activation.   DATE: 02/17/24  -Nu-step L4 x73min -Sit to stands with green band around thighs  -HR/TR 3x10 -partial squats at rail x10 -standing hip abduction /extension 2# 2x10ea -standing marching  2# 2x10 -standing HSC 2# 2x10ea -LAQ 4# 5 hold 2x10ea -s/l hip abduction 2x10ea                                                                                                                                        PATIENT EDUCATION:  Education details: Discussed eval findings, rehab rationale, aquatic program progression/POC and pools in area. Patient is in agreement  Person educated: Patient Education method: Explanation Education comprehension: verbalized understanding  HOME EXERCISE PROGRAM: Pt following HEP assigned by land therapy at other  clinic (recently) DC for rehab patella fx Aquatic tba  ASSESSMENT:  CLINICAL IMPRESSION: Pt with good response to initial land session reporting already she feels she is climbing stairs better.  Reports also completion of self directed exercise at home increasing her walking frequency /distance as well as use of stationary bike. Focus on glut and hip abd strength today with very good toleration. No pain throughout session.  She continues to have some difficulty with finding COB with cycling. Goals ongoing.      Initial Impression Patient is a 73 y.o. f who was seen today for physical therapy evaluation and treatment for OP with past related pathological fx. She has just completed an episode of PT at another clinic s/p Right patella fx with ORIF 8/25. Pt reports at baseline she is not very active but is trying to increase her activity at MD suggestion to improve bone health as since 2017 she has had 3 pathological fxs.  She has not had a recent bone density test completed.  Testing today demonstrates reduced strength in core and hips.  Requires UE assist to rise from chairs.  She is doing some low level exercise a few times a week but as she reports not getting her heart rate up. She will benefit from skilled PT to improve all areas and deficit and instruct on HEP to improve bone health. Plan to see her in aquatics a few times for instruction on water exercise although majority of sessions to be land based for loading to progress bone health.  OBJECTIVE IMPAIRMENTS: decreased activity tolerance and lack of motivation.   ACTIVITY LIMITATIONS: locomotion level  PERSONAL FACTORS: Behavior pattern are also affecting patient's functional outcome.   REHAB POTENTIAL: Good  CLINICAL DECISION MAKING: Stable/uncomplicated  EVALUATION COMPLEXITY: Low   GOALS: Goals reviewed with patient? Yes  SHORT TERM GOALS: Target date: 03/05/24  Pt will tolerate full aquatic sessions consistently without  increase in pain and with improving function to demonstrate good toleration and effectiveness of intervention.  Baseline: Goal status: INITIAL  2.  Pt to report compliance with daily exercise of walking or using station bike/elliptical (at home) Baseline:  Goal status: INITIAL    LONG TERM GOALS: Target date: 03/25/24  Pt will improve on her ABC score above 80% to demonstrate a High level of physical function Baseline: 123/16=76% confident Goal status:  INITIAL  2.  Pt will be indep and compliant with final HEP's (land and aquatic as appropriate) for continued management of condition Baseline:  Goal status: INITIAL  3.  Pt will improve strength in hips by at least 5 lbs to demonstrate improved overall physical function Baseline:  Goal status: INITIAL  4.  Pt will improve on 5 X STS test to <or= 21s to demonstrate improving functional lower extremity strength, transitional movements, and balance. (MDC = 4.2sec)   Baseline: 26.02 with uncontrolled sit Goal status: INITIAL    PLAN:  PT FREQUENCY: 2x/week  PT DURATION: 6 weeks  PLANNED INTERVENTIONS: [id number]- PT Re-evaluation, 97750- Physical Performance Testing, 97110-Therapeutic exercises, 97530- Therapeutic activity, [id number]- Neuromuscular re-education, 97535- Self Care, [id number]- Manual therapy, [id number]- Gait training, [phone]- Orthotic Initial, [phone]- Aquatic Therapy, [phone]- Electrical stimulation (unattended), [phone]- Electrical stimulation (manual), [id number]- Ionotophoresis 4mg /ml Dexamethasone , 79439 (1-2 muscles), 20561 (3+ muscles)- Dry Needling, Patient/Family education, Balance training, Stair training, Taping, Joint mobilization, DME instructions, Cryotherapy, and Moist heat.  PLAN FOR NEXT SESSION: aquatics instruct on hep for le and core strengthening; balance retraining Land: loaded as tolerated for general strengthening and to encourage improve bone health; HEP   Ronal Hitchcock) Sharita Bienaime MPT 02/22/2024 4:19 PM [facility] [address] [place], [place], [address] Phone: [phone]   Fax:  [phone]    "

## 2024-02-26 ENCOUNTER — Encounter (HOSPITAL_BASED_OUTPATIENT_CLINIC_OR_DEPARTMENT_OTHER): Payer: Self-pay | Admitting: Physical Therapy

## 2024-02-26 ENCOUNTER — Ambulatory Visit (HOSPITAL_BASED_OUTPATIENT_CLINIC_OR_DEPARTMENT_OTHER): Attending: Physical Medicine and Rehabilitation | Admitting: Physical Therapy

## 2024-02-26 DIAGNOSIS — M6281 Muscle weakness (generalized): Secondary | ICD-10-CM | POA: Insufficient documentation

## 2024-02-26 DIAGNOSIS — R2689 Other abnormalities of gait and mobility: Secondary | ICD-10-CM | POA: Diagnosis present

## 2024-02-26 DIAGNOSIS — M810 Age-related osteoporosis without current pathological fracture: Secondary | ICD-10-CM | POA: Diagnosis present

## 2024-02-26 NOTE — Therapy (Signed)
 " OUTPATIENT PHYSICAL THERAPY THORACOLUMBAR TREATMENT   Patient Name: Alicia Wilkerson MRN: [id number] DOB:1951/01/05, 74 y.o., female Today's Date: 02/07/2024  END OF SESSION:   PT End of Session - 02/26/24 1452     Visit Number 5    Date for Recertification  03/25/24    Authorization Type aetna medicare    Progress Note Due on Visit 10    PT Start Time 1435    PT Stop Time 1514    PT Time Calculation (min) 39 min    Activity Tolerance Patient tolerated treatment well    Behavior During Therapy [facility] for tasks assessed/performed                Past Medical History:  Diagnosis Date   Anemia    Anxiety disorder    Basal cell carcinoma (BCC) of left upper arm 12/2015   Basal cell carcinoma (BCC) of right lower leg 04/2016   Borderline hypertension    CAD (coronary artery disease)    a. nonobst by cor CT 12/2018.   CIN II (cervical intraepithelial neoplasia II)    CIN  1   Depression    Eczema    Family history of malignant neoplasm of gastrointestinal tract 08/14/2020   Family history of premature CAD    Hard of hearing    wears hearing aids   History of basal cell carcinoma (BCC) excision    left upper arm 2017;   right lower leg 2018;  bilateral lower leg and hip area 2019;   inner right lower leg, outer right thigh, & upper outside left arm 09/ 2020   History of cervical dysplasia 2013   History of hereditary spherocytosis    s/p splenectomy in 1954   History of hyperparathyroidism    per pt yrs ago was put on mega dose of vit d which caused the hyperparathyroid resolved after stopping vit d   History of pelvic fracture 2013   Ischemic stroke (HCC) 08/07/2023   Left inguinal hernia 01/02/2023   MAI (mycobacterium avium-intracellulare) (HCC)    ? possibly by CT 12/2018   Mild hyperlipidemia    Osteoporosis    PONV (postoperative nausea and vomiting)    S/P hernia repair 03/02/2023   S/P splenectomy    spherocytosis   Wears glasses    White coat syndrome  without diagnosis of hypertension    Past Surgical History:  Procedure Laterality Date   ANKLE SURGERY  03/14/2015   BREAST BIOPSY Left 08/30/2021   CYSTIC APOCRINE METAPLASIA   CERVICAL CONIZATION W/BX N/A 12/13/2018   Procedure: CONIZATION CERVIX WITH BIOPSY;  Surgeon: Cleotilde Ronal RAMAN, MD;  Location: [facility] OR;  Service: Gynecology;  Laterality: N/A;   CYSTOSCOPY N/A 10/18/2019   Procedure: CYSTOSCOPY;  Surgeon: Cleotilde Ronal RAMAN, MD;  Location: [facility];  Service: Gynecology;  Laterality: N/A;   IR RADIOLOGY PERIPHERAL GUIDED IV START  01/05/2019   IR US  GUIDE VASC ACCESS RIGHT  01/05/2019   KNEE SURGERY Left 1991   per pt retained hardward   LEEP N/A 12/13/2018   Procedure: possible LOOP ELECTROSURGICAL EXCISION PROCEDURE (LEEP);  Surgeon: Cleotilde Ronal RAMAN, MD;  Location: [facility] OR;  Service: Gynecology;  Laterality: N/A;   ORIF ANKLE FRACTURE Left 03/14/2015   per pt retained hardware   ORIF PATELLA Right 10/19/2023   Procedure: OPEN REDUCTION INTERNAL FIXATION (ORIF) PATELLA;  Surgeon: Kay Kemps, MD;  Location: [facility] ORS;  Service: Orthopedics;  Laterality: Right;   ORIF WRIST FRACTURE  Left 03/10/2022   Procedure: OPEN REDUCTION INTERNAL FIXATION (ORIF) WRIST FRACTURE;  Surgeon: Shari Easter, MD;  Location: [facility] OR;  Service: Orthopedics;  Laterality: Left;  regional with iv sedation   SPLENECTOMY, TOTAL  1954   due to spherocytosis   TOTAL LAPAROSCOPIC HYSTERECTOMY WITH SALPINGECTOMY N/A 10/18/2019   Procedure: TOTAL LAPAROSCOPIC HYSTERECTOMY WITH BILATERAL SALPINGECTOMY AND BILATERAL OOPHORECTOMY;  Surgeon: Cleotilde Ronal RAMAN, MD;  Location: [facility];  Service: Gynecology;  Laterality: N/A;  BILATERAL SALPINGECTOMY POSS OOPHORECTOMY   TUBAL LIGATION Bilateral 1995   Patient Active Problem List   Diagnosis Date Noted   B12 deficiency 11/25/2023   Internal hemorrhoids 11/25/2023   Anemia 11/25/2023   History of CVA (cerebrovascular accident) 11/10/2023    Cognitive impairment 10/29/2023   At risk for falls 10/29/2023   Right patella fracture 10/19/2023   Carotid artery disease 08/25/2023   Essential (primary) hypertension 08/13/2023   ICAO (internal carotid artery occlusion), right 08/12/2023   PSVT (paroxysmal supraventricular tachycardia) 08/11/2023   Residual cognitive deficit as late effect of cerebrovascular accident 08/09/2023   Lower back pain 01/02/2023   Vitamin D  deficiency 11/24/2022   Aortic atherosclerosis 11/04/2019   HLD (hyperlipidemia) 11/03/2019   CAD (coronary artery disease) 11/03/2019   S/P total hysterectomy and bilateral salpingo-oophorectomy, 2021 11/03/2019   Hearing loss 10/13/2017   History of basal cell carcinoma (BCC) 05/19/2017   Anxiety 10/10/2016   Eczema 10/10/2016   H/O splenectomy for hemolytic spherocytosis 10/10/2016   H/O basal cell carcinoma excision 10/10/2016    PCP: Glade Hope MD  REFERRING PROVIDER: Lorilee Sven SQUIBB, MD   REFERRING DIAG:  M81.0 (ICD-10-CM) - Osteoporosis, unspecified osteoporosis type, unspecified pathological fracture presence  I63.9 (ICD-10-CM) - Cerebrovascular accident (CVA), unspecified mechanism (HCC)  I63.511 (ICD-10-CM) - Right middle cerebral artery stroke (HCC)    Rationale for Evaluation and Treatment: Rehabilitation  THERAPY DIAG:  Muscle weakness Osteoporosis without current pathological fx Other abnormalities of gait and mobility  ONSET DATE: chronic  SUBJECTIVE:                                                                                                                                                                                           SUBJECTIVE STATEMENT:   I felt the hips last time after I saw Matilda the other day. Tried jumping a little but it didn't work like I anticipated, didn't have the same spring I used to have.    Initial Subjective Done with rehab for my knee.  Want to increase my core strength and cardio. I walk around  the block and ride stationary bike  a few  times a week for about 15 mins. No pain.  PERTINENT HISTORY:  Right patella fx with ORIF 8/25 L wrist fx with ORIF 1/24 L ankle fx 2017  PAIN:  Are you having pain? No 0/10 currently   PRECAUTIONS: Fall  RED FLAGS: None   WEIGHT BEARING RESTRICTIONS: No  FALLS:  Has patient fallen in last 6 months? No  OCCUPATION: retired  PLOF: Independent  PATIENT GOALS: increase core strength, improve bone health  NEXT MD VISIT: as needed  OBJECTIVE:  Note: Objective measures were completed at Evaluation unless otherwise noted.  DIAGNOSTIC FINDINGS:  10/25 MRI lumbar spine (without) demonstrating: - Rotoscoliosis convex right centered at L2.  Mild spondylosis and disc bulging as above.   - No spinal stenosis or foraminal narrowing  MRI thoracic spine (without) demonstrating: - Mild scoliosis, degenerative spondylosis and disc bulging as above.  No spinal stenosis or foraminal narrowing.  PATIENT SURVEYS:  ABC:123/16=76% confident  COGNITION: Overall cognitive status: Within functional limits for tasks assessed       POSTURE: Rotoscoliosis convex right centered at L2    LUMBAR ROM:   WFL  LOWER EXTREMITY ROM:     WFL  LOWER EXTREMITY MMT:    MMT Right eval Left eval  Hip flexion 27.6 27.8  Hip extension    Hip abduction 19.5 23.2  Hip adduction    Hip internal rotation    Hip external rotation    Knee flexion    Knee extension 39.4 47.6  Ankle dorsiflexion    Ankle plantarflexion    Ankle inversion    Ankle eversion     (Blank rows = not tested)   FUNCTIONAL TESTS:  Berg: 44/56 5 x STS: 26.02 uncontrolled sit TUG: 10.87   GAIT: Distance walked: 500 ft Assistive device utilized: None Level of assistance: Complete Independence Comments: small BOS  TREATMENT  [facility] Adult PT Treatment:                                                  DATE:   03/01/24  Nustep L4x6 minutes BLEs only  Bridges + ABD  into red TB x12 Sidelying clams red TB x12 B STS red TB around knees x10 Hip hikes x12 B Encouraged regular wt bearing exercise like a walking program, also educated on benefit of resistance training to help build bone mass and counter osteoporosis/penia   Tandem stance in corner 4x30 seconds alternating Tandem walking at bar minA  Wide tandem on blue foam 4x30 seconds alternating     02/22/24 Pt seen for aquatic therapy today.  Treatment took place in water 3.5-4.75 ft in depth at the [facility] pool. Temp of water was 91.  Pt entered/exited the pool via stairs using alternating step pattern with hand rail.  *walking forward, back and side stepping in 3.6-4.0 ft unsupported *Side stepping-> ue add/abd RBHB-> horizontal add/abd *suitcase carry using RBHB bilaterally then unilaterally marching forward and backward *Step ups bottom step leading R/L ue supported->unsupported x 10 *stair tapping 1st step then 2nd step alternating x 10   *resisted hip abd in seated position using rider band 2 x 10 *resisted side stepping using rider band R/L *traditional squats with hip isometric abd using rider band *1/2 noodle pull down for TrA engagement wide stance than staggered *decompression position with noodle wrapped  posteriorly across chest: cycling  Pt requires the buoyancy and hydrostatic pressure of water for support, and to offload joints by unweighting joint load by at least 50 % in navel deep water and by at least 75-80% in chest to neck deep water.  Viscosity of the water is needed for resistance of strengthening. Water current perturbations provides challenge to standing balance requiring increased core activation.   DATE: 02/17/24  -Nu-step L4 x60min -Sit to stands with green band around thighs  -HR/TR 3x10 -partial squats at rail x10 -standing hip abduction /extension 2# 2x10ea -standing marching 2# 2x10 -standing HSC 2# 2x10ea -LAQ 4# 5 hold 2x10ea -s/l hip  abduction 2x10ea                                                                                                                                        PATIENT EDUCATION:  Education details: Discussed eval findings, rehab rationale, aquatic program progression/POC and pools in area. Patient is in agreement  Person educated: Patient Education method: Explanation Education comprehension: verbalized understanding  HOME EXERCISE PROGRAM: Pt following HEP assigned by land therapy at other clinic (recently) DC for rehab patella fx Aquatic tba  Prior HEP from other PT for patella- quad sets, SLRs, SAQs, standing hip ABD, standing marches, standing hip extensions, added below:   Access Code: [email or web address] URL: [email or web address] Date: 02/26/2024 Prepared by: Josette Rough  ASSESSMENT:  CLINICAL IMPRESSION:  Arrives today doing well, we continued to work on progressing land based functional strengthening and also fortified land based HEP today as well. Added exercises for home using TB resistance to help build bone health and strength outside of PT, also incorporated some early balance work to help with fall prevention moving forward. Tolerated all interventions well today.        Initial Impression Patient is a 74 y.o. f who was seen today for physical therapy evaluation and treatment for OP with past related pathological fx. She has just completed an episode of PT at another clinic s/p Right patella fx with ORIF 8/25. Pt reports at baseline she is not very active but is trying to increase her activity at MD suggestion to improve bone health as since 2017 she has had 3 pathological fxs.  She has not had a recent bone density test completed.  Testing today demonstrates reduced strength in core and hips.  Requires UE assist to rise from chairs.  She is doing some low level exercise a few times a week but as she reports not getting her heart rate up. She will benefit from  skilled PT to improve all areas and deficit and instruct on HEP to improve bone health. Plan to see her in aquatics a few times for instruction on water exercise although majority of sessions to be land based for loading to progress bone health.  OBJECTIVE IMPAIRMENTS: decreased activity tolerance and lack of motivation.   ACTIVITY LIMITATIONS: locomotion level  PERSONAL FACTORS:  Behavior pattern are also affecting patient's functional outcome.   REHAB POTENTIAL: Good  CLINICAL DECISION MAKING: Stable/uncomplicated  EVALUATION COMPLEXITY: Low   GOALS: Goals reviewed with patient? Yes  SHORT TERM GOALS: Target date: 03/05/24  Pt will tolerate full aquatic sessions consistently without increase in pain and with improving function to demonstrate good toleration and effectiveness of intervention.  Baseline: Goal status: INITIAL  2.  Pt to report compliance with daily exercise of walking or using station bike/elliptical (at home) Baseline:  Goal status: INITIAL    LONG TERM GOALS: Target date: 03/25/24  Pt will improve on her ABC score above 80% to demonstrate a High level of physical function Baseline: 123/16=76% confident Goal status: INITIAL  2.  Pt will be indep and compliant with final HEP's (land and aquatic as appropriate) for continued management of condition Baseline:  Goal status: INITIAL  3.  Pt will improve strength in hips by at least 5 lbs to demonstrate improved overall physical function Baseline:  Goal status: INITIAL  4.  Pt will improve on 5 X STS test to <or= 21s to demonstrate improving functional lower extremity strength, transitional movements, and balance. (MDC = 4.2sec)   Baseline: 26.02 with uncontrolled sit Goal status: INITIAL    PLAN:  PT FREQUENCY: 2x/week  PT DURATION: 6 weeks  PLANNED INTERVENTIONS: [id number]- PT Re-evaluation, 97750- Physical Performance Testing, 97110-Therapeutic exercises, 97530- Therapeutic activity, 97112- Neuromuscular  re-education, 97535- Self Care, [id number]- Manual therapy, [id number]- Gait training, [phone]- Orthotic Initial, [phone]- Aquatic Therapy, [phone]- Electrical stimulation (unattended), [phone]- Electrical stimulation (manual), [id number]- Ionotophoresis 4mg /ml Dexamethasone , 79439 (1-2 muscles), [id number] (3+ muscles)- Dry Needling, Patient/Family education, Balance training, Stair training, Taping, Joint mobilization, DME instructions, Cryotherapy, and Moist heat.  PLAN FOR NEXT SESSION: aquatics instruct on hep for le and core strengthening; balance retraining Land: loaded as tolerated for general strengthening and to encourage improve bone health; HEP updates PRN    Josette Rough, PT, DPT 02/26/2024 3:15 PM     "

## 2024-03-02 ENCOUNTER — Encounter (HOSPITAL_BASED_OUTPATIENT_CLINIC_OR_DEPARTMENT_OTHER): Payer: Self-pay | Admitting: Physical Therapy

## 2024-03-02 ENCOUNTER — Ambulatory Visit (HOSPITAL_BASED_OUTPATIENT_CLINIC_OR_DEPARTMENT_OTHER): Admitting: Physical Therapy

## 2024-03-02 DIAGNOSIS — M810 Age-related osteoporosis without current pathological fracture: Secondary | ICD-10-CM

## 2024-03-02 DIAGNOSIS — R2689 Other abnormalities of gait and mobility: Secondary | ICD-10-CM | POA: Diagnosis not present

## 2024-03-02 DIAGNOSIS — M6281 Muscle weakness (generalized): Secondary | ICD-10-CM

## 2024-03-02 NOTE — Therapy (Signed)
 " OUTPATIENT PHYSICAL THERAPY THORACOLUMBAR TREATMENT   Patient Name: Alicia Wilkerson MRN: [id number] DOB:Jun 30, 1950, 74 y.o., female Today's Date: 02/07/2024  END OF SESSION:   PT End of Session - 03/02/24 1451     Visit Number 6    Date for Recertification  03/25/24    Authorization Type aetna medicare    Progress Note Due on Visit 10    PT Start Time 1443    PT Stop Time 1525    PT Time Calculation (min) 42 min    Activity Tolerance Patient tolerated treatment well    Behavior During Therapy [facility] for tasks assessed/performed                Past Medical History:  Diagnosis Date   Anemia    Anxiety disorder    Basal cell carcinoma (BCC) of left upper arm 12/2015   Basal cell carcinoma (BCC) of right lower leg 04/2016   Borderline hypertension    CAD (coronary artery disease)    a. nonobst by cor CT 12/2018.   CIN II (cervical intraepithelial neoplasia II)    CIN  1   Depression    Eczema    Family history of malignant neoplasm of gastrointestinal tract 08/14/2020   Family history of premature CAD    Hard of hearing    wears hearing aids   History of basal cell carcinoma (BCC) excision    left upper arm 2017;   right lower leg 2018;  bilateral lower leg and hip area 2019;   inner right lower leg, outer right thigh, & upper outside left arm 09/ 2020   History of cervical dysplasia 2013   History of hereditary spherocytosis    s/p splenectomy in 1954   History of hyperparathyroidism    per pt yrs ago was put on mega dose of vit d which caused the hyperparathyroid resolved after stopping vit d   History of pelvic fracture 2013   Ischemic stroke (HCC) 08/07/2023   Left inguinal hernia 01/02/2023   MAI (mycobacterium avium-intracellulare) (HCC)    ? possibly by CT 12/2018   Mild hyperlipidemia    Osteoporosis    PONV (postoperative nausea and vomiting)    S/P hernia repair 03/02/2023   S/P splenectomy    spherocytosis   Wears glasses    White coat syndrome  without diagnosis of hypertension    Past Surgical History:  Procedure Laterality Date   ANKLE SURGERY  03/14/2015   BREAST BIOPSY Left 08/30/2021   CYSTIC APOCRINE METAPLASIA   CERVICAL CONIZATION W/BX N/A 12/13/2018   Procedure: CONIZATION CERVIX WITH BIOPSY;  Surgeon: Cleotilde Ronal RAMAN, MD;  Location: [facility] OR;  Service: Gynecology;  Laterality: N/A;   CYSTOSCOPY N/A 10/18/2019   Procedure: CYSTOSCOPY;  Surgeon: Cleotilde Ronal RAMAN, MD;  Location: [facility];  Service: Gynecology;  Laterality: N/A;   IR RADIOLOGY PERIPHERAL GUIDED IV START  01/05/2019   IR US  GUIDE VASC ACCESS RIGHT  01/05/2019   KNEE SURGERY Left 1991   per pt retained hardward   LEEP N/A 12/13/2018   Procedure: possible LOOP ELECTROSURGICAL EXCISION PROCEDURE (LEEP);  Surgeon: Cleotilde Ronal RAMAN, MD;  Location: [facility] OR;  Service: Gynecology;  Laterality: N/A;   ORIF ANKLE FRACTURE Left 03/14/2015   per pt retained hardware   ORIF PATELLA Right 10/19/2023   Procedure: OPEN REDUCTION INTERNAL FIXATION (ORIF) PATELLA;  Surgeon: Kay Kemps, MD;  Location: [facility] ORS;  Service: Orthopedics;  Laterality: Right;   ORIF WRIST FRACTURE  Left 03/10/2022   Procedure: OPEN REDUCTION INTERNAL FIXATION (ORIF) WRIST FRACTURE;  Surgeon: Shari Easter, MD;  Location: [facility] OR;  Service: Orthopedics;  Laterality: Left;  regional with iv sedation   SPLENECTOMY, TOTAL  1954   due to spherocytosis   TOTAL LAPAROSCOPIC HYSTERECTOMY WITH SALPINGECTOMY N/A 10/18/2019   Procedure: TOTAL LAPAROSCOPIC HYSTERECTOMY WITH BILATERAL SALPINGECTOMY AND BILATERAL OOPHORECTOMY;  Surgeon: Cleotilde Ronal RAMAN, MD;  Location: [facility];  Service: Gynecology;  Laterality: N/A;  BILATERAL SALPINGECTOMY POSS OOPHORECTOMY   TUBAL LIGATION Bilateral 1995   Patient Active Problem List   Diagnosis Date Noted   B12 deficiency 11/25/2023   Internal hemorrhoids 11/25/2023   Anemia 11/25/2023   History of CVA (cerebrovascular accident) 11/10/2023    Cognitive impairment 10/29/2023   At risk for falls 10/29/2023   Right patella fracture 10/19/2023   Carotid artery disease 08/25/2023   Essential (primary) hypertension 08/13/2023   ICAO (internal carotid artery occlusion), right 08/12/2023   PSVT (paroxysmal supraventricular tachycardia) 08/11/2023   Residual cognitive deficit as late effect of cerebrovascular accident 08/09/2023   Lower back pain 01/02/2023   Vitamin D  deficiency 11/24/2022   Aortic atherosclerosis 11/04/2019   HLD (hyperlipidemia) 11/03/2019   CAD (coronary artery disease) 11/03/2019   S/P total hysterectomy and bilateral salpingo-oophorectomy, 2021 11/03/2019   Hearing loss 10/13/2017   History of basal cell carcinoma (BCC) 05/19/2017   Anxiety 10/10/2016   Eczema 10/10/2016   H/O splenectomy for hemolytic spherocytosis 10/10/2016   H/O basal cell carcinoma excision 10/10/2016    PCP: Glade Hope MD  REFERRING PROVIDER: Lorilee Sven SQUIBB, MD   REFERRING DIAG:  M81.0 (ICD-10-CM) - Osteoporosis, unspecified osteoporosis type, unspecified pathological fracture presence  I63.9 (ICD-10-CM) - Cerebrovascular accident (CVA), unspecified mechanism (HCC)  I63.511 (ICD-10-CM) - Right middle cerebral artery stroke (HCC)    Rationale for Evaluation and Treatment: Rehabilitation  THERAPY DIAG:  Muscle weakness Osteoporosis without current pathological fx Other abnormalities of gait and mobility  ONSET DATE: chronic  SUBJECTIVE:                                                                                                                                                                                           SUBJECTIVE STATEMENT:   I felt the hips last time after I saw Matilda the other day. Tried jumping a little but it didn't work like I anticipated, didn't have the same spring I used to have.    Initial Subjective Done with rehab for my knee.  Want to increase my core strength and cardio. I walk around  the block and ride stationary bike  a few  times a week for about 15 mins. No pain.  PERTINENT HISTORY:  Right patella fx with ORIF 8/25 L wrist fx with ORIF 1/24 L ankle fx 2017  PAIN:  Are you having pain? No 0/10 currently   PRECAUTIONS: Fall  RED FLAGS: None   WEIGHT BEARING RESTRICTIONS: No  FALLS:  Has patient fallen in last 6 months? No  OCCUPATION: retired  PLOF: Independent  PATIENT GOALS: increase core strength, improve bone health  NEXT MD VISIT: as needed  OBJECTIVE:  Note: Objective measures were completed at Evaluation unless otherwise noted.  DIAGNOSTIC FINDINGS:  10/25 MRI lumbar spine (without) demonstrating: - Rotoscoliosis convex right centered at L2.  Mild spondylosis and disc bulging as above.   - No spinal stenosis or foraminal narrowing  MRI thoracic spine (without) demonstrating: - Mild scoliosis, degenerative spondylosis and disc bulging as above.  No spinal stenosis or foraminal narrowing.  PATIENT SURVEYS:  ABC:123/16=76% confident  COGNITION: Overall cognitive status: Within functional limits for tasks assessed       POSTURE: Rotoscoliosis convex right centered at L2    LUMBAR ROM:   WFL  LOWER EXTREMITY ROM:     WFL  LOWER EXTREMITY MMT:    MMT Right eval Left eval  Hip flexion 27.6 27.8  Hip extension    Hip abduction 19.5 23.2  Hip adduction    Hip internal rotation    Hip external rotation    Knee flexion    Knee extension 39.4 47.6  Ankle dorsiflexion    Ankle plantarflexion    Ankle inversion    Ankle eversion     (Blank rows = not tested)   FUNCTIONAL TESTS:  Berg: 44/56 5 x STS: 26.02 uncontrolled sit TUG: 10.87   GAIT: Distance walked: 500 ft Assistive device utilized: None Level of assistance: Complete Independence Comments: small BOS  TREATMENT  [facility] Adult PT Treatment:                                                  DATE:  03/02/2024 Pt seen for aquatic therapy today.  Treatment took  place in water 3.5-4.75 ft in depth at the [facility] pool. Temp of water was 91.  Pt entered/exited the pool via stairs using alternating step pattern with hand rail.  *walking forward, back and side stepping in 3.6-4.0 ft unsupported *Side stepping-> ue add/abd RBHB-> yellow *Step ups bottom step leading R/L unsupported x 10->from bottom step to 2nd step ue support->unsupported *stair tapping 1st step then 2nd step alternating x 10 unsupported *STS from 3rd step x 5 *hip hinges 2 x 10.  VC and demonstration. 2 x10 good execution. *resisted hip extension using rider band: hip abd R/L 2 x 10 *Hip hiking on bottom step R/L x 10 with 5s hold  *decompression position with noodle wrapped  posteriorly across chest: cycling  Pt requires the buoyancy and hydrostatic pressure of water for support, and to offload joints by unweighting joint load by at least 50 % in navel deep water and by at least 75-80% in chest to neck deep water.  Viscosity of the water is needed for resistance of strengthening. Water current perturbations provides challenge to standing balance requiring increased core activation.    03/01/24  Nustep L4x6 minutes BLEs only  Bridges + ABD into red TB x12 Sidelying clams red TB x12  B STS red TB around knees x10 Hip hikes x12 B Encouraged regular wt bearing exercise like a walking program, also educated on benefit of resistance training to help build bone mass and counter osteoporosis/penia   Tandem stance in corner 4x30 seconds alternating Tandem walking at bar minA  Wide tandem on blue foam 4x30 seconds alternating     02/22/24 Pt seen for aquatic therapy today.  Treatment took place in water 3.5-4.75 ft in depth at the [facility] pool. Temp of water was 91.  Pt entered/exited the pool via stairs using alternating step pattern with hand rail.  *walking forward, back and side stepping in 3.6-4.0 ft unsupported *Side stepping-> ue add/abd RBHB->  horizontal add/abd *suitcase carry using RBHB bilaterally then unilaterally marching forward and backward *Step ups bottom step leading R/L ue supported->unsupported x 10 *stair tapping 1st step then 2nd step alternating x 10   *resisted hip abd in seated position using rider band 2 x 10 *resisted side stepping using rider band R/L *traditional squats with hip isometric abd using rider band *1/2 noodle pull down for TrA engagement wide stance than staggered *decompression position with noodle wrapped  posteriorly across chest: cycling  Pt requires the buoyancy and hydrostatic pressure of water for support, and to offload joints by unweighting joint load by at least 50 % in navel deep water and by at least 75-80% in chest to neck deep water.  Viscosity of the water is needed for resistance of strengthening. Water current perturbations provides challenge to standing balance requiring increased core activation.   DATE: 02/17/24  -Nu-step L4 x53min -Sit to stands with green band around thighs  -HR/TR 3x10 -partial squats at rail x10 -standing hip abduction /extension 2# 2x10ea -standing marching 2# 2x10 -standing HSC 2# 2x10ea -LAQ 4# 5 hold 2x10ea -s/l hip abduction 2x10ea                                                                                                                                        PATIENT EDUCATION:  Education details: Discussed eval findings, rehab rationale, aquatic program progression/POC and pools in area. Patient is in agreement  Person educated: Patient Education method: Explanation Education comprehension: verbalized understanding  HOME EXERCISE PROGRAM: Pt following HEP assigned by land therapy at other clinic (recently) DC for rehab patella fx Aquatic tba  Prior HEP from other PT for patella- quad sets, SLRs, SAQs, standing hip ABD, standing marches, standing hip extensions, added below:   Access Code: [email or web address] URL:  [email or web address] Date: 02/26/2024 Prepared by: Josette Rough  ASSESSMENT:  CLINICAL IMPRESSION: Pt reports a little soreness in hips after last land session.  She reports compliance with assigned HEP as well as her own daily walking. She does report 6 trips since therapy has begun but is able to recover from them without falling. Focused on glut/hip strengthening.  Added loading as able in setting.  She  demonstrates improving dynamic balance submerged as she increases strength and confidence in setting.  Good toleration and 0/10 pain today. Goals ongoing         Initial Impression Patient is a 74 y.o. f who was seen today for physical therapy evaluation and treatment for OP with past related pathological fx. She has just completed an episode of PT at another clinic s/p Right patella fx with ORIF 8/25. Pt reports at baseline she is not very active but is trying to increase her activity at MD suggestion to improve bone health as since 2017 she has had 3 pathological fxs.  She has not had a recent bone density test completed.  Testing today demonstrates reduced strength in core and hips.  Requires UE assist to rise from chairs.  She is doing some low level exercise a few times a week but as she reports not getting her heart rate up. She will benefit from skilled PT to improve all areas and deficit and instruct on HEP to improve bone health. Plan to see her in aquatics a few times for instruction on water exercise although majority of sessions to be land based for loading to progress bone health.  OBJECTIVE IMPAIRMENTS: decreased activity tolerance and lack of motivation.   ACTIVITY LIMITATIONS: locomotion level  PERSONAL FACTORS: Behavior pattern are also affecting patient's functional outcome.   REHAB POTENTIAL: Good  CLINICAL DECISION MAKING: Stable/uncomplicated  EVALUATION COMPLEXITY: Low   GOALS: Goals reviewed with patient? Yes  SHORT TERM GOALS: Target  date: 03/05/24  Pt will tolerate full aquatic sessions consistently without increase in pain and with improving function to demonstrate good toleration and effectiveness of intervention.  Baseline: Goal status: Met 03/02/24  2.  Pt to report compliance with daily exercise of walking or using station bike/elliptical (at home) Baseline:  Goal status: Met 03/02/24    LONG TERM GOALS: Target date: 03/25/24  Pt will improve on her ABC score above 80% to demonstrate a High level of physical function Baseline: 123/16=76% confident Goal status: INITIAL  2.  Pt will be indep and compliant with final HEP's (land and aquatic as appropriate) for continued management of condition Baseline:  Goal status: INITIAL  3.  Pt will improve strength in hips by at least 5 lbs to demonstrate improved overall physical function Baseline:  Goal status: INITIAL  4.  Pt will improve on 5 X STS test to <or= 21s to demonstrate improving functional lower extremity strength, transitional movements, and balance. (MDC = 4.2sec)   Baseline: 26.02 with uncontrolled sit Goal status: INITIAL    PLAN:  PT FREQUENCY: 2x/week  PT DURATION: 6 weeks  PLANNED INTERVENTIONS: [id number]- PT Re-evaluation, 97750- Physical Performance Testing, 97110-Therapeutic exercises, 97530- Therapeutic activity, [id number]- Neuromuscular re-education, 97535- Self Care, [id number]- Manual therapy, [id number]- Gait training, [phone]- Orthotic Initial, [phone]- Aquatic Therapy, [phone]- Electrical stimulation (unattended), [phone]- Electrical stimulation (manual), [id number]- Ionotophoresis 4mg /ml Dexamethasone , 79439 (1-2 muscles), [id number] (3+ muscles)- Dry Needling, Patient/Family education, Balance training, Stair training, Taping, Joint mobilization, DME instructions, Cryotherapy, and Moist heat.  PLAN FOR NEXT SESSION: aquatics instruct on hep for le and core strengthening; balance retraining Land: loaded as tolerated for general strengthening and to encourage improve bone  health; HEP updates PRN    Ronal Foots) Lynnita Somma MPT 03/02/2024 2:52 PM [facility] [address] [place], [place], [address] Phone: [phone]   Fax:  [phone]      "

## 2024-03-09 ENCOUNTER — Ambulatory Visit (HOSPITAL_BASED_OUTPATIENT_CLINIC_OR_DEPARTMENT_OTHER): Admitting: Physical Therapy

## 2024-03-09 ENCOUNTER — Encounter (HOSPITAL_BASED_OUTPATIENT_CLINIC_OR_DEPARTMENT_OTHER): Payer: Self-pay | Admitting: Physical Therapy

## 2024-03-09 DIAGNOSIS — R2689 Other abnormalities of gait and mobility: Secondary | ICD-10-CM | POA: Diagnosis not present

## 2024-03-09 DIAGNOSIS — M6281 Muscle weakness (generalized): Secondary | ICD-10-CM

## 2024-03-09 DIAGNOSIS — M810 Age-related osteoporosis without current pathological fracture: Secondary | ICD-10-CM

## 2024-03-09 NOTE — Therapy (Signed)
 " OUTPATIENT PHYSICAL THERAPY THORACOLUMBAR TREATMENT   Patient Name: Alicia Wilkerson MRN: [id number] DOB:1950-06-23, 74 y.o., female Today's Date: 02/07/2024  END OF SESSION:   PT End of Session - 03/09/24 1402     Visit Number 7    Date for Recertification  03/25/24    Authorization Type aetna medicare    Progress Note Due on Visit 10    PT Start Time 1401    PT Stop Time 1440    PT Time Calculation (min) 39 min    Activity Tolerance Patient tolerated treatment well    Behavior During Therapy [facility] for tasks assessed/performed                 Past Medical History:  Diagnosis Date   Anemia    Anxiety disorder    Basal cell carcinoma (BCC) of left upper arm 12/2015   Basal cell carcinoma (BCC) of right lower leg 04/2016   Borderline hypertension    CAD (coronary artery disease)    a. nonobst by cor CT 12/2018.   CIN II (cervical intraepithelial neoplasia II)    CIN  1   Depression    Eczema    Family history of malignant neoplasm of gastrointestinal tract 08/14/2020   Family history of premature CAD    Hard of hearing    wears hearing aids   History of basal cell carcinoma (BCC) excision    left upper arm 2017;   right lower leg 2018;  bilateral lower leg and hip area 2019;   inner right lower leg, outer right thigh, & upper outside left arm 09/ 2020   History of cervical dysplasia 2013   History of hereditary spherocytosis    s/p splenectomy in 1954   History of hyperparathyroidism    per pt yrs ago was put on mega dose of vit d which caused the hyperparathyroid resolved after stopping vit d   History of pelvic fracture 2013   Ischemic stroke (HCC) 08/07/2023   Left inguinal hernia 01/02/2023   MAI (mycobacterium avium-intracellulare) (HCC)    ? possibly by CT 12/2018   Mild hyperlipidemia    Osteoporosis    PONV (postoperative nausea and vomiting)    S/P hernia repair 03/02/2023   S/P splenectomy    spherocytosis   Wears glasses    White coat syndrome  without diagnosis of hypertension    Past Surgical History:  Procedure Laterality Date   ANKLE SURGERY  03/14/2015   BREAST BIOPSY Left 08/30/2021   CYSTIC APOCRINE METAPLASIA   CERVICAL CONIZATION W/BX N/A 12/13/2018   Procedure: CONIZATION CERVIX WITH BIOPSY;  Surgeon: Cleotilde Ronal RAMAN, MD;  Location[facility] OR;  Service: Gynecology;  Laterality: N/A;   CYSTOSCOPY N/A 10/18/2019   Procedure: CYSTOSCOPY;  Surgeon: Cleotilde Ronal RAMAN, MD;  Location: [facility];  Service: Gynecology;  Laterality: N/A;   IR RADIOLOGY PERIPHERAL GUIDED IV START  01/05/2019   IR US  GUIDE VASC ACCESS RIGHT  01/05/2019   KNEE SURGERY Left 1991   per pt retained hardward   LEEP N/A 12/13/2018   Procedure: possible LOOP ELECTROSURGICAL EXCISION PROCEDURE (LEEP);  Surgeon: Cleotilde Ronal RAMAN, MD;  Location: [facility] OR;  Service: Gynecology;  Laterality: N/A;   ORIF ANKLE FRACTURE Left 03/14/2015   per pt retained hardware   ORIF PATELLA Right 10/19/2023   Procedure: OPEN REDUCTION INTERNAL FIXATION (ORIF) PATELLA;  Surgeon: Kay Kemps, MD;  Location: [facility] ORS;  Service: Orthopedics;  Laterality: Right;   ORIF WRIST  FRACTURE Left 03/10/2022   Procedure: OPEN REDUCTION INTERNAL FIXATION (ORIF) WRIST FRACTURE;  Surgeon: Shari Easter, MD;  Location: [facility] OR;  Service: Orthopedics;  Laterality: Left;  regional with iv sedation   SPLENECTOMY, TOTAL  1954   due to spherocytosis   TOTAL LAPAROSCOPIC HYSTERECTOMY WITH SALPINGECTOMY N/A 10/18/2019   Procedure: TOTAL LAPAROSCOPIC HYSTERECTOMY WITH BILATERAL SALPINGECTOMY AND BILATERAL OOPHORECTOMY;  Surgeon: Cleotilde Ronal RAMAN, MD;  Location: [facility];  Service: Gynecology;  Laterality: N/A;  BILATERAL SALPINGECTOMY POSS OOPHORECTOMY   TUBAL LIGATION Bilateral 1995   Patient Active Problem List   Diagnosis Date Noted   B12 deficiency 11/25/2023   Internal hemorrhoids 11/25/2023   Anemia 11/25/2023   History of CVA (cerebrovascular accident) 11/10/2023    Cognitive impairment 10/29/2023   At risk for falls 10/29/2023   Right patella fracture 10/19/2023   Carotid artery disease 08/25/2023   Essential (primary) hypertension 08/13/2023   ICAO (internal carotid artery occlusion), right 08/12/2023   PSVT (paroxysmal supraventricular tachycardia) 08/11/2023   Residual cognitive deficit as late effect of cerebrovascular accident 08/09/2023   Lower back pain 01/02/2023   Vitamin D  deficiency 11/24/2022   Aortic atherosclerosis 11/04/2019   HLD (hyperlipidemia) 11/03/2019   CAD (coronary artery disease) 11/03/2019   S/P total hysterectomy and bilateral salpingo-oophorectomy, 2021 11/03/2019   Hearing loss 10/13/2017   History of basal cell carcinoma (BCC) 05/19/2017   Anxiety 10/10/2016   Eczema 10/10/2016   H/O splenectomy for hemolytic spherocytosis 10/10/2016   H/O basal cell carcinoma excision 10/10/2016    PCP: Glade Hope MD  REFERRING PROVIDER: Lorilee Sven SQUIBB, MD   REFERRING DIAG:  M81.0 (ICD-10-CM) - Osteoporosis, unspecified osteoporosis type, unspecified pathological fracture presence  I63.9 (ICD-10-CM) - Cerebrovascular accident (CVA), unspecified mechanism (HCC)  I63.511 (ICD-10-CM) - Right middle cerebral artery stroke (HCC)    Rationale for Evaluation and Treatment: Rehabilitation  THERAPY DIAG:  Muscle weakness Osteoporosis without current pathological fx Other abnormalities of gait and mobility  ONSET DATE: chronic  SUBJECTIVE:                                                                                                                                                                                           SUBJECTIVE STATEMENT: My dtr bought me a treadmill and a squat machine yesterday.  Haven't used it yet. I am getting out of chairs better     Initial Subjective Done with rehab for my knee.  Want to increase my core strength and cardio. I walk around the block and ride stationary bike  a few times a  week for about 15 mins. No pain.  PERTINENT HISTORY:  Right patella fx with ORIF 8/25 L wrist fx with ORIF 1/24 L ankle fx 2017  PAIN:  Are you having pain? No 0/10 currently   PRECAUTIONS: Fall  RED FLAGS: None   WEIGHT BEARING RESTRICTIONS: No  FALLS:  Has patient fallen in last 6 months? No  OCCUPATION: retired  PLOF: Independent  PATIENT GOALS: increase core strength, improve bone health  NEXT MD VISIT: as needed  OBJECTIVE:  Note: Objective measures were completed at Evaluation unless otherwise noted.  DIAGNOSTIC FINDINGS:  10/25 MRI lumbar spine (without) demonstrating: - Rotoscoliosis convex right centered at L2.  Mild spondylosis and disc bulging as above.   - No spinal stenosis or foraminal narrowing  MRI thoracic spine (without) demonstrating: - Mild scoliosis, degenerative spondylosis and disc bulging as above.  No spinal stenosis or foraminal narrowing.  PATIENT SURVEYS:  ABC:123/16=76% confident  COGNITION: Overall cognitive status: Within functional limits for tasks assessed       POSTURE: Rotoscoliosis convex right centered at L2    LUMBAR ROM:   WFL  LOWER EXTREMITY ROM:     WFL  LOWER EXTREMITY MMT:    MMT Right eval Left eval  Hip flexion 27.6 27.8  Hip extension    Hip abduction 19.5 23.2  Hip adduction    Hip internal rotation    Hip external rotation    Knee flexion    Knee extension 39.4 47.6  Ankle dorsiflexion    Ankle plantarflexion    Ankle inversion    Ankle eversion     (Blank rows = not tested)   FUNCTIONAL TESTS:  Berg: 44/56 5 x STS: 26.02 uncontrolled sit TUG: 10.87   GAIT: Distance walked: 500 ft Assistive device utilized: None Level of assistance: Complete Independence Comments: small BOS  TREATMENT  [facility] Adult PT Treatment:                                                  DATE:  03/09/2024 Pt seen for aquatic therapy today.  Treatment took place in water 3.5-4.75 ft in depth at the  [facility] pool. Temp of water was 91.  Pt entered/exited the pool via stairs using alternating step pattern with hand rail.  *walking forward, back and side stepping in 3.6-4.0 ft unsupported *Side stepping-> ue add/abd yellow yellow *hip hinges 2 x 10.  VC and demonstration. 2 x10 good execution. *Hip hiking on bottom step R/L x 10 with 5s hold *STS from from water bench onto step x 10 with slow decent. *resisted hip extension and abduction using rider band holding @3 -> 4MG  (resistance) *seated resisted hip abd using rider band x10 *decompression position with noodle wrapped  posteriorly across chest: cycling  Pt requires the buoyancy and hydrostatic pressure of water for support, and to offload joints by unweighting joint load by at least 50 % in navel deep water and by at least 75-80% in chest to neck deep water.  Viscosity of the water is needed for resistance of strengthening. Water current perturbations provides challenge to standing balance requiring increased core activation.    03/01/24  Nustep L4x6 minutes BLEs only  Bridges + ABD into red TB x12 Sidelying clams red TB x12 B STS red TB around knees x10 Hip hikes x12 B Encouraged regular wt bearing exercise like a walking program, also educated on benefit  of resistance training to help build bone mass and counter osteoporosis/penia   Tandem stance in corner 4x30 seconds alternating Tandem walking at bar minA  Wide tandem on blue foam 4x30 seconds alternating     02/22/24 Pt seen for aquatic therapy today.  Treatment took place in water 3.5-4.75 ft in depth at the [facility] pool. Temp of water was 91.  Pt entered/exited the pool via stairs using alternating step pattern with hand rail.  *walking forward, back and side stepping in 3.6-4.0 ft unsupported *Side stepping-> ue add/abd RBHB-> horizontal add/abd *suitcase carry using RBHB bilaterally then unilaterally marching forward and backward *Step ups  bottom step leading R/L ue supported->unsupported x 10 *stair tapping 1st step then 2nd step alternating x 10   *resisted hip abd in seated position using rider band 2 x 10 *resisted side stepping using rider band R/L *traditional squats with hip isometric abd using rider band *1/2 noodle pull down for TrA engagement wide stance than staggered *decompression position with noodle wrapped  posteriorly across chest: cycling  Pt requires the buoyancy and hydrostatic pressure of water for support, and to offload joints by unweighting joint load by at least 50 % in navel deep water and by at least 75-80% in chest to neck deep water.  Viscosity of the water is needed for resistance of strengthening. Water current perturbations provides challenge to standing balance requiring increased core activation.   DATE: 02/17/24  -Nu-step L4 x26min -Sit to stands with green band around thighs  -HR/TR 3x10 -partial squats at rail x10 -standing hip abduction /extension 2# 2x10ea -standing marching 2# 2x10 -standing HSC 2# 2x10ea -LAQ 4# 5 hold 2x10ea -s/l hip abduction 2x10ea                                                                                                                                        PATIENT EDUCATION:  Education details: Discussed eval findings, rehab rationale, aquatic program progression/POC and pools in area. Patient is in agreement  Person educated: Patient Education method: Explanation Education comprehension: verbalized understanding  HOME EXERCISE PROGRAM: Pt following HEP assigned by land therapy at other clinic (recently) DC for rehab patella fx Aquatic tba  Prior HEP from other PT for patella- quad sets, SLRs, SAQs, standing hip ABD, standing marches, standing hip extensions, added below:   Access Code: [email or web address] URL: [email or web address] Date: 02/26/2024 Prepared by: Josette Rough  ASSESSMENT:  CLINICAL IMPRESSION: Pt reports  adapting many of the aquatic exercises completed onto land at home. Feels it has helped and that she is gaining strength. Reviewed safety with some positions that the viscosity and buoyancy of the water helps to maintain. Continued to progress resisted exercises as able submerged with good toleration.  She demonstrates STS transfer without difficulty rising from water bench onto water step.  Planning on seeing pt 2 more times in aquatics and will have HEP completed and  instructed by end of month. She continues to be seen on land for progressive loading for improvement in bone health as well as strengthening and balance. Good compliance with HEP.           Initial Impression Patient is a 74 y.o. f who was seen today for physical therapy evaluation and treatment for OP with past related pathological fx. She has just completed an episode of PT at another clinic s/p Right patella fx with ORIF 8/25. Pt reports at baseline she is not very active but is trying to increase her activity at MD suggestion to improve bone health as since 2017 she has had 3 pathological fxs.  She has not had a recent bone density test completed.  Testing today demonstrates reduced strength in core and hips.  Requires UE assist to rise from chairs.  She is doing some low level exercise a few times a week but as she reports not getting her heart rate up. She will benefit from skilled PT to improve all areas and deficit and instruct on HEP to improve bone health. Plan to see her in aquatics a few times for instruction on water exercise although majority of sessions to be land based for loading to progress bone health.  OBJECTIVE IMPAIRMENTS: decreased activity tolerance and lack of motivation.   ACTIVITY LIMITATIONS: locomotion level  PERSONAL FACTORS: Behavior pattern are also affecting patient's functional outcome.   REHAB POTENTIAL: Good  CLINICAL DECISION MAKING: Stable/uncomplicated  EVALUATION COMPLEXITY:  Low   GOALS: Goals reviewed with patient? Yes  SHORT TERM GOALS: Target date: 03/05/24  Pt will tolerate full aquatic sessions consistently without increase in pain and with improving function to demonstrate good toleration and effectiveness of intervention.  Baseline: Goal status: Met 03/02/24  2.  Pt to report compliance with daily exercise of walking or using station bike/elliptical (at home) Baseline:  Goal status: Met 03/02/24    LONG TERM GOALS: Target date: 03/25/24  Pt will improve on her ABC score above 80% to demonstrate a High level of physical function Baseline: 123/16=76% confident Goal status: INITIAL  2.  Pt will be indep and compliant with final HEP's (land and aquatic as appropriate) for continued management of condition Baseline:  Goal status: INITIAL  3.  Pt will improve strength in hips by at least 5 lbs to demonstrate improved overall physical function Baseline:  Goal status: INITIAL  4.  Pt will improve on 5 X STS test to <or= 21s to demonstrate improving functional lower extremity strength, transitional movements, and balance. (MDC = 4.2sec)   Baseline: 26.02 with uncontrolled sit Goal status: INITIAL    PLAN:  PT FREQUENCY: 2x/week  PT DURATION: 6 weeks  PLANNED INTERVENTIONS: [id number]- PT Re-evaluation, 97750- Physical Performance Testing, 97110-Therapeutic exercises, 97530- Therapeutic activity, [id number]- Neuromuscular re-education, 97535- Self Care, [id number]- Manual therapy, [id number]- Gait training, [phone]- Orthotic Initial, [phone]- Aquatic Therapy, [phone]- Electrical stimulation (unattended), [phone]- Electrical stimulation (manual), [id number]- Ionotophoresis 4mg /ml Dexamethasone , 79439 (1-2 muscles), 20561 (3+ muscles)- Dry Needling, Patient/Family education, Balance training, Stair training, Taping, Joint mobilization, DME instructions, Cryotherapy, and Moist heat.  PLAN FOR NEXT SESSION: aquatics instruct on hep for le and core strengthening; balance  retraining Land: loaded as tolerated for general strengthening and to encourage improve bone health; HEP updates PRN    Ronal Foots) Wilver Tignor MPT 03/09/2024 2:04 PM [facility] [address] [place], [place], [address] Phone: [phone]   Fax:  [phone]      "

## 2024-03-11 ENCOUNTER — Ambulatory Visit (HOSPITAL_BASED_OUTPATIENT_CLINIC_OR_DEPARTMENT_OTHER): Admitting: Physical Therapy

## 2024-03-11 ENCOUNTER — Encounter (HOSPITAL_BASED_OUTPATIENT_CLINIC_OR_DEPARTMENT_OTHER): Payer: Self-pay | Admitting: Physical Therapy

## 2024-03-11 DIAGNOSIS — M6281 Muscle weakness (generalized): Secondary | ICD-10-CM

## 2024-03-11 DIAGNOSIS — R2689 Other abnormalities of gait and mobility: Secondary | ICD-10-CM

## 2024-03-11 DIAGNOSIS — M810 Age-related osteoporosis without current pathological fracture: Secondary | ICD-10-CM

## 2024-03-11 NOTE — Therapy (Signed)
 " OUTPATIENT PHYSICAL THERAPY THORACOLUMBAR TREATMENT   Patient Name: Alicia Wilkerson MRN: [id number] DOB:1950/07/01, 74 y.o., female Today's Date: 02/07/2024  END OF SESSION:   PT End of Session - 03/11/24 1433     Visit Number 8    Date for Recertification  03/25/24    Authorization Type aetna medicare    Progress Note Due on Visit 10    PT Start Time 1433    PT Stop Time 1518    PT Time Calculation (min) 45 min    Activity Tolerance Patient tolerated treatment well    Behavior During Therapy [facility] for tasks assessed/performed                 Past Medical History:  Diagnosis Date   Anemia    Anxiety disorder    Basal cell carcinoma (BCC) of left upper arm 12/2015   Basal cell carcinoma (BCC) of right lower leg 04/2016   Borderline hypertension    CAD (coronary artery disease)    a. nonobst by cor CT 12/2018.   CIN II (cervical intraepithelial neoplasia II)    CIN  1   Depression    Eczema    Family history of malignant neoplasm of gastrointestinal tract 08/14/2020   Family history of premature CAD    Hard of hearing    wears hearing aids   History of basal cell carcinoma (BCC) excision    left upper arm 2017;   right lower leg 2018;  bilateral lower leg and hip area 2019;   inner right lower leg, outer right thigh, & upper outside left arm 09/ 2020   History of cervical dysplasia 2013   History of hereditary spherocytosis    s/p splenectomy in 1954   History of hyperparathyroidism    per pt yrs ago was put on mega dose of vit d which caused the hyperparathyroid resolved after stopping vit d   History of pelvic fracture 2013   Ischemic stroke (HCC) 08/07/2023   Left inguinal hernia 01/02/2023   MAI (mycobacterium avium-intracellulare) (HCC)    ? possibly by CT 12/2018   Mild hyperlipidemia    Osteoporosis    PONV (postoperative nausea and vomiting)    S/P hernia repair 03/02/2023   S/P splenectomy    spherocytosis   Wears glasses    White coat syndrome  without diagnosis of hypertension    Past Surgical History:  Procedure Laterality Date   ANKLE SURGERY  03/14/2015   BREAST BIOPSY Left 08/30/2021   CYSTIC APOCRINE METAPLASIA   CERVICAL CONIZATION W/BX N/A 12/13/2018   Procedure: CONIZATION CERVIX WITH BIOPSY;  Surgeon: Cleotilde Ronal RAMAN, MD;  Location: [facility] OR;  Service: Gynecology;  Laterality: N/A;   CYSTOSCOPY N/A 10/18/2019   Procedure: CYSTOSCOPY;  Surgeon: Cleotilde Ronal RAMAN, MD;  Location: [facility];  Service: Gynecology;  Laterality: N/A;   IR RADIOLOGY PERIPHERAL GUIDED IV START  01/05/2019   IR US  GUIDE VASC ACCESS RIGHT  01/05/2019   KNEE SURGERY Left 1991   per pt retained hardward   LEEP N/A 12/13/2018   Procedure: possible LOOP ELECTROSURGICAL EXCISION PROCEDURE (LEEP);  Surgeon: Cleotilde Ronal RAMAN, MD;  Location: [facility] OR;  Service: Gynecology;  Laterality: N/A;   ORIF ANKLE FRACTURE Left 03/14/2015   per pt retained hardware   ORIF PATELLA Right 10/19/2023   Procedure: OPEN REDUCTION INTERNAL FIXATION (ORIF) PATELLA;  Surgeon: Kay Kemps, MD;  Location: [facility] ORS;  Service: Orthopedics;  Laterality: Right;   ORIF WRIST  FRACTURE Left 03/10/2022   Procedure: OPEN REDUCTION INTERNAL FIXATION (ORIF) WRIST FRACTURE;  Surgeon: Shari Easter, MD;  Location: [facility] OR;  Service: Orthopedics;  Laterality: Left;  regional with iv sedation   SPLENECTOMY, TOTAL  1954   due to spherocytosis   TOTAL LAPAROSCOPIC HYSTERECTOMY WITH SALPINGECTOMY N/A 10/18/2019   Procedure: TOTAL LAPAROSCOPIC HYSTERECTOMY WITH BILATERAL SALPINGECTOMY AND BILATERAL OOPHORECTOMY;  Surgeon: Cleotilde Ronal RAMAN, MD;  Location: [facility];  Service: Gynecology;  Laterality: N/A;  BILATERAL SALPINGECTOMY POSS OOPHORECTOMY   TUBAL LIGATION Bilateral 1995   Patient Active Problem List   Diagnosis Date Noted   B12 deficiency 11/25/2023   Internal hemorrhoids 11/25/2023   Anemia 11/25/2023   History of CVA (cerebrovascular accident) 11/10/2023    Cognitive impairment 10/29/2023   At risk for falls 10/29/2023   Right patella fracture 10/19/2023   Carotid artery disease 08/25/2023   Essential (primary) hypertension 08/13/2023   ICAO (internal carotid artery occlusion), right 08/12/2023   PSVT (paroxysmal supraventricular tachycardia) 08/11/2023   Residual cognitive deficit as late effect of cerebrovascular accident 08/09/2023   Lower back pain 01/02/2023   Vitamin D  deficiency 11/24/2022   Aortic atherosclerosis 11/04/2019   HLD (hyperlipidemia) 11/03/2019   CAD (coronary artery disease) 11/03/2019   S/P total hysterectomy and bilateral salpingo-oophorectomy, 2021 11/03/2019   Hearing loss 10/13/2017   History of basal cell carcinoma (BCC) 05/19/2017   Anxiety 10/10/2016   Eczema 10/10/2016   H/O splenectomy for hemolytic spherocytosis 10/10/2016   H/O basal cell carcinoma excision 10/10/2016    PCP: Glade Hope MD  REFERRING PROVIDER: Lorilee Sven SQUIBB, MD   REFERRING DIAG:  M81.0 (ICD-10-CM) - Osteoporosis, unspecified osteoporosis type, unspecified pathological fracture presence  I63.9 (ICD-10-CM) - Cerebrovascular accident (CVA), unspecified mechanism (HCC)  I63.511 (ICD-10-CM) - Right middle cerebral artery stroke (HCC)    Rationale for Evaluation and Treatment: Rehabilitation  THERAPY DIAG:  Muscle weakness Osteoporosis without current pathological fx Other abnormalities of gait and mobility  ONSET DATE: chronic  SUBJECTIVE:                                                                                                                                                                                           SUBJECTIVE STATEMENT: Patient states she feels like she is getting better.      Initial Subjective Done with rehab for my knee.  Want to increase my core strength and cardio. I walk around the block and ride stationary bike  a few times a week for about 15 mins. No pain.  PERTINENT HISTORY:  Right  patella fx with ORIF 8/25 L wrist fx with  ORIF 1/24 L ankle fx 2017  PAIN:  Are you having pain? No 0/10 currently   PRECAUTIONS: Fall  RED FLAGS: None   WEIGHT BEARING RESTRICTIONS: No  FALLS:  Has patient fallen in last 6 months? No  OCCUPATION: retired  PLOF: Independent  PATIENT GOALS: increase core strength, improve bone health  NEXT MD VISIT: as needed  OBJECTIVE:  Note: Objective measures were completed at Evaluation unless otherwise noted.  DIAGNOSTIC FINDINGS:  10/25 MRI lumbar spine (without) demonstrating: - Rotoscoliosis convex right centered at L2.  Mild spondylosis and disc bulging as above.   - No spinal stenosis or foraminal narrowing  MRI thoracic spine (without) demonstrating: - Mild scoliosis, degenerative spondylosis and disc bulging as above.  No spinal stenosis or foraminal narrowing.  PATIENT SURVEYS:  ABC:123/16=76% confident  COGNITION: Overall cognitive status: Within functional limits for tasks assessed       POSTURE: Rotoscoliosis convex right centered at L2    LUMBAR ROM:   WFL  LOWER EXTREMITY ROM:     WFL  LOWER EXTREMITY MMT:    MMT Right eval Left eval  Hip flexion 27.6 27.8  Hip extension    Hip abduction 19.5 23.2  Hip adduction    Hip internal rotation    Hip external rotation    Knee flexion    Knee extension 39.4 47.6  Ankle dorsiflexion    Ankle plantarflexion    Ankle inversion    Ankle eversion     (Blank rows = not tested)   FUNCTIONAL TESTS:  Berg: 44/56 5 x STS: 26.02 uncontrolled sit TUG: 10.87   GAIT: Distance walked: 500 ft Assistive device utilized: None Level of assistance: Complete Independence Comments: small BOS  TREATMENT  [facility] Adult PT Treatment:                                                 03/11/24 Squat at rail 2 x 10 Step up 6 inch 2 x 10 Lateral step up and over 6 inch 2 x 10 Standing hip abduction RTB at knees 2 x 10 Dead lift 1 x 10   DATE:  2024/03/30 Pt  seen for aquatic therapy today.  Treatment took place in water 3.5-4.75 ft in depth at the [facility] pool. Temp of water was 91.  Pt entered/exited the pool via stairs using alternating step pattern with hand rail.  *walking forward, back and side stepping in 3.6-4.0 ft unsupported *Side stepping-> ue add/abd yellow yellow *hip hinges 2 x 10.  VC and demonstration. 2 x10 good execution. *Hip hiking on bottom step R/L x 10 with 5s hold *STS from from water bench onto step x 10 with slow decent. *resisted hip extension and abduction using rider band holding @3 -> 4MG  (resistance) *seated resisted hip abd using rider band x10 *decompression position with noodle wrapped  posteriorly across chest: cycling  Pt requires the buoyancy and hydrostatic pressure of water for support, and to offload joints by unweighting joint load by at least 50 % in navel deep water and by at least 75-80% in chest to neck deep water.  Viscosity of the water is needed for resistance of strengthening. Water current perturbations provides challenge to standing balance requiring increased core activation.    03/01/24  Nustep L4x6 minutes BLEs only  Bridges + ABD into red TB x12 Sidelying clams red  TB x12 B STS red TB around knees x10 Hip hikes x12 B Encouraged regular wt bearing exercise like a walking program, also educated on benefit of resistance training to help build bone mass and counter osteoporosis/penia   Tandem stance in corner 4x30 seconds alternating Tandem walking at bar minA  Wide tandem on blue foam 4x30 seconds alternating     02/22/24 Pt seen for aquatic therapy today.  Treatment took place in water 3.5-4.75 ft in depth at the [facility] pool. Temp of water was 91.  Pt entered/exited the pool via stairs using alternating step pattern with hand rail.  *walking forward, back and side stepping in 3.6-4.0 ft unsupported *Side stepping-> ue add/abd RBHB-> horizontal  add/abd *suitcase carry using RBHB bilaterally then unilaterally marching forward and backward *Step ups bottom step leading R/L ue supported->unsupported x 10 *stair tapping 1st step then 2nd step alternating x 10   *resisted hip abd in seated position using rider band 2 x 10 *resisted side stepping using rider band R/L *traditional squats with hip isometric abd using rider band *1/2 noodle pull down for TrA engagement wide stance than staggered *decompression position with noodle wrapped  posteriorly across chest: cycling  Pt requires the buoyancy and hydrostatic pressure of water for support, and to offload joints by unweighting joint load by at least 50 % in navel deep water and by at least 75-80% in chest to neck deep water.  Viscosity of the water is needed for resistance of strengthening. Water current perturbations provides challenge to standing balance requiring increased core activation.   DATE: 02/17/24  -Nu-step L4 x54min -Sit to stands with green band around thighs  -HR/TR 3x10 -partial squats at rail x10 -standing hip abduction /extension 2# 2x10ea -standing marching 2# 2x10 -standing HSC 2# 2x10ea -LAQ 4# 5 hold 2x10ea -s/l hip abduction 2x10ea                                                                                                                                        PATIENT EDUCATION:  Education details: Discussed eval findings, rehab rationale, aquatic program progression/POC and pools in area. Patient is in agreement  Person educated: Patient Education method: Explanation Education comprehension: verbalized understanding  HOME EXERCISE PROGRAM: Pt following HEP assigned by land therapy at other clinic (recently) DC for rehab patella fx Aquatic tba  Prior HEP from other PT for patella- quad sets, SLRs, SAQs, standing hip ABD, standing marches, standing hip extensions, added below:   Access Code: [email or web address] URL:  [email or web address] Date: 02/26/2024 Prepared by: Josette Rough  ASSESSMENT:  CLINICAL IMPRESSION: Continued with functional strengthening which is tolerated well. Cueing provided throughout session for reduction of valgus with good/fair carry over. Moderate fatigue at EOS. Weight shift with dead lift but overall good hip hinge mechanics, will need to revisit this in upcoming session. Patient will continue to benefit from physical therapy in order to  improve function and reduce impairment.   Initial Impression Patient is a 74 y.o. f who was seen today for physical therapy evaluation and treatment for OP with past related pathological fx. She has just completed an episode of PT at another clinic s/p Right patella fx with ORIF 8/25. Pt reports at baseline she is not very active but is trying to increase her activity at MD suggestion to improve bone health as since 2017 she has had 3 pathological fxs.  She has not had a recent bone density test completed.  Testing today demonstrates reduced strength in core and hips.  Requires UE assist to rise from chairs.  She is doing some low level exercise a few times a week but as she reports not getting her heart rate up. She will benefit from skilled PT to improve all areas and deficit and instruct on HEP to improve bone health. Plan to see her in aquatics a few times for instruction on water exercise although majority of sessions to be land based for loading to progress bone health.  OBJECTIVE IMPAIRMENTS: decreased activity tolerance and lack of motivation.   ACTIVITY LIMITATIONS: locomotion level  PERSONAL FACTORS: Behavior pattern are also affecting patient's functional outcome.   REHAB POTENTIAL: Good  CLINICAL DECISION MAKING: Stable/uncomplicated  EVALUATION COMPLEXITY: Low   GOALS: Goals reviewed with patient? Yes  SHORT TERM GOALS: Target date: 03/05/24  Pt will tolerate full aquatic sessions consistently without increase  in pain and with improving function to demonstrate good toleration and effectiveness of intervention.  Baseline: Goal status: Met 03/02/24  2.  Pt to report compliance with daily exercise of walking or using station bike/elliptical (at home) Baseline:  Goal status: Met 03/02/24    LONG TERM GOALS: Target date: 03/25/24  Pt will improve on her ABC score above 80% to demonstrate a High level of physical function Baseline: 123/16=76% confident Goal status: INITIAL  2.  Pt will be indep and compliant with final HEP's (land and aquatic as appropriate) for continued management of condition Baseline:  Goal status: INITIAL  3.  Pt will improve strength in hips by at least 5 lbs to demonstrate improved overall physical function Baseline:  Goal status: INITIAL  4.  Pt will improve on 5 X STS test to <or= 21s to demonstrate improving functional lower extremity strength, transitional movements, and balance. (MDC = 4.2sec)   Baseline: 26.02 with uncontrolled sit Goal status: INITIAL    PLAN:  PT FREQUENCY: 2x/week  PT DURATION: 6 weeks  PLANNED INTERVENTIONS: [id number]- PT Re-evaluation, 97750- Physical Performance Testing, 97110-Therapeutic exercises, 97530- Therapeutic activity, [id number]- Neuromuscular re-education, 97535- Self Care, [id number]- Manual therapy, [id number]- Gait training, [phone]- Orthotic Initial, [id number]- Aquatic Therapy, [phone]- Electrical stimulation (unattended), [phone]- Electrical stimulation (manual), [id number]- Ionotophoresis 4mg /ml Dexamethasone , 79439 (1-2 muscles), 20561 (3+ muscles)- Dry Needling, Patient/Family education, Balance training, Stair training, Taping, Joint mobilization, DME instructions, Cryotherapy, and Moist heat.  PLAN FOR NEXT SESSION: aquatics instruct on hep for le and core strengthening; balance retraining Land: loaded as tolerated for general strengthening and to encourage improve bone health; HEP updates PRN     Prentice GORMAN Stains, PT 03/11/2024, 3:20 PM     "

## 2024-03-15 ENCOUNTER — Encounter (HOSPITAL_BASED_OUTPATIENT_CLINIC_OR_DEPARTMENT_OTHER): Payer: Self-pay

## 2024-03-15 ENCOUNTER — Ambulatory Visit (HOSPITAL_BASED_OUTPATIENT_CLINIC_OR_DEPARTMENT_OTHER)

## 2024-03-15 DIAGNOSIS — R2689 Other abnormalities of gait and mobility: Secondary | ICD-10-CM | POA: Diagnosis not present

## 2024-03-15 DIAGNOSIS — M6281 Muscle weakness (generalized): Secondary | ICD-10-CM

## 2024-03-15 DIAGNOSIS — M810 Age-related osteoporosis without current pathological fracture: Secondary | ICD-10-CM

## 2024-03-15 NOTE — Therapy (Signed)
 " OUTPATIENT PHYSICAL THERAPY THORACOLUMBAR TREATMENT   Patient Name: Alicia Wilkerson MRN: [id number] DOB:Sep 15, 1950, 74 y.o., female Today's Date: 02/07/2024  END OF SESSION:   PT End of Session - 03/15/24 1526     Visit Number 9    Date for Recertification  03/25/24    Authorization Type aetna medicare    Progress Note Due on Visit 10    PT Start Time 1521    PT Stop Time 1559    PT Time Calculation (min) 38 min    Activity Tolerance Patient tolerated treatment well    Behavior During Therapy [facility] for tasks assessed/performed                  Past Medical History:  Diagnosis Date   Anemia    Anxiety disorder    Basal cell carcinoma (BCC) of left upper arm 12/2015   Basal cell carcinoma (BCC) of right lower leg 04/2016   Borderline hypertension    CAD (coronary artery disease)    a. nonobst by cor CT 12/2018.   CIN II (cervical intraepithelial neoplasia II)    CIN  1   Depression    Eczema    Family history of malignant neoplasm of gastrointestinal tract 08/14/2020   Family history of premature CAD    Hard of hearing    wears hearing aids   History of basal cell carcinoma (BCC) excision    left upper arm 2017;   right lower leg 2018;  bilateral lower leg and hip area 2019;   inner right lower leg, outer right thigh, & upper outside left arm 09/ 2020   History of cervical dysplasia 2013   History of hereditary spherocytosis    s/p splenectomy in 1954   History of hyperparathyroidism    per pt yrs ago was put on mega dose of vit d which caused the hyperparathyroid resolved after stopping vit d   History of pelvic fracture 2013   Ischemic stroke (HCC) 08/07/2023   Left inguinal hernia 01/02/2023   MAI (mycobacterium avium-intracellulare) (HCC)    ? possibly by CT 12/2018   Mild hyperlipidemia    Osteoporosis    PONV (postoperative nausea and vomiting)    S/P hernia repair 03/02/2023   S/P splenectomy    spherocytosis   Wears glasses    White coat  syndrome without diagnosis of hypertension    Past Surgical History:  Procedure Laterality Date   ANKLE SURGERY  03/14/2015   BREAST BIOPSY Left 08/30/2021   CYSTIC APOCRINE METAPLASIA   CERVICAL CONIZATION W/BX N/A 12/13/2018   Procedure: CONIZATION CERVIX WITH BIOPSY;  Surgeon: Cleotilde Ronal RAMAN, MD;  Location: [facility] OR;  Service: Gynecology;  Laterality: N/A;   CYSTOSCOPY N/A 10/18/2019   Procedure: CYSTOSCOPY;  Surgeon: Cleotilde Ronal RAMAN, MD;  Location: [facility];  Service: Gynecology;  Laterality: N/A;   IR RADIOLOGY PERIPHERAL GUIDED IV START  01/05/2019   IR US  GUIDE VASC ACCESS RIGHT  01/05/2019   KNEE SURGERY Left 1991   per pt retained hardward   LEEP N/A 12/13/2018   Procedure: possible LOOP ELECTROSURGICAL EXCISION PROCEDURE (LEEP);  Surgeon: Cleotilde Ronal RAMAN, MD;  Location: [facility] OR;  Service: Gynecology;  Laterality: N/A;   ORIF ANKLE FRACTURE Left 03/14/2015   per pt retained hardware   ORIF PATELLA Right 10/19/2023   Procedure: OPEN REDUCTION INTERNAL FIXATION (ORIF) PATELLA;  Surgeon: Kay Kemps, MD;  Location: [facility] ORS;  Service: Orthopedics;  Laterality: Right;   ORIF  WRIST FRACTURE Left 03/10/2022   Procedure: OPEN REDUCTION INTERNAL FIXATION (ORIF) WRIST FRACTURE;  Surgeon: Shari Easter, MD;  Location: [facility] OR;  Service: Orthopedics;  Laterality: Left;  regional with iv sedation   SPLENECTOMY, TOTAL  1954   due to spherocytosis   TOTAL LAPAROSCOPIC HYSTERECTOMY WITH SALPINGECTOMY N/A 10/18/2019   Procedure: TOTAL LAPAROSCOPIC HYSTERECTOMY WITH BILATERAL SALPINGECTOMY AND BILATERAL OOPHORECTOMY;  Surgeon: Cleotilde Ronal RAMAN, MD;  Location: [facility];  Service: Gynecology;  Laterality: N/A;  BILATERAL SALPINGECTOMY POSS OOPHORECTOMY   TUBAL LIGATION Bilateral 1995   Patient Active Problem List   Diagnosis Date Noted   B12 deficiency 11/25/2023   Internal hemorrhoids 11/25/2023   Anemia 11/25/2023   History of CVA (cerebrovascular accident)  11/10/2023   Cognitive impairment 10/29/2023   At risk for falls 10/29/2023   Right patella fracture 10/19/2023   Carotid artery disease 08/25/2023   Essential (primary) hypertension 08/13/2023   ICAO (internal carotid artery occlusion), right 08/12/2023   PSVT (paroxysmal supraventricular tachycardia) 08/11/2023   Residual cognitive deficit as late effect of cerebrovascular accident 08/09/2023   Lower back pain 01/02/2023   Vitamin D  deficiency 11/24/2022   Aortic atherosclerosis 11/04/2019   HLD (hyperlipidemia) 11/03/2019   CAD (coronary artery disease) 11/03/2019   S/P total hysterectomy and bilateral salpingo-oophorectomy, 2021 11/03/2019   Hearing loss 10/13/2017   History of basal cell carcinoma (BCC) 05/19/2017   Anxiety 10/10/2016   Eczema 10/10/2016   H/O splenectomy for hemolytic spherocytosis 10/10/2016   H/O basal cell carcinoma excision 10/10/2016    PCP: Glade Hope MD  REFERRING PROVIDER: Lorilee Sven SQUIBB, MD   REFERRING DIAG:  M81.0 (ICD-10-CM) - Osteoporosis, unspecified osteoporosis type, unspecified pathological fracture presence  I63.9 (ICD-10-CM) - Cerebrovascular accident (CVA), unspecified mechanism (HCC)  I63.511 (ICD-10-CM) - Right middle cerebral artery stroke (HCC)    Rationale for Evaluation and Treatment: Rehabilitation  THERAPY DIAG:  Muscle weakness Osteoporosis without current pathological fx Other abnormalities of gait and mobility  ONSET DATE: chronic  SUBJECTIVE:                                                                                                                                                                                           SUBJECTIVE STATEMENT: Patient states she feels like she is getting better.      Initial Subjective Done with rehab for my knee.  Want to increase my core strength and cardio. I walk around the block and ride stationary bike  a few times a week for about 15 mins. No pain.  PERTINENT  HISTORY:  Right patella fx with ORIF 8/25 L wrist fx  with ORIF 1/24 L ankle fx 2017  PAIN:  Are you having pain? No 0/10 currently   PRECAUTIONS: Fall  RED FLAGS: None   WEIGHT BEARING RESTRICTIONS: No  FALLS:  Has patient fallen in last 6 months? No  OCCUPATION: retired  PLOF: Independent  PATIENT GOALS: increase core strength, improve bone health  NEXT MD VISIT: as needed  OBJECTIVE:  Note: Objective measures were completed at Evaluation unless otherwise noted.  DIAGNOSTIC FINDINGS:  10/25 MRI lumbar spine (without) demonstrating: - Rotoscoliosis convex right centered at L2.  Mild spondylosis and disc bulging as above.   - No spinal stenosis or foraminal narrowing  MRI thoracic spine (without) demonstrating: - Mild scoliosis, degenerative spondylosis and disc bulging as above.  No spinal stenosis or foraminal narrowing.  PATIENT SURVEYS:  ABC:123/16=76% confident  COGNITION: Overall cognitive status: Within functional limits for tasks assessed       POSTURE: Rotoscoliosis convex right centered at L2    LUMBAR ROM:   WFL  LOWER EXTREMITY ROM:     WFL  LOWER EXTREMITY MMT:    MMT Right eval Left eval  Hip flexion 27.6 27.8  Hip extension    Hip abduction 19.5 23.2  Hip adduction    Hip internal rotation    Hip external rotation    Knee flexion    Knee extension 39.4 47.6  Ankle dorsiflexion    Ankle plantarflexion    Ankle inversion    Ankle eversion     (Blank rows = not tested)   FUNCTIONAL TESTS:  Berg: 44/56 5 x STS: 26.02 uncontrolled sit TUG: 10.87   GAIT: Distance walked: 500 ft Assistive device utilized: None Level of assistance: Complete Independence Comments: small BOS  TREATMENT  [facility] Adult PT Treatment:                                                  1/20: STS 2x10 Step ups 6 2x10bil Deadlift x10, added 10lb KB 2x10 Standing hip abduction RTB at ankles 2x10 Standing hip extension RTB at ankles 2x10 Leg  press cybex 50# 2x10 Leg extension machine 10# 2x10 Tandem balance x20sec each   03/11/24 Squat at rail 2 x 10 Step up 6 inch 2 x 10 Lateral step up and over 6 inch 2 x 10 Standing hip abduction RTB at knees 2 x 10 Dead lift 1 x 10   DATE:  March 15, 2024 Pt seen for aquatic therapy today.  Treatment took place in water 3.5-4.75 ft in depth at the [facility] pool. Temp of water was 91.  Pt entered/exited the pool via stairs using alternating step pattern with hand rail.  *walking forward, back and side stepping in 3.6-4.0 ft unsupported *Side stepping-> ue add/abd yellow yellow *hip hinges 2 x 10.  VC and demonstration. 2 x10 good execution. *Hip hiking on bottom step R/L x 10 with 5s hold *STS from from water bench onto step x 10 with slow decent. *resisted hip extension and abduction using rider band holding @3 -> 4MG  (resistance) *seated resisted hip abd using rider band x10 *decompression position with noodle wrapped  posteriorly across chest: cycling  Pt requires the buoyancy and hydrostatic pressure of water for support, and to offload joints by unweighting joint load by at least 50 % in navel deep water and by at least 75-80% in chest to neck deep  water.  Viscosity of the water is needed for resistance of strengthening. Water current perturbations provides challenge to standing balance requiring increased core activation.    03/01/24  Nustep L4x6 minutes BLEs only  Bridges + ABD into red TB x12 Sidelying clams red TB x12 B STS red TB around knees x10 Hip hikes x12 B Encouraged regular wt bearing exercise like a walking program, also educated on benefit of resistance training to help build bone mass and counter osteoporosis/penia   Tandem stance in corner 4x30 seconds alternating Tandem walking at bar minA  Wide tandem on blue foam 4x30 seconds alternating     02/22/24 Pt seen for aquatic therapy today.  Treatment took place in water 3.5-4.75 ft in depth at the  [facility] pool. Temp of water was 91.  Pt entered/exited the pool via stairs using alternating step pattern with hand rail.  *walking forward, back and side stepping in 3.6-4.0 ft unsupported *Side stepping-> ue add/abd RBHB-> horizontal add/abd *suitcase carry using RBHB bilaterally then unilaterally marching forward and backward *Step ups bottom step leading R/L ue supported->unsupported x 10 *stair tapping 1st step then 2nd step alternating x 10   *resisted hip abd in seated position using rider band 2 x 10 *resisted side stepping using rider band R/L *traditional squats with hip isometric abd using rider band *1/2 noodle pull down for TrA engagement wide stance than staggered *decompression position with noodle wrapped  posteriorly across chest: cycling  Pt requires the buoyancy and hydrostatic pressure of water for support, and to offload joints by unweighting joint load by at least 50 % in navel deep water and by at least 75-80% in chest to neck deep water.  Viscosity of the water is needed for resistance of strengthening. Water current perturbations provides challenge to standing balance requiring increased core activation.   DATE: 02/17/24  -Nu-step L4 x64min -Sit to stands with green band around thighs  -HR/TR 3x10 -partial squats at rail x10 -standing hip abduction /extension 2# 2x10ea -standing marching 2# 2x10 -standing HSC 2# 2x10ea -LAQ 4# 5 hold 2x10ea -s/l hip abduction 2x10ea                                                                                                                                        PATIENT EDUCATION:  Education details: Discussed eval findings, rehab rationale, aquatic program progression/POC and pools in area. Patient is in agreement  Person educated: Patient Education method: Explanation Education comprehension: verbalized understanding  HOME EXERCISE PROGRAM: Pt following HEP assigned by land therapy at other  clinic (recently) DC for rehab patella fx Aquatic tba  Prior HEP from other PT for patella- quad sets, SLRs, SAQs, standing hip ABD, standing marches, standing hip extensions, added below:   Access Code: [email or web address] URL: [email or web address] Date: 02/26/2024 Prepared by: Josette Rough  ASSESSMENT:  CLINICAL IMPRESSION: Good tolerance for progressions to strengthening program today. She  had improved form with dead lifts today and was able to load with 10lb KB. Trialed machine strengthening with good tolerance for this, though LE fatigue reported. She denied any increase in pain throughout session. Cuing provided prn for form.    Initial Impression Patient is a 74 y.o. f who was seen today for physical therapy evaluation and treatment for OP with past related pathological fx. She has just completed an episode of PT at another clinic s/p Right patella fx with ORIF 8/25. Pt reports at baseline she is not very active but is trying to increase her activity at MD suggestion to improve bone health as since 2017 she has had 3 pathological fxs.  She has not had a recent bone density test completed.  Testing today demonstrates reduced strength in core and hips.  Requires UE assist to rise from chairs.  She is doing some low level exercise a few times a week but as she reports not getting her heart rate up. She will benefit from skilled PT to improve all areas and deficit and instruct on HEP to improve bone health. Plan to see her in aquatics a few times for instruction on water exercise although majority of sessions to be land based for loading to progress bone health.  OBJECTIVE IMPAIRMENTS: decreased activity tolerance and lack of motivation.   ACTIVITY LIMITATIONS: locomotion level  PERSONAL FACTORS: Behavior pattern are also affecting patient's functional outcome.   REHAB POTENTIAL: Good  CLINICAL DECISION MAKING: Stable/uncomplicated  EVALUATION COMPLEXITY: Low   GOALS: Goals  reviewed with patient? Yes  SHORT TERM GOALS: Target date: 03/05/24  Pt will tolerate full aquatic sessions consistently without increase in pain and with improving function to demonstrate good toleration and effectiveness of intervention.  Baseline: Goal status: Met 03/02/24  2.  Pt to report compliance with daily exercise of walking or using station bike/elliptical (at home) Baseline:  Goal status: Met 03/02/24    LONG TERM GOALS: Target date: 03/25/24  Pt will improve on her ABC score above 80% to demonstrate a High level of physical function Baseline: 123/16=76% confident Goal status: INITIAL  2.  Pt will be indep and compliant with final HEP's (land and aquatic as appropriate) for continued management of condition Baseline:  Goal status: INITIAL  3.  Pt will improve strength in hips by at least 5 lbs to demonstrate improved overall physical function Baseline:  Goal status: INITIAL  4.  Pt will improve on 5 X STS test to <or= 21s to demonstrate improving functional lower extremity strength, transitional movements, and balance. (MDC = 4.2sec)   Baseline: 26.02 with uncontrolled sit Goal status: INITIAL    PLAN:  PT FREQUENCY: 2x/week  PT DURATION: 6 weeks  PLANNED INTERVENTIONS: [id number]- PT Re-evaluation, 97750- Physical Performance Testing, 97110-Therapeutic exercises, 97530- Therapeutic activity, 97112- Neuromuscular re-education, 97535- Self Care, [id number]- Manual therapy, [phone]- Gait training, [phone]- Orthotic Initial, [phone]- Aquatic Therapy, [phone]- Electrical stimulation (unattended), [phone]- Electrical stimulation (manual), [id number]- Ionotophoresis 4mg /ml Dexamethasone , 79439 (1-2 muscles), [id number] (3+ muscles)- Dry Needling, Patient/Family education, Balance training, Stair training, Taping, Joint mobilization, DME instructions, Cryotherapy, and Moist heat.  PLAN FOR NEXT SESSION: aquatics instruct on hep for le and core strengthening; balance retraining Land: loaded as tolerated  for general strengthening and to encourage improve bone health; HEP updates PRN     Asberry FORBES Rodes, PTA 03/15/2024, 4:03 PM     "

## 2024-03-17 ENCOUNTER — Encounter (HOSPITAL_BASED_OUTPATIENT_CLINIC_OR_DEPARTMENT_OTHER): Payer: Self-pay | Admitting: Physical Therapy

## 2024-03-17 ENCOUNTER — Ambulatory Visit (HOSPITAL_BASED_OUTPATIENT_CLINIC_OR_DEPARTMENT_OTHER): Admitting: Physical Therapy

## 2024-03-17 DIAGNOSIS — M6281 Muscle weakness (generalized): Secondary | ICD-10-CM

## 2024-03-17 DIAGNOSIS — M810 Age-related osteoporosis without current pathological fracture: Secondary | ICD-10-CM

## 2024-03-17 DIAGNOSIS — R2689 Other abnormalities of gait and mobility: Secondary | ICD-10-CM | POA: Diagnosis not present

## 2024-03-17 NOTE — Therapy (Signed)
 " OUTPATIENT PHYSICAL THERAPY THORACOLUMBAR TREATMENT  Progress Note / Re-cert Reporting Period 02/07/25 to 03/17/24  See note below for Objective Data and Assessment of Progress/Goals.     Patient Name: LEKHA DANCER MRN: [id number] DOB:1950-12-31, 74 y.o., female Today's Date: 02/07/2024  END OF SESSION:   PT End of Session - 03/17/24 1534     Visit Number 10    Date for Recertification  05/13/24    Authorization Type aetna medicare    Progress Note Due on Visit 20    PT Start Time 1533    PT Stop Time 1617    PT Time Calculation (min) 44 min    Activity Tolerance Patient tolerated treatment well    Behavior During Therapy [facility] for tasks assessed/performed                  Past Medical History:  Diagnosis Date   Anemia    Anxiety disorder    Basal cell carcinoma (BCC) of left upper arm 12/2015   Basal cell carcinoma (BCC) of right lower leg 04/2016   Borderline hypertension    CAD (coronary artery disease)    a. nonobst by cor CT 12/2018.   CIN II (cervical intraepithelial neoplasia II)    CIN  1   Depression    Eczema    Family history of malignant neoplasm of gastrointestinal tract 08/14/2020   Family history of premature CAD    Hard of hearing    wears hearing aids   History of basal cell carcinoma (BCC) excision    left upper arm 2017;   right lower leg 2018;  bilateral lower leg and hip area 2019;   inner right lower leg, outer right thigh, & upper outside left arm 09/ 2020   History of cervical dysplasia 2013   History of hereditary spherocytosis    s/p splenectomy in 06-10-52   History of hyperparathyroidism    per pt yrs ago was put on mega dose of vit d which caused the hyperparathyroid resolved after stopping vit d   History of pelvic fracture 2013   Ischemic stroke (HCC) 08/07/2023   Left inguinal hernia 01/02/2023   MAI (mycobacterium avium-intracellulare) (HCC)    ? possibly by CT 12/2018   Mild hyperlipidemia    Osteoporosis    PONV  (postoperative nausea and vomiting)    S/P hernia repair 03/02/2023   S/P splenectomy    spherocytosis   Wears glasses    White coat syndrome without diagnosis of hypertension    Past Surgical History:  Procedure Laterality Date   ANKLE SURGERY  03/14/2015   BREAST BIOPSY Left 08/30/2021   CYSTIC APOCRINE METAPLASIA   CERVICAL CONIZATION W/BX N/A 12/13/2018   Procedure: CONIZATION CERVIX WITH BIOPSY;  Surgeon: Cleotilde Ronal RAMAN, MD;  Location: [facility] OR;  Service: Gynecology;  Laterality: N/A;   CYSTOSCOPY N/A 10/18/2019   Procedure: CYSTOSCOPY;  Surgeon: Cleotilde Ronal RAMAN, MD;  Location: [facility];  Service: Gynecology;  Laterality: N/A;   IR RADIOLOGY PERIPHERAL GUIDED IV START  01/05/2019   IR US  GUIDE VASC ACCESS RIGHT  01/05/2019   KNEE SURGERY Left 1991   per pt retained hardward   LEEP N/A 12/13/2018   Procedure: possible LOOP ELECTROSURGICAL EXCISION PROCEDURE (LEEP);  Surgeon: Cleotilde Ronal RAMAN, MD;  Location: [facility] OR;  Service: Gynecology;  Laterality: N/A;   ORIF ANKLE FRACTURE Left 03/14/2015   per pt retained hardware   ORIF PATELLA Right 10/19/2023   Procedure: OPEN  REDUCTION INTERNAL FIXATION (ORIF) PATELLA;  Surgeon: Kay Kemps, MD;  Location: [facility] ORS;  Service: Orthopedics;  Laterality: Right;   ORIF WRIST FRACTURE Left 03/10/2022   Procedure: OPEN REDUCTION INTERNAL FIXATION (ORIF) WRIST FRACTURE;  Surgeon: Shari Easter, MD;  Location: [facility] OR;  Service: Orthopedics;  Laterality: Left;  regional with iv sedation   SPLENECTOMY, TOTAL  1954   due to spherocytosis   TOTAL LAPAROSCOPIC HYSTERECTOMY WITH SALPINGECTOMY N/A 10/18/2019   Procedure: TOTAL LAPAROSCOPIC HYSTERECTOMY WITH BILATERAL SALPINGECTOMY AND BILATERAL OOPHORECTOMY;  Surgeon: Cleotilde Ronal RAMAN, MD;  Location: [facility];  Service: Gynecology;  Laterality: N/A;  BILATERAL SALPINGECTOMY POSS OOPHORECTOMY   TUBAL LIGATION Bilateral 1995   Patient Active Problem List   Diagnosis Date Noted    B12 deficiency 11/25/2023   Internal hemorrhoids 11/25/2023   Anemia 11/25/2023   History of CVA (cerebrovascular accident) 11/10/2023   Cognitive impairment 10/29/2023   At risk for falls 10/29/2023   Right patella fracture 10/19/2023   Carotid artery disease 08/25/2023   Essential (primary) hypertension 08/13/2023   ICAO (internal carotid artery occlusion), right 08/12/2023   PSVT (paroxysmal supraventricular tachycardia) 08/11/2023   Residual cognitive deficit as late effect of cerebrovascular accident 08/09/2023   Lower back pain 01/02/2023   Vitamin D  deficiency 11/24/2022   Aortic atherosclerosis 11/04/2019   HLD (hyperlipidemia) 11/03/2019   CAD (coronary artery disease) 11/03/2019   S/P total hysterectomy and bilateral salpingo-oophorectomy, 2021 11/03/2019   Hearing loss 10/13/2017   History of basal cell carcinoma (BCC) 05/19/2017   Anxiety 10/10/2016   Eczema 10/10/2016   H/O splenectomy for hemolytic spherocytosis 10/10/2016   H/O basal cell carcinoma excision 10/10/2016    PCP: Glade Hope MD  REFERRING PROVIDER: Lorilee Sven SQUIBB, MD   REFERRING DIAG:  M81.0 (ICD-10-CM) - Osteoporosis, unspecified osteoporosis type, unspecified pathological fracture presence  I63.9 (ICD-10-CM) - Cerebrovascular accident (CVA), unspecified mechanism (HCC)  I63.511 (ICD-10-CM) - Right middle cerebral artery stroke (HCC)    Rationale for Evaluation and Treatment: Rehabilitation  THERAPY DIAG:  Muscle weakness Osteoporosis without current pathological fx Other abnormalities of gait and mobility  ONSET DATE: chronic  SUBJECTIVE:                                                                                                                                                                                           SUBJECTIVE STATEMENT: Patient reports she is ab;le to get up from chairs better, with less use of ue, she feels stronger and more balanced     Initial  Subjective Done with rehab for my knee.  Want to increase my core strength and  cardio. I walk around the block and ride stationary bike  a few times a week for about 15 mins. No pain.  PERTINENT HISTORY:  Right patella fx with ORIF 8/25 L wrist fx with ORIF 1/24 L ankle fx 2017  PAIN:  Are you having pain? No 0/10 currently   PRECAUTIONS: Fall  RED FLAGS: None   WEIGHT BEARING RESTRICTIONS: No  FALLS:  Has patient fallen in last 6 months? No  OCCUPATION: retired  PLOF: Independent  PATIENT GOALS: increase core strength, improve bone health  NEXT MD VISIT: as needed  OBJECTIVE:  Note: Objective measures were completed at Evaluation unless otherwise noted.  DIAGNOSTIC FINDINGS:  10/25 MRI lumbar spine (without) demonstrating: - Rotoscoliosis convex right centered at L2.  Mild spondylosis and disc bulging as above.   - No spinal stenosis or foraminal narrowing  MRI thoracic spine (without) demonstrating: - Mild scoliosis, degenerative spondylosis and disc bulging as above.  No spinal stenosis or foraminal narrowing.  PATIENT SURVEYS:  ABC:123/16=76% confident  COGNITION: Overall cognitive status: Within functional limits for tasks assessed       POSTURE: Rotoscoliosis convex right centered at L2    LUMBAR ROM:   WFL  LOWER EXTREMITY ROM:     WFL  LOWER EXTREMITY MMT:    MMT Right eval Left eval R / L 03/17/24  Hip flexion 27.6 27.8 31.5 / 29.6  Hip extension     Hip abduction 19.5 23.2 22.0 / 32.8  Hip adduction     Hip internal rotation     Hip external rotation     Knee flexion     Knee extension 39.4 47.6 40.2 / 41.4  Ankle dorsiflexion     Ankle plantarflexion     Ankle inversion     Ankle eversion      (Blank rows = not tested)   FUNCTIONAL TESTS:  Lars: 44/56 5 x STS: 26.02 uncontrolled sit TUG: 10.87  03/17/24: 5 x STS: 23.95  GAIT: Distance walked: 500 ft Assistive device utilized: None Level of assistance: Complete  Independence Comments: small BOS  TREATMENT  [facility] Adult PT Treatment:                                                 03/17/2024 Pt seen for aquatic therapy today.  Treatment took place in water 3.5-4.75 ft in depth at the [facility] pool. Temp of water was 91.  Pt entered/exited the pool via stairs using alternating step pattern with hand rail.  Testing for PN/recert  *walking forward, back and side stepping in 3.6-4.0 ft unsupported *Tandem stance using RBHB for ue support leading R/L in 3.6 ft holding x 20s->unsupported unsteady leading R/L best try ~8s *SLS ue support RBHB in 3.6 ft static unsteady but with several tries holds for 20s each-> dynamic balance ue add/abd x 5(Difficult/unsteady/ multiple LOB) *hip hinges 2 x 10.  VC and demonstration. 2 x10 good execution. *STS from 3rd water step> VC and demonstration provided.   Pt requires the buoyancy and hydrostatic pressure of water for support, and to offload joints by unweighting joint load by at least 50 % in navel deep water and by at least 75-80% in chest to neck deep water.  Viscosity of the water is needed for resistance of strengthening. Water current perturbations provides challenge to standing balance requiring increased core  activation.    1/20: STS 2x10 Step ups 6 2x10bil Deadlift x10, added 10lb KB 2x10 Standing hip abduction RTB at ankles 2x10 Standing hip extension RTB at ankles 2x10 Leg press cybex 50# 2x10 Leg extension machine 10# 2x10 Tandem balance x20sec each   03/11/24 Squat at rail 2 x 10 Step up 6 inch 2 x 10 Lateral step up and over 6 inch 2 x 10 Standing hip abduction RTB at knees 2 x 10 Dead lift 1 x 10   DATE:  03-11-2024 Pt seen for aquatic therapy today.  Treatment took place in water 3.5-4.75 ft in depth at the [facility] pool. Temp of water was 91.  Pt entered/exited the pool via stairs using alternating step pattern with hand rail.  *walking forward, back and side  stepping in 3.6-4.0 ft unsupported *Side stepping-> ue add/abd yellow yellow *hip hinges 2 x 10.  VC and demonstration. 2 x10 good execution. *Hip hiking on bottom step R/L x 10 with 5s hold *STS from from water bench onto step x 10 with slow decent. *resisted hip extension and abduction using rider band holding @3 -> 4MG  (resistance) *seated resisted hip abd using rider band x10 *decompression position with noodle wrapped  posteriorly across chest: cycling  Pt requires the buoyancy and hydrostatic pressure of water for support, and to offload joints by unweighting joint load by at least 50 % in navel deep water and by at least 75-80% in chest to neck deep water.  Viscosity of the water is needed for resistance of strengthening. Water current perturbations provides challenge to standing balance requiring increased core activation.    03/01/24  Nustep L4x6 minutes BLEs only  Bridges + ABD into red TB x12 Sidelying clams red TB x12 B STS red TB around knees x10 Hip hikes x12 B Encouraged regular wt bearing exercise like a walking program, also educated on benefit of resistance training to help build bone mass and counter osteoporosis/penia   Tandem stance in corner 4x30 seconds alternating Tandem walking at bar minA  Wide tandem on blue foam 4x30 seconds alternating     02/22/24 Pt seen for aquatic therapy today.  Treatment took place in water 3.5-4.75 ft in depth at the [facility] pool. Temp of water was 91.  Pt entered/exited the pool via stairs using alternating step pattern with hand rail.  *walking forward, back and side stepping in 3.6-4.0 ft unsupported *Side stepping-> ue add/abd RBHB-> horizontal add/abd *suitcase carry using RBHB bilaterally then unilaterally marching forward and backward *Step ups bottom step leading R/L ue supported->unsupported x 10 *stair tapping 1st step then 2nd step alternating x 10   *resisted hip abd in seated position using rider band 2  x 10 *resisted side stepping using rider band R/L *traditional squats with hip isometric abd using rider band *1/2 noodle pull down for TrA engagement wide stance than staggered *decompression position with noodle wrapped  posteriorly across chest: cycling  Pt requires the buoyancy and hydrostatic pressure of water for support, and to offload joints by unweighting joint load by at least 50 % in navel deep water and by at least 75-80% in chest to neck deep water.  Viscosity of the water is needed for resistance of strengthening. Water current perturbations provides challenge to standing balance requiring increased core activation.   DATE: 02/17/24  -Nu-step L4 x81min -Sit to stands with green band around thighs  -HR/TR 3x10 -partial squats at rail x10 -standing hip abduction /extension 2# 2x10ea -standing marching 2#  2x10 -standing HSC 2# 2x10ea -LAQ 4# 5 hold 2x10ea -s/l hip abduction 2x10ea                                                                                                                                        PATIENT EDUCATION:  Education details: Discussed eval findings, rehab rationale, aquatic program progression/POC and pools in area. Patient is in agreement  Person educated: Patient Education method: Explanation Education comprehension: verbalized understanding  HOME EXERCISE PROGRAM: Pt following HEP assigned by land therapy at other clinic (recently) DC for rehab patella fx Aquatic tba  Prior HEP from other PT for patella- quad sets, SLRs, SAQs, standing hip ABD, standing marches, standing hip extensions, added below:   Access Code: [email or web address] URL: [email or web address] Date: 02/26/2024 Prepared by: Josette Rough  ASSESSMENT:  CLINICAL IMPRESSION: PN/re-cert: pt demonstrates good progress in strength, balance and transitional movements as demonstrated in testing in above charts.  She reports improvements with STS ability at home as  well as climbing stairs. Her confidence scale not yet improved but anticipate it will as she continues to build strength.  She will continue to benefit from skilled PT intervention in both aquatic and land settings.  Plan to fully transition to land after 4 more weeks when I anticipate she will have reached her max potential in pool setting. Focus today on static and dynamic balance. She had multiple LOB with indep recovery in SLS but demonstrates no inhibition to challenge ability and improve using the safety component of the properties of water. Goals ongoing     Initial Impression Patient is a 74 y.o. f who was seen today for physical therapy evaluation and treatment for OP with past related pathological fx. She has just completed an episode of PT at another clinic s/p Right patella fx with ORIF 8/25. Pt reports at baseline she is not very active but is trying to increase her activity at MD suggestion to improve bone health as since 2017 she has had 3 pathological fxs.  She has not had a recent bone density test completed.  Testing today demonstrates reduced strength in core and hips.  Requires UE assist to rise from chairs.  She is doing some low level exercise a few times a week but as she reports not getting her heart rate up. She will benefit from skilled PT to improve all areas and deficit and instruct on HEP to improve bone health. Plan to see her in aquatics a few times for instruction on water exercise although majority of sessions to be land based for loading to progress bone health.  OBJECTIVE IMPAIRMENTS: decreased activity tolerance and lack of motivation.   ACTIVITY LIMITATIONS: locomotion level  PERSONAL FACTORS: Behavior pattern are also affecting patient's functional outcome.   REHAB POTENTIAL: Good  CLINICAL DECISION MAKING: Stable/uncomplicated  EVALUATION COMPLEXITY: Low   GOALS: Goals reviewed with patient? Yes  SHORT TERM GOALS:  Target date: 03/05/24  Pt will tolerate  full aquatic sessions consistently without increase in pain and with improving function to demonstrate good toleration and effectiveness of intervention.  Baseline: Goal status: Met 03/02/24  2.  Pt to report compliance with daily exercise of walking or using station bike/elliptical (at home) Baseline:  Goal status: Met 03/02/24    LONG TERM GOALS: Target date: 05/13/24  Pt will improve on her ABC score above 80% to demonstrate a High level of physical function Baseline: 123/16=76% confident; 118/16=73% Goal status: In progress 03/17/24  2.  Pt will be indep and compliant with final HEP's (land and aquatic as appropriate) for continued management of condition Baseline:  Goal status: In progress 03/17/24  3.  Pt will improve strength in hips by at least 5 lbs to demonstrate improved overall physical function Baseline:  Goal status: In progress 03/17/24  4.  Pt will improve on 5 X STS test to <or= 21s to demonstrate improving functional lower extremity strength, transitional movements, and balance. (MDC = 4.2sec)   Baseline: 26.02 with uncontrolled sit Goal status: In progress 03/17/24    PLAN:  PT FREQUENCY: 2x/week  PT DURATION: 8 weeks Continue with alternating land and aquatics x 4 weeks then fully transition to land for 4 weeks  PLANNED INTERVENTIONS: 97164- PT Re-evaluation, 97750- Physical Performance Testing, 97110-Therapeutic exercises, 97530- Therapeutic activity, 97112- Neuromuscular re-education, 97535- Self Care, [id number]- Manual therapy, [phone]- Gait training, [phone]- Orthotic Initial, [phone]- Aquatic Therapy, [phone]- Electrical stimulation (unattended), [phone]- Electrical stimulation (manual), [id number]- Ionotophoresis 4mg /ml Dexamethasone , 79439 (1-2 muscles), 20561 (3+ muscles)- Dry Needling, Patient/Family education, Balance training, Stair training, Taping, Joint mobilization, DME instructions, Cryotherapy, and Moist heat.  PLAN FOR NEXT SESSION: aquatics instruct on hep for le and  core strengthening; balance retraining Land: loaded as tolerated for general strengthening and to encourage improve bone health; HEP updates PRN     Ronal Foots) Ngan Qualls MPT 03/17/24 4:53 PM [facility] [address] [place], [place], [address] Phone: [phone]   Fax:  [phone]      "

## 2024-03-18 ENCOUNTER — Ambulatory Visit
Admission: RE | Admit: 2024-03-18 | Discharge: 2024-03-18 | Disposition: A | Source: Ambulatory Visit | Attending: Obstetrics & Gynecology | Admitting: Obstetrics & Gynecology

## 2024-03-18 DIAGNOSIS — Z9189 Other specified personal risk factors, not elsewhere classified: Secondary | ICD-10-CM

## 2024-03-20 ENCOUNTER — Encounter (HOSPITAL_BASED_OUTPATIENT_CLINIC_OR_DEPARTMENT_OTHER): Payer: Self-pay

## 2024-03-21 ENCOUNTER — Ambulatory Visit (HOSPITAL_BASED_OUTPATIENT_CLINIC_OR_DEPARTMENT_OTHER): Admitting: Obstetrics & Gynecology

## 2024-03-22 ENCOUNTER — Ambulatory Visit (HOSPITAL_BASED_OUTPATIENT_CLINIC_OR_DEPARTMENT_OTHER): Admitting: Physical Therapy

## 2024-03-24 ENCOUNTER — Ambulatory Visit (HOSPITAL_BASED_OUTPATIENT_CLINIC_OR_DEPARTMENT_OTHER): Admitting: Physical Therapy

## 2024-03-28 ENCOUNTER — Ambulatory Visit (HOSPITAL_BASED_OUTPATIENT_CLINIC_OR_DEPARTMENT_OTHER): Payer: Self-pay | Admitting: Physical Therapy

## 2024-03-31 ENCOUNTER — Encounter (HOSPITAL_BASED_OUTPATIENT_CLINIC_OR_DEPARTMENT_OTHER): Payer: Self-pay | Attending: Physical Medicine and Rehabilitation | Admitting: Physical Therapy

## 2024-04-06 ENCOUNTER — Ambulatory Visit (HOSPITAL_BASED_OUTPATIENT_CLINIC_OR_DEPARTMENT_OTHER): Payer: Self-pay | Admitting: Physical Therapy

## 2024-04-27 ENCOUNTER — Encounter (HOSPITAL_BASED_OUTPATIENT_CLINIC_OR_DEPARTMENT_OTHER): Admitting: Physical Therapy

## 2024-04-29 ENCOUNTER — Encounter (HOSPITAL_BASED_OUTPATIENT_CLINIC_OR_DEPARTMENT_OTHER): Admitting: Physical Therapy

## 2024-05-02 ENCOUNTER — Encounter (HOSPITAL_BASED_OUTPATIENT_CLINIC_OR_DEPARTMENT_OTHER): Admitting: Physical Therapy

## 2024-05-03 ENCOUNTER — Ambulatory Visit

## 2024-05-04 ENCOUNTER — Encounter (HOSPITAL_BASED_OUTPATIENT_CLINIC_OR_DEPARTMENT_OTHER): Admitting: Physical Therapy

## 2024-05-05 ENCOUNTER — Ambulatory Visit: Admitting: Neurology

## 2024-05-11 ENCOUNTER — Encounter (HOSPITAL_BASED_OUTPATIENT_CLINIC_OR_DEPARTMENT_OTHER): Admitting: Physical Therapy

## 2024-05-13 ENCOUNTER — Encounter (HOSPITAL_BASED_OUTPATIENT_CLINIC_OR_DEPARTMENT_OTHER): Admitting: Physical Therapy

## 2024-05-25 ENCOUNTER — Ambulatory Visit: Admitting: Internal Medicine

## 2024-07-14 ENCOUNTER — Other Ambulatory Visit (HOSPITAL_BASED_OUTPATIENT_CLINIC_OR_DEPARTMENT_OTHER)
